# Patient Record
Sex: Female | Born: 1935 | Race: White | Hispanic: No | Smoking: Current every day smoker
Health system: Southern US, Community
[De-identification: ages and names within clinical notes are randomized; demographics above are authoritative.]

## PROBLEM LIST (undated history)

## (undated) DIAGNOSIS — I739 Peripheral vascular disease, unspecified: Secondary | ICD-10-CM

## (undated) DIAGNOSIS — E1140 Type 2 diabetes mellitus with diabetic neuropathy, unspecified: Secondary | ICD-10-CM

## (undated) DIAGNOSIS — F329 Major depressive disorder, single episode, unspecified: Secondary | ICD-10-CM

## (undated) DIAGNOSIS — N189 Chronic kidney disease, unspecified: Secondary | ICD-10-CM

## (undated) DIAGNOSIS — I519 Heart disease, unspecified: Secondary | ICD-10-CM

## (undated) DIAGNOSIS — E119 Type 2 diabetes mellitus without complications: Secondary | ICD-10-CM

## (undated) DIAGNOSIS — E039 Hypothyroidism, unspecified: Secondary | ICD-10-CM

## (undated) DIAGNOSIS — D631 Anemia in chronic kidney disease: Secondary | ICD-10-CM

## (undated) DIAGNOSIS — J449 Chronic obstructive pulmonary disease, unspecified: Secondary | ICD-10-CM

## (undated) DIAGNOSIS — J45909 Unspecified asthma, uncomplicated: Secondary | ICD-10-CM

## (undated) DIAGNOSIS — E785 Hyperlipidemia, unspecified: Secondary | ICD-10-CM

## (undated) DIAGNOSIS — I10 Essential (primary) hypertension: Secondary | ICD-10-CM

## (undated) DIAGNOSIS — F419 Anxiety disorder, unspecified: Secondary | ICD-10-CM

## (undated) DIAGNOSIS — J439 Emphysema, unspecified: Secondary | ICD-10-CM

## (undated) DIAGNOSIS — G459 Transient cerebral ischemic attack, unspecified: Secondary | ICD-10-CM

## (undated) DIAGNOSIS — I4891 Unspecified atrial fibrillation: Secondary | ICD-10-CM

## (undated) DIAGNOSIS — F32A Depression, unspecified: Secondary | ICD-10-CM

## (undated) DIAGNOSIS — I2510 Atherosclerotic heart disease of native coronary artery without angina pectoris: Secondary | ICD-10-CM

## (undated) DIAGNOSIS — M1990 Unspecified osteoarthritis, unspecified site: Secondary | ICD-10-CM

## (undated) DIAGNOSIS — Z89432 Acquired absence of left foot: Secondary | ICD-10-CM

## (undated) DIAGNOSIS — I509 Heart failure, unspecified: Secondary | ICD-10-CM

## (undated) DIAGNOSIS — G3184 Mild cognitive impairment, so stated: Secondary | ICD-10-CM

## (undated) HISTORY — PX: JOINT REPLACEMENT: SHX530

## (undated) HISTORY — PX: HIP FRACTURE SURGERY: SHX118

## (undated) HISTORY — DX: Depression, unspecified: F32.A

## (undated) HISTORY — PX: APPENDECTOMY: SHX54

## (undated) HISTORY — DX: Chronic obstructive pulmonary disease, unspecified: J44.9

## (undated) HISTORY — DX: Unspecified osteoarthritis, unspecified site: M19.90

## (undated) HISTORY — DX: Hyperlipidemia, unspecified: E78.5

## (undated) HISTORY — DX: Heart disease, unspecified: I51.9

## (undated) HISTORY — DX: Major depressive disorder, single episode, unspecified: F32.9

## (undated) HISTORY — DX: Hypothyroidism, unspecified: E03.9

## (undated) HISTORY — DX: Atherosclerotic heart disease of native coronary artery without angina pectoris: I25.10

## (undated) HISTORY — DX: Anxiety disorder, unspecified: F41.9

---

## 1998-03-24 ENCOUNTER — Emergency Department (HOSPITAL_COMMUNITY): Admission: EM | Admit: 1998-03-24 | Discharge: 1998-03-24 | Payer: Self-pay | Admitting: Emergency Medicine

## 1998-05-01 ENCOUNTER — Ambulatory Visit (HOSPITAL_COMMUNITY): Admission: RE | Admit: 1998-05-01 | Discharge: 1998-05-01 | Payer: Self-pay | Admitting: *Deleted

## 1998-05-13 ENCOUNTER — Emergency Department (HOSPITAL_COMMUNITY): Admission: EM | Admit: 1998-05-13 | Discharge: 1998-05-13 | Payer: Self-pay | Admitting: Emergency Medicine

## 2004-06-22 HISTORY — PX: EYE SURGERY: SHX253

## 2004-08-04 ENCOUNTER — Emergency Department (HOSPITAL_COMMUNITY): Admission: EM | Admit: 2004-08-04 | Discharge: 2004-08-04 | Payer: Self-pay | Admitting: Emergency Medicine

## 2004-08-07 ENCOUNTER — Ambulatory Visit (HOSPITAL_COMMUNITY): Admission: RE | Admit: 2004-08-07 | Discharge: 2004-08-08 | Payer: Self-pay | Admitting: Ophthalmology

## 2011-07-09 ENCOUNTER — Encounter (INDEPENDENT_AMBULATORY_CARE_PROVIDER_SITE_OTHER): Payer: Medicare PPO | Admitting: Ophthalmology

## 2011-07-09 DIAGNOSIS — E11359 Type 2 diabetes mellitus with proliferative diabetic retinopathy without macular edema: Secondary | ICD-10-CM

## 2011-07-09 DIAGNOSIS — H43819 Vitreous degeneration, unspecified eye: Secondary | ICD-10-CM

## 2011-07-09 DIAGNOSIS — E1165 Type 2 diabetes mellitus with hyperglycemia: Secondary | ICD-10-CM

## 2012-07-12 ENCOUNTER — Ambulatory Visit (INDEPENDENT_AMBULATORY_CARE_PROVIDER_SITE_OTHER): Payer: Medicare PPO | Admitting: Ophthalmology

## 2012-09-27 ENCOUNTER — Ambulatory Visit (INDEPENDENT_AMBULATORY_CARE_PROVIDER_SITE_OTHER): Payer: Self-pay | Admitting: Ophthalmology

## 2012-09-27 DIAGNOSIS — H43819 Vitreous degeneration, unspecified eye: Secondary | ICD-10-CM

## 2012-09-27 DIAGNOSIS — E1139 Type 2 diabetes mellitus with other diabetic ophthalmic complication: Secondary | ICD-10-CM

## 2012-09-27 DIAGNOSIS — E11359 Type 2 diabetes mellitus with proliferative diabetic retinopathy without macular edema: Secondary | ICD-10-CM

## 2012-09-27 DIAGNOSIS — H35039 Hypertensive retinopathy, unspecified eye: Secondary | ICD-10-CM

## 2012-09-27 DIAGNOSIS — I10 Essential (primary) hypertension: Secondary | ICD-10-CM

## 2013-10-02 ENCOUNTER — Ambulatory Visit (INDEPENDENT_AMBULATORY_CARE_PROVIDER_SITE_OTHER): Payer: Medicare Other | Admitting: Ophthalmology

## 2013-10-02 DIAGNOSIS — H43819 Vitreous degeneration, unspecified eye: Secondary | ICD-10-CM

## 2013-10-02 DIAGNOSIS — I10 Essential (primary) hypertension: Secondary | ICD-10-CM

## 2013-10-02 DIAGNOSIS — E11359 Type 2 diabetes mellitus with proliferative diabetic retinopathy without macular edema: Secondary | ICD-10-CM

## 2013-10-02 DIAGNOSIS — H35039 Hypertensive retinopathy, unspecified eye: Secondary | ICD-10-CM

## 2013-10-02 DIAGNOSIS — E1139 Type 2 diabetes mellitus with other diabetic ophthalmic complication: Secondary | ICD-10-CM

## 2013-10-02 DIAGNOSIS — E1165 Type 2 diabetes mellitus with hyperglycemia: Secondary | ICD-10-CM

## 2014-08-18 ENCOUNTER — Emergency Department (HOSPITAL_COMMUNITY)
Admission: EM | Admit: 2014-08-18 | Discharge: 2014-08-18 | Disposition: A | Discharge status: Home / Self Care | Payer: Medicare Other | Source: Home / Self Care | Attending: Emergency Medicine | Admitting: Emergency Medicine

## 2014-08-18 ENCOUNTER — Encounter (HOSPITAL_COMMUNITY): Payer: Self-pay | Admitting: Emergency Medicine

## 2014-08-18 DIAGNOSIS — I890 Lymphedema, not elsewhere classified: Secondary | ICD-10-CM

## 2014-08-18 DIAGNOSIS — Z23 Encounter for immunization: Secondary | ICD-10-CM

## 2014-08-18 HISTORY — DX: Unspecified asthma, uncomplicated: J45.909

## 2014-08-18 HISTORY — DX: Essential (primary) hypertension: I10

## 2014-08-18 HISTORY — DX: Type 2 diabetes mellitus without complications: E11.9

## 2014-08-18 MED ORDER — TETANUS-DIPHTH-ACELL PERTUSSIS 5-2.5-18.5 LF-MCG/0.5 IM SUSP
INTRAMUSCULAR | Status: AC
  Filled 2014-08-18: qty 0.5

## 2014-08-18 MED ORDER — TETANUS-DIPHTH-ACELL PERTUSSIS 5-2.5-18.5 LF-MCG/0.5 IM SUSP
0.5000 mL | Freq: Once | INTRAMUSCULAR | Status: AC
  Administered 2014-08-18: 0.5 mL via INTRAMUSCULAR

## 2014-08-18 MED ORDER — CEPHALEXIN 500 MG PO CAPS: 500.0000 mg | ORAL_CAPSULE | Freq: Three times a day (TID) | ORAL | Status: DC

## 2014-08-18 NOTE — ED Notes (Signed)
Bilateral legs red, scaly, scabbing, and drainage.  Family has noticed this for 2-3 weeks

## 2014-08-18 NOTE — ED Provider Notes (Signed)
CSN: [id number]     Arrival date & time 08/18/14  1116 History   First MD Initiated Contact with Patient 08/18/14 1230     Chief Complaint  Patient presents with  . Leg Swelling   (Consider location/radiation/quality/duration/timing/severity/associated sxs/prior Treatment) HPI Comments: Patient presents with chronic bilateral pedal edema/lymphedema with associated weeping of skin, skin breakdown/ulceration, venous stasis discoloration and thickened lichenified plaques. These skin issues have been present for many years. Patient is brought by her family as they are concerned that issue is not being addressed by her endocrinologist. States she sees her endocrinologist every 6 months, however, current reason for presentation has not been evaluated by endocrinologist. Patient denies having a primary care provider.  Last tetanus booster unknown.   The history is provided by the patient and a relative.    Past Medical History  Diagnosis Date  . Hypertension   . Diabetes mellitus without complication   . Asthma    History reviewed. No pertinent past surgical history. No family history on file. History  Substance Use Topics  . Smoking status: Current Every Day Smoker  . Smokeless tobacco: Not on file  . Alcohol Use: Yes   OB History    No data available     Review of Systems  Constitutional: Negative for fever and chills.  Respiratory: Negative for cough, chest tightness, shortness of breath and wheezing.   Cardiovascular: Negative.   Gastrointestinal: Negative.   Skin:       See HPI  All other systems reviewed and are negative.   Allergies  Review of patient's allergies indicates no known allergies.  Home Medications   Prior to Admission medications   Medication Sig Start Date End Date Taking? Authorizing Provider  ALBUTEROL IN Inhale into the lungs.   Yes Historical Provider, MD  Ascorbic Acid (VITAMIN C PO) Take by mouth.   Yes Historical Provider, MD  Cholecalciferol  (VITAMIN D PO) Take by mouth.   Yes Historical Provider, MD  CLONIDINE HCL PO Take by mouth.   Yes Historical Provider, MD  LEVOTHYROXINE SODIUM PO Take by mouth.   Yes Historical Provider, MD  LISINOPRIL PO Take by mouth.   Yes Historical Provider, MD  Multiple Vitamin (MULTIVITAMIN) capsule Take 1 capsule by mouth daily.   Yes Historical Provider, MD  cephALEXin (KEFLEX) 500 MG capsule Take 1 capsule (500 mg total) by mouth 3 (three) times daily. 08/18/14   Jess Barters H Melony Tenpas, PA   BP 158/71 mmHg  Pulse 76  Temp(Src) 97.5 F (36.4 C) (Oral)  Resp 16  SpO2 98% Physical Exam  Constitutional: She is oriented to person, place, and time. She appears well-developed and well-nourished. No distress.  HENT:  Head: Normocephalic and atraumatic.  Cardiovascular: Normal rate.   Pulmonary/Chest: Effort normal.  Neurological: She is alert and oriented to person, place, and time.  Skin:  +weeping 3+ pitting lymphedema of bilateral lower legs with small scattered areas of blistering, ulceration, venous stasis dermatitis and thick lichenified plaques. Scattered areas of erythema without induration and with mild tenderness  Psychiatric: She has a normal mood and affect. Her behavior is normal.  Nursing note and vitals reviewed.   ED Course  Procedures (including critical care time) Labs Review Labs Reviewed - No data to display  Imaging Review No results found.   MDM   1. Lymphedema of both lower extremities    Advised to  keep your skin as clean and as dry wearing long white cotton tube socks with replacement  with fresh socks several times a day. Cephalexin as directed. [facility] requesting that patient becontacted for an appointment at this facility. Advised of importance of locating a primary care doctor to assist in medical management. Given patien a printed sheet listing the primary care doctors in the same group as  endocrinologist. given a tetanus booster here in the clinic today. If she has not heard from the [facility] in the next several days, advised to call for an appointment.     Ria ClockJennifer Lee H Anelisse Jacobson, [place] 08/18/14 [phone]

## 2014-08-18 NOTE — Discharge Instructions (Signed)
Please keep your skin as clean and as dry as you can. Take medication as directed. I have sent an electronic referral to [facility] requesting that you be contacted for an appointment at this facility. In addition, it is very important that you locate a primary care doctor to assist in your medical management. I have given you a printed sheet listing the primary care doctors in the same group as your endocrinologist. You have been given a tetanus booster here in the clinic today. If you have not heard from the [facility] in the next several days, please call for an appointment.  Lymphedema Lymphedema is a swelling caused by the abnormal collection of lymph under the skin. The lymph is fluid from the tissues in your body that travels in the lymphatic system. This system is part of the immune system that includes lymph nodes and vessels. The lymph vessels collect and carry the excess fluid, fats, proteins, and wastes from the tissues of the body to the bloodstream. This system also works to clean and remove bacteria and waste products from the body.  Lymphedema occurs when the lymphatic system is blocked. When the lymph vessels or lymph nodes are blocked or damaged, lymph does not drain properly. This causes abnormal build up of lymph. This leads to swelling in the arms or legs. Lymphedema cannot be cured by medicines. But the swelling can be reduced by physical methods. CAUSES  There are two types of lymphedema. Primary lymphedema is caused by the absence or abnormality of the lymph vessel at birth. It is also known as inherited lymphedema, which occurs rarely. Secondary or acquired lymphedema occurs when the lymph vessel is damaged or blocked. The causes of lymph vessel blockage are:   Skin infection like cellulites.  Infection by parasites (filariasis).  Injury.  Cancer.  Radiation therapy.  Formation of scar tissue.  Surgery. SYMPTOMS  The symptoms of lymphedema  are:  Abnormal swelling of the arm or leg.  Heavy or tight feeling in your arm or leg.  Tight-fitting shoes or rings.  Redness of skin over the affected area.  Limited movement of the affected limb.  Some patients complain about sensitivity to touch and discomfort in the limb(s) affected. You may not have these symptoms immediately following injury. They usually appear within a few days or even years after injury. Inform your caregiver, if you have any of these symptoms. Early treatment can avoid further problems.  DIAGNOSIS  First, your caregiver will inquire about any surgery you have had or medicines you are taking. He will then examine you. Your caregiver may order special imaging tests, such as:  Lymphoscintigraphy (a test in which a low dose of radioactive substance is injected to trace the flow of lymph through the lymph vessels).  MRI (imaging tests using magnetic fields).  Computed tomography (test using special cross-sectional X-rays).  Duplex ultrasound (test using high-frequency sound waves to show the vessels and the blood flow on a screen).  Lymphangiography (special X-ray taken after injecting a contrast dye into the lymph vessel). It is now rarely done. TREATMENT  Lymphedema can be treated in different ways. Your caregiver will decide the type of treatment depending on the cause. Treatment may include:  Exercise: Special exercises will help fluid move out easily from the affected part. This should be done as per your caregiver's advice.  Manual lymph drainage: Gentle massage of the affected limb makes the fluid to move out more freely.  Compression:  Compression stockings or external pump apply pressure over the affected limb. This helps the fluid to move out from the arm or leg. Bandaging can also help to move the fluid out from the affected part. Your caregiver will decide the method that suits you the best.  Medicines: Your caregiver may prescribe antibiotics, if  you have infection.  Surgery: Your caregiver may advise surgery for severe lymphedema. It is reserved for special cases when the patient has difficulty moving. Your surgeon may remove excess tissue from the arm or leg. This will help to ease your movement. Physical therapy may have to be continued after surgery. HOME CARE INSTRUCTIONS  The area is very fragile and is predisposed to injury and infection.  Eat a healthy diet.  Exercise regularly as per advice.  Keep the affected area clean and dry.  Use gloves while cooking or gardening.  Protect your skin from cuts.  Use electric razor to shave the affected area.  Keep affected limb elevated.  Do not wear tight clothes, shoes, or jewelry as it may cause the tissue to be strangled.  Do not use heat pads over the affected area.  Do not sit with cross legs.  Do not walk barefoot.  Do not carry weight on the affected arm.  Avoid having blood pressure checked on the affected limb. SEEK MEDICAL CARE IF:  You continue to have swelling in your limb. SEEK IMMEDIATE MEDICAL CARE IF:   You have high fever.  You have skin rash.  You have chills or sweats.  You have pain or redness.  You have a cut that does not heal. MAKE SURE YOU:   Understand these instructions.  Will watch your condition.  Will get help right away if you are not doing well or get worse. Document Released: 04/05/2007 Document Revised: 05/25/2012 Document Reviewed: 03/11/2009 [facility] Patient Information 2015 [place], [place]. This information is not intended to replace advice given to you by your health care provider. Make sure you discuss any questions you have with your health care provider.

## 2014-09-18 ENCOUNTER — Encounter (HOSPITAL_BASED_OUTPATIENT_CLINIC_OR_DEPARTMENT_OTHER): Payer: Medicare Other | Attending: General Surgery

## 2014-09-18 DIAGNOSIS — E11319 Type 2 diabetes mellitus with unspecified diabetic retinopathy without macular edema: Secondary | ICD-10-CM | POA: Diagnosis not present

## 2014-09-18 DIAGNOSIS — Z794 Long term (current) use of insulin: Secondary | ICD-10-CM | POA: Diagnosis not present

## 2014-09-18 DIAGNOSIS — L97921 Non-pressure chronic ulcer of unspecified part of left lower leg limited to breakdown of skin: Secondary | ICD-10-CM | POA: Diagnosis not present

## 2014-09-18 DIAGNOSIS — L97911 Non-pressure chronic ulcer of unspecified part of right lower leg limited to breakdown of skin: Secondary | ICD-10-CM | POA: Insufficient documentation

## 2014-09-18 DIAGNOSIS — E1140 Type 2 diabetes mellitus with diabetic neuropathy, unspecified: Secondary | ICD-10-CM | POA: Insufficient documentation

## 2014-09-18 DIAGNOSIS — E11622 Type 2 diabetes mellitus with other skin ulcer: Secondary | ICD-10-CM | POA: Diagnosis not present

## 2014-09-25 ENCOUNTER — Encounter (HOSPITAL_BASED_OUTPATIENT_CLINIC_OR_DEPARTMENT_OTHER): Payer: Medicare Other | Attending: General Surgery

## 2014-09-25 DIAGNOSIS — L97921 Non-pressure chronic ulcer of unspecified part of left lower leg limited to breakdown of skin: Secondary | ICD-10-CM | POA: Insufficient documentation

## 2014-09-25 DIAGNOSIS — L97911 Non-pressure chronic ulcer of unspecified part of right lower leg limited to breakdown of skin: Secondary | ICD-10-CM | POA: Insufficient documentation

## 2014-09-25 DIAGNOSIS — J449 Chronic obstructive pulmonary disease, unspecified: Secondary | ICD-10-CM | POA: Diagnosis not present

## 2014-09-25 DIAGNOSIS — E1140 Type 2 diabetes mellitus with diabetic neuropathy, unspecified: Secondary | ICD-10-CM | POA: Diagnosis not present

## 2014-09-25 DIAGNOSIS — E11319 Type 2 diabetes mellitus with unspecified diabetic retinopathy without macular edema: Secondary | ICD-10-CM | POA: Insufficient documentation

## 2014-09-25 DIAGNOSIS — E11622 Type 2 diabetes mellitus with other skin ulcer: Secondary | ICD-10-CM | POA: Diagnosis not present

## 2014-09-25 DIAGNOSIS — J45909 Unspecified asthma, uncomplicated: Secondary | ICD-10-CM | POA: Diagnosis not present

## 2014-09-28 ENCOUNTER — Other Ambulatory Visit (HOSPITAL_BASED_OUTPATIENT_CLINIC_OR_DEPARTMENT_OTHER): Payer: Self-pay | Admitting: General Surgery

## 2014-09-28 ENCOUNTER — Ambulatory Visit (HOSPITAL_COMMUNITY)
Admission: RE | Admit: 2014-09-28 | Discharge: 2014-09-28 | Disposition: A | Discharge status: Home / Self Care | Payer: Medicare Other | Source: Ambulatory Visit | Attending: Vascular Surgery | Admitting: Vascular Surgery

## 2014-09-28 DIAGNOSIS — L97919 Non-pressure chronic ulcer of unspecified part of right lower leg with unspecified severity: Secondary | ICD-10-CM

## 2014-09-28 DIAGNOSIS — L97929 Non-pressure chronic ulcer of unspecified part of left lower leg with unspecified severity: Principal | ICD-10-CM

## 2014-09-28 DIAGNOSIS — I10 Essential (primary) hypertension: Secondary | ICD-10-CM | POA: Insufficient documentation

## 2014-09-28 DIAGNOSIS — F17200 Nicotine dependence, unspecified, uncomplicated: Secondary | ICD-10-CM | POA: Diagnosis not present

## 2014-09-28 DIAGNOSIS — E119 Type 2 diabetes mellitus without complications: Secondary | ICD-10-CM | POA: Diagnosis not present

## 2014-10-02 DIAGNOSIS — E11622 Type 2 diabetes mellitus with other skin ulcer: Secondary | ICD-10-CM | POA: Diagnosis not present

## 2014-10-02 DIAGNOSIS — L97921 Non-pressure chronic ulcer of unspecified part of left lower leg limited to breakdown of skin: Secondary | ICD-10-CM | POA: Diagnosis not present

## 2014-10-02 DIAGNOSIS — E1140 Type 2 diabetes mellitus with diabetic neuropathy, unspecified: Secondary | ICD-10-CM | POA: Diagnosis not present

## 2014-10-02 DIAGNOSIS — E11319 Type 2 diabetes mellitus with unspecified diabetic retinopathy without macular edema: Secondary | ICD-10-CM | POA: Diagnosis not present

## 2014-10-16 DIAGNOSIS — L97921 Non-pressure chronic ulcer of unspecified part of left lower leg limited to breakdown of skin: Secondary | ICD-10-CM | POA: Diagnosis not present

## 2014-10-16 DIAGNOSIS — E11319 Type 2 diabetes mellitus with unspecified diabetic retinopathy without macular edema: Secondary | ICD-10-CM | POA: Diagnosis not present

## 2014-10-16 DIAGNOSIS — E1140 Type 2 diabetes mellitus with diabetic neuropathy, unspecified: Secondary | ICD-10-CM | POA: Diagnosis not present

## 2014-10-16 DIAGNOSIS — E11622 Type 2 diabetes mellitus with other skin ulcer: Secondary | ICD-10-CM | POA: Diagnosis not present

## 2014-10-17 ENCOUNTER — Other Ambulatory Visit: Payer: Self-pay | Admitting: *Deleted

## 2014-10-17 DIAGNOSIS — I739 Peripheral vascular disease, unspecified: Secondary | ICD-10-CM

## 2014-10-30 ENCOUNTER — Encounter (HOSPITAL_BASED_OUTPATIENT_CLINIC_OR_DEPARTMENT_OTHER): Payer: Medicare Other | Attending: General Surgery

## 2014-10-30 DIAGNOSIS — E11622 Type 2 diabetes mellitus with other skin ulcer: Secondary | ICD-10-CM | POA: Insufficient documentation

## 2014-10-30 DIAGNOSIS — E1140 Type 2 diabetes mellitus with diabetic neuropathy, unspecified: Secondary | ICD-10-CM | POA: Diagnosis not present

## 2014-10-30 DIAGNOSIS — E11319 Type 2 diabetes mellitus with unspecified diabetic retinopathy without macular edema: Secondary | ICD-10-CM | POA: Diagnosis not present

## 2014-10-30 DIAGNOSIS — I878 Other specified disorders of veins: Secondary | ICD-10-CM | POA: Insufficient documentation

## 2014-11-02 ENCOUNTER — Encounter (HOSPITAL_COMMUNITY): Payer: Medicare Other

## 2014-11-02 ENCOUNTER — Encounter: Payer: Medicare Other | Admitting: Vascular Surgery

## 2015-01-16 ENCOUNTER — Ambulatory Visit (INDEPENDENT_AMBULATORY_CARE_PROVIDER_SITE_OTHER): Payer: Medicare Other | Admitting: Ophthalmology

## 2015-02-05 ENCOUNTER — Emergency Department (HOSPITAL_COMMUNITY): Payer: Medicare Other

## 2015-02-05 ENCOUNTER — Inpatient Hospital Stay (HOSPITAL_COMMUNITY): Admit: 2015-02-05 | Payer: Medicare Other

## 2015-02-05 ENCOUNTER — Inpatient Hospital Stay (HOSPITAL_COMMUNITY): Payer: Medicare Other

## 2015-02-05 ENCOUNTER — Encounter (HOSPITAL_COMMUNITY)
Admission: EM | Disposition: A | Discharge status: Skilled Nursing Facility | Payer: Self-pay | Source: Home / Self Care | Attending: Internal Medicine

## 2015-02-05 ENCOUNTER — Inpatient Hospital Stay (HOSPITAL_COMMUNITY)
Admission: EM | Admit: 2015-02-05 | Discharge: 2015-02-08 | DRG: 481 | Disposition: A | Discharge status: Skilled Nursing Facility | Payer: Medicare Other | Attending: Internal Medicine | Admitting: Internal Medicine

## 2015-02-05 ENCOUNTER — Encounter (HOSPITAL_COMMUNITY): Payer: Self-pay | Admitting: Emergency Medicine

## 2015-02-05 DIAGNOSIS — Z794 Long term (current) use of insulin: Secondary | ICD-10-CM | POA: Diagnosis not present

## 2015-02-05 DIAGNOSIS — F17210 Nicotine dependence, cigarettes, uncomplicated: Secondary | ICD-10-CM | POA: Diagnosis present

## 2015-02-05 DIAGNOSIS — R9431 Abnormal electrocardiogram [ECG] [EKG]: Secondary | ICD-10-CM | POA: Diagnosis not present

## 2015-02-05 DIAGNOSIS — E1165 Type 2 diabetes mellitus with hyperglycemia: Secondary | ICD-10-CM

## 2015-02-05 DIAGNOSIS — Z7982 Long term (current) use of aspirin: Secondary | ICD-10-CM

## 2015-02-05 DIAGNOSIS — S72141A Displaced intertrochanteric fracture of right femur, initial encounter for closed fracture: Principal | ICD-10-CM | POA: Diagnosis present

## 2015-02-05 DIAGNOSIS — Z9119 Patient's noncompliance with other medical treatment and regimen: Secondary | ICD-10-CM | POA: Diagnosis present

## 2015-02-05 DIAGNOSIS — R0989 Other specified symptoms and signs involving the circulatory and respiratory systems: Secondary | ICD-10-CM

## 2015-02-05 DIAGNOSIS — J45909 Unspecified asthma, uncomplicated: Secondary | ICD-10-CM | POA: Diagnosis present

## 2015-02-05 DIAGNOSIS — M25551 Pain in right hip: Secondary | ICD-10-CM | POA: Diagnosis present

## 2015-02-05 DIAGNOSIS — I2510 Atherosclerotic heart disease of native coronary artery without angina pectoris: Secondary | ICD-10-CM | POA: Diagnosis present

## 2015-02-05 DIAGNOSIS — Z720 Tobacco use: Secondary | ICD-10-CM | POA: Diagnosis present

## 2015-02-05 DIAGNOSIS — S0990XA Unspecified injury of head, initial encounter: Secondary | ICD-10-CM | POA: Diagnosis present

## 2015-02-05 DIAGNOSIS — S72001K Fracture of unspecified part of neck of right femur, subsequent encounter for closed fracture with nonunion: Secondary | ICD-10-CM | POA: Diagnosis not present

## 2015-02-05 DIAGNOSIS — S72001A Fracture of unspecified part of neck of right femur, initial encounter for closed fracture: Secondary | ICD-10-CM | POA: Diagnosis not present

## 2015-02-05 DIAGNOSIS — I10 Essential (primary) hypertension: Secondary | ICD-10-CM | POA: Diagnosis present

## 2015-02-05 DIAGNOSIS — D62 Acute posthemorrhagic anemia: Secondary | ICD-10-CM | POA: Diagnosis not present

## 2015-02-05 DIAGNOSIS — S72009A Fracture of unspecified part of neck of unspecified femur, initial encounter for closed fracture: Secondary | ICD-10-CM | POA: Diagnosis present

## 2015-02-05 DIAGNOSIS — F1020 Alcohol dependence, uncomplicated: Secondary | ICD-10-CM | POA: Diagnosis present

## 2015-02-05 DIAGNOSIS — E039 Hypothyroidism, unspecified: Secondary | ICD-10-CM | POA: Diagnosis present

## 2015-02-05 DIAGNOSIS — Z419 Encounter for procedure for purposes other than remedying health state, unspecified: Secondary | ICD-10-CM

## 2015-02-05 DIAGNOSIS — W1830XA Fall on same level, unspecified, initial encounter: Secondary | ICD-10-CM | POA: Diagnosis present

## 2015-02-05 DIAGNOSIS — E119 Type 2 diabetes mellitus without complications: Secondary | ICD-10-CM | POA: Diagnosis not present

## 2015-02-05 DIAGNOSIS — Z66 Do not resuscitate: Secondary | ICD-10-CM | POA: Diagnosis present

## 2015-02-05 DIAGNOSIS — Z79899 Other long term (current) drug therapy: Secondary | ICD-10-CM | POA: Diagnosis not present

## 2015-02-05 DIAGNOSIS — S72001G Fracture of unspecified part of neck of right femur, subsequent encounter for closed fracture with delayed healing: Secondary | ICD-10-CM | POA: Diagnosis not present

## 2015-02-05 DIAGNOSIS — Z01818 Encounter for other preprocedural examination: Secondary | ICD-10-CM

## 2015-02-05 DIAGNOSIS — S72001D Fracture of unspecified part of neck of right femur, subsequent encounter for closed fracture with routine healing: Secondary | ICD-10-CM | POA: Diagnosis not present

## 2015-02-05 HISTORY — PX: CARDIAC CATHETERIZATION: SHX172

## 2015-02-05 LAB — CBC WITH DIFFERENTIAL/PLATELET
BASOS ABS: 0 10*3/uL (ref 0.0–0.1)
BASOS PCT: 0 % (ref 0–1)
EOS ABS: 0 10*3/uL (ref 0.0–0.7)
EOS PCT: 0 % (ref 0–5)
HCT: 38.6 % (ref 36.0–46.0)
Hemoglobin: 13.1 g/dL (ref 12.0–15.0)
Lymphocytes Relative: 7 % — ABNORMAL LOW (ref 12–46)
Lymphs Abs: 0.9 10*3/uL (ref 0.7–4.0)
MCH: 33.2 pg (ref 26.0–34.0)
MCHC: 33.9 g/dL (ref 30.0–36.0)
MCV: 97.7 fL (ref 78.0–100.0)
Monocytes Absolute: 0.7 10*3/uL (ref 0.1–1.0)
Monocytes Relative: 5 % (ref 3–12)
Neutro Abs: 12.1 10*3/uL — ABNORMAL HIGH (ref 1.7–7.7)
Neutrophils Relative %: 88 % — ABNORMAL HIGH (ref 43–77)
PLATELETS: 202 10*3/uL (ref 150–400)
RBC: 3.95 MIL/uL (ref 3.87–5.11)
RDW: 12.5 % (ref 11.5–15.5)
WBC: 13.8 10*3/uL — AB (ref 4.0–10.5)

## 2015-02-05 LAB — BASIC METABOLIC PANEL
Anion gap: 11 (ref 5–15)
BUN: 11 mg/dL (ref 6–20)
CHLORIDE: 99 mmol/L — AB (ref 101–111)
CO2: 24 mmol/L (ref 22–32)
Calcium: 8.7 mg/dL — ABNORMAL LOW (ref 8.9–10.3)
Creatinine, Ser: 0.59 mg/dL (ref 0.44–1.00)
GFR calc Af Amer: >60 mL/min (ref >60)
GFR calc non Af Amer: >60 mL/min (ref >60)
GLUCOSE: 284 mg/dL — AB (ref 65–99)
POTASSIUM: 3.5 mmol/L (ref 3.5–5.1)
Sodium: 134 mmol/L — ABNORMAL LOW (ref 135–145)

## 2015-02-05 LAB — URINALYSIS, ROUTINE W REFLEX MICROSCOPIC
Bilirubin Urine: NEGATIVE
GLUCOSE, UA: 500 mg/dL — AB
Ketones, ur: 40 mg/dL — AB
LEUKOCYTES UA: NEGATIVE
Nitrite: NEGATIVE
PH: 7.5 (ref 5.0–8.0)
PROTEIN: NEGATIVE mg/dL
SPECIFIC GRAVITY, URINE: 1.014 (ref 1.005–1.030)
Urobilinogen, UA: 0.2 mg/dL (ref 0.0–1.0)

## 2015-02-05 LAB — PROTIME-INR
INR: 1.01 (ref 0.00–1.49)
PROTHROMBIN TIME: 13.5 s (ref 11.6–15.2)

## 2015-02-05 LAB — URINE MICROSCOPIC-ADD ON

## 2015-02-05 LAB — TYPE AND SCREEN
ABO/RH(D): B POS
ANTIBODY SCREEN: NEGATIVE

## 2015-02-05 LAB — GLUCOSE, CAPILLARY
GLUCOSE-CAPILLARY: 328 mg/dL — AB (ref 65–99)
Glucose-Capillary: 223 mg/dL — ABNORMAL HIGH (ref 65–99)
Glucose-Capillary: 165 mg/dL — ABNORMAL HIGH (ref 65–99)

## 2015-02-05 LAB — ABO/RH: ABO/RH(D): B POS

## 2015-02-05 LAB — CBG MONITORING, ED: Glucose-Capillary: 301 mg/dL — ABNORMAL HIGH (ref 65–99)

## 2015-02-05 LAB — POCT ACTIVATED CLOTTING TIME: ACTIVATED CLOTTING TIME: 319 s

## 2015-02-05 SURGERY — LEFT HEART CATH AND CORONARY ANGIOGRAPHY

## 2015-02-05 MED ORDER — PROMETHAZINE HCL 25 MG/ML IJ SOLN: 12.5000 mg | Freq: Four times a day (QID) | INTRAMUSCULAR | Status: DC | PRN

## 2015-02-05 MED ORDER — ONDANSETRON HCL 4 MG/2ML IJ SOLN
4.0000 mg | Freq: Four times a day (QID) | INTRAMUSCULAR | Status: DC | PRN
  Administered 2015-02-05: 4 mg via INTRAVENOUS
  Filled 2015-02-05: qty 2

## 2015-02-05 MED ORDER — HYDROMORPHONE HCL 1 MG/ML IJ SOLN
1.0000 mg | Freq: Once | INTRAMUSCULAR | Status: AC
  Administered 2015-02-05: 1 mg via INTRAVENOUS
  Filled 2015-02-05: qty 1

## 2015-02-05 MED ORDER — SODIUM CHLORIDE 0.9 % IV SOLN
INTRAVENOUS | Status: AC
  Administered 2015-02-05: 13:00:00 via INTRAVENOUS

## 2015-02-05 MED ORDER — LIDOCAINE HCL (PF) 1 % IJ SOLN
INTRAMUSCULAR | Status: DC | PRN
  Administered 2015-02-05: 2 mL via SUBCUTANEOUS

## 2015-02-05 MED ORDER — ALBUTEROL SULFATE (2.5 MG/3ML) 0.083% IN NEBU
2.5000 mg | INHALATION_SOLUTION | RESPIRATORY_TRACT | Status: DC | PRN
  Administered 2015-02-06: 2.5 mg via RESPIRATORY_TRACT
  Filled 2015-02-05: qty 3

## 2015-02-05 MED ORDER — HYDROMORPHONE HCL 1 MG/ML IJ SOLN
0.5000 mg | INTRAMUSCULAR | Status: DC | PRN
  Administered 2015-02-05: 0.5 mg via INTRAVENOUS
  Filled 2015-02-05: qty 1

## 2015-02-05 MED ORDER — SODIUM CHLORIDE 0.9 % IJ SOLN
3.0000 mL | INTRAMUSCULAR | Status: DC | PRN
Start: 2015-02-05 — End: 2015-02-05

## 2015-02-05 MED ORDER — VERAPAMIL HCL 2.5 MG/ML IV SOLN
INTRAVENOUS | Status: DC | PRN
  Administered 2015-02-05: 12:00:00 via INTRA_ARTERIAL

## 2015-02-05 MED ORDER — FENTANYL CITRATE (PF) 100 MCG/2ML IJ SOLN
INTRAMUSCULAR | Status: AC
  Filled 2015-02-05: qty 4

## 2015-02-05 MED ORDER — LIDOCAINE HCL (PF) 1 % IJ SOLN
INTRAMUSCULAR | Status: DC | PRN
  Administered 2015-02-05: 12:00:00

## 2015-02-05 MED ORDER — HYDROCODONE-ACETAMINOPHEN 5-325 MG PO TABS
1.0000 | ORAL_TABLET | Freq: Four times a day (QID) | ORAL | Status: DC | PRN
  Administered 2015-02-06: 1 via ORAL
  Filled 2015-02-05: qty 1

## 2015-02-05 MED ORDER — FENTANYL CITRATE (PF) 100 MCG/2ML IJ SOLN
INTRAMUSCULAR | Status: DC | PRN
  Administered 2015-02-05: 25 ug via INTRAVENOUS

## 2015-02-05 MED ORDER — LIDOCAINE HCL (PF) 1 % IJ SOLN
INTRAMUSCULAR | Status: AC
  Filled 2015-02-05: qty 30

## 2015-02-05 MED ORDER — SODIUM CHLORIDE 0.9 % IV SOLN: 250.0000 mL | INTRAVENOUS | Status: DC | PRN

## 2015-02-05 MED ORDER — VERAPAMIL HCL 2.5 MG/ML IV SOLN
INTRAVENOUS | Status: AC
  Filled 2015-02-05: qty 2

## 2015-02-05 MED ORDER — SODIUM CHLORIDE 0.9 % IV SOLN
INTRAVENOUS | Status: DC
  Administered 2015-02-05: 14:00:00 via INTRAVENOUS

## 2015-02-05 MED ORDER — HYDROMORPHONE HCL 1 MG/ML IJ SOLN
0.5000 mg | INTRAMUSCULAR | Status: DC | PRN
  Filled 2015-02-05: qty 1

## 2015-02-05 MED ORDER — LORAZEPAM 1 MG PO TABS
1.0000 mg | ORAL_TABLET | Freq: Four times a day (QID) | ORAL | Status: DC | PRN
  Administered 2015-02-05 – 2015-02-06 (×2): 1 mg via ORAL
  Filled 2015-02-05 (×4): qty 2

## 2015-02-05 MED ORDER — ADULT MULTIVITAMIN W/MINERALS CH
1.0000 | ORAL_TABLET | Freq: Every day | ORAL | Status: DC
  Administered 2015-02-06 – 2015-02-08 (×3): 1 via ORAL
  Filled 2015-02-05 (×4): qty 1

## 2015-02-05 MED ORDER — NICOTINE 14 MG/24HR TD PT24
14.0000 mg | MEDICATED_PATCH | Freq: Every day | TRANSDERMAL | Status: DC
  Administered 2015-02-05 – 2015-02-08 (×4): 14 mg via TRANSDERMAL
  Filled 2015-02-05 (×4): qty 1

## 2015-02-05 MED ORDER — MIDAZOLAM HCL 2 MG/2ML IJ SOLN
INTRAMUSCULAR | Status: DC | PRN
  Administered 2015-02-05: 1 mg via INTRAVENOUS

## 2015-02-05 MED ORDER — ONDANSETRON HCL 4 MG/2ML IJ SOLN
4.0000 mg | Freq: Once | INTRAMUSCULAR | Status: AC
  Administered 2015-02-05: 4 mg via INTRAVENOUS

## 2015-02-05 MED ORDER — VITAMIN B-1 100 MG PO TABS
100.0000 mg | ORAL_TABLET | Freq: Every day | ORAL | Status: DC
  Administered 2015-02-06 – 2015-02-08 (×3): 100 mg via ORAL
  Filled 2015-02-05 (×4): qty 1

## 2015-02-05 MED ORDER — INSULIN ASPART 100 UNIT/ML ~~LOC~~ SOLN
0.0000 [IU] | SUBCUTANEOUS | Status: DC
  Administered 2015-02-05: 11 [IU] via SUBCUTANEOUS
  Administered 2015-02-05: 5 [IU] via SUBCUTANEOUS
  Administered 2015-02-05 – 2015-02-06 (×2): 3 [IU] via SUBCUTANEOUS
  Administered 2015-02-06 (×3): 5 [IU] via SUBCUTANEOUS
  Administered 2015-02-07: 3 [IU] via SUBCUTANEOUS
  Administered 2015-02-07 (×2): 5 [IU] via SUBCUTANEOUS
  Administered 2015-02-07: 3 [IU] via SUBCUTANEOUS

## 2015-02-05 MED ORDER — SODIUM CHLORIDE 0.9 % IV SOLN
1000.0000 mL | INTRAVENOUS | Status: DC
  Administered 2015-02-05 (×2): 1000 mL via INTRAVENOUS

## 2015-02-05 MED ORDER — CLONIDINE HCL 0.1 MG PO TABS
0.1000 mg | ORAL_TABLET | Freq: Two times a day (BID) | ORAL | Status: DC
  Administered 2015-02-05: 0.1 mg via ORAL
  Filled 2015-02-05 (×3): qty 1

## 2015-02-05 MED ORDER — ADENOSINE (DIAGNOSTIC) 140MCG/KG/MIN
INTRAVENOUS | Status: DC | PRN
  Administered 2015-02-05: 141.337 ug/kg/min via INTRAVENOUS

## 2015-02-05 MED ORDER — INSULIN ASPART 100 UNIT/ML ~~LOC~~ SOLN
SUBCUTANEOUS | Status: AC
  Filled 2015-02-05: qty 1

## 2015-02-05 MED ORDER — ADENOSINE 12 MG/4ML IV SOLN
12.0000 mL | Freq: Once | INTRAVENOUS | Status: DC
  Filled 2015-02-05: qty 12

## 2015-02-05 MED ORDER — HEPARIN SODIUM (PORCINE) 1000 UNIT/ML IJ SOLN
INTRAMUSCULAR | Status: AC
  Filled 2015-02-05: qty 1

## 2015-02-05 MED ORDER — THIAMINE HCL 100 MG/ML IJ SOLN
100.0000 mg | Freq: Every day | INTRAMUSCULAR | Status: DC
  Filled 2015-02-05: qty 1
  Filled 2015-02-05: qty 2
  Filled 2015-02-05: qty 1

## 2015-02-05 MED ORDER — DOCUSATE SODIUM 100 MG PO CAPS
100.0000 mg | ORAL_CAPSULE | Freq: Two times a day (BID) | ORAL | Status: DC
  Administered 2015-02-07 – 2015-02-08 (×2): 100 mg via ORAL
  Filled 2015-02-05 (×5): qty 1

## 2015-02-05 MED ORDER — ACETAMINOPHEN 325 MG PO TABS: 650.0000 mg | ORAL_TABLET | Freq: Four times a day (QID) | ORAL | Status: DC | PRN

## 2015-02-05 MED ORDER — ONDANSETRON HCL 4 MG/2ML IJ SOLN
4.0000 mg | Freq: Once | INTRAMUSCULAR | Status: AC
  Administered 2015-02-05: 4 mg via INTRAVENOUS
  Filled 2015-02-05: qty 2

## 2015-02-05 MED ORDER — HYDROMORPHONE HCL 1 MG/ML IJ SOLN
0.5000 mg | Freq: Once | INTRAMUSCULAR | Status: AC
  Administered 2015-02-05: 0.5 mg via INTRAVENOUS

## 2015-02-05 MED ORDER — POLYETHYLENE GLYCOL 3350 17 G PO PACK: 17.0000 g | PACK | Freq: Every day | ORAL | Status: DC | PRN

## 2015-02-05 MED ORDER — CARVEDILOL 6.25 MG PO TABS
6.2500 mg | ORAL_TABLET | Freq: Two times a day (BID) | ORAL | Status: DC
  Administered 2015-02-05 – 2015-02-08 (×6): 6.25 mg via ORAL
  Filled 2015-02-05: qty 2
  Filled 2015-02-05 (×3): qty 1
  Filled 2015-02-05: qty 2
  Filled 2015-02-05: qty 1

## 2015-02-05 MED ORDER — HEPARIN SODIUM (PORCINE) 1000 UNIT/ML IJ SOLN
INTRAMUSCULAR | Status: DC | PRN
  Administered 2015-02-05: 5000 [IU] via INTRAVENOUS
  Administered 2015-02-05: 3500 [IU] via INTRAVENOUS

## 2015-02-05 MED ORDER — MIDAZOLAM HCL 2 MG/2ML IJ SOLN
INTRAMUSCULAR | Status: AC
  Filled 2015-02-05: qty 4

## 2015-02-05 MED ORDER — FOLIC ACID 1 MG PO TABS
1.0000 mg | ORAL_TABLET | Freq: Every day | ORAL | Status: DC
  Administered 2015-02-06 – 2015-02-08 (×3): 1 mg via ORAL
  Filled 2015-02-05 (×4): qty 1

## 2015-02-05 MED ORDER — LORAZEPAM 2 MG/ML IJ SOLN: 1.0000 mg | Freq: Four times a day (QID) | INTRAMUSCULAR | Status: DC | PRN

## 2015-02-05 MED ORDER — LEVOTHYROXINE SODIUM 25 MCG PO TABS
137.0000 ug | ORAL_TABLET | Freq: Every day | ORAL | Status: DC
  Administered 2015-02-06 – 2015-02-08 (×3): 137 ug via ORAL
  Filled 2015-02-05 (×6): qty 1

## 2015-02-05 MED ORDER — SODIUM CHLORIDE 0.9 % IV SOLN
1000.0000 mL | Freq: Once | INTRAVENOUS | Status: AC
  Administered 2015-02-05: 1000 mL via INTRAVENOUS

## 2015-02-05 MED ORDER — LISINOPRIL 20 MG PO TABS
20.0000 mg | ORAL_TABLET | Freq: Every day | ORAL | Status: DC
  Administered 2015-02-05 – 2015-02-08 (×3): 20 mg via ORAL
  Filled 2015-02-05: qty 1
  Filled 2015-02-05: qty 2
  Filled 2015-02-05: qty 1

## 2015-02-05 MED ORDER — HEPARIN (PORCINE) IN NACL 2-0.9 UNIT/ML-% IJ SOLN
INTRAMUSCULAR | Status: AC
  Filled 2015-02-05: qty 1500

## 2015-02-05 MED ORDER — SODIUM CHLORIDE 0.9 % IJ SOLN: 3.0000 mL | Freq: Two times a day (BID) | INTRAMUSCULAR | Status: DC

## 2015-02-05 SURGICAL SUPPLY — 15 items
CATH INFINITI 5 FR JL3.5 (CATHETERS) ×3 IMPLANT
CATH INFINITI 5FR ANG PIGTAIL (CATHETERS) ×3 IMPLANT
CATH INFINITI JR4 5F (CATHETERS) ×3 IMPLANT
CATH VISTA GUIDE 6FR XBLAD3.5 (CATHETERS) ×2 IMPLANT
DEVICE RAD COMP TR BAND LRG (VASCULAR PRODUCTS) ×3 IMPLANT
GLIDESHEATH SLEND SS 6F .021 (SHEATH) ×3 IMPLANT
GUIDEWIRE PRESSURE COMET II (WIRE) ×2 IMPLANT
KIT ESSENTIALS PG (KITS) ×2 IMPLANT
KIT HEART LEFT (KITS) ×3 IMPLANT
PACK CARDIAC CATHETERIZATION (CUSTOM PROCEDURE TRAY) ×3 IMPLANT
SYR MEDRAD MARK V 150ML (SYRINGE) ×3 IMPLANT
TRANSDUCER W/STOPCOCK (MISCELLANEOUS) ×3 IMPLANT
TUBING CIL FLEX 10 FLL-RA (TUBING) ×3 IMPLANT
WIRE HI TORQ VERSACORE-J 145CM (WIRE) ×2 IMPLANT
WIRE SAFE-T 1.5MM-J .035X260CM (WIRE) ×3 IMPLANT

## 2015-02-05 NOTE — ED Notes (Signed)
[facility] notified of patieant being ready for transportation to [facility] and the patient would probably be going to cath lab when she arrives there. Cardiologist notified Cath Lab of patient's room at [facility].

## 2015-02-05 NOTE — Progress Notes (Signed)
Patient and family called to clarify code status. She wants to remain DNR during this hospitalization. But ok with intubation for surgery. Will change the order.  Tara Flores 5:45 PM

## 2015-02-05 NOTE — ED Provider Notes (Addendum)
CSN: [id number]     Arrival date & time 02/05/15  4098 History   First MD Initiated Contact with Patient 02/05/15 [phone]     Chief Complaint  Patient presents with  . Fall    Patient came from home ([phone] patient was picking something and fell forward and hit head. She also hit knee and hip on her right side.     (Consider location/radiation/quality/duration/timing/severity/associated sxs/prior Treatment) HPI Comments: Pt with hx of DM comes in post mechanical fall at 1 am. Pt called her son who lives nearby. She is unable to ambulate and in severe pain. There is hx of HTN, DM. Not on blood thinners. Pt struck her face with the fall as well. Pt has no pain elsewhere.   Patient is a 79 y.o. female presenting with fall. The history is provided by the patient.  Fall    Past Medical History  Diagnosis Date  . Hypertension   . Diabetes mellitus without complication   . Asthma    History reviewed. No pertinent past surgical history. History reviewed. No pertinent family history. Social History  Substance Use Topics  . Smoking status: Current Every Day Smoker  . Smokeless tobacco: None  . Alcohol Use: Yes   OB History    No data available     Review of Systems  Musculoskeletal: Positive for arthralgias.  All other systems reviewed and are negative.     Allergies  Review of patient's allergies indicates no known allergies.  Home Medications   Prior to Admission medications   Medication Sig Start Date End Date Taking? Authorizing Provider  albuterol (PROVENTIL HFA;VENTOLIN HFA) 108 (90 BASE) MCG/ACT inhaler Inhale 1 puff into the lungs every 6 (six) hours as needed for wheezing or shortness of breath.   Yes Historical Provider, MD  aspirin 325 MG tablet Take 325 mg by mouth every 6 (six) hours as needed for mild pain.   Yes Historical Provider, MD  Cholecalciferol (VITAMIN D PO) Take 2,000 Units by mouth daily.    Yes Historical Provider, MD  cloNIDine  (CATAPRES) 0.2 MG tablet Take 0.2 mg by mouth 2 (two) times daily.   Yes Historical Provider, MD  insulin NPH Human (HUMULIN N,NOVOLIN N) 100 UNIT/ML injection Inject 5-20 Units into the skin 2 (two) times daily before a meal. Take 20 units in the morning 5-6 units taken in the evening   Yes Historical Provider, MD  levothyroxine (SYNTHROID, LEVOTHROID) 137 MCG tablet Take 137 mcg by mouth daily before breakfast.   Yes Historical Provider, MD  lisinopril (PRINIVIL,ZESTRIL) 20 MG tablet Take 20 mg by mouth daily.   Yes Historical Provider, MD  Multiple Vitamins-Minerals (MULTI-BETIC DIABETES) TABS Take 1 tablet by mouth daily.   Yes Historical Provider, MD  nicotine polacrilex (NICORETTE) 4 MG gum Take 4 mg by mouth as needed for smoking cessation.   Yes Historical Provider, MD  ALBUTEROL IN Inhale into the lungs.    Historical Provider, MD  Ascorbic Acid (VITAMIN C PO) Take by mouth.    Historical Provider, MD  cephALEXin (KEFLEX) 500 MG capsule Take 1 capsule (500 mg total) by mouth 3 (three) times daily. Patient not taking: Reported on 02/05/2015 08/18/14   Mathis Fare Presson, PA  CLONIDINE HCL PO Take by mouth.    Historical Provider, MD  LEVOTHYROXINE SODIUM PO Take by mouth.    Historical Provider, MD  LISINOPRIL PO Take by mouth.    Historical Provider, MD  Multiple Vitamin (  MULTIVITAMIN) capsule Take 1 capsule by mouth daily.    Historical Provider, MD   BP 164/67 mmHg  Pulse 86  Temp(Src) 98.4 F (36.9 C) (Oral)  Resp 20  SpO2 97% Physical Exam  Constitutional: She is oriented to person, place, and time. She appears well-developed and well-nourished.  HENT:  Head: Normocephalic and atraumatic.  Eyes: EOM are normal. Pupils are equal, round, and reactive to light.  Neck: Neck supple.  Cardiovascular: Normal rate, regular rhythm and normal heart sounds.   No murmur heard. Pulmonary/Chest: Effort normal. No respiratory distress.  Abdominal: Soft. She exhibits no distension.  There is no tenderness. There is no rebound and no guarding.  Musculoskeletal:  Pt has tenderness over the R hip. RLE is short and foot is facing outwards. OTHERWISE:  Head to toe evaluation shows no hematoma, bleeding of the scalp, no facial abrasions, step offs, crepitus, no tenderness to palpation of the bilateral upper and lower extremities, no gross deformities, no chest tenderness, no pelvic pain.   Neurological: She is alert and oriented to person, place, and time.  Skin: Skin is warm and dry.  Nursing note and vitals reviewed.   ED Course  Procedures (including critical care time) Labs Review Labs Reviewed  CBC WITH DIFFERENTIAL/PLATELET - Abnormal; Notable for the following:    WBC 13.8 (*)    Neutrophils Relative % 88 (*)    Neutro Abs 12.1 (*)    Lymphocytes Relative 7 (*)    All other components within normal limits  BASIC METABOLIC PANEL - Abnormal; Notable for the following:    Sodium 134 (*)    Chloride 99 (*)    Glucose, Bld 284 (*)    Calcium 8.7 (*)    All other components within normal limits  URINALYSIS, ROUTINE W REFLEX MICROSCOPIC (NOT AT [facility]) - Abnormal; Notable for the following:    APPearance CLOUDY (*)    Glucose, UA 500 (*)    Hgb urine dipstick SMALL (*)    Ketones, ur 40 (*)    All other components within normal limits  PROTIME-INR  URINE MICROSCOPIC-ADD ON  TYPE AND SCREEN  ABO/RH    Imaging Review Ct Head Wo Contrast  02/05/2015   CLINICAL DATA:  Fall on blood thinners. Head injury. Initial encounter.  EXAM: CT HEAD WITHOUT CONTRAST  TECHNIQUE: Contiguous axial images were obtained from the base of the skull through the vertex without intravenous contrast.  COMPARISON:  None.  FINDINGS: Skull and Sinuses:Lucency in the right low occipital bone is most consistent with a vascular channel. No acute fracture suspected.  Patchy mucosal thickening in the bilateral paranasal sinuses, greatest in the ethmoids. No sinus effusion.  Orbits: No traumatic  finding.  Bilateral cataract resection.  Brain: No evidence of intracranial injury. No evidence of acute infarction, hemorrhage, hydrocephalus, or mass lesion/mass effect. Chronic small-vessel disease with ischemic gliosis throughout the periventricular white matter. Remote lacunar infarct present in the left centrum semiovale. Cortical atrophy, mild for age.  IMPRESSION: 1. No intracranial injury or fracture. 2. Cortical atrophy and moderate chronic small vessel disease. 3. Chronic sinusitis.   Electronically Signed   By: Marnee Spring M.D.   On: 02/05/2015 06:47   Dg Knee Complete 4 Views Right  02/05/2015   CLINICAL DATA:  Larey Seat while picking up a pillow, landed on RIGHT side. RIGHT knee pain and swelling.  EXAM: RIGHT KNEE - COMPLETE 4+ VIEW  COMPARISON:  None.  FINDINGS: Limited examination due to patient positioning. No acute  fracture deformity. No dislocation. Mild suspected medial compartment osteoarthrosis. Moderate vascular calcifications. Soft tissue planes are nonsuspicious.  IMPRESSION: No acute fracture deformity or dislocation.  Osteopenia, decreasing sensitivity for acute nondisplaced fractures.   Electronically Signed   By: Awilda Metro M.D.   On: 02/05/2015 05:02   Dg Hip Unilat  With Pelvis 2-3 Views Right  02/05/2015   CLINICAL DATA:  Larey Seat while picking up a pillow, landed on RIGHT side. RIGHT knee pain and swelling.  EXAM: DG HIP (WITH OR WITHOUT PELVIS) 2-3V RIGHT  COMPARISON:  None.  FINDINGS: Oblique nondisplaced RIGHT femur intertrochanteric fracture extending through the greater and lesser trochanter. Femoral heads are well formed and located. No dislocation. No destructive bony lesions. Small RIGHT hip effusion. Moderate aortoiliac vascular calcifications.  IMPRESSION: Nondisplaced acute RIGHT femur intertrochanteric fracture. No dislocation.   Electronically Signed   By: Awilda Metro M.D.   On: 02/05/2015 05:05   I, Briyah Wheelwright, personally reviewed and evaluated  these images and lab results as part of my medical decision-making.   EKG Interpretation   Date/Time:  Tuesday February 05 2015 05:27:33 EDT Ventricular Rate:  97 PR Interval:  199 QRS Duration: 81 QT Interval:  374 QTC Calculation: 475 R Axis:   0 Text Interpretation:  Sinus rhythm Consider left atrial enlargement  Anteroseptal infarct, old Repol abnrm suggests ischemia, diffuse leads  inferior and lateral ST depression No old tracing to compare Confirmed by  Rhunette Croft, MD, Janey Genta [phone]) on 02/05/2015 5:37:31 AM      MDM   Final diagnoses:  Fracture neck of femur, right, closed, initial encounter    Pt with a mechanical fall. She has a hip fx. We will admit pt to hospitalist, ortho consulted.   Derwood Kaplan, MD  7:39 AM  Dr. Roda Shutters - pt to be transferred [facility]. Pt to OR tomorrow  02/05/15 6045  Derwood Kaplan, MD 02/05/15 [phone]

## 2015-02-05 NOTE — ED Notes (Signed)
Patient came from home ([phone] patient was picking something and fell forward and hit head. She also hit knee and hip on her right side.

## 2015-02-05 NOTE — ED Notes (Signed)
[facility] notified.   

## 2015-02-05 NOTE — H&P (Addendum)
[facility] History and Physical  Tara Flores QJJ:[id number] DOB: November 21, 1935 DOA: 02/05/2015   PCP: Does not have a PCP Specialists: Followed by Dr. Chalmers Cater with endocrinology who prescribes her other medications as well.  Chief Complaint: Pain in the right hip  HPI: Tara Flores is a 78 y.o. female with a past medical history of diabetes on insulin, hypertension, hypothyroidism, tobacco abuse, alcohol use on a daily basis, who was in her usual state of health until earlier this morning when she bent down to pick up a pillow from the floor, lost her balance and fell. She hit the right side of her face and the right hip on the floor. She couldn't stand up. She had excruciating pain in the right hip. She denies any loss of consciousness. No chest pain or shortness of breath, nausea, vomiting. No dizziness or lightheadedness. No new medications recently. She was brought into the hospital for further management. She was found to have a hip fracture. We were consulted for preoperative evaluation.  Home Medications: Prior to Admission medications   Medication Sig Start Date End Date Taking? Authorizing Provider  albuterol (PROVENTIL HFA;VENTOLIN HFA) 108 (90 BASE) MCG/ACT inhaler Inhale 1 puff into the lungs every 6 (six) hours as needed for wheezing or shortness of breath.   Yes Historical Provider, MD  aspirin 325 MG tablet Take 325 mg by mouth every 6 (six) hours as needed for mild pain.   Yes Historical Provider, MD  Cholecalciferol (VITAMIN D PO) Take 2,000 Units by mouth daily.    Yes Historical Provider, MD  cloNIDine (CATAPRES) 0.2 MG tablet Take 0.2 mg by mouth 2 (two) times daily.   Yes Historical Provider, MD  insulin NPH Human (HUMULIN N,NOVOLIN N) 100 UNIT/ML injection Inject 5-20 Units into the skin 2 (two) times daily before a meal. Take 20 units in the morning 5-6 units taken in the evening   Yes Historical Provider, MD  levothyroxine (SYNTHROID, LEVOTHROID) 137 MCG tablet  Take 137 mcg by mouth daily before breakfast.   Yes Historical Provider, MD  lisinopril (PRINIVIL,ZESTRIL) 20 MG tablet Take 20 mg by mouth daily.   Yes Historical Provider, MD  Multiple Vitamins-Minerals (MULTI-BETIC DIABETES) TABS Take 1 tablet by mouth daily.   Yes Historical Provider, MD  nicotine polacrilex (NICORETTE) 4 MG gum Take 4 mg by mouth as needed for smoking cessation.   Yes Historical Provider, MD  ALBUTEROL IN Inhale into the lungs.    Historical Provider, MD  Ascorbic Acid (VITAMIN C PO) Take by mouth.    Historical Provider, MD  cephALEXin (KEFLEX) 500 MG capsule Take 1 capsule (500 mg total) by mouth 3 (three) times daily. Patient not taking: Reported on 02/05/2015 08/18/14   Audelia Hives Presson, PA  CLONIDINE HCL PO Take by mouth.    Historical Provider, MD  LEVOTHYROXINE SODIUM PO Take by mouth.    Historical Provider, MD  LISINOPRIL PO Take by mouth.    Historical Provider, MD  Multiple Vitamin (MULTIVITAMIN) capsule Take 1 capsule by mouth daily.    Historical Provider, MD    Allergies: No Known Allergies  Past Medical History: Past Medical History  Diagnosis Date  . Hypertension   . Diabetes mellitus without complication   . Asthma     History reviewed. No pertinent past surgical history.  Social History: She lives by herself. Uses a cane to walk outside, but otherwise independent with daily activities. She smokes 1-1/2-2 packs of cigarettes on a daily basis. Drinks  2 glasses of wine on a nightly basis. Denies any beer intake or liquor intake. Denies any illicit drug use.  Family History:  Family History  Problem Relation Age of Onset  . Diabetes Brother      Review of Systems - History obtained from the patient General ROS: negative Psychological ROS: negative Ophthalmic ROS: negative ENT ROS: negative Allergy and Immunology ROS: negative Hematological and Lymphatic ROS: negative Endocrine ROS: negative Respiratory ROS: occ SOB with  ambulation Cardiovascular ROS: as above Gastrointestinal ROS: no abdominal pain, change in bowel habits, or black or bloody stools Genito-Urinary ROS: no dysuria, trouble voiding, or hematuria Musculoskeletal ROS: negative Neurological ROS: no TIA or stroke symptoms Dermatological ROS: negative  Physical Examination  Filed Vitals:   02/05/15 0900 02/05/15 0930 02/05/15 1000 02/05/15 1137  BP: 170/74 161/75 152/77   Pulse: 96 92 93   Temp:      TempSrc:      Resp: _0 SpO2: 97% 97% 100% 100%    BP 152/77 mmHg  Pulse 93  Temp(Src) 98.4 F (36.9 C) (Oral)  Resp 20  SpO2 100%  General appearance: alert, cooperative, appears stated age and no distress Head: Normocephalic, without obvious abnormality, atraumatic Eyes: conjunctivae/corneas clear. PERRL, EOM's intact.  Throat: lips, mucosa, and tongue normal; teeth and gums normal Neck: no adenopathy, no carotid bruit, no JVD, supple, symmetrical, trachea midline and thyroid not enlarged, symmetric, no tenderness/mass/nodules Resp: Coarse breath sounds bilaterally without any wheezing or crackles. Cardio: regular rate and rhythm, S1, S2 normal, no murmur, click, rub or gallop GI: soft, non-tender; bowel sounds normal; no masses,  no organomegaly Extremities: Right lower extremity is externally rotated Pulses: 2+ and symmetric Skin: Skin color, texture, turgor normal. No rashes or lesions Lymph nodes: Cervical, supraclavicular, and axillary nodes normal. Neurologic: No focal deficits  Laboratory Data: Results for orders placed or performed during the hospital encounter of 02/05/15 (from the past 48 hour(s))  Urinalysis, Routine w reflex microscopic (not at [facility])     Status: Abnormal   Collection Time: 02/05/15  5:28 AM  Result Value Ref Range   Color, Urine YELLOW YELLOW   APPearance CLOUDY (A) CLEAR   Specific Gravity, Urine 1.014 1.005 - 1.030   pH 7.5 5.0 - 8.0   Glucose, UA 500 (A) NEGATIVE mg/dL   Hgb urine  dipstick SMALL (A) NEGATIVE   Bilirubin Urine NEGATIVE NEGATIVE   Ketones, ur 40 (A) NEGATIVE mg/dL   Protein, ur NEGATIVE NEGATIVE mg/dL   Urobilinogen, UA 0.2 0.0 - 1.0 mg/dL   Nitrite NEGATIVE NEGATIVE   Leukocytes, UA NEGATIVE NEGATIVE  Urine microscopic-add on     Status: None   Collection Time: 02/05/15  5:28 AM  Result Value Ref Range   Squamous Epithelial / LPF RARE RARE   WBC, UA 0-2 <3 WBC/hpf   RBC / HPF 3-6 <3 RBC/hpf   Bacteria, UA RARE RARE  ABO/Rh     Status: None   Collection Time: 02/05/15  5:30 AM  Result Value Ref Range   ABO/RH(D) B POS   CBC WITH DIFFERENTIAL     Status: Abnormal   Collection Time: 02/05/15  5:38 AM  Result Value Ref Range   WBC 13.8 (H) 4.0 - 10.5 K/uL   RBC 3.95 3.87 - 5.11 MIL/uL   Hemoglobin 13.1 12.0 - 15.0 g/dL   HCT 38.6 36.0 - 46.0 %   MCV 97.7 78.0 - 100.0 fL   MCH 33.2 26.0 - 34.0  pg   MCHC 33.9 30.0 - 36.0 g/dL   RDW 12.5 11.5 - 15.5 %   Platelets 202 150 - 400 K/uL   Neutrophils Relative % 88 (H) 43 - 77 %   Neutro Abs 12.1 (H) 1.7 - 7.7 K/uL   Lymphocytes Relative 7 (L) 12 - 46 %   Lymphs Abs 0.9 0.7 - 4.0 K/uL   Monocytes Relative 5 3 - 12 %   Monocytes Absolute 0.7 0.1 - 1.0 K/uL   Eosinophils Relative 0 0 - 5 %   Eosinophils Absolute 0.0 0.0 - 0.7 K/uL   Basophils Relative 0 0 - 1 %   Basophils Absolute 0.0 0.0 - 0.1 K/uL  Protime-INR     Status: None   Collection Time: 02/05/15  5:38 AM  Result Value Ref Range   Prothrombin Time 13.5 11.6 - 15.2 seconds   INR 1.01 0.00 - 1.49  Type and screen     Status: None   Collection Time: 02/05/15  5:38 AM  Result Value Ref Range   ABO/RH(D) B POS    Antibody Screen NEG    Sample Expiration 63/89/3734   Basic metabolic panel     Status: Abnormal   Collection Time: 02/05/15  5:38 AM  Result Value Ref Range   Sodium 134 (L) 135 - 145 mmol/L   Potassium 3.5 3.5 - 5.1 mmol/L   Chloride 99 (L) 101 - 111 mmol/L   CO2 24 22 - 32 mmol/L   Glucose, Bld 284 (H) 65 - 99 mg/dL    BUN 11 6 - 20 mg/dL   Creatinine, Ser 0.59 0.44 - 1.00 mg/dL   Calcium 8.7 (L) 8.9 - 10.3 mg/dL   GFR calc non Af Amer >60 >60 mL/min   GFR calc Af Amer >60 >60 mL/min    Comment: (NOTE) The eGFR has been calculated using the CKD EPI equation. This calculation has not been validated in all clinical situations. eGFR's persistently <60 mL/min signify possible Chronic Kidney Disease.    Anion gap 11 5 - 15    Radiology Reports: Ct Head Wo Contrast  02/05/2015   CLINICAL DATA:  Fall on blood thinners. Head injury. Initial encounter.  EXAM: CT HEAD WITHOUT CONTRAST  TECHNIQUE: Contiguous axial images were obtained from the base of the skull through the vertex without intravenous contrast.  COMPARISON:  None.  FINDINGS: Skull and Sinuses:Lucency in the right low occipital bone is most consistent with a vascular channel. No acute fracture suspected.  Patchy mucosal thickening in the bilateral paranasal sinuses, greatest in the ethmoids. No sinus effusion.  Orbits: No traumatic finding.  Bilateral cataract resection.  Brain: No evidence of intracranial injury. No evidence of acute infarction, hemorrhage, hydrocephalus, or mass lesion/mass effect. Chronic small-vessel disease with ischemic gliosis throughout the periventricular white matter. Remote lacunar infarct present in the left centrum semiovale. Cortical atrophy, mild for age.  IMPRESSION: 1. No intracranial injury or fracture. 2. Cortical atrophy and moderate chronic small vessel disease. 3. Chronic sinusitis.   Electronically Signed   By: Monte Fantasia M.D.   On: 02/05/2015 06:47   Dg Chest Port 1 View  02/05/2015   CLINICAL DATA:  Pre-operative evaluation for hip fracture.  EXAM: PORTABLE CHEST - 1 VIEW  COMPARISON:  08/07/2004  FINDINGS: Prominent central vascular markings with some peribronchial thickening. No focal airspace disease. Heart size is within normal limits. Slightly low lung volumes compared to the previous examination. The  trachea is midline. Negative for a pneumothorax.  Bony thorax is intact. There appears to be a 5 mm nodule at the left lung base which is likely stable from 2006.  IMPRESSION: Prominent central vascular structures with mild peribronchial thickening. Findings are suggestive for vascular congestion or mild edema.   Electronically Signed   By: Markus Daft M.D.   On: 02/05/2015 09:02   Dg Knee Complete 4 Views Right  02/05/2015   CLINICAL DATA:  [place] while picking up a pillow, landed on RIGHT side. RIGHT knee pain and swelling.  EXAM: RIGHT KNEE - COMPLETE 4+ VIEW  COMPARISON:  None.  FINDINGS: Limited examination due to patient positioning. No acute fracture deformity. No dislocation. Mild suspected medial compartment osteoarthrosis. Moderate vascular calcifications. Soft tissue planes are nonsuspicious.  IMPRESSION: No acute fracture deformity or dislocation.  Osteopenia, decreasing sensitivity for acute nondisplaced fractures.   Electronically Signed   By: Elon Alas M.D.   On: 02/05/2015 05:02   Dg Hip Unilat  With Pelvis 2-3 Views Right  02/05/2015   CLINICAL DATA:  [place] while picking up a pillow, landed on RIGHT side. RIGHT knee pain and swelling.  EXAM: DG HIP (WITH OR WITHOUT PELVIS) 2-3V RIGHT  COMPARISON:  None.  FINDINGS: Oblique nondisplaced RIGHT femur intertrochanteric fracture extending through the greater and lesser trochanter. Femoral heads are well formed and located. No dislocation. No destructive bony lesions. Small RIGHT hip effusion. Moderate aortoiliac vascular calcifications.  IMPRESSION: Nondisplaced acute RIGHT femur intertrochanteric fracture. No dislocation.   Electronically Signed   By: Elon Alas M.D.   On: 02/05/2015 05:05    My interpretation of Electrocardiogram: Sinus rhythm at 97 bpm. normal axis. Normal intervals. No Q waves. ST depression seen in inferior leads. Also seen in V3 to V6. No older EKGs available for comparison.  Problem List  Principal Problem:    Hip fracture Active Problems:   Closed right hip fracture   Abnormal EKG   Tobacco abuse   DM2 (diabetes mellitus, type 2)   Essential hypertension   Hypothyroidism   Assessment: This is a 79 year old Caucasian female who presented after a mechanical fall and sustained a right hip fracture. She has diabetes, hypertension, hypothyroidism. She is a heavy smoker. Also drinks wine on a daily basis. EKG is remarkably abnormal. We don't have an older one for comparison. She denies any chest pain with ambulation but does admit to shortness of breath. According to the daughter, patient had an episode of left arm pain with diaphoresis a few weeks ago.  Plan: #1 Preoperative evaluation: EKG is remarkably abnormal. She has multiple risk factors for coronary artery disease in the form of diabetes, hypertension and tobacco abuse. There is no family history of same. Her functional capacity is low to moderate. She will be undergoing an intermediate risk procedure for hip fracture. At this time she needs further cardiac workup. Cardiology has been consulted. She also is a heavy smoker and likely has COPD. She is at risk for prolonged intubation. She will need pulmonary toilet postoperatively including incentive spirometry. Vascular congestion noted on chest x-ray. No clinical concern for pulmonary edema. Echocardiogram to be considered. Will defer to cardiology.  #2 Tobacco abuse: Nicotine patch will be prescribed. She will need counseling  #3 Alcohol use: She drinks 2 glasses of wine every night. There are times when she doesn't drink. History was somewhat vague. She absolutely denies any beer or liquor intake. We will monitor her closely for signs and symptoms of withdrawal.  #4 diabetes mellitus type 2 on  insulin: Sliding scale coverage will be initiated. Check HbA1c. Hold her insulin Nfor now.  #5 history of essential hypertension: She is noted to be on clonidine. We will continue it at a lower dose.  Holding parameters. Monitor blood pressures closely.  #6 Right hip fracture: Orthopedics has been consulted. Dr. Erlinda Hong has been made aware of her preoperative evaluation and plan for cardiac workup. He will continue to follow. Surgery tentatively scheduled for tomorrow.  ADDENDUM Cardiology has seen the patient. They are also concerned by EKG findings. Plan is for cardiac catheterization today. This has been communicated to Dr. Erlinda Hong with orthopedics.   DVT Prophylaxis: SCDs for now. Definitive prophylaxis postprocedure. Code Status: PATIENT HAD INITIALLY RESCINDED HER DNR FOR THIS HOSPITALIZATION. **PLEASE SEE MY OTHER NOTE AS WELL**. NOW SHE WANTS TO BE DNR BUT OK WITH INTUBATION FOR SURGERY. Family Communication: Discussed with the patient and her daughters  Disposition Plan: Transferred to [facility].   Further management decisions will depend on results of further testing and patient's response to treatment.   [facility]  [facility] Pager [phone]  If 7PM-7AM, please contact night-coverage [email or web address] Password [facility]  02/05/2015, 11:44 AM

## 2015-02-05 NOTE — Consult Note (Signed)
Patient ID: NASHAY BRICKLEY MRN: [id number], DOB/AGE: June 01, 1936   Admit date: 02/05/2015   Primary Physician: No primary care provider on file. Primary Cardiologist: New  Pt. Profile:  79 year old female with no known history of coronary disease but with multiple risk factors including hypertension, diabetes and tobacco abuse, admitted for hip fracture in the setting of mechanical fall, being evaluated for surgical clearance.   Problem List  Past Medical History  Diagnosis Date  . Hypertension   . Diabetes mellitus without complication   . Asthma     History reviewed. No pertinent past surgical history.   Allergies  No Known Allergies  HPI  This is a 78 year old female with no known history of coronary disease but with multiple risk factors including hypertension, insulin dependent diabetes and a 60 + yr h/o tobacco abuse, admitted for right hip fracture in the setting of mechanical fall, now being evaluated for surgical clearance.  Her EKG demonstrates diffuse ST depressions in both the inferior and lateral leads. There are no prior EKGs to compare.   She herself denies any history of chest pain, however her daughters report that she has complained of at least 2 episodes of severe left sided chest pain radiating to her back in the last year. Both episodes were also associated with diaphoresis. Her family urged her to seek medical attention both times, however she refused. She also has limited exercise capacity due to dyspnea on exertion. Her family notes that she is not followed regularly by a PCP. Dr. Romero Belling manages her insulin. She continues to smoke ~2ppd.      Home Medications  Prior to Admission medications   Medication Sig Start Date End Date Taking? Authorizing Provider  albuterol (PROVENTIL HFA;VENTOLIN HFA) 108 (90 BASE) MCG/ACT inhaler Inhale 1 puff into the lungs every 6 (six) hours as needed for wheezing or shortness of breath.   Yes Historical Provider, MD    aspirin 325 MG tablet Take 325 mg by mouth every 6 (six) hours as needed for mild pain.   Yes Historical Provider, MD  Cholecalciferol (VITAMIN D PO) Take 2,000 Units by mouth daily.    Yes Historical Provider, MD  cloNIDine (CATAPRES) 0.2 MG tablet Take 0.2 mg by mouth 2 (two) times daily.   Yes Historical Provider, MD  insulin NPH Human (HUMULIN N,NOVOLIN N) 100 UNIT/ML injection Inject 5-20 Units into the skin 2 (two) times daily before a meal. Take 20 units in the morning 5-6 units taken in the evening   Yes Historical Provider, MD  levothyroxine (SYNTHROID, LEVOTHROID) 137 MCG tablet Take 137 mcg by mouth daily before breakfast.   Yes Historical Provider, MD  lisinopril (PRINIVIL,ZESTRIL) 20 MG tablet Take 20 mg by mouth daily.   Yes Historical Provider, MD  Multiple Vitamins-Minerals (MULTI-BETIC DIABETES) TABS Take 1 tablet by mouth daily.   Yes Historical Provider, MD  nicotine polacrilex (NICORETTE) 4 MG gum Take 4 mg by mouth as needed for smoking cessation.   Yes Historical Provider, MD  ALBUTEROL IN Inhale into the lungs.    Historical Provider, MD  Ascorbic Acid (VITAMIN C PO) Take by mouth.    Historical Provider, MD  cephALEXin (KEFLEX) 500 MG capsule Take 1 capsule (500 mg total) by mouth 3 (three) times daily. Patient not taking: Reported on 02/05/2015 08/18/14   Mathis Fare Presson, PA  CLONIDINE HCL PO Take by mouth.    Historical Provider, MD  LEVOTHYROXINE SODIUM PO Take by mouth.    Historical  Provider, MD  LISINOPRIL PO Take by mouth.    Historical Provider, MD  Multiple Vitamin (MULTIVITAMIN) capsule Take 1 capsule by mouth daily.    Historical Provider, MD    Family History  Family History  Problem Relation Age of Onset  . Diabetes Brother     Social History  Social History   Social History  . Marital Status: Widowed    Spouse Name: N/A  . Number of Children: N/A  . Years of Education: N/A   Occupational History  . Not on file.   Social History Main  Topics  . Smoking status: Current Every Day Smoker -- 2.00 packs/day for 60 years  . Smokeless tobacco: Not on file  . Alcohol Use: Yes  . Drug Use: No  . Sexual Activity: Not on file   Other Topics Concern  . Not on file   Social History Narrative     Review of Systems General:  No chills, fever, night sweats or weight changes.  Cardiovascular:  No chest pain, dyspnea on exertion, edema, orthopnea, palpitations, paroxysmal nocturnal dyspnea. Dermatological: No rash, lesions/masses Respiratory: No cough, dyspnea Urologic: No hematuria, dysuria Abdominal:   No nausea, vomiting, diarrhea, bright red blood per rectum, melena, or hematemesis Neurologic:  No visual changes, wkns, changes in mental status. All other systems reviewed and are otherwise negative except as noted above.  Physical Exam  Blood pressure 177/84, pulse 92, temperature 98.4 F (36.9 C), temperature source Oral, resp. rate 20, SpO2 96 %.  General: Pleasant, NAD Psych: Normal affect. Neuro: Alert and oriented X 3. Limited mobility of right hip. HEENT: Normal  Neck: Supple without bruits or JVD. Lungs:  Resp regular and unlabored, CTA. Heart: RRR no s3, s4, or murmurs. Abdomen: Soft, non-tender, non-distended, BS + x 4.  Extremities: No clubbing, cyanosis or edema. DP/PT/Radials 2+ and equal bilaterally.  Labs  Troponin (Point of Care Test) No results for input(s): TROPIPOC in the last 72 hours. No results for input(s): CKTOTAL, CKMB, TROPONINI in the last 72 hours. Lab Results  Component Value Date   WBC 13.8* 02/05/2015   HGB 13.1 02/05/2015   HCT 38.6 02/05/2015   MCV 97.7 02/05/2015   PLT 202 02/05/2015     Recent Labs Lab 02/05/15 0538  NA 134*  K 3.5  CL 99*  CO2 24  BUN 11  CREATININE 0.59  CALCIUM 8.7*  GLUCOSE 284*   No results found for: CHOL, HDL, LDLCALC, TRIG No results found for: DDIMER   Radiology/Studies  Ct Head Wo Contrast  02/05/2015   CLINICAL DATA:  Fall on blood  thinners. Head injury. Initial encounter.  EXAM: CT HEAD WITHOUT CONTRAST  TECHNIQUE: Contiguous axial images were obtained from the base of the skull through the vertex without intravenous contrast.  COMPARISON:  None.  FINDINGS: Skull and Sinuses:Lucency in the right low occipital bone is most consistent with a vascular channel. No acute fracture suspected.  Patchy mucosal thickening in the bilateral paranasal sinuses, greatest in the ethmoids. No sinus effusion.  Orbits: No traumatic finding.  Bilateral cataract resection.  Brain: No evidence of intracranial injury. No evidence of acute infarction, hemorrhage, hydrocephalus, or mass lesion/mass effect. Chronic small-vessel disease with ischemic gliosis throughout the periventricular white matter. Remote lacunar infarct present in the left centrum semiovale. Cortical atrophy, mild for age.  IMPRESSION: 1. No intracranial injury or fracture. 2. Cortical atrophy and moderate chronic small vessel disease. 3. Chronic sinusitis.   Electronically Signed   By: Marja Kays  Watts M.D.   On: 02/05/2015 06:47   Dg Chest Port 1 View  02/05/2015   CLINICAL DATA:  Pre-operative evaluation for hip fracture.  EXAM: PORTABLE CHEST - 1 VIEW  COMPARISON:  08/07/2004  FINDINGS: Prominent central vascular markings with some peribronchial thickening. No focal airspace disease. Heart size is within normal limits. Slightly low lung volumes compared to the previous examination. The trachea is midline. Negative for a pneumothorax. Bony thorax is intact. There appears to be a 5 mm nodule at the left lung base which is likely stable from 2006.  IMPRESSION: Prominent central vascular structures with mild peribronchial thickening. Findings are suggestive for vascular congestion or mild edema.   Electronically Signed   By: Richarda Overlie M.D.   On: 02/05/2015 09:02   Dg Knee Complete 4 Views Right  02/05/2015   CLINICAL DATA:  Larey Seat while picking up a pillow, landed on RIGHT side. RIGHT knee  pain and swelling.  EXAM: RIGHT KNEE - COMPLETE 4+ VIEW  COMPARISON:  None.  FINDINGS: Limited examination due to patient positioning. No acute fracture deformity. No dislocation. Mild suspected medial compartment osteoarthrosis. Moderate vascular calcifications. Soft tissue planes are nonsuspicious.  IMPRESSION: No acute fracture deformity or dislocation.  Osteopenia, decreasing sensitivity for acute nondisplaced fractures.   Electronically Signed   By: Awilda Metro M.D.   On: 02/05/2015 05:02   Dg Hip Unilat  With Pelvis 2-3 Views Right  02/05/2015   CLINICAL DATA:  Larey Seat while picking up a pillow, landed on RIGHT side. RIGHT knee pain and swelling.  EXAM: DG HIP (WITH OR WITHOUT PELVIS) 2-3V RIGHT  COMPARISON:  None.  FINDINGS: Oblique nondisplaced RIGHT femur intertrochanteric fracture extending through the greater and lesser trochanter. Femoral heads are well formed and located. No dislocation. No destructive bony lesions. Small RIGHT hip effusion. Moderate aortoiliac vascular calcifications.  IMPRESSION: Nondisplaced acute RIGHT femur intertrochanteric fracture. No dislocation.   Electronically Signed   By: Awilda Metro M.D.   On: 02/05/2015 05:05    ECG  Diffuse ST depressions in the inferior and lateral leads    ASSESSMENT AND PLAN  Principal Problem:   Hip fracture Active Problems:   Closed right hip fracture   Abnormal EKG   Tobacco abuse   DM2 (diabetes mellitus, type 2)   Essential hypertension   Hypothyroidism   1. Abnormal EKG: Patient's EKG demonstrates inferior and lateral ST depressions. No prior EKGs to compare to. Given her multiple risk factors including IDDM, 60+ year h/o ongoing tobacco abuse, HTN, abnormal EKG and previous symptoms of chest/ back pain and DOE, she likely has underlying CAD. She denies any current CP. Agree with transfer to [facility]. Will continue w/u. Will order 2D echo to assess LV systolic function, wall motion and valve anatomy. Will determine  additional ischemic w/u based on echo findings. Stress test vs LHC. Given concerns for underlying CAD, would add a statin and BB to her medical regimen. Screen for HLD with FLP in the am.   2. Right Hip Fx: subsequent to mechanical fall. Unfortunately, we cannot clear for surgery at this time. Given her multiple risk factors including IDDM, 60+ year h/o ongoing tobacco abuse, HTN, abnormal EKG and previous symptoms of chest/ back pain and DOE, she likely has underlying CAD. Will need further cardiac w/u before surgical clearance is granted.   3. IDDM: management per Internal Medicine.  4. HTN: currently elevated in the 170s systolic. Continue home lisinopril. Add BB.    5. Tobacco Abuse:  smoking cessation strongly advised.     Signed, Robbie Lis, PA-C 02/05/2015, 9:07 AM  Patient examined chart reviewed.  Long discussion with patient and daughters.  Exam remarkable for chronically ill white female.  Right carotid bruit.  She is a long standing IDDM, and smoker.  ECG with relative tachycardia worrisome for ischemia.  Non displaced right femoral neck fracture Favor diagnostic cath to day to r/o CAD along with echo and carotid duplex.  Start beta blocker and nitrates. Risks of cath including stroke discussed with patient and family willing to proceed.  Charlton Haws

## 2015-02-05 NOTE — Progress Notes (Signed)
Orthopedic Tech Progress Note Patient Details:  Tara Flores December 20, 1935 [id number]  Patient ID: Tara Flores, female   DOB: 09/27/1935, 79 y.o.   MRN: [id number] Pt unable to use trapeze bar patient helper; RN notified  Tara Flores 02/05/2015, 3:08 PM

## 2015-02-05 NOTE — Interval H&P Note (Signed)
History and Physical Interval Note:  02/05/2015 11:28 AM  [name]  has presented today for cardiac cath with the diagnosis of unstable angina.  The various methods of treatment have been discussed with the patient and family. After consideration of risks, benefits and other options for treatment, the patient has consented to  Procedure(s): Left Heart Cath and Coronary Angiography (N/A) as a surgical intervention .  The patient's history has been reviewed, patient examined, no change in status, stable for surgery.  I have reviewed the patient's chart and labs.  Questions were answered to the patient's satisfaction.    Cath Lab Visit (complete for each Cath Lab visit)  Clinical Evaluation Leading to the Procedure:   ACS: No.  Non-ACS:    Anginal Classification: CCS III  Anti-ischemic medical therapy: No Therapy  Non-Invasive Test Results: No non-invasive testing performed  Prior CABG: No previous CABG        MCALHANY,CHRISTOPHER

## 2015-02-05 NOTE — Consult Note (Signed)
ORTHOPAEDIC CONSULTATION  REQUESTING PHYSICIAN: Osvaldo Shipper, MD  Chief Complaint: Right hip fx  HPI: Tara Flores is a 79 y.o. female who presents with Right hip fx s/p mechanical fall.  Walked with walker outdoors at baseline.  Lived independently.  Denies prior hip pain.  Denies syncope prior to fall.  Left hip pain is severe and does not radiate, worse with movement of left hip.  Reported CP, SOB and with EKG findings were taken to cath lab acutely.  Full cardiac workup pending.  Ortho consulted for left hip fx.  Past Medical History  Diagnosis Date  . Hypertension   . Diabetes mellitus without complication   . Asthma    Past Surgical History  Procedure Laterality Date  . Cardiac catheterization N/A 02/05/2015    Procedure: Left Heart Cath and Coronary Angiography;  Surgeon: Kathleene Hazel, MD;  Location: [facility] INVASIVE CV LAB;  Service: Cardiovascular;  Laterality: N/A;   Social History   Social History  . Marital Status: Widowed    Spouse Name: N/A  . Number of Children: N/A  . Years of Education: N/A   Social History Main Topics  . Smoking status: Current Every Day Smoker -- 2.00 packs/day for 60 years  . Smokeless tobacco: None  . Alcohol Use: Yes  . Drug Use: No  . Sexual Activity: Not Asked   Other Topics Concern  . None   Social History Narrative   Family History  Problem Relation Age of Onset  . Diabetes Brother    No Known Allergies Prior to Admission medications   Medication Sig Start Date End Date Taking? Authorizing Provider  albuterol (PROVENTIL HFA;VENTOLIN HFA) 108 (90 BASE) MCG/ACT inhaler Inhale 1 puff into the lungs every 6 (six) hours as needed for wheezing or shortness of breath.   Yes Historical Provider, MD  aspirin 325 MG tablet Take 325 mg by mouth every 6 (six) hours as needed for mild pain.   Yes Historical Provider, MD  Cholecalciferol (VITAMIN D PO) Take 2,000 Units by mouth daily.    Yes Historical Provider, MD  cloNIDine  (CATAPRES) 0.2 MG tablet Take 0.2 mg by mouth 2 (two) times daily.   Yes Historical Provider, MD  insulin NPH Human (HUMULIN N,NOVOLIN N) 100 UNIT/ML injection Inject 5-20 Units into the skin 2 (two) times daily before a meal. Take 20 units in the morning 5-6 units taken in the evening   Yes Historical Provider, MD  levothyroxine (SYNTHROID, LEVOTHROID) 137 MCG tablet Take 137 mcg by mouth daily before breakfast.   Yes Historical Provider, MD  lisinopril (PRINIVIL,ZESTRIL) 20 MG tablet Take 20 mg by mouth daily.   Yes Historical Provider, MD  Multiple Vitamins-Minerals (MULTI-BETIC DIABETES) TABS Take 1 tablet by mouth daily.   Yes Historical Provider, MD  nicotine polacrilex (NICORETTE) 4 MG gum Take 4 mg by mouth as needed for smoking cessation.   Yes Historical Provider, MD  ALBUTEROL IN Inhale into the lungs.    Historical Provider, MD  Ascorbic Acid (VITAMIN C PO) Take by mouth.    Historical Provider, MD  cephALEXin (KEFLEX) 500 MG capsule Take 1 capsule (500 mg total) by mouth 3 (three) times daily. Patient not taking: Reported on 02/05/2015 08/18/14   Mathis Fare Presson, PA  CLONIDINE HCL PO Take by mouth.    Historical Provider, MD  LEVOTHYROXINE SODIUM PO Take by mouth.    Historical Provider, MD  LISINOPRIL PO Take by mouth.    Historical Provider, MD  Multiple Vitamin (MULTIVITAMIN) capsule Take 1 capsule by mouth daily.    Historical Provider, MD   Ct Head Wo Contrast  02/05/2015   CLINICAL DATA:  Fall on blood thinners. Head injury. Initial encounter.  EXAM: CT HEAD WITHOUT CONTRAST  TECHNIQUE: Contiguous axial images were obtained from the base of the skull through the vertex without intravenous contrast.  COMPARISON:  None.  FINDINGS: Skull and Sinuses:Lucency in the right low occipital bone is most consistent with a vascular channel. No acute fracture suspected.  Patchy mucosal thickening in the bilateral paranasal sinuses, greatest in the ethmoids. No sinus effusion.  Orbits: No  traumatic finding.  Bilateral cataract resection.  Brain: No evidence of intracranial injury. No evidence of acute infarction, hemorrhage, hydrocephalus, or mass lesion/mass effect. Chronic small-vessel disease with ischemic gliosis throughout the periventricular white matter. Remote lacunar infarct present in the left centrum semiovale. Cortical atrophy, mild for age.  IMPRESSION: 1. No intracranial injury or fracture. 2. Cortical atrophy and moderate chronic small vessel disease. 3. Chronic sinusitis.   Electronically Signed   By: Marnee Spring M.D.   On: 02/05/2015 06:47   Dg Chest Port 1 View  02/05/2015   CLINICAL DATA:  Pre-operative evaluation for hip fracture.  EXAM: PORTABLE CHEST - 1 VIEW  COMPARISON:  08/07/2004  FINDINGS: Prominent central vascular markings with some peribronchial thickening. No focal airspace disease. Heart size is within normal limits. Slightly low lung volumes compared to the previous examination. The trachea is midline. Negative for a pneumothorax. Bony thorax is intact. There appears to be a 5 mm nodule at the left lung base which is likely stable from 2006.  IMPRESSION: Prominent central vascular structures with mild peribronchial thickening. Findings are suggestive for vascular congestion or mild edema.   Electronically Signed   By: Richarda Overlie M.D.   On: 02/05/2015 09:02   Dg Knee Complete 4 Views Right  02/05/2015   CLINICAL DATA:  Larey Seat while picking up a pillow, landed on RIGHT side. RIGHT knee pain and swelling.  EXAM: RIGHT KNEE - COMPLETE 4+ VIEW  COMPARISON:  None.  FINDINGS: Limited examination due to patient positioning. No acute fracture deformity. No dislocation. Mild suspected medial compartment osteoarthrosis. Moderate vascular calcifications. Soft tissue planes are nonsuspicious.  IMPRESSION: No acute fracture deformity or dislocation.  Osteopenia, decreasing sensitivity for acute nondisplaced fractures.   Electronically Signed   By: Awilda Metro M.D.    On: 02/05/2015 05:02   Dg Hip Unilat  With Pelvis 2-3 Views Right  02/05/2015   CLINICAL DATA:  Larey Seat while picking up a pillow, landed on RIGHT side. RIGHT knee pain and swelling.  EXAM: DG HIP (WITH OR WITHOUT PELVIS) 2-3V RIGHT  COMPARISON:  None.  FINDINGS: Oblique nondisplaced RIGHT femur intertrochanteric fracture extending through the greater and lesser trochanter. Femoral heads are well formed and located. No dislocation. No destructive bony lesions. Small RIGHT hip effusion. Moderate aortoiliac vascular calcifications.  IMPRESSION: Nondisplaced acute RIGHT femur intertrochanteric fracture. No dislocation.   Electronically Signed   By: Awilda Metro M.D.   On: 02/05/2015 05:05    Positive ROS: All other systems have been reviewed and were otherwise negative with the exception of those mentioned in the HPI and as above.  Physical Exam: General: Alert, no acute distress Cardiovascular: No pedal edema Respiratory: No cyanosis, no use of accessory musculature GI: No organomegaly, abdomen is soft and non-tender Skin: No lesions in the area of chief complaint Neurologic: Sensation intact distally Psychiatric: Patient is  competent for consent with normal mood and affect Lymphatic: No axillary or cervical lymphadenopathy  MUSCULOSKELETAL:  - severe pain with movement of right hip - RLE NVI - compartment soft - skin intact  Assessment: Right IT hip fx Cardiac disease DM  Plan: - cardiac workup and optimization pending - appreciate recs as to when surgery can be performed - surgery is tentatively scheduled for wed pm - NPO after midnight - consent obtained - patient and family understand r/b/a to surgery and wish to proceed  Thank you for the consult and the opportunity to see Ms. April Manson, MD [facility] [phone] 5:23 PM

## 2015-02-05 NOTE — Progress Notes (Signed)
TR BAND REMOVAL  LOCATION:  right radial  DEFLATED PER PROTOCOL:  Yes.    TIME BAND OFF / DRESSING APPLIED:   1745   SITE UPON ARRIVAL:   Level 0  SITE AFTER BAND REMOVAL:  Level 0  REVERSE ALLEN'S TEST:    positive  CIRCULATION SENSATION AND MOVEMENT:  Within Normal Limits  Yes.    COMMENTS:    

## 2015-02-05 NOTE — ED Notes (Signed)
Lab delay - Dr in with patient.  RN's doing multiple

## 2015-02-05 NOTE — ED Notes (Signed)
Patient transported to CT 

## 2015-02-05 NOTE — Progress Notes (Signed)
  Echocardiogram 2D Echocardiogram has been performed.  Tara Flores M 02/05/2015, 3:21 PM

## 2015-02-05 NOTE — Progress Notes (Signed)
*  PRELIMINARY RESULTS* Vascular Ultrasound Carotid Duplex (Doppler) has been completed.  Findings suggest 1-39% internal carotid artery stenosis bilaterally. Vertebral arteries are patent with antegrade flow.  02/05/2015 3:50 PM Gertie Fey, RVT, RDCS, RDMS

## 2015-02-05 NOTE — ED Notes (Signed)
Patient right foot marked at pulse.

## 2015-02-05 NOTE — H&P (View-Only) (Signed)
Patient ID: Tara Flores MRN: [id number], DOB/AGE: June 01, 1936   Admit date: 02/05/2015   Primary Physician: No primary care provider on file. Primary Cardiologist: New  Pt. Profile:  79 year old female with no known history of coronary disease but with multiple risk factors including hypertension, diabetes and tobacco abuse, admitted for hip fracture in the setting of mechanical fall, being evaluated for surgical clearance.   Problem List  Past Medical History  Diagnosis Date  . Hypertension   . Diabetes mellitus without complication   . Asthma     History reviewed. No pertinent past surgical history.   Allergies  No Known Allergies  HPI  This is a 78 year old female with no known history of coronary disease but with multiple risk factors including hypertension, insulin dependent diabetes and a 60 + yr h/o tobacco abuse, admitted for right hip fracture in the setting of mechanical fall, now being evaluated for surgical clearance.  Her EKG demonstrates diffuse ST depressions in both the inferior and lateral leads. There are no prior EKGs to compare.   She herself denies any history of chest pain, however her daughters report that she has complained of at least 2 episodes of severe left sided chest pain radiating to her back in the last year. Both episodes were also associated with diaphoresis. Her family urged her to seek medical attention both times, however she refused. She also has limited exercise capacity due to dyspnea on exertion. Her family notes that she is not followed regularly by a PCP. Dr. Romero Belling manages her insulin. She continues to smoke ~2ppd.      Home Medications  Prior to Admission medications   Medication Sig Start Date End Date Taking? Authorizing Provider  albuterol (PROVENTIL HFA;VENTOLIN HFA) 108 (90 BASE) MCG/ACT inhaler Inhale 1 puff into the lungs every 6 (six) hours as needed for wheezing or shortness of breath.   Yes Historical Provider, MD    aspirin 325 MG tablet Take 325 mg by mouth every 6 (six) hours as needed for mild pain.   Yes Historical Provider, MD  Cholecalciferol (VITAMIN D PO) Take 2,000 Units by mouth daily.    Yes Historical Provider, MD  cloNIDine (CATAPRES) 0.2 MG tablet Take 0.2 mg by mouth 2 (two) times daily.   Yes Historical Provider, MD  insulin NPH Human (HUMULIN N,NOVOLIN N) 100 UNIT/ML injection Inject 5-20 Units into the skin 2 (two) times daily before a meal. Take 20 units in the morning 5-6 units taken in the evening   Yes Historical Provider, MD  levothyroxine (SYNTHROID, LEVOTHROID) 137 MCG tablet Take 137 mcg by mouth daily before breakfast.   Yes Historical Provider, MD  lisinopril (PRINIVIL,ZESTRIL) 20 MG tablet Take 20 mg by mouth daily.   Yes Historical Provider, MD  Multiple Vitamins-Minerals (MULTI-BETIC DIABETES) TABS Take 1 tablet by mouth daily.   Yes Historical Provider, MD  nicotine polacrilex (NICORETTE) 4 MG gum Take 4 mg by mouth as needed for smoking cessation.   Yes Historical Provider, MD  ALBUTEROL IN Inhale into the lungs.    Historical Provider, MD  Ascorbic Acid (VITAMIN C PO) Take by mouth.    Historical Provider, MD  cephALEXin (KEFLEX) 500 MG capsule Take 1 capsule (500 mg total) by mouth 3 (three) times daily. Patient not taking: Reported on 02/05/2015 08/18/14   Mathis Fare Presson, PA  CLONIDINE HCL PO Take by mouth.    Historical Provider, MD  LEVOTHYROXINE SODIUM PO Take by mouth.    Historical  Provider, MD  LISINOPRIL PO Take by mouth.    Historical Provider, MD  Multiple Vitamin (MULTIVITAMIN) capsule Take 1 capsule by mouth daily.    Historical Provider, MD    Family History  Family History  Problem Relation Age of Onset  . Diabetes Brother     Social History  Social History   Social History  . Marital Status: Widowed    Spouse Name: N/A  . Number of Children: N/A  . Years of Education: N/A   Occupational History  . Not on file.   Social History Main  Topics  . Smoking status: Current Every Day Smoker -- 2.00 packs/day for 60 years  . Smokeless tobacco: Not on file  . Alcohol Use: Yes  . Drug Use: No  . Sexual Activity: Not on file   Other Topics Concern  . Not on file   Social History Narrative     Review of Systems General:  No chills, fever, night sweats or weight changes.  Cardiovascular:  No chest pain, dyspnea on exertion, edema, orthopnea, palpitations, paroxysmal nocturnal dyspnea. Dermatological: No rash, lesions/masses Respiratory: No cough, dyspnea Urologic: No hematuria, dysuria Abdominal:   No nausea, vomiting, diarrhea, bright red blood per rectum, melena, or hematemesis Neurologic:  No visual changes, wkns, changes in mental status. All other systems reviewed and are otherwise negative except as noted above.  Physical Exam  Blood pressure 177/84, pulse 92, temperature 98.4 F (36.9 C), temperature source Oral, resp. rate 20, SpO2 96 %.  General: Pleasant, NAD Psych: Normal affect. Neuro: Alert and oriented X 3. Limited mobility of right hip. HEENT: Normal  Neck: Supple without bruits or JVD. Lungs:  Resp regular and unlabored, CTA. Heart: RRR no s3, s4, or murmurs. Abdomen: Soft, non-tender, non-distended, BS + x 4.  Extremities: No clubbing, cyanosis or edema. DP/PT/Radials 2+ and equal bilaterally.  Labs  Troponin (Point of Care Test) No results for input(s): TROPIPOC in the last 72 hours. No results for input(s): CKTOTAL, CKMB, TROPONINI in the last 72 hours. Lab Results  Component Value Date   WBC 13.8* 02/05/2015   HGB 13.1 02/05/2015   HCT 38.6 02/05/2015   MCV 97.7 02/05/2015   PLT 202 02/05/2015     Recent Labs Lab 02/05/15 0538  NA 134*  K 3.5  CL 99*  CO2 24  BUN 11  CREATININE 0.59  CALCIUM 8.7*  GLUCOSE 284*   No results found for: CHOL, HDL, LDLCALC, TRIG No results found for: DDIMER   Radiology/Studies  Ct Head Wo Contrast  02/05/2015   CLINICAL DATA:  Fall on blood  thinners. Head injury. Initial encounter.  EXAM: CT HEAD WITHOUT CONTRAST  TECHNIQUE: Contiguous axial images were obtained from the base of the skull through the vertex without intravenous contrast.  COMPARISON:  None.  FINDINGS: Skull and Sinuses:Lucency in the right low occipital bone is most consistent with a vascular channel. No acute fracture suspected.  Patchy mucosal thickening in the bilateral paranasal sinuses, greatest in the ethmoids. No sinus effusion.  Orbits: No traumatic finding.  Bilateral cataract resection.  Brain: No evidence of intracranial injury. No evidence of acute infarction, hemorrhage, hydrocephalus, or mass lesion/mass effect. Chronic small-vessel disease with ischemic gliosis throughout the periventricular white matter. Remote lacunar infarct present in the left centrum semiovale. Cortical atrophy, mild for age.  IMPRESSION: 1. No intracranial injury or fracture. 2. Cortical atrophy and moderate chronic small vessel disease. 3. Chronic sinusitis.   Electronically Signed   By: Marja Kays  Watts M.D.   On: 02/05/2015 06:47   Dg Chest Port 1 View  02/05/2015   CLINICAL DATA:  Pre-operative evaluation for hip fracture.  EXAM: PORTABLE CHEST - 1 VIEW  COMPARISON:  08/07/2004  FINDINGS: Prominent central vascular markings with some peribronchial thickening. No focal airspace disease. Heart size is within normal limits. Slightly low lung volumes compared to the previous examination. The trachea is midline. Negative for a pneumothorax. Bony thorax is intact. There appears to be a 5 mm nodule at the left lung base which is likely stable from 2006.  IMPRESSION: Prominent central vascular structures with mild peribronchial thickening. Findings are suggestive for vascular congestion or mild edema.   Electronically Signed   By: Richarda Overlie M.D.   On: 02/05/2015 09:02   Dg Knee Complete 4 Views Right  02/05/2015   CLINICAL DATA:  Larey Seat while picking up a pillow, landed on RIGHT side. RIGHT knee  pain and swelling.  EXAM: RIGHT KNEE - COMPLETE 4+ VIEW  COMPARISON:  None.  FINDINGS: Limited examination due to patient positioning. No acute fracture deformity. No dislocation. Mild suspected medial compartment osteoarthrosis. Moderate vascular calcifications. Soft tissue planes are nonsuspicious.  IMPRESSION: No acute fracture deformity or dislocation.  Osteopenia, decreasing sensitivity for acute nondisplaced fractures.   Electronically Signed   By: Awilda Metro M.D.   On: 02/05/2015 05:02   Dg Hip Unilat  With Pelvis 2-3 Views Right  02/05/2015   CLINICAL DATA:  Larey Seat while picking up a pillow, landed on RIGHT side. RIGHT knee pain and swelling.  EXAM: DG HIP (WITH OR WITHOUT PELVIS) 2-3V RIGHT  COMPARISON:  None.  FINDINGS: Oblique nondisplaced RIGHT femur intertrochanteric fracture extending through the greater and lesser trochanter. Femoral heads are well formed and located. No dislocation. No destructive bony lesions. Small RIGHT hip effusion. Moderate aortoiliac vascular calcifications.  IMPRESSION: Nondisplaced acute RIGHT femur intertrochanteric fracture. No dislocation.   Electronically Signed   By: Awilda Metro M.D.   On: 02/05/2015 05:05    ECG  Diffuse ST depressions in the inferior and lateral leads    ASSESSMENT AND PLAN  Principal Problem:   Hip fracture Active Problems:   Closed right hip fracture   Abnormal EKG   Tobacco abuse   DM2 (diabetes mellitus, type 2)   Essential hypertension   Hypothyroidism   1. Abnormal EKG: Patient's EKG demonstrates inferior and lateral ST depressions. No prior EKGs to compare to. Given her multiple risk factors including IDDM, 60+ year h/o ongoing tobacco abuse, HTN, abnormal EKG and previous symptoms of chest/ back pain and DOE, she likely has underlying CAD. She denies any current CP. Agree with transfer to [facility]. Will continue w/u. Will order 2D echo to assess LV systolic function, wall motion and valve anatomy. Will determine  additional ischemic w/u based on echo findings. Stress test vs LHC. Given concerns for underlying CAD, would add a statin and BB to her medical regimen. Screen for HLD with FLP in the am.   2. Right Hip Fx: subsequent to mechanical fall. Unfortunately, we cannot clear for surgery at this time. Given her multiple risk factors including IDDM, 60+ year h/o ongoing tobacco abuse, HTN, abnormal EKG and previous symptoms of chest/ back pain and DOE, she likely has underlying CAD. Will need further cardiac w/u before surgical clearance is granted.   3. IDDM: management per Internal Medicine.  4. HTN: currently elevated in the 170s systolic. Continue home lisinopril. Add BB.    5. Tobacco Abuse:  smoking cessation strongly advised.     Signed, Robbie Lis, PA-C 02/05/2015, 9:07 AM  Patient examined chart reviewed.  Long discussion with patient and daughters.  Exam remarkable for chronically ill white female.  Right carotid bruit.  She is a long standing IDDM, and smoker.  ECG with relative tachycardia worrisome for ischemia.  Non displaced right femoral neck fracture Favor diagnostic cath to day to r/o CAD along with echo and carotid duplex.  Start beta blocker and nitrates. Risks of cath including stroke discussed with patient and family willing to proceed.  Charlton Haws

## 2015-02-06 ENCOUNTER — Inpatient Hospital Stay (HOSPITAL_COMMUNITY): Payer: Medicare Other | Admitting: Certified Registered Nurse Anesthetist

## 2015-02-06 ENCOUNTER — Inpatient Hospital Stay (HOSPITAL_COMMUNITY): Payer: Medicare Other

## 2015-02-06 ENCOUNTER — Encounter (HOSPITAL_COMMUNITY)
Admission: EM | Disposition: A | Discharge status: Skilled Nursing Facility | Payer: Medicare Other | Source: Home / Self Care | Attending: Internal Medicine

## 2015-02-06 DIAGNOSIS — E1165 Type 2 diabetes mellitus with hyperglycemia: Secondary | ICD-10-CM

## 2015-02-06 DIAGNOSIS — S72001K Fracture of unspecified part of neck of right femur, subsequent encounter for closed fracture with nonunion: Secondary | ICD-10-CM

## 2015-02-06 DIAGNOSIS — S72001D Fracture of unspecified part of neck of right femur, subsequent encounter for closed fracture with routine healing: Secondary | ICD-10-CM

## 2015-02-06 HISTORY — PX: INTRAMEDULLARY (IM) NAIL INTERTROCHANTERIC: SHX5875

## 2015-02-06 LAB — GLUCOSE, CAPILLARY
GLUCOSE-CAPILLARY: 107 mg/dL — AB (ref 65–99)
GLUCOSE-CAPILLARY: 141 mg/dL — AB (ref 65–99)
GLUCOSE-CAPILLARY: 174 mg/dL — AB (ref 65–99)
GLUCOSE-CAPILLARY: 177 mg/dL — AB (ref 65–99)
GLUCOSE-CAPILLARY: 205 mg/dL — AB (ref 65–99)
Glucose-Capillary: 225 mg/dL — ABNORMAL HIGH (ref 65–99)
Glucose-Capillary: 234 mg/dL — ABNORMAL HIGH (ref 65–99)

## 2015-02-06 LAB — BASIC METABOLIC PANEL
ANION GAP: 9 (ref 5–15)
BUN: 12 mg/dL (ref 6–20)
CALCIUM: 8.6 mg/dL — AB (ref 8.9–10.3)
CO2: 24 mmol/L (ref 22–32)
CREATININE: 0.64 mg/dL (ref 0.44–1.00)
Chloride: 100 mmol/L — ABNORMAL LOW (ref 101–111)
GLUCOSE: 225 mg/dL — AB (ref 65–99)
Potassium: 3.7 mmol/L (ref 3.5–5.1)
Sodium: 133 mmol/L — ABNORMAL LOW (ref 135–145)

## 2015-02-06 LAB — CBC
HCT: 36.4 % (ref 36.0–46.0)
HEMATOCRIT: 35.7 % — AB (ref 36.0–46.0)
HEMOGLOBIN: 12.2 g/dL (ref 12.0–15.0)
Hemoglobin: 12.5 g/dL (ref 12.0–15.0)
MCH: 33.3 pg (ref 26.0–34.0)
MCH: 34 pg (ref 26.0–34.0)
MCHC: 34.3 g/dL (ref 30.0–36.0)
MCHC: 34.2 g/dL (ref 30.0–36.0)
MCV: 97.1 fL (ref 78.0–100.0)
MCV: 99.4 fL (ref 78.0–100.0)
PLATELETS: 181 10*3/uL (ref 150–400)
Platelets: 175 10*3/uL (ref 150–400)
RBC: 3.75 MIL/uL — ABNORMAL LOW (ref 3.87–5.11)
RBC: 3.59 MIL/uL — ABNORMAL LOW (ref 3.87–5.11)
RDW: 12.5 % (ref 11.5–15.5)
RDW: 12.4 % (ref 11.5–15.5)
WBC: 8.8 10*3/uL (ref 4.0–10.5)
WBC: 15.7 10*3/uL — AB (ref 4.0–10.5)

## 2015-02-06 LAB — MRSA PCR SCREENING: MRSA BY PCR: NEGATIVE

## 2015-02-06 LAB — CREATININE, SERUM
Creatinine, Ser: 0.75 mg/dL (ref 0.44–1.00)
GFR calc non Af Amer: >60 mL/min (ref >60)

## 2015-02-06 SURGERY — FIXATION, FRACTURE, INTERTROCHANTERIC, WITH INTRAMEDULLARY ROD
Anesthesia: General | Site: Hip | Laterality: Right

## 2015-02-06 MED ORDER — ONDANSETRON HCL 4 MG/2ML IJ SOLN: 4.0000 mg | Freq: Four times a day (QID) | INTRAMUSCULAR | Status: DC | PRN

## 2015-02-06 MED ORDER — LIDOCAINE HCL (CARDIAC) 20 MG/ML IV SOLN
INTRAVENOUS | Status: DC | PRN
Start: 2015-02-06 — End: 2015-02-06
  Administered 2015-02-06: 70 mg via INTRAVENOUS

## 2015-02-06 MED ORDER — METOCLOPRAMIDE HCL 5 MG/ML IJ SOLN: 5.0000 mg | Freq: Three times a day (TID) | INTRAMUSCULAR | Status: DC | PRN

## 2015-02-06 MED ORDER — SODIUM CHLORIDE 0.9 % IV SOLN
INTRAVENOUS | Status: DC
  Administered 2015-02-06 – 2015-02-07 (×2): via INTRAVENOUS

## 2015-02-06 MED ORDER — LACTATED RINGERS IV SOLN
INTRAVENOUS | Status: DC
  Administered 2015-02-06 (×2): via INTRAVENOUS

## 2015-02-06 MED ORDER — FENTANYL CITRATE (PF) 100 MCG/2ML IJ SOLN: 25.0000 ug | INTRAMUSCULAR | Status: DC | PRN

## 2015-02-06 MED ORDER — ROCURONIUM BROMIDE 100 MG/10ML IV SOLN
INTRAVENOUS | Status: DC | PRN
  Administered 2015-02-06: 40 mg via INTRAVENOUS

## 2015-02-06 MED ORDER — ACETAMINOPHEN 325 MG PO TABS: 650.0000 mg | ORAL_TABLET | Freq: Four times a day (QID) | ORAL | Status: DC | PRN

## 2015-02-06 MED ORDER — EPHEDRINE SULFATE 50 MG/ML IJ SOLN
INTRAMUSCULAR | Status: AC
  Filled 2015-02-06: qty 2

## 2015-02-06 MED ORDER — SODIUM CHLORIDE 0.9 % WEIGHT BASED INFUSION
3.0000 mL/kg/h | INTRAVENOUS | Status: AC
Start: 2015-02-07 — End: 2015-02-07

## 2015-02-06 MED ORDER — ONDANSETRON HCL 4 MG PO TABS: 4.0000 mg | ORAL_TABLET | Freq: Four times a day (QID) | ORAL | Status: DC | PRN

## 2015-02-06 MED ORDER — DEXTROSE 5 % IV SOLN
500.0000 mg | Freq: Four times a day (QID) | INTRAVENOUS | Status: DC | PRN
  Filled 2015-02-06: qty 5

## 2015-02-06 MED ORDER — CEFAZOLIN SODIUM-DEXTROSE 2-3 GM-% IV SOLR
INTRAVENOUS | Status: AC
  Filled 2015-02-06: qty 50

## 2015-02-06 MED ORDER — ASPIRIN 81 MG PO CHEW: 81.0000 mg | CHEWABLE_TABLET | ORAL | Status: AC

## 2015-02-06 MED ORDER — FENTANYL CITRATE (PF) 100 MCG/2ML IJ SOLN
INTRAMUSCULAR | Status: DC | PRN
  Administered 2015-02-06 (×3): 50 ug via INTRAVENOUS

## 2015-02-06 MED ORDER — METHOCARBAMOL 500 MG PO TABS: 500.0000 mg | ORAL_TABLET | Freq: Four times a day (QID) | ORAL | Status: DC | PRN

## 2015-02-06 MED ORDER — MORPHINE SULFATE (PF) 2 MG/ML IV SOLN: 0.5000 mg | INTRAVENOUS | Status: DC | PRN

## 2015-02-06 MED ORDER — CEFAZOLIN SODIUM-DEXTROSE 2-3 GM-% IV SOLR
2.0000 g | Freq: Four times a day (QID) | INTRAVENOUS | Status: AC
  Administered 2015-02-07 (×3): 2 g via INTRAVENOUS
  Filled 2015-02-06 (×3): qty 50

## 2015-02-06 MED ORDER — HYDROCODONE-ACETAMINOPHEN 7.5-325 MG PO TABS: 1.0000 | ORAL_TABLET | Freq: Four times a day (QID) | ORAL | Status: DC | PRN

## 2015-02-06 MED ORDER — CEFAZOLIN SODIUM-DEXTROSE 2-3 GM-% IV SOLR
INTRAVENOUS | Status: DC | PRN
  Administered 2015-02-06: 2 g via INTRAVENOUS

## 2015-02-06 MED ORDER — SODIUM CHLORIDE 0.9 % IJ SOLN: 3.0000 mL | Freq: Two times a day (BID) | INTRAMUSCULAR | Status: DC

## 2015-02-06 MED ORDER — SODIUM CHLORIDE 0.9 % IJ SOLN: 3.0000 mL | INTRAMUSCULAR | Status: DC | PRN

## 2015-02-06 MED ORDER — HYDROCODONE-ACETAMINOPHEN 5-325 MG PO TABS
1.0000 | ORAL_TABLET | Freq: Four times a day (QID) | ORAL | Status: DC | PRN
  Administered 2015-02-06 – 2015-02-08 (×3): 1 via ORAL
  Filled 2015-02-06 (×3): qty 1

## 2015-02-06 MED ORDER — FENTANYL CITRATE (PF) 250 MCG/5ML IJ SOLN
INTRAMUSCULAR | Status: AC
Start: 2015-02-06 — End: 2015-02-06
  Filled 2015-02-06: qty 5

## 2015-02-06 MED ORDER — SODIUM CHLORIDE 0.9 % WEIGHT BASED INFUSION: 1.0000 mL/kg/h | INTRAVENOUS | Status: DC

## 2015-02-06 MED ORDER — PHENYLEPHRINE HCL 10 MG/ML IJ SOLN
INTRAMUSCULAR | Status: DC | PRN
  Administered 2015-02-06: 80 ug via INTRAVENOUS

## 2015-02-06 MED ORDER — ACETAMINOPHEN 650 MG RE SUPP
650.0000 mg | Freq: Four times a day (QID) | RECTAL | Status: DC | PRN
Start: 2015-02-06 — End: 2015-02-08

## 2015-02-06 MED ORDER — METOCLOPRAMIDE HCL 5 MG PO TABS: 5.0000 mg | ORAL_TABLET | Freq: Three times a day (TID) | ORAL | Status: DC | PRN

## 2015-02-06 MED ORDER — SUCCINYLCHOLINE CHLORIDE 20 MG/ML IJ SOLN
INTRAMUSCULAR | Status: AC
  Filled 2015-02-06: qty 1

## 2015-02-06 MED ORDER — SODIUM CHLORIDE 0.9 % IV SOLN: 250.0000 mL | INTRAVENOUS | Status: DC | PRN

## 2015-02-06 MED ORDER — ONDANSETRON HCL 4 MG/2ML IJ SOLN
INTRAMUSCULAR | Status: DC | PRN
  Administered 2015-02-06: 4 mg via INTRAVENOUS

## 2015-02-06 MED ORDER — PROPOFOL 10 MG/ML IV BOLUS
INTRAVENOUS | Status: DC | PRN
  Administered 2015-02-06: 170 mg via INTRAVENOUS

## 2015-02-06 MED ORDER — NEOSTIGMINE METHYLSULFATE 10 MG/10ML IV SOLN
INTRAVENOUS | Status: DC | PRN
  Administered 2015-02-06: 4 mg via INTRAVENOUS

## 2015-02-06 MED ORDER — OXYCODONE HCL 5 MG PO TABS: 5.0000 mg | ORAL_TABLET | ORAL | Status: DC | PRN

## 2015-02-06 MED ORDER — PHENOL 1.4 % MT LIQD: 1.0000 | OROMUCOSAL | Status: DC | PRN

## 2015-02-06 MED ORDER — ENOXAPARIN SODIUM 40 MG/0.4ML ~~LOC~~ SOLN: 40.0000 mg | Freq: Every day | SUBCUTANEOUS | Status: DC

## 2015-02-06 MED ORDER — GLYCOPYRROLATE 0.2 MG/ML IJ SOLN
INTRAMUSCULAR | Status: DC | PRN
  Administered 2015-02-06: .6 mg via INTRAVENOUS

## 2015-02-06 MED ORDER — ALUM & MAG HYDROXIDE-SIMETH 200-200-20 MG/5ML PO SUSP: 30.0000 mL | ORAL | Status: DC | PRN

## 2015-02-06 MED ORDER — ENOXAPARIN SODIUM 40 MG/0.4ML ~~LOC~~ SOLN
40.0000 mg | SUBCUTANEOUS | Status: DC
  Administered 2015-02-07 – 2015-02-08 (×2): 40 mg via SUBCUTANEOUS
  Filled 2015-02-06 (×2): qty 0.4

## 2015-02-06 MED ORDER — ONDANSETRON HCL 4 MG/2ML IJ SOLN
INTRAMUSCULAR | Status: AC
  Filled 2015-02-06: qty 4

## 2015-02-06 MED ORDER — MENTHOL 3 MG MT LOZG: 1.0000 | LOZENGE | OROMUCOSAL | Status: DC | PRN

## 2015-02-06 MED ORDER — 0.9 % SODIUM CHLORIDE (POUR BTL) OPTIME
TOPICAL | Status: DC | PRN
  Administered 2015-02-06: 1000 mL

## 2015-02-06 SURGICAL SUPPLY — 45 items
BLADE SURG 15 STRL LF DISP TIS (BLADE) ×1 IMPLANT
BLADE SURG 15 STRL SS (BLADE) ×3
BNDG COHESIVE 4X5 TAN NS LF (GAUZE/BANDAGES/DRESSINGS) ×3 IMPLANT
BNDG COHESIVE 6X5 TAN STRL LF (GAUZE/BANDAGES/DRESSINGS) IMPLANT
BNDG GAUZE ELAST 4 BULKY (GAUZE/BANDAGES/DRESSINGS) ×3 IMPLANT
COVER PERINEAL POST (MISCELLANEOUS) ×3 IMPLANT
COVER SURGICAL LIGHT HANDLE (MISCELLANEOUS) ×3 IMPLANT
DRAPE PROXIMA HALF (DRAPES) IMPLANT
DRAPE STERI IOBAN 125X83 (DRAPES) ×3 IMPLANT
DRILL STARTER 7.0MM (BIT) ×2 IMPLANT
DRSG MEPILEX BORDER 4X4 (GAUZE/BANDAGES/DRESSINGS) ×3 IMPLANT
DRSG MEPILEX BORDER 4X8 (GAUZE/BANDAGES/DRESSINGS) ×3 IMPLANT
DRSG PAD ABDOMINAL 8X10 ST (GAUZE/BANDAGES/DRESSINGS) ×6 IMPLANT
DURAPREP 26ML APPLICATOR (WOUND CARE) ×3 IMPLANT
ELECT CAUTERY BLADE 6.4 (BLADE) ×3 IMPLANT
ELECT REM PT RETURN 9FT ADLT (ELECTROSURGICAL) ×3
ELECTRODE REM PT RTRN 9FT ADLT (ELECTROSURGICAL) ×1 IMPLANT
FACESHIELD WRAPAROUND (MASK) ×3 IMPLANT
FACESHIELD WRAPAROUND OR TEAM (MASK) ×1 IMPLANT
GAUZE XEROFORM 5X9 LF (GAUZE/BANDAGES/DRESSINGS) ×3 IMPLANT
GLOVE NEODERM STRL 7.5 LF PF (GLOVE) ×2 IMPLANT
GLOVE SURG NEODERM 7.5  LF PF (GLOVE) ×4
GOWN STRL REIN XL XLG (GOWN DISPOSABLE) ×3 IMPLANT
GUIDE PIN 3.2X343 (PIN) ×1
GUIDE PIN 3.2X343MM (PIN) ×3
KIT BASIN OR (CUSTOM PROCEDURE TRAY) ×3 IMPLANT
KIT ROOM TURNOVER OR (KITS) ×3 IMPLANT
LINER BOOT UNIVERSAL DISP (MISCELLANEOUS) ×3 IMPLANT
MANIFOLD NEPTUNE II (INSTRUMENTS) ×3 IMPLANT
NAIL TRIGEN INTERT 11.5X40-125 (Nail) ×2 IMPLANT
NS IRRIG 1000ML POUR BTL (IV SOLUTION) ×3 IMPLANT
PACK GENERAL/GYN (CUSTOM PROCEDURE TRAY) ×3 IMPLANT
PAD ARMBOARD 7.5X6 YLW CONV (MISCELLANEOUS) ×6 IMPLANT
PAD CAST 4YDX4 CTTN HI CHSV (CAST SUPPLIES) ×2 IMPLANT
PADDING CAST COTTON 4X4 STRL (CAST SUPPLIES) ×6
PIN GUIDE 3.2X343MM (PIN) IMPLANT
SCREW LAG COMPR KIT 95/90 (Screw) ×2 IMPLANT
STAPLER VISISTAT 35W (STAPLE) ×3 IMPLANT
SUT VIC AB 0 CT1 27 (SUTURE) ×6
SUT VIC AB 0 CT1 27XBRD ANBCTR (SUTURE) ×2 IMPLANT
SUT VIC AB 2-0 CT1 27 (SUTURE) ×6
SUT VIC AB 2-0 CT1 TAPERPNT 27 (SUTURE) ×2 IMPLANT
TOWEL OR 17X24 6PK STRL BLUE (TOWEL DISPOSABLE) ×3 IMPLANT
TOWEL OR 17X26 10 PK STRL BLUE (TOWEL DISPOSABLE) ×3 IMPLANT
WATER STERILE IRR 1000ML POUR (IV SOLUTION) ×3 IMPLANT

## 2015-02-06 NOTE — Progress Notes (Signed)
Initial Nutrition Assessment   INTERVENTION:   Supplement diet as appropriate   NUTRITION DIAGNOSIS:   Increased nutrient needs related to  (surgery) as evidenced by estimated needs.   GOAL:   Patient will meet greater than or equal to 90% of their needs   MONITOR:   PO intake, Supplement acceptance, Diet advancement, I & O's  REASON FOR ASSESSMENT:   Consult Hip fracture protocol  ASSESSMENT:   Pt with hx of DM and ETOH abuse admitted after fall at home with hip fx. Surgical fixation 8/17.   Pt being cleaned up prior to surgery and unable to speak with her or complete the nutrition focused physical exam at this time.  Was able to speak with pt's daughter who reports that pt lives alone and does her own cooking. Pt is leagally blind but unsure at this time how it impacts her ability to cook for herself. Per daughter she is unable to help due to having 2 small kids, her brother lives 2 doors down but he does not provide meals/food.  She sleeps a lot during the day and is up at night. Daughter believes pt has 2 meals per day, a very late breakfast and then supper, she is unsure what she eats. She admits to drinking 2 cups of wine per day per RN, however daughter feels that it is more and knows that pt goes through a gallon of wine per week (avg 2 1/4 cups per day, 7 days a week).  Pt does not drink oral nutrition supplements at home.   Medications reviewed and include: thiamine, folic acid, MVI Labs reviewed: cbg's: 177-225   Diet Order:  Diet NPO time specified  Skin:  Reviewed, no issues  Last BM:  8/16  Height:   Ht Readings from Last 1 Encounters:  02/06/15  (1.549 m)    Weight:   Wt Readings from Last 1 Encounters:  02/06/15 148 lb 1.6 oz (67.178 kg)    Ideal Body Weight:  47.7 kg  BMI:  Body mass index is 28 kg/(m^2).  Estimated Nutritional Needs:   Kcal:  1600-1800  Protein:  75-85 grams  Fluid:  >/= 1.6 L/day  EDUCATION NEEDS:   No  education needs identified at this time  Kendell Bane RD, LDN, CNSC [phone] Pager [phone] After Hours Pager

## 2015-02-06 NOTE — Anesthesia Procedure Notes (Signed)
Procedure Name: Intubation Date/Time: 02/06/2015 4:21 PM Performed by: Dairl Ponder Pre-anesthesia Checklist: Patient identified, Emergency Drugs available, Suction available, Patient being monitored and Timeout performed Patient Re-evaluated:Patient Re-evaluated prior to inductionOxygen Delivery Method: Circle system utilized Preoxygenation: Pre-oxygenation with 100% oxygen Intubation Type: IV induction Ventilation: Mask ventilation without difficulty and Oral airway inserted - appropriate to patient size Laryngoscope Size: Mac and 3 Grade View: Grade I Tube type: Oral Tube size: 7.5 mm Number of attempts: 1 Airway Equipment and Method: Stylet Placement Confirmation: ETT inserted through vocal cords under direct vision,  positive ETCO2 and breath sounds checked- equal and bilateral Secured at: 22 cm Tube secured with: Tape Dental Injury: Teeth and Oropharynx as per pre-operative assessment

## 2015-02-06 NOTE — Progress Notes (Signed)
Received call from OR to get pt ready for surgery. MRSA neg, CHG bath done. Family aware and has all of pt's belongings/dentures.

## 2015-02-06 NOTE — H&P (Signed)
H&P update  The surgical history has been reviewed and remains accurate without interval change.  The patient was re-examined and patient's physiologic condition has not changed significantly in the last 30 days. The condition still exists that makes this procedure necessary. The treatment plan remains the same, without new options for care.  No new pharmacological allergies or types of therapy has been initiated that would change the plan or the appropriateness of the plan.  The patient and/or family understand the potential benefits and risks.  Mayra Reel, MD 02/06/2015 10:21 AM

## 2015-02-06 NOTE — Transfer of Care (Signed)
Immediate Anesthesia Transfer of Care Note  Patient: Tara Flores  Procedure(s) Performed: Procedure(s): INTRAMEDULLARY (IM) NAIL RIGHT HIP (Right)  Patient Location: PACU  Anesthesia Type:General  Level of Consciousness: awake, alert  and oriented  Airway & Oxygen Therapy: Patient Spontanous Breathing and Patient connected to face mask oxygen  Post-op Assessment: Report given to RN and Post -op Vital signs reviewed and stable  Post vital signs: Reviewed and stable  Last Vitals:  Filed Vitals:   02/06/15 1227  BP: 119/53  Pulse: 70  Temp: 36.8 C  Resp: 19    Complications: No apparent anesthesia complications

## 2015-02-06 NOTE — Progress Notes (Addendum)
PROGRESS NOTE  Tara Flores ZOX:[id number] DOB: March 18, 1936 DOA: 02/05/2015 PCP: No primary care provider on file.  Brief history 79 year old female with a history of diabetes mellitus, hypertension, hypothyroidism, continued tobacco abuse, alcohol dependence resented with mechanical fall. Workup revealed right femoral neck fracture. Orthopedics was consulted. The patient had an abnormal EKG with inferior and lateral lead ST depression. Cardiology was consulted. The patient underwent cardiac catheterization 02/05/2015 which revealed triple-vessel coronary artery disease with mild to moderate diffuse disease(30%) in the heavily calcified RCA and Circumflex system. Moderate heavily calcified stenosis mid LAD(60%).  Assessment/Plan: Right femoral neck fracture -Cardiology has cleared the patient for surgical intervention -appreciate Dr. Roda Shutters -plans noted for surgery on 02/06/15 -PT/OT after surgery Coronary artery disease involving native coronary artery of native heart without angina pectoris - cardiac catheterization 02/05/2015 which revealed triple-vessel coronary artery disease with mild to moderate diffuse disease(30%) in the heavily calcified RCA and Circumflex system. Moderate heavily calcified stenosis mid LAD(60%) -Medical management recommended -Continue aspirin and statin -02/05/2015 echocardiogram EF 60-65%, grade 1 diastolic dysfunction, n WMA -02/05/2015 carotid duplex--no hemodynamically significant stenosis Diabetes mellitus type 2 -pt takes Novolin N--20 units am, 5-6 units pm -novolog sliding scale for now while npo -Check A1C Hypertension  -Continue lisinopril  -Wean off clonidine  -Continue carvedilol per cardiology  Tobacco abuse  -Patient has >60 pack year hx -stable on 2L -will need ambulatory pulseox prior to d/c Hypothyroidism  -Continue Synthroid  Alcohol dependence  -Watch for signs of withdraw    Family Communication:   Daughters updated at  beside Disposition Plan:   Home when medically stable       Procedures/Studies: Ct Head Wo Contrast  02/05/2015   CLINICAL DATA:  Fall on blood thinners. Head injury. Initial encounter.  EXAM: CT HEAD WITHOUT CONTRAST  TECHNIQUE: Contiguous axial images were obtained from the base of the skull through the vertex without intravenous contrast.  COMPARISON:  None.  FINDINGS: Skull and Sinuses:Lucency in the right low occipital bone is most consistent with a vascular channel. No acute fracture suspected.  Patchy mucosal thickening in the bilateral paranasal sinuses, greatest in the ethmoids. No sinus effusion.  Orbits: No traumatic finding.  Bilateral cataract resection.  Brain: No evidence of intracranial injury. No evidence of acute infarction, hemorrhage, hydrocephalus, or mass lesion/mass effect. Chronic small-vessel disease with ischemic gliosis throughout the periventricular white matter. Remote lacunar infarct present in the left centrum semiovale. Cortical atrophy, mild for age.  IMPRESSION: 1. No intracranial injury or fracture. 2. Cortical atrophy and moderate chronic small vessel disease. 3. Chronic sinusitis.   Electronically Signed   By: Marnee Spring M.D.   On: 02/05/2015 06:47   Dg Chest Port 1 View  02/05/2015   CLINICAL DATA:  Pre-operative evaluation for hip fracture.  EXAM: PORTABLE CHEST - 1 VIEW  COMPARISON:  08/07/2004  FINDINGS: Prominent central vascular markings with some peribronchial thickening. No focal airspace disease. Heart size is within normal limits. Slightly low lung volumes compared to the previous examination. The trachea is midline. Negative for a pneumothorax. Bony thorax is intact. There appears to be a 5 mm nodule at the left lung base which is likely stable from 2006.  IMPRESSION: Prominent central vascular structures with mild peribronchial thickening. Findings are suggestive for vascular congestion or mild edema.   Electronically Signed   By: Richarda Overlie M.D.    On: 02/05/2015 09:02   Dg Knee Complete 4 Views  Right  02/05/2015   CLINICAL DATA:  Larey Seat while picking up a pillow, landed on RIGHT side. RIGHT knee pain and swelling.  EXAM: RIGHT KNEE - COMPLETE 4+ VIEW  COMPARISON:  None.  FINDINGS: Limited examination due to patient positioning. No acute fracture deformity. No dislocation. Mild suspected medial compartment osteoarthrosis. Moderate vascular calcifications. Soft tissue planes are nonsuspicious.  IMPRESSION: No acute fracture deformity or dislocation.  Osteopenia, decreasing sensitivity for acute nondisplaced fractures.   Electronically Signed   By: Awilda Metro M.D.   On: 02/05/2015 05:02   Dg Hip Unilat  With Pelvis 2-3 Views Right  02/05/2015   CLINICAL DATA:  Larey Seat while picking up a pillow, landed on RIGHT side. RIGHT knee pain and swelling.  EXAM: DG HIP (WITH OR WITHOUT PELVIS) 2-3V RIGHT  COMPARISON:  None.  FINDINGS: Oblique nondisplaced RIGHT femur intertrochanteric fracture extending through the greater and lesser trochanter. Femoral heads are well formed and located. No dislocation. No destructive bony lesions. Small RIGHT hip effusion. Moderate aortoiliac vascular calcifications.  IMPRESSION: Nondisplaced acute RIGHT femur intertrochanteric fracture. No dislocation.   Electronically Signed   By: Awilda Metro M.D.   On: 02/05/2015 05:05         Subjective: Patient denies fevers, chills, headache, chest pain, dyspnea, nausea, vomiting, diarrhea, abdominal pain, dysuria, hematuria   Objective: Filed Vitals:   02/06/15 0010 02/06/15 0410 02/06/15 0600 02/06/15 0814  BP: 131/49 109/43  125/49  Pulse: 80 69 80 86  Temp:  98.3 F (36.8 C)  98.3 F (36.8 C)  TempSrc:  Oral  Oral  Resp:  19  19  Weight:  66.2 kg (145 lb 15.1 oz)    SpO2:  98%  93%    Intake/Output Summary (Last 24 hours) at 02/06/15 0945 Last data filed at 02/06/15 1610  Gross per 24 hour  Intake    990 ml  Output   4050 ml  Net  -3060 ml   Weight  change:  Exam:   General:  Pt is alert, follows commands appropriately, not in acute distress  HEENT: No icterus, No thrush, No neck mass, [place]/AT  Cardiovascular: RRR, S1/S2, no rubs, no gallops  Respiratory: CTA bilaterally, no wheezing, no crackles, no rhonchi  Abdomen: Soft/+BS, non tender, non distended, no guarding  Extremities: No edema, No lymphangitis, No petechiae, No rashes, no synovitis  Data Reviewed: Basic Metabolic Panel:  Recent Labs Lab 02/05/15 0538 02/06/15 0346  NA 134* 133*  K 3.5 3.7  CL 99* 100*  CO2 24 24  GLUCOSE 284* 225*  BUN 11 12  CREATININE 0.59 0.64  CALCIUM 8.7* 8.6*   Liver Function Tests: No results for input(s): AST, ALT, ALKPHOS, BILITOT, PROT, ALBUMIN in the last 168 hours. No results for input(s): LIPASE, AMYLASE in the last 168 hours. No results for input(s): AMMONIA in the last 168 hours. CBC:  Recent Labs Lab 02/05/15 0538 02/06/15 0346  WBC 13.8* 8.8  NEUTROABS 12.1*  --   HGB 13.1 12.5  HCT 38.6 36.4  MCV 97.7 97.1  PLT 202 181   Cardiac Enzymes: No results for input(s): CKTOTAL, CKMB, CKMBINDEX, TROPONINI in the last 168 hours. BNP: Invalid input(s): POCBNP CBG:  Recent Labs Lab 02/05/15 1721 02/05/15 2040 02/05/15 2355 02/06/15 0347 02/06/15 0836  GLUCAP 223* 165* 107* 205* 225*    No results found for this or any previous visit (from the past 240 hour(s)).   Scheduled Meds: . carvedilol  6.25 mg Oral BID WC  .  cloNIDine  0.1 mg Oral BID  . docusate sodium  100 mg Oral BID  . folic acid  1 mg Oral Daily  . insulin aspart  0-15 Units Subcutaneous 6 times per day  . levothyroxine  137 mcg Oral QAC breakfast  . lisinopril  20 mg Oral Daily  . multivitamin with minerals  1 tablet Oral Daily  . nicotine  14 mg Transdermal Daily  . thiamine  100 mg Oral Daily   Or  . thiamine  100 mg Intravenous Daily   Continuous Infusions: . sodium chloride 50 mL/hr at 02/06/15 0500     Delpha Perko, DO  [facility] Pager [phone]  If 7PM-7AM, please contact night-coverage [email or web address] Password [email or web address] 02/06/2015, 9:45 AM   LOS: 1 day

## 2015-02-06 NOTE — Progress Notes (Signed)
Subjective: Hip pain Lethargic   Objective: Vital signs in last 24 hours: Temp:  [97.9 F (36.6 C)-98.3 F (36.8 C)] 98.3 F (36.8 C) (08/17 0410) Pulse Rate:  [0-98] 80 (08/17 0600) Resp:  [0-86] 19 (08/17 0410) BP: (109-199)/(43-101) 109/43 mmHg (08/17 0410) SpO2:  [0 %-100 %] 98 % (08/17 0410) Weight:  [145 lb (65.772 kg)-145 lb 15.1 oz (66.2 kg)] 145 lb 15.1 oz (66.2 kg) (08/17 0410) Last BM Date: 02/05/15  Intake/Output from previous day: 08/16 0701 - 08/17 0700 In: 2990 [P.O.:240; I.V.:2750] Out: 5150 [Urine:5150] Intake/Output this shift:    Medications Scheduled Meds: . carvedilol  6.25 mg Oral BID WC  . cloNIDine  0.1 mg Oral BID  . docusate sodium  100 mg Oral BID  . folic acid  1 mg Oral Daily  . insulin aspart  0-15 Units Subcutaneous 6 times per day  . levothyroxine  137 mcg Oral QAC breakfast  . lisinopril  20 mg Oral Daily  . multivitamin with minerals  1 tablet Oral Daily  . nicotine  14 mg Transdermal Daily  . thiamine  100 mg Oral Daily   Or  . thiamine  100 mg Intravenous Daily   Continuous Infusions: . sodium chloride 50 mL/hr at 02/06/15 0500   PRN Meds:.acetaminophen, albuterol, HYDROcodone-acetaminophen, HYDROmorphone (DILAUDID) injection, LORazepam **OR** LORazepam, ondansetron (ZOFRAN) IV, polyethylene glycol, promethazine  PE: Affect appropriate Chronically ill white female  HEENT: normal Neck supple with no adenopathy JVP normal right  bruits no thyromegaly Lungs clear with no wheezing and good diaphragmatic motion Heart:  S1/S2 SEM  murmur, no rub, gallop or click PMI normal Abdomen: benighn, BS positve, no tenderness, no AAA no bruit.  No HSM or HJR Distal pulses intact with no bruits No edema Neuro non-focal Skin warm and dry Right femur fracture    Lab Results:   Recent Labs  02/05/15 0538 02/06/15 0346  WBC 13.8* 8.8  HGB 13.1 12.5  HCT 38.6 36.4  PLT 202 181   BMET  Recent Labs  02/05/15 0538  02/06/15 0346  NA 134* 133*  K 3.5 3.7  CL 99* 100*  CO2 24 24  GLUCOSE 284* 225*  BUN 11 12  CREATININE 0.59 0.64  CALCIUM 8.7* 8.6*   PT/INR  Recent Labs  02/05/15 0538  LABPROT 13.5  INR 1.01   Lipid Panel  No results found for: CHOL, TRIG, HDL, CHOLHDL, VLDL, LDLCALC, LDLDIRECT  Echo Study Conclusions  - Left ventricle: The cavity size was normal. Wall thickness was increased in a pattern of mild LVH. Systolic function was normal. The estimated ejection fraction was in the range of 60% to 65%. Wall motion was normal; there were no regional wall motion abnormalities. Doppler parameters are consistent with abnormal left ventricular relaxation (grade 1 diastolic dysfunction). - Aortic valve: Poorly visualized. Probably trileaflet; mildly calcified leaflets. There was no stenosis. - Mitral valve: Mildly calcified annulus. There was no significant regurgitation. - Left atrium: The atrium was moderately dilated. - Right ventricle: The cavity size was normal. Systolic function was normal. - Right atrium: Prominent Eustachian valve. - Pulmonary arteries: No complete TR doppler jet so unable to estimate PA systolic pressure. - Inferior vena cava: The vessel was normal in size. The respirophasic diameter changes were in the normal range (= 50%), consistent with normal central venous pressure.  Impressions:  - Normal LV size with mild LV hypertrophy. EF 60-65%. Normal RV size and systolic function. Moderate LAE. No significant valvular abnormalities.  Assessment/Plan 79 year old female with no known history of coronary disease but with multiple risk factors including hypertension, diabetes and tobacco abuse, admitted for hip fracture in the setting of mechanical fall, being evaluated for surgical clearance  Principal Problem:   Hip fracture Active Problems:   Closed right hip fracture   Abnormal EKG   Tobacco abuse   DM2 (diabetes  mellitus, type 2)   Essential hypertension   Hypothyroidism   Coronary artery disease involving native coronary artery of native heart without angina pectoris  She underwent a left heart cath revealing three vessel CAD with mild to moderate diffuse disease(30%) in the heavily calcified RCA and Circumflex system. Moderate heavily calcified stenosis mid LAD(60%). Fractional flow reserve 0.81 suggesting the stenosis is moderate and not flow limiting.  Normal LV systolic function. Medical management recommended.  She is on beta blocker, ACE-I.   EF 60-65%, G1DD.  Will add ASA and statin.  Check lipids.    Clear to have surgery. Moderate CAD started on beta blocker.  No active angina and normal flow wire down LAD Carotid study reviewed and no high grade stenosis .  EF normal by Echo with no significant valve disease  Plan for OR this afternoon.   LOS: 1 day    HAGER, BRYAN PA-C 02/06/2015 8:07 AM

## 2015-02-06 NOTE — Op Note (Addendum)
   Date of Surgery: 02/06/2015  INDICATIONS: [name] is a 79 y.o.-year-old female who was involved in a ground level fall and sustained a right hip fracture. The risks and benefits of the procedure discussed with the patient prior to the procedure and all questions were answered; consent was obtained.  PREOPERATIVE DIAGNOSIS: right hip fracture (intertrochanteric-type)   POSTOPERATIVE DIAGNOSIS: Same   PROCEDURE: Treatment of intertrochanteric fracture with intramedullary implant. CPT [phone]   SURGEON: N. Glee Arvin, M.D.   ANESTHESIA: general   IV FLUIDS AND URINE: See anesthesia record   ESTIMATED BLOOD LOSS: 200 cc  IMPLANTS: [employer] InterTAN 11.5 x 40, 95/90  DRAINS: None.   COMPLICATIONS: None.   DESCRIPTION OF PROCEDURE: The patient was brought to the operating room and placed supine on the operating table. The patient's leg had been signed prior to the procedure. The patient had the anesthesia placed by the anesthesiologist. The prep verification and incision time-outs were performed to confirm that this was the correct patient, site, side and location. The patient had an SCD on the opposite lower extremity. The patient did receive antibiotics prior to the incision and was re-dosed during the procedure as needed at indicated intervals. The patient was positioned on the fracture table with the table in traction and internal rotation to reduce the hip. The well leg was placed in a scissor position and all bony prominences were well-padded. The patient had the lower extremity prepped and draped in the standard surgical fashion. The incision was made 4 finger breadths superior to the greater trochanter. A guide pin was inserted into the tip of the greater trochanter under fluoroscopic guidance. An opening reamer was used to gain access to the femoral canal. The nail length was measured and inserted down the femoral canal to its proper depth. The appropriate version of insertion  for the lag screw was found under fluoroscopy. A pin was inserted up the femoral neck through the jig. Then, a second antirotation pin was inserted inferior to the first pin. The length of the lag screw was then measured. The lag screw was inserted as near to center-center in the head as possible. The antirotation pin was then taken out and an interdigitating compression screw was placed in its place. The leg was taken out of traction, then the interdigitating compression screw was used to compress across the fracture. Compression was visualized on serial xrays. The wound was copiously irrigated with saline and the subcutaneous layer closed with 2.0 vicryl and the skin was reapproximated with staples. The wounds were cleaned and dried a final time and a sterile dressing was placed. The hip was taken through a range of motion at the end of the case under fluoroscopic imaging to visualize the approach-withdraw phenomenon and confirm implant length in the head. The patient was then awakened from anesthesia and taken to the recovery room in stable condition. All counts were correct at the end of the case.   POSTOPERATIVE PLAN: The patient will be weight bearing as tolerated and will return in 2 weeks for staple removal and the patient will receive DVT prophylaxis based on other medications, activity level, and risk ratio of bleeding to thrombosis.   Tara Reel, MD [facility] [phone] 5:03 PM

## 2015-02-06 NOTE — Anesthesia Preprocedure Evaluation (Addendum)
Anesthesia Evaluation  Patient identified by MRN, date of birth, ID band Patient awake    Reviewed: Allergy & Precautions, NPO status , Patient's Chart, lab work & pertinent test results  Airway Mallampati: II  TM Distance: >3 FB Neck ROM: Full    Dental  (+) Edentulous Lower, Edentulous Upper   Pulmonary asthma , Current Smoker,  breath sounds clear to auscultation        Cardiovascular hypertension, + CAD Rhythm:Regular Rate:Normal     Neuro/Psych    GI/Hepatic negative GI ROS, Neg liver ROS,   Endo/Other  diabetes, Type 2, Insulin Dependent  Renal/GU negative Renal ROS     Musculoskeletal   Abdominal   Peds  Hematology   Anesthesia Other Findings   Reproductive/Obstetrics                            Anesthesia Physical Anesthesia Plan  ASA: III  Anesthesia Plan: General   Post-op Pain Management:    Induction: Intravenous  Airway Management Planned: Oral ETT  Additional Equipment:   Intra-op Plan:   Post-operative Plan: Extubation in OR  Informed Consent: I have reviewed the patients History and Physical, chart, labs and discussed the procedure including the risks, benefits and alternatives for the proposed anesthesia with the patient or authorized representative who has indicated his/her understanding and acceptance.   Dental advisory given  Plan Discussed with: Anesthesiologist, Surgeon and CRNA  Anesthesia Plan Comments:        Anesthesia Quick Evaluation

## 2015-02-06 NOTE — Progress Notes (Signed)
Orthopedic Tech Progress Note Patient Details:  Tara Flores 03-29-1936 [id number] Pt. is unable to use OHF with trapeze.     [name] 02/06/2015, 7:06 PM

## 2015-02-06 NOTE — Discharge Instructions (Signed)
° ° °  1. Change dressings as needed °2. May shower but keep incisions covered and dry °3. Take lovenox to prevent blood clots °4. Take stool softeners as needed °5. Take pain meds as needed ° °

## 2015-02-07 ENCOUNTER — Encounter (HOSPITAL_COMMUNITY): Payer: Self-pay | Admitting: Orthopaedic Surgery

## 2015-02-07 DIAGNOSIS — I10 Essential (primary) hypertension: Secondary | ICD-10-CM

## 2015-02-07 DIAGNOSIS — Z720 Tobacco use: Secondary | ICD-10-CM

## 2015-02-07 DIAGNOSIS — S72001G Fracture of unspecified part of neck of right femur, subsequent encounter for closed fracture with delayed healing: Secondary | ICD-10-CM

## 2015-02-07 LAB — GLUCOSE, CAPILLARY
GLUCOSE-CAPILLARY: 215 mg/dL — AB (ref 65–99)
Glucose-Capillary: 169 mg/dL — ABNORMAL HIGH (ref 65–99)
Glucose-Capillary: 237 mg/dL — ABNORMAL HIGH (ref 65–99)
Glucose-Capillary: 190 mg/dL — ABNORMAL HIGH (ref 65–99)
Glucose-Capillary: 296 mg/dL — ABNORMAL HIGH (ref 65–99)
Glucose-Capillary: 330 mg/dL — ABNORMAL HIGH (ref 65–99)

## 2015-02-07 LAB — BASIC METABOLIC PANEL
Anion gap: 7 (ref 5–15)
BUN: 11 mg/dL (ref 6–20)
CALCIUM: 8.6 mg/dL — AB (ref 8.9–10.3)
CHLORIDE: 101 mmol/L (ref 101–111)
CO2: 26 mmol/L (ref 22–32)
CREATININE: 0.62 mg/dL (ref 0.44–1.00)
Glucose, Bld: 153 mg/dL — ABNORMAL HIGH (ref 65–99)
Potassium: 3.7 mmol/L (ref 3.5–5.1)
SODIUM: 134 mmol/L — AB (ref 135–145)

## 2015-02-07 LAB — HEMOGLOBIN A1C
Hgb A1c MFr Bld: 8 % — ABNORMAL HIGH (ref 4.8–5.6)
MEAN PLASMA GLUCOSE: 183 mg/dL

## 2015-02-07 LAB — CBC
HCT: 32.7 % — ABNORMAL LOW (ref 36.0–46.0)
Hemoglobin: 11.3 g/dL — ABNORMAL LOW (ref 12.0–15.0)
MCH: 33.9 pg (ref 26.0–34.0)
MCHC: 34.6 g/dL (ref 30.0–36.0)
MCV: 98.2 fL (ref 78.0–100.0)
PLATELETS: 165 10*3/uL (ref 150–400)
RBC: 3.33 MIL/uL — AB (ref 3.87–5.11)
RDW: 12.4 % (ref 11.5–15.5)
WBC: 8.8 10*3/uL (ref 4.0–10.5)

## 2015-02-07 MED ORDER — INSULIN GLARGINE 100 UNIT/ML ~~LOC~~ SOLN
10.0000 [IU] | Freq: Every day | SUBCUTANEOUS | Status: DC
  Filled 2015-02-07: qty 0.1

## 2015-02-07 MED ORDER — ATORVASTATIN CALCIUM 10 MG PO TABS
20.0000 mg | ORAL_TABLET | Freq: Every day | ORAL | Status: DC
  Administered 2015-02-07: 20 mg via ORAL
  Filled 2015-02-07: qty 2

## 2015-02-07 MED ORDER — INSULIN ASPART 100 UNIT/ML ~~LOC~~ SOLN
0.0000 [IU] | Freq: Three times a day (TID) | SUBCUTANEOUS | Status: DC
  Administered 2015-02-07: 8 [IU] via SUBCUTANEOUS
  Administered 2015-02-08: 15 [IU] via SUBCUTANEOUS
  Administered 2015-02-08: 5 [IU] via SUBCUTANEOUS

## 2015-02-07 MED ORDER — INSULIN ASPART 100 UNIT/ML ~~LOC~~ SOLN
0.0000 [IU] | Freq: Every day | SUBCUTANEOUS | Status: DC
  Administered 2015-02-07: 4 [IU] via SUBCUTANEOUS

## 2015-02-07 NOTE — Progress Notes (Signed)
   Subjective:  Patient reports pain as mild.    Objective:   VITALS:   Filed Vitals:   02/06/15 1755 02/06/15 1859 02/06/15 1920 02/07/15 0600  BP: 153/69 133/53 133/53 145/68  Pulse: 82 72 72 74  Temp: 98.1 F (36.7 C) 98.1 F (36.7 C)  98.4 F (36.9 C)  TempSrc:  Oral  Oral  Resp: Height:      Weight:      SpO2: 81% 98%  99%    Neurologically intact Neurovascular intact Sensation intact distally Intact pulses distally Dorsiflexion/Plantar flexion intact Incision: dressing C/D/I and no drainage No cellulitis present Compartment soft   Lab Results  Component Value Date   WBC 15.7* 02/06/2015   HGB 12.2 02/06/2015   HCT 35.7* 02/06/2015   MCV 99.4 02/06/2015   PLT 175 02/06/2015     Assessment/Plan:  1 Day Post-Op   - Expected postop acute blood loss anemia - will monitor for symptoms - Up with PT/OT - DVT ppx - SCDs, ambulation, lovenox - WBAT operative extremity - Pain control - Discharge planning - likely needs SNF  Cheral Almas 02/07/2015, 7:36 AM [phone]

## 2015-02-07 NOTE — Progress Notes (Signed)
Utilization review completed. Mendy Chou, RN, BSN. 

## 2015-02-07 NOTE — Clinical Social Work Note (Signed)
Disposition: family requesting [facility] or [facility] SN[facility] (bed offer not yet in) Projected discharge: Friday 02/08/2015 Barriers to discharge: none identified  CSW spoke with daughter and daugher-in-law at bedside.  Family is requesting SNF.  Patient meets criteria.  PT evaluation pending.  Vickii Penna, LCSW [phone]  Psychiatric & Orthopedics (5N 1-8) Clinical Social Worker

## 2015-02-07 NOTE — Clinical Social Work Placement (Signed)
   CLINICAL SOCIAL WORK PLACEMENT  NOTE  Date:  02/07/2015  Patient Details  Name: NOHA MILBERGER MRN: [id number] Date of Birth: 06/08/1936  Clinical Social Work is seeking post-discharge placement for this patient at the Skilled  Nursing Facility level of care (*CSW will initial, date and re-position this form in  chart as items are completed):  Yes   Patient/family provided with [facility] Clinical Social Work Department's list of facilities offering this level of care within the geographic area requested by the patient (or if unable, by the patient's family).  Yes   Patient/family informed of their freedom to choose among providers that offer the needed level of care, that participate in Medicare, Medicaid or managed care program needed by the patient, have an available bed and are willing to accept the patient.  Yes   Patient/family informed of [facility]'s ownership interest in [facility] and [facility], as well as of the fact that they are under no obligation to receive care at these facilities.  PASRR submitted to EDS on 02/07/15     PASRR number received on 02/07/15     Existing PASRR number confirmed on       FL2 transmitted to all facilities in geographic area requested by pt/family on 02/07/15     FL2 transmitted to all facilities within larger geographic area on       Patient informed that his/her managed care company has contracts with or will negotiate with certain facilities, including the following:            Patient/family informed of bed offers received.  Patient chooses bed at       Physician recommends and patient chooses bed at      Patient to be transferred to   on  .  Patient to be transferred to facility by       Patient family notified on   of transfer.  Name of family member notified:        PHYSICIAN       Additional Comment:    _______________________________________________ Rondel Baton, LCSW 02/07/2015, 10:49 AM

## 2015-02-07 NOTE — Clinical Social Work Note (Signed)
Clinical Social Work Assessment  Patient Details  Name: Tara Flores MRN: [id number] Date of Birth: 31-Jul-1935  Date of referral:  02/07/15               Reason for consult:  Facility Placement                Permission sought to share information with:  Facility Medical sales representative, Family Supports Permission granted to share information::  Yes, Verbal Permission Granted  Name::      (family- sons, dughters and daughter-in-law)  Scientist, forensic::   (facilities in [place])  Relationship::     Contact Information:     Housing/Transportation Living arrangements for the past 2 months:  Single Family Home Source of Information:  Patient, Adult Children (daughter-in-law at bedside) Patient Interpreter Needed:  None Criminal Activity/Legal Involvement Pertinent to Current Situation/Hospitalization:  No - Comment as needed Significant Relationships:  Adult Children Lives with:  Self Do you feel safe going back to the place where you live?  No (high fall risk with no supervision available) Need for family participation in patient care:  Yes (Comment) (patient requests)  Care giving concerns:  Patient has no supervision at home- lives alone with family support- family works   Office manager / plan:  CSW completed assessment with patient and daughter-in-law at bedside.  Patient would like family to assist in decision making for placement.  Family appears very supportive.  Patient is from home alone and does not drive.  Patient is independent with ADLs at baseline.  CSW will contact patient's daughter to review bed offers once received, per patient's request.  Employment status:  Retired Database administrator PT Recommendations:  24 Hour Supervision, Skilled Nursing Facility Information / Referral to community resources:  Skilled Nursing Facility  Patient/Family's Response to care:  Patient and family are all agreeable to SNF  Patient/Family's Understanding  of and Emotional Response to Diagnosis, Current Treatment, and Prognosis:  Patient and family are all realistic regarding level of care needed at time of discharge and prognosis.  All are optimistic in regards to patient returning to independent level of living after completion of STR.  Emotional Assessment Appearance:  Appears older than stated age Attitude/Demeanor/Rapport:   (appropriate) Affect (typically observed):  Accepting, Adaptable, Appropriate Orientation:  Oriented to Self, Oriented to Place, Oriented to  Time, Oriented to Situation Alcohol / Substance use:  Not Applicable Psych involvement (Current and /or in the community):  No (Comment)  Discharge Needs  Concerns to be addressed:  No discharge needs identified Readmission within the last 30 days:    Current discharge risk:  None Barriers to Discharge:  No Barriers Identified   Rondel Baton, LCSW 02/07/2015, 10:47 AM

## 2015-02-07 NOTE — Anesthesia Postprocedure Evaluation (Signed)
  Anesthesia Post-op Note  Patient: Tara Flores  Procedure(s) Performed: Procedure(s): INTRAMEDULLARY (IM) NAIL RIGHT HIP (Right)  Patient Location: PACU  Anesthesia Type:General  Level of Consciousness: awake  Airway and Oxygen Therapy: Patient Spontanous Breathing  Post-op Pain: mild  Post-op Assessment: Post-op Vital signs reviewed LLE Motor Response: Purposeful movement, Responds to commands LLE Sensation: Full sensation RLE Motor Response: Purposeful movement, Responds to commands RLE Sensation: Decreased (r/t neuropathy)      Post-op Vital Signs: Reviewed  Last Vitals:  Filed Vitals:   02/07/15 0600  BP: 145/68  Pulse: 74  Temp: 36.9 C  Resp: 19    Complications: No apparent anesthesia complications

## 2015-02-07 NOTE — Progress Notes (Signed)
Subjective: No chest pain, no SOB waiting for PT to get her up, sleeps in a recliner at home   Objective: Vital signs in last 24 hours: Temp:  [98 F (36.7 C)-98.4 F (36.9 C)] 98.4 F (36.9 C) (08/18 0600) Pulse Rate:  [70-83] 74 (08/18 0600) Resp:  [19-25] 19 (08/18 0600) BP: (119-173)/(53-80) 145/68 mmHg (08/18 0600) SpO2:  [81 %-100 %] 99 % (08/18 0600) Weight:  [148 lb 1.6 oz (67.178 kg)] 148 lb 1.6 oz (67.178 kg) (08/17 1342) Weight change: 3 lb 1.6 oz (1.406 kg) Last BM Date: 02/06/15 Intake/Output from previous day: -1295 08/17 0701 - 08/18 0700 In: 1937.1 [P.O.:60; I.V.:1777.1; IV Piggyback:100] Out: 1955 [Urine:1855; Blood:100] Intake/Output this shift:    PE: General:Pleasant affect, NAD Skin:Warm and dry, brisk capillary refill HEENT:normocephalic, sclera clear, mucus membranes moist Neck:supple, no JVD Heart:S1S2 RRR without murmur, gallup, rub or click Lungs:clear, ant without rales, rhonchi, or wheezes ZOX:WRUE, non tender, + BS, do not palpate liver spleen or masses Ext:no lower ext edema, 2+ pedal pulses, 2+ radial pulses Neuro:alert and oriented X 3, MAE, follows commands, + facial symmetry Tele:  NONE   Lab Results:  Recent Labs  02/06/15 1948 02/07/15 0650  WBC 15.7* 8.8  HGB 12.2 11.3*  HCT 35.7* 32.7*  PLT 175 165   BMET  Recent Labs  02/06/15 0346 02/06/15 1948 02/07/15 0650  NA 133*  --  134*  K 3.7  --  3.7  CL 100*  --  101  CO2 24  --  26  GLUCOSE 225*  --  153*  BUN 12  --  11  CREATININE 0.64 0.75 0.62  CALCIUM 8.6*  --  8.6*   No results for input(s): TROPONINI in the last 72 hours.  Invalid input(s): CK, MB  No results found for: CHOL, HDL, LDLCALC, LDLDIRECT, TRIG, CHOLHDL Lab Results  Component Value Date   HGBA1C 8.0* 02/06/2015         Studies/Results: Dg Chest Port 1 View  02/05/2015   CLINICAL DATA:  Pre-operative evaluation for hip fracture.  EXAM: PORTABLE CHEST - 1 VIEW  COMPARISON:   08/07/2004  FINDINGS: Prominent central vascular markings with some peribronchial thickening. No focal airspace disease. Heart size is within normal limits. Slightly low lung volumes compared to the previous examination. The trachea is midline. Negative for a pneumothorax. Bony thorax is intact. There appears to be a 5 mm nodule at the left lung base which is likely stable from 2006.  IMPRESSION: Prominent central vascular structures with mild peribronchial thickening. Findings are suggestive for vascular congestion or mild edema.   Electronically Signed   By: Richarda Overlie M.D.   On: 02/05/2015 09:02   Dg C-arm 1-60 Min  02/06/2015   CLINICAL DATA:  Right hip fracture.  EXAM: DG HIP (WITH OR WITHOUT PELVIS) 2-3V RIGHT;  DG C-ARM 61-120 MIN  COMPARISON:  Radiographs dated 02/05/2015  FINDINGS: Images demonstrate the patient has undergone open reduction and internal fixation of the proximal femur fracture with an intramedullary nail and 2 screws across the femoral neck. The hardware appears in good position. Alignment of the fracture fragments appears essentially anatomic.  IMPRESSION: Open reduction and internal fixation of proximal right femur fracture.   Electronically Signed   By: Francene Boyers M.D.   On: 02/06/2015 17:28   Dg Hip Unilat With Pelvis 2-3 Views Right  02/06/2015   CLINICAL DATA:  Right hip fracture.  EXAM: DG  HIP (WITH OR WITHOUT PELVIS) 2-3V RIGHT;  DG C-ARM 61-120 MIN  COMPARISON:  Radiographs dated 02/05/2015  FINDINGS: Images demonstrate the patient has undergone open reduction and internal fixation of the proximal femur fracture with an intramedullary nail and 2 screws across the femoral neck. The hardware appears in good position. Alignment of the fracture fragments appears essentially anatomic.  IMPRESSION: Open reduction and internal fixation of proximal right femur fracture.   Electronically Signed   By: Francene Boyers M.D.   On: 02/06/2015 17:28    Medications: I have reviewed the  patient's current medications. Scheduled Meds: . aspirin  81 mg Oral Pre-Cath  . carvedilol  6.25 mg Oral BID WC  .  ceFAZolin (ANCEF) IV  2 g Intravenous Q6H  . docusate sodium  100 mg Oral BID  . enoxaparin (LOVENOX) injection  40 mg Subcutaneous Q24H  . folic acid  1 mg Oral Daily  . insulin aspart  0-15 Units Subcutaneous 6 times per day  . levothyroxine  137 mcg Oral QAC breakfast  . lisinopril  20 mg Oral Daily  . multivitamin with minerals  1 tablet Oral Daily  . nicotine  14 mg Transdermal Daily  . sodium chloride  3 mL Intravenous Q12H  . thiamine  100 mg Oral Daily   Or  . thiamine  100 mg Intravenous Daily   Continuous Infusions: . sodium chloride 50 mL/hr at 02/06/15 0500  . sodium chloride 125 mL/hr at 02/07/15 0505  . sodium chloride    . lactated ringers 10 mL/hr at 02/06/15 1440   PRN Meds:.sodium chloride, acetaminophen **OR** acetaminophen, albuterol, alum & mag hydroxide-simeth, HYDROcodone-acetaminophen, HYDROmorphone (DILAUDID) injection, LORazepam **OR** LORazepam, menthol-cetylpyridinium **OR** phenol, methocarbamol **OR** methocarbamol (ROBAXIN)  IV, metoCLOPramide **OR** metoCLOPramide (REGLAN) injection, morphine injection, ondansetron **OR** ondansetron (ZOFRAN) IV, oxyCODONE, polyethylene glycol, promethazine, sodium chloride  Assessment/Plan: 79 year old female with no known history of coronary disease but with multiple risk factors including hypertension, diabetes and tobacco abuse, admitted for hip fracture in the setting of mechanical fall, was cleared for surgery with Moderate CAD started on beta blocker. No active angina and normal flow wire down LAD Carotid study reviewed and no high grade stenosis . EF normal by Echo with no significant valve disease Plan for   Principal Problem:  Hip fracture- post op day 1 Treatment of intertrochanteric fracture with intramedullary implant Active Problems:  Closed right hip fracture  Abnormal EKG- see  below  Tobacco abuse  DM2 (diabetes mellitus, type 2)-followed by IM  Essential hypertension--stable up at times with pain  Hypothyroidism  Coronary artery disease involving native coronary artery of native heart without angina pectoris-- She underwent a left heart cath 02/05/15 revealing three vessel CAD with mild to moderate diffuse disease(30%) in the heavily calcified RCA and Circumflex system. Moderate heavily calcified stenosis mid LAD(60%). Fractional flow reserve 0.81 suggesting the stenosis is moderate and not flow limiting.  Normal LV systolic function. Medical management recommended. She is on beta blocker, ACE-I. EF 60-65%, G1DD.    Check lipids in AM add statin   LOS: 2 days   Time spent with pt. :15 minutes. [facility] R  Nurse Practitioner Certified Pager [phone] or after 5pm and on weekends call [phone] 02/07/2015, 8:45 AM   Doing well post op Less right hip pain.  No chest pain or arrhythmia.  Cath with moderate LAD disease Right carotid bruit with 1-39% bilateral disease ICA on duplex  Discussed smoking cessation again And she has patch on continue beta  blocker ASA.  BP good on ACE  Will arrange outpatient f/u with me In about 3 months to follow her vascular disease  Charlton Haws

## 2015-02-07 NOTE — Evaluation (Signed)
Physical Therapy Evaluation Patient Details Name: Tara Flores MRN: [id number] DOB: 29-Dec-1935 Today's Date: 02/07/2015   History of Present Illness  Patient is a 79 y/o female s/p IM nail right femur after mechanical fall at home. EKG with abnormal findings so pt s/p cardiac cath in order for clearance for surgery. PMH includes diabetes, HTN, hypothyroidism, tobacco abuse and alcohol use on a daily basis.  Clinical Impression  Patient presents with pain and post surgical deficits RLE s/p surgery. Highly motivated to return to PLOF and return home to her dog. Lives alone and independent PTA. Tolerated transfers/ambulation with Min-Mod A. Not safe to return home alone. Would benefit from ST SNF to maximize independence and mobility prior to return home.     Follow Up Recommendations SNF    Equipment Recommendations  Rolling walker with 5" wheels    Recommendations for Other Services       Precautions / Restrictions Precautions Precautions: Fall Restrictions Weight Bearing Restrictions: Yes RLE Weight Bearing: Weight bearing as tolerated      Mobility  Bed Mobility Overal bed mobility: Needs Assistance Bed Mobility: Sit to Supine       Sit to supine: Mod assist   General bed mobility comments: Mod A to bring BLEs into bed. Cues for sequencing. HOB flat, no use of rails.   Transfers Overall transfer level: Needs assistance Equipment used: Rolling walker (2 wheeled) Transfers: Sit to/from Stand Sit to Stand: Mod assist         General transfer comment: Mod A to boost from chair with cues for hand placement/technique. Stood from [employer], from toilet x1.   Ambulation/Gait Ambulation/Gait assistance: Min assist Ambulation Distance (Feet): 18 Feet (x2 bouts) Assistive device: Rolling walker (2 wheeled) Gait Pattern/deviations: Step-to pattern;Decreased stride length;Decreased stance time - right;Decreased step length - left;Trunk flexed   Gait velocity  interpretation: <1.8 ft/sec, indicative of risk for recurrent falls General Gait Details: Pt with slow, unsteady gait. 1 LOB posteriorly requiring min A for stability/safety. 2 short standing rest breaks. VSS.  Stairs            Wheelchair Mobility    Modified Rankin (Stroke Patients Only)       Balance Overall balance assessment: Needs assistance Sitting-balance support: Feet supported;Single extremity supported Sitting balance-Leahy Scale: Fair Sitting balance - Comments: ABle to perform pericare without difficulty in seated position. Some assist with finding toilet paper due to visual deficits.    Standing balance support: During functional activity Standing balance-Leahy Scale: Poor Standing balance comment: Relient on RW for support.                              Pertinent Vitals/Pain Pain Assessment: 0-10 Pain Score: 5  Pain Location: right hip with mobility. Pain Descriptors / Indicators: Sore Pain Intervention(s): Monitored during session;Repositioned    Home Living Family/patient expects to be discharged to:: Skilled nursing facility Living Arrangements: Alone Available Help at Discharge: Family;Available PRN/intermittently Type of Home: Mobile home Home Access: Stairs to enter Entrance Stairs-Rails:  (Rails present but grand daughter states they are not steady or functional.) Entrance Stairs-Number of Steps: 6   Home Equipment: Cane - single point      Prior Function Level of Independence: Independent         Comments: Pt independent with bathing, dressing, cooking, cleaning. Pt uses cane for community mobility. Pt legally blind and didn't drive PTA.      Hand  Dominance        Extremity/Trunk Assessment   Upper Extremity Assessment: Defer to OT evaluation           Lower Extremity Assessment: RLE deficits/detail RLE Deficits / Details: Limited AROM/strength secondary to pain and recent surgery. Able to perform LAQ with  difficulty.        Communication   Communication: No difficulties  Cognition Arousal/Alertness: Awake/alert Behavior During Therapy: WFL for tasks assessed/performed Overall Cognitive Status: Within Functional Limits for tasks assessed                      General Comments General comments (skin integrity, edema, etc.): Grand daughter present during session.    Exercises        Assessment/Plan    PT Assessment Patient needs continued PT services  PT Diagnosis Difficulty walking;Acute pain   PT Problem List Decreased strength;Pain;Decreased range of motion;Decreased activity tolerance;Decreased balance;Decreased mobility  PT Treatment Interventions Balance training;Gait training;Functional mobility training;Therapeutic activities;Therapeutic exercise;Patient/family education;DME instruction   PT Goals (Current goals can be found in the Care Plan section) Acute Rehab PT Goals Patient Stated Goal: to get home to my dog  PT Goal Formulation: With patient Time For Goal Achievement: 02/21/15 Potential to Achieve Goals: Good    Frequency Min 3X/week   Barriers to discharge Decreased caregiver support;Inaccessible home environment Pt lives alone and has 6 steps to get into mobile home.    Co-evaluation PT/OT/SLP Co-Evaluation/Treatment: Yes Reason for Co-Treatment: For patient/therapist safety PT goals addressed during session: Strengthening/ROM;Mobility/safety with mobility;Proper use of DME         End of Session Equipment Utilized During Treatment: Gait belt Activity Tolerance: Patient tolerated treatment well Patient left: in bed;with call bell/phone within reach;with bed alarm set;with family/visitor present Nurse Communication: Mobility status         Time: 1352-1415 PT Time Calculation (min) (ACUTE ONLY): 23 min   Charges:   PT Evaluation $Initial PT Evaluation Tier I: 1 Procedure     PT G Codes:        Deane Wattenbarger A Chella Chapdelaine 02/07/2015, 2:35  PM Mylo Red, PT, DPT [phone]

## 2015-02-07 NOTE — Progress Notes (Addendum)
PROGRESS NOTE  Tara Flores ZOX:[id number] DOB: 09-28-1935 DOA: 02/05/2015 PCP: No primary care provider on file.  Brief history 79 year old female with a history of diabetes mellitus, hypertension, hypothyroidism, continued tobacco abuse, alcohol dependence resented with mechanical fall. Workup revealed right femoral neck fracture. Orthopedics was consulted. The patient had an abnormal EKG with inferior and lateral lead ST depression. Cardiology was consulted. The patient underwent cardiac catheterization 02/05/2015 which revealed triple-vessel coronary artery disease with mild to moderate diffuse disease(30%) in the heavily calcified RCA and Circumflex system. Moderate heavily calcified stenosis mid LAD(60%).  Assessment/Plan: Right femoral neck fracture -s/p intramedullary implant -appreciate Dr. Roda Shutters -PT/OT-->SNF -pain control Coronary artery disease involving native coronary artery of native heart without angina pectoris - cardiac catheterization 02/05/2015 which revealed triple-vessel coronary artery disease with mild to moderate diffuse disease(30%) in the heavily calcified RCA and Circumflex system. Moderate heavily calcified stenosis mid LAD(60%) -Medical management recommended -Continue aspirin, BB, and statin -02/05/2015 echocardiogram EF 60-65%, grade 1 diastolic dysfunction, n WMA -02/05/2015 carotid duplex--no hemodynamically significant stenosis Diabetes mellitus type 2 -pt takes Novolin N--20 units am, 5-6 units pm -novolog sliding scale for now while npo -add lantus 10 units q hs -Check A1C--8.0 Hypertension  -Continue lisinopril  -Wean off clonidine  -Continue carvedilol per cardiology  Tobacco abuse  -Patient has >60 pack year hx -stable on 2L -will need ambulatory pulseox prior to d/c Hypothyroidism  -Continue Synthroid  Alcohol dependence  -Watch for signs of withdraw  Family communication--son updated at bedside Disposition--SNF on  8/19      Procedures/Studies: Ct Head Wo Contrast  02/05/2015   CLINICAL DATA:  Fall on blood thinners. Head injury. Initial encounter.  EXAM: CT HEAD WITHOUT CONTRAST  TECHNIQUE: Contiguous axial images were obtained from the base of the skull through the vertex without intravenous contrast.  COMPARISON:  None.  FINDINGS: Skull and Sinuses:Lucency in the right low occipital bone is most consistent with a vascular channel. No acute fracture suspected.  Patchy mucosal thickening in the bilateral paranasal sinuses, greatest in the ethmoids. No sinus effusion.  Orbits: No traumatic finding.  Bilateral cataract resection.  Brain: No evidence of intracranial injury. No evidence of acute infarction, hemorrhage, hydrocephalus, or mass lesion/mass effect. Chronic small-vessel disease with ischemic gliosis throughout the periventricular white matter. Remote lacunar infarct present in the left centrum semiovale. Cortical atrophy, mild for age.  IMPRESSION: 1. No intracranial injury or fracture. 2. Cortical atrophy and moderate chronic small vessel disease. 3. Chronic sinusitis.   Electronically Signed   By: Marnee Spring M.D.   On: 02/05/2015 06:47   Dg Chest Port 1 View  02/05/2015   CLINICAL DATA:  Pre-operative evaluation for hip fracture.  EXAM: PORTABLE CHEST - 1 VIEW  COMPARISON:  08/07/2004  FINDINGS: Prominent central vascular markings with some peribronchial thickening. No focal airspace disease. Heart size is within normal limits. Slightly low lung volumes compared to the previous examination. The trachea is midline. Negative for a pneumothorax. Bony thorax is intact. There appears to be a 5 mm nodule at the left lung base which is likely stable from 2006.  IMPRESSION: Prominent central vascular structures with mild peribronchial thickening. Findings are suggestive for vascular congestion or mild edema.   Electronically Signed   By: Richarda Overlie M.D.   On: 02/05/2015 09:02   Dg Knee Complete 4 Views  Right  02/05/2015   CLINICAL DATA:  Larey Seat while picking up a pillow, landed  on RIGHT side. RIGHT knee pain and swelling.  EXAM: RIGHT KNEE - COMPLETE 4+ VIEW  COMPARISON:  None.  FINDINGS: Limited examination due to patient positioning. No acute fracture deformity. No dislocation. Mild suspected medial compartment osteoarthrosis. Moderate vascular calcifications. Soft tissue planes are nonsuspicious.  IMPRESSION: No acute fracture deformity or dislocation.  Osteopenia, decreasing sensitivity for acute nondisplaced fractures.   Electronically Signed   By: Awilda Metro M.D.   On: 02/05/2015 05:02   Dg C-arm 1-60 Min  02/06/2015   CLINICAL DATA:  Right hip fracture.  EXAM: DG HIP (WITH OR WITHOUT PELVIS) 2-3V RIGHT;  DG C-ARM 61-120 MIN  COMPARISON:  Radiographs dated 02/05/2015  FINDINGS: Images demonstrate the patient has undergone open reduction and internal fixation of the proximal femur fracture with an intramedullary nail and 2 screws across the femoral neck. The hardware appears in good position. Alignment of the fracture fragments appears essentially anatomic.  IMPRESSION: Open reduction and internal fixation of proximal right femur fracture.   Electronically Signed   By: Francene Boyers M.D.   On: 02/06/2015 17:28   Dg Hip Unilat With Pelvis 2-3 Views Right  02/06/2015   CLINICAL DATA:  Right hip fracture.  EXAM: DG HIP (WITH OR WITHOUT PELVIS) 2-3V RIGHT;  DG C-ARM 61-120 MIN  COMPARISON:  Radiographs dated 02/05/2015  FINDINGS: Images demonstrate the patient has undergone open reduction and internal fixation of the proximal femur fracture with an intramedullary nail and 2 screws across the femoral neck. The hardware appears in good position. Alignment of the fracture fragments appears essentially anatomic.  IMPRESSION: Open reduction and internal fixation of proximal right femur fracture.   Electronically Signed   By: Francene Boyers M.D.   On: 02/06/2015 17:28   Dg Hip Unilat  With Pelvis 2-3  Views Right  02/05/2015   CLINICAL DATA:  Larey Seat while picking up a pillow, landed on RIGHT side. RIGHT knee pain and swelling.  EXAM: DG HIP (WITH OR WITHOUT PELVIS) 2-3V RIGHT  COMPARISON:  None.  FINDINGS: Oblique nondisplaced RIGHT femur intertrochanteric fracture extending through the greater and lesser trochanter. Femoral heads are well formed and located. No dislocation. No destructive bony lesions. Small RIGHT hip effusion. Moderate aortoiliac vascular calcifications.  IMPRESSION: Nondisplaced acute RIGHT femur intertrochanteric fracture. No dislocation.   Electronically Signed   By: Awilda Metro M.D.   On: 02/05/2015 05:05         Subjective: Patient denies fevers, chills, headache, chest pain, dyspnea, nausea, vomiting, diarrhea, abdominal pain, dysuria, hematuria. Denies any dizziness, headache, hematochezia, melena.   Objective: Filed Vitals:   02/06/15 1755 02/06/15 1859 02/06/15 1920 02/07/15 0600  BP: 153/69 133/53 133/53 145/68  Pulse: 82 72 72 74  Temp: 98.1 F (36.7 C) 98.1 F (36.7 C)  98.4 F (36.9 C)  TempSrc:  Oral  Oral  Resp: 25 20  19   Height:      Weight:      SpO2: 81% 98%  99%    Intake/Output Summary (Last 24 hours) at 02/07/15 1526 Last data filed at 02/07/15 0800  Gross per 24 hour  Intake 2147.08 ml  Output   1555 ml  Net 592.08 ml   Weight change: 1.406 kg (3 lb 1.6 oz) Exam:   General:  Pt is alert, follows commands appropriately, not in acute distress  HEENT: No icterus, No thrush, No neck mass, [place]/AT  Cardiovascular: RRR, S1/S2, no rubs, no gallops  Respiratory: Bibasilar rales. No wheezing. Good air movement.  Abdomen: Soft/+BS, non tender, non distended, no guarding; no hepatosplenomegaly  Extremities: No edema, No lymphangitis, No petechiae, No rashes, no synovitis; no cyanosis or clubbing  Data Reviewed: Basic Metabolic Panel:  Recent Labs Lab 02/05/15 0538 02/06/15 0346 02/06/15 1948 02/07/15 0650  NA 134* 133*   --  134*  K 3.5 3.7  --  3.7  CL 99* 100*  --  101  CO2 24 24  --  26  GLUCOSE 284* 225*  --  153*  BUN 11 12  --  11  CREATININE 0.59 0.64 0.75 0.62  CALCIUM 8.7* 8.6*  --  8.6*   Liver Function Tests: No results for input(s): AST, ALT, ALKPHOS, BILITOT, PROT, ALBUMIN in the last 168 hours. No results for input(s): LIPASE, AMYLASE in the last 168 hours. No results for input(s): AMMONIA in the last 168 hours. CBC:  Recent Labs Lab 02/05/15 0538 02/06/15 0346 02/06/15 1948 02/07/15 0650  WBC 13.8* 8.8 15.7* 8.8  NEUTROABS 12.1*  --   --   --   HGB 13.1 12.5 12.2 11.3*  HCT 38.6 36.4 35.7* 32.7*  MCV 97.7 97.1 99.4 98.2  PLT 202 181 175 165   Cardiac Enzymes: No results for input(s): CKTOTAL, CKMB, CKMBINDEX, TROPONINI in the last 168 hours. BNP: Invalid input(s): POCBNP CBG:  Recent Labs Lab 02/06/15 2020 02/07/15 0004 02/07/15 0421 02/07/15 0905 02/07/15 1202  GLUCAP 234* 215* 169* 237* 190*    Recent Results (from the past 240 hour(s))  MRSA PCR Screening     Status: None   Collection Time: 02/06/15 12:40 PM  Result Value Ref Range Status   MRSA by PCR NEGATIVE NEGATIVE Final    Comment:        The GeneXpert MRSA Assay (FDA approved for NASAL specimens only), is one component of a comprehensive MRSA colonization surveillance program. It is not intended to diagnose MRSA infection nor to guide or monitor treatment for MRSA infections.      Scheduled Meds: . aspirin  81 mg Oral Pre-Cath  . atorvastatin  20 mg Oral q1800  . carvedilol  6.25 mg Oral BID WC  . docusate sodium  100 mg Oral BID  . enoxaparin (LOVENOX) injection  40 mg Subcutaneous Q24H  . folic acid  1 mg Oral Daily  . insulin aspart  0-15 Units Subcutaneous 6 times per day  . levothyroxine  137 mcg Oral QAC breakfast  . lisinopril  20 mg Oral Daily  . multivitamin with minerals  1 tablet Oral Daily  . nicotine  14 mg Transdermal Daily  . sodium chloride  3 mL Intravenous Q12H  .  thiamine  100 mg Oral Daily   Continuous Infusions: . sodium chloride 50 mL/hr at 02/06/15 0500  . sodium chloride 125 mL/hr at 02/07/15 0505  . sodium chloride    . lactated ringers 10 mL/hr at 02/06/15 1440     Maitland Lesiak, DO  [facility] Pager [phone]  If 7PM-7AM, please contact night-coverage [email or web address] Password [email or web address] 02/07/2015, 3:26 PM   LOS: 2 days

## 2015-02-07 NOTE — Evaluation (Signed)
Occupational Therapy Evaluation Patient Details Name: Tara Flores MRN: [id number] DOB: [birth year] Today's Date: 02/07/2015    History of Present Illness Patient is a 79 y/o female s/p IM nail right femur after mechanical fall at home. EKG with abnormal findings so pt s/p cardiac cath in order for clearance for surgery. PMH includes diabetes, HTN, hypothyroidism, tobacco abuse and alcohol use on a daily basis.   Clinical Impression   Plan is for patient to discharge > SNF. No acute OT needs identified, all needs can be met in SNF. Please send text page to OT services if any questions, concerns, or with new orders: [phone] OR call office at [phone]. Thank you for the order.      Follow Up Recommendations  SNF;Supervision/Assistance - 24 hour    Equipment Recommendations  Other (comment) (TBD next venut of care)    Recommendations for Other Services  None at this time    Precautions / Restrictions Precautions Precautions: Fall Restrictions Weight Bearing Restrictions: Yes RLE Weight Bearing: Weight bearing as tolerated    Mobility Bed Mobility Overal bed mobility: Needs Assistance Bed Mobility: Sit to Supine       Sit to supine: Mod assist   General bed mobility comments: Mod A to bring BLEs into bed. Cues for sequencing. HOB flat, no use of rails.   Transfers Overall transfer level: Needs assistance Equipment used: Rolling walker (2 wheeled) Transfers: Sit to/from Stand Sit to Stand: Mod assist         General transfer comment: Mod A to boost from chair with cues for hand placement/technique. Stood from [employer], from toilet x1.     Balance Overall balance assessment: Needs assistance Sitting-balance support: Feet supported;Single extremity supported Sitting balance-Leahy Scale: Fair Sitting balance - Comments: ABle to perform pericare without difficulty in seated position. Some assist with finding toilet paper due to visual deficits.    Standing  balance support: Bilateral upper extremity supported;During functional activity Standing balance-Leahy Scale: Poor Standing balance comment: Relient on RW for support.     ADL Overall ADL's : Needs assistance/impaired General ADL Comments: Patient overall mod assist with ADLs and functional mobility/transfers from various surfaces. Pt will benefit from ST SNF prior to d/c>home.     Pertinent Vitals/Pain Pain Assessment: 0-10 Pain Score: 5  Pain Location: right hip with mobility Pain Descriptors / Indicators: Sore Pain Intervention(s): Monitored during session;Repositioned   Extremity/Trunk Assessment Upper Extremity Assessment Upper Extremity Assessment: Generalized weakness   Lower Extremity Assessment Lower Extremity Assessment: Defer to PT evaluation RLE Deficits / Details: Limited AROM/strength secondary to pain and recent surgery. Able to perform LAQ with difficulty.  RLE Sensation:  [facility].)       Communication Communication Communication: No difficulties   Cognition Arousal/Alertness: Awake/alert Behavior During Therapy: WFL for tasks assessed/performed Overall Cognitive Status: Within Functional Limits for tasks assessed             Home Living Family/patient expects to be discharged to:: Skilled nursing facility Living Arrangements: Alone Available Help at Discharge: Family;Available PRN/intermittently Type of Home: Mobile home Home Access: Stairs to enter Entrance Stairs-Number of Steps: 6 Entrance Stairs-Rails:  (Rails present but grand daughter states they are not steady or functional.)       Bathroom Shower/Tub: Teacher, early years/pre: Standard     Home Equipment: Cane - single point   Prior Functioning/Environment Level of Independence: Independent        Comments: Pt independent with bathing, dressing,  cooking, cleaning. Pt uses cane for community mobility. Pt legally blind and didn't drive PTA.     OT Diagnosis: Generalized  weakness;Acute pain   OT Problem List:  n/a, no acute OT needs    OT Treatment/Interventions:   n/a, no acute OT needs   OT Goals(Current goals can be found in the care plan section) Acute Rehab OT Goals Patient Stated Goal: to get home to my dog  OT Goal Formulation: With patient  OT Frequency:   n/a, no acute OT needs    Barriers to D/C: Decreased caregiver support       Co-evaluation PT/OT/SLP Co-Evaluation/Treatment: Yes Reason for Co-Treatment: For patient/therapist safety PT goals addressed during session: Strengthening/ROM;Mobility/safety with mobility;Proper use of DME OT goals addressed during session: ADL's and self-care;Strengthening/ROM      End of Session Equipment Utilized During Treatment: Gait belt;Rolling walker  Activity Tolerance: Patient tolerated treatment well Patient left: in bed;with call bell/phone within reach;with bed alarm set;with family/visitor present   Time: 1353-1416 OT Time Calculation (min): 23 min Charges:  OT General Charges $OT Visit: 1 Procedure OT Evaluation $Initial OT Evaluation Tier I: 1 Procedure  Marcellina Jonsson , MS, OTR/L, CLT Pager: [phone]  02/07/2015, 3:18 PM

## 2015-02-08 LAB — BASIC METABOLIC PANEL
ANION GAP: 12 (ref 5–15)
BUN: 10 mg/dL (ref 6–20)
CALCIUM: 8.3 mg/dL — AB (ref 8.9–10.3)
CHLORIDE: 95 mmol/L — AB (ref 101–111)
CO2: 22 mmol/L (ref 22–32)
CREATININE: 0.77 mg/dL (ref 0.44–1.00)
GFR calc non Af Amer: >60 mL/min (ref >60)
Glucose, Bld: 343 mg/dL — ABNORMAL HIGH (ref 65–99)
Potassium: 3.8 mmol/L (ref 3.5–5.1)
SODIUM: 129 mmol/L — AB (ref 135–145)

## 2015-02-08 LAB — CBC
HCT: 28.4 % — ABNORMAL LOW (ref 36.0–46.0)
HEMOGLOBIN: 9.9 g/dL — AB (ref 12.0–15.0)
MCH: 33.4 pg (ref 26.0–34.0)
MCHC: 34.9 g/dL (ref 30.0–36.0)
MCV: 95.9 fL (ref 78.0–100.0)
Platelets: 168 10*3/uL (ref 150–400)
RBC: 2.96 MIL/uL — ABNORMAL LOW (ref 3.87–5.11)
RDW: 12.1 % (ref 11.5–15.5)
WBC: 8.6 10*3/uL (ref 4.0–10.5)

## 2015-02-08 LAB — GLUCOSE, CAPILLARY
GLUCOSE-CAPILLARY: 371 mg/dL — AB (ref 65–99)
GLUCOSE-CAPILLARY: 217 mg/dL — AB (ref 65–99)

## 2015-02-08 LAB — LIPID PANEL
CHOLESTEROL: 136 mg/dL (ref 0–200)
HDL: 52 mg/dL (ref >40)
LDL Cholesterol: 70 mg/dL (ref 0–99)
TRIGLYCERIDES: 72 mg/dL (ref <150)
Total CHOL/HDL Ratio: 2.6 RATIO
VLDL: 14 mg/dL (ref 0–40)

## 2015-02-08 MED ORDER — ATORVASTATIN CALCIUM 20 MG PO TABS: 20.0000 mg | ORAL_TABLET | Freq: Every day | ORAL | Status: DC

## 2015-02-08 MED ORDER — CARVEDILOL 6.25 MG PO TABS: 6.2500 mg | ORAL_TABLET | Freq: Two times a day (BID) | ORAL | Status: DC

## 2015-02-08 MED ORDER — ASPIRIN 81 MG PO CHEW: 81.0000 mg | CHEWABLE_TABLET | ORAL | Status: DC

## 2015-02-08 MED ORDER — INSULIN NPH (HUMAN) (ISOPHANE) 100 UNIT/ML ~~LOC~~ SUSP
20.0000 [IU] | Freq: Every day | SUBCUTANEOUS | Status: DC
Start: 2015-02-08 — End: 2015-02-08
  Filled 2015-02-08: qty 10

## 2015-02-08 NOTE — Discharge Planning (Signed)
Patient will discharge today per MD order. Patient will discharge to: [facility] RN to call report prior to transportation to: [phone] Transportation: [facility] to be scheduled for 12:30pm  CSW sent discharge summary to SNF for review.  Packet is complete.  RN, patient and family aware of discharge plans.  Vickii Penna, LCSWA [phone]  Psychiatric & Orthopedics (5N 1-16) Clinical Social Worker

## 2015-02-08 NOTE — Progress Notes (Signed)
Physical Therapy Treatment Patient Details Name: Tara Flores [id number] DOB: July 29, 1935 Today's Date: 02/08/2015    History of Present Illness Patient is a 79 y/o female s/p IM nail right femur after mechanical fall at home. EKG with abnormal findings so pt s/p cardiac cath in order for clearance for surgery. PMH includes diabetes, HTN, hypothyroidism, tobacco abuse and alcohol use on a daily basis.    PT Comments    Patient making some progress today with ambulation. Complains of increased pain in UEs due to putting weight through RW. Continue to recommend SNF for ongoing Physical Therapy.     Follow Up Recommendations  SNF     Equipment Recommendations  Rolling walker with 5" wheels    Recommendations for Other Services       Precautions / Restrictions Restrictions RLE Weight Bearing: Weight bearing as tolerated    Mobility  Bed Mobility               General bed mobility comments: Patient up in recliner before and after session  Transfers Overall transfer level: Needs assistance Equipment used: Rolling walker (2 wheeled) Transfers: Sit to/from Stand Sit to Stand: Min assist         General transfer comment: Min A to power up into standing. Cues for safe hand placement   Ambulation/Gait Ambulation/Gait assistance: Min assist Ambulation Distance (Feet): 60 Feet Assistive device: Rolling walker (2 wheeled) Gait Pattern/deviations: Step-to pattern;Decreased step length - left;Decreased stance time - right;Trunk flexed   Gait velocity interpretation: <1.8 ft/sec, indicative of risk for recurrent falls General Gait Details: Patient required several standing breaks with gait. Cues for upright posture and safe positioning of RW> Patient tends to increased weight in UEs on RW.    Stairs            Wheelchair Mobility    Modified Rankin (Stroke Patients Only)       Balance                                    Cognition  Arousal/Alertness: Awake/alert Behavior During Therapy: WFL for tasks assessed/performed Overall Cognitive Status: Within Functional Limits for tasks assessed                      Exercises General Exercises - Lower Extremity Long Arc Quad: AROM;Right;10 reps Heel Slides: AAROM;Right;10 reps Hip ABduction/ADduction: AAROM;Right;10 reps    General Comments        Pertinent Vitals/Pain Pain Score: 5  Pain Location: R hip Pain Descriptors / Indicators: Sore Pain Intervention(s): Monitored during session    Home Living                      Prior Function            PT Goals (current goals can now be found in the care plan section) Progress towards PT goals: Progressing toward goals    Frequency  Min 3X/week    PT Plan Current plan remains appropriate    Co-evaluation             End of Session   Activity Tolerance: Patient tolerated treatment well Patient left: in chair;with call bell/phone within reach;with family/visitor present     Time: 0911-0932 PT Time Calculation (min) (ACUTE ONLY): 21 min  Charges:  $Gait Training: 8-22 mins  G Codes:      Fredrich Birks 02/08/2015, 12:17 PM 02/08/2015 Fredrich Birks PTA [phone] pager [phone] office

## 2015-02-08 NOTE — Progress Notes (Signed)
   Subjective:  Patient ambulating in hall  Objective:   VITALS:   Filed Vitals:   02/07/15 1450 02/07/15 2008 02/08/15 0451 02/08/15 0807  BP: 138/72 142/64 143/72 143/72  Pulse: 82 72 76   Temp: 98.1 F (36.7 C) 98.4 F (36.9 C) 98.3 F (36.8 C)   TempSrc: Oral     Resp: Height:      Weight:      SpO2: 98% 100% 98%     Neurologically intact Neurovascular intact Sensation intact distally Intact pulses distally Dorsiflexion/Plantar flexion intact Incision: dressing C/D/I and no drainage No cellulitis present Compartment soft   Lab Results  Component Value Date   WBC 8.6 02/08/2015   HGB 9.9* 02/08/2015   HCT 28.4* 02/08/2015   MCV 95.9 02/08/2015   PLT 168 02/08/2015     Assessment/Plan:  2 Days Post-Op   - Expected postop acute blood loss anemia - will monitor for symptoms - Up with PT/OT - DVT ppx - SCDs, ambulation, lovenox - WBAT operative extremity - SNF pending  Cheral Almas 02/08/2015, 11:08 AM [phone]

## 2015-02-08 NOTE — Clinical Social Work Placement (Signed)
   CLINICAL SOCIAL WORK PLACEMENT  NOTE  Date:  02/08/2015  Patient Details  Name: Tara Flores MRN: [id number] Date of Birth: [birth year]  Clinical Social Work is seeking post-discharge placement for this patient at the Skilled  Nursing Facility level of care (*CSW will initial, date and re-position this form in  chart as items are completed):  Yes   Patient/family provided with [facility] Clinical Social Work Department's list of facilities offering this level of care within the geographic area requested by the patient (or if unable, by the patient's family).  Yes   Patient/family informed of their freedom to choose among providers that offer the needed level of care, that participate in Medicare, Medicaid or managed care program needed by the patient, have an available bed and are willing to accept the patient.  Yes   Patient/family informed of [facility]'s ownership interest in [facility] and [facility], as well as of the fact that they are under no obligation to receive care at these facilities.  PASRR submitted to EDS on 02/07/15     PASRR number received on 02/07/15     Existing PASRR number confirmed on       FL2 transmitted to all facilities in geographic area requested by pt/family on 02/07/15     FL2 transmitted to all facilities within larger geographic area on       Patient informed that his/her managed care company has contracts with or will negotiate with certain facilities, including the following:        Yes   Patient/family informed of bed offers received.  Patient chooses bed at [facility]     Physician recommends and patient chooses bed at      Patient to be transferred to [facility] on 02/08/15.  Patient to be transferred to facility by [facility]     Patient family notified on 02/08/15 of transfer.  Name of family member notified:  daughter Corrie Dandy     PHYSICIAN Please prepare priority discharge summary, including medications     Additional  Comment:    _______________________________________________ Rondel Baton, LCSW 02/08/2015, 10:56 AM

## 2015-02-08 NOTE — Discharge Summary (Signed)
Physician Discharge Summary  Tara Flores WJX:[id number] DOB: 10-15-1935 DOA: 02/05/2015  PCP: No primary care provider on file.  Admit date: 02/05/2015 Discharge date: 02/08/2015  Recommendations for Outpatient Follow-up:  1. Pt will need to follow up with PCP in 2 weeks post discharge 2. Please obtain BMP in one week  Discharge Diagnoses:  Right femoral neck fracture -s/p intramedullary implant 02/06/2015 -appreciate Dr. Cheral Almas -PT/OT-->SNF -pain control -Dr. Roda Shutters prescribed enoxaparin 40mg  [place] daily x 14 days after dc for DVT prophylaxis Coronary artery disease involving native coronary artery of native heart without angina pectoris - cardiac catheterization 02/05/2015 which revealed triple-vessel coronary artery disease with mild to moderate diffuse disease(30%) in the heavily calcified RCA and Circumflex system. Moderate heavily calcified stenosis mid LAD(60%) -Medical management recommended -Continue aspirin, carvedilol, and statin -02/05/2015 echocardiogram EF 60-65%, grade 1 diastolic dysfunction, n WMA -02/05/2015 carotid duplex--no hemodynamically significant stenosis -Follow-up with cardiology-Dr. Charlton Haws on 05/10/15 @ 11AM Diabetes mellitus type 2 -pt takes Novolin N--20 units am, 5-6 units pm at home -However, pt endorsed that she has only been taking it intermittenly rather than daily -she intermittenly refused insulin during her hospitalization -Check A1C--8.0-->poor compliance as outpt Hypertension  -Continue lisinopril  -Wean off clonidine--will not restart  -Continue carvedilol per cardiology  Tobacco abuse  -Patient has >60 pack year hx -stable on RA Hypothyroidism  -Continue Synthroid  Alcohol dependence  -Watch for signs of withdraw CODE STATUS--DO NOT RESUSCITATE  Family communication--son updated at bedside Disposition--SNF on 8/19  Discharge Condition: stable  Disposition: SNF Follow-up Information    Follow up with Cheral Almas, MD In 2 weeks.   Specialty:  Orthopedic Surgery   Why:  For suture removal, For wound re-check   Contact information:   [address] [place] [address] [phone]       Follow up with Charlton Haws, MD On 05/10/2015.   Specialty:  Cardiology   Why:  at 11:00 AM    Contact information:   [address] [place] [address] [phone]       Diet:carb modified Wt Readings from Last 3 Encounters:  02/06/15 67.178 kg (148 lb 1.6 oz)    History of present illness:  79 year old female with a history of diabetes mellitus, hypertension, hypothyroidism, continued tobacco abuse, alcohol dependence resented with mechanical fall. Workup revealed right femoral neck fracture. Orthopedics was consulted. The patient had an abnormal EKG with inferior and lateral lead ST depression. Cardiology was consulted. The patient underwent cardiac catheterization 02/05/2015 which revealed triple-vessel coronary artery disease with mild to moderate diffuse disease(30%) in the heavily calcified RCA and Circumflex system. Moderate heavily calcified stenosis mid LAD(60%). The patient was started on carvedilol and lisinopril. She was cleared for surgery.  Dr. Roda Shutters repaired her right hip fracture on 02/06/2015. Physical therapy recommended skilled nursing facility and the patient and family agreed. During the hospitalization, the patient would intermittently refused her insulin. She stated that at home she would only intermittently take her insulin confirming a degree of noncompliance. On the day of discharge, the patient was afebrile and hemodynamically stable. She was medically stable for discharge to skilled nursing facility.  Consultants: Cardiology Ortho--Dr. Donnelly Stager  Discharge Exam: Filed Vitals:   02/08/15 0807  BP: 143/72  Pulse:   Temp:   Resp:    Filed Vitals:   02/07/15 1450 02/07/15 2008 02/08/15 0451 02/08/15 0807  BP: 138/72 142/64 143/72 143/72    Pulse: 82 72 76  Temp: 98.1 F (36.7 C) 98.4 F (36.9 C) 98.3 F (36.8 C)   TempSrc: Oral     Resp: 18 18 18    Height:      Weight:      SpO2: 98% 100% 98%    General: A&O x 3, NAD, pleasant, cooperative Cardiovascular: RRR, no rub, no gallop, no S3 Respiratory: Clear to auscultation but diminished breath sounds. No wheezing.  Abdomen:soft, nontender, nondistended, positive bowel sounds Extremities: No edema, No lymphangitis, no petechiae  Discharge Instructions      Discharge Instructions    Diet - low sodium heart healthy    Complete by:  As directed      Increase activity slowly    Complete by:  As directed      Weight bearing as tolerated    Complete by:  As directed             Medication List    STOP taking these medications        aspirin 325 MG tablet  Replaced by:  aspirin 81 MG chewable tablet     cephALEXin 500 MG capsule  Commonly known as:  KEFLEX     cloNIDine 0.2 MG tablet  Commonly known as:  CATAPRES     CLONIDINE HCL PO      TAKE these medications        albuterol 108 (90 BASE) MCG/ACT inhaler  Commonly known as:  PROVENTIL HFA;VENTOLIN HFA  Inhale 1 puff into the lungs every 6 (six) hours as needed for wheezing or shortness of breath.     ALBUTEROL IN  Inhale into the lungs.     aspirin 81 MG chewable tablet  Chew 1 tablet (81 mg total) by mouth before cath procedure.     atorvastatin 20 MG tablet  Commonly known as:  LIPITOR  Take 1 tablet (20 mg total) by mouth daily at 6 PM.     carvedilol 6.25 MG tablet  Commonly known as:  COREG  Take 1 tablet (6.25 mg total) by mouth 2 (two) times daily with a meal.     enoxaparin 40 MG/0.4ML injection  Commonly known as:  LOVENOX  Inject 0.4 mLs (40 mg total) into the skin daily.     HYDROcodone-acetaminophen 7.5-325 MG per tablet  Commonly known as:  NORCO  Take 1-2 tablets by mouth every 6 (six) hours as needed for moderate pain.     insulin NPH Human 100 UNIT/ML injection   Commonly known as:  HUMULIN N,NOVOLIN N  Inject 5-20 Units into the skin 2 (two) times daily before a meal. Take 20 units in the morning 5-6 units taken in the evening     levothyroxine 137 MCG tablet  Commonly known as:  SYNTHROID, LEVOTHROID  Take 137 mcg by mouth daily before breakfast.     lisinopril 20 MG tablet  Commonly known as:  PRINIVIL,ZESTRIL  Take 20 mg by mouth daily.     MULTI-BETIC DIABETES Tabs  Take 1 tablet by mouth daily.     multivitamin capsule  Take 1 capsule by mouth daily.     nicotine polacrilex 4 MG gum  Commonly known as:  NICORETTE  Take 4 mg by mouth as needed for smoking cessation.     VITAMIN C PO  Take by mouth.     VITAMIN D PO  Take 2,000 Units by mouth daily.         The results of significant diagnostics from this hospitalization (including imaging, microbiology,  ancillary and laboratory) are listed below for reference.    Significant Diagnostic Studies: Ct Head Wo Contrast  02/05/2015   CLINICAL DATA:  Fall on blood thinners. Head injury. Initial encounter.  EXAM: CT HEAD WITHOUT CONTRAST  TECHNIQUE: Contiguous axial images were obtained from the base of the skull through the vertex without intravenous contrast.  COMPARISON:  None.  FINDINGS: Skull and Sinuses:Lucency in the right low occipital bone is most consistent with a vascular channel. No acute fracture suspected.  Patchy mucosal thickening in the bilateral paranasal sinuses, greatest in the ethmoids. No sinus effusion.  Orbits: No traumatic finding.  Bilateral cataract resection.  Brain: No evidence of intracranial injury. No evidence of acute infarction, hemorrhage, hydrocephalus, or mass lesion/mass effect. Chronic small-vessel disease with ischemic gliosis throughout the periventricular white matter. Remote lacunar infarct present in the left centrum semiovale. Cortical atrophy, mild for age.  IMPRESSION: 1. No intracranial injury or fracture. 2. Cortical atrophy and moderate  chronic small vessel disease. 3. Chronic sinusitis.   Electronically Signed   By: Marnee Spring M.D.   On: 02/05/2015 06:47   Dg Chest Port 1 View  02/05/2015   CLINICAL DATA:  Pre-operative evaluation for hip fracture.  EXAM: PORTABLE CHEST - 1 VIEW  COMPARISON:  08/07/2004  FINDINGS: Prominent central vascular markings with some peribronchial thickening. No focal airspace disease. Heart size is within normal limits. Slightly low lung volumes compared to the previous examination. The trachea is midline. Negative for a pneumothorax. Bony thorax is intact. There appears to be a 5 mm nodule at the left lung base which is likely stable from 2006.  IMPRESSION: Prominent central vascular structures with mild peribronchial thickening. Findings are suggestive for vascular congestion or mild edema.   Electronically Signed   By: Richarda Overlie M.D.   On: 02/05/2015 09:02   Dg Knee Complete 4 Views Right  02/05/2015   CLINICAL DATA:  Larey Seat while picking up a pillow, landed on RIGHT side. RIGHT knee pain and swelling.  EXAM: RIGHT KNEE - COMPLETE 4+ VIEW  COMPARISON:  None.  FINDINGS: Limited examination due to patient positioning. No acute fracture deformity. No dislocation. Mild suspected medial compartment osteoarthrosis. Moderate vascular calcifications. Soft tissue planes are nonsuspicious.  IMPRESSION: No acute fracture deformity or dislocation.  Osteopenia, decreasing sensitivity for acute nondisplaced fractures.   Electronically Signed   By: Awilda Metro M.D.   On: 02/05/2015 05:02   Dg C-arm 1-60 Min  02/06/2015   CLINICAL DATA:  Right hip fracture.  EXAM: DG HIP (WITH OR WITHOUT PELVIS) 2-3V RIGHT;  DG C-ARM 61-120 MIN  COMPARISON:  Radiographs dated 02/05/2015  FINDINGS: Images demonstrate the patient has undergone open reduction and internal fixation of the proximal femur fracture with an intramedullary nail and 2 screws across the femoral neck. The hardware appears in good position. Alignment of the  fracture fragments appears essentially anatomic.  IMPRESSION: Open reduction and internal fixation of proximal right femur fracture.   Electronically Signed   By: Francene Boyers M.D.   On: 02/06/2015 17:28   Dg Hip Unilat With Pelvis 2-3 Views Right  02/06/2015   CLINICAL DATA:  Right hip fracture.  EXAM: DG HIP (WITH OR WITHOUT PELVIS) 2-3V RIGHT;  DG C-ARM 61-120 MIN  COMPARISON:  Radiographs dated 02/05/2015  FINDINGS: Images demonstrate the patient has undergone open reduction and internal fixation of the proximal femur fracture with an intramedullary nail and 2 screws across the femoral neck. The hardware appears in good position.  Alignment of the fracture fragments appears essentially anatomic.  IMPRESSION: Open reduction and internal fixation of proximal right femur fracture.   Electronically Signed   By: Francene Boyers M.D.   On: 02/06/2015 17:28   Dg Hip Unilat  With Pelvis 2-3 Views Right  02/05/2015   CLINICAL DATA:  Larey Seat while picking up a pillow, landed on RIGHT side. RIGHT knee pain and swelling.  EXAM: DG HIP (WITH OR WITHOUT PELVIS) 2-3V RIGHT  COMPARISON:  None.  FINDINGS: Oblique nondisplaced RIGHT femur intertrochanteric fracture extending through the greater and lesser trochanter. Femoral heads are well formed and located. No dislocation. No destructive bony lesions. Small RIGHT hip effusion. Moderate aortoiliac vascular calcifications.  IMPRESSION: Nondisplaced acute RIGHT femur intertrochanteric fracture. No dislocation.   Electronically Signed   By: Awilda Metro M.D.   On: 02/05/2015 05:05     Microbiology: Recent Results (from the past 240 hour(s))  MRSA PCR Screening     Status: None   Collection Time: 02/06/15 12:40 PM  Result Value Ref Range Status   MRSA by PCR NEGATIVE NEGATIVE Final    Comment:        The GeneXpert MRSA Assay (FDA approved for NASAL specimens only), is one component of a comprehensive MRSA colonization surveillance program. It is not intended  to diagnose MRSA infection nor to guide or monitor treatment for MRSA infections.      Labs: Basic Metabolic Panel:  Recent Labs Lab 02/05/15 0538 02/06/15 0346 02/06/15 1948 02/07/15 0650 02/08/15 0418  NA 134* 133*  --  134* 129*  K 3.5 3.7  --  3.7 3.8  CL 99* 100*  --  101 95*  CO2 24 24  --  26 22  GLUCOSE 284* 225*  --  153* 343*  BUN 11 12  --  11 10  CREATININE 0.59 0.64 0.75 0.62 0.77  CALCIUM 8.7* 8.6*  --  8.6* 8.3*   Liver Function Tests: No results for input(s): AST, ALT, ALKPHOS, BILITOT, PROT, ALBUMIN in the last 168 hours. No results for input(s): LIPASE, AMYLASE in the last 168 hours. No results for input(s): AMMONIA in the last 168 hours. CBC:  Recent Labs Lab 02/05/15 0538 02/06/15 0346 02/06/15 1948 02/07/15 0650 02/08/15 0418  WBC 13.8* 8.8 15.7* 8.8 8.6  NEUTROABS 12.1*  --   --   --   --   HGB 13.1 12.5 12.2 11.3* 9.9*  HCT 38.6 36.4 35.7* 32.7* 28.4*  MCV 97.7 97.1 99.4 98.2 95.9  PLT 202 181 175 165 168   Cardiac Enzymes: No results for input(s): CKTOTAL, CKMB, CKMBINDEX, TROPONINI in the last 168 hours. BNP: Invalid input(s): POCBNP CBG:  Recent Labs Lab 02/07/15 0905 02/07/15 1202 02/07/15 1625 02/07/15 2006 02/08/15 0610  GLUCAP 237* 190* 296* 330* 371*    Time coordinating discharge:  Greater than 30 minutes  Signed:  Lezli Danek, DO [facility] Pager: [phone] 02/08/2015, 10:33 AM

## 2015-02-11 ENCOUNTER — Non-Acute Institutional Stay (SKILLED_NURSING_FACILITY): Payer: Medicare Other | Admitting: Adult Health

## 2015-02-11 ENCOUNTER — Encounter: Payer: Self-pay | Admitting: Adult Health

## 2015-02-11 DIAGNOSIS — E871 Hypo-osmolality and hyponatremia: Secondary | ICD-10-CM | POA: Diagnosis not present

## 2015-02-11 DIAGNOSIS — S72001K Fracture of unspecified part of neck of right femur, subsequent encounter for closed fracture with nonunion: Secondary | ICD-10-CM | POA: Diagnosis not present

## 2015-02-11 DIAGNOSIS — I2510 Atherosclerotic heart disease of native coronary artery without angina pectoris: Secondary | ICD-10-CM | POA: Diagnosis not present

## 2015-02-11 DIAGNOSIS — E119 Type 2 diabetes mellitus without complications: Secondary | ICD-10-CM

## 2015-02-11 DIAGNOSIS — Z720 Tobacco use: Secondary | ICD-10-CM | POA: Diagnosis not present

## 2015-02-11 DIAGNOSIS — I10 Essential (primary) hypertension: Secondary | ICD-10-CM | POA: Diagnosis not present

## 2015-02-11 DIAGNOSIS — E039 Hypothyroidism, unspecified: Secondary | ICD-10-CM

## 2015-02-11 DIAGNOSIS — F1020 Alcohol dependence, uncomplicated: Secondary | ICD-10-CM | POA: Diagnosis not present

## 2015-02-11 DIAGNOSIS — D62 Acute posthemorrhagic anemia: Secondary | ICD-10-CM

## 2015-02-11 NOTE — Progress Notes (Signed)
Patient ID: Tara Flores, female   DOB: 21-May-1936, 79 y.o.   MRN: [id number]    DATE:  02/11/2015 MRN:  [id number]  BIRTHDAY: [birth year]  Facility:  Nursing Home Location:  [facility]  Nursing Home Room Number: 601-P  LEVEL OF CARE:  SNF (31)  Contact Information    Name Relation Home Work Mobile   Senor,Jamaiya Daughter [phone]         Chief Complaint  Patient presents with  . Hospitalization Follow-up    Right hip fracture S/P intramedullary implant, CAD S/P cardiac catheterization, diabetes mellitus, hypertension, tobacco abuse, hypothyroidism, alcohol dependence and anemia    HISTORY OF PRESENT ILLNESS:  This is a 79 year old female who has been admitted to [facility] on 02/08/15 from [facility]. She has PMH of diabetes mellitus, hypertension, hypothyroidism, tobacco abuse and alcohol dependence. She fell at home and sustained a right femoral neck fracture. Orthopedic was consulted. She had an abnormal EKG with inferior and lead ST depression. Cardiology was consulted. She had cardiac catheterization on 8/16 which revealed triple-vessel coronary artery disease with mild to moderate diffuse disease (30%) in the heavily calcified RCA and circumflex system. Moderate heavily calcified stenosis mid LAD (60%). She was started on carvedilol and lisinopril. She had repair of right hip fracture on 8/17. She had been intermittently refusing insulin/noncompliant while in the hospital.  She has been admitted for a short-term rehabilitation.  PAST MEDICAL HISTORY:  Past Medical History  Diagnosis Date  . Hypertension   . Diabetes mellitus without complication   . Asthma     CURRENT MEDICATIONS: Reviewed  Patient's Medications  New Prescriptions   No medications on file  Previous Medications   ALBUTEROL (PROVENTIL HFA;VENTOLIN HFA) 108 (90 BASE) MCG/ACT INHALER    Inhale 1 puff into the lungs every 6 (six) hours as needed for wheezing or shortness of  breath.   ALBUTEROL IN    Inhale into the lungs.   ASCORBIC ACID (VITAMIN C PO)    Take by mouth.   ASPIRIN 81 MG CHEWABLE TABLET    Chew 1 tablet (81 mg total) by mouth before cath procedure.   ATORVASTATIN (LIPITOR) 20 MG TABLET    Take 1 tablet (20 mg total) by mouth daily at 6 PM.   CARVEDILOL (COREG) 6.25 MG TABLET    Take 1 tablet (6.25 mg total) by mouth 2 (two) times daily with a meal.   CHOLECALCIFEROL (VITAMIN D PO)    Take 2,000 Units by mouth daily.    ENOXAPARIN (LOVENOX) 40 MG/0.4ML INJECTION    Inject 0.4 mLs (40 mg total) into the skin daily.   HYDROCODONE-ACETAMINOPHEN (NORCO) 7.5-325 MG PER TABLET    Take 1-2 tablets by mouth every 6 (six) hours as needed for moderate pain.   INSULIN NPH HUMAN (HUMULIN N,NOVOLIN N) 100 UNIT/ML INJECTION    Inject 3-29 Units into the skin 2 (two) times daily before a meal. Novolin N 29 units SQ Q AM;   Novolin N 6 units SQ Q evening for CBG > 300; 5 units SQ Q evening if CBG 150-300; Novolin 3 units SQ Q evening if CBG <150   LEVOTHYROXINE (SYNTHROID, LEVOTHROID) 137 MCG TABLET    Take 137 mcg by mouth daily before breakfast.   LISINOPRIL (PRINIVIL,ZESTRIL) 20 MG TABLET    Take 20 mg by mouth daily.   MULTIPLE VITAMIN (MULTIVITAMIN) CAPSULE    Take 1 capsule by mouth daily.   MULTIPLE VITAMINS-MINERALS (MULTI-BETIC DIABETES) TABS  Take 1 tablet by mouth daily.   NICOTINE (NICODERM CQ - DOSED IN MG/24 HOURS) 14 MG/24HR PATCH    Place 14 mg onto the skin daily.  Modified Medications   No medications on file  Discontinued Medications   INSULIN NPH HUMAN (HUMULIN N,NOVOLIN N) 100 UNIT/ML INJECTION    Inject 5-20 Units into the skin 2 (two) times daily before a meal. Take 20 units in the morning 5-6 units taken in the evening   NICOTINE POLACRILEX (NICORETTE) 4 MG GUM    Take 4 mg by mouth as needed for smoking cessation.     No Known Allergies   REVIEW OF SYSTEMS:  GENERAL: no change in appetite, no fatigue, no weight changes, no fever,  chills or weakness EYES: Denies change in vision, dry eyes, eye pain, itching or discharge EARS: Denies change in hearing, ringing in ears, or earache NOSE: Denies nasal congestion or epistaxis MOUTH and THROAT: Denies oral discomfort, gingival pain or bleeding, pain from teeth or hoarseness   RESPIRATORY: no cough, SOB, DOE, wheezing, hemoptysis CARDIAC: no chest pain, edema or palpitations GI: no abdominal pain, diarrhea, constipation, heart burn, nausea or vomiting GU: Denies dysuria, frequency, hematuria, incontinence, or discharge PSYCHIATRIC: Denies feeling of depression or anxiety. No report of hallucinations, insomnia, paranoia, or agitation  PHYSICAL EXAMINATION  GENERAL APPEARANCE: Well nourished. In no acute distress. Normal body habitus SKIN:  Right hip surgical incision with staples and dry dressing, no erythema HEAD: Normal in size and contour. No evidence of trauma EYES: Lids open and close normally. No blepharitis, entropion or ectropion. PERRL. Conjunctivae are clear and sclerae are white. Lenses are without opacity EARS: Pinnae are normal. Patient hears normal voice tunes of the examiner MOUTH and THROAT: Lips are without lesions. Oral mucosa is moist and without lesions. Tongue is normal in shape, size, and color and without lesions NECK: supple, trachea midline, no neck masses, no thyroid tenderness, no thyromegaly LYMPHATICS: no LAN in the neck, no supraclavicular LAN RESPIRATORY: breathing is even & unlabored, BS CTAB CARDIAC: RRR, no murmur,no extra heart sounds, no edema GI: abdomen soft, normal BS, no masses, no tenderness, no hepatomegaly, no splenomegaly EXTREMITIES:  Able to move X 4 extremities PSYCHIATRIC: Alert and oriented X 3. Affect and behavior are appropriate  LABS/RADIOLOGY: Labs reviewed: Basic Metabolic Panel:  Recent Labs  29/56/21 0346 02/06/15 1948 02/07/15 0650 02/08/15 0418  NA 133*  --  134* 129*  K 3.7  --  3.7 3.8  CL 100*  --  101  95*  CO2 24  --  26 22  GLUCOSE 225*  --  153* 343*  BUN 12  --  11 10  CREATININE 0.64 0.75 0.62 0.77  CALCIUM 8.6*  --  8.6* 8.3*   CBC:  Recent Labs  02/05/15 0538  02/06/15 1948 02/07/15 0650 02/08/15 0418  WBC 13.8*  < > 15.7* 8.8 8.6  NEUTROABS 12.1*  --   --   --   --   HGB 13.1  < > 12.2 11.3* 9.9*  HCT 38.6  < > 35.7* 32.7* 28.4*  MCV 97.7  < > 99.4 98.2 95.9  PLT 202  < > 175 165 168  < > = values in this interval not displayed.  Lipid Panel:  Recent Labs  02/08/15 0418  HDL 52  CBG:  Recent Labs  02/07/15 2006 02/08/15 0610 02/08/15 1229  GLUCAP 330* 371* 217*     Ct Head Wo Contrast  02/05/2015  CLINICAL DATA:  Fall on blood thinners. Head injury. Initial encounter.  EXAM: CT HEAD WITHOUT CONTRAST  TECHNIQUE: Contiguous axial images were obtained from the base of the skull through the vertex without intravenous contrast.  COMPARISON:  None.  FINDINGS: Skull and Sinuses:Lucency in the right low occipital bone is most consistent with a vascular channel. No acute fracture suspected.  Patchy mucosal thickening in the bilateral paranasal sinuses, greatest in the ethmoids. No sinus effusion.  Orbits: No traumatic finding.  Bilateral cataract resection.  Brain: No evidence of intracranial injury. No evidence of acute infarction, hemorrhage, hydrocephalus, or mass lesion/mass effect. Chronic small-vessel disease with ischemic gliosis throughout the periventricular white matter. Remote lacunar infarct present in the left centrum semiovale. Cortical atrophy, mild for age.  IMPRESSION: 1. No intracranial injury or fracture. 2. Cortical atrophy and moderate chronic small vessel disease. 3. Chronic sinusitis.   Electronically Signed   By: Marnee Spring M.D.   On: 02/05/2015 06:47   Dg Chest Port 1 View  02/05/2015   CLINICAL DATA:  Pre-operative evaluation for hip fracture.  EXAM: PORTABLE CHEST - 1 VIEW  COMPARISON:  08/07/2004  FINDINGS: Prominent central vascular  markings with some peribronchial thickening. No focal airspace disease. Heart size is within normal limits. Slightly low lung volumes compared to the previous examination. The trachea is midline. Negative for a pneumothorax. Bony thorax is intact. There appears to be a 5 mm nodule at the left lung base which is likely stable from 2006.  IMPRESSION: Prominent central vascular structures with mild peribronchial thickening. Findings are suggestive for vascular congestion or mild edema.   Electronically Signed   By: Richarda Overlie M.D.   On: 02/05/2015 09:02   Dg Knee Complete 4 Views Right  02/05/2015   CLINICAL DATA:  Larey Seat while picking up a pillow, landed on RIGHT side. RIGHT knee pain and swelling.  EXAM: RIGHT KNEE - COMPLETE 4+ VIEW  COMPARISON:  None.  FINDINGS: Limited examination due to patient positioning. No acute fracture deformity. No dislocation. Mild suspected medial compartment osteoarthrosis. Moderate vascular calcifications. Soft tissue planes are nonsuspicious.  IMPRESSION: No acute fracture deformity or dislocation.  Osteopenia, decreasing sensitivity for acute nondisplaced fractures.   Electronically Signed   By: Awilda Metro M.D.   On: 02/05/2015 05:02   Dg C-arm 1-60 Min  02/06/2015   CLINICAL DATA:  Right hip fracture.  EXAM: DG HIP (WITH OR WITHOUT PELVIS) 2-3V RIGHT;  DG C-ARM 61-120 MIN  COMPARISON:  Radiographs dated 02/05/2015  FINDINGS: Images demonstrate the patient has undergone open reduction and internal fixation of the proximal femur fracture with an intramedullary nail and 2 screws across the femoral neck. The hardware appears in good position. Alignment of the fracture fragments appears essentially anatomic.  IMPRESSION: Open reduction and internal fixation of proximal right femur fracture.   Electronically Signed   By: Francene Boyers M.D.   On: 02/06/2015 17:28   Dg Hip Unilat With Pelvis 2-3 Views Right  02/06/2015   CLINICAL DATA:  Right hip fracture.  EXAM: DG HIP (WITH  OR WITHOUT PELVIS) 2-3V RIGHT;  DG C-ARM 61-120 MIN  COMPARISON:  Radiographs dated 02/05/2015  FINDINGS: Images demonstrate the patient has undergone open reduction and internal fixation of the proximal femur fracture with an intramedullary nail and 2 screws across the femoral neck. The hardware appears in good position. Alignment of the fracture fragments appears essentially anatomic.  IMPRESSION: Open reduction and internal fixation of proximal right femur fracture.  Electronically Signed   By: Francene Boyers M.D.   On: 02/06/2015 17:28   Dg Hip Unilat  With Pelvis 2-3 Views Right  02/05/2015   CLINICAL DATA:  Larey Seat while picking up a pillow, landed on RIGHT side. RIGHT knee pain and swelling.  EXAM: DG HIP (WITH OR WITHOUT PELVIS) 2-3V RIGHT  COMPARISON:  None.  FINDINGS: Oblique nondisplaced RIGHT femur intertrochanteric fracture extending through the greater and lesser trochanter. Femoral heads are well formed and located. No dislocation. No destructive bony lesions. Small RIGHT hip effusion. Moderate aortoiliac vascular calcifications.  IMPRESSION: Nondisplaced acute RIGHT femur intertrochanteric fracture. No dislocation.   Electronically Signed   By: Awilda Metro M.D.   On: 02/05/2015 05:05    ASSESSMENT/PLAN:  Right femoral neck fracture S/P intramedullary implant - for rehabilitation; continue Lovenox 40 mg subcutaneous daily 14 days for DVT prophylaxis; follow-up with Dr. , orthopedic surgeon; in 2 weeks; continue Norco 7.5/325 mg 1-2 tabs by mouth every 6 hours when necessary for pain  CAD S/P cardiac catheterization - continue aspirin 81 mg by mouth daily, carvedilol 6.25 mg 1 tab by mouth twice a day and atorvastatin 20 mg 1 tab by mouth every 6 p.m.; follow-up with Dr. Charlton Haws, cardiology, on 05/10/15  Diabetes mellitus, type II - hemoglobin A1c 8.0; start with home regimen of Novolin and 29 units subcutaneous every morning; Novolin and 3-5 units subcutaneous every  PM  Hypertension - continue lisinopril 20 mg 1 tab by mouth daily  Tobacco abuse - discontinue Nicorette, and start nicotine patch 14 mg 1 patch transdermal daily  Hypothyroidism - continue Synthroid 137 g by mouth daily; check TSH  Alcohol dependence - monitor off for agitation/anxiety  Anemia, acute blood loss - hemoglobin 9.9; check CBC  Hyponatremia - sodium 129; check BMP    Goals of care:  Short-term rehabilitation    [facility], NP [facility] [phone]

## 2015-02-15 ENCOUNTER — Non-Acute Institutional Stay (SKILLED_NURSING_FACILITY): Payer: Medicare Other | Admitting: Internal Medicine

## 2015-02-15 DIAGNOSIS — F4323 Adjustment disorder with mixed anxiety and depressed mood: Secondary | ICD-10-CM | POA: Diagnosis not present

## 2015-02-15 DIAGNOSIS — I10 Essential (primary) hypertension: Secondary | ICD-10-CM | POA: Diagnosis not present

## 2015-02-15 DIAGNOSIS — S72141S Displaced intertrochanteric fracture of right femur, sequela: Secondary | ICD-10-CM | POA: Diagnosis not present

## 2015-02-15 DIAGNOSIS — I2510 Atherosclerotic heart disease of native coronary artery without angina pectoris: Secondary | ICD-10-CM | POA: Diagnosis not present

## 2015-02-15 DIAGNOSIS — E119 Type 2 diabetes mellitus without complications: Secondary | ICD-10-CM | POA: Diagnosis not present

## 2015-02-15 DIAGNOSIS — E871 Hypo-osmolality and hyponatremia: Secondary | ICD-10-CM | POA: Diagnosis not present

## 2015-02-15 DIAGNOSIS — R2681 Unsteadiness on feet: Secondary | ICD-10-CM

## 2015-02-15 DIAGNOSIS — E039 Hypothyroidism, unspecified: Secondary | ICD-10-CM

## 2015-02-15 DIAGNOSIS — D62 Acute posthemorrhagic anemia: Secondary | ICD-10-CM

## 2015-02-18 NOTE — Progress Notes (Signed)
Patient ID: Tara Flores, female   DOB: December 12, 1935, 79 y.o.   MRN: [id number]      [facility]   PCP: No primary care provider on file.  Code Status: full code  No Known Allergies  Chief Complaint  Patient presents with  . New Admit To SNF     HPI:  79 y.o. patient is here for short term rehabilitation post hospital admission from 02/05/15-02/08/15 post fall with right femoral neck fracture. Orthopedic was consulted and preop EKG showed inferior lead ST depression. She underwent cardiac catheterization on 02/05/15 showing triple vessel CAD with mild to moderate diffuse disease (30%) in the heavily calcified RCA and circumflex system and moderate heavily calcified stenosis mid LAD (60%). She was started on carvedilol and lisinopril. She underwent intramedullary implant on 02/06/15. She has PMH of diabetes mellitus, hypertension, hypothyroidism, tobacco abuse and alcohol dependence. She is seen in her room today. She feels weak and mentions that she has concern of being hypoglycemic. cbg is 60. Given orange juice and crackers after which patient feels better. As per staff she had panic attack yesterday and responded well to ativan. Her pain is under control with current regimen.    Review of Systems:  Constitutional: Negative for fever, chills, diaphoresis.  HENT: Negative for headache, congestion, nasal discharge Eyes: Negative for eye pain, blurred vision, double vision and discharge.  Respiratory: Negative for cough, shortness of breath and wheezing.   Cardiovascular: Negative for chest pain, palpitations, leg swelling.  Gastrointestinal: Negative for heartburn, nausea, vomiting, abdominal pain Genitourinary: Negative for dysuria, flank pain.  Musculoskeletal: Negative for back pain, falls Skin: Negative for itching, rash.  Neurological: Negative for dizziness, tingling, focal weakness Psychiatric/Behavioral: Negative for depression.    Past Medical  History  Diagnosis Date  . Hypertension   . Diabetes mellitus without complication   . Asthma    Past Surgical History  Procedure Laterality Date  . Cardiac catheterization N/A 02/05/2015    Procedure: Left Heart Cath and Coronary Angiography;  Surgeon: Kathleene Hazel, MD;  Location: [facility];  Service: Cardiovascular;  Laterality: N/A;  . Intramedullary (im) nail intertrochanteric Right 02/06/2015    Procedure: INTRAMEDULLARY (IM) NAIL RIGHT HIP;  Surgeon: Tarry Kos, MD;  Location: [facility] OR;  Service: Orthopedics;  Laterality: Right;   Social History:   reports that she has been smoking.  She does not have any smokeless tobacco history on file. She reports that she drinks alcohol. She reports that she does not use illicit drugs.  Family History  Problem Relation Age of Onset  . Diabetes Brother     Medications:   Medication List       This list is accurate as of: 02/15/15 11:59 PM.  Always use your most recent med list.               albuterol 108 (90 BASE) MCG/ACT inhaler  Commonly known as:  PROVENTIL HFA;VENTOLIN HFA  Inhale 1 puff into the lungs every 6 (six) hours as needed for wheezing or shortness of breath.     ALBUTEROL IN  Inhale into the lungs.     aspirin 81 MG chewable tablet  Chew 1 tablet (81 mg total) by mouth before cath procedure.     atorvastatin 20 MG tablet  Commonly known as:  LIPITOR  Take 1 tablet (20 mg total) by mouth daily at 6 PM.     carvedilol 6.25 MG tablet  Commonly known  as:  COREG  Take 1 tablet (6.25 mg total) by mouth 2 (two) times daily with a meal.     enoxaparin 40 MG/0.4ML injection  Commonly known as:  LOVENOX  Inject 0.4 mLs (40 mg total) into the skin daily.     HYDROcodone-acetaminophen 7.5-325 MG per tablet  Commonly known as:  NORCO  Take 1-2 tablets by mouth every 6 (six) hours as needed for moderate pain.     insulin NPH Human 100 UNIT/ML injection  Commonly known as:  HUMULIN N,NOVOLIN N    Inject 3-29 Units into the skin 2 (two) times daily before a meal. Novolin N 29 units SQ Q AM;   Novolin N 6 units SQ Q evening for CBG > 300; 5 units SQ Q evening if CBG 150-300; Novolin 3 units SQ Q evening if CBG <150     levothyroxine 137 MCG tablet  Commonly known as:  SYNTHROID, LEVOTHROID  Take 137 mcg by mouth daily before breakfast.     lisinopril 20 MG tablet  Commonly known as:  PRINIVIL,ZESTRIL  Take 20 mg by mouth daily.     MULTI-BETIC DIABETES Tabs  Take 1 tablet by mouth daily.     multivitamin capsule  Take 1 capsule by mouth daily.     nicotine 14 mg/24hr patch  Commonly known as:  NICODERM CQ - dosed in mg/24 hours  Place 14 mg onto the skin daily.     VITAMIN C PO  Take by mouth.     VITAMIN D PO  Take 2,000 Units by mouth daily.         Physical Exam: Filed Vitals:   02/15/15 2103  BP: 121/63  Pulse: 76  Temp: 97.5 F (36.4 C)  Resp: 18  Weight: 157 lb (71.215 kg)  SpO2: 95%    General- elderly female, in no acute distress Head- normocephalic, atraumatic Nose- normal nasal mucosa, no maxillary or frontal sinus tenderness, no nasal discharge Throat- moist mucus membrane Eyes- PERRLA, EOMI, no pallor, no icterus, no discharge, normal conjunctiva, normal sclera Neck- no cervical lymphadenopathy Cardiovascular- normal s1,s2, no murmurs, palpable dorsalis pedis and radial pulses, 1+leg edema Respiratory- bilateral clear to auscultation, no wheeze, no rhonchi, no crackles, no use of accessory muscles Abdomen- bowel sounds present, soft, non tender Musculoskeletal- able to move all 4 extremities, right leg limited range of motion  Neurological- no focal deficit, alert and oriented to person, place and time Skin- warm and dry, right hip dry dressing, staples in place, bruise noted on the thigh Psychiatry- normal mood and affect    Labs reviewed: Basic Metabolic Panel:  Recent Labs  16/10/96 0346 02/06/15 1948 02/07/15 0650 02/08/15 0418   NA 133*  --  134* 129*  K 3.7  --  3.7 3.8  CL 100*  --  101 95*  CO2 24  --  26 22  GLUCOSE 225*  --  153* 343*  BUN 12  --  11 10  CREATININE 0.64 0.75 0.62 0.77  CALCIUM 8.6*  --  8.6* 8.3*   Liver Function Tests: No results for input(s): AST, ALT, ALKPHOS, BILITOT, PROT, ALBUMIN in the last 8760 hours. No results for input(s): LIPASE, AMYLASE in the last 8760 hours. No results for input(s): AMMONIA in the last 8760 hours. CBC:  Recent Labs  02/05/15 0538  02/06/15 1948 02/07/15 0650 02/08/15 0418  WBC 13.8*  < > 15.7* 8.8 8.6  NEUTROABS 12.1*  --   --   --   --  HGB 13.1  < > 12.2 11.3* 9.9*  HCT 38.6  < > 35.7* 32.7* 28.4*  MCV 97.7  < > 99.4 98.2 95.9  PLT 202  < > 175 165 168  < > = values in this interval not displayed. CBG:  Recent Labs  02/07/15 2006 02/08/15 0610 02/08/15 1229  GLUCAP 330* 371* 217*   02/12/15 wbc 6.4, hb 9.8, hct 28, plt 289, na 132, k 4.3, bun 12, cr 0.53, glu 286   Radiological Exams: Ct Head Wo Contrast  02/05/2015   CLINICAL DATA:  Fall on blood thinners. Head injury. Initial encounter.  EXAM: CT HEAD WITHOUT CONTRAST  TECHNIQUE: Contiguous axial images were obtained from the base of the skull through the vertex without intravenous contrast.  COMPARISON:  None.  FINDINGS: Skull and Sinuses:Lucency in the right low occipital bone is most consistent with a vascular channel. No acute fracture suspected.  Patchy mucosal thickening in the bilateral paranasal sinuses, greatest in the ethmoids. No sinus effusion.  Orbits: No traumatic finding.  Bilateral cataract resection.  Brain: No evidence of intracranial injury. No evidence of acute infarction, hemorrhage, hydrocephalus, or mass lesion/mass effect. Chronic small-vessel disease with ischemic gliosis throughout the periventricular white matter. Remote lacunar infarct present in the left centrum semiovale. Cortical atrophy, mild for age.  IMPRESSION: 1. No intracranial injury or fracture. 2.  Cortical atrophy and moderate chronic small vessel disease. 3. Chronic sinusitis.   Electronically Signed   By: Marnee Spring M.D.   On: 02/05/2015 06:47   Dg Chest Port 1 View  02/05/2015   CLINICAL DATA:  Pre-operative evaluation for hip fracture.  EXAM: PORTABLE CHEST - 1 VIEW  COMPARISON:  08/07/2004  FINDINGS: Prominent central vascular markings with some peribronchial thickening. No focal airspace disease. Heart size is within normal limits. Slightly low lung volumes compared to the previous examination. The trachea is midline. Negative for a pneumothorax. Bony thorax is intact. There appears to be a 5 mm nodule at the left lung base which is likely stable from 2006.  IMPRESSION: Prominent central vascular structures with mild peribronchial thickening. Findings are suggestive for vascular congestion or mild edema.   Electronically Signed   By: Richarda Overlie M.D.   On: 02/05/2015 09:02   Dg Knee Complete 4 Views Right  02/05/2015   CLINICAL DATA:  Larey Seat while picking up a pillow, landed on RIGHT side. RIGHT knee pain and swelling.  EXAM: RIGHT KNEE - COMPLETE 4+ VIEW  COMPARISON:  None.  FINDINGS: Limited examination due to patient positioning. No acute fracture deformity. No dislocation. Mild suspected medial compartment osteoarthrosis. Moderate vascular calcifications. Soft tissue planes are nonsuspicious.  IMPRESSION: No acute fracture deformity or dislocation.  Osteopenia, decreasing sensitivity for acute nondisplaced fractures.   Electronically Signed   By: Awilda Metro M.D.   On: 02/05/2015 05:02   Dg C-arm 1-60 Min  02/06/2015   CLINICAL DATA:  Right hip fracture.  EXAM: DG HIP (WITH OR WITHOUT PELVIS) 2-3V RIGHT;  DG C-ARM 61-120 MIN  COMPARISON:  Radiographs dated 02/05/2015  FINDINGS: Images demonstrate the patient has undergone open reduction and internal fixation of the proximal femur fracture with an intramedullary nail and 2 screws across the femoral neck. The hardware appears in good  position. Alignment of the fracture fragments appears essentially anatomic.  IMPRESSION: Open reduction and internal fixation of proximal right femur fracture.   Electronically Signed   By: Francene Boyers M.D.   On: 02/06/2015 17:28   Dg Hip  Unilat With Pelvis 2-3 Views Right  02/06/2015   CLINICAL DATA:  Right hip fracture.  EXAM: DG HIP (WITH OR WITHOUT PELVIS) 2-3V RIGHT;  DG C-ARM 61-120 MIN  COMPARISON:  Radiographs dated 02/05/2015  FINDINGS: Images demonstrate the patient has undergone open reduction and internal fixation of the proximal femur fracture with an intramedullary nail and 2 screws across the femoral neck. The hardware appears in good position. Alignment of the fracture fragments appears essentially anatomic.  IMPRESSION: Open reduction and internal fixation of proximal right femur fracture.   Electronically Signed   By: Francene Boyers M.D.   On: 02/06/2015 17:28   Dg Hip Unilat  With Pelvis 2-3 Views Right  02/05/2015   CLINICAL DATA:  Larey Seat while picking up a pillow, landed on RIGHT side. RIGHT knee pain and swelling.  EXAM: DG HIP (WITH OR WITHOUT PELVIS) 2-3V RIGHT  COMPARISON:  None.  FINDINGS: Oblique nondisplaced RIGHT femur intertrochanteric fracture extending through the greater and lesser trochanter. Femoral heads are well formed and located. No dislocation. No destructive bony lesions. Small RIGHT hip effusion. Moderate aortoiliac vascular calcifications.  IMPRESSION: Nondisplaced acute RIGHT femur intertrochanteric fracture. No dislocation.   Electronically Signed   By: Awilda Metro M.D.   On: 02/05/2015 05:05     Assessment/Plan  Unsteady gait S/p fall and right femoral fracture. S/p surgical repair. Will have patient work with PT/OT as tolerated to regain strength and restore function.  Fall precautions are in place.  Right femoral neck fracture  S/P intramedullary implant. Has f/u with orthopedics. Continue norco 7.5-325 mg 1-2 tab q6h prn pain and lovenox for dvt  prophylaxis. To work with therapy team.   CAD  S/P cardiac catheterization. continue aspirin 81 mg daily, carvedilol 6.25 mg bid, lisinopril 20 mg daily and atorvastatin 20 mg daily, has f/u with cardiology  Blood loss anemia Post op, stable Hb on recheck, monitor h&h  Hyponatremia Monitor bmp, improved  Diabetes mellitus type II hemoglobin A1c 8.0. Hypoglycemic episode today. Pt refuses any adjustment to her insulin for now. Monitor cbg. Continue Novolin current regimen  Hypertension  Stable bp, continue lisinopril 20 mg daily, carvedilol 6.25 mg bid, monitor bp  Hypothyroidism  continue Synthroid 137 mcg daily  Anxiety disorder Continue ativan 0.5 mg q6h prn anxiety   Goals of care: short term rehabilitation   Labs/tests ordered: cbc,cmp  Family/ staff Communication: reviewed care plan with patient and nursing supervisor    Oneal Grout, MD  [facility] [phone] (Monday-Friday 8 am - 5 pm) [phone] (afterhours)

## 2015-02-19 LAB — CBC AND DIFFERENTIAL
HCT: 32 % — AB (ref 36–46)
Hemoglobin: 10.5 g/dL — AB (ref 12.0–16.0)
PLATELETS: 457 10*3/uL — AB (ref 150–399)
WBC: 9.6 10*3/mL

## 2015-02-19 LAB — BASIC METABOLIC PANEL
BUN: 17 mg/dL (ref 4–21)
CREATININE: 0.8 mg/dL (ref 0.5–1.1)
GLUCOSE: 123 mg/dL
POTASSIUM: 3.9 mmol/L (ref 3.4–5.3)
Sodium: 133 mmol/L — AB (ref 137–147)

## 2015-02-19 LAB — HEPATIC FUNCTION PANEL
ALT: 28 U/L (ref 7–35)
AST: 34 U/L (ref 13–35)
Alkaline Phosphatase: 122 U/L (ref 25–125)
Bilirubin, Total: 0.6 mg/dL

## 2015-02-27 ENCOUNTER — Encounter: Payer: Self-pay | Admitting: Adult Health

## 2015-02-27 ENCOUNTER — Non-Acute Institutional Stay (SKILLED_NURSING_FACILITY): Payer: Medicare Other | Admitting: Adult Health

## 2015-02-27 DIAGNOSIS — S72141S Displaced intertrochanteric fracture of right femur, sequela: Secondary | ICD-10-CM

## 2015-02-27 DIAGNOSIS — I2510 Atherosclerotic heart disease of native coronary artery without angina pectoris: Secondary | ICD-10-CM | POA: Diagnosis not present

## 2015-02-27 DIAGNOSIS — F1020 Alcohol dependence, uncomplicated: Secondary | ICD-10-CM | POA: Diagnosis not present

## 2015-02-27 DIAGNOSIS — D62 Acute posthemorrhagic anemia: Secondary | ICD-10-CM | POA: Diagnosis not present

## 2015-02-27 DIAGNOSIS — I10 Essential (primary) hypertension: Secondary | ICD-10-CM

## 2015-02-27 DIAGNOSIS — E119 Type 2 diabetes mellitus without complications: Secondary | ICD-10-CM | POA: Diagnosis not present

## 2015-02-27 DIAGNOSIS — E871 Hypo-osmolality and hyponatremia: Secondary | ICD-10-CM | POA: Diagnosis not present

## 2015-02-27 DIAGNOSIS — E039 Hypothyroidism, unspecified: Secondary | ICD-10-CM

## 2015-02-27 DIAGNOSIS — Z720 Tobacco use: Secondary | ICD-10-CM

## 2015-02-27 NOTE — Progress Notes (Addendum)
Patient ID: Tara Flores, female   DOB: May 01, 1936, 79 y.o.   MRN: [id number]    DATE:  02/27/2015 MRN:  [id number]  BIRTHDAY: 04/28/1936  Facility:  Nursing Home Location:  [facility]  Nursing Home Room Number: 601-P  LEVEL OF CARE:  SNF [phone])  Contact Information    Name Relation Home Work [place] Daughter [phone]     [facility] & Marianne Sofia [phone]  [phone]   Alberson,Cathy Daughter   [phone]       Chief Complaint  Patient presents with  . Discharge Note    Right hip fracture S/P intramedullary implant, CAD S/P cardiac catheterization, diabetes mellitus, hypertension, tobacco abuse, hypothyroidism, alcohol dependence, hyponatremia and anemia    HISTORY OF PRESENT ILLNESS:  This is a 79 year old female who is for discharge home with home health PT for endurance, OT for ADLs and CNA for showers. DME:  Rolling walker, 3 in 1 bedside commode and extended tub bench. She has been admitted to [facility] on 02/08/15 from [facility]. She has PMH of diabetes mellitus, hypertension, hypothyroidism, tobacco abuse and alcohol dependence. She fell at home and sustained a right femoral neck fracture. Orthopedic was consulted. She had an abnormal EKG with inferior and lead ST depression. Cardiology was consulted. She had cardiac catheterization on 8/16 which revealed triple-vessel coronary artery disease with mild to moderate diffuse disease (30%) in the heavily calcified RCA and circumflex system. Moderate heavily calcified stenosis mid LAD (60%). She was started on carvedilol and lisinopril. She had repair of right hip fracture on 8/17. She had been intermittently refusing insulin/noncompliant while in the hospital.  Her Humulin N has been adjusted to home regimen.  Patient was admitted to this facility for short-term rehabilitation after the patient's recent hospitalization.  Patient has completed SNF rehabilitation and therapy has  cleared the patient for discharge.  PAST MEDICAL HISTORY:  Past Medical History  Diagnosis Date  . Hypertension   . Diabetes mellitus without complication   . Asthma     CURRENT MEDICATIONS: Reviewed  Patient's Medications  New Prescriptions   No medications on file  Previous Medications   ALBUTEROL (PROVENTIL HFA;VENTOLIN HFA) 108 (90 BASE) MCG/ACT INHALER    Inhale 1 puff into the lungs every 6 (six) hours as needed for wheezing or shortness of breath.   ASCORBIC ACID (VITAMIN C PO)    Take by mouth.   ATORVASTATIN (LIPITOR) 20 MG TABLET    Take 1 tablet (20 mg total) by mouth daily at 6 PM.   CARVEDILOL (COREG) 6.25 MG TABLET    Take 1 tablet (6.25 mg total) by mouth 2 (two) times daily with a meal.   CHOLECALCIFEROL (VITAMIN D PO)    Take 2,000 Units by mouth daily.    ENOXAPARIN (LOVENOX) 40 MG/0.4ML INJECTION    Inject 0.4 mLs (40 mg total) into the skin daily.   HYDROCODONE-ACETAMINOPHEN (NORCO) 7.5-325 MG PER TABLET    Take 1-2 tablets by mouth every 6 (six) hours as needed for moderate pain.   INSULIN NPH HUMAN (HUMULIN N,NOVOLIN N) 100 UNIT/ML INJECTION    Novolin N 29 units SQ Q AM; if FBS < 150 = 3 units, 150-300 = 5 units, > 300 = 6 units at bedtime   LEVOTHYROXINE (SYNTHROID, LEVOTHROID) 137 MCG TABLET    Take 137 mcg by mouth daily before breakfast.   LISINOPRIL (PRINIVIL,ZESTRIL) 20 MG TABLET    Take 20 mg by mouth daily.  LOPERAMIDE (IMODIUM A-D) 2 MG TABLET    2 mg. 2 by mouth initially followed by 1 after. DO NOT EXCEED 16 mg in 24 hours. If diarrhea persist more than 24 hours notify MD   LORAZEPAM (ATIVAN) 0.5 MG TABLET    Take 0.5 mg by mouth every 6 (six) hours as needed for anxiety.   MULTIPLE VITAMIN (MULTIVITAMIN) CAPSULE    Take 1 capsule by mouth daily.   MULTIPLE VITAMINS-MINERALS (MULTI-BETIC DIABETES) TABS    Take 1 tablet by mouth daily.  Modified Medications   No medications on file  Discontinued Medications   NICOTINE (NICODERM CQ - DOSED IN MG/24  HOURS) 14 MG/24HR PATCH    Place 14 mg onto the skin daily.     No Known Allergies   REVIEW OF SYSTEMS:  GENERAL: no change in appetite, no fatigue, no weight changes, no fever, chills or weakness EYES: Denies change in vision, dry eyes, eye pain, itching or discharge EARS: Denies change in hearing, ringing in ears, or earache NOSE: Denies nasal congestion or epistaxis MOUTH and THROAT: Denies oral discomfort, gingival pain or bleeding, pain from teeth or hoarseness   RESPIRATORY: no cough, SOB, DOE, wheezing, hemoptysis CARDIAC: no chest pain, edema or palpitations GI: no abdominal pain, diarrhea, constipation, heart burn, nausea or vomiting GU: Denies dysuria, frequency, hematuria, incontinence, or discharge PSYCHIATRIC: Denies feeling of depression or anxiety. No report of hallucinations, insomnia, paranoia, or agitation  PHYSICAL EXAMINATION  GENERAL APPEARANCE: Well nourished. In no acute distress. Normal body habitus SKIN:  Right hip surgical incision is healed HEAD: Normal in size and contour. No evidence of trauma EYES: Lids open and close normally. No blepharitis, entropion or ectropion. PERRL. Conjunctivae are clear and sclerae are white. Lenses are without opacity EARS: Pinnae are normal. Patient hears normal voice tunes of the examiner MOUTH and THROAT: Lips are without lesions. Oral mucosa is moist and without lesions. Tongue is normal in shape, size, and color and without lesions NECK: supple, trachea midline, no neck masses, no thyroid tenderness, no thyromegaly LYMPHATICS: no LAN in the neck, no supraclavicular LAN RESPIRATORY: breathing is even & unlabored, BS CTAB CARDIAC: RRR, no murmur,no extra heart sounds, no edema GI: abdomen soft, normal BS, no masses, no tenderness, no hepatomegaly, no splenomegaly EXTREMITIES:  Able to move X 4 extremities PSYCHIATRIC: Alert and oriented X 3. Affect and behavior are appropriate  LABS/RADIOLOGY: Labs reviewed:  Basic  Metabolic Panel:  Recent Labs  16/10/96 0346  02/07/15 0650 02/08/15 0418 02/19/15  NA 133*  --  134* 129* 133*  K 3.7  --  3.7 3.8 3.9  CL 100*  --  101 95*  --   CO2 24  --  26 22  --   GLUCOSE 225*  --  153* 343*  --   BUN 12  --  11 10 17   CREATININE 0.64  < > 0.62 0.77 0.8  CALCIUM 8.6*  --  8.6* 8.3*  --   < > = values in this interval not displayed. CBC:  Recent Labs  02/05/15 0538  02/06/15 1948 02/07/15 0650 02/08/15 0418 02/19/15  WBC 13.8*  < > 15.7* 8.8 8.6 9.6  NEUTROABS 12.1*  --   --   --   --   --   HGB 13.1  < > 12.2 11.3* 9.9* 10.5*  HCT 38.6  < > 35.7* 32.7* 28.4* 32*  MCV 97.7  < > 99.4 98.2 95.9  --   PLT 202  < >  175 165 168 457*  < > = values in this interval not displayed.  Lipid Panel:  Recent Labs  02/08/15 0418  HDL 52  CBG:  Recent Labs  02/07/15 2006 02/08/15 0610 02/08/15 1229  GLUCAP 330* 371* 217*     Ct Head Wo Contrast  02/05/2015   CLINICAL DATA:  Fall on blood thinners. Head injury. Initial encounter.  EXAM: CT HEAD WITHOUT CONTRAST  TECHNIQUE: Contiguous axial images were obtained from the base of the skull through the vertex without intravenous contrast.  COMPARISON:  None.  FINDINGS: Skull and Sinuses:Lucency in the right low occipital bone is most consistent with a vascular channel. No acute fracture suspected.  Patchy mucosal thickening in the bilateral paranasal sinuses, greatest in the ethmoids. No sinus effusion.  Orbits: No traumatic finding.  Bilateral cataract resection.  Brain: No evidence of intracranial injury. No evidence of acute infarction, hemorrhage, hydrocephalus, or mass lesion/mass effect. Chronic small-vessel disease with ischemic gliosis throughout the periventricular white matter. Remote lacunar infarct present in the left centrum semiovale. Cortical atrophy, mild for age.  IMPRESSION: 1. No intracranial injury or fracture. 2. Cortical atrophy and moderate chronic small vessel disease. 3. Chronic sinusitis.    Electronically Signed   By: Marnee Spring M.D.   On: 02/05/2015 06:47   Dg Chest Port 1 View  02/05/2015   CLINICAL DATA:  Pre-operative evaluation for hip fracture.  EXAM: PORTABLE CHEST - 1 VIEW  COMPARISON:  08/07/2004  FINDINGS: Prominent central vascular markings with some peribronchial thickening. No focal airspace disease. Heart size is within normal limits. Slightly low lung volumes compared to the previous examination. The trachea is midline. Negative for a pneumothorax. Bony thorax is intact. There appears to be a 5 mm nodule at the left lung base which is likely stable from 2006.  IMPRESSION: Prominent central vascular structures with mild peribronchial thickening. Findings are suggestive for vascular congestion or mild edema.   Electronically Signed   By: Richarda Overlie M.D.   On: 02/05/2015 09:02   Dg Knee Complete 4 Views Right  02/05/2015   CLINICAL DATA:  Larey Seat while picking up a pillow, landed on RIGHT side. RIGHT knee pain and swelling.  EXAM: RIGHT KNEE - COMPLETE 4+ VIEW  COMPARISON:  None.  FINDINGS: Limited examination due to patient positioning. No acute fracture deformity. No dislocation. Mild suspected medial compartment osteoarthrosis. Moderate vascular calcifications. Soft tissue planes are nonsuspicious.  IMPRESSION: No acute fracture deformity or dislocation.  Osteopenia, decreasing sensitivity for acute nondisplaced fractures.   Electronically Signed   By: Awilda Metro M.D.   On: 02/05/2015 05:02   Dg C-arm 1-60 Min  02/06/2015   CLINICAL DATA:  Right hip fracture.  EXAM: DG HIP (WITH OR WITHOUT PELVIS) 2-3V RIGHT;  DG C-ARM 61-120 MIN  COMPARISON:  Radiographs dated 02/05/2015  FINDINGS: Images demonstrate the patient has undergone open reduction and internal fixation of the proximal femur fracture with an intramedullary nail and 2 screws across the femoral neck. The hardware appears in good position. Alignment of the fracture fragments appears essentially anatomic.   IMPRESSION: Open reduction and internal fixation of proximal right femur fracture.   Electronically Signed   By: Francene Boyers M.D.   On: 02/06/2015 17:28   Dg Hip Unilat With Pelvis 2-3 Views Right  02/06/2015   CLINICAL DATA:  Right hip fracture.  EXAM: DG HIP (WITH OR WITHOUT PELVIS) 2-3V RIGHT;  DG C-ARM 61-120 MIN  COMPARISON:  Radiographs dated 02/05/2015  FINDINGS:  Images demonstrate the patient has undergone open reduction and internal fixation of the proximal femur fracture with an intramedullary nail and 2 screws across the femoral neck. The hardware appears in good position. Alignment of the fracture fragments appears essentially anatomic.  IMPRESSION: Open reduction and internal fixation of proximal right femur fracture.   Electronically Signed   By: Francene Boyers M.D.   On: 02/06/2015 17:28   Dg Hip Unilat  With Pelvis 2-3 Views Right  02/05/2015   CLINICAL DATA:  Larey Seat while picking up a pillow, landed on RIGHT side. RIGHT knee pain and swelling.  EXAM: DG HIP (WITH OR WITHOUT PELVIS) 2-3V RIGHT  COMPARISON:  None.  FINDINGS: Oblique nondisplaced RIGHT femur intertrochanteric fracture extending through the greater and lesser trochanter. Femoral heads are well formed and located. No dislocation. No destructive bony lesions. Small RIGHT hip effusion. Moderate aortoiliac vascular calcifications.  IMPRESSION: Nondisplaced acute RIGHT femur intertrochanteric fracture. No dislocation.   Electronically Signed   By: Awilda Metro M.D.   On: 02/05/2015 05:05    ASSESSMENT/PLAN:  Right femoral neck fracture S/P intramedullary implant - for home health PT for endurance, OT for ADLs and CNA for showers; continue ; follow-up with Dr. , orthopedic surgeon; in 2 weeks; continue Norco 7.5/325 mg 1-2 tabs by mouth every 6 hours when necessary for pain  CAD S/P cardiac catheterization - continue aspirin 81 mg by mouth daily, carvedilol 6.25 mg 1 tab by mouth twice a day and atorvastatin 20 mg 1 tab by  mouth every 6 p.m.; follow-up with Dr. Charlton Haws, cardiology, on 05/10/15  Diabetes mellitus, type II - hemoglobin A1c 8.0; continue Novolin and 29 units subcutaneous every morning; Novolin and 3-5 units subcutaneous every HS  Hypertension - continue lisinopril 20 mg 1 tab by mouth daily  Tobacco abuse - discontinue nicotine patch ;Hypothyroidism - continue Synthroid 137 g by mouth daily; check TSH  Alcohol dependence - monitor off for agitation/anxiety; continue Ativan 0.5 mg 1 tab by mouth every 6 hours when necessary  Anemia, acute blood loss - hemoglobin 10.5;  Stable  Hyponatremia - sodium 133; improved    I have filled out patient's discharge paperwork and written prescriptions.  Patient will receive home health PT, OT and CNA.  DME provided:  Rolling walker, 3 in 1 bedside commode and extended tub bench   Total discharge time: Greater than 30 minutes  Discharge time involved coordination of the discharge process with Child psychotherapist, nursing staff and therapy department. Medical justification for home health services/DME verified.     [facility], NP [employer] [phone]

## 2015-03-07 ENCOUNTER — Encounter: Payer: Self-pay | Admitting: Nurse Practitioner

## 2015-03-07 ENCOUNTER — Ambulatory Visit: Payer: Medicare Other | Admitting: Nurse Practitioner

## 2015-03-07 ENCOUNTER — Ambulatory Visit (INDEPENDENT_AMBULATORY_CARE_PROVIDER_SITE_OTHER): Payer: Medicare Other | Admitting: Nurse Practitioner

## 2015-03-07 VITALS — BP 136/80 | HR 84 | Temp 97.6°F | Resp 20 | Ht 63.0 in | Wt 146.6 lb

## 2015-03-07 DIAGNOSIS — Z720 Tobacco use: Secondary | ICD-10-CM

## 2015-03-07 DIAGNOSIS — D649 Anemia, unspecified: Secondary | ICD-10-CM | POA: Diagnosis not present

## 2015-03-07 DIAGNOSIS — E039 Hypothyroidism, unspecified: Secondary | ICD-10-CM

## 2015-03-07 DIAGNOSIS — E119 Type 2 diabetes mellitus without complications: Secondary | ICD-10-CM | POA: Diagnosis not present

## 2015-03-07 DIAGNOSIS — E871 Hypo-osmolality and hyponatremia: Secondary | ICD-10-CM | POA: Diagnosis not present

## 2015-03-07 DIAGNOSIS — F411 Generalized anxiety disorder: Secondary | ICD-10-CM

## 2015-03-07 DIAGNOSIS — I2510 Atherosclerotic heart disease of native coronary artery without angina pectoris: Secondary | ICD-10-CM | POA: Diagnosis not present

## 2015-03-07 DIAGNOSIS — I10 Essential (primary) hypertension: Secondary | ICD-10-CM | POA: Diagnosis not present

## 2015-03-07 DIAGNOSIS — S72001D Fracture of unspecified part of neck of right femur, subsequent encounter for closed fracture with routine healing: Secondary | ICD-10-CM

## 2015-03-07 DIAGNOSIS — T148 Other injury of unspecified body region: Secondary | ICD-10-CM

## 2015-03-07 DIAGNOSIS — T148XXA Other injury of unspecified body region, initial encounter: Secondary | ICD-10-CM

## 2015-03-07 MED ORDER — HYDROCODONE-ACETAMINOPHEN 7.5-325 MG PO TABS: 1.0000 | ORAL_TABLET | Freq: Four times a day (QID) | ORAL | Status: DC | PRN

## 2015-03-07 MED ORDER — MUPIROCIN 2 % EX OINT: 1.0000 "application " | TOPICAL_OINTMENT | Freq: Two times a day (BID) | CUTANEOUS | Status: DC

## 2015-03-07 MED ORDER — SERTRALINE HCL 25 MG PO TABS: 25.0000 mg | ORAL_TABLET | Freq: Every day | ORAL | Status: DC

## 2015-03-07 NOTE — Patient Instructions (Addendum)
Decrease insulin NPH 25 units in the morning Only take 1 pain pill at a time  Cont to cut back on cigarettes.   Use antibiotic ointment to abrasion on leg, keep legs elevated when sitting and above the level of heart when sleeping-- notify if this worsens  Will get labs today Once labs back to start zoloft 25 mg by mouth daily    Follow up in 4 weeks for a physical with lab work prior to visit.

## 2015-03-07 NOTE — Progress Notes (Signed)
Patient ID: Tara Flores, female   DOB: [birth year], 79 y.o.   MRN: [id number]    PCP: Sharon Seller, NP  Advanced Directive information Does patient have an advance directive?: No, Would patient like information on creating an advanced directive?: Yes - Educational materials given  No Known Allergies  Chief Complaint  Patient presents with  . Establish Care    New patient Establish care  . Medical Management of Chronic Issues    Hypertensiion,DM  . OTHER     Discuss Advanced Directive      HPI: Patient is a 79 y.o. female seen in the office today to establish care. Pt was recently discharged from [facility] after fall at home and sustained a right femoral neck fracture s/p intramedullary implant on 02/06/15. Pt also underwent cardiac catheterization on 02/05/15 showing triple vessel CAD with mild to moderate diffuse disease (30%) in the heavily calcified RCA and circumflex system and moderate heavily calcified stenosis mid LAD (60%) and was started on carvedilol and lisinopril.  Does have prior PCP, was seeing Dr Lurene Shadow endocrine. -- last visit with Dr Lurene Shadow in August.  Has an episode the other night. Blood sugar was 78, ate a good dinner, and then took 2 pain pills. Went to get up and her legs gave out, like jello and had a fall. A lot of anxiety.  Blood sugars were in the 300s yesterday.  This morning blood sugars was 85.  Decreased appetite.  No longer drinking alcohol   Review of Systems:  Review of Systems  Constitutional: Positive for appetite change (slgihtly decreased appetite). Negative for fatigue and unexpected weight change.  HENT: Negative for congestion and hearing loss.   Eyes: Negative.   Respiratory: Negative for cough and shortness of breath.   Cardiovascular: Negative for chest pain, palpitations and leg swelling.  Gastrointestinal: Negative for abdominal pain, diarrhea and constipation.  Genitourinary: Negative for difficulty urinating.    Musculoskeletal: Positive for arthralgias (to right hip, improving). Negative for myalgias.  Skin: Negative for color change.       Small abrasion to left lower leg  Neurological: Positive for weakness (one episode). Negative for dizziness.  Psychiatric/Behavioral: Negative for behavioral problems, confusion and agitation. The patient is nervous/anxious.     Past Medical History  Diagnosis Date  . Hypertension   . Diabetes mellitus without complication   . Asthma   . Hypothyroidism   . Cardiac disease    Past Surgical History  Procedure Laterality Date  . Cardiac catheterization N/A 02/05/2015    Procedure: Left Heart Cath and Coronary Angiography;  Surgeon: Kathleene Hazel, MD;  Location: [facility] INVASIVE CV LAB;  Service: Cardiovascular;  Laterality: N/A;  . Intramedullary (im) nail intertrochanteric Right 02/06/2015    Procedure: INTRAMEDULLARY (IM) NAIL RIGHT HIP;  Surgeon: Tarry Kos, MD;  Location: [facility] OR;  Service: Orthopedics;  Laterality: Right;  . Hip fracture surgery     Social History:   reports that she has been smoking.  She has never used smokeless tobacco. She reports that she does not drink alcohol or use illicit drugs.  Family History  Problem Relation Age of Onset  . Diabetes Brother     Medications: Patient's Medications  New Prescriptions   No medications on file  Previous Medications   ALBUTEROL (PROVENTIL HFA;VENTOLIN HFA) 108 (90 BASE) MCG/ACT INHALER    Inhale 1 puff into the lungs every 6 (six) hours as needed for wheezing or shortness of breath.   ATORVASTATIN (  LIPITOR) 20 MG TABLET    Take 1 tablet (20 mg total) by mouth daily at 6 PM.   CARVEDILOL (COREG) 6.25 MG TABLET    Take 1 tablet (6.25 mg total) by mouth 2 (two) times daily with a meal.   HYDROCODONE-ACETAMINOPHEN (NORCO) 7.5-325 MG PER TABLET    Take 1-2 tablets by mouth every 6 (six) hours as needed for moderate pain.   INSULIN NPH HUMAN (HUMULIN N,NOVOLIN N) 100 UNIT/ML INJECTION     Novolin N 29 units SQ Q AM; if FBS < 150 = 3 units, 150-300 = 5 units, > 300 = 6 units at bedtime   LEVOTHYROXINE (SYNTHROID, LEVOTHROID) 137 MCG TABLET    Take 137 mcg by mouth daily before breakfast.   LISINOPRIL (PRINIVIL,ZESTRIL) 20 MG TABLET    Take 20 mg by mouth daily.   LORAZEPAM (ATIVAN) 0.5 MG TABLET    Take 0.5 mg by mouth every 6 (six) hours as needed for anxiety.  Modified Medications   No medications on file  Discontinued Medications   CHOLECALCIFEROL (VITAMIN D PO)    Take 2,000 Units by mouth daily.      Physical Exam:  Filed Vitals:   03/07/15 0906  Pulse: 84  Temp: 97.6 F (36.4 C)  TempSrc: Oral  Resp: 20  Height: 5\' 3"  (1.6 m)  Weight: 146 lb 9.6 oz (66.497 kg)  SpO2: 96%   Body mass index is 25.98 kg/(m^2).  Physical Exam  Constitutional: She is oriented to person, place, and time. She appears well-developed and well-nourished. No distress.  HENT:  Head: Normocephalic and atraumatic.  Mouth/Throat: Oropharynx is clear and moist. No oropharyngeal exudate.  Eyes: Conjunctivae are normal. Pupils are equal, round, and reactive to light.  Neck: Normal range of motion. Neck supple.  Cardiovascular: Normal rate, regular rhythm and normal heart sounds.   Pulmonary/Chest: Effort normal and breath sounds normal.  Abdominal: Soft. Bowel sounds are normal.  Musculoskeletal: She exhibits edema (+1 bilaterally). She exhibits no tenderness.  Neurological: She is alert and oriented to person, place, and time.  Skin: Skin is warm and dry. She is not diaphoretic. There is erythema (slightl redness around nickle sized abrasion to left lower extermity, no drainage or tenderness).  Psychiatric: Her mood appears anxious.   Labs reviewed: Basic Metabolic Panel:  Recent Labs  16/10/96 0346  02/07/15 0650 02/08/15 0418 02/19/15  NA 133*  --  134* 129* 133*  K 3.7  --  3.7 3.8 3.9  CL 100*  --  101 95*  --   CO2 24  --  26 22  --   GLUCOSE 225*  --  153* 343*  --     BUN 12  --  11 10 17   CREATININE 0.64  < > 0.62 0.77 0.8  CALCIUM 8.6*  --  8.6* 8.3*  --   < > = values in this interval not displayed. Liver Function Tests:  Recent Labs  02/19/15  AST 34  ALT 28  ALKPHOS 122   No results for input(s): LIPASE, AMYLASE in the last 8760 hours. No results for input(s): AMMONIA in the last 8760 hours. CBC:  Recent Labs  02/05/15 0538  02/06/15 1948 02/07/15 0650 02/08/15 0418 02/19/15  WBC 13.8*  < > 15.7* 8.8 8.6 9.6  NEUTROABS 12.1*  --   --   --   --   --   HGB 13.1  < > 12.2 11.3* 9.9* 10.5*  HCT 38.6  < > 35.7*  32.7* 28.4* 32*  MCV 97.7  < > 99.4 98.2 95.9  --   PLT 202  < > 175 165 168 457*  < > = values in this interval not displayed. Lipid Panel:  Recent Labs  02/08/15 0418  CHOL 136  HDL 52  LDLCALC 70  TRIG 72  CHOLHDL 2.6   TSH: No results for input(s): TSH in the last 8760 hours. A1C: Lab Results  Component Value Date   HGBA1C 8.0* 02/06/2015     Assessment/Plan 1. Essential hypertension - controlled on coreg and lisinopril  - Basic metabolic panel  2. Coronary artery disease involving native coronary artery of native heart without angina pectoris No chest pains. S/p cardiac cath. Pt not taking ASA at this time -to start ASA 81 mg daily, has follow up scheduled with cardiologist.  -to cont coreg and statin  3. Type 2 diabetes mellitus without complication - due to low blood sugars to decrease novolin to 25 units in the morning. Cont PM dose. To increase intake, good nutritional choices to help maintain caloric intake for better glycemic control.   4. Hypothyroidism, unspecified hypothyroidism type -currently taking synthroid 137 mcg ,will follow up  TSH  5. Hip fracture, right, closed, with routine healing, subsequent encounter S/P intramedullary implant. Walking well with walker, pain is well controlled with norco, to decrease norco to 1 tablet every 6 hours as needed for pain, may use tylenol to help  decrease norco use.   6. Tobacco abuse Already has cut back on cigarettes and plans to cont to do so.   7. Anemia, unspecified anemia type -blood loss after post op, will follow up CBC - CBC with Differential  8. Anxiety state -increased anxiety since she has been home.  - sertraline (ZOLOFT) 25 MG tablet; Take 1 tablet (25 mg total) by mouth daily.  Dispense: 30 tablet; Refill: 0  9. Abrasion -to keep area clean and dry, elevated Legs when sitting - mupirocin ointment (BACTROBAN) 2 %; Apply 1 application topically 2 (two) times daily.  Dispense: 22 g; Refill: 0 -to notify if area fails to improve or worsens with treatment  10. Hyponatremia Noted sodium in SNF of 133, will follow up today.   Follow up in 4 weeks for EV, MMSE and lab work before visit  45 minsTime TOTAL:  time greater than 50% of total time spent doing pt counseled and coordination of care regarding plan of care. Also in reviewing chart and epic notes.   Janene Harvey. Biagio Borg  [facility] [phone] 8 am - 5 pm) [phone] (after hours)

## 2015-03-08 ENCOUNTER — Other Ambulatory Visit: Payer: Self-pay | Admitting: *Deleted

## 2015-03-08 DIAGNOSIS — Z862 Personal history of diseases of the blood and blood-forming organs and certain disorders involving the immune mechanism: Secondary | ICD-10-CM

## 2015-03-08 LAB — CBC WITH DIFFERENTIAL/PLATELET
BASOS ABS: 0.1 10*3/uL (ref 0.0–0.2)
BASOS: 2 %
EOS (ABSOLUTE): 0.4 10*3/uL (ref 0.0–0.4)
Eos: 8 %
Hematocrit: 33.5 % — ABNORMAL LOW (ref 34.0–46.6)
Hemoglobin: 11.1 g/dL (ref 11.1–15.9)
IMMATURE GRANS (ABS): 0 10*3/uL (ref 0.0–0.1)
IMMATURE GRANULOCYTES: 0 %
LYMPHS: 25 %
Lymphocytes Absolute: 1.4 10*3/uL (ref 0.7–3.1)
MCH: 33 pg (ref 26.6–33.0)
MCHC: 33.1 g/dL (ref 31.5–35.7)
MCV: 100 fL — ABNORMAL HIGH (ref 79–97)
Monocytes Absolute: 0.6 10*3/uL (ref 0.1–0.9)
Monocytes: 11 %
NEUTROS PCT: 54 %
Neutrophils Absolute: 3.1 10*3/uL (ref 1.4–7.0)
PLATELETS: 332 10*3/uL (ref 150–379)
RBC: 3.36 x10E6/uL — AB (ref 3.77–5.28)
RDW: 13.8 % (ref 12.3–15.4)
WBC: 5.6 10*3/uL (ref 3.4–10.8)

## 2015-03-08 LAB — BASIC METABOLIC PANEL
BUN/Creatinine Ratio: 15 (ref 11–26)
BUN: 9 mg/dL (ref 8–27)
CALCIUM: 9.5 mg/dL (ref 8.7–10.3)
CHLORIDE: 100 mmol/L (ref 97–108)
CO2: 24 mmol/L (ref 18–29)
Creatinine, Ser: 0.61 mg/dL (ref 0.57–1.00)
GFR calc Af Amer: 101 mL/min/{1.73_m2} (ref >59)
GFR calc non Af Amer: 88 mL/min/{1.73_m2} (ref >59)
GLUCOSE: 46 mg/dL — AB (ref 65–99)
Potassium: 3.9 mmol/L (ref 3.5–5.2)
Sodium: 142 mmol/L (ref 134–144)

## 2015-03-08 LAB — TSH: TSH: 2.23 u[IU]/mL (ref 0.450–4.500)

## 2015-03-11 ENCOUNTER — Ambulatory Visit (INDEPENDENT_AMBULATORY_CARE_PROVIDER_SITE_OTHER): Payer: Self-pay | Admitting: Ophthalmology

## 2015-03-11 LAB — SPECIMEN STATUS REPORT

## 2015-03-11 LAB — FOLATE: Folate: >20 ng/mL (ref >3.0)

## 2015-03-11 LAB — VITAMIN B12: VITAMIN B 12: 511 pg/mL (ref 211–946)

## 2015-03-19 ENCOUNTER — Telehealth: Payer: Self-pay | Admitting: *Deleted

## 2015-03-19 NOTE — Telephone Encounter (Signed)
Patient called and stated that since the medication was increased she feels "crappy everyday and just can't get with it" She stated she cannot pin point which medication it is, she is wondering if it is the Zoloft or the Heart Medication that was increased to 2. States she has no appitite at all.  Please Advise.

## 2015-03-19 NOTE — Telephone Encounter (Signed)
Stop zoloft and see if symptoms improve off medication. Keep follow up, may schedule sooner appt if needed

## 2015-03-19 NOTE — Telephone Encounter (Signed)
Patient notified and agreed to stop

## 2015-03-20 ENCOUNTER — Telehealth: Payer: Self-pay | Admitting: Cardiovascular Disease

## 2015-03-20 NOTE — Telephone Encounter (Signed)
New Message  Pt dtr calling to speak w/ RN concerning pt's low BP and her "not feeling well" Pt dtr did not report any BP readings. Please call back and discuss.

## 2015-03-20 NOTE — Telephone Encounter (Signed)
Can decrease lisinopril to 10 mg

## 2015-03-20 NOTE — Telephone Encounter (Signed)
Patient's daughter is calling for her mother. Patient is complaining of not feeling well and low BP in the mornings. BP 100/60 HR 78 this am. Now patient's BP 130/60 HR 80's. Patient is taking all her medications except for Zoloft. Patient has been taking Norco and Ativan at nights. Informed patient to check BP in the am before taking her Lisinopril and if the SBP is lower than 114 wait a couple of hours and take BP again. She might could try taking her lisinopril around noon time. Informed patient to hydrate if her BP is low. Patient is feeling fine right now, but she just feels this way in the mornings. Patient wanted to know if Dr. Eden Emms wanted to change her BP medication. Will forward to Dr. Eden Emms for further instruction.

## 2015-03-21 ENCOUNTER — Encounter: Payer: Self-pay | Admitting: Nurse Practitioner

## 2015-03-21 ENCOUNTER — Ambulatory Visit (INDEPENDENT_AMBULATORY_CARE_PROVIDER_SITE_OTHER): Payer: Medicare Other | Admitting: Nurse Practitioner

## 2015-03-21 VITALS — BP 140/68 | HR 86 | Temp 98.1°F | Resp 20 | Ht 63.0 in | Wt 141.4 lb

## 2015-03-21 DIAGNOSIS — F411 Generalized anxiety disorder: Secondary | ICD-10-CM | POA: Diagnosis not present

## 2015-03-21 DIAGNOSIS — E119 Type 2 diabetes mellitus without complications: Secondary | ICD-10-CM

## 2015-03-21 DIAGNOSIS — I2510 Atherosclerotic heart disease of native coronary artery without angina pectoris: Secondary | ICD-10-CM

## 2015-03-21 DIAGNOSIS — I10 Essential (primary) hypertension: Secondary | ICD-10-CM | POA: Diagnosis not present

## 2015-03-21 MED ORDER — LISINOPRIL 10 MG PO TABS: 10.0000 mg | ORAL_TABLET | Freq: Every day | ORAL | Status: DC

## 2015-03-21 NOTE — Telephone Encounter (Signed)
Called and let patient's daughter know about Dr. Fabio Bering recommendation. Will send prescription to patient's pharmacy of choice.

## 2015-03-21 NOTE — Patient Instructions (Signed)
Take insulin NPH 27 units daily, and cont to take 5 units in the pm  Cont to take coreg twice daily and lisinopril to 10 mg daily   Wait to resume zoloft until you hear from [place] regarding lab work

## 2015-03-21 NOTE — Progress Notes (Signed)
Patient ID: Tara Flores, female   DOB: [birth year], 79 y.o.   MRN: [id number]    PCP: Sharon Seller, NP  Advanced Directive information Does patient have an advance directive?: Yes  No Known Allergies  Chief Complaint  Patient presents with  . Acute Visit    Wants to discuss medications, not feeling well     HPI: Patient is a 79 y.o. female seen in the office today reports she does not feel well. Blood pressure was 100/60 and just felt drained. Did not take any medication this morning. When she went to orthopedic doctor the blood pressure was in the 170s. Therefore she took her coreg. Has not taken her lisinopril. Placed a call to cardiology who reduced lisinopril 10 mg.  Not eating well, Eating TVs dinners.   Blood sugar 400 today at 3 pm. In the morning running around 200-300. Has adjusted her insulin herself and only taking 25 units of NPH or less. Following with endocrinologist who recommended Novolog but it was too expensive.   Does not know if she should take cholesterol medication.   Stopped zoloft due to decrease in blood pressure.   Anxious over falling and being alone. Going to [place] next week which pt reports will help her fear of being alone.   Daughter-in-law reports she is not eating well and not sleeping well at night  Review of Systems:  Review of Systems  Constitutional: Positive for fatigue. Negative for activity change, appetite change and unexpected weight change.  HENT: Negative for congestion and hearing loss.   Eyes: Negative.   Respiratory: Negative for cough and shortness of breath.   Cardiovascular: Negative for chest pain, palpitations and leg swelling.  Gastrointestinal: Negative for abdominal pain, diarrhea and constipation.  Genitourinary: Negative for difficulty urinating.  Musculoskeletal: Positive for arthralgias (to right hip, improving). Negative for myalgias.  Skin: Negative for color change.       Small abrasion to left lower leg  has healed  Neurological: Positive for weakness (ongoing). Negative for dizziness.  Psychiatric/Behavioral: Negative for behavioral problems, confusion and agitation. The patient is nervous/anxious.     Past Medical History  Diagnosis Date  . Hypertension   . Diabetes mellitus without complication   . Asthma   . Hypothyroidism   . Cardiac disease   . Hyperlipidemia   . CAD (coronary artery disease)   . Anxiety    Past Surgical History  Procedure Laterality Date  . Cardiac catheterization N/A 02/05/2015    Procedure: Left Heart Cath and Coronary Angiography;  Surgeon: Kathleene Hazel, MD;  Location: [facility] INVASIVE CV LAB;  Service: Cardiovascular;  Laterality: N/A;  . Intramedullary (im) nail intertrochanteric Right 02/06/2015    Procedure: INTRAMEDULLARY (IM) NAIL RIGHT HIP;  Surgeon: Tarry Kos, MD;  Location: [facility] OR;  Service: Orthopedics;  Laterality: Right;  . Hip fracture surgery    . Eye surgery  2006    unsure if exact procedure   Social History:   reports that she has been smoking.  She has never used smokeless tobacco. She reports that she does not drink alcohol or use illicit drugs.  Family History  Problem Relation Age of Onset  . Alcohol abuse Father   . Cancer Mother     lung  . Heart disease Mother   . Diabetes Sister   . Heart disease Sister     Medications: Patient's Medications  New Prescriptions   No medications on file  Previous Medications  ALBUTEROL (PROVENTIL HFA;VENTOLIN HFA) 108 (90 BASE) MCG/ACT INHALER    Inhale 1 puff into the lungs every 6 (six) hours as needed for wheezing or shortness of breath.   ATORVASTATIN (LIPITOR) 20 MG TABLET    Take 1 tablet (20 mg total) by mouth daily at 6 PM.   CARVEDILOL (COREG) 6.25 MG TABLET    Take 1 tablet (6.25 mg total) by mouth 2 (two) times daily with a meal.   HYDROCODONE-ACETAMINOPHEN (NORCO) 7.5-325 MG PER TABLET    Take 1 tablet by mouth every 6 (six) hours as needed for moderate pain.   INSULIN  NPH HUMAN (HUMULIN N,NOVOLIN N) 100 UNIT/ML INJECTION    Novolin N 25 units SQ Q AM; 5-6 units SQ q pm   LEVOTHYROXINE (SYNTHROID, LEVOTHROID) 137 MCG TABLET    Take 137 mcg by mouth daily before breakfast.   LISINOPRIL (PRINIVIL,ZESTRIL) 10 MG TABLET    Take 1 tablet (10 mg total) by mouth daily.   LORAZEPAM (ATIVAN) 0.5 MG TABLET    Take 0.5 mg by mouth every 6 (six) hours as needed for anxiety.   MUPIROCIN OINTMENT (BACTROBAN) 2 %    Apply 1 application topically 2 (two) times daily.   SERTRALINE (ZOLOFT) 25 MG TABLET    Take 1 tablet (25 mg total) by mouth daily.  Modified Medications   No medications on file  Discontinued Medications   No medications on file     Physical Exam:  Filed Vitals:   03/21/15 1611  BP: 140/68  Pulse: 86  Temp: 98.1 F (36.7 C)  TempSrc: Oral  Resp: 20  Height:  (1.6 m)  Weight: 141 lb 6.4 oz (64.139 kg)  SpO2: 97%   Body mass index is 25.05 kg/(m^2).  Physical Exam  Constitutional: She is oriented to person, place, and time. No distress.  HENT:  Head: Normocephalic and atraumatic.  Mouth/Throat: Oropharynx is clear and moist. No oropharyngeal exudate.  Eyes: Conjunctivae are normal. Pupils are equal, round, and reactive to light.  Neck: Normal range of motion. Neck supple.  Cardiovascular: Normal rate, regular rhythm and normal heart sounds.   Pulmonary/Chest: Effort normal and breath sounds normal.  Abdominal: Soft. Bowel sounds are normal.  Musculoskeletal: She exhibits no edema or tenderness.  Neurological: She is alert and oriented to person, place, and time.  Skin: Skin is warm and dry. She is not diaphoretic. No erythema.  Psychiatric: Her mood appears anxious.    Labs reviewed: Basic Metabolic Panel:  Recent Labs  25/36/64 0650 02/08/15 0418 02/19/15 03/07/15 1026  NA 134* 129* 133* 142  K 3.7 3.8 3.9 3.9  CL 101 95*  --  100  CO2 26 22  --  24  GLUCOSE 153* 343*  --  46*  BUN CREATININE 0.62 0.77 0.8  0.61  CALCIUM 8.6* 8.3*  --  9.5  TSH  --   --   --  2.230   Liver Function Tests:  Recent Labs  02/19/15  AST 34  ALT 28  ALKPHOS 122   No results for input(s): LIPASE, AMYLASE in the last 8760 hours. No results for input(s): AMMONIA in the last 8760 hours. CBC:  Recent Labs  02/05/15 0538  02/06/15 1948 02/07/15 0650 02/08/15 0418 02/19/15 03/07/15 1026  WBC 13.8*  < > 15.7* 8.8 8.6 9.6 5.6  NEUTROABS 12.1*  --   --   --   --   --  3.1  HGB 13.1  < >  12.2 11.3* 9.9* 10.5*  --   HCT 38.6  < > 35.7* 32.7* 28.4* 32* 33.5*  MCV 97.7  < > 99.4 98.2 95.9  --   --   PLT 202  < > 175 165 168 457*  --   < > = values in this interval not displayed. Lipid Panel:  Recent Labs  02/08/15 0418  CHOL 136  HDL 52  LDLCALC 70  TRIG 72  CHOLHDL 2.6   TSH:  Recent Labs  03/07/15 1026  TSH 2.230   A1C: Lab Results  Component Value Date   HGBA1C 8.0* 02/06/2015     Assessment/Plan 1. Essential hypertension -elevated without blood pressure, cont on coreg twice daily, to cont lisinopril at reduced dose of 10 mg daily - pt eating foods high in sodium (freezer meals) discussed proper nutrition and water intake -daughter in law to help with meals   2. Type 2 diabetes mellitus without complication -elevated blood sugars due to reduced insulin.  -Take insulin NPH 27 units daily, and cont to take 5 units in the pm -cont to take blood sugars and record. Pt following  With endocrinologist who manages diabetes, to make follow up with endocrinologist prior to next visit  -encouraged proper diet, it seems like she is not eating correctly which could also be contributing to not feeling well.   3. Anxiety state -worse, fears of being alone, not taking her medication in fear of side effects -unclear if she has actually been taking zoloft or not, does reports she has been told in the past she has hyponatremia (prior to hospitalization) -will stop zoloft and follow up  Basic metabolic  panel -will consider start of remeron once labs return as she is not sleeping well either  4. Coronary artery disease involving native coronary artery of native heart without angina pectoris -no ongoing chest pains, question need for crestor but discussed due to extensive vessel disease the need to take medication. Pt agrees and will take for now.   To keep follow up for 1 month.    Janene Harvey. Biagio Borg  [facility] [phone] 8 am - 5 pm) [phone] (after hours)

## 2015-03-22 ENCOUNTER — Telehealth: Payer: Self-pay

## 2015-03-22 LAB — BASIC METABOLIC PANEL
BUN / CREAT RATIO: 17 (ref 11–26)
BUN: 12 mg/dL (ref 8–27)
CHLORIDE: 90 mmol/L — AB (ref 97–108)
CO2: 23 mmol/L (ref 18–29)
Calcium: 9.1 mg/dL (ref 8.7–10.3)
Creatinine, Ser: 0.69 mg/dL (ref 0.57–1.00)
GFR calc Af Amer: 97 mL/min/{1.73_m2} (ref >59)
GFR calc non Af Amer: 84 mL/min/{1.73_m2} (ref >59)
GLUCOSE: 391 mg/dL — AB (ref 65–99)
Potassium: 4.3 mmol/L (ref 3.5–5.2)
SODIUM: 130 mmol/L — AB (ref 134–144)

## 2015-03-22 NOTE — Telephone Encounter (Signed)
Called patient about lab results and Shanda Bumps suggested calling in Remeron  # 30 take one at bedtime for rest and appetite. She doesn't want [place] to call it in yet, she can't afford it and leaving for [place]. She will call back when she is ready for it. (some time about 2nd Wed of the month)

## 2015-04-15 ENCOUNTER — Other Ambulatory Visit: Payer: Medicare Other

## 2015-04-15 DIAGNOSIS — E871 Hypo-osmolality and hyponatremia: Secondary | ICD-10-CM

## 2015-04-15 DIAGNOSIS — Z862 Personal history of diseases of the blood and blood-forming organs and certain disorders involving the immune mechanism: Secondary | ICD-10-CM

## 2015-04-16 ENCOUNTER — Ambulatory Visit: Payer: Medicare Other | Admitting: Nurse Practitioner

## 2015-04-16 LAB — FOLATE: Folate: 14.9 ng/mL (ref >3.0)

## 2015-04-16 LAB — BASIC METABOLIC PANEL
BUN / CREAT RATIO: 21 (ref 11–26)
BUN: 12 mg/dL (ref 8–27)
CHLORIDE: 97 mmol/L (ref 97–106)
CO2: 23 mmol/L (ref 18–29)
Calcium: 9.5 mg/dL (ref 8.7–10.3)
Creatinine, Ser: 0.56 mg/dL — ABNORMAL LOW (ref 0.57–1.00)
GFR calc Af Amer: 104 mL/min/{1.73_m2} (ref >59)
GFR calc non Af Amer: 90 mL/min/{1.73_m2} (ref >59)
GLUCOSE: 213 mg/dL — AB (ref 65–99)
Potassium: 4 mmol/L (ref 3.5–5.2)
SODIUM: 141 mmol/L (ref 136–144)

## 2015-04-16 LAB — VITAMIN B12: VITAMIN B 12: 387 pg/mL (ref 211–946)

## 2015-04-18 ENCOUNTER — Encounter: Payer: Self-pay | Admitting: Nurse Practitioner

## 2015-04-18 ENCOUNTER — Ambulatory Visit (INDEPENDENT_AMBULATORY_CARE_PROVIDER_SITE_OTHER): Payer: Medicare Other | Admitting: Nurse Practitioner

## 2015-04-18 VITALS — BP 138/70 | HR 86 | Temp 97.8°F | Resp 20 | Ht 63.0 in | Wt 135.2 lb

## 2015-04-18 DIAGNOSIS — I2510 Atherosclerotic heart disease of native coronary artery without angina pectoris: Secondary | ICD-10-CM | POA: Diagnosis not present

## 2015-04-18 DIAGNOSIS — E119 Type 2 diabetes mellitus without complications: Secondary | ICD-10-CM

## 2015-04-18 DIAGNOSIS — I10 Essential (primary) hypertension: Secondary | ICD-10-CM | POA: Diagnosis not present

## 2015-04-18 DIAGNOSIS — S72001D Fracture of unspecified part of neck of right femur, subsequent encounter for closed fracture with routine healing: Secondary | ICD-10-CM

## 2015-04-18 DIAGNOSIS — Z794 Long term (current) use of insulin: Secondary | ICD-10-CM

## 2015-04-18 DIAGNOSIS — E871 Hypo-osmolality and hyponatremia: Secondary | ICD-10-CM | POA: Diagnosis not present

## 2015-04-18 DIAGNOSIS — F411 Generalized anxiety disorder: Secondary | ICD-10-CM | POA: Diagnosis not present

## 2015-04-18 DIAGNOSIS — D649 Anemia, unspecified: Secondary | ICD-10-CM

## 2015-04-18 NOTE — Patient Instructions (Signed)
Increase Novolin N to 32 units at breakfast  If blood sugar is over 200 at lunch and dinner take 4 units of Novolin R  Cont atorvastatin 20 mg daily  Keep follow up with cardiology   Follow up in 6 weeks for physical with lab work before visit

## 2015-04-18 NOTE — Progress Notes (Signed)
Patient ID: Tara Flores, female   DOB: 03/24/1936, 79 y.o.   MRN: [id number]    PCP: Sharon Seller, NP  Advanced Directive information    No Known Allergies  Chief Complaint  Patient presents with  . Medical Management of Chronic Issues    medication questions,     HPI: Patient is a 79 y.o. female seen in the office today for routine follow up. Just got back from [place] and had a great time. Reports she is not needing any pain medication. Anxiety has greatly improved after she saw something on TV and has prayed over it and it has been 100% better.  Still does not want to take cholesterol medication. Reports it is making her blood sugar worse. When she did not take her medication her blood sugars were much better control  Blood sugars staying in the 300s at this time. No hypoglycemia noted. Taking Humulin NPH 29 units during the day and 5 units at bedtime.   Review of Systems:  Review of Systems  Constitutional: Negative for activity change, appetite change, fatigue and unexpected weight change.  HENT: Negative for congestion and hearing loss.   Eyes: Negative.   Respiratory: Negative for cough and shortness of breath.   Cardiovascular: Negative for chest pain, palpitations and leg swelling.  Gastrointestinal: Negative for abdominal pain, diarrhea and constipation.  Genitourinary: Negative for difficulty urinating.  Musculoskeletal: Negative for myalgias and arthralgias.  Skin: Negative for color change.  Neurological: Negative for dizziness and weakness.  Psychiatric/Behavioral: Negative for behavioral problems, confusion and agitation. The patient is not nervous/anxious.     Past Medical History  Diagnosis Date  . Hypertension   . Diabetes mellitus without complication (HCC)   . Asthma   . Hypothyroidism   . Cardiac disease   . Hyperlipidemia   . CAD (coronary artery disease)   . Anxiety    Past Surgical History  Procedure Laterality Date  . Cardiac  catheterization N/A 02/05/2015    Procedure: Left Heart Cath and Coronary Angiography;  Surgeon: Kathleene Hazel, MD;  Location: [facility] INVASIVE CV LAB;  Service: Cardiovascular;  Laterality: N/A;  . Intramedullary (im) nail intertrochanteric Right 02/06/2015    Procedure: INTRAMEDULLARY (IM) NAIL RIGHT HIP;  Surgeon: Tarry Kos, MD;  Location: [facility] OR;  Service: Orthopedics;  Laterality: Right;  . Hip fracture surgery    . Eye surgery  2006    unsure if exact procedure   Social History:   reports that she has been smoking.  She has never used smokeless tobacco. She reports that she does not drink alcohol or use illicit drugs.  Family History  Problem Relation Age of Onset  . Alcohol abuse Father   . Cancer Mother     lung  . Heart disease Mother   . Diabetes Sister   . Heart disease Sister     Medications: Patient's Medications  New Prescriptions   No medications on file  Previous Medications   ACCU-CHEK SMARTVIEW TEST STRIP       ALBUTEROL (PROVENTIL HFA;VENTOLIN HFA) 108 (90 BASE) MCG/ACT INHALER    Inhale 1 puff into the lungs every 6 (six) hours as needed for wheezing or shortness of breath.   ATORVASTATIN (LIPITOR) 20 MG TABLET    Take 1 tablet (20 mg total) by mouth daily at 6 PM.   CARVEDILOL (COREG) 6.25 MG TABLET    Take 1 tablet (6.25 mg total) by mouth 2 (two) times daily with a meal.  INSULIN NPH HUMAN (HUMULIN N,NOVOLIN N) 100 UNIT/ML INJECTION    Novolin N 27 units SQ Q AM; 5-6 units SQ q pm   LEVOTHYROXINE (SYNTHROID, LEVOTHROID) 137 MCG TABLET    Take 137 mcg by mouth daily before breakfast.   LISINOPRIL (PRINIVIL,ZESTRIL) 10 MG TABLET    Take 1 tablet (10 mg total) by mouth daily.  Modified Medications   No medications on file  Discontinued Medications   HYDROCODONE-ACETAMINOPHEN (NORCO) 7.5-325 MG PER TABLET    Take 1 tablet by mouth every 6 (six) hours as needed for moderate pain.   LORAZEPAM (ATIVAN) 0.5 MG TABLET    Take 0.5 mg by mouth every 6 (six) hours  as needed for anxiety.   MUPIROCIN OINTMENT (BACTROBAN) 2 %    Apply 1 application topically 2 (two) times daily.   SERTRALINE (ZOLOFT) 25 MG TABLET    Take 1 tablet (25 mg total) by mouth daily.     Physical Exam:  Filed Vitals:   04/18/15 1532  BP: 138/70  Pulse: 86  Temp: 97.8 F (36.6 C)  TempSrc: Oral  Resp: 20  Height: 5\' 3"  (1.6 m)  Weight: 135 lb 3.2 oz (61.326 kg)  SpO2: 98%   Body mass index is 23.96 kg/(m^2).  Physical Exam  Constitutional: She is oriented to person, place, and time. No distress.  HENT:  Head: Normocephalic and atraumatic.  Mouth/Throat: Oropharynx is clear and moist. No oropharyngeal exudate.  Eyes: Conjunctivae are normal. Pupils are equal, round, and reactive to light.  Neck: Normal range of motion. Neck supple.  Cardiovascular: Normal rate, regular rhythm and normal heart sounds.   Pulmonary/Chest: Effort normal and breath sounds normal.  Abdominal: Soft. Bowel sounds are normal.  Musculoskeletal: She exhibits no edema or tenderness.  Neurological: She is alert and oriented to person, place, and time.  Skin: Skin is warm and dry. She is not diaphoretic. No erythema.  Psychiatric: She has a normal mood and affect.    Labs reviewed: Basic Metabolic Panel:  Recent Labs  16/03/9608/15/16 1026 03/21/15 1648 04/15/15 0924  NA 142 130* 141  K 3.9 4.3 4.0  CL 100 90* 97  CO2 24 23 23   GLUCOSE 46* 391* 213*  BUN 9 12 12   CREATININE 0.61 0.69 0.56*  CALCIUM 9.5 9.1 9.5  TSH 2.230  --   --    Liver Function Tests:  Recent Labs  02/19/15  AST 34  ALT 28  ALKPHOS 122   No results for input(s): LIPASE, AMYLASE in the last 8760 hours. No results for input(s): AMMONIA in the last 8760 hours. CBC:  Recent Labs  02/05/15 0538  02/06/15 1948 02/07/15 0650 02/08/15 0418 02/19/15 03/07/15 1026  WBC 13.8*  < > 15.7* 8.8 8.6 9.6 5.6  NEUTROABS 12.1*  --   --   --   --   --  3.1  HGB 13.1  < > 12.2 11.3* 9.9* 10.5*  --   HCT 38.6  < >  35.7* 32.7* 28.4* 32* 33.5*  MCV 97.7  < > 99.4 98.2 95.9  --   --   PLT 202  < > 175 165 168 457*  --   < > = values in this interval not displayed. Lipid Panel:  Recent Labs  02/08/15 0418  CHOL 136  HDL 52  LDLCALC 70  TRIG 72  CHOLHDL 2.6   TSH:  Recent Labs  03/07/15 1026  TSH 2.230   A1C: Lab Results  Component Value Date  HGBA1C 8.0* 02/06/2015     Assessment/Plan 1. Type 2 diabetes mellitus without complication, with long-term current use of insulin (HCC) -elevated blood sugars, no hypoglycemia. -Increase Novolin N to 32 units at breakfast  -If blood sugar is over 200 at lunch and dinner take 4 units of Novolin R  2. Anxiety state -improved, has not taken any medication. Will monitor  3. Coronary artery disease involving native coronary artery of native heart without angina pectoris Stable, discussed need of cardiac medication as well as statin.   4. Hyponatremia -improved on recent labs  5. Hip fracture, right, closed, with routine healing, subsequent encounter -without pain, improved gait and functional status.  6. Anemia, unspecified anemia type Stable, b12 and folate reviewed with pt  7. Essential hypertension -blood pressure stable, cont on coreg and lisinopril  Follow up in 1 month for EV with MMSE   Doaa Kendzierski K. Biagio Borg  [facility] [phone] 8 am - 5 pm) [phone] (after hours)

## 2015-05-03 ENCOUNTER — Encounter: Payer: Self-pay | Admitting: *Deleted

## 2015-05-07 ENCOUNTER — Telehealth: Payer: Self-pay | Admitting: Cardiovascular Disease

## 2015-05-07 NOTE — Telephone Encounter (Signed)
Pt c/o medication issue: 1. Name of Medication: Atorvastatin 20mg   2. How are you currently taking this medication (dosage and times per day)? Pt stop taking this medication 3wks ago  3. Are you having a reaction (difficulty breathing--STAT)?  No  4. What is your medication issue? Pt stop taking this mediation because she stated it was making her blood sugar high. Pt want to know if she need to take this medication even if her blood sugar is high. Please advise

## 2015-05-07 NOTE — Telephone Encounter (Signed)
Pt calling to inform Dr Eden EmmsNishan that she stopped taking her atorvastatin because she states its causing her blood sugar to be elevated.  Pt states that since she stopped taking this medication 3 weeks ago, she is feeling much better, and her BS is now staying at around 150, which she states "is great for me." Informed the pt that there is no atorvastatin listed on her med list, and I'm unsure of who actually discontinued this medication.  Pt is unsure as well.  Pt just wanted to make Dr Eden EmmsNishan aware of this, and asked if there is anything else she should take in place of the atorvastatin, or can she just remain off of statins all together.  Pt states "I will be compliant with whatever he suggest." Informed the pt that Dr Eden EmmsNishan and his nurse are both out of the office today, but I will route this message to them for further review and recommendation, and follow-up with the pt thereafter.  Pt verbalized understanding and agrees with this plan.

## 2015-05-07 NOTE — Telephone Encounter (Signed)
Cant see that I've seen this patient at least not recently  LDL was 70 presumed on statin Ok to stay off for 3 months and f/u with primary for lipid panel

## 2015-05-08 NOTE — Telephone Encounter (Signed)
PT  AWARE./CY 

## 2015-05-10 ENCOUNTER — Encounter: Payer: Medicare Other | Admitting: Cardiovascular Disease

## 2015-05-20 ENCOUNTER — Other Ambulatory Visit: Payer: Self-pay | Admitting: *Deleted

## 2015-05-20 MED ORDER — CARVEDILOL 6.25 MG PO TABS: 6.2500 mg | ORAL_TABLET | Freq: Two times a day (BID) | ORAL | Status: DC

## 2015-05-20 NOTE — Telephone Encounter (Signed)
[facility] [place] 

## 2015-05-30 ENCOUNTER — Encounter: Payer: Medicare Other | Admitting: Nurse Practitioner

## 2015-07-03 ENCOUNTER — Encounter: Payer: Medicare Other | Admitting: Cardiovascular Disease

## 2015-07-04 ENCOUNTER — Other Ambulatory Visit: Payer: Self-pay | Admitting: Adult Health

## 2015-07-15 NOTE — Progress Notes (Signed)
Patient ID: Tara Flores, female   DOB: 1935/09/16, 80 y.o.   MRN: [id number]     Cardiology Office Note   Date:  07/15/2015   ID:  Tara Flores, DOB 10/31/1935, MRN [id number]  PCP:  Sharon Seller, NP  Cardiologist:   Charlton Haws, MD   No chief complaint on file.     History of Present Illness: Tara Flores is a 80 y.o. female who presents for post hospital f/u .  Had mechanical fall in August and we were asked to clear for surgery   No known history of coronary disease but with multiple risk factors including hypertension, insulin dependent diabetes and a 60 + yr h/o tobacco abuse,  Her EKG demonstrates diffuse ST depressions in both the inferior and lateral leads. There are no prior EKGs to compare. She had a heart cath to clear her for surgery Moderate LAD disease with normal flow wire.  No post op complications  Cath 02/05/15 films reviewed   Prox Cx to Mid Cx lesion, 30% stenosed.  Prox RCA lesion, 30% stenosed.  Mid RCA lesion, 30% stenosed.  Dist RCA lesion, 30% stenosed.  Prox LAD to Dist LAD lesion, 60% stenosed.  The left ventricular systolic function is normal.  1. Triple vessel CAD with mild to moderate diffuse disease in the heavily calcified RCA and Circumflex system.  2. Moderate heavily calcified stenosis mid LAD. Fractional flow reserve 0.81 suggesting the stenosis is moderate and not flow limiting.  3. Normal LV systolic function  Echo with normal EF Carotid with plaque no stenosis   She has very poor vision and sees [employer] with cane no chest pain  She is independent but would appear to need some help Has 3 children in town that rallyed when she broke her hip but tend not to see her a lot outside of emergencies   Past Medical History  Diagnosis Date  . Hypertension   . Diabetes mellitus without complication (HCC)   . Asthma   . Hypothyroidism   . Cardiac disease   . Hyperlipidemia   . CAD (coronary artery disease)   . Anxiety      Past Surgical History  Procedure Laterality Date  . Cardiac catheterization N/A 02/05/2015    Procedure: Left Heart Cath and Coronary Angiography;  Surgeon: Kathleene Hazel, MD;  Location: [facility] INVASIVE CV LAB;  Service: Cardiovascular;  Laterality: N/A;  . Intramedullary (im) nail intertrochanteric Right 02/06/2015    Procedure: INTRAMEDULLARY (IM) NAIL RIGHT HIP;  Surgeon: Tarry Kos, MD;  Location: [facility] OR;  Service: Orthopedics;  Laterality: Right;  . Hip fracture surgery    . Eye surgery  2006    unsure if exact procedure     Current Outpatient Prescriptions  Medication Sig Dispense Refill  . ACCU-CHEK SMARTVIEW test strip     . albuterol (PROVENTIL HFA;VENTOLIN HFA) 108 (90 BASE) MCG/ACT inhaler Inhale 1 puff into the lungs every 6 (six) hours as needed for wheezing or shortness of breath.    . carvedilol (COREG) 6.25 MG tablet Take 1 tablet (6.25 mg total) by mouth 2 (two) times daily with a meal. 60 tablet 3  . insulin NPH Human (HUMULIN N,NOVOLIN N) 100 UNIT/ML injection Novolin N 27 units SQ Q AM; 5-6 units SQ q pm    . levothyroxine (SYNTHROID, LEVOTHROID) 137 MCG tablet TAKE ONE TABLET BY MOUTH ONCE DAILY 30 tablet 3  . lisinopril (PRINIVIL,ZESTRIL) 10 MG tablet Take 1 tablet (10  mg total) by mouth daily. 90 tablet 3   No current facility-administered medications for this visit.    Allergies:   Review of patient's allergies indicates no known allergies.    Social History:  The patient  reports that she has been smoking.  She has never used smokeless tobacco. She reports that she does not drink alcohol or use illicit drugs.   Family History:  The patient's family history includes Alcohol abuse in her father; Cancer in her mother; Diabetes in her sister; Heart disease in her mother and sister.    ROS:  Please see the history of present illness.   Otherwise, review of systems are positive for none.   All other systems are reviewed and negative.    PHYSICAL  EXAM: VS:  There were no vitals taken for this visit. , BMI There is no weight on file to calculate BMI. Affect appropriate Chronically ill white female  HEENT: normal Neck supple with no adenopathy JVP normal no bruits no thyromegaly Lungs clear COPD expitory wheezing  Heart:  S1/S2 no murmur, no rub, gallop or click PMI normal Abdomen: benighn, BS positve, no tenderness, no AAA no bruit.  No HSM or HJR Distal pulses intact with no bruits No edema Neuro non-focal Skin warm and dry No muscular weakness    EKG:    02/06/15  SR rate 76 normal    Recent Labs: 02/19/2015: ALT 28; Hemoglobin 10.5* 03/07/2015: Platelets 332; TSH 2.230 04/15/2015: BUN 12; Creatinine, Ser 0.56*; Potassium 4.0; Sodium 141    Lipid Panel    Component Value Date/Time   CHOL 136 02/08/2015 0418   TRIG 72 02/08/2015 0418   HDL 52 02/08/2015 0418   CHOLHDL 2.6 02/08/2015 0418   VLDL 14 02/08/2015 0418   LDLCALC 70 02/08/2015 0418      Wt Readings from Last 3 Encounters:  04/18/15 61.326 kg (135 lb 3.2 oz)  03/21/15 64.139 kg (141 lb 6.4 oz)  03/07/15 66.497 kg (146 lb 9.6 oz)      Other studies Reviewed: Additional studies/ records that were reviewed today include: Epic notes labs, Xrays and hospital consult notes with ECG see HPI.    ASSESSMENT AND PLAN:  1.  CAD Stable with no angina and good activity level.  Continue medical Rx nitro called in  2. Hip fracture improved walking with cane PT/OT  3. HTN: Well controlled.  Continue current medications and low sodium Dash type diet.   4. Chol:  Will have labs checked with primary Intolerant to lipitor Consider trying zocor  Cholesterol is at goal.  Continue current dose of statin and diet Rx.  No myalgias or side effects.  F/U  LFT's in 6 months. Lab Results  Component Value Date   LDLCALC 70 02/08/2015            5. DM Discussed low carb diet.  Target hemoglobin A1c is 6.5 or less.  Continue current medications. 6. COPD:  Discussed  smoking cessation This clinically will be her biggest issue going forward consider Referral to pulmonary    Current medicines are reviewed at length with the patient today.  The patient does not have concerns regarding medicines.  The following changes have been made:  no change  Labs/ tests ordered today include:    No orders of the defined types were placed in this encounter.     Disposition:   FU with me in 6 months      Signed, Charlton Haws, MD  07/15/2015  3:15 PM    [facility] [address] [place], [place], [place]  [address] Phone: [phone]; Fax: [phone]

## 2015-07-16 ENCOUNTER — Encounter: Payer: Self-pay | Admitting: Cardiovascular Disease

## 2015-07-18 ENCOUNTER — Encounter: Payer: Self-pay | Admitting: Cardiovascular Disease

## 2015-07-18 ENCOUNTER — Ambulatory Visit (INDEPENDENT_AMBULATORY_CARE_PROVIDER_SITE_OTHER): Payer: Medicare Other | Admitting: Cardiovascular Disease

## 2015-07-18 VITALS — BP 142/70 | HR 63 | Ht 62.0 in | Wt 139.8 lb

## 2015-07-18 DIAGNOSIS — I2510 Atherosclerotic heart disease of native coronary artery without angina pectoris: Secondary | ICD-10-CM | POA: Diagnosis not present

## 2015-07-18 DIAGNOSIS — E119 Type 2 diabetes mellitus without complications: Secondary | ICD-10-CM | POA: Diagnosis not present

## 2015-07-18 DIAGNOSIS — I10 Essential (primary) hypertension: Secondary | ICD-10-CM | POA: Diagnosis not present

## 2015-07-18 DIAGNOSIS — Z7689 Persons encountering health services in other specified circumstances: Secondary | ICD-10-CM

## 2015-07-18 MED ORDER — NITROGLYCERIN 0.4 MG SL SUBL: 0.4000 mg | SUBLINGUAL_TABLET | SUBLINGUAL | Status: AC | PRN

## 2015-07-18 NOTE — Patient Instructions (Addendum)
Medication Instructions:  Your physician has recommended you make the following change in your medication:  1-START Nitroglycerin 0.4 mg under the tongue as needed for chest pain. Take 1 NTG, under your tongue, while sitting. If no relief of pain may repeat NTG, one tab every 5 minutes up to 3 tablets total over 15 minutes. If no relief CALL 911. If you have dizziness/lightheadness while taking NTG, stop taking and call 911.  Labwork: NONE  Testing/Procedures: NONE  Follow-Up: Your physician wants you to follow-up in: 6 months with Dr. Eden Emms. You will receive a reminder letter in the mail two months in advance. If you don't receive a letter, please call our office to schedule the follow-up appointment.  If you need a refill on your cardiac medications before your next appointment, please call your pharmacy.

## 2015-08-20 ENCOUNTER — Ambulatory Visit (INDEPENDENT_AMBULATORY_CARE_PROVIDER_SITE_OTHER): Payer: Medicare Other | Admitting: Nurse Practitioner

## 2015-08-20 VITALS — BP 146/70 | HR 65 | Temp 97.5°F | Resp 20 | Ht 61.0 in | Wt 142.2 lb

## 2015-08-20 DIAGNOSIS — E871 Hypo-osmolality and hyponatremia: Secondary | ICD-10-CM

## 2015-08-20 DIAGNOSIS — E039 Hypothyroidism, unspecified: Secondary | ICD-10-CM

## 2015-08-20 DIAGNOSIS — E119 Type 2 diabetes mellitus without complications: Secondary | ICD-10-CM

## 2015-08-20 DIAGNOSIS — Z0000 Encounter for general adult medical examination without abnormal findings: Secondary | ICD-10-CM

## 2015-08-20 DIAGNOSIS — D649 Anemia, unspecified: Secondary | ICD-10-CM

## 2015-08-20 DIAGNOSIS — H6123 Impacted cerumen, bilateral: Secondary | ICD-10-CM

## 2015-08-20 DIAGNOSIS — E785 Hyperlipidemia, unspecified: Secondary | ICD-10-CM | POA: Diagnosis not present

## 2015-08-20 DIAGNOSIS — S72001D Fracture of unspecified part of neck of right femur, subsequent encounter for closed fracture with routine healing: Secondary | ICD-10-CM

## 2015-08-20 DIAGNOSIS — Z794 Long term (current) use of insulin: Secondary | ICD-10-CM

## 2015-08-20 NOTE — Progress Notes (Signed)
Patient ID: Tara Flores, female   DOB: 1935-07-06, 80 y.o.   MRN: [id number]    PCP: Sharon Seller, NP  Advanced Directive information Does patient have an advance directive?: No, Would patient like information on creating an advanced directive?: No - patient declined information  No Known Allergies  Chief Complaint  Patient presents with  . Annual Exam     HPI: Patient is a 80 y.o. female seen in the office today for annual exam Screenings: Colon Cancer- declining colonoscopy- will get cologuard Breast Cancer- does not get routine mammograms anymore Cervical Cancer- aged out.  Osteoporosis- Dexa Scan- needs, would like this done  Does not want excessive testing/screening done.  Depression screening Depression screen [facility] 2/9 08/20/2015  Decreased Interest 0  Down, Depressed, Hopeless 0  PHQ - 2 Score 0   Falls Fall Risk  08/20/2015 08/20/2015 04/18/2015 03/21/2015 03/07/2015  Falls in the past year? Yes No No No Yes  Number falls in past yr: 1 - - - 2 or more  Injury with Fall? Yes - - - Yes  Risk for fall due to : History of fall(s) - - - -   MMSE MMSE - Mini Mental State Exam 08/20/2015  Orientation to time 5  Orientation to Place 5  Registration 3  Attention/ Calculation 5  Recall 3  Language- name 2 objects 2  Language- repeat 1  Language- follow 3 step command 3  Language- read & follow direction 1  Write a sentence 1  Copy design 0  Total score 29     Vaccines Immunization History  Administered Date(s) Administered  . Tdap 08/18/2014    Smoking status: current smoker- 1 ppd Alcohol use: -previously drank wine, none since august.   Dentist: dentures upper and lower, does not go to the dentist routinely  Ophthalmologist: yearly- did not go last year, needs visit   Exercise regimen: none currently Diet: none  Functional Status of ADLs: independent of all ADLs  Incontinence-none  Pt following with Dr Horald Pollen- endocrinologist for hypothyroid and  diabetes. Dr Lurene Shadow following A1c and she reports was over 9 this month- insulin has been adjusted and she reports she is eating better.    Review of Systems:  Review of Systems  Constitutional: Negative for activity change, appetite change, fatigue and unexpected weight change.  HENT: Positive for hearing loss. Negative for congestion.   Eyes: Positive for visual disturbance (worsening vision- overdue to eye MD appt).  Respiratory: Negative for cough and shortness of breath.   Cardiovascular: Negative for chest pain, palpitations and leg swelling.  Gastrointestinal: Negative for abdominal pain, diarrhea and constipation.  Genitourinary: Negative for difficulty urinating.  Musculoskeletal: Negative for myalgias and arthralgias.  Skin: Negative for color change.  Neurological: Negative for dizziness and weakness.  Psychiatric/Behavioral: Negative for behavioral problems, confusion and agitation. The patient is not nervous/anxious.     Past Medical History  Diagnosis Date  . Hypertension   . Diabetes mellitus without complication (HCC)   . Asthma   . Hypothyroidism   . Cardiac disease   . Hyperlipidemia   . CAD (coronary artery disease)   . Anxiety    Past Surgical History  Procedure Laterality Date  . Cardiac catheterization N/A 02/05/2015    Procedure: Left Heart Cath and Coronary Angiography;  Surgeon: Kathleene Hazel, MD;  Location: [facility] INVASIVE CV LAB;  Service: Cardiovascular;  Laterality: N/A;  . Intramedullary (im) nail intertrochanteric Right 02/06/2015    Procedure: INTRAMEDULLARY (IM) NAIL  RIGHT HIP;  Surgeon: Tarry Kos, MD;  Location: [facility] OR;  Service: Orthopedics;  Laterality: Right;  . Hip fracture surgery    . Eye surgery  2006    unsure if exact procedure   Social History:   reports that she has been smoking.  She has never used smokeless tobacco. She reports that she does not drink alcohol or use illicit drugs.  Family History  Problem Relation Age of  Onset  . Alcohol abuse Father   . Cancer Mother     lung  . Heart disease Mother   . Diabetes Sister   . Heart disease Sister     Medications: Patient's Medications  New Prescriptions   No medications on file  Previous Medications   ALBUTEROL (PROVENTIL HFA;VENTOLIN HFA) 108 (90 BASE) MCG/ACT INHALER    Inhale 1 puff into the lungs every 6 (six) hours as needed for wheezing or shortness of breath.   ASPIRIN 81 MG TABLET    Take 81 mg by mouth daily.   CARVEDILOL (COREG) 6.25 MG TABLET    Take 1 tablet (6.25 mg total) by mouth 2 (two) times daily with a meal.   CHOLECALCIFEROL (VITAMIN D) 2000 UNITS TABLET    Take 2,000 Units by mouth daily.   INSULIN NPH HUMAN (HUMULIN N,NOVOLIN N) 100 UNIT/ML INJECTION    Inject 27 units SQ in the am & inject 5-6 units SQ in the pm   LEVOTHYROXINE (SYNTHROID, LEVOTHROID) 137 MCG TABLET    TAKE ONE TABLET BY MOUTH ONCE DAILY   LISINOPRIL (PRINIVIL,ZESTRIL) 10 MG TABLET    Take 1 tablet (10 mg total) by mouth daily.   MULTIPLE VITAMIN (MULTI VITAMIN PO)    Take 1 tablet by mouth daily.   NITROGLYCERIN (NITROSTAT) 0.4 MG SL TABLET    Place 1 tablet (0.4 mg total) under the tongue every 5 (five) minutes as needed for chest pain.   OMEGA-3 FATTY ACIDS (OMEGA 3 PO)    Take 1 capsule by mouth daily.  Modified Medications   No medications on file  Discontinued Medications   No medications on file     Physical Exam:  Filed Vitals:   08/20/15 1043  BP: 146/70  Pulse: 65  Temp: 97.5 F (36.4 C)  TempSrc: Oral  Resp: 20  Height:  (1.549 m)  Weight: 142 lb 3.2 oz (64.501 kg)  SpO2: 96%   Body mass index is 26.88 kg/(m^2).  Physical Exam  Constitutional: She is oriented to person, place, and time. She appears well-developed and well-nourished. No distress.  HENT:  Head: Normocephalic and atraumatic.  Mouth/Throat: Oropharynx is clear and moist. No oropharyngeal exudate.  Eyes: Conjunctivae and EOM are normal. Pupils are equal, round, and  reactive to light.  Neck: Normal range of motion. Neck supple. No thyromegaly present.  Cardiovascular: Normal rate, regular rhythm and normal heart sounds.   Pulmonary/Chest: Effort normal and breath sounds normal.  Declines breast exam  Abdominal: Soft. Bowel sounds are normal. She exhibits no distension. There is no tenderness.  Musculoskeletal: Normal range of motion. She exhibits edema (1+ bilaterally). She exhibits no tenderness.  Lymphadenopathy:    She has no cervical adenopathy.  Neurological: She is alert and oriented to person, place, and time. She has normal reflexes.  Decreased sensation to bilateral feet with monofilament   Skin: Skin is warm and dry. She is not diaphoretic. No erythema.  Psychiatric: She has a normal mood and affect.    Labs reviewed: Basic  Metabolic Panel:  Recent Labs  16/10/96 1026 03/21/15 1648 04/15/15 0924  NA 142 130* 141  K 3.9 4.3 4.0  CL 100 90* 97  CO2 24 23 23   GLUCOSE 46* 391* 213*  BUN 9 12 12   CREATININE 0.61 0.69 0.56*  CALCIUM 9.5 9.1 9.5  TSH 2.230  --   --    Liver Function Tests:  Recent Labs  02/19/15  AST 34  ALT 28  ALKPHOS 122   No results for input(s): LIPASE, AMYLASE in the last 8760 hours. No results for input(s): AMMONIA in the last 8760 hours. CBC:  Recent Labs  02/05/15 0538  02/07/15 0650 02/08/15 0418 02/19/15 03/07/15 1026  WBC 13.8*  < > 8.8 8.6 9.6 5.6  NEUTROABS 12.1*  --   --   --   --  3.1  HGB 13.1  < > 11.3* 9.9* 10.5*  --   HCT 38.6  < > 32.7* 28.4* 32* 33.5*  MCV 97.7  < > 98.2 95.9  --  100*  PLT 202  < > 165 168 457* 332  < > = values in this interval not displayed. Lipid Panel:  Recent Labs  02/08/15 0418  CHOL 136  HDL 52  LDLCALC 70  TRIG 72  CHOLHDL 2.6   TSH:  Recent Labs  03/07/15 1026  TSH 2.230   A1C: Lab Results  Component Value Date   HGBA1C 8.0* 02/06/2015     Assessment/Plan 1. Encounter for Medicare annual wellness exam -The patient is doing  well and no new problems were identified on exam. Pt needs to follow up with ophthalmologist. Diet discussed -to increase activity during the day -will get dexa scan due to hx of fracture -orders for cologuard sent -will get fasting blood work scheduled since pt has eaten today.   2. Type 2 diabetes mellitus without complication, with long-term current use of insulin (HCC) -per pt with elevated A1c, working with endocrinology on insulin management with diet control -pt with decrease sensation bilaterally to feet, educated on checking feet daily, always wearing shoes -podiatry for nail/foot care -ophthalmology for eye exam -to send records of A1c from Dr Janus Molder office   3. Hyponatremia Will follow up lab - Comprehensive metabolic panel; Future  4. Anemia, unspecified anemia type -will follow up CBC with Differential; Future  5. Hypothyroidism, unspecified hypothyroidism type -following with endocrinology, request pt to have records sent from endocorinology  6. Hip fracture, right, closed, with routine healing, subsequent encounter -doing well post-op, no further pain noted - DG Bone Density; Future  7. Hyperlipidemia -off Statin, needing follow up on lipids, has not fasted today will make appt for blood work - Lipid panel; Future  8. Cerumen impaction, bilateral -ear lavage bilaterally, pt tolerated well  Follow up in 3 months with Dr Montez Morita for routine follow up.  Janene Harvey. Biagio Borg  [facility] [phone] 8 am - 5 pm) [phone] (after hours)

## 2015-08-20 NOTE — Patient Instructions (Addendum)
Make appt for fasting blood work  Need to make appt for podiatrist and ophthalmology for eye exam Make sure you are checking your feet daily  Have endocrinologist send [place] your records so we do not do duplicate blood work.   Follow up in 3 months with Dr Montez Morita for routine follow up

## 2015-08-21 ENCOUNTER — Telehealth: Payer: Self-pay | Admitting: *Deleted

## 2015-08-21 NOTE — Telephone Encounter (Signed)
Spoke with patient regarding cologuard packet, I informed her that I am sending her a welcome book in the mail.

## 2015-09-05 ENCOUNTER — Telehealth: Payer: Self-pay | Admitting: Nurse Practitioner

## 2015-09-05 NOTE — Telephone Encounter (Signed)
Patient called into the office requesting an appointment for possible shingles. Patient insisted on seeing Shanda BumpsJessica, explained to patient that Shanda BumpsJessica was out on maternity leave and that I had an opening with Dr. Chilton SiGreen 09/11/15 @ 12:15 she stated she could only come in the afternoons after 3:30. Dr. Renato Gailseed & Dr. Celene Skeenarter's schedules where full. Explained to the patient that I did not have any other openings at this time and offered to let her speak to triage. She said "No I'll just let it go for now. I can't come in for another appointment."

## 2015-09-06 ENCOUNTER — Ambulatory Visit (INDEPENDENT_AMBULATORY_CARE_PROVIDER_SITE_OTHER): Payer: Medicare Other | Admitting: Family Medicine

## 2015-09-06 ENCOUNTER — Ambulatory Visit (INDEPENDENT_AMBULATORY_CARE_PROVIDER_SITE_OTHER): Payer: Medicare Other

## 2015-09-06 VITALS — BP 118/72 | HR 69 | Temp 97.4°F | Resp 16 | Ht 61.0 in | Wt 144.0 lb

## 2015-09-06 DIAGNOSIS — F17200 Nicotine dependence, unspecified, uncomplicated: Secondary | ICD-10-CM

## 2015-09-06 DIAGNOSIS — B029 Zoster without complications: Secondary | ICD-10-CM | POA: Diagnosis not present

## 2015-09-06 DIAGNOSIS — S20411A Abrasion of right back wall of thorax, initial encounter: Secondary | ICD-10-CM | POA: Diagnosis not present

## 2015-09-06 LAB — POCT CBC
GRANULOCYTE PERCENT: 67.3 % (ref 37–80)
HEMATOCRIT: 35.7 % — AB (ref 37.7–47.9)
HEMOGLOBIN: 12.8 g/dL (ref 12.2–16.2)
LYMPH, POC: 1.4 (ref 0.6–3.4)
MCH, POC: 33 pg — AB (ref 27–31.2)
MCHC: 35.8 g/dL — AB (ref 31.8–35.4)
MCV: 92 fL (ref 80–97)
MID (CBC): 0.7 (ref 0–0.9)
MPV: 8.3 fL (ref 0–99.8)
POC GRANULOCYTE: 4.4 (ref 2–6.9)
POC LYMPH %: 21.7 % (ref 10–50)
POC MID %: 11 %M (ref 0–12)
Platelet Count, POC: 192 10*3/uL (ref 142–424)
RBC: 3.87 M/uL — AB (ref 4.04–5.48)
RDW, POC: 13.4 %
WBC: 6.5 10*3/uL (ref 4.6–10.2)

## 2015-09-06 MED ORDER — CEPHALEXIN 500 MG PO CAPS: 500.0000 mg | ORAL_CAPSULE | Freq: Three times a day (TID) | ORAL | Status: DC

## 2015-09-06 MED ORDER — VALACYCLOVIR HCL 1 G PO TABS: 1000.0000 mg | ORAL_TABLET | Freq: Three times a day (TID) | ORAL | Status: DC

## 2015-09-06 NOTE — Progress Notes (Signed)
09/06/2015 1:16 PM   DOB: [birth year] / MRN: [id number]  SUBJECTIVE:  Tara Flores is a 80 y.o. female diabetic with a 60 pack year history presenting for painful rash. Reports this started 4 days ago and is worsening.  Associates pain about the rash.  Has an extensive history of smoking.  She has not had the shingles shot.   She has No Known Allergies.   She  has a past medical history of Hypertension; Diabetes mellitus without complication (HCC); Asthma; Hypothyroidism; Cardiac disease; Hyperlipidemia; CAD (coronary artery disease); Anxiety; Arthritis; and COPD (chronic obstructive pulmonary disease) (HCC).    She  reports that she has been smoking.  She has never used smokeless tobacco. She reports that she does not drink alcohol or use illicit drugs. She  has no sexual activity history on file. The patient  has past surgical history that includes Cardiac catheterization (N/A, 02/05/2015); Intramedullary (im) nail intertrochanteric (Right, 02/06/2015); Hip fracture surgery; Eye surgery (2006); and Joint replacement.  Her family history includes Alcohol abuse in her father; Cancer in her mother; Diabetes in her sister; Heart disease in her mother and sister.  Review of Systems  Constitutional: Negative for fever and chills.  Eyes: Negative for blurred vision and photophobia.  Respiratory: Negative for cough.   Cardiovascular: Negative for chest pain.  Genitourinary: Negative for dysuria.  Skin: Positive for itching and rash.  Neurological: Negative for dizziness and headaches.    Problem list and medications reviewed and updated by myself where necessary, and exist elsewhere in the encounter.   OBJECTIVE:  BP 118/72 mmHg  Pulse 69  Temp(Src) 97.4 F (36.3 C) (Oral)  Resp 16  Ht  (1.549 m)  Wt 144 lb (65.318 kg)  BMI 27.22 kg/m2  SpO2 96%  Physical Exam  Constitutional: She is oriented to person, place, and time.  Cardiovascular: Normal rate and regular rhythm.     Pulmonary/Chest: Effort normal and breath sounds normal.  Abdominal: Soft. Bowel sounds are normal.  Musculoskeletal: Normal range of motion.  Neurological: She is alert and oriented to person, place, and time. No cranial nerve deficit.  Vitals reviewed.         Lab Results  Component Value Date   HGBA1C 8.0* 02/06/2015   Results for orders placed or performed in visit on 09/06/15 (from the past 72 hour(s))  POCT CBC     Status: Abnormal   Collection Time: 09/06/15  1:07 PM  Result Value Ref Range   WBC 6.5 4.6 - 10.2 K/uL   Lymph, poc 1.4 0.6 - 3.4   POC LYMPH PERCENT 21.7 10 - 50 %L   MID (cbc) 0.7 0 - 0.9   POC MID % 11.0 0 - 12 %M   POC Granulocyte 4.4 2 - 6.9   Granulocyte percent 67.3 37 - 80 %G   RBC 3.87 (A) 4.04 - 5.48 M/uL   Hemoglobin 12.8 12.2 - 16.2 g/dL   HCT, POC 40.9 (A) 81.1 - 47.9 %   MCV 92.0 80 - 97 fL   MCH, POC 33.0 (A) 27 - 31.2 pg   MCHC 35.8 (A) 31.8 - 35.4 g/dL   RDW, POC 91.4 %   Platelet Count, POC 192 142 - 424 K/uL   MPV 8.3 0 - 99.8 fL   Lab Results  Component Value Date   CREATININE 0.56* 04/15/2015     Dg Chest 2 View  09/06/2015  CLINICAL DATA:  Extensive shingles rash.  Smoker. EXAM: CHEST  2 VIEW COMPARISON:  02/05/2015 FINDINGS: The cardiac silhouette remains mildly enlarged. There is prominent hyperinflation of the lungs. Curvilinear opacity in the left lung base is suggestive of scarring or subsegmental atelectasis. Pulmonary vascular congestion on the prior study has resolved. There is no current evidence of pulmonary edema, pleural effusion, or pneumothorax. A 5 mm nodule is again noted in the left lung base, likely calcified granuloma. No acute osseous abnormality is identified. IMPRESSION: COPD with left basilar scarring or atelectasis. Electronically Signed   By: Sebastian AcheAllen  Grady M.D.   On: 09/06/2015 13:04    ASSESSMENT AND PLAN  [name] was seen today for rash, sinus problem and other.  Diagnoses and all orders for this  visit:  Shingles: Consulted with Dr. Neva SeatGreene.  This is likely shingles with a superimposed bacterial infection given excoriations as well as some pustular lesions.  Cultures obtained.  Will cover for both shingles and cellulitis.  Will see her back on Sunday.   -     DG Chest 2 View; Future -     valACYclovir (VALTREX) 1000 MG tablet; Take 1 tablet (1,000 mg total) by mouth 3 (three) times daily.  Heavy smoker -     POCT CBC  Excoriation of back, right, initial encounter -     cephALEXin (KEFLEX) 500 MG capsule; Take 1 capsule (500 mg total) by mouth 3 (three) times daily. -     Herpes simplex virus culture -     Wound culture    The patient was advised to call or return to clinic if she does not see an improvement in symptoms or to seek the care of the closest emergency department if she worsens with the above plan.   Deliah BostonMichael Clark, MHS, PA-C [facility] 09/06/2015 1:16 PM

## 2015-09-08 ENCOUNTER — Ambulatory Visit (INDEPENDENT_AMBULATORY_CARE_PROVIDER_SITE_OTHER): Payer: Medicare Other | Admitting: Physician Assistant

## 2015-09-08 VITALS — BP 167/72 | HR 62 | Temp 97.9°F | Resp 24 | Ht 62.5 in | Wt 143.0 lb

## 2015-09-08 DIAGNOSIS — B029 Zoster without complications: Secondary | ICD-10-CM | POA: Diagnosis not present

## 2015-09-08 NOTE — Progress Notes (Signed)
09/08/2015 5:57 PM   DOB: 02/09/1936 / MRN: [id number]  SUBJECTIVE:  Tara Flores is a 80 y.o. female presenting for recheck of shingles diagnosed two days ago. She reports feeling better.  She continues to have some pain about the rash but states "it has dried up."   She is taking Valacyclovir and Keflex and is not missing doses.  She feels over all better, does not complain of fever, chills, and is eating a drinking.    She has No Known Allergies.   She  has a past medical history of Hypertension; Diabetes mellitus without complication (HCC); Asthma; Hypothyroidism; Cardiac disease; Hyperlipidemia; CAD (coronary artery disease); Anxiety; Arthritis; and COPD (chronic obstructive pulmonary disease) (HCC).    She  reports that she has been smoking.  She has never used smokeless tobacco. She reports that she does not drink alcohol or use illicit drugs. She  has no sexual activity history on file. The patient  has past surgical history that includes Cardiac catheterization (N/A, 02/05/2015); Intramedullary (im) nail intertrochanteric (Right, 02/06/2015); Hip fracture surgery; Eye surgery (2006); and Joint replacement.  Her family history includes Alcohol abuse in her father; Cancer in her mother; Diabetes in her sister; Heart disease in her mother and sister.  Review of Systems  Constitutional: Negative for fever and chills.  Eyes: Negative for blurred vision.  Respiratory: Negative for cough and shortness of breath.   Cardiovascular: Negative for chest pain.  Gastrointestinal: Negative for nausea and abdominal pain.  Genitourinary: Negative for dysuria, urgency and frequency.  Musculoskeletal: Negative for myalgias.  Skin: Positive for rash. Negative for itching.  Neurological: Negative for dizziness, tingling and headaches.  Psychiatric/Behavioral: Negative for depression. The patient is not nervous/anxious.     Problem list and medications reviewed and updated by myself where necessary,  and exist elsewhere in the encounter.   OBJECTIVE:  BP 167/72 mmHg  Pulse 62  Temp(Src) 97.9 F (36.6 C) (Oral)  Resp 24  Ht 5' 2.5" (1.588 m)  Wt 143 lb (64.864 kg)  BMI 25.72 kg/m2  SpO2 96% BP rechecked by me at 142/68.   BP Readings from Last 3 Encounters:  09/08/15 167/72  09/06/15 118/72  08/20/15 146/70    Physical Exam  Constitutional: She is oriented to person, place, and time. She appears well-nourished. No distress.  Eyes: EOM are normal. Pupils are equal, round, and reactive to light.  Cardiovascular: Normal rate and regular rhythm.   Pulmonary/Chest: Effort normal.  Abdominal: She exhibits no distension.  Neurological: She is alert and oriented to person, place, and time. No cranial nerve deficit. Gait normal.  Skin: Skin is dry. She is not diaphoretic.  Psychiatric: She has a normal mood and affect.  Vitals reviewed.   Results for orders placed or performed in visit on 09/06/15 (from the past 72 hour(s))  POCT CBC     Status: Abnormal   Collection Time: 09/06/15  1:07 PM  Result Value Ref Range   WBC 6.5 4.6 - 10.2 K/uL   Lymph, poc 1.4 0.6 - 3.4   POC LYMPH PERCENT 21.7 10 - 50 %L   MID (cbc) 0.7 0 - 0.9   POC MID % 11.0 0 - 12 %M   POC Granulocyte 4.4 2 - 6.9   Granulocyte percent 67.3 37 - 80 %G   RBC 3.87 (A) 4.04 - 5.48 M/uL   Hemoglobin 12.8 12.2 - 16.2 g/dL   HCT, POC 25.335.7 (A) 66.437.7 - 47.9 %   MCV 92.0  80 - 97 fL   MCH, POC 33.0 (A) 27 - 31.2 pg   MCHC 35.8 (A) 31.8 - 35.4 g/dL   RDW, POC 16.1 %   Platelet Count, POC 192 142 - 424 K/uL   MPV 8.3 0 - 99.8 fL  Herpes simplex virus culture     Status: None (Preliminary result)   Collection Time: 09/06/15  1:18 PM  Result Value Ref Range   Preliminary Report Culture has been initiated.   Wound culture     Status: None (Preliminary result)   Collection Time: 09/06/15  1:18 PM  Result Value Ref Range   Gram Stain No WBC Seen    Gram Stain No Squamous Epithelial Cells Seen    Gram Stain No  Organisms Seen    Preliminary Report Culture reincubated for better growth       No results found.  ASSESSMENT AND PLAN  Tara Flores was seen today for follow-up.  Diagnoses and all orders for this visit:  Shingles rash: The rash appears to be entering a healing phase.  Will continue her current medication plan.     The patient was advised to call or return to clinic if she does not see an improvement in symptoms or to seek the care of the closest emergency department if she worsens with the above plan.   Deliah Boston, MHS, PA-C [facility] 09/08/2015 5:57 PM

## 2015-09-08 NOTE — Progress Notes (Signed)
Patient discussed and examined with Mr. Chestine SporeClark. Agree with assessment and plan of care per his note. May be zoster with secondary infection versus other localized rash. Agree with both wound culture and viral/HSV culture, starting on Valtrex now, and cover for secondary cellulitis as below. RTC precautions and close follow up.

## 2015-09-09 LAB — HERPES SIMPLEX VIRUS CULTURE: ORGANISM ID, BACTERIA: NOT DETECTED

## 2015-09-09 LAB — WOUND CULTURE
GRAM STAIN: NONE SEEN
GRAM STAIN: NONE SEEN
Gram Stain: NONE SEEN

## 2015-09-11 ENCOUNTER — Other Ambulatory Visit: Payer: Medicare Other

## 2015-09-17 ENCOUNTER — Ambulatory Visit (INDEPENDENT_AMBULATORY_CARE_PROVIDER_SITE_OTHER): Payer: Medicare Other | Admitting: Family Medicine

## 2015-09-17 VITALS — BP 171/82 | HR 62 | Temp 98.2°F | Resp 24 | Wt 140.0 lb

## 2015-09-17 DIAGNOSIS — J441 Chronic obstructive pulmonary disease with (acute) exacerbation: Secondary | ICD-10-CM | POA: Diagnosis not present

## 2015-09-17 DIAGNOSIS — R062 Wheezing: Secondary | ICD-10-CM

## 2015-09-17 MED ORDER — ALBUTEROL SULFATE (2.5 MG/3ML) 0.083% IN NEBU
2.5000 mg | INHALATION_SOLUTION | Freq: Once | RESPIRATORY_TRACT | Status: AC
  Administered 2015-09-17: 2.5 mg via RESPIRATORY_TRACT

## 2015-09-17 MED ORDER — IPRATROPIUM BROMIDE 0.02 % IN SOLN
0.5000 mg | Freq: Once | RESPIRATORY_TRACT | Status: AC
  Administered 2015-09-17: 0.5 mg via RESPIRATORY_TRACT

## 2015-09-17 MED ORDER — METHYLPREDNISOLONE ACETATE 80 MG/ML IJ SUSP
40.0000 mg | Freq: Once | INTRAMUSCULAR | Status: AC
  Administered 2015-09-17: 40 mg via INTRAMUSCULAR

## 2015-09-17 NOTE — Patient Instructions (Signed)
Do your best to reduce your cigarette use. No more Keflex.  Please return if your breathing is not improving over the next 24-48 hours.  Continue to monitor your sugar to make sure it doesn't get too high. If it does come on in and will help regulate that.

## 2015-09-17 NOTE — Progress Notes (Signed)
This 80 year old smoker who has shortness of breath and wheezing. She recently was treated for shingles on her right shoulder and this is largely dried up.  She explained to me that she is been has problems with Keflex and that it makes her nauseated and she has no appetite. Without recent she's reduced her insulin and is taking only intermittently.  She's been having shortness of breath lately. She's wheezing and has dyspnea on exertion.  From prior visit: She  reports that she has been smoking. She has never used smokeless tobacco. She reports that she does not drink alcohol or use illicit drugs. She  has no sexual activity history on file. The patient  has past surgical history that includes Cardiac catheterization (N/A, 02/05/2015); Intramedullary (im) nail intertrochanteric (Right, 02/06/2015); Hip fracture surgery; Eye surgery (2006); and Joint replacement. Her family history includes Alcohol abuse in her father; Cancer in her mother; Diabetes in her sister; Heart disease in her mother and sister.  Objective:  Dyspneic at rest.  Given inhaler BP 171/82 mmHg  Pulse 62  Temp(Src) 98.2 F (36.8 C)  Resp 24  Wt 140 lb (63.504 kg)  SpO2 91% HEENT:  No acute abnormality Chest:  Bilateral inspiratory and expiratory wheezes Heart:  Regular without murmur Skin: dried shingles rash C8 distribution.  Patient has some relief of her wheezing after breathing treatment.  Assessment: COPD with acute exacerbation and adverse reaction to Keflex.  Plan:      ICD-9-CM ICD-10-CM   1. Wheezing 786.07 R06.2 albuterol (PROVENTIL) (2.5 MG/3ML) 0.083% nebulizer solution 2.5 mg     ipratropium (ATROVENT) nebulizer solution 0.5 mg  2. COPD exacerbation [facility](HCC) 491.21 J44.1      Signed, Elvina SidleKurt Abdurahman Rugg, MD

## 2015-09-19 ENCOUNTER — Other Ambulatory Visit: Payer: Self-pay | Admitting: Nurse Practitioner

## 2015-09-23 ENCOUNTER — Telehealth: Payer: Self-pay

## 2015-09-23 NOTE — Telephone Encounter (Signed)
PATIENT'S SON STATES HIS MOTHER HAS BEEN SEEN TWICE FOR SHINGLES. THE LAST TIME SHE WAS SEEN BY MICHAEL CLARK AND HE SAID HE WOULD CALL HER SOMETHING IN FOR PAIN. HE HAS NOT DONE THAT YET. HE WOULD LIKE TO GET HER SOMETHING CALLED INTO HER PHARMACY AS SOON AS POSSIBLE PLEASE. BEST PHONE [phone] (SON'S NAME IS BILL HAWKINS)  PHARMACY CHOICE IS [facility] ON [address].  MBC

## 2015-09-24 ENCOUNTER — Telehealth: Payer: Self-pay

## 2015-09-24 ENCOUNTER — Telehealth: Payer: Self-pay | Admitting: Nurse Practitioner

## 2015-09-24 ENCOUNTER — Inpatient Hospital Stay: Admission: RE | Admit: 2015-09-24 | Payer: Medicare Other | Source: Ambulatory Visit

## 2015-09-24 MED ORDER — TRAMADOL HCL 50 MG PO TABS: 50.0000 mg | ORAL_TABLET | Freq: Three times a day (TID) | ORAL | Status: DC | PRN

## 2015-09-24 NOTE — Telephone Encounter (Signed)
Spoke with pt's son. Advised Rx sent in. Can she have a breathing machine sent to her house with the medication?

## 2015-09-24 NOTE — Telephone Encounter (Signed)
Rx called in 

## 2015-09-24 NOTE — Telephone Encounter (Signed)
Patients son is calling because the patient states that she's having trouble breathing and would like a breathing machine. Patient stated that the in hailer isn't working.  [phone]

## 2015-09-24 NOTE — Telephone Encounter (Signed)
Called patient to follow up Bone Density appointment that she did not keep today, patient states she does plan on going but her son was supposed to take her today and couldn't. She is going to have her son call and reschedule appointment. I also asked the patient if she was going to continue to come to our practice because I saw in the system [facility] has [facility] - Patient stated she went to them because Shanda BumpsJessica is out on maternity leave and she has the shingles. Stated she does plan to continue to come here but is [facility] her [facility]les due to Shanda BumpsJessica being out.

## 2015-09-24 NOTE — Telephone Encounter (Signed)
I have given her a few days worth but if she needs more she will need to be seen again

## 2015-09-25 ENCOUNTER — Ambulatory Visit (INDEPENDENT_AMBULATORY_CARE_PROVIDER_SITE_OTHER): Payer: Medicare Other | Admitting: Family Medicine

## 2015-09-25 VITALS — BP 170/94 | HR 70 | Temp 97.7°F | Resp 18 | Ht 62.5 in | Wt 135.0 lb

## 2015-09-25 DIAGNOSIS — M25551 Pain in right hip: Secondary | ICD-10-CM

## 2015-09-25 DIAGNOSIS — J441 Chronic obstructive pulmonary disease with (acute) exacerbation: Secondary | ICD-10-CM | POA: Diagnosis not present

## 2015-09-25 DIAGNOSIS — B029 Zoster without complications: Secondary | ICD-10-CM

## 2015-09-25 MED ORDER — ALBUTEROL SULFATE (2.5 MG/3ML) 0.083% IN NEBU: 2.5000 mg | INHALATION_SOLUTION | Freq: Once | RESPIRATORY_TRACT | Status: DC

## 2015-09-25 MED ORDER — IPRATROPIUM BROMIDE 0.02 % IN SOLN: 0.5000 mg | Freq: Once | RESPIRATORY_TRACT | Status: DC

## 2015-09-25 MED ORDER — ALBUTEROL SULFATE HFA 108 (90 BASE) MCG/ACT IN AERS: 1.0000 | INHALATION_SPRAY | Freq: Four times a day (QID) | RESPIRATORY_TRACT | Status: DC | PRN

## 2015-09-25 MED ORDER — TRAMADOL HCL 50 MG PO TABS: 50.0000 mg | ORAL_TABLET | Freq: Three times a day (TID) | ORAL | Status: DC | PRN

## 2015-09-25 MED ORDER — ALBUTEROL SULFATE (2.5 MG/3ML) 0.083% IN NEBU: 2.5000 mg | INHALATION_SOLUTION | Freq: Four times a day (QID) | RESPIRATORY_TRACT | Status: DC | PRN

## 2015-09-25 NOTE — Telephone Encounter (Signed)
I approve a nebulizaer machine with albuterol nebules, 2.5 mg in 3 ml, taken every 6 hours.  Recheck tomorrow if not improving

## 2015-09-25 NOTE — Patient Instructions (Addendum)
Let us know if you're not feeling better in the next 24 hours.

## 2015-09-25 NOTE — Progress Notes (Signed)
Patient ID: Tara Flores MRN: [id number], DOB: 07/12/1935, 80 y.o. Date of Encounter: 09/25/2015, 1:31 PM  Primary Physician: Sharon SellerEUBANKS, JESSICA K, NP  Chief Complaint:  Chief Complaint  Patient presents with  . Shortness of Breath    on going, wants to know if she can have a machine ordered for her home    HPI: 80 y.o. year old female presents with a 30 day history of nasal congestion, post nasal drip, sore throat, and cough. Mild sinus pressure. Afebrile. No chills. Nasal congestion thick and green/yellow. Minimal cough. Ears feel full, leading to sensation of muffled hearing. Has tried OTC cold preps without success. No GI complaints. Overusing the inhaler as it is not working  The shingles rash on her shoulders is almost gone.  No sick contacts, recent antibiotics, or recent travels.   No leg trauma, sedentary periods, h/o cancer, or tobacco use.  Past Medical History  Diagnosis Date  . Hypertension   . Diabetes mellitus without complication (HCC)   . Asthma   . Hypothyroidism   . Cardiac disease   . Hyperlipidemia   . CAD (coronary artery disease)   . Anxiety   . Arthritis   . COPD (chronic obstructive pulmonary disease) (HCC)      Home Meds: Prior to Admission medications   Medication Sig Start Date End Date Taking? Authorizing Provider  albuterol (PROVENTIL HFA;VENTOLIN HFA) 108 (90 BASE) MCG/ACT inhaler Inhale 1 puff into the lungs every 6 (six) hours as needed for wheezing or shortness of breath. Reported on 09/06/2015   Yes Historical Provider, MD  aspirin 81 MG tablet Take 81 mg by mouth daily.   Yes Historical Provider, MD  carvedilol (COREG) 6.25 MG tablet TAKE ONE TABLET BY MOUTH TWICE DAILY WITH MEALS 09/19/15  Yes Sharon SellerJessica K Eubanks, NP  Cholecalciferol (VITAMIN D) 2000 units tablet Take 2,000 Units by mouth daily.   Yes Historical Provider, MD  insulin NPH Human (HUMULIN N,NOVOLIN N) 100 UNIT/ML injection Inject 27 units SQ in the am & inject 5-6 units SQ in  the pm   Yes Historical Provider, MD  levothyroxine (SYNTHROID, LEVOTHROID) 137 MCG tablet TAKE ONE TABLET BY MOUTH ONCE DAILY 07/05/15  Yes Sharon SellerJessica K Eubanks, NP  lisinopril (PRINIVIL,ZESTRIL) 10 MG tablet Take 1 tablet (10 mg total) by mouth daily. 03/21/15  Yes Wendall StadePeter C Nishan, MD  Multiple Vitamin (MULTI VITAMIN PO) Take 1 tablet by mouth daily. Reported on 09/06/2015   Yes Historical Provider, MD  nitroGLYCERIN (NITROSTAT) 0.4 MG SL tablet Place 1 tablet (0.4 mg total) under the tongue every 5 (five) minutes as needed for chest pain. 07/18/15  Yes Wendall StadePeter C Nishan, MD  Omega-3 Fatty Acids (OMEGA 3 PO) Take 1 capsule by mouth daily. Reported on 09/06/2015   Yes Historical Provider, MD  traMADol (ULTRAM) 50 MG tablet Take 1 tablet (50 mg total) by mouth every 8 (eight) hours as needed. 09/24/15  Yes Morrell RiddleSarah L Weber, PA-C  valACYclovir (VALTREX) 1000 MG tablet Take 1 tablet (1,000 mg total) by mouth 3 (three) times daily. 09/06/15  Yes Ofilia NeasMichael L Clark, PA-C    Allergies:  Allergies  Allergen Reactions  . Keflex [Cephalexin] Nausea Only    Loss of appetite    Social History   Social History  . Marital Status: Widowed    Spouse Name: N/A  . Number of Children: N/A  . Years of Education: N/A   Occupational History  . Not on file.   Social History Main  Topics  . Smoking status: Current Every Day Smoker -- 1.00 packs/day for 60 years  . Smokeless tobacco: Never Used     Comment: has recently cut back, conts to cut back but repors she has a lot of excuses   . Alcohol Use: No  . Drug Use: No  . Sexual Activity: Not on file   Other Topics Concern  . Not on file   Social History Narrative   Diet:      Do you drink/ eat things with caffeine? yes      Marital status:  widowed                             What year were you married ? 1969      Do you live in a house, apartment,assistred living, condo, trailer, etc.)?trailer      Is it one or more stories? no      How many persons live in  your home ? none      Do you have any pets in your home ?(please list) 1 dog      Current or past profession: LPN      Do you exercise?  yes                            Type & how often: with PT      Do you have a living will? no      Do you have a DNR form? no                      If not, do you want to discuss one? yes      Do you have signed POA?HPOA forms?   no              If so, please bring to your        appointment           Review of Systems: Constitutional: negative for chills, fever, night sweats or weight changes Cardiovascular: negative for chest pain or palpitations Respiratory: negative for hemoptysis, wheezing, or shortness of breath Abdominal: negative for abdominal pain, nausea, vomiting or diarrhea Dermatological: negative for rash Neurologic: negative for headache   Physical Exam: Blood pressure 170/94, pulse 70, temperature 97.7 F (36.5 C), temperature source Oral, resp. rate 18, height 5' 2.5" (1.588 m), weight 135 lb (61.236 kg), SpO2 96 %., Body mass index is 24.28 kg/(m^2). General: Well developed, well nourished, in no acute distress. Head: Normocephalic, atraumatic, eyes without discharge, sclera non-icteric, nares are congested. Bilateral auditory canals clear, TM's are without perforation, pearly grey with reflective cone of light bilaterally. No sinus TTP. Oral cavity moist, dentition normal. Posterior pharynx with post nasal drip and mild erythema. No peritonsillar abscess or tonsillar exudate. Neck: Supple. No thyromegaly. Full ROM. No lymphadenopathy. Lungs: Dyspneic at rest.  Inspiratory wheezes and expiratory wheezes. Heart: RRR with S1 S2. No murmurs, rubs, or gallops appreciated. Msk:  Strength and tone normal for age. Extremities: No clubbing or cyanosis. No edema. Neuro: Alert and oriented X 3. Moves all extremities spontaneously. CNII-XII grossly in tact. Psych:  Responds to questions appropriately with a normal affect.    ASSESSMENT  AND PLAN:  80 y.o. year old female with bronchitis. -   ICD-9-CM ICD-10-CM   1. COPD exacerbation (HCC) 491.21 J44.1 albuterol (PROVENTIL) (2.5 MG/3ML) 0.083% nebulizer solution  2.5 mg     ipratropium (ATROVENT) nebulizer solution 0.5 mg     albuterol (PROVENTIL) (2.5 MG/3ML) 0.083% nebulizer solution     albuterol (PROVENTIL HFA;VENTOLIN HFA) 108 (90 Base) MCG/ACT inhaler  2. Hip pain, right 719.45 M25.551 traMADol (ULTRAM) 50 MG tablet  3. Shingles 053.9 B02.9 traMADol (ULTRAM) 50 MG tablet   -Tylenol/Motrin prn -Rest/fluids -RTC precautions -RTC 3-5 days if no improvement  Signed, Elvina Sidle, MD 09/25/2015 1:31 PM

## 2015-09-25 NOTE — Telephone Encounter (Signed)
Pt is here

## 2015-09-25 NOTE — Telephone Encounter (Signed)
Pt came into [address] and saw Dr L on 4/5. He ordered a nebulizer during OV.

## 2015-10-17 ENCOUNTER — Encounter: Payer: Self-pay | Admitting: Internal Medicine

## 2015-10-17 ENCOUNTER — Ambulatory Visit (INDEPENDENT_AMBULATORY_CARE_PROVIDER_SITE_OTHER): Payer: Medicare Other | Admitting: Internal Medicine

## 2015-10-17 VITALS — BP 140/70 | HR 65 | Temp 98.0°F | Wt 134.0 lb

## 2015-10-17 DIAGNOSIS — H3530 Unspecified macular degeneration: Secondary | ICD-10-CM | POA: Diagnosis not present

## 2015-10-17 DIAGNOSIS — E11319 Type 2 diabetes mellitus with unspecified diabetic retinopathy without macular edema: Secondary | ICD-10-CM

## 2015-10-17 DIAGNOSIS — H548 Legal blindness, as defined in USA: Secondary | ICD-10-CM

## 2015-10-17 NOTE — Progress Notes (Signed)
Patient ID: Tara Flores, female   DOB: [birth year], 80 y.o.   MRN: [id number]   Location:  [facility] clinic Provider: Leviticus Harton L. Renato Gails, D.O., C.M.D.  Goals of Care:  Advanced Directives 10/17/2015  Does patient have an advance directive? No  Copy of advanced directive(s) in chart? -  Would patient like information on creating an advanced directive? -   Chief Complaint  Patient presents with  . Acute Visit    vision changes, asking for home health referral    HPI: Patient is a 80 y.o. female seen today for an acute visit for vision changes.  Needs help with drawing up insulin.  Has not been to ophtho in over a year.  Had seen Dr. Ashley Royalty last year.  Had several other medical problems.  Also needs some help with other things.  Uses magnifying glass and it doesn't help.  Her grandson drove her here today.  Feels like there's a heavy fog in the room and cannot clearly see my face just 3-4 feet away.    Had ulcers on legs, hip fracture, shingles on her right neck with outrageous pain.  All of these led to delays in her attempts to get back into ophtho.  She then got cellulitis on top of her shingles.  She got sick from keflex.  Then back to ED.  Was put on prednisone and a "nonhabitforming" pain med.  Was put on neb treatments for her lungs.  Is back to using her walker more.    Past Medical History  Diagnosis Date  . Hypertension   . Diabetes mellitus without complication (HCC)   . Asthma   . Hypothyroidism   . Cardiac disease   . Hyperlipidemia   . CAD (coronary artery disease)   . Anxiety   . Arthritis   . COPD (chronic obstructive pulmonary disease) [facility])     Past Surgical History  Procedure Laterality Date  . Cardiac catheterization N/A 02/05/2015    Procedure: Left Heart Cath and Coronary Angiography;  Surgeon: Kathleene Hazel, MD;  Location: [facility] INVASIVE CV LAB;  Service: Cardiovascular;  Laterality: N/A;  . Intramedullary (im) nail intertrochanteric Right 02/06/2015   Procedure: INTRAMEDULLARY (IM) NAIL RIGHT HIP;  Surgeon: Tarry Kos, MD;  Location: [facility] OR;  Service: Orthopedics;  Laterality: Right;  . Hip fracture surgery    . Eye surgery  2006    unsure if exact procedure  . Joint replacement      Allergies  Allergen Reactions  . Keflex [Cephalexin] Nausea Only    Loss of appetite      Medication List       This list is accurate as of: 10/17/15  3:29 PM.  Always use your most recent med list.               albuterol 108 (90 Base) MCG/ACT inhaler  Commonly known as:  PROVENTIL HFA;VENTOLIN HFA  Inhale 1 puff into the lungs every 6 (six) hours as needed for wheezing or shortness of breath. Reported on 09/06/2015     aspirin 81 MG tablet  Take 81 mg by mouth daily.     carvedilol 6.25 MG tablet  Commonly known as:  COREG  TAKE ONE TABLET BY MOUTH TWICE DAILY WITH MEALS     insulin NPH Human 100 UNIT/ML injection  Commonly known as:  HUMULIN N,NOVOLIN N  Inject 27 units SQ in the am & inject 5-6 units SQ in the pm     levothyroxine 137  MCG tablet  Commonly known as:  SYNTHROID, LEVOTHROID  TAKE ONE TABLET BY MOUTH ONCE DAILY     lisinopril 10 MG tablet  Commonly known as:  PRINIVIL,ZESTRIL  Take 1 tablet (10 mg total) by mouth daily.     MULTI VITAMIN PO  Take 1 tablet by mouth daily. Reported on 09/06/2015     nitroGLYCERIN 0.4 MG SL tablet  Commonly known as:  NITROSTAT  Place 1 tablet (0.4 mg total) under the tongue every 5 (five) minutes as needed for chest pain.     OMEGA 3 PO  Take 1 capsule by mouth daily. Reported on 09/06/2015     traMADol 50 MG tablet  Commonly known as:  ULTRAM  Take 1 tablet (50 mg total) by mouth every 8 (eight) hours as needed.     Vitamin D 2000 units tablet  Take 2,000 Units by mouth daily.        Review of Systems:  Review of Systems  Constitutional: Negative for fever and chills.  HENT: Negative for congestion.   Eyes: Positive for blurred vision. Negative for pain, discharge and  redness.       Blind, uses walking stick  Respiratory: Positive for cough, sputum production and wheezing. Negative for shortness of breath.        Smokes  Cardiovascular: Negative for chest pain, palpitations and leg swelling.  Musculoskeletal: Positive for joint pain.  Skin:       Recent shingles right shoulder  Neurological: Positive for tingling and sensory change.       Right shoulder  Psychiatric/Behavioral: Negative for memory loss.    Health Maintenance  Topic Date Due  . OPHTHALMOLOGY EXAM  08/26/1947  . ZOSTAVAX  08/25/1997  . DEXA SCAN  08/26/2002  . HEMOGLOBIN A1C  08/09/2015  . INFLUENZA VACCINE  03/06/2016 (Originally 01/21/2016)  . PNA vac Low Risk Adult (1 of 2 - PCV13) 03/06/2016 (Originally 08/26/2002)  . FOOT EXAM  08/19/2016  . TETANUS/TDAP  08/18/2024    Physical Exam: Filed Vitals:   10/17/15 1506  BP: 140/70  Pulse: 65  Temp: 98 F (36.7 C)  TempSrc: Oral  Weight: 134 lb (60.782 kg)  SpO2: 97%   Body mass index is 24.1 kg/(m^2). Physical Exam  Constitutional: No distress.  Eyes:  Unable to see my face to recognize it three feet away  Cardiovascular: Normal rate, regular rhythm, normal heart sounds and intact distal pulses.   Pulmonary/Chest: She has wheezes.  Abdominal: Soft. Bowel sounds are normal. She exhibits no distension. There is no tenderness.  Musculoskeletal: Normal range of motion.  Uses walking stick for support/visual aide  Skin: Skin is warm and dry.  Ashy skin tone  Psychiatric:  anxious    Labs reviewed: Basic Metabolic Panel:  Recent Labs  40/98/1109/15/16 1026 03/21/15 1648 04/15/15 0924  NA 142 130* 141  K 3.9 4.3 4.0  CL 100 90* 97  CO2 24 23 23   GLUCOSE 46* 391* 213*  BUN 9 12 12   CREATININE 0.61 0.69 0.56*  CALCIUM 9.5 9.1 9.5  TSH 2.230  --   --    Liver Function Tests:  Recent Labs  02/19/15  AST 34  ALT 28  ALKPHOS 122   No results for input(s): LIPASE, AMYLASE in the last 8760 hours. No results for  input(s): AMMONIA in the last 8760 hours. CBC:  Recent Labs  02/05/15 0538  02/08/15 0418 02/19/15 03/07/15 1026 09/06/15 1307  WBC 13.8*  < > 8.6 9.6  5.6 6.5  NEUTROABS 12.1*  --   --   --  3.1  --   HGB 13.1  < > 9.9* 10.5*  --  12.8  HCT 38.6  < > 28.4* 32* 33.5* 35.7*  MCV 97.7  < > 95.9  --  100* 92.0  PLT 202  < > 168 457* 332  --   < > = values in this interval not displayed. Lipid Panel:  Recent Labs  02/08/15 0418  CHOL 136  HDL 52  LDLCALC 70  TRIG 72  CHOLHDL 2.6   Lab Results  Component Value Date   HGBA1C 8.0* 02/06/2015    Assessment/Plan 1. Legal blindness - pt with worsening vision over time -having difficulty administering her insulin especially at this point, but also needs help with her home chores (aide services) - would also benefit from home safety eval and some PT - Ambulatory referral to Home Health - Hemoglobin A1c - Basic metabolic panel  2. Diabetic retinopathy associated with type 2 diabetes mellitus, macular edema presence unspecified, unspecified retinopathy severity (HCC) - see above, hba1c has not been adequately controlled and suspect it's worse now when she's having difficulty giving herself insulin - Ambulatory referral to Home Health - Hemoglobin A1c - Basic metabolic panel  3. Macular degeneration - seems this is the portion that has worsened(see hpi), reports her retinopathy is stable - Ambulatory referral to Home Health -also advised to follow up as directed with Dr. Ashley Royalty (appt got rescheduled far away after she could not go due to her shingles)  Labs/tests ordered:   Orders Placed This Encounter  Procedures  . Hemoglobin A1c  . Basic metabolic panel  . Ambulatory referral to Home Health    Referral Priority:  Routine    Referral Type:  Home Health Care    Referral Reason:  Specialty Services Required    Requested Specialty:  Home Health Services    Number of Visits Requested:  1    Next appt:  01/31/2016 keep  next regular visit with Shanda Bumps  Virgilia Quigg L. Jeselle Hiser, D.O. Geriatrics [facility] [address], [place] [address] Cell Phone (Mon-Fri 8am-5pm):  [phone] On Call:  [phone] & follow prompts after 5pm & weekends Office Phone:  [phone] Office Fax:  [phone]

## 2015-10-18 ENCOUNTER — Encounter: Payer: Self-pay | Admitting: *Deleted

## 2015-10-18 LAB — BASIC METABOLIC PANEL
BUN/Creatinine Ratio: 29 — ABNORMAL HIGH (ref 12–28)
BUN: 16 mg/dL (ref 8–27)
CO2: 25 mmol/L (ref 18–29)
Calcium: 9.3 mg/dL (ref 8.7–10.3)
Chloride: 97 mmol/L (ref 96–106)
Creatinine, Ser: 0.56 mg/dL — ABNORMAL LOW (ref 0.57–1.00)
GFR calc Af Amer: 103 mL/min/{1.73_m2} (ref >59)
GFR calc non Af Amer: 90 mL/min/{1.73_m2} (ref >59)
Glucose: 103 mg/dL — ABNORMAL HIGH (ref 65–99)
Potassium: 4.6 mmol/L (ref 3.5–5.2)
Sodium: 139 mmol/L (ref 134–144)

## 2015-10-18 LAB — HEMOGLOBIN A1C
Est. average glucose Bld gHb Est-mCnc: 209 mg/dL
Hgb A1c MFr Bld: 8.9 % — ABNORMAL HIGH (ref 4.8–5.6)

## 2015-10-21 ENCOUNTER — Telehealth: Payer: Self-pay

## 2015-10-21 ENCOUNTER — Telehealth: Payer: Self-pay | Admitting: Internal Medicine

## 2015-10-21 NOTE — Telephone Encounter (Signed)
Spoke with patient and advised results, also advised letter mailed to home address.

## 2015-10-21 NOTE — Telephone Encounter (Signed)
Ok thanks.  She had requested someone to help her with insulin administration (that would be a nurse) b/c she's blind which is what I put in the referral.  Someone had written down [facility] on a piece of paper for her so that's the one I ordered.  An aide to do laundry and things like that probably will not be covered under any agency--this is usually out of pocket.  [facility] help her with this b/c there might be special exceptions due to her being blind.

## 2015-10-21 NOTE — Telephone Encounter (Signed)
Lorain ChildesFYI, Per Karna [facility]rianna at [facility] refused services. She stated patient wants an aid to be with her for 3 hours a day.

## 2015-10-21 NOTE — Telephone Encounter (Signed)
Patient called wanted to know what her A 1c was. Routing to [place] as per [place] she said she would take care of this.

## 2015-10-21 NOTE — Telephone Encounter (Signed)
Do they not have aide services?  If not, can they make a referral to aide services?

## 2015-10-21 NOTE — Telephone Encounter (Signed)
I called patient to follow up on this referral to find out why she refused services. Patient stated she refused services because it would not be covered by her insurance. She said she was going to call her insurance company and find out who would be covered and she will call us back. According to the patient she wants someone permanently who can help her for about 3 hours everyday with everything and this is not something that Home Health provides. She said she will call us back.

## 2015-10-22 NOTE — Telephone Encounter (Signed)
I am not sure, but we could try it. A referral would need to be entered in the system

## 2015-11-25 ENCOUNTER — Telehealth: Payer: Self-pay

## 2015-11-25 NOTE — Telephone Encounter (Signed)
Patient had lab work (CMP, CBC with DIFF, FLP) ordered on 08/20/15 that has yet to be collected. I called patient to see if she could schedule a lab appointment to get these labs done. I left a message on voicemail for patient to call the office.

## 2015-11-26 NOTE — Telephone Encounter (Signed)
Called patient again this morning but still was unable to reach her. Will try again later today.

## 2015-11-28 NOTE — Telephone Encounter (Signed)
Left message for patient to call the office about scheduling an appointment to have these labs collected.

## 2015-12-02 ENCOUNTER — Other Ambulatory Visit: Payer: Self-pay | Admitting: Nurse Practitioner

## 2015-12-02 NOTE — Telephone Encounter (Signed)
Spoke with patient to explain that a new cologuard test kit was going to be sent. She stated that she understood but that she wanted the number to the company that sends the test to find out if they were going to charge her for the retest.   She also asked if Janett Billow knew of any home health agency that could come in the home with her for about 10 hours a day. She has been unable to find one on her own but she is afraid of being by herself due to blindness.

## 2015-12-02 NOTE — Telephone Encounter (Signed)
Shanda BumpsJessica gave two business cards for home health care and personal care that the patient could contact to find out about getting help.   [facility] [phone]  [facility] [phone]  Patient stated that she would call both of these agencies to see if they could help.

## 2015-12-02 NOTE — Telephone Encounter (Signed)
I contacted [employer] at [phone] to find out why patient's results were not sent to the office. The representative stated that the package was left on patient's porch on August 23, 2015. After telling representative that patient was certain that the package was sent back, representative tracked the package to find out that it had been damaged on the return trip to the company. Representative stated that another cologuard package would be sent to patient and that patient would not be charged for either test kit until the company was actually able to obtain a result.   Attempted to call patient, but was unable to reach her or leave a message. Will try again later.

## 2015-12-02 NOTE — Telephone Encounter (Signed)
Please note pt needs labs with next appt, and contact cologuard company for results

## 2015-12-02 NOTE — Telephone Encounter (Signed)
Home health will not stay for 10 hours but a personal care service would.

## 2015-12-02 NOTE — Telephone Encounter (Signed)
I spoke with patient and she stated that she could not afford to get any labs at this time. Patient also states that a Cologuard test was order by Shanda BumpsJessica and she wants the results. I could not find where the test was ordered or where results were received.   Please advise.

## 2015-12-06 ENCOUNTER — Encounter: Payer: Self-pay | Admitting: *Deleted

## 2015-12-06 ENCOUNTER — Ambulatory Visit (INDEPENDENT_AMBULATORY_CARE_PROVIDER_SITE_OTHER): Payer: Medicare Other | Admitting: Ophthalmology

## 2015-12-06 DIAGNOSIS — E113513 Type 2 diabetes mellitus with proliferative diabetic retinopathy with macular edema, bilateral: Secondary | ICD-10-CM | POA: Diagnosis not present

## 2015-12-06 DIAGNOSIS — H43812 Vitreous degeneration, left eye: Secondary | ICD-10-CM | POA: Diagnosis not present

## 2015-12-06 DIAGNOSIS — H35033 Hypertensive retinopathy, bilateral: Secondary | ICD-10-CM | POA: Diagnosis not present

## 2015-12-06 DIAGNOSIS — E11311 Type 2 diabetes mellitus with unspecified diabetic retinopathy with macular edema: Secondary | ICD-10-CM | POA: Diagnosis not present

## 2015-12-06 DIAGNOSIS — I10 Essential (primary) hypertension: Secondary | ICD-10-CM

## 2015-12-06 LAB — HM DIABETES EYE EXAM

## 2015-12-24 ENCOUNTER — Emergency Department (HOSPITAL_COMMUNITY): Payer: Medicare Other

## 2015-12-24 ENCOUNTER — Encounter (HOSPITAL_COMMUNITY): Payer: Self-pay | Admitting: Emergency Medicine

## 2015-12-24 ENCOUNTER — Emergency Department (HOSPITAL_COMMUNITY)
Admission: EM | Admit: 2015-12-24 | Discharge: 2015-12-24 | Disposition: A | Discharge status: Home / Self Care | Payer: Medicare Other | Attending: Emergency Medicine | Admitting: Emergency Medicine

## 2015-12-24 DIAGNOSIS — I10 Essential (primary) hypertension: Secondary | ICD-10-CM | POA: Insufficient documentation

## 2015-12-24 DIAGNOSIS — N390 Urinary tract infection, site not specified: Secondary | ICD-10-CM | POA: Diagnosis not present

## 2015-12-24 DIAGNOSIS — Z79899 Other long term (current) drug therapy: Secondary | ICD-10-CM | POA: Insufficient documentation

## 2015-12-24 DIAGNOSIS — Z794 Long term (current) use of insulin: Secondary | ICD-10-CM | POA: Diagnosis not present

## 2015-12-24 DIAGNOSIS — S32000A Wedge compression fracture of unspecified lumbar vertebra, initial encounter for closed fracture: Secondary | ICD-10-CM

## 2015-12-24 DIAGNOSIS — R112 Nausea with vomiting, unspecified: Secondary | ICD-10-CM

## 2015-12-24 DIAGNOSIS — Z7982 Long term (current) use of aspirin: Secondary | ICD-10-CM | POA: Insufficient documentation

## 2015-12-24 DIAGNOSIS — E119 Type 2 diabetes mellitus without complications: Secondary | ICD-10-CM | POA: Insufficient documentation

## 2015-12-24 DIAGNOSIS — J449 Chronic obstructive pulmonary disease, unspecified: Secondary | ICD-10-CM | POA: Insufficient documentation

## 2015-12-24 DIAGNOSIS — I2510 Atherosclerotic heart disease of native coronary artery without angina pectoris: Secondary | ICD-10-CM | POA: Insufficient documentation

## 2015-12-24 DIAGNOSIS — M4856XA Collapsed vertebra, not elsewhere classified, lumbar region, initial encounter for fracture: Secondary | ICD-10-CM | POA: Insufficient documentation

## 2015-12-24 DIAGNOSIS — R9431 Abnormal electrocardiogram [ECG] [EKG]: Secondary | ICD-10-CM | POA: Diagnosis not present

## 2015-12-24 DIAGNOSIS — F17210 Nicotine dependence, cigarettes, uncomplicated: Secondary | ICD-10-CM | POA: Diagnosis not present

## 2015-12-24 DIAGNOSIS — Z9660 Presence of unspecified orthopedic joint implant: Secondary | ICD-10-CM | POA: Insufficient documentation

## 2015-12-24 DIAGNOSIS — M549 Dorsalgia, unspecified: Secondary | ICD-10-CM

## 2015-12-24 LAB — URINALYSIS, ROUTINE W REFLEX MICROSCOPIC
Bilirubin Urine: NEGATIVE
Glucose, UA: 100 mg/dL — AB
HGB URINE DIPSTICK: NEGATIVE
Ketones, ur: NEGATIVE mg/dL
NITRITE: NEGATIVE
Protein, ur: 30 mg/dL — AB
SPECIFIC GRAVITY, URINE: 1.013 (ref 1.005–1.030)
pH: 7.5 (ref 5.0–8.0)

## 2015-12-24 LAB — COMPREHENSIVE METABOLIC PANEL
ALBUMIN: 3.6 g/dL (ref 3.5–5.0)
ALT: 12 U/L — ABNORMAL LOW (ref 14–54)
AST: 16 U/L (ref 15–41)
Alkaline Phosphatase: 81 U/L (ref 38–126)
Anion gap: 8 (ref 5–15)
BUN: 10 mg/dL (ref 6–20)
CHLORIDE: 100 mmol/L — AB (ref 101–111)
CO2: 26 mmol/L (ref 22–32)
Calcium: 9.2 mg/dL (ref 8.9–10.3)
Creatinine, Ser: 0.54 mg/dL (ref 0.44–1.00)
GFR calc Af Amer: >60 mL/min (ref >60)
GLUCOSE: 89 mg/dL (ref 65–99)
POTASSIUM: 3.5 mmol/L (ref 3.5–5.1)
SODIUM: 134 mmol/L — AB (ref 135–145)
Total Bilirubin: 0.8 mg/dL (ref 0.3–1.2)
Total Protein: 7.1 g/dL (ref 6.5–8.1)

## 2015-12-24 LAB — URINE MICROSCOPIC-ADD ON

## 2015-12-24 LAB — CBC
HCT: 40 % (ref 36.0–46.0)
HEMOGLOBIN: 13.7 g/dL (ref 12.0–15.0)
MCH: 32.8 pg (ref 26.0–34.0)
MCHC: 34.3 g/dL (ref 30.0–36.0)
MCV: 95.7 fL (ref 78.0–100.0)
Platelets: 251 10*3/uL (ref 150–400)
RBC: 4.18 MIL/uL (ref 3.87–5.11)
RDW: 12.6 % (ref 11.5–15.5)
WBC: 7.1 10*3/uL (ref 4.0–10.5)

## 2015-12-24 LAB — I-STAT TROPONIN, ED: TROPONIN I, POC: 0 ng/mL (ref 0.00–0.08)

## 2015-12-24 LAB — LIPASE, BLOOD: LIPASE: 20 U/L (ref 11–51)

## 2015-12-24 MED ORDER — ONDANSETRON HCL 4 MG/2ML IJ SOLN
4.0000 mg | Freq: Once | INTRAMUSCULAR | Status: AC
  Administered 2015-12-24: 4 mg via INTRAVENOUS
  Filled 2015-12-24: qty 2

## 2015-12-24 MED ORDER — FOSFOMYCIN TROMETHAMINE 3 G PO PACK
3.0000 g | PACK | Freq: Once | ORAL | Status: AC
  Administered 2015-12-24: 3 g via ORAL
  Filled 2015-12-24: qty 3

## 2015-12-24 MED ORDER — SODIUM CHLORIDE 0.9 % IV BOLUS (SEPSIS)
500.0000 mL | Freq: Once | INTRAVENOUS | Status: AC
  Administered 2015-12-24: 500 mL via INTRAVENOUS

## 2015-12-24 MED ORDER — ONDANSETRON 4 MG PO TBDP: 4.0000 mg | ORAL_TABLET | Freq: Three times a day (TID) | ORAL | Status: DC | PRN

## 2015-12-24 MED ORDER — HYDROCODONE-ACETAMINOPHEN 5-325 MG PO TABS: 1.0000 | ORAL_TABLET | Freq: Four times a day (QID) | ORAL | Status: DC | PRN

## 2015-12-24 NOTE — ED Notes (Signed)
PA at bedside.

## 2015-12-24 NOTE — ED Notes (Signed)
Pt. reports low back pain onset Sunday after moving a heavy chair , pt. added emesis today and hypertensive at triage , pt. did not take her antihypertensive medication today .

## 2015-12-24 NOTE — ED Provider Notes (Signed)
CSN: [id number]     Arrival date & time 12/24/15  2019 History   First MD Initiated Contact with Patient 12/24/15 2057     Chief Complaint  Patient presents with  . Back Pain  . Emesis  . Hypertension     (Consider location/radiation/quality/duration/timing/severity/associated sxs/prior Treatment) HPI Comments: Tara Flores is a 80 y.o. female with a PMHx of HTN, DM2, asthma, hypothyroidism, HLD, CAD, anxiety, COPD, macular degeneration with legal blindness, and arthritis, with a PSHx of appendectomy "in 4th grade", who presents to the ED with complaints of 2 days of lower back pain after she twisted it while trying to move heavy chair. She describes the pain as 8/10 intermittent nonradiating pain that she is unable to describe, located in her lower back, worse with movement, and relieved with Vicodin. She states that she didn't eat all day and took Vicodin on an empty stomach which has caused her to have some nausea and one episode of nonbloody nonbilious emesis. She states that this happens when she takes Vicodin on an empty stomach. She has been unable to take any of her home medications by mouth, although she has taken her insulin and ate an ice pop earlier. This includes not being able to take her hypertension medications.  She denies any fevers, chills, chest pain, shortness breath, abdominal pain, diarrhea, constipation, obstipation, melena, hematochezia, hematemesis, dysuria, hematuria, flank pain, incontinence of urine or stool, saddle anesthesia or cauda equina symptoms, numbness, tingling, weakness, lightheadedness, headache, recent travel, suspicious food intake, alcohol use, sick contacts, or NSAID use.  Patient is a 80 y.o. female presenting with back pain, vomiting, and hypertension. The history is provided by the patient and medical records. No language interpreter was used.  Back Pain Location:  Lumbar spine Quality:  Unable to specify Radiates to:  Does not radiate Pain  severity:  Moderate Pain is:  Same all the time Onset quality:  Gradual Duration:  2 days Timing:  Intermittent Progression:  Unchanged Chronicity:  New Context: lifting heavy objects and twisting   Relieved by:  Narcotics Worsened by:  Movement Ineffective treatments:  None tried Associated symptoms: no abdominal pain, no bladder incontinence, no bowel incontinence, no chest pain, no dysuria, no fever, no headaches, no numbness, no paresthesias, no perianal numbness, no tingling and no weakness   Emesis Associated symptoms: no abdominal pain, no arthralgias, no chills, no diarrhea, no headaches and no myalgias   Hypertension Associated symptoms include nausea and vomiting. Pertinent negatives include no abdominal pain, arthralgias, chest pain, chills, fever, headaches, myalgias, numbness or weakness.    Past Medical History  Diagnosis Date  . Hypertension   . Diabetes mellitus without complication (HCC)   . Asthma   . Hypothyroidism   . Cardiac disease   . Hyperlipidemia   . CAD (coronary artery disease)   . Anxiety   . Arthritis   . COPD (chronic obstructive pulmonary disease) [facility])    Past Surgical History  Procedure Laterality Date  . Cardiac catheterization N/A 02/05/2015    Procedure: Left Heart Cath and Coronary Angiography;  Surgeon: Kathleene Hazel, MD;  Location: [facility] INVASIVE CV LAB;  Service: Cardiovascular;  Laterality: N/A;  . Intramedullary (im) nail intertrochanteric Right 02/06/2015    Procedure: INTRAMEDULLARY (IM) NAIL RIGHT HIP;  Surgeon: Tarry Kos, MD;  Location: [facility] OR;  Service: Orthopedics;  Laterality: Right;  . Hip fracture surgery    . Eye surgery  2006    unsure if exact procedure  .  Joint replacement     Family History  Problem Relation Age of Onset  . Alcohol abuse Father   . Cancer Mother     lung  . Heart disease Mother   . Diabetes Sister   . Heart disease Sister    Social History  Substance Use Topics  . Smoking status:  Current Every Day Smoker -- 1.00 packs/day for 60 years  . Smokeless tobacco: Never Used     Comment: has recently cut back, conts to cut back but repors she has a lot of excuses   . Alcohol Use: No   OB History    No data available     Review of Systems  Constitutional: Negative for fever and chills.  Respiratory: Negative for shortness of breath.   Cardiovascular: Negative for chest pain.  Gastrointestinal: Positive for nausea and vomiting. Negative for abdominal pain, diarrhea, constipation and bowel incontinence.  Genitourinary: Negative for bladder incontinence, dysuria, hematuria, flank pain and difficulty urinating (no incontinence).  Musculoskeletal: Positive for back pain. Negative for myalgias and arthralgias.  Skin: Negative for color change.  Allergic/Immunologic: Positive for immunocompromised state (DM2).  Neurological: Negative for tingling, weakness, light-headedness, numbness, headaches and paresthesias.  Psychiatric/Behavioral: Negative for confusion.   10 Systems reviewed and are negative for acute change except as noted in the HPI.    Allergies  Keflex  Home Medications   Prior to Admission medications   Medication Sig Start Date End Date Taking? Authorizing Provider  albuterol (PROVENTIL HFA;VENTOLIN HFA) 108 (90 Base) MCG/ACT inhaler Inhale 1 puff into the lungs every 6 (six) hours as needed for wheezing or shortness of breath. Reported on 09/06/2015 09/25/15   Elvina Sidle, MD  aspirin 81 MG tablet Take 81 mg by mouth daily.    Historical Provider, MD  carvedilol (COREG) 6.25 MG tablet TAKE ONE TABLET BY MOUTH TWICE DAILY WITH MEALS 09/19/15   Sharon Seller, NP  Cholecalciferol (VITAMIN D) 2000 units tablet Take 2,000 Units by mouth daily.    Historical Provider, MD  insulin NPH Human (HUMULIN N,NOVOLIN N) 100 UNIT/ML injection Inject 27 units SQ in the am & inject 5-6 units SQ in the pm    Historical Provider, MD  levothyroxine (SYNTHROID, LEVOTHROID)  137 MCG tablet TAKE ONE TABLET BY MOUTH ONCE DAILY 12/02/15   Sharon Seller, NP  lisinopril (PRINIVIL,ZESTRIL) 10 MG tablet Take 1 tablet (10 mg total) by mouth daily. 03/21/15   Wendall Stade, MD  Multiple Vitamin (MULTI VITAMIN PO) Take 1 tablet by mouth daily. Reported on 09/06/2015    Historical Provider, MD  nitroGLYCERIN (NITROSTAT) 0.4 MG SL tablet Place 1 tablet (0.4 mg total) under the tongue every 5 (five) minutes as needed for chest pain. 07/18/15   Wendall Stade, MD  Omega-3 Fatty Acids (OMEGA 3 PO) Take 1 capsule by mouth daily. Reported on 09/06/2015    Historical Provider, MD  traMADol (ULTRAM) 50 MG tablet Take 1 tablet (50 mg total) by mouth every 8 (eight) hours as needed. 09/25/15   Elvina Sidle, MD   BP 198/81 mmHg  Pulse 67  Temp(Src) 98.6 F (37 C) (Oral)  Resp 16  SpO2 96% Physical Exam  Constitutional: She is oriented to person, place, and time. Vital signs are normal. She appears well-developed and well-nourished.  Non-toxic appearance. No distress.  Afebrile, nontoxic, NAD, HTN 190/80s similar to prior outpatient visits (baseline ~170/80s)  HENT:  Head: Normocephalic and atraumatic.  Mouth/Throat: Oropharynx is clear and moist and  mucous membranes are normal.  Eyes: Conjunctivae and EOM are normal. Right eye exhibits no discharge. Left eye exhibits no discharge.  Neck: Normal range of motion. Neck supple. No spinous process tenderness and no muscular tenderness present. No rigidity. Normal range of motion present.  Cardiovascular: Normal rate, regular rhythm, normal heart sounds and intact distal pulses.  Exam reveals no gallop and no friction rub.   No murmur heard. RRR, nl s1/s2, no m/r/g, distal pulses intact, no pedal edema   Pulmonary/Chest: Effort normal and breath sounds normal. No respiratory distress. She has no decreased breath sounds. She has no wheezes. She has no rhonchi. She has no rales.  Abdominal: Soft. Normal appearance and bowel sounds are  normal. She exhibits no distension. There is no tenderness. There is no rigidity, no rebound, no guarding, no CVA tenderness, no tenderness at McBurney's point and negative Murphy's sign.  Soft, NTND, +BS throughout, no r/g/r, neg murphy's, neg mcburney's, no CVA TTP   Musculoskeletal: Normal range of motion.       Lumbar back: She exhibits tenderness and spasm. She exhibits normal range of motion, no bony tenderness and no deformity.       Back:  Lumbar spine with FROM intact although somewhat painful to make position changes, without spinous process TTP, no bony stepoffs or deformities, with mild b/l paraspinous muscle TTP and muscle spasms. Strength and sensation grossly intact in all extremities, negative SLR bilaterally, gait steady. No overlying skin changes. Distal pulses intact.   Neurological: She is alert and oriented to person, place, and time. She has normal strength. No sensory deficit.  Skin: Skin is warm, dry and intact. No rash noted.  Psychiatric: She has a normal mood and affect.  Nursing note and vitals reviewed.   ED Course  Procedures (including critical care time) Labs Review Labs Reviewed  COMPREHENSIVE METABOLIC PANEL - Abnormal; Notable for the following:    Sodium 134 (*)    Chloride 100 (*)    ALT 12 (*)    All other components within normal limits  URINALYSIS, ROUTINE W REFLEX MICROSCOPIC (NOT AT [facility]) - Abnormal; Notable for the following:    APPearance CLOUDY (*)    Glucose, UA 100 (*)    Protein, ur 30 (*)    Leukocytes, UA SMALL (*)    All other components within normal limits  URINE MICROSCOPIC-ADD ON - Abnormal; Notable for the following:    Squamous Epithelial / LPF 6-30 (*)    Bacteria, UA MANY (*)    All other components within normal limits  URINE CULTURE  CBC  LIPASE, BLOOD  I-STAT TROPOININ, ED    Imaging Review Dg Thoracic Spine W/swimmers  12/24/2015  CLINICAL DATA:  Twisted back 3 days ago while getting out of chair at home. Assess  back pain. History of hypertension and diabetes. EXAM: THORACIC SPINE - 3 VIEWS COMPARISON:  Chest radiograph September 06, 2015 FINDINGS: There is no evidence of thoracic spine fracture. Patient is very osteopenic. Alignment is normal. No other significant bone abnormalities are identified. Aortic calcific atherosclerosis. IMPRESSION: No definite fracture deformity or malalignment though limited by osteopenia. Electronically Signed   By: Awilda Metro M.D.   On: 12/24/2015 22:39   Dg Lumbar Spine Complete  12/24/2015  CLINICAL DATA:  Twisted back 3 days ago while getting out of chair at home. Assess back pain. History of hypertension and diabetes. EXAM: LUMBAR SPINE - COMPLETE 4+ VIEW COMPARISON:  None. FINDINGS: Mild L1 compression fracture suspected though  limited by severe osteopenia. No malalignment. Intervertebral disc heights generally preserved. Mild lower lumbar facet arthropathy. No definite pars interarticularis defects. Severe aortoiliac calcific atherosclerosis. Status post RIGHT femur ORIF, incompletely characterized. IMPRESSION: Mild suspected acute L1 compression fracture, though limited by osteopenia. No malalignment. Electronically Signed   By: Awilda Metroourtnay  Bloomer M.D.   On: 12/24/2015 22:42   Dg Abd Acute W/chest  12/24/2015  CLINICAL DATA:  Low back pain. Nausea and vomiting. Evaluate for obstruction versus perforation versus low back etiology versus kidney stone versus other. EXAM: DG ABDOMEN ACUTE W/ 1V CHEST COMPARISON:  Chest x-ray dated 09/06/2015. FINDINGS: Single view of the chest: Mild cardiomegaly is stable. Overall cardiomediastinal silhouette is stable in size and configuration. Lungs are hyperexpanded. No evidence of pneumonia. No pleural effusion or pneumothorax seen. Supine and upright views of the abdomen: Overall bowel gas pattern is nonobstructive. No evidence of free intraperitoneal air seen. No soft tissue mass or abnormal fluid collection seen. Atherosclerotic calcifications  noted within the abdominal aorta and pelvic vasculature. Degenerative changes noted within the slightly scoliotic thoracolumbar spine. Fixation hardware within the proximal right femur. No acute-appearing osseous abnormality appreciated. IMPRESSION: 1. Hyperexpanded lungs suggesting COPD. Suspect some degree of associated chronic interstitial lung disease. No evidence of pneumonia. 2. Mild cardiomegaly, stable. 3. Nonobstructive bowel gas pattern and no evidence of acute intra-abdominal abnormality. 4. Aortic atherosclerosis. 5. Multiple compression deformities within the thoracic and lumbar spine, of uncertain age, difficult to definitively characterize without a lateral projection. Consider dedicated plain film examination of the thoracic and lumbar spine if symptoms could be related to an acute compression fracture. Electronically Signed   By: Bary RichardStan  Maynard M.D.   On: 12/24/2015 21:46   I have personally reviewed and evaluated these images and lab results as part of my medical decision-making.   EKG Interpretation   Date/Time:  Tuesday December 24 2015 22:01:43 EDT Ventricular Rate:  77 PR Interval:    QRS Duration: 98 QT Interval:  414 QTC Calculation: 469 R Axis:   8 Text Interpretation:  Sinus rhythm Probable left ventricular hypertrophy  Anterior Q waves, possibly due to LVH ST depression in inferior and  lateral leads Confirmed by NGUYEN, EMILY ([id number]) on 12/24/2015 10:17:13 PM      MDM   Final diagnoses:  Back pain  Non-intractable vomiting with nausea, vomiting of unspecified type  UTI (lower urinary tract infection)  HTN (hypertension), benign  Abnormal EKG  Lumbar compression fracture, closed, initial encounter [facility](HCC)    80 y.o. female here with low back pain after twisting trying to move a heavy chair on Sunday. No cauda equina symptoms or saddle anesthesia, b/l extremities NVI with soft compartments, ambulatory without difficulty. No midline spinal TTP, mild b/l paraspinous  muscle tenderness and spasm. No flank tenderness. States she took vicodin on an empty stomach and feels that it made her ill, having N/V now, but denies abd pain, no abd tenderness or distension, adequate BS throughout. Hasn't eaten all day, and didn't take BP meds, so pt noted to have BP 190s/80s but her outpatient visits show her baseline is 170s/80s so this isn't far off from what she is normally. No s/sx of HTN. Will give fluids and zofran for n/v, pt declines wanting pain meds; will get labs including trop/lipase, CMP pending, CBC WNL. U/A pending. Will also obtain EKG and Acute abd series to see if we can see any intraabdominal pathology/obstruction or perf/etc, kidney stones, or spinal pathology. Doubt need for dedicated lumbar imaging since  she has no midline tenderness. Will reassess after labs/imaging results.   10:12 PM Trop neg, EKG without acute ischemic findings (some inferior and lateral ST depression but this was seen on EKG from 01/2015), lipase WNL, U/A with small leuks 6-30 squamous 0-5 WBC and many bacteria which could reflect contaminated specimen but given age/RFs/back pain symptom will tx and send for UCx. CMP with Na 134 which is baseline, no other acute findings. Acute abd series reveals chronic COPD changes, chronic cardiomegaly, no abdominal pathologies, and multiple compression deformities of T/L spine which is difficult to characterize and recommends dedicated plain films. Will proceed with this to eval for possible compression fx. Discussed case with my attending Dr. Cyndie ChimeNguyen who agrees with plan. Pt feeling better with nausea, BP improving to her baseline level 178/76, denies current concerns, will reassess after xrays return.   10:57 PM Tspine xray neg. Lspine xray showing acute L1 compression fx, likely the cause of her symptoms. Pt feeling better, eating crackers now and will get fosfomycin once here to tx the questionable UTI. Will refill pain meds, and give rx for zofran, and  have her f/up with PCP in 1wk for recheck and ongoing management of her compression fx. Ice/heat use discussed. Pt tolerating PO well now, pain controlled, stable for d/c. I explained the diagnosis and have given explicit precautions to return to the ER including for any other new or worsening symptoms. The patient understands and accepts the medical plan as it's been dictated and I have answered their questions. Discharge instructions concerning home care and prescriptions have been given. The patient is STABLE and is discharged to home in good condition.  BP 178/76 mmHg  Pulse 76  Temp(Src) 98.6 F (37 C) (Oral)  Resp 21  SpO2 97%  Meds ordered this encounter  Medications  . ondansetron (ZOFRAN) injection 4 mg    Sig:   . sodium chloride 0.9 % bolus 500 mL    Sig:   . fosfomycin (MONUROL) packet 3 g    Sig:   . HYDROcodone-acetaminophen (NORCO) 5-325 MG tablet    Sig: Take 1 tablet by mouth every 6 (six) hours as needed for severe pain.    Dispense:  20 tablet    Refill:  0    Order Specific Question:  Supervising Provider    Answer:  MILLER, BRIAN [[id number]]  . ondansetron (ZOFRAN ODT) 4 MG disintegrating tablet    Sig: Take 1 tablet (4 mg total) by mouth every 8 (eight) hours as needed for nausea or vomiting.    Dispense:  15 tablet    Refill:  0    Order Specific Question:  Supervising Provider    Answer:  Eber HongMILLER, BRIAN [[id number]]     Deklen Popelka Camprubi-Soms, PA-C 12/24/15 2259  Leta BaptistEmily Roe Nguyen, MD 12/26/15 [phone]

## 2015-12-24 NOTE — Discharge Instructions (Signed)
Your back pain is from a compression fracture in your lower back vertebra. Take ibuprofen or norco as directed as needed for pain. Do not drive or operate machinery with pain medication use. Always take these medications on a full stomach. Use zofran as directed for nausea. You had some signs of a urinary tract infection but you were treated today with antibiotics so you do not need any further treatment. Use ice and heat to the areas of pain to help with pain relief. Follow up with your primary care physician for recheck of symptoms and ongoing management of your back pain in the next 1 week. Return to ER for emergent changing or worsening of symptoms.     Back Pain, Adult Back pain is very common. The pain often gets better over time. The cause of back pain is usually not dangerous. Most people can learn to manage their back pain on their own.  HOME CARE  Watch your back pain for any changes. The following actions may help to lessen any pain you are feeling:  Stay active. Start with short walks on flat ground if you can. Try to walk farther each day.  Exercise regularly as told by your doctor. Exercise helps your back heal faster. It also helps avoid future injury by keeping your muscles strong and flexible.  Do not sit, drive, or stand in one place for more than 30 minutes.  Do not stay in bed. Resting more than 1-2 days can slow down your recovery.  Be careful when you bend or lift an object. Use good form when lifting:  Bend at your knees.  Keep the object close to your body.  Do not twist.  Sleep on a firm mattress. Lie on your side, and bend your knees. If you lie on your back, put a pillow under your knees.  Take medicines only as told by your doctor.  Put ice on the injured area.  Put ice in a plastic bag.  Place a towel between your skin and the bag.  Leave the ice on for 20 minutes, 2-3 times a day for the first 2-3 days. After that, you can switch between ice and heat  packs.  Avoid feeling anxious or stressed. Find good ways to deal with stress, such as exercise.  Maintain a healthy weight. Extra weight puts stress on your back. GET HELP IF:   You have pain that does not go away with rest or medicine.  You have worsening pain that goes down into your legs or buttocks.  You have pain that does not get better in one week.  You have pain at night.  You lose weight.  You have a fever or chills. GET HELP RIGHT AWAY IF:   You cannot control when you poop (bowel movement) or pee (urinate).  Your arms or legs feel weak.  Your arms or legs lose feeling (numbness).  You feel sick to your stomach (nauseous) or throw up (vomit).  You have belly (abdominal) pain.  You feel like you may pass out (faint).   This information is not intended to replace advice given to you by your health care provider. Make sure you discuss any questions you have with your health care provider.   Document Released: 11/25/2007 Document Revised: 06/29/2014 Document Reviewed: 10/10/2013 Elsevier Interactive Patient Education 2016 [employer]  Lumbar Fracture A lumbar fracture is a break in one of the bones of the lower back. Lumbar fractures range in severity. Severe fractures can damage  the spinal cord. CAUSES This condition may be caused by:  A fall (common).  A car accident (common).  A gunshot wound.  A hard, direct hit to the back.  Osteoporosis. SYMPTOMS The main symptom of this condition is severe pain in the lower back. If a fracture is complex or severe, there may also be:  A misshapen or swollen area on the lower back.  A limited ability to move an area of the lower back.  An inability to empty the bladder or bowel.  A loss of strength or sensation in the legs, feet, and toes.  Paralysis. DIAGNOSIS This condition is diagnosed based on:  A physical exam.  Symptoms and what happened just before they developed.  The results of imaging  tests, such as an X-ray, CT scan, or MRI. If your nerves have been damaged, you may also have other tests to find out how much damage there is. TREATMENT Treatment for this condition depends on the specifics of the injury. Most fractures can be treated with:  A back brace.  Bed rest and activity restrictions.  Pain medicine.  Physical therapy. Fractures that are complex, involve multiple bones, or make the spine unstable may require surgery to remove pressure from the nerves or spinal cord and to stabilize the broken pieces of bone. During recovery, it is normal to have pain and stiffness in the back for weeks. HOME CARE INSTRUCTIONS Medicines  Take medicines only as directed by your health care provider.  Do not drive or operate heavy machinery while taking pain medicine. Activity  Stay in bed for as long as directed by your health care provider.  If you were shown how to do any exercises to improve motion and strength in your back, do them as directed by your health care provider.  Return to your normal activities as directed by your health care provider. Ask your health care provider what activities are safe for you. General Instructions  If you were given a neck brace or back brace, wear it as directed by your health care provider.  Keep all follow-up visits as directed by your health care provider. This is important. Failure to follow-up as recommended could result in permanent injury, disability, and long-lasting (chronic) pain. SEEK MEDICAL CARE IF:  Your pain does not improve over time.  You have a persistent cough.  You cannot return to your normal activities as planned or expected. SEEK IMMEDIATE MEDICAL CARE IF:  You have severe pain or your pain suddenly gets worse.  You are unable to move.  You have numbness, tingling, weakness, or paralysis in any part of your body.  You cannot control your bladder or bowel.  You have difficulty breathing.  You have a  fever.  You have pain in your chest or abdomen.  You vomit.   This information is not intended to replace advice given to you by your health care provider. Make sure you discuss any questions you have with your health care provider.   Document Released: 09/23/2006 Document Revised: 10/23/2014 Document Reviewed: 06/04/2014 Elsevier Interactive Patient Education 2016 [place].   Spinal Compression Fracture A spinal compression fracture is a collapse of the bones that form the spine (vertebrae). With this type of fracture, the vertebrae become squashed (compressed) into a wedge shape. Most compression fractures happen in the middle or lower part of the spine. CAUSES This condition may be caused by:  Thinning and loss of density in the bones (osteoporosis). This is the most common  cause.  A fall.  A car or motorcycle accident.  Cancer.  Trauma, such as a heavy, direct hit to the head. RISK FACTORS You may be at greater risk for a spinal compression fracture if you:  Are 79 years old or older.  Have osteoporosis.  Have certain types of cancer, including:  Multiple myeloma.  Lymphoma.  Prostate cancer.  Lung cancer.  Breast cancer. SYMPTOMS Symptoms of this condition include:  Severe pain.  Pain that gets worse over time.  Pain that is worse when you stand, walk, sit, or bend.  Sudden pain that is so bad that it is hard for you to move.  Bending or humping of the spine.  Gradual loss of height.  Numbness, tingling, or weakness in the back and legs.  Trouble walking. Your symptoms will depend on the cause of the fracture and how quickly it develops. For example, fractures that are caused by osteoporosis can cause few symptoms, no symptoms, or symptoms that develop slowly over time. DIAGNOSIS This condition may be diagnosed based on symptoms, medical history, and a physical exam. During the physical exam, your health care provider may tap along the length  of your spine to check for tenderness. Tests may be done to confirm the diagnosis. They may include:  A bone density test to check for osteoporosis.  Imaging tests, such as a spine X-ray, a CT scan, or MRI. TREATMENT Treatment for this condition depends on the cause and severity of the condition.Some fractures, such as those that are caused by osteoporosis, may heal on their own with supportive care. This may include:  Pain medicine.  Rest.  A back brace.  Physical therapy exercises.  Medicine that reduces bone pain.  Calcium and vitamin D supplements. Fractures that cause the back to become misshapen, cause nerve pain or weakness, or do not respond to other treatment may be treated with a surgical procedure, such as:  Vertebroplasty. In this procedure, bone cement is injected into the collapsed vertebrae to stabilize them.  Balloon kyphoplasty. In this procedure, the collapsed vertebrae are expanded with a balloon and then bone cement is injected into them.  Spinal fusion. In this procedure, the collapsed vertebrae are connected (fused) to normal vertebrae. HOME CARE INSTRUCTIONS General Instructions  Take medicines only as directed by your health care provider.  Do not drive or operate heavy machinery while taking pain medicine.  If directed, apply ice to the injured area:  Put ice in a plastic bag.  Place a towel between your skin and the bag.  Leave the ice on for 30 minutes every two hours at first. Then apply the ice as needed.  Wear your neck brace or back brace as directed by your health care provider.  Do not drink alcohol. Alcohol can interfere with your treatment.  Keep all follow-up visits as directed by your health care provider. This is important. It can help to prevent permanent injury, disability, and long-lasting (chronic) pain. Activity  Stay in bed (on bed rest) only as directed by your health care provider. Being on bed rest for too long can make  your condition worse.  Return to your normal activities as directed by your health care provider. Ask what activities are safe for you.  Do exercises to improve motion and strength in your back (physical therapy), as recommended by your health care provider.  Exercise regularly as directed by your health care provider. SEEK MEDICAL CARE IF:  You have a fever.  You develop  a cough that makes your pain worse.  Your pain medicine is not helping.  Your pain does not get better over time.  You cannot return to your normal activities as planned or expected. SEEK IMMEDIATE MEDICAL CARE IF:  Your pain is very bad and it suddenly gets worse.  You are unable to move any body part (paralysis) that is below the level of your injury.  You have numbness, tingling, or weakness in any body part that is below the level of your injury.  You cannot control your bladder or bowels.   This information is not intended to replace advice given to you by your health care provider. Make sure you discuss any questions you have with your health care provider.   Document Released: 06/08/2005 Document Revised: 10/23/2014 Document Reviewed: 06/12/2014 Elsevier Interactive Patient Education 2016 [employer]  Urinary Tract Infection Urinary tract infections (UTIs) can develop anywhere along your urinary tract. Your urinary tract is your body's drainage system for removing wastes and extra water. Your urinary tract includes two kidneys, two ureters, a bladder, and a urethra. Your kidneys are a pair of bean-shaped organs. Each kidney is about the size of your fist. They are located below your ribs, one on each side of your spine. CAUSES Infections are caused by microbes, which are microscopic organisms, including fungi, viruses, and bacteria. These organisms are so small that they can only be seen through a microscope. Bacteria are the microbes that most commonly cause UTIs. SYMPTOMS  Symptoms of UTIs may vary  by age and gender of the patient and by the location of the infection. Symptoms in young women typically include a frequent and intense urge to urinate and a painful, burning feeling in the bladder or urethra during urination. Older women and men are more likely to be tired, shaky, and weak and have muscle aches and abdominal pain. A fever may mean the infection is in your kidneys. Other symptoms of a kidney infection include pain in your back or sides below the ribs, nausea, and vomiting. DIAGNOSIS To diagnose a UTI, your caregiver will ask you about your symptoms. Your caregiver will also ask you to provide a urine sample. The urine sample will be tested for bacteria and white blood cells. White blood cells are made by your body to help fight infection. TREATMENT  Typically, UTIs can be treated with medication. Because most UTIs are caused by a bacterial infection, they usually can be treated with the use of antibiotics. The choice of antibiotic and length of treatment depend on your symptoms and the type of bacteria causing your infection. HOME CARE INSTRUCTIONS  If you were prescribed antibiotics, take them exactly as your caregiver instructs you. Finish the medication even if you feel better after you have only taken some of the medication.  Drink enough water and fluids to keep your urine clear or pale yellow.  Avoid caffeine, tea, and carbonated beverages. They tend to irritate your bladder.  Empty your bladder often. Avoid holding urine for long periods of time.  Empty your bladder before and after sexual intercourse.  After a bowel movement, women should cleanse from front to back. Use each tissue only once. SEEK MEDICAL CARE IF:   You have back pain.  You develop a fever.  Your symptoms do not begin to resolve within 3 days. SEEK IMMEDIATE MEDICAL CARE IF:   You have severe back pain or lower abdominal pain.  You develop chills.  You have nausea or vomiting.  You have  continued burning or discomfort with urination. MAKE SURE YOU:   Understand these instructions.  Will watch your condition.  Will get help right away if you are not doing well or get worse.   This information is not intended to replace advice given to you by your health care provider. Make sure you discuss any questions you have with your health care provider.   Document Released: 03/18/2005 Document Revised: 02/27/2015 Document Reviewed: 07/17/2011 Elsevier Interactive Patient Education 2016 [employer] Nausea and Vomiting Nausea means you feel sick to your stomach. Throwing up (vomiting) is a reflex where stomach contents come out of your mouth. HOME CARE   Take medicine as told by your doctor.  Do not force yourself to eat. However, you do need to drink fluids.  If you feel like eating, eat a normal diet as told by your doctor.  Eat rice, wheat, potatoes, bread, lean meats, yogurt, fruits, and vegetables.  Avoid high-fat foods.  Drink enough fluids to keep your pee (urine) clear or pale yellow.  Ask your doctor how to replace body fluid losses (rehydrate). Signs of body fluid loss (dehydration) include:  Feeling very thirsty.  Dry lips and mouth.  Feeling dizzy.  Dark pee.  Peeing less than normal.  Feeling confused.  Fast breathing or heart rate. GET HELP RIGHT AWAY IF:   You have blood in your throw up.  You have black or bloody poop (stool).  You have a bad headache or stiff neck.  You feel confused.  You have bad belly (abdominal) pain.  You have chest pain or trouble breathing.  You do not pee at least once every 8 hours.  You have cold, clammy skin.  You keep throwing up after 24 to 48 hours.  You have a fever. MAKE SURE YOU:   Understand these instructions.  Will watch your condition.  Will get help right away if you are not doing well or get worse.   This information is not intended to replace advice given to you by your health  care provider. Make sure you discuss any questions you have with your health care provider.   Document Released: 11/25/2007 Document Revised: 08/31/2011 Document Reviewed: 11/07/2010 Elsevier Interactive Patient Education [employer].

## 2015-12-24 NOTE — ED Notes (Signed)
Patient transported to X-ray 

## 2015-12-26 ENCOUNTER — Encounter: Payer: Self-pay | Admitting: Internal Medicine

## 2015-12-26 LAB — URINE CULTURE

## 2015-12-30 ENCOUNTER — Encounter: Payer: Self-pay | Admitting: Nurse Practitioner

## 2015-12-30 ENCOUNTER — Ambulatory Visit (INDEPENDENT_AMBULATORY_CARE_PROVIDER_SITE_OTHER): Payer: Medicare Other | Admitting: Nurse Practitioner

## 2015-12-30 VITALS — BP 132/76 | HR 67 | Temp 98.0°F | Resp 17 | Ht 63.0 in | Wt 134.6 lb

## 2015-12-30 DIAGNOSIS — H3530 Unspecified macular degeneration: Secondary | ICD-10-CM

## 2015-12-30 DIAGNOSIS — R600 Localized edema: Secondary | ICD-10-CM

## 2015-12-30 DIAGNOSIS — Z720 Tobacco use: Secondary | ICD-10-CM

## 2015-12-30 DIAGNOSIS — S32010D Wedge compression fracture of first lumbar vertebra, subsequent encounter for fracture with routine healing: Secondary | ICD-10-CM

## 2015-12-30 DIAGNOSIS — E11319 Type 2 diabetes mellitus with unspecified diabetic retinopathy without macular edema: Secondary | ICD-10-CM

## 2015-12-30 NOTE — Progress Notes (Signed)
Patient ID: Tara Flores, female   DOB: August 09, 1935, 80 y.o.   MRN: [id number]    PCP: Sharon Seller, NP  Advanced Directive information Does patient have an advance directive?: No, Would patient like information on creating an advanced directive?: No - patient declined information  Allergies  Allergen Reactions  . Keflex [Cephalexin] Nausea Only    Loss of appetite    Chief Complaint  Patient presents with  . Medical Management of Chronic Issues    Ed follow up lower/mid back pain, stomach upset due to taking medication on empty stomach. Unable to eat much the last few days due to nausea.     . OTHER    Daughter and Daughter in law in room with patient.   . OTHER    Family states that patient is taking more pain med than ordered. Takes 1-2 every 4 hours instead of every 6 hours.   . OTHER    lower legs are red and swelling, draining      HPI: Patient is a 80 y.o. female seen in the office today to follow up ED visit.  Pt with a PMHx of HTN, DM2, asthma, hypothyroidism, HLD, CAD, anxiety, COPD, macular degeneration with legal blindness, and arthritis. Pt was seen in ED on July 4th due to severe back pain with abdominal pain/nausea/vomiting after taking pain medication. Pt was found to have L1 compression fx and to use ice/heat.   Daughters here with her today and report things are not going well at home. She is not able to take care of herself at home. Reports her house is a mess, things everywhere.   Pain conts to be bad, taking too many of her pain pills (which causes nausea)  Nausea has improved overall. Trying to eat better, family brings her food.  When she was taking more medication pain was better.  Pain is better when she is resting, was able to from 11-7 without taking medication. Not putting ice or heat on back Last had home health therapy back in Aug 2016.   Legs are red and weeping, swollen and has been going on for 1 month. Redness actually improving overall.      Not eating correctly. Vision is getting worse, following with Dr Ashley Royalty, ophthalmology.  Reports she is giving herself insulin and checking her blood sugars with a magnifying glass however daughter questions this due to her vision Does not have blood sugar readings.    Review of Systems:  Review of Systems  Constitutional: Negative for fever and chills.  HENT: Negative for congestion.   Eyes: Negative for pain, discharge and redness.       Blind, uses walking stick  Respiratory: Positive for cough and wheezing. Negative for shortness of breath.        Smokes  Cardiovascular: Positive for leg swelling. Negative for chest pain and palpitations.  Gastrointestinal: Positive for nausea. Negative for vomiting, abdominal pain, diarrhea, constipation and abdominal distention.  Genitourinary: Negative for dysuria.  Skin: Positive for color change (chronic redness to LE).       Recent shingles right shoulder  Neurological: Negative for dizziness and headaches.    Past Medical History  Diagnosis Date  . Hypertension   . Diabetes mellitus without complication (HCC)   . Asthma   . Hypothyroidism   . Cardiac disease   . Hyperlipidemia   . CAD (coronary artery disease)   . Anxiety   . Arthritis   . COPD (chronic obstructive pulmonary disease) (  [facility]    Past Surgical History  Procedure Laterality Date  . Cardiac catheterization N/A 02/05/2015    Procedure: Left Heart Cath and Coronary Angiography;  Surgeon: Kathleene Hazelhristopher D McAlhany, MD;  [facility];  Service: Cardiovascular;  Laterality: N/A;  . Intramedullary (im) nail intertrochanteric Right 02/06/2015    Procedure: INTRAMEDULLARY (IM) NAIL RIGHT HIP;  Surgeon: Tarry KosNaiping M Xu, MD;  Location: [facility] OR;  Service: Orthopedics;  Laterality: Right;  . Hip fracture surgery    . Eye surgery  2006    unsure if exact procedure  . Joint replacement     Social History:   reports that she has been smoking.  She has never used  smokeless tobacco. She reports that she does not drink alcohol or use illicit drugs.  Family History  Problem Relation Age of Onset  . Alcohol abuse Father   . Cancer Mother     lung  . Heart disease Mother   . Diabetes Sister   . Heart disease Sister     Medications: Patient's Medications  New Prescriptions   No medications on file  Previous Medications   ALBUTEROL (PROVENTIL HFA;VENTOLIN HFA) 108 (90 BASE) MCG/ACT INHALER    Inhale 1 puff into the lungs every 6 (six) hours as needed for wheezing or shortness of breath. Reported on 09/06/2015   ASPIRIN 81 MG TABLET    Take 81 mg by mouth daily.   CARVEDILOL (COREG) 6.25 MG TABLET    TAKE ONE TABLET BY MOUTH TWICE DAILY WITH MEALS   CHOLECALCIFEROL (VITAMIN D) 2000 UNITS TABLET    Take 2,000 Units by mouth daily.   HYDROCODONE-ACETAMINOPHEN (NORCO) 5-325 MG TABLET    Take 1 tablet by mouth every 6 (six) hours as needed for severe pain.   INSULIN NPH HUMAN (HUMULIN N,NOVOLIN N) 100 UNIT/ML INJECTION    Inject 9-29 Units into the skin 2 (two) times daily. INJECT 29 UNITS INTO THE SKIN EACH MORNING AND 9 UNITS INTO THE SKIN EACH NIGHT   LEVOTHYROXINE (SYNTHROID, LEVOTHROID) 137 MCG TABLET    TAKE ONE TABLET BY MOUTH ONCE DAILY   LISINOPRIL (PRINIVIL,ZESTRIL) 10 MG TABLET    Take 1 tablet (10 mg total) by mouth daily.   MULTIPLE VITAMIN (MULTI VITAMIN PO)    Take 1 tablet by mouth daily. Reported on 09/06/2015   NITROGLYCERIN (NITROSTAT) 0.4 MG SL TABLET    Place 1 tablet (0.4 mg total) under the tongue every 5 (five) minutes as needed for chest pain.   OMEGA-3 FATTY ACIDS (OMEGA 3 PO)    Take 1 capsule by mouth daily. Reported on 09/06/2015  Modified Medications   No medications on file  Discontinued Medications   HYDROCODONE-ACETAMINOPHEN (NORCO) 7.5-325 MG TABLET    Take 1-2 tablets by mouth every 6 (six) hours as needed for severe pain.   ONDANSETRON (ZOFRAN ODT) 4 MG DISINTEGRATING TABLET    Take 1 tablet (4 mg total) by mouth every 8  (eight) hours as needed for nausea or vomiting.   TRAMADOL (ULTRAM) 50 MG TABLET    Take 1 tablet (50 mg total) by mouth every 8 (eight) hours as needed.     Physical Exam:  Filed Vitals:   12/30/15 1125  BP: 132/76  Pulse: 67  Temp: 98 F (36.7 C)  TempSrc: Oral  Resp: 17  Height: 5\' 3"  (1.6 m)  Weight: 134 lb 9.6 oz (61.054 kg)  SpO2: 98%   Body mass index is 23.85 kg/(m^2).  Physical Exam  Constitutional: She is oriented to person, place, and time. No distress.  Cardiovascular: Normal rate, regular rhythm, normal heart sounds and intact distal pulses.   Pulmonary/Chest: Effort normal. She has wheezes.  Abdominal: Soft. Bowel sounds are normal. She exhibits no distension. There is no tenderness.  Musculoskeletal: Normal range of motion. She exhibits edema (2+ bilaterally). She exhibits no tenderness.  Uses walking stick for support/visual aide  Neurological: She is alert and oriented to person, place, and time.  Skin: Skin is warm and dry.  Ashy skin tone, bilateral LE with light red color changes noted   1 cm x 0.5 open area with serous drainage. No warmth noted   Psychiatric:  anxious    Labs reviewed: Basic Metabolic Panel:  Recent Labs  16/10/96 1026  04/15/15 0924 10/17/15 1550 12/24/15 2041  NA 142  < > 141 139 134*  K 3.9  < > 4.0 4.6 3.5  CL 100  < > 97 97 100*  CO2 24  < > 23 25 26   GLUCOSE 46*  < > 213* 103* 89  BUN 9  < > 12 16 10   CREATININE 0.61  < > 0.56* 0.56* 0.54  CALCIUM 9.5  < > 9.5 9.3 9.2  TSH 2.230  --   --   --   --   < > = values in this interval not displayed. Liver Function Tests:  Recent Labs  02/19/15 12/24/15 2041  AST 34 16  ALT 28 12*  ALKPHOS 122 81  BILITOT  --  0.8  PROT  --  7.1  ALBUMIN  --  3.6    Recent Labs  12/24/15 2117  LIPASE 20   No results for input(s): AMMONIA in the last 8760 hours. CBC:  Recent Labs  02/05/15 0538  02/19/15 03/07/15 1026 09/06/15 1307 12/24/15 2041  WBC 13.8*  < > 9.6  5.6 6.5 7.1  NEUTROABS 12.1*  --   --  3.1  --   --   HGB 13.1  < > 10.5*  --  12.8 13.7  HCT 38.6  < > 32* 33.5* 35.7* 40.0  MCV 97.7  < >  --  100* 92.0 95.7  PLT 202  < > 457* 332  --  251  < > = values in this interval not displayed. Lipid Panel:  Recent Labs  02/08/15 0418  CHOL 136  HDL 52  LDLCALC 70  TRIG 72  CHOLHDL 2.6   TSH:  Recent Labs  03/07/15 1026  TSH 2.230   A1C: Lab Results  Component Value Date   HGBA1C 8.9* 10/17/2015     Assessment/Plan 1. Tobacco abuse -encouraged to stop smoking but pt does not feel motivated to do so.   2. Compression fracture of L1 lumbar vertebra, with routine healing, subsequent encounter -ongoing pain but has improved, Not using ice/heat -using Norco more than prescribed. Discussed medication management, may use tylenol 325 mg q 6 hours as needed with Norco but not to exceed 3000 mg of tylenol in 24 hours. Family and pt understands  - Ambulatory referral to Home Health pt/ot to help with pain   3. Diabetic retinopathy associated with type 2 diabetes mellitus, macular edema presence unspecified, unspecified retinopathy severity (HCC) a1C elevated in April. Pt frail diabetic, has lows and highs -feels like she is giving herself the proper amount of insulin however daughters question this due to poor vision -will get home health nursing to come out to help with medication management  4. Macular degeneration -poor vision, risk for falls in home, pt needs more assistance.  Family looking into resources to help her at home.   5. Localized edema -sleeps in recliner. Discussed need to elevate legs above the level of the heart. To use compression hose daily -1 small open area to right skin, serous drainage due to edema, to use Bactroban twice daily and keep covered, to have nursing monitor this as well. To notify if increase of drainage, area gets larger, redness to leg worsens or becomes painful   Lane Kjos K. Biagio Borg  [facility] [phone] 8 am - 5 pm) [phone] (after hours)

## 2015-12-30 NOTE — Patient Instructions (Signed)
Will order home health PT/OT/nursing/SW Nursing for diabetes and monitoring legs PT/OT for back pain  May take additional tylenol 325 mg with Vicodin   Follow up in 1 month

## 2015-12-31 ENCOUNTER — Other Ambulatory Visit: Payer: Self-pay | Admitting: Nurse Practitioner

## 2016-01-03 DIAGNOSIS — S81802A Unspecified open wound, left lower leg, initial encounter: Secondary | ICD-10-CM | POA: Diagnosis not present

## 2016-01-03 DIAGNOSIS — M25551 Pain in right hip: Secondary | ICD-10-CM

## 2016-01-03 DIAGNOSIS — E11311 Type 2 diabetes mellitus with unspecified diabetic retinopathy with macular edema: Secondary | ICD-10-CM | POA: Diagnosis not present

## 2016-01-03 DIAGNOSIS — I10 Essential (primary) hypertension: Secondary | ICD-10-CM | POA: Diagnosis not present

## 2016-01-03 DIAGNOSIS — J449 Chronic obstructive pulmonary disease, unspecified: Secondary | ICD-10-CM | POA: Diagnosis not present

## 2016-01-03 DIAGNOSIS — I2510 Atherosclerotic heart disease of native coronary artery without angina pectoris: Secondary | ICD-10-CM | POA: Diagnosis not present

## 2016-01-06 ENCOUNTER — Ambulatory Visit (INDEPENDENT_AMBULATORY_CARE_PROVIDER_SITE_OTHER): Payer: Self-pay | Admitting: Ophthalmology

## 2016-01-07 ENCOUNTER — Telehealth: Payer: Self-pay | Admitting: *Deleted

## 2016-01-07 NOTE — Telephone Encounter (Signed)
[facility] and stated that patient is taking her Tylenol wrong and would like it switched back to the Norco. Was taking Norco for her back pain and ran out and didn't get it refilled. Patient is taking the Tylenol 2 tablets up to four times daily. Can patient get the Norco Rx instead because it works well. Please Advise.

## 2016-01-09 MED ORDER — HYDROCODONE-ACETAMINOPHEN 5-325 MG PO TABS: 1.0000 | ORAL_TABLET | Freq: Four times a day (QID) | ORAL | Status: DC | PRN

## 2016-01-09 NOTE — Telephone Encounter (Signed)
Patient aware of [name],NP response. RX placed on ledge for signature, patient aware someone will call once rx signed

## 2016-01-09 NOTE — Telephone Encounter (Signed)
Spoke with patient and advised rx ready for pick-up and it will be at the front desk.  

## 2016-01-09 NOTE — Telephone Encounter (Signed)
Yes okay to refill Norco, I believe she only got a small amount when it was refilled. Please give her a 1 month supply and make sure the daughters are helping her take it appropriately

## 2016-01-10 ENCOUNTER — Telehealth: Payer: Self-pay | Admitting: *Deleted

## 2016-01-10 NOTE — Telephone Encounter (Signed)
LMOM to return call.

## 2016-01-10 NOTE — Telephone Encounter (Signed)
Patient called and stated that the Hydrocodone works well and hardly has any pain with it but she feels like it is lowering her blood sugar and she doesn't want it to drop too low. This morning it was 57. Last night before bed it was 200, so she took 6 units of insulin. Patient is alittle worried that it will drop too low. Please Advise.

## 2016-01-10 NOTE — Telephone Encounter (Signed)
Patient notified and agreed.  

## 2016-01-10 NOTE — Telephone Encounter (Signed)
Tell her to take 1/2 tablet of the hydrocodone and see if that helps, make sure she is eating bedtime snack as well

## 2016-01-23 ENCOUNTER — Telehealth: Payer: Self-pay

## 2016-01-23 NOTE — Telephone Encounter (Signed)
Dorian with [facility] called to request verbal orders to continue PT 2 x weekly for 4 weeks.  Per [employer] standing order, verbal order given. Message will be sent to patient's provider as a FYI.

## 2016-01-29 ENCOUNTER — Ambulatory Visit: Payer: Medicare Other | Admitting: Internal Medicine

## 2016-01-31 ENCOUNTER — Ambulatory Visit: Payer: Medicare Other | Admitting: Internal Medicine

## 2016-02-04 ENCOUNTER — Other Ambulatory Visit: Payer: Self-pay | Admitting: *Deleted

## 2016-02-04 MED ORDER — LEVOTHYROXINE SODIUM 137 MCG PO TABS: 137.0000 ug | ORAL_TABLET | Freq: Every day | ORAL | 3 refills | Status: DC

## 2016-02-04 MED ORDER — CARVEDILOL 6.25 MG PO TABS: 6.2500 mg | ORAL_TABLET | Freq: Two times a day (BID) | ORAL | 3 refills | Status: DC

## 2016-02-04 MED ORDER — LISINOPRIL 10 MG PO TABS: 10.0000 mg | ORAL_TABLET | Freq: Every day | ORAL | 3 refills | Status: DC

## 2016-02-04 NOTE — Telephone Encounter (Signed)
[facility] 

## 2016-02-10 ENCOUNTER — Ambulatory Visit: Payer: Medicare Other | Admitting: Nurse Practitioner

## 2016-02-27 ENCOUNTER — Telehealth: Payer: Self-pay

## 2016-02-27 NOTE — Telephone Encounter (Signed)
Patient called back and states Albuterol was originally prescribed by Urgent Care provider in March 2017. Patient states instructions state 1 vial via nebulizer every 6 hours as needed. Patient uses albuterol for allergies   Please advise on request for medication not currently on med list

## 2016-02-27 NOTE — Telephone Encounter (Signed)
Need follow up visit, overdue  May send Rx with NO refills and needs to make follow up visit

## 2016-02-27 NOTE — Telephone Encounter (Signed)
Left message on voicemail for patient to return call when available , reason for call: We received a request for nebulizer solution: Albuterol from [facility]

## 2016-02-28 MED ORDER — ALBUTEROL SULFATE (2.5 MG/3ML) 0.083% IN NEBU: 2.5000 mg | INHALATION_SOLUTION | Freq: Four times a day (QID) | RESPIRATORY_TRACT | 0 refills | Status: DC | PRN

## 2016-02-28 NOTE — Telephone Encounter (Signed)
RX sent. Staff message sent to administrative staff to call and schedule patient for a follow-up

## 2016-03-02 ENCOUNTER — Telehealth: Payer: Self-pay | Admitting: *Deleted

## 2016-03-02 NOTE — Telephone Encounter (Signed)
Misty StanleyLisa with [facility] Called and stated that they will continue seeing once a week to continue plan of care.

## 2016-03-03 DIAGNOSIS — I10 Essential (primary) hypertension: Secondary | ICD-10-CM | POA: Diagnosis not present

## 2016-03-03 DIAGNOSIS — M1991 Primary osteoarthritis, unspecified site: Secondary | ICD-10-CM

## 2016-03-03 DIAGNOSIS — F419 Anxiety disorder, unspecified: Secondary | ICD-10-CM | POA: Diagnosis not present

## 2016-03-03 DIAGNOSIS — I2510 Atherosclerotic heart disease of native coronary artery without angina pectoris: Secondary | ICD-10-CM | POA: Diagnosis not present

## 2016-03-03 DIAGNOSIS — J449 Chronic obstructive pulmonary disease, unspecified: Secondary | ICD-10-CM | POA: Diagnosis not present

## 2016-03-03 DIAGNOSIS — E11311 Type 2 diabetes mellitus with unspecified diabetic retinopathy with macular edema: Secondary | ICD-10-CM | POA: Diagnosis not present

## 2016-03-24 ENCOUNTER — Telehealth: Payer: Self-pay

## 2016-03-24 DIAGNOSIS — E118 Type 2 diabetes mellitus with unspecified complications: Secondary | ICD-10-CM

## 2016-03-24 DIAGNOSIS — Z794 Long term (current) use of insulin: Principal | ICD-10-CM

## 2016-03-24 NOTE — Telephone Encounter (Signed)
Yes please place referral for this. Thank you

## 2016-03-24 NOTE — Telephone Encounter (Signed)
Tara Flores would you like a C3 care referral on this patient for transportation outreach to assist patient with scheduling an appointment.  [name] reached out the the patient about a month ago and patient has yet to call back and schedule a follow-up  Please advise

## 2016-03-24 NOTE — Telephone Encounter (Signed)
-----   Message from [name] sent at 02/28/2016  4:42 PM EDT ----- I called Tara Flores on 02/28/2016 at 4:43 to set up her follow up. She stated that I would have to call her son to see when he could bring her. Called the son on 02/28/2016 and he stated he would talk to his sister and see when she could bring her. They will call us back. [name] stated that she wants to come in but has no transportation to get here. Her son and his wife only have one car to share. ----- Message ----- From: [name], CMA Sent: 02/28/2016   2:35 PM To: [name]  Per Shanda BumpsJessica- Please call patient to reschedule 3 month follow-up that was cancelled on 02-10-16. Thanks

## 2016-03-25 NOTE — Telephone Encounter (Signed)
Referral is pending please associate with the appropriate diagnosis and sign. Thanks

## 2016-03-26 ENCOUNTER — Telehealth: Payer: Self-pay | Admitting: Nurse Practitioner

## 2016-03-26 NOTE — Telephone Encounter (Signed)
left msg for pt to call about community resources. VDM (dee-dee)

## 2016-04-10 ENCOUNTER — Telehealth: Payer: Self-pay

## 2016-04-10 NOTE — Telephone Encounter (Signed)
[facility]. Patient's legs look bad, there is redness, blisters, drainage. Called her back [phone], patient needs appt. She will call the family to let them know. We don't know how to treat it with seeing it.

## 2016-04-14 ENCOUNTER — Telehealth: Payer: Self-pay | Admitting: *Deleted

## 2016-04-14 NOTE — Telephone Encounter (Signed)
Can someone not bring her in today? Being seen today would probably be best.  Has a C3 referral been placed for this pt to help with transportation needs?

## 2016-04-14 NOTE — Telephone Encounter (Signed)
[name] with [facility] called and stated that patient's legs are reg with weeping. She thinks she has cellulitis. She placed Unna boots on both legs today and made an appointment for patient to come in tomorrow to see Dr. Council MechanicGreed. Patient is having a hard time finding a ride and hopes she will be able to make it. If someone can't bring her, she stated that she is going to try to. She really thinks she needs to be on Antibiotics.

## 2016-04-14 NOTE — Telephone Encounter (Signed)
She could not come in today that is why Home Health placed the [employer]. Home Health is looking into helping get her transportation [place] stated.

## 2016-04-14 NOTE — Telephone Encounter (Signed)
Noted  

## 2016-04-15 ENCOUNTER — Ambulatory Visit: Payer: Self-pay | Admitting: Internal Medicine

## 2016-04-16 ENCOUNTER — Telehealth: Payer: Self-pay

## 2016-04-16 ENCOUNTER — Encounter: Payer: Self-pay | Admitting: Nurse Practitioner

## 2016-04-16 ENCOUNTER — Ambulatory Visit (INDEPENDENT_AMBULATORY_CARE_PROVIDER_SITE_OTHER): Payer: Medicare Other | Admitting: Nurse Practitioner

## 2016-04-16 VITALS — BP 140/72 | HR 62 | Temp 97.8°F | Ht 63.0 in | Wt 141.8 lb

## 2016-04-16 DIAGNOSIS — I872 Venous insufficiency (chronic) (peripheral): Secondary | ICD-10-CM

## 2016-04-16 DIAGNOSIS — I83023 Varicose veins of left lower extremity with ulcer of ankle: Secondary | ICD-10-CM | POA: Diagnosis not present

## 2016-04-16 DIAGNOSIS — L989 Disorder of the skin and subcutaneous tissue, unspecified: Secondary | ICD-10-CM

## 2016-04-16 DIAGNOSIS — L97329 Non-pressure chronic ulcer of left ankle with unspecified severity: Secondary | ICD-10-CM

## 2016-04-16 MED ORDER — TRIAMCINOLONE ACETONIDE 0.025 % EX CREA: 1.0000 "application " | TOPICAL_CREAM | Freq: Two times a day (BID) | CUTANEOUS | 0 refills | Status: DC

## 2016-04-16 NOTE — Patient Instructions (Addendum)
Bring copy of Advance Directives- Health Care Power of Attorney and/or Living Will to next appointment.   To use triamcinolone cream with Eucerin lotion twice daily to dry flaky skin that is intact

## 2016-04-16 NOTE — Telephone Encounter (Signed)
I received a call from [place], home healthcare nurse, concerning patient's legs. She is wanting to know if patient is to continue using unaboot and can she get an order for the unaboots that she applies.   Please advise.

## 2016-04-16 NOTE — Progress Notes (Signed)
Careteam: Patient Care Team: Lauree Chandler, NP as PCP - General (Nurse Practitioner) Hayden Pedro, MD as Consulting Physician (Ophthalmology)  Advanced Directive information Does patient have an advance directive?: Yes, Type of Advance Directive: Living will, Does patient want to make changes to advanced directive?: No - Patient declined  Allergies  Allergen Reactions  . Keflex [Cephalexin] Nausea Only    Loss of appetite    Chief Complaint  Patient presents with  . Acute Visit    Bilateral leg concerns, Blister's on lower legs, no pain. Legs were evaluated by home health nurse and patient was advised to see PCP.     HPI: Patient is a 80 y.o. female seen in the office today due to blisters and weeping on legs. Currently legs are dry without any drainage. Pt feels like they are doing better however she can not see her legs due to visual deficits.  Pt reports Legs with redness and itching. She is most concerned over the itching. Has areas that have scabbed over.  No pain.   Previously at wound care center last year due to nonhealing sores. Unsure what type of sores she had but states they did not take long to heal once she started going.  ABIs done in April of last year which showed moderate occlusive arterial disease  Following with Dr Chalmers Cater for diabetes, adjusting medication Can not afford novolog but blood sugars have improved.  Pt also reports itching and drainage from nose.   Review of Systems:  Review of Systems  Constitutional: Negative for chills and fever.  HENT: Negative for congestion.   Eyes: Positive for visual disturbance. Negative for pain, discharge and redness.       Blind, uses walking stick  Respiratory: Negative for cough, shortness of breath and wheezing.        Smokes  Cardiovascular: Positive for leg swelling. Negative for chest pain and palpitations.  Gastrointestinal: Negative for abdominal distention, abdominal pain, constipation,  diarrhea and vomiting.  Genitourinary: Negative for dysuria.  Skin: Positive for color change (chronic redness to LE) and wound (to lower leg).  Neurological: Negative for dizziness and headaches.  Psychiatric/Behavioral: Dysphoric mood:     Past Medical History:  Diagnosis Date  . Anxiety   . Arthritis   . Asthma   . CAD (coronary artery disease)   . Cardiac disease   . COPD (chronic obstructive pulmonary disease) ([place])   . Diabetes mellitus without complication ([place])   . Hyperlipidemia   . Hypertension   . Hypothyroidism    Past Surgical History:  Procedure Laterality Date  . CARDIAC CATHETERIZATION N/A 02/05/2015   Procedure: Left Heart Cath and Coronary Angiography;  Surgeon: Burnell Blanks, MD;  Location: [facility];  Service: Cardiovascular;  Laterality: N/A;  . EYE SURGERY  2006   unsure if exact procedure  . HIP FRACTURE SURGERY    . INTRAMEDULLARY (IM) NAIL INTERTROCHANTERIC Right 02/06/2015   Procedure: INTRAMEDULLARY (IM) NAIL RIGHT HIP;  Surgeon: Leandrew Koyanagi, MD;  Location: [facility];  Service: Orthopedics;  Laterality: Right;  . JOINT REPLACEMENT     Social History:   reports that she has been smoking.  She has a 60.00 pack-year smoking history. She has never used smokeless tobacco. She reports that she drinks alcohol. She reports that she does not use drugs.  Family History  Problem Relation Age of Onset  . Alcohol abuse Father   . Cancer Mother     lung  .  Heart disease Mother   . Diabetes Sister   . Heart disease Sister     Medications: Patient's Medications  New Prescriptions   No medications on file  Previous Medications   ACCU-CHEK FASTCLIX LANCETS MISC    Check blood sugar 2-3 times daily DX E11.9   ACCU-CHEK SMARTVIEW TEST STRIP    Check blood sugar 2-3 times daily DX E11.9   ALBUTEROL (PROVENTIL HFA;VENTOLIN HFA) 108 (90 BASE) MCG/ACT INHALER    Inhale 1 puff into the lungs every 6 (six) hours as needed for wheezing or shortness of  breath. Reported on 09/06/2015   ALBUTEROL (PROVENTIL) (2.5 MG/3ML) 0.083% NEBULIZER SOLUTION    Take 3 mLs (2.5 mg total) by nebulization every 6 (six) hours as needed for wheezing or shortness of breath.   ASPIRIN 81 MG TABLET    Take 81 mg by mouth daily.   BLOOD GLUCOSE MONITORING SUPPL (ACCU-CHEK NANO SMARTVIEW) W/DEVICE KIT    Check blood sugar 2-3 times daily DX E11.9   CARVEDILOL (COREG) 6.25 MG TABLET    Take 1 tablet (6.25 mg total) by mouth 2 (two) times daily with a meal.   INSULIN NPH HUMAN (HUMULIN N,NOVOLIN N) 100 UNIT/ML INJECTION    INJECT 25 UNITS INTO THE SKIN EACH MORNING AND 12 UNITS INTO THE SKIN EACH NIGHT   LEVOTHYROXINE (SYNTHROID, LEVOTHROID) 137 MCG TABLET    Take 1 tablet (137 mcg total) by mouth daily.   LISINOPRIL (PRINIVIL,ZESTRIL) 10 MG TABLET    Take 1 tablet (10 mg total) by mouth daily.   MULTIPLE VITAMIN (MULTI VITAMIN PO)    Take 1 tablet by mouth daily. Reported on 09/06/2015   NITROGLYCERIN (NITROSTAT) 0.4 MG SL TABLET    Place 1 tablet (0.4 mg total) under the tongue every 5 (five) minutes as needed for chest pain.   OMEGA-3 FATTY ACIDS (OMEGA 3 PO)    Take 1 capsule by mouth daily. Reported on 09/06/2015  Modified Medications   No medications on file  Discontinued Medications   CHOLECALCIFEROL (VITAMIN D) 2000 UNITS TABLET    Take 2,000 Units by mouth daily.   HYDROCODONE-ACETAMINOPHEN (NORCO) 5-325 MG TABLET    Take 1 tablet by mouth every 6 (six) hours as needed for severe pain.     Physical Exam:  Vitals:   04/16/16 1302  BP: 140/72  Pulse: 62  Temp: 97.8 F (36.6 C)  TempSrc: Oral  SpO2: 97%  Weight: 141 lb 12.8 oz (64.3 kg)  Height: _0  (1.6 m)   Body mass index is 25.12 kg/m.  Physical Exam  Constitutional: She is oriented to person, place, and time. No distress.  Cardiovascular: Normal rate, regular rhythm, normal heart sounds and intact distal pulses.   Pulmonary/Chest: Effort normal. She has wheezes.  Abdominal: Soft. Bowel  sounds are normal. She exhibits no distension. There is no tenderness.  Musculoskeletal: Normal range of motion. She exhibits edema (2+ bilaterally). She exhibits no tenderness.  Uses walking stick for support/visual aide  Neurological: She is alert and oriented to person, place, and time.  Skin: Skin is warm and dry.   bilateral LE with light red color changes noted   No warmth noted Multiple open areas varying in size to medial and lateral aspect of left leg. Red and yellow center. No drainage at this time.   Psychiatric:  anxious    Labs reviewed: Basic Metabolic Panel:  Recent Labs  10/17/15 1550 12/24/15 2041  NA 139 134*  K 4.6 3.5  CL 97 100*  CO2 25 26  GLUCOSE 103* 89  BUN 16 10  CREATININE 0.56* 0.54  CALCIUM 9.3 9.2   Liver Function Tests:  Recent Labs  12/24/15 2041  AST 16  ALT 12*  ALKPHOS 81  BILITOT 0.8  PROT 7.1  ALBUMIN 3.6    Recent Labs  12/24/15 2117  LIPASE 20   No results for input(s): AMMONIA in the last 8760 hours. CBC:  Recent Labs  09/06/15 1307 12/24/15 2041  WBC 6.5 7.1  HGB 12.8 13.7  HCT 35.7* 40.0  MCV 92.0 95.7  PLT  --  251   Lipid Panel: No results for input(s): CHOL, HDL, LDLCALC, TRIG, CHOLHDL, LDLDIRECT in the last 8760 hours. TSH: No results for input(s): TSH in the last 8760 hours. A1C: Lab Results  Component Value Date   HGBA1C 8.9 (H) 10/17/2015     Assessment/Plan 1. Venous insufficiency of both lower extremities Scaling and dry itchy skin to both legs, to use  - triamcinolone (KENALOG) 0.025 % cream; Apply 1 application topically 2 (two) times daily MIXED with Eucerin lotion to INTACT skin.  To elevate LE to help with swelling   2. Venous ulcer of ankle, left ([place]) - Ambulatory referral to [place] called Lattie Haw with home health. -to culture open areas - Plan to use silvadene to open areas  And wrap with curlex twice weekly   3. Lesion of skin of face -discussed need to have lesion on face  evaluated by dermatology due to potentially being cancer. Pt reports she is unable to get transportation to appt however social worker involved and helping her to get transportation to appts.  - Ambulatory referral to Dermatology   After discussion with home health nurse, will have pt follow up in office in 1 week Total time 45 mins:  time greater than 50% of total time spent doing pt counseling and coordination of care   Eddye Broxterman K. Harle Battiest  [facility] [phone] 8 am - 5 pm) [phone] (after hours)

## 2016-04-16 NOTE — Telephone Encounter (Signed)
Spoke with Dr Chilton SiGreen, due to the fact pt will not be able to do dressing changes when home health nurse will not be there will change course.  Spoke with lisa and gave her orders to culture the wound To use silvadene to wounds and apply curlex dressing and change every 3 days.  [facility] to [facility]- please call pt and have her make appt with myself or Dr Chilton SiGreen for next week

## 2016-04-16 NOTE — Telephone Encounter (Signed)
Per Shanda BumpsJessica and inform her that patient needs santyl applied to legs.  I called Misty StanleyLisa with [facility] and they will not supply Santyl, patient needs to get rx at the pharmacy. Santyl is applied everyday and an oder will need to be given. Otherwise patient can have calcium aldernate, silver aldernate, collage, or aquacell applied to legs. These treatments are applied every 2-3 days (will need to write order).  Misty StanleyLisa also requested a verbal order for Child psychotherapistocial Worker, order given [facility] order  Please fax order to [phone]

## 2016-04-17 NOTE — Telephone Encounter (Signed)
I spoke with [name] and she will check with transportation to see if she can come in next week. Patient will call back to schedule if possible.

## 2016-04-17 NOTE — Telephone Encounter (Signed)
Spoke with Tara Flores and gave orders for kurlex, no unna boot

## 2016-04-23 ENCOUNTER — Telehealth: Payer: Self-pay | Admitting: Nurse Practitioner

## 2016-04-23 NOTE — Telephone Encounter (Signed)
FYI,   Called patient to give her the appointment information for the dermatology referral you ordered and she stated she already told us at her appointment that she was not going to see a dermatologist right now. She said she has too many other problems to worry about and she will take care of them 1 at a time. She is not going to go.

## 2016-04-29 ENCOUNTER — Telehealth: Payer: Self-pay

## 2016-04-29 NOTE — Telephone Encounter (Signed)
Misty StanleyLisa with [facility] called to get verbal orders for Skilled nursing 2 x weekly for 8 weeks then 1 x weekly for 1 week, with 3 visit if needed. Continuing wound care with Eucerin and triamcinolone.  Per [facility] standing order, verbal order given. Message will be sent to patient's provider as a FYI.

## 2016-04-30 ENCOUNTER — Ambulatory Visit: Payer: Medicare Other | Admitting: Nurse Practitioner

## 2016-04-30 ENCOUNTER — Ambulatory Visit (INDEPENDENT_AMBULATORY_CARE_PROVIDER_SITE_OTHER): Payer: Medicare Other | Admitting: Nurse Practitioner

## 2016-04-30 ENCOUNTER — Encounter: Payer: Self-pay | Admitting: Nurse Practitioner

## 2016-04-30 VITALS — BP 122/68 | HR 67 | Temp 98.1°F | Resp 17 | Ht 63.0 in | Wt 141.6 lb

## 2016-04-30 DIAGNOSIS — I83023 Varicose veins of left lower extremity with ulcer of ankle: Secondary | ICD-10-CM | POA: Diagnosis not present

## 2016-04-30 DIAGNOSIS — L97329 Non-pressure chronic ulcer of left ankle with unspecified severity: Principal | ICD-10-CM

## 2016-04-30 NOTE — Progress Notes (Signed)
Careteam: Patient Care Team: Lauree Chandler, NP as PCP - General (Nurse Practitioner) Hayden Pedro, MD as Consulting Physician (Ophthalmology)  Advanced Directive information Type of Advance Directive: Living will;Out of facility DNR (pink MOST or yellow form)  Allergies  Allergen Reactions  . Keflex [Cephalexin] Nausea Only    Loss of appetite    Chief Complaint  Patient presents with  . Medical Management of Chronic Issues    2 week follow up. Needs a new DNR.  Marland Kitchen Other    Does not want flu or pneumonia vaccines     HPI: Patient is a 80 y.o. female seen in the office today to follow up wound on left leg. Pt has been followed by home health nursing. Pt was seen 2 weeks ago due to venous wound on left leg. Home health ordered for wound culture and for silvadene to dress wound every 3 days.  Wound culture was not preformed but wounds have been healing. Now scabbed over. Using triamcinolone with Eucerin to intact tissue.  Itching has improved.    Review of Systems:  Review of Systems  Constitutional: Negative for chills and fever.  HENT: Negative for congestion.   Eyes: Positive for visual disturbance. Negative for pain, discharge and redness.       Blind, uses walking stick  Respiratory: Negative for cough, shortness of breath and wheezing.        Smokes  Cardiovascular: Positive for leg swelling. Negative for chest pain and palpitations.  Gastrointestinal: Negative for abdominal distention, abdominal pain, constipation, diarrhea and vomiting.  Genitourinary: Negative for dysuria.  Skin: Positive for color change (chronic redness to LE) and wound (to lower leg).  Neurological: Negative for dizziness and headaches.  Psychiatric/Behavioral: Dysphoric mood:     Past Medical History:  Diagnosis Date  . Anxiety   . Arthritis   . Asthma   . CAD (coronary artery disease)   . Cardiac disease   . COPD (chronic obstructive pulmonary disease) ([place])   . Diabetes  mellitus without complication ([place])   . Hyperlipidemia   . Hypertension   . Hypothyroidism    Past Surgical History:  Procedure Laterality Date  . CARDIAC CATHETERIZATION N/A 02/05/2015   Procedure: Left Heart Cath and Coronary Angiography;  Surgeon: Burnell Blanks, MD;  Location: [facility];  Service: Cardiovascular;  Laterality: N/A;  . EYE SURGERY  2006   unsure if exact procedure  . HIP FRACTURE SURGERY    . INTRAMEDULLARY (IM) NAIL INTERTROCHANTERIC Right 02/06/2015   Procedure: INTRAMEDULLARY (IM) NAIL RIGHT HIP;  Surgeon: Leandrew Koyanagi, MD;  Location: [facility];  Service: Orthopedics;  Laterality: Right;  . JOINT REPLACEMENT     Social History:   reports that she has been smoking.  She has a 60.00 pack-year smoking history. She has never used smokeless tobacco. She reports that she drinks alcohol. She reports that she does not use drugs.  Family History  Problem Relation Age of Onset  . Alcohol abuse Father   . Cancer Mother     lung  . Heart disease Mother   . Diabetes Sister   . Heart disease Sister     Medications: Patient's Medications  New Prescriptions   No medications on file  Previous Medications   ACCU-CHEK FASTCLIX LANCETS MISC    Check blood sugar 2-3 times daily DX E11.9   ACCU-CHEK SMARTVIEW TEST STRIP    Check blood sugar 2-3 times daily DX E11.9   ALBUTEROL (  PROVENTIL HFA;VENTOLIN HFA) 108 (90 BASE) MCG/ACT INHALER    Inhale 1 puff into the lungs every 6 (six) hours as needed for wheezing or shortness of breath. Reported on 09/06/2015   ALBUTEROL (PROVENTIL) (2.5 MG/3ML) 0.083% NEBULIZER SOLUTION    Take 3 mLs (2.5 mg total) by nebulization every 6 (six) hours as needed for wheezing or shortness of breath.   ASPIRIN 81 MG TABLET    Take 81 mg by mouth daily.   BLOOD GLUCOSE MONITORING SUPPL (ACCU-CHEK NANO SMARTVIEW) W/DEVICE KIT    Check blood sugar 2-3 times daily DX E11.9   CARVEDILOL (COREG) 6.25 MG TABLET    Take 1 tablet (6.25 mg total) by  mouth 2 (two) times daily with a meal.   INSULIN NPH HUMAN (HUMULIN N,NOVOLIN N) 100 UNIT/ML INJECTION    INJECT 25 UNITS INTO THE SKIN EACH MORNING AND 12 UNITS INTO THE SKIN EACH NIGHT   LEVOTHYROXINE (SYNTHROID, LEVOTHROID) 137 MCG TABLET    Take 1 tablet (137 mcg total) by mouth daily.   LISINOPRIL (PRINIVIL,ZESTRIL) 10 MG TABLET    Take 1 tablet (10 mg total) by mouth daily.   MULTIPLE VITAMIN (MULTI VITAMIN PO)    Take 1 tablet by mouth daily. Reported on 09/06/2015   NITROGLYCERIN (NITROSTAT) 0.4 MG SL TABLET    Place 1 tablet (0.4 mg total) under the tongue every 5 (five) minutes as needed for chest pain.   OMEGA-3 FATTY ACIDS (OMEGA 3 PO)    Take 1 capsule by mouth daily. Reported on 09/06/2015   TRIAMCINOLONE (KENALOG) 0.025 % CREAM    Apply 1 application topically 2 (two) times daily.  Modified Medications   No medications on file  Discontinued Medications   No medications on file     Physical Exam:  Vitals:   04/30/16 1320  BP: 122/68  Pulse: 67  Resp: 17  Temp: 98.1 F (36.7 C)  TempSrc: Oral  SpO2: 95%  Weight: 141 lb 9.6 oz (64.2 kg)  Height: 5' 3"  (1.6 m)   Body mass index is 25.08 kg/m.  Physical Exam  Constitutional: She is oriented to person, place, and time. No distress.  Cardiovascular: Normal rate, regular rhythm, normal heart sounds and intact distal pulses.   Pulmonary/Chest: Effort normal. She has wheezes.  Abdominal: Soft. Bowel sounds are normal. She exhibits no distension. There is no tenderness.  Musculoskeletal: Normal range of motion. She exhibits edema (2+ bilaterally). She exhibits no tenderness.  Uses walking stick for support/visual aide  Neurological: She is alert and oriented to person, place, and time.  Skin: Skin is warm and dry.   bilateral LE with light red color changes noted   No warmth noted Small open areas varying in size to medial aspect of left leg. Less in number, overall improving. No drainage at this time.   Psychiatric:    anxious    Labs reviewed: Basic Metabolic Panel:  Recent Labs  10/17/15 1550 12/24/15 2041  NA 139 134*  K 4.6 3.5  CL 97 100*  CO2 25 26  GLUCOSE 103* 89  BUN 16 10  CREATININE 0.56* 0.54  CALCIUM 9.3 9.2   Liver Function Tests:  Recent Labs  12/24/15 2041  AST 16  ALT 12*  ALKPHOS 81  BILITOT 0.8  PROT 7.1  ALBUMIN 3.6    Recent Labs  12/24/15 2117  LIPASE 20   No results for input(s): AMMONIA in the last 8760 hours. CBC:  Recent Labs  09/06/15 1307 12/24/15 2041  WBC 6.5 7.1  HGB 12.8 13.7  HCT 35.7* 40.0  MCV 92.0 95.7  PLT  --  251   Lipid Panel: No results for input(s): CHOL, HDL, LDLCALC, TRIG, CHOLHDL, LDLDIRECT in the last 8760 hours. TSH: No results for input(s): TSH in the last 8760 hours. A1C: Lab Results  Component Value Date   HGBA1C 8.9 (H) 10/17/2015     Assessment/Plan 1. Venous ulcer of ankle, left ([facility]) Overall improving. Will cont current treatment through home health.  -to stop triamcinolone and use Eucerin cream only to legs   -to notify office if worsens, drainage or warmth occurs.    Follow up in 2 weeks.   Carlos American. Harle Battiest  [facility] [phone] 8 am - 5 pm) [phone] (after hours)

## 2016-04-30 NOTE — Patient Instructions (Signed)
STOP triamcinolone and use EUCERIN cream only Cont to use silvadene twice weekly with curlex  Follow up in 2 weeks

## 2016-05-02 DIAGNOSIS — J449 Chronic obstructive pulmonary disease, unspecified: Secondary | ICD-10-CM | POA: Diagnosis not present

## 2016-05-02 DIAGNOSIS — L97811 Non-pressure chronic ulcer of other part of right lower leg limited to breakdown of skin: Secondary | ICD-10-CM

## 2016-05-02 DIAGNOSIS — I872 Venous insufficiency (chronic) (peripheral): Secondary | ICD-10-CM | POA: Diagnosis not present

## 2016-05-02 DIAGNOSIS — L97821 Non-pressure chronic ulcer of other part of left lower leg limited to breakdown of skin: Secondary | ICD-10-CM | POA: Diagnosis not present

## 2016-05-02 DIAGNOSIS — I10 Essential (primary) hypertension: Secondary | ICD-10-CM | POA: Diagnosis not present

## 2016-05-02 DIAGNOSIS — E11311 Type 2 diabetes mellitus with unspecified diabetic retinopathy with macular edema: Secondary | ICD-10-CM

## 2016-05-11 ENCOUNTER — Other Ambulatory Visit: Payer: Self-pay | Admitting: Cardiovascular Disease

## 2016-05-11 MED ORDER — LISINOPRIL 10 MG PO TABS: 10.0000 mg | ORAL_TABLET | Freq: Every day | ORAL | 0 refills | Status: DC

## 2016-05-26 ENCOUNTER — Other Ambulatory Visit: Payer: Self-pay | Admitting: Nurse Practitioner

## 2016-06-01 ENCOUNTER — Ambulatory Visit: Payer: Medicare Other | Admitting: Nurse Practitioner

## 2016-06-04 ENCOUNTER — Ambulatory Visit: Payer: Medicare Other | Admitting: Nurse Practitioner

## 2016-06-08 ENCOUNTER — Encounter: Payer: Self-pay | Admitting: Nurse Practitioner

## 2016-06-08 ENCOUNTER — Ambulatory Visit (INDEPENDENT_AMBULATORY_CARE_PROVIDER_SITE_OTHER): Payer: Medicare Other | Admitting: Nurse Practitioner

## 2016-06-08 VITALS — BP 126/60 | HR 64 | Temp 97.9°F | Resp 12 | Ht 63.0 in | Wt 142.0 lb

## 2016-06-08 DIAGNOSIS — E039 Hypothyroidism, unspecified: Secondary | ICD-10-CM

## 2016-06-08 DIAGNOSIS — I83028 Varicose veins of left lower extremity with ulcer other part of lower leg: Secondary | ICD-10-CM | POA: Diagnosis not present

## 2016-06-08 DIAGNOSIS — I10 Essential (primary) hypertension: Secondary | ICD-10-CM | POA: Diagnosis not present

## 2016-06-08 DIAGNOSIS — L97821 Non-pressure chronic ulcer of other part of left lower leg limited to breakdown of skin: Secondary | ICD-10-CM

## 2016-06-08 DIAGNOSIS — Z720 Tobacco use: Secondary | ICD-10-CM | POA: Diagnosis not present

## 2016-06-08 DIAGNOSIS — E118 Type 2 diabetes mellitus with unspecified complications: Secondary | ICD-10-CM

## 2016-06-08 DIAGNOSIS — Z794 Long term (current) use of insulin: Secondary | ICD-10-CM | POA: Diagnosis not present

## 2016-06-08 DIAGNOSIS — I872 Venous insufficiency (chronic) (peripheral): Secondary | ICD-10-CM

## 2016-06-08 LAB — CBC WITH DIFFERENTIAL/PLATELET
BASOS PCT: 1 %
Basophils Absolute: 71 cells/uL (ref 0–200)
EOS PCT: 8 %
Eosinophils Absolute: 568 cells/uL — ABNORMAL HIGH (ref 15–500)
HCT: 35 % (ref 35.0–45.0)
Hemoglobin: 11.7 g/dL (ref 11.7–15.5)
LYMPHS ABS: 1704 {cells}/uL (ref 850–3900)
LYMPHS PCT: 24 %
MCH: 31.9 pg (ref 27.0–33.0)
MCHC: 33.4 g/dL (ref 32.0–36.0)
MCV: 95.4 fL (ref 80.0–100.0)
MONO ABS: 710 {cells}/uL (ref 200–950)
MONOS PCT: 10 %
MPV: 10.9 fL (ref 7.5–12.5)
Neutro Abs: 4047 cells/uL (ref 1500–7800)
Neutrophils Relative %: 57 %
PLATELETS: 219 10*3/uL (ref 140–400)
RBC: 3.67 MIL/uL — ABNORMAL LOW (ref 3.80–5.10)
RDW: 13.5 % (ref 11.0–15.0)
WBC: 7.1 10*3/uL (ref 3.8–10.8)

## 2016-06-08 MED ORDER — TRIAMCINOLONE ACETONIDE 0.025 % EX CREA: 1.0000 "application " | TOPICAL_CREAM | Freq: Two times a day (BID) | CUTANEOUS | 0 refills | Status: DC

## 2016-06-08 NOTE — Patient Instructions (Addendum)
To cont silvadene to OPEN areas twice weekly  To use triamcinolone with eucerin cream to leg daily and wrap (loosely) Change dressing if wet  FOLLOW UP in 1 week! (tuesday afternoon ok-- or Thursday)   Stop Smoking

## 2016-06-08 NOTE — Progress Notes (Signed)
Careteam: Patient Care Team: Lauree Chandler, NP as PCP - General (Nurse Practitioner) Hayden Pedro, MD as Consulting Physician (Ophthalmology)  Advanced Directive information    Allergies  Allergen Reactions  . Keflex [Cephalexin] Nausea Only    Loss of appetite    Chief Complaint  Patient presents with  . Follow-up    2 week follow-up on leg swelling (no pain) , here with granddaughter Kayla   . Immunizations    Patient declined all recommended immunizations   . Health Maintenance    Patient declined all recommended screenings     HPI: Patient is a 80 y.o. female seen in the office today for 2 week follow up. (it has been over a month since seen last- missed 2 appts) Pt with venous ulcers on left leg. At last visit had improved but not resolved. Today they are worse. Currently using silvadene on whole leg. Legs are draining now which is new. Using hard loofah to scrub the leg every time she would shower and in between.  not elevating her leg.  No fevers or chills.  Will not go to wound care center, owes them money.  Right leg healing. No open area. Using eucerin.  conts to go to endocrinologist for blood sugars- had to cancel last appt because her ride cancelled on her.    Had GI bug last week. Doing better today.    Review of Systems:  Review of Systems  Constitutional: Negative for chills and fever.  HENT: Negative for congestion.   Eyes: Positive for visual disturbance (legally blind). Negative for pain, discharge and redness.       Blind, uses walking stick  Respiratory: Negative for cough, shortness of breath and wheezing.        Smokes  Cardiovascular: Positive for leg swelling. Negative for chest pain and palpitations.  Gastrointestinal: Negative for abdominal distention, abdominal pain, constipation, diarrhea and vomiting.  Genitourinary: Negative for dysuria.  Skin: Positive for color change (chronic redness to LE) and wound (to lower leg).    Neurological: Negative for dizziness and headaches.  Psychiatric/Behavioral: Dysphoric mood:     Past Medical History:  Diagnosis Date  . Anxiety   . Arthritis   . Asthma   . CAD (coronary artery disease)   . Cardiac disease   . COPD (chronic obstructive pulmonary disease) ([place])   . Diabetes mellitus without complication ([place])   . Hyperlipidemia   . Hypertension   . Hypothyroidism    Past Surgical History:  Procedure Laterality Date  . CARDIAC CATHETERIZATION N/A 02/05/2015   Procedure: Left Heart Cath and Coronary Angiography;  Surgeon: Burnell Blanks, MD;  Location: [place] [facility];  Service: Cardiovascular;  Laterality: N/A;  . EYE SURGERY  2006   unsure if exact procedure  . HIP FRACTURE SURGERY    . INTRAMEDULLARY (IM) NAIL INTERTROCHANTERIC Right 02/06/2015   Procedure: INTRAMEDULLARY (IM) NAIL RIGHT HIP;  Surgeon: Leandrew Koyanagi, MD;  Location: [place];  Service: Orthopedics;  Laterality: Right;  . JOINT REPLACEMENT     Social History:   reports that she has been smoking.  She has a 60.00 pack-year smoking history. She has never used smokeless tobacco. She reports that she drinks alcohol. She reports that she does not use drugs.  Family History  Problem Relation Age of Onset  . Alcohol abuse Father   . Cancer Mother     lung  . Heart disease Mother   . Diabetes Sister   .  Heart disease Sister     Medications: Patient's Medications  New Prescriptions   No medications on file  Previous Medications   ACCU-CHEK FASTCLIX LANCETS MISC    Check blood sugar 2-3 times daily DX E11.9   ACCU-CHEK SMARTVIEW TEST STRIP    Check blood sugar 2-3 times daily DX E11.9   ALBUTEROL (PROVENTIL HFA;VENTOLIN HFA) 108 (90 BASE) MCG/ACT INHALER    Inhale 1 puff into the lungs every 6 (six) hours as needed for wheezing or shortness of breath. Reported on 09/06/2015   ALBUTEROL (PROVENTIL) (2.5 MG/3ML) 0.083% NEBULIZER SOLUTION    USE 1 VIAL VIA NEBULIZER  EVERY 6 HOURS AS NEEDED  FOR WHEEZING OR SHORTNESS OF  BREATH.   ASPIRIN 81 MG TABLET    Take 81 mg by mouth daily.   BLOOD GLUCOSE MONITORING SUPPL (ACCU-CHEK NANO SMARTVIEW) W/DEVICE KIT    Check blood sugar 2-3 times daily DX E11.9   CARVEDILOL (COREG) 6.25 MG TABLET    Take 1 tablet (6.25 mg total) by mouth 2 (two) times daily with a meal.   INSULIN NPH HUMAN (HUMULIN N,NOVOLIN N) 100 UNIT/ML INJECTION    INJECT 25 UNITS INTO THE SKIN EACH MORNING AND 12 UNITS INTO THE SKIN EACH NIGHT   LEVOTHYROXINE (SYNTHROID, LEVOTHROID) 137 MCG TABLET    Take 1 tablet (137 mcg total) by mouth daily.   LISINOPRIL (PRINIVIL,ZESTRIL) 10 MG TABLET    Take 1 tablet (10 mg total) by mouth daily.   MULTIPLE VITAMIN (MULTI VITAMIN PO)    Take 1 tablet by mouth daily. Reported on 09/06/2015   NITROGLYCERIN (NITROSTAT) 0.4 MG SL TABLET    Place 1 tablet (0.4 mg total) under the tongue every 5 (five) minutes as needed for chest pain.   OMEGA-3 FATTY ACIDS (OMEGA 3 PO)    Take 1 capsule by mouth daily. Reported on 09/06/2015   TRIAMCINOLONE (KENALOG) 0.025 % CREAM    Apply 1 application topically 2 (two) times daily.  Modified Medications   No medications on file  Discontinued Medications   No medications on file     Physical Exam:  Vitals:   06/08/16 1443  BP: 126/60  Pulse: 64  Resp: 12  Temp: 97.9 F (36.6 C)  TempSrc: Oral  SpO2: 98%  Weight: 142 lb (64.4 kg)  Height: 5' 3"  (1.6 m)   Body mass index is 25.15 kg/m.  Physical Exam  Constitutional: She is oriented to person, place, and time. No distress.  Cardiovascular: Normal rate, regular rhythm and normal heart sounds.  Exam reveals decreased pulses.   Pulmonary/Chest: Effort normal. She has no wheezes.  Abdominal: Soft. Bowel sounds are normal. She exhibits no distension. There is no tenderness.  Musculoskeletal: Normal range of motion. She exhibits edema (2+ bilaterally). She exhibits no tenderness.  Uses walking stick for support/visual aide  Neurological: She is  alert and oriented to person, place, and time.  Skin: Skin is warm and dry.  bilateral LE with light red color changes noted   No warmth noted Small open areas varying in size in a linear pattern with irregular borders limited to breakdown of epidermis to lateral aspect of left leg with clear drainage.   Psychiatric:  anxious    Labs reviewed: Basic Metabolic Panel:  Recent Labs  10/17/15 1550 12/24/15 2041  NA 139 134*  K 4.6 3.5  CL 97 100*  CO2 25 26  GLUCOSE 103* 89  BUN 16 10  CREATININE 0.56* 0.54  CALCIUM 9.3 9.2  Liver Function Tests:  Recent Labs  12/24/15 2041  AST 16  ALT 12*  ALKPHOS 81  BILITOT 0.8  PROT 7.1  ALBUMIN 3.6    Recent Labs  12/24/15 2117  LIPASE 20   No results for input(s): AMMONIA in the last 8760 hours. CBC:  Recent Labs  09/06/15 1307 12/24/15 2041  WBC 6.5 7.1  HGB 12.8 13.7  HCT 35.7* 40.0  MCV 92.0 95.7  PLT  --  251   Lipid Panel: No results for input(s): CHOL, HDL, LDLCALC, TRIG, CHOLHDL, LDLDIRECT in the last 8760 hours. TSH: No results for input(s): TSH in the last 8760 hours. A1C: Lab Results  Component Value Date   HGBA1C 8.9 (H) 10/17/2015     Assessment/Plan 1. Venous stasis ulcer of other part of left lower leg limited to breakdown of skin with varicose veins (HCC) Has worsened today with increase drainage, suspect this is due to use of loofah that has opened skin. Pt is blind she was unaware of damage being done. Dr Mariea Clonts viewed ulcers.  Again offered wound care referral however she is adamant about not going. understands the risk of not following proper care instructions and keeping follow up appts.   Denies pain or tenderness. Educated not to use loofah, to clean with water and pat dry and then apply silvadene twice weekly to open areas To apply triamcinolone daily to intact skin with Eucerin.  Home health nursing to cont to monitor - triamcinolone (KENALOG) 0.025 % cream; Apply 1 application  topically 2 (two) times daily.  Dispense: 30 g; Refill: 0  2. Essential hypertension Blood pressure stable. Will cont current regimen.  - COMPLETE METABOLIC PANEL WITH GFR - CBC with Differential/Platelets  3. Type 2 diabetes mellitus with complication, with long-term current use of insulin (HCC) Diabetes followed by Dr Suzette Battiest, no hypoglycemic episodes   4. Hypothyroidism, unspecified type -conts on synthroid 137 mcg, will follow up - TSH  5. Tobacco abuse Smoking cessation.      Carlos American. Harle Battiest  [facility] [phone] 8 am - 5 pm) [phone] (after hours)

## 2016-06-09 LAB — COMPLETE METABOLIC PANEL WITH GFR
ALT: 16 U/L (ref 6–29)
AST: 24 U/L (ref 10–35)
Albumin: 3.1 g/dL — ABNORMAL LOW (ref 3.6–5.1)
Alkaline Phosphatase: 100 U/L (ref 33–130)
BUN: 20 mg/dL (ref 7–25)
CHLORIDE: 101 mmol/L (ref 98–110)
CO2: 28 mmol/L (ref 20–31)
Calcium: 8.1 mg/dL — ABNORMAL LOW (ref 8.6–10.4)
Creat: 0.86 mg/dL (ref 0.60–0.93)
GFR, Est African American: 75 mL/min (ref >=60)
GFR, Est Non African American: 65 mL/min (ref >=60)
GLUCOSE: 116 mg/dL — AB (ref 65–99)
POTASSIUM: 3.7 mmol/L (ref 3.5–5.3)
SODIUM: 135 mmol/L (ref 135–146)
Total Bilirubin: 0.3 mg/dL (ref 0.2–1.2)
Total Protein: 6.1 g/dL (ref 6.1–8.1)

## 2016-06-09 LAB — TSH: TSH: 18.04 m[IU]/L — AB

## 2016-06-16 ENCOUNTER — Ambulatory Visit (INDEPENDENT_AMBULATORY_CARE_PROVIDER_SITE_OTHER): Payer: Medicare Other | Admitting: Nurse Practitioner

## 2016-06-16 ENCOUNTER — Encounter: Payer: Self-pay | Admitting: Nurse Practitioner

## 2016-06-16 ENCOUNTER — Telehealth: Payer: Self-pay

## 2016-06-16 ENCOUNTER — Other Ambulatory Visit: Payer: Self-pay | Admitting: *Deleted

## 2016-06-16 VITALS — BP 132/64 | HR 76 | Temp 97.9°F | Resp 17 | Ht 63.0 in | Wt 146.0 lb

## 2016-06-16 DIAGNOSIS — I83028 Varicose veins of left lower extremity with ulcer other part of lower leg: Secondary | ICD-10-CM

## 2016-06-16 DIAGNOSIS — L97821 Non-pressure chronic ulcer of other part of left lower leg limited to breakdown of skin: Secondary | ICD-10-CM

## 2016-06-16 DIAGNOSIS — Z720 Tobacco use: Secondary | ICD-10-CM | POA: Diagnosis not present

## 2016-06-16 DIAGNOSIS — E039 Hypothyroidism, unspecified: Secondary | ICD-10-CM | POA: Diagnosis not present

## 2016-06-16 MED ORDER — ALBUTEROL SULFATE (2.5 MG/3ML) 0.083% IN NEBU: INHALATION_SOLUTION | RESPIRATORY_TRACT | 1 refills | Status: DC

## 2016-06-16 NOTE — Progress Notes (Signed)
Careteam: Patient Care Team: Lauree Chandler, NP as PCP - General (Nurse Practitioner) Hayden Pedro, MD as Consulting Physician (Ophthalmology) Jacelyn Pi, MD as Consulting Physician (Endocrinology)  Advanced Directive information Does Patient Have a Medical Advance Directive?: No  Allergies  Allergen Reactions  . Keflex [Cephalexin] Nausea Only    Loss of appetite    Chief Complaint  Patient presents with  . Medical Management of Chronic Issues    8 day follow up on leg swelling     HPI: Patient is a 81 y.o. female seen in the office today to follow up venous ulcers to left leg. Home health has been following and increase dressing changes to 3 times a week. Using silvadene three times weekly. Pt changing gauge if they become wet. Sliding down a lot of the time.  Eating well. Trying to eat foods higher in nutritional value vs "junk" TSH elevated with recent labs but she had not been taking her synthroid. Has an appt with Dr Debbora Presto Feb 1st and she will recheck lab work.  Review of Systems:  Review of Systems  Constitutional: Negative for chills and fever.  HENT: Negative for congestion.   Eyes: Positive for visual disturbance (legally blind). Negative for pain, discharge and redness.       Blind, uses walking stick  Respiratory: Negative for cough, shortness of breath and wheezing.        Smokes  Cardiovascular: Positive for leg swelling. Negative for chest pain and palpitations.  Skin: Positive for color change (chronic redness to LE) and wound (to lower leg).  Psychiatric/Behavioral: Dysphoric mood:     Past Medical History:  Diagnosis Date  . Anxiety   . Arthritis   . Asthma   . CAD (coronary artery disease)   . Cardiac disease   . COPD (chronic obstructive pulmonary disease) ([place])   . Diabetes mellitus without complication ([place])   . Hyperlipidemia   . Hypertension   . Hypothyroidism    Past Surgical History:  Procedure Laterality Date  . CARDIAC  CATHETERIZATION N/A 02/05/2015   Procedure: Left Heart Cath and Coronary Angiography;  Surgeon: Burnell Blanks, MD;  Location: [place] [facility];  Service: Cardiovascular;  Laterality: N/A;  . EYE SURGERY  2006   unsure if exact procedure  . HIP FRACTURE SURGERY    . INTRAMEDULLARY (IM) NAIL INTERTROCHANTERIC Right 02/06/2015   Procedure: INTRAMEDULLARY (IM) NAIL RIGHT HIP;  Surgeon: Leandrew Koyanagi, MD;  Location: [place];  Service: Orthopedics;  Laterality: Right;  . JOINT REPLACEMENT     Social History:   reports that she has been smoking.  She has a 60.00 pack-year smoking history. She has never used smokeless tobacco. She reports that she drinks alcohol. She reports that she does not use drugs.  Family History  Problem Relation Age of Onset  . Alcohol abuse Father   . Cancer Mother     lung  . Heart disease Mother   . Diabetes Sister   . Heart disease Sister     Medications: Patient's Medications  New Prescriptions   No medications on file  Previous Medications   ACCU-CHEK FASTCLIX LANCETS MISC    Check blood sugar 2-3 times daily DX E11.9   ACCU-CHEK SMARTVIEW TEST STRIP    Check blood sugar 2-3 times daily DX E11.9   ALBUTEROL (PROVENTIL HFA;VENTOLIN HFA) 108 (90 BASE) MCG/ACT INHALER    Inhale 1 puff into the lungs every 6 (six) hours as needed for  wheezing or shortness of breath. Reported on 09/06/2015   ALBUTEROL (PROVENTIL) (2.5 MG/3ML) 0.083% NEBULIZER SOLUTION    2.55m per 356mevery 6 hours as needed for shortness of breath/wheezing   ASPIRIN 81 MG TABLET    Take 81 mg by mouth daily.   BLOOD GLUCOSE MONITORING SUPPL (ACCU-CHEK NANO SMARTVIEW) W/DEVICE KIT    Check blood sugar 2-3 times daily DX E11.9   CARVEDILOL (COREG) 6.25 MG TABLET    Take 1 tablet (6.25 mg total) by mouth 2 (two) times daily with a meal.   INSULIN NPH HUMAN (HUMULIN N,NOVOLIN N) 100 UNIT/ML INJECTION    INJECT 25 UNITS INTO THE SKIN EACH MORNING AND 12 UNITS INTO THE SKIN EACH NIGHT    LEVOTHYROXINE (SYNTHROID, LEVOTHROID) 137 MCG TABLET    Take 1 tablet (137 mcg total) by mouth daily.   LISINOPRIL (PRINIVIL,ZESTRIL) 10 MG TABLET    Take 1 tablet (10 mg total) by mouth daily.   MULTIPLE VITAMIN (MULTI VITAMIN PO)    Take 1 tablet by mouth daily. Reported on 09/06/2015   NITROGLYCERIN (NITROSTAT) 0.4 MG SL TABLET    Place 1 tablet (0.4 mg total) under the tongue every 5 (five) minutes as needed for chest pain.   OMEGA-3 FATTY ACIDS (OMEGA 3 PO)    Take 1 capsule by mouth daily. Reported on 09/06/2015   TRIAMCINOLONE (KENALOG) 0.025 % CREAM    Apply 1 application topically 2 (two) times daily.  Modified Medications   No medications on file  Discontinued Medications   No medications on file     Physical Exam:  Vitals:   06/16/16 1303  BP: 132/64  Pulse: 76  Resp: 17  Temp: 97.9 F (36.6 C)  TempSrc: Oral  SpO2: 97%  Weight: 146 lb (66.2 kg)  Height: _0  (1.6 m)   Body mass index is 25.86 kg/m.  Physical Exam  Constitutional: She is oriented to person, place, and time. No distress.  Cardiovascular: Normal rate, regular rhythm and normal heart sounds.  Exam reveals decreased pulses.   Pulmonary/Chest: Effort normal.  Abdominal: Soft. Bowel sounds are normal. She exhibits no distension. There is no tenderness.  Musculoskeletal: Normal range of motion. She exhibits edema (2+ bilaterally). She exhibits no tenderness.  Uses walking stick for support/visual aide  Neurological: She is alert and oriented to person, place, and time.  Skin: Skin is warm and dry.  bilateral LE with light red color changes noted   No warmth noted Small open areas varying in size with irregular borders limited to breakdown of epidermis to lateral aspect of left leg with clear drainage, unchanged from last visit, now with areas to medial aspect as well.     Labs reviewed: Basic Metabolic Panel:  Recent Labs  10/17/15 1550 12/24/15 2041 06/08/16 1533  NA 139 134* 135  K 4.6 3.5  3.7  CL 97 100* 101  CO2 _1 GLUCOSE 103* 89 116*  BUN _2 CREATININE 0.56* 0.54 0.86  CALCIUM 9.3 9.2 8.1*  TSH  --   --  18.04*   Liver Function Tests:  Recent Labs  12/24/15 2041 06/08/16 1533  AST 16 24  ALT 12* 16  ALKPHOS 81 100  BILITOT 0.8 0.3  PROT 7.1 6.1  ALBUMIN 3.6 3.1*    Recent Labs  12/24/15 2117  LIPASE 20   No results for input(s): AMMONIA in the last 8760 hours. CBC:  Recent Labs  09/06/15 1307 12/24/15 2041 06/08/16  1533  WBC 6.5 7.1 7.1  NEUTROABS  --   --  4,047  HGB 12.8 13.7 11.7  HCT 35.7* 40.0 35.0  MCV 92.0 95.7 95.4  PLT  --  251 219   Lipid Panel: No results for input(s): CHOL, HDL, LDLCALC, TRIG, CHOLHDL, LDLDIRECT in the last 8760 hours. TSH:  Recent Labs  06/08/16 1533  TSH 18.04*   A1C: Lab Results  Component Value Date   HGBA1C 8.9 (H) 10/17/2015     Assessment/Plan  1. Venous stasis ulcer of other part of left lower leg limited to breakdown of skin with varicose veins (HCC) -Dr Nyoka Cowden in on visit today -recommended to change treatment to Xeroform then to wrap with gauge three times weekly with nurse visits -educated pt again on risk of venous statis ulcers and further breakdown without proper follow up and treatment (further deterioration of wounds/skin breakdown with risk of amputation) pt aware -follow up in 1 week or sooner if needed, nursing to monitor as well.   2. Tobacco abuse -strongly encouraged cessation due to venous statis ulcers but pt states she will not stop smoking states "I will only live so long, so I will smoke as long as I can"  3. Hypothyroid -currently taking medication as prescribed, had not been taking medication when TSH was drawn and therefore TSH elevated. Following with Endocrinologist in Feb and will have her recheck.  Carlos American. Harle Battiest  [facility] [phone] 8 am - 5 pm) [phone] (after hours)

## 2016-06-16 NOTE — Patient Instructions (Addendum)
STOP silvadene  Start using xeroform then wrap with gauze   Follow up in 1 week for wound check

## 2016-06-16 NOTE — Telephone Encounter (Signed)
Received fax from [facility] to clarify Albuterol inhaled neb vials. Per [name]--Albuterol inhaled 0.083% 2.5mg  per 3ml every 6 hours as needed for shortness of breath/wheezing #90 day supply/1RF. [facility]

## 2016-06-16 NOTE — Telephone Encounter (Signed)
I called Misty StanleyLisa to inform her of Jessica's request that Xeroform and rolled gauze be used on patient's open wounds 3 times a week.   Misty StanleyLisa verbalized understanding and stated that she would get the order placed with [facility].

## 2016-06-25 ENCOUNTER — Encounter: Payer: Self-pay | Admitting: Nurse Practitioner

## 2016-06-25 ENCOUNTER — Telehealth: Payer: Self-pay

## 2016-06-25 ENCOUNTER — Ambulatory Visit (INDEPENDENT_AMBULATORY_CARE_PROVIDER_SITE_OTHER): Payer: Medicare Other | Admitting: Nurse Practitioner

## 2016-06-25 ENCOUNTER — Ambulatory Visit: Payer: Medicare Other

## 2016-06-25 VITALS — BP 134/72 | HR 67 | Temp 98.2°F | Resp 17 | Ht 63.0 in | Wt 144.0 lb

## 2016-06-25 DIAGNOSIS — I83028 Varicose veins of left lower extremity with ulcer other part of lower leg: Secondary | ICD-10-CM

## 2016-06-25 DIAGNOSIS — L97821 Non-pressure chronic ulcer of other part of left lower leg limited to breakdown of skin: Secondary | ICD-10-CM

## 2016-06-25 NOTE — Patient Instructions (Addendum)
Xeroform - ABD Pads - Wrap with Kerlix   Do not leave your leg uncovered. If your dressing becomes saturated change the ABD pads and re-wrap the leg with the Kerlix.   Follow up in a week an a half with Shanda BumpsJessica or Dr Chilton SiGreen Tuesday the 16th in the afternoon   Venous Ulcer Introduction A venous ulcer is a shallow sore on your lower leg that is caused by poor circulation in your veins. This condition used to be called stasis ulcer. Veins have valves that help return blood to the heart. If these valves do not work properly, it can cause blood to flow backward and to back up into the veins near the skin. When that happens, blood can pool in your lower legs. The blood can then leak out of your veins, which can irritate your skin. This may cause a break in your skin that becomes a venous ulcer. Venous ulcer is the most common type of lower leg ulcer. You may have venous ulcers on one leg or on both legs. The area where this condition most commonly develops is around the ankles. A venous ulcer may last for a long time (chronic ulcer) or it may return repeatedly (recurrent ulcer). What are the causes? Any condition that causes poor circulation to your legs can lead to a venous ulcer. What increases the risk? This condition is more likely to develop in:  People who are 79 years of age or older.  People who are overweight.  People who are not active.  People who have had a leg ulcer in the past.  People who have clots in their lower leg veins (deep vein thrombosis).  People who have inflammation of their leg veins (phlebitis).  Women who have given birth.  People who smoke. What are the signs or symptoms? The most common symptom of this condition is an open sore near your ankle. Other symptoms may include:  Swelling.  Thickening of the skin.  Fluid leaking from the ulcer.  Bleeding.  Itching.  Pain and swelling that gets worse when you stand up and feels better when you raise your  leg.  Blotchy skin.  Darkening of the skin. How is this diagnosed? Your health care provider may suspect a venous ulcer based on your medical history and your risk factors. Your health care provider will check the skin on your legs. Other tests may be done to learn more about the ulcer and to determine the best way to treat it. Tests that may be done include:  Measuring the blood pressure in your arms and legs.  Using sound waves (ultrasound) to measure the blood flow in your leg veins. How is this treated? You may need to try several different types of treatment to get your venous ulcer to heal. Healing may take a long time. Treatment may include:  Keeping your leg raised (elevated).  Wearing a type of bandage or stocking to compress the veins of your leg (compression therapy). Venous wounds are not likely to heal or to stay healed without compression.  Taking medicines to improve blood flow.  Taking antibiotic medicines to treat infection.  Cleaning your ulcer and removing any dead tissue from the wound (debridement).  Placing various types of medicated bandage (dressings) or medicated wraps on your ulcer. This helps the ulcer to heal and helps to prevent infection. Surgery is sometimes needed to close the wound using a piece of skin taken from another area of your body (graft). You may need  surgery if other treatments are not working or if your ulcer is very deep. Follow these instructions at home: Wound care  Follow instructions from your health care provider about:  How to take care of your wound.  When and how you should change your bandage (dressing).  When you should remove your dressing. If your dressing is dry and sticks to your leg when you try to remove it, moisten or wet the dressing with saline solution or water so that the dressing can be removed without harming your skin or wound tissue.  Check your wound every day for signs of infection. Have a caregiver do this  for you if you are not able to do it yourself. Check for:  More redness, swelling, or pain.  More fluid or blood.  Pus, warmth, or a bad smell. Medicines  Take over-the-counter and prescription medicines only as told by your health care provider.  If you were prescribed an antibiotic medicine, take it or apply it as told by your health care provider. Do not stop taking or using the antibiotic even if your condition improves. Activity  Do not stand or sit in one position for a long period of time. Rest with your legs raised during the day. If possible, keep your legs above your heart for 30 minutes, 3-4 times a day, or as told by your health care provider.  Do not sit with your legs crossed.  Walk often to increase the blood flow in your legs.Ask your health care provider what level of activity is safe for you.  If you are taking a long ride in a car or plane, take a break to walk around at least once every two hours, or as often as your health care provider recommends. Ask your health care provider if you should take aspirin before long trips. General instructions  Wear elastic stockings, compression stockings, or support hose as told by your health care provider. This is very important.  Raise the foot of your bed as told by your health care provider.  Do not smoke.  Keep all follow-up visits as told by your health care provider. This is important. Contact a health care provider if:  You have a fever.  Your ulcer is getting larger or is not healing.  Your pain gets worse.  You have more redness or swelling around your ulcer.  You have more fluid, blood, or pus coming from your ulcer after it has been cleaned by you or your health care provider.  You have warmth or a bad smell coming from your ulcer. This information is not intended to replace advice given to you by your health care provider. Make sure you discuss any questions you have with your health care  provider. Document Released: 03/03/2001 Document Revised: 11/14/2015 Document Reviewed: 10/17/2014  2017 [employer]vier

## 2016-06-25 NOTE — Telephone Encounter (Signed)
I called Misty StanleyLisa with [facility] to verify that xeroform is being used on patient's leg wound and to see if home health could order and use ABD pads on patient's leg.   Shanda BumpsJessica would like to have the xeroform used first and then have ABD pads wrapped around leg. The leg should then be wrapped in Kerlix. The goal is to keep the wound dry and help prevent slippage of dressing so that patient keeps dressings on.   Left message for Misty StanleyLisa to call the office.

## 2016-06-25 NOTE — Progress Notes (Signed)
Careteam: Patient Care Team: Lauree Chandler, NP as PCP - General (Nurse Practitioner) Hayden Pedro, MD as Consulting Physician (Ophthalmology) Jacelyn Pi, MD as Consulting Physician (Endocrinology)  Advanced Directive information Does Patient Have a Medical Advance Directive?: No  Allergies  Allergen Reactions  . Keflex [Cephalexin] Nausea Only    Loss of appetite    Chief Complaint  Patient presents with  . Medical Management of Chronic Issues    1 week wound check     HPI: Patient is a 81 y.o. female seen in the office today for a wound check of venous stasis ulcers. At last visit on 12/26 the treatment was changed to Xeroform to be wrapped with gauze three times weekly by the home health nurse. The patient reports the Xeroform just came yesterday and that prior to that the home health nurse was using what she had. She is unsure if she was using her own Xeroform or a cream, she thinks it was a cream. The left leg weeps so much that the patient has to remove the dressing either the same day or the next morning after being applied as it is hanging down and so wet you could wring it out. The patient feels like she could wrap the leg herself on the days the home health nurse does not come, she just wouldn't be able to see where the wounds are located and what they were looking like. The dressing was last changed 2 days ago, the patient removed the dressing that evening as it was soaked. She has not had a dressing on since that time.   Review of Systems:  Review of Systems  Constitutional: Negative for chills and fever.  HENT: Negative for congestion.   Eyes: Positive for visual disturbance (legally blind). Negative for pain, discharge and redness.       Blind, uses walking stick  Respiratory: Negative for cough, shortness of breath and wheezing.        Smokes  Cardiovascular: Positive for leg swelling. Negative for chest pain and palpitations.  Skin: Positive for color  change (chronic redness to LE) and wound (to left lower leg).  Psychiatric/Behavioral: Dysphoric mood:    Past Medical History:  Diagnosis Date  . Anxiety   . Arthritis   . Asthma   . CAD (coronary artery disease)   . Cardiac disease   . COPD (chronic obstructive pulmonary disease) ([place])   . Diabetes mellitus without complication ([place])   . Hyperlipidemia   . Hypertension   . Hypothyroidism    Past Surgical History:  Procedure Laterality Date  . CARDIAC CATHETERIZATION N/A 02/05/2015   Procedure: Left Heart Cath and Coronary Angiography;  Surgeon: Burnell Blanks, MD;  Location: [facility];  Service: Cardiovascular;  Laterality: N/A;  . EYE SURGERY  2006   unsure if exact procedure  . HIP FRACTURE SURGERY    . INTRAMEDULLARY (IM) NAIL INTERTROCHANTERIC Right 02/06/2015   Procedure: INTRAMEDULLARY (IM) NAIL RIGHT HIP;  Surgeon: Leandrew Koyanagi, MD;  Location: [facility];  Service: Orthopedics;  Laterality: Right;  . JOINT REPLACEMENT     Social History:   reports that she has been smoking.  She has a 60.00 pack-year smoking history. She has never used smokeless tobacco. She reports that she drinks alcohol. She reports that she does not use drugs.  Family History  Problem Relation Age of Onset  . Alcohol abuse Father   . Cancer Mother     lung  .  Heart disease Mother   . Diabetes Sister   . Heart disease Sister     Medications: Patient's Medications  New Prescriptions   No medications on file  Previous Medications   ACCU-CHEK FASTCLIX LANCETS MISC    Check blood sugar 2-3 times daily DX E11.9   ACCU-CHEK SMARTVIEW TEST STRIP    Check blood sugar 2-3 times daily DX E11.9   ALBUTEROL (PROVENTIL HFA;VENTOLIN HFA) 108 (90 BASE) MCG/ACT INHALER    Inhale 1 puff into the lungs every 6 (six) hours as needed for wheezing or shortness of breath. Reported on 09/06/2015   ALBUTEROL (PROVENTIL) (2.5 MG/3ML) 0.083% NEBULIZER SOLUTION    2.91m per 328mevery 6 hours as needed for  shortness of breath/wheezing   ASPIRIN 81 MG TABLET    Take 81 mg by mouth daily.   BLOOD GLUCOSE MONITORING SUPPL (ACCU-CHEK NANO SMARTVIEW) W/DEVICE KIT    Check blood sugar 2-3 times daily DX E11.9   CARVEDILOL (COREG) 6.25 MG TABLET    Take 1 tablet (6.25 mg total) by mouth 2 (two) times daily with a meal.   INSULIN NPH HUMAN (HUMULIN N,NOVOLIN N) 100 UNIT/ML INJECTION    INJECT 25 UNITS INTO THE SKIN EACH MORNING AND 12 UNITS INTO THE SKIN EACH NIGHT   LEVOTHYROXINE (SYNTHROID, LEVOTHROID) 137 MCG TABLET    Take 1 tablet (137 mcg total) by mouth daily.   LISINOPRIL (PRINIVIL,ZESTRIL) 10 MG TABLET    Take 1 tablet (10 mg total) by mouth daily.   MULTIPLE VITAMIN (MULTI VITAMIN PO)    Take 1 tablet by mouth daily. Reported on 09/06/2015   NITROGLYCERIN (NITROSTAT) 0.4 MG SL TABLET    Place 1 tablet (0.4 mg total) under the tongue every 5 (five) minutes as needed for chest pain.   OMEGA-3 FATTY ACIDS (OMEGA 3 PO)    Take 1 capsule by mouth daily. Reported on 09/06/2015  Modified Medications   No medications on file  Discontinued Medications   No medications on file     Physical Exam:  Vitals:   06/25/16 1405  BP: 134/72  Pulse: 67  Resp: 17  Temp: 98.2 F (36.8 C)  TempSrc: Oral  SpO2: 98%  Weight: 144 lb (65.3 kg)  Height: 5' 3"  (1.6 m)   Body mass index is 25.51 kg/m.  Physical Exam  Constitutional: She is oriented to person, place, and time. She appears well-developed. No distress.  Cardiovascular: Normal rate, regular rhythm and normal heart sounds.  Exam reveals decreased pulses.   Pulmonary/Chest: Effort normal.  Abdominal: Soft. Bowel sounds are normal. She exhibits no distension. There is no tenderness.  Musculoskeletal: Normal range of motion. She exhibits edema (2+ bilaterally). She exhibits no tenderness.  Uses walking stick for support/visual aide  Neurological: She is alert and oriented to person, place, and time.  Skin: Skin is warm and dry.  BLE with light  red color changes noted. No warmth noted. Small open areas varying in size with irregular borders limited to breakdown of epidermis to lateral and medial aspects of left leg. Increased weeping, sock saturated.  Maceration present.    Labs reviewed: Basic Metabolic Panel:  Recent Labs  10/17/15 1550 12/24/15 2041 06/08/16 1533  NA 139 134* 135  K 4.6 3.5 3.7  CL 97 100* 101  CO2 25 26 28   GLUCOSE 103* 89 116*  BUN 16 10 20   CREATININE 0.56* 0.54 0.86  CALCIUM 9.3 9.2 8.1*  TSH  --   --  18.04*  Liver Function Tests:  Recent Labs  12/24/15 2041 06/08/16 1533  AST 16 24  ALT 12* 16  ALKPHOS 81 100  BILITOT 0.8 0.3  PROT 7.1 6.1  ALBUMIN 3.6 3.1*    Recent Labs  12/24/15 2117  LIPASE 20   No results for input(s): AMMONIA in the last 8760 hours. CBC:  Recent Labs  09/06/15 1307 12/24/15 2041 06/08/16 1533  WBC 6.5 7.1 7.1  NEUTROABS  --   --  4,047  HGB 12.8 13.7 11.7  HCT 35.7* 40.0 35.0  MCV 92.0 95.7 95.4  PLT  --  251 219   Lipid Panel: No results for input(s): CHOL, HDL, LDLCALC, TRIG, CHOLHDL, LDLDIRECT in the last 8760 hours. TSH:  Recent Labs  06/08/16 1533  TSH 18.04*   A1C: Lab Results  Component Value Date   HGBA1C 8.9 (H) 10/17/2015     Assessment/Plan 1. Venous stasis ulcer of other part of left lower leg limited to breakdown of skin with varicose veins (HCC) - No improvement from one week ago. weeping and maceration present.  - Treatment = xeroform - ABD Pads - Kerlix; called to home health company by [place]. - Patient will attempt to change the ABD pads on days a home health nurse is not available or when it becomes saturated.  - Advised to only let water run over the leg, not to clean with soap or scrub the wounds.  - Educated about the risks of leaving a saturated dressing on the skin and about leaving the wounds open with no dressing. Patient verbalized understanding that if she does not take proper care of this wound it  could lead to amputation. Again encouraged her to go to the wound care center. She is not willing to go to the wound center.  - New dressing (gauze & Kerlix) applied prior to leaving office today, pt to apply xeroform and gauge once she gets home.    Follow-up on 11/16   Greysin Medlen K. Harle Battiest  [facility] [phone] 8 am - 5 pm) [phone] (after hours)

## 2016-06-30 ENCOUNTER — Telehealth: Payer: Self-pay | Admitting: *Deleted

## 2016-06-30 NOTE — Telephone Encounter (Signed)
Notified Tara Flores with [facility] and she agreed. Patient would like to wait until an appointment with Shanda BumpsJessica before referral because she has such a hard time finding a ride. Tara Flores asked for us to call patient towards the end of the day to schedule an appointment because patient was going to try and line up a ride during the day today.

## 2016-06-30 NOTE — Telephone Encounter (Signed)
Are they using abd pads to help absorb moisture? Then use curlex to help secure  We should also send to vein and vascular to see if they can help us out with the management of these wounds.. She if she would be willing to go there. Thanks.

## 2016-06-30 NOTE — Telephone Encounter (Signed)
Tara StanleyLisa with [facility] at [facility] called and stated that patient's legs (bilateral) are weeping, redness up to the knee and covered in fluid. Not warm to touch. No fever. The pads and Xerofoam is getting saturated and they have to take them off. I offered patient an appointment for today/ this week and she stated that she has no ride to come in to be evaluated. Nurse will be at patient's home for the next hour. Please Advise.

## 2016-07-01 DIAGNOSIS — I10 Essential (primary) hypertension: Secondary | ICD-10-CM | POA: Diagnosis not present

## 2016-07-01 DIAGNOSIS — J449 Chronic obstructive pulmonary disease, unspecified: Secondary | ICD-10-CM | POA: Diagnosis not present

## 2016-07-01 DIAGNOSIS — I2510 Atherosclerotic heart disease of native coronary artery without angina pectoris: Secondary | ICD-10-CM | POA: Diagnosis not present

## 2016-07-01 DIAGNOSIS — I872 Venous insufficiency (chronic) (peripheral): Secondary | ICD-10-CM | POA: Diagnosis not present

## 2016-07-01 DIAGNOSIS — E11311 Type 2 diabetes mellitus with unspecified diabetic retinopathy with macular edema: Secondary | ICD-10-CM | POA: Diagnosis not present

## 2016-07-01 DIAGNOSIS — L97821 Non-pressure chronic ulcer of other part of left lower leg limited to breakdown of skin: Secondary | ICD-10-CM | POA: Diagnosis not present

## 2016-07-02 ENCOUNTER — Encounter: Payer: Self-pay | Admitting: Nurse Practitioner

## 2016-07-02 ENCOUNTER — Ambulatory Visit (INDEPENDENT_AMBULATORY_CARE_PROVIDER_SITE_OTHER): Payer: Medicare Other | Admitting: Nurse Practitioner

## 2016-07-02 VITALS — BP 132/80 | HR 63 | Temp 97.5°F | Resp 18 | Ht 63.0 in | Wt 142.2 lb

## 2016-07-02 DIAGNOSIS — L03116 Cellulitis of left lower limb: Secondary | ICD-10-CM

## 2016-07-02 DIAGNOSIS — I83028 Varicose veins of left lower extremity with ulcer other part of lower leg: Secondary | ICD-10-CM

## 2016-07-02 DIAGNOSIS — L97821 Non-pressure chronic ulcer of other part of left lower leg limited to breakdown of skin: Secondary | ICD-10-CM

## 2016-07-02 MED ORDER — DOXYCYCLINE HYCLATE 100 MG PO TABS: 100.0000 mg | ORAL_TABLET | Freq: Two times a day (BID) | ORAL | 0 refills | Status: DC

## 2016-07-02 NOTE — Patient Instructions (Addendum)
Continue dressing changes with home health and as needed in-between to prevent keeping a wet dressing on your leg.  Elevate your legs as much as possible.  Take you antibiotics with food to prevent stomach upset.   Take ALL antibiotics until completed.

## 2016-07-02 NOTE — Progress Notes (Signed)
Careteam: Patient Care Team: Lauree Chandler, NP as PCP - General (Nurse Practitioner) Hayden Pedro, MD as Consulting Physician (Ophthalmology) Jacelyn Pi, MD as Consulting Physician (Endocrinology)  Advanced Directive information Does Patient Have a Medical Advance Directive?: No  Allergies  Allergen Reactions  . Keflex [Cephalexin] Nausea Only    Loss of appetite    Chief Complaint  Patient presents with  . Acute Visit    Wound on left leg has worsened.      HPI: Patient is a 81 y.o. female seen in the office today due to worsening left leg wound per her home health nurse. The patient reports when the nurse came 2 days ago she was very upset by the amount of drainage from the legs as the pads were saturated. The patient reports she has started elevating her legs the past two days and has seen a significant decrease in the amount of drainage from her left leg. She also reports her left leg is typically very tight and hard and since she has been elevating them that they are softer and not firm.  Pt is blind and can not see legs.  Review of Systems:  Review of Systems  Constitutional: Negative for chills and fever.  HENT: Negative for congestion.   Eyes: Positive for visual disturbance (legally blind).  Respiratory: Negative for cough, shortness of breath and wheezing.        Smokes  Cardiovascular: Positive for leg swelling. Negative for chest pain and palpitations.  Skin: Positive for color change (chronic redness to BLE; LLE has increased erythema) and wound (to left lower leg).  Psychiatric/Behavioral: Dysphoric mood:     Past Medical History:  Diagnosis Date  . Anxiety   . Arthritis   . Asthma   . CAD (coronary artery disease)   . Cardiac disease   . COPD (chronic obstructive pulmonary disease) ([place])   . Diabetes mellitus without complication ([place])   . Hyperlipidemia   . Hypertension   . Hypothyroidism    Past Surgical History:  Procedure  Laterality Date  . CARDIAC CATHETERIZATION N/A 02/05/2015   Procedure: Left Heart Cath and Coronary Angiography;  Surgeon: Burnell Blanks, MD;  Location: [facility];  Service: Cardiovascular;  Laterality: N/A;  . EYE SURGERY  2006   unsure if exact procedure  . HIP FRACTURE SURGERY    . INTRAMEDULLARY (IM) NAIL INTERTROCHANTERIC Right 02/06/2015   Procedure: INTRAMEDULLARY (IM) NAIL RIGHT HIP;  Surgeon: Leandrew Koyanagi, MD;  Location: [facility];  Service: Orthopedics;  Laterality: Right;  . JOINT REPLACEMENT     Social History:   reports that she has been smoking.  She has a 60.00 pack-year smoking history. She has never used smokeless tobacco. She reports that she drinks alcohol. She reports that she does not use drugs.  Family History  Problem Relation Age of Onset  . Alcohol abuse Father   . Cancer Mother     lung  . Heart disease Mother   . Diabetes Sister   . Heart disease Sister     Medications: Patient's Medications  New Prescriptions   No medications on file  Previous Medications   ACCU-CHEK FASTCLIX LANCETS MISC    Check blood sugar 2-3 times daily DX E11.9   ACCU-CHEK SMARTVIEW TEST STRIP    Check blood sugar 2-3 times daily DX E11.9   ALBUTEROL (PROVENTIL HFA;VENTOLIN HFA) 108 (90 BASE) MCG/ACT INHALER    Inhale 1 puff into the lungs every  6 (six) hours as needed for wheezing or shortness of breath. Reported on 09/06/2015   ALBUTEROL (PROVENTIL) (2.5 MG/3ML) 0.083% NEBULIZER SOLUTION    2.60m per 371mevery 6 hours as needed for shortness of breath/wheezing   ASPIRIN 81 MG TABLET    Take 81 mg by mouth daily.   BLOOD GLUCOSE MONITORING SUPPL (ACCU-CHEK NANO SMARTVIEW) W/DEVICE KIT    Check blood sugar 2-3 times daily DX E11.9   CARVEDILOL (COREG) 6.25 MG TABLET    Take 1 tablet (6.25 mg total) by mouth 2 (two) times daily with a meal.   INSULIN NPH HUMAN (HUMULIN N,NOVOLIN N) 100 UNIT/ML INJECTION    INJECT 25 UNITS INTO THE SKIN EACH MORNING AND 12 UNITS INTO THE  SKIN EACH NIGHT   LEVOTHYROXINE (SYNTHROID, LEVOTHROID) 137 MCG TABLET    Take 1 tablet (137 mcg total) by mouth daily.   LISINOPRIL (PRINIVIL,ZESTRIL) 10 MG TABLET    Take 1 tablet (10 mg total) by mouth daily.   MULTIPLE VITAMIN (MULTI VITAMIN PO)    Take 1 tablet by mouth daily. Reported on 09/06/2015   NITROGLYCERIN (NITROSTAT) 0.4 MG SL TABLET    Place 1 tablet (0.4 mg total) under the tongue every 5 (five) minutes as needed for chest pain.   OMEGA-3 FATTY ACIDS (OMEGA 3 PO)    Take 1 capsule by mouth daily. Reported on 09/06/2015  Modified Medications   No medications on file  Discontinued Medications   No medications on file     Physical Exam:  Vitals:   07/02/16 1448  BP: 132/80  Pulse: 63  Resp: 18  Temp: 97.5 F (36.4 C)  TempSrc: Oral  SpO2: 96%  Weight: 142 lb 3.2 oz (64.5 kg)  Height: _0  (1.6 m)   Body mass index is 25.19 kg/m.  Physical Exam  Constitutional: She is oriented to person, place, and time. She appears well-developed. No distress.  HENT:  Head: Normocephalic and atraumatic.  Cardiovascular: Normal rate, regular rhythm and normal heart sounds.  Exam reveals decreased pulses.   Pulmonary/Chest: Effort normal. She has wheezes (lower lobes).  Abdominal: Soft. Bowel sounds are normal. She exhibits no distension. There is no tenderness.  Musculoskeletal: Normal range of motion. She exhibits edema (2+ LLE edema) and tenderness (LLE).  Uses walker for support/visual aide  Neurological: She is alert and oriented to person, place, and time.  Skin: Skin is warm and dry. There is erythema.  Left leg erythema up to the knee with warmth and tenderness.  Small open areas varying in size with irregular borders limited to breakdown of epidermis to lateral and medial aspects of left leg. Small amount of weeping to left leg.   Slight erythema halfway up the right leg.  Psychiatric: She has a normal mood and affect. Her behavior is normal. Judgment and thought  content normal.    Labs reviewed: Basic Metabolic Panel:  Recent Labs  10/17/15 1550 12/24/15 2041 06/08/16 1533  NA 139 134* 135  K 4.6 3.5 3.7  CL 97 100* 101  CO2 _1 GLUCOSE 103* 89 116*  BUN _2 CREATININE 0.56* 0.54 0.86  CALCIUM 9.3 9.2 8.1*  TSH  --   --  18.04*   Liver Function Tests:  Recent Labs  12/24/15 2041 06/08/16 1533  AST 16 24  ALT 12* 16  ALKPHOS 81 100  BILITOT 0.8 0.3  PROT 7.1 6.1  ALBUMIN 3.6 3.1*    Recent Labs  12/24/15  2117  LIPASE 20   No results for input(s): AMMONIA in the last 8760 hours. CBC:  Recent Labs  09/06/15 1307 12/24/15 2041 06/08/16 1533  WBC 6.5 7.1 7.1  NEUTROABS  --   --  4,047  HGB 12.8 13.7 11.7  HCT 35.7* 40.0 35.0  MCV 92.0 95.7 95.4  PLT  --  251 219   Lipid Panel: No results for input(s): CHOL, HDL, LDLCALC, TRIG, CHOLHDL, LDLDIRECT in the last 8760 hours. TSH:  Recent Labs  06/08/16 1533  TSH 18.04*   A1C: Lab Results  Component Value Date   HGBA1C 8.9 (H) 10/17/2015     Assessment/Plan 1. Venous stasis ulcer of other part of left lower leg limited to breakdown of skin with varicose veins (HCC) - Wounds are improving, drainage is decreased. Continue dressing changes as ordered with home health and as needed in-between to prevent keeping a wet dressing on the leg - Elevate legs as much as possible - VAS [place] LOWER EXTREMITY VENOUS (DVT); Future due to increase tenderness to calf on left lower leg with swelling and redness. - Xeroform, gauze, kerlix applied in office.  2. Cellulitis of left lower extremity - doxycycline (VIBRA-TABS) 100 MG tablet; Take 1 tablet (100 mg total) by mouth 2 (two) times daily.  Dispense: 14 tablet; Refill: 0 - Elevate legs as much as possible -home health to monitor.   - Return next week for a follow-up of LLE wounds and cellulitis. If erythema or swelling worsens or if pain develops seek medical attention.   Carlos American. Harle Battiest  [facility] [phone] 8 am - 5 pm) [phone] (after hours)

## 2016-07-03 ENCOUNTER — Telehealth: Payer: Self-pay | Admitting: Nurse Practitioner

## 2016-07-03 NOTE — Telephone Encounter (Signed)
Noted, thank you

## 2016-07-03 NOTE — Telephone Encounter (Signed)
Called [facility] and spoke with Lupita LeashDonna Patient is scheduled for Monday 07/06/16 @ 3:00 (dopplers are not done on the weekends) gave Lupita LeashDonna patients daughters contact information just in case. [phone] Called and spoke with daughter and gave her the appointment information.

## 2016-07-03 NOTE — Telephone Encounter (Signed)
Called patient this am to discuss the order for the Vascular US, patient stated " I am NOT going to for that, you cancel it or don't schedule it, I'm not going!" I informed her it was important and she stated I'm not going". I informed her that I would notify the provider and she said "you do that" and ended the call.

## 2016-07-03 NOTE — Telephone Encounter (Signed)
Message left on clinical intake voicemail:    Patient's daughter called and stated that she spoke with her mother and would like to reconsider appointment for doppler.   Dorothy Nurse, adult(referral coordinator) informed. The earliest patient can go is tomorrow or Monday, patient's daughter did not have transportation for today.

## 2016-07-06 ENCOUNTER — Telehealth: Payer: Self-pay | Admitting: Nurse Practitioner

## 2016-07-06 ENCOUNTER — Ambulatory Visit (HOSPITAL_COMMUNITY)
Admission: RE | Admit: 2016-07-06 | Discharge: 2016-07-06 | Disposition: A | Discharge status: Home / Self Care | Payer: Medicare Other | Source: Ambulatory Visit | Attending: Nurse Practitioner | Admitting: Nurse Practitioner

## 2016-07-06 DIAGNOSIS — L97821 Non-pressure chronic ulcer of other part of left lower leg limited to breakdown of skin: Secondary | ICD-10-CM | POA: Diagnosis not present

## 2016-07-06 DIAGNOSIS — I83028 Varicose veins of left lower extremity with ulcer other part of lower leg: Secondary | ICD-10-CM

## 2016-07-06 NOTE — Telephone Encounter (Signed)
I spoke with the patient to follow up on SCAT application and schedule AWV. She is going to ask her granddaughter if she sent the SCAT application. Also she wants to schedule AWV at 07/09/16 appt since her granddaughter brings her for now and will be with her at that appt.

## 2016-07-06 NOTE — Progress Notes (Signed)
Preliminary results by tech - Venous Duplex Lower Ext. Left Completed. Negative for acute deep vein or superficial vein thrombosis. Incidental findings - enlarged lymph nodes noted in the left groin and proximal thigh area. [facility]. Marilynne Halstedita Jessalynn Mccowan, BS, RDMS, RVT

## 2016-07-07 ENCOUNTER — Ambulatory Visit: Payer: Medicare Other | Admitting: Nurse Practitioner

## 2016-07-09 ENCOUNTER — Ambulatory Visit: Payer: Medicare Other | Admitting: Nurse Practitioner

## 2016-07-13 ENCOUNTER — Ambulatory Visit: Payer: Medicare Other | Admitting: Nurse Practitioner

## 2016-07-13 ENCOUNTER — Telehealth: Payer: Self-pay | Admitting: *Deleted

## 2016-07-13 DIAGNOSIS — L03116 Cellulitis of left lower limb: Secondary | ICD-10-CM

## 2016-07-13 MED ORDER — DOXYCYCLINE HYCLATE 100 MG PO TABS: 100.0000 mg | ORAL_TABLET | Freq: Two times a day (BID) | ORAL | 0 refills | Status: DC

## 2016-07-13 NOTE — Telephone Encounter (Signed)
Let's send in another course of the doxycycline 100mg  po bid for 10 days.

## 2016-07-13 NOTE — Telephone Encounter (Signed)
Patient notified and agreed. Faxed Rx to pharmacy.  

## 2016-07-13 NOTE — Telephone Encounter (Signed)
Patient had an appointment today with Shanda BumpsJessica that had to be canceled due to [place]. She also had an appointment last Thursday that had to be canceled due to snow.  Patient stated that she completed her antibiotics (Doxycycline) for her leg. Stated that her leg is still warm to touch. Had a doppler done that showed Fluid but NO DVT.  Has a Home Health nurse coming out tomorrow to change the dressing. Patient think she still needs antibiotic. Would like to continue the Doxycycline because it was easy on her stomach due to the warm to touch. Patient is legally blind.  Please Advise.

## 2016-07-24 LAB — TSH: TSH: 8.57 u[IU]/mL — AB (ref 0.41–5.90)

## 2016-07-28 ENCOUNTER — Encounter: Payer: Self-pay | Admitting: Nurse Practitioner

## 2016-07-28 ENCOUNTER — Ambulatory Visit (INDEPENDENT_AMBULATORY_CARE_PROVIDER_SITE_OTHER): Payer: Medicare Other | Admitting: Nurse Practitioner

## 2016-07-28 DIAGNOSIS — L853 Xerosis cutis: Secondary | ICD-10-CM | POA: Diagnosis not present

## 2016-07-28 DIAGNOSIS — I83029 Varicose veins of left lower extremity with ulcer of unspecified site: Secondary | ICD-10-CM | POA: Diagnosis not present

## 2016-07-28 DIAGNOSIS — L97929 Non-pressure chronic ulcer of unspecified part of left lower leg with unspecified severity: Principal | ICD-10-CM

## 2016-07-28 MED ORDER — TRIAMCINOLONE ACETONIDE 0.1 % EX CREA: 1.0000 "application " | TOPICAL_CREAM | Freq: Two times a day (BID) | CUTANEOUS | 1 refills | Status: DC

## 2016-07-28 NOTE — Progress Notes (Signed)
Careteam: Patient Care Team: Lauree Chandler, NP as PCP - General (Nurse Practitioner) Hayden Pedro, MD as Consulting Physician (Ophthalmology) Jacelyn Pi, MD as Consulting Physician (Endocrinology)  Advanced Directive information    Allergies  Allergen Reactions  . Keflex [Cephalexin] Nausea Only    Loss of appetite    Chief Complaint  Patient presents with  . Medical Management of Chronic Issues    two week follow-up on leg      HPI: Patient is a 81 y.o. female seen in the office today for 2 week follow up for LE venous stasis wound check, left lower extremity greater than right. Last seen 07/02/2016. Pt has no new complaints today. Venous doppler neg for DVT. Feels as though the swelling and drainage has lessened. Has completed her doxycycline regimen as of Thursday of last week. Denies pain, increases in neuralgias, temperatures, or decreases in sensation. Home health has been completing dressing changes three times per week. Since last visit, pt has not had to complete her own intermittent dressing changes due to increased  fluid drainage by herself.   Upon interviewing, pt has c/o of back itching and upper leg itching related to dry skin.    Review of Systems:  Review of Systems  Constitutional: Negative for chills, fatigue, fever and unexpected weight change.  Respiratory: Negative for cough, chest tightness, shortness of breath and wheezing.   Cardiovascular: Negative for chest pain, palpitations and leg swelling.  Skin: Positive for color change and wound.       Left lower leg venous ulcer with irregular borders, mild breakdown. Redness and open epidermis, anterior and posterior aspect of lower leg. Bandage changes include xeroform and kerlex wraps to left leg.   Psychiatric/Behavioral: Negative for confusion.    Past Medical History:  Diagnosis Date  . Anxiety   . Arthritis   . Asthma   . CAD (coronary artery disease)   . Cardiac disease   . COPD  (chronic obstructive pulmonary disease) ([place])   . Diabetes mellitus without complication ([place])   . Hyperlipidemia   . Hypertension   . Hypothyroidism    Past Surgical History:  Procedure Laterality Date  . CARDIAC CATHETERIZATION N/A 02/05/2015   Procedure: Left Heart Cath and Coronary Angiography;  Surgeon: Burnell Blanks, MD;  Location: [place] [facility];  Service: Cardiovascular;  Laterality: N/A;  . EYE SURGERY  2006   unsure if exact procedure  . HIP FRACTURE SURGERY    . INTRAMEDULLARY (IM) NAIL INTERTROCHANTERIC Right 02/06/2015   Procedure: INTRAMEDULLARY (IM) NAIL RIGHT HIP;  Surgeon: Leandrew Koyanagi, MD;  Location: [place];  Service: Orthopedics;  Laterality: Right;  . JOINT REPLACEMENT     Social History:   reports that she has been smoking.  She has a 60.00 pack-year smoking history. She has never used smokeless tobacco. She reports that she drinks alcohol. She reports that she does not use drugs.  Family History  Problem Relation Age of Onset  . Alcohol abuse Father   . Cancer Mother     lung  . Heart disease Mother   . Diabetes Sister   . Heart disease Sister     Medications: Patient's Medications  New Prescriptions   No medications on file  Previous Medications   ACCU-CHEK FASTCLIX LANCETS MISC    Check blood sugar 2-3 times daily DX E11.9   ACCU-CHEK SMARTVIEW TEST STRIP    Check blood sugar 2-3 times daily DX E11.9   ALBUTEROL (  PROVENTIL HFA;VENTOLIN HFA) 108 (90 BASE) MCG/ACT INHALER    Inhale 1 puff into the lungs every 6 (six) hours as needed for wheezing or shortness of breath. Reported on 09/06/2015   ALBUTEROL (PROVENTIL) (2.5 MG/3ML) 0.083% NEBULIZER SOLUTION    2.59m per 359mevery 6 hours as needed for shortness of breath/wheezing   ASPIRIN 81 MG TABLET    Take 81 mg by mouth daily.   BLOOD GLUCOSE MONITORING SUPPL (ACCU-CHEK NANO SMARTVIEW) W/DEVICE KIT    Check blood sugar 2-3 times daily DX E11.9   CARVEDILOL (COREG) 6.25 MG TABLET    Take 1  tablet (6.25 mg total) by mouth 2 (two) times daily with a meal.   INSULIN NPH HUMAN (HUMULIN N,NOVOLIN N) 100 UNIT/ML INJECTION    INJECT 25 UNITS INTO THE SKIN EACH MORNING AND 12 UNITS INTO THE SKIN EACH NIGHT   LEVOTHYROXINE (SYNTHROID, LEVOTHROID) 137 MCG TABLET    Take 1 tablet (137 mcg total) by mouth daily.   LISINOPRIL (PRINIVIL,ZESTRIL) 10 MG TABLET    Take 1 tablet (10 mg total) by mouth daily.   MULTIPLE VITAMIN (MULTI VITAMIN PO)    Take 1 tablet by mouth daily. Reported on 09/06/2015   NITROGLYCERIN (NITROSTAT) 0.4 MG SL TABLET    Place 1 tablet (0.4 mg total) under the tongue every 5 (five) minutes as needed for chest pain.   OMEGA-3 FATTY ACIDS (OMEGA 3 PO)    Take 1 capsule by mouth daily. Reported on 09/06/2015  Modified Medications   No medications on file  Discontinued Medications   DOXYCYCLINE (VIBRA-TABS) 100 MG TABLET    Take 1 tablet (100 mg total) by mouth 2 (two) times daily.     Physical Exam:  Vitals:   07/28/16 1311  BP: 132/68  Pulse: 65  Resp: 18  Temp: 97.8 F (36.6 C)  TempSrc: Oral  SpO2: 98%  Weight: 145 lb (65.8 kg)   Body mass index is 25.69 kg/m.  Physical Exam  Constitutional: She appears well-developed and well-nourished. No distress.  HENT:  Head: Normocephalic and atraumatic.  Right Ear: External ear normal.  Left Ear: External ear normal.  Eyes: Conjunctivae and EOM are normal. Pupils are equal, round, and reactive to light.  Cardiovascular: Normal rate, regular rhythm and normal heart sounds.   No murmur heard. Pulmonary/Chest: Effort normal and breath sounds normal. No respiratory distress. She has no wheezes. She exhibits no tenderness.  Skin: There is erythema.  Left lower extremity venous stasis ulcerations. Present anterior and posterior lower leg. Mild epidermis breakdown with minimal serous drainage at this time. HH to redress wound three time per week.   Psychiatric: She has a normal mood and affect.    Labs  reviewed: Basic Metabolic Panel:  Recent Labs  10/17/15 1550 12/24/15 2041 06/08/16 1533  NA 139 134* 135  K 4.6 3.5 3.7  CL 97 100* 101  CO2 25 26 28   GLUCOSE 103* 89 116*  BUN 16 10 20   CREATININE 0.56* 0.54 0.86  CALCIUM 9.3 9.2 8.1*  TSH  --   --  18.04*   Liver Function Tests:  Recent Labs  12/24/15 2041 06/08/16 1533  AST 16 24  ALT 12* 16  ALKPHOS 81 100  BILITOT 0.8 0.3  PROT 7.1 6.1  ALBUMIN 3.6 3.1*    Recent Labs  12/24/15 2117  LIPASE 20   No results for input(s): AMMONIA in the last 8760 hours. CBC:  Recent Labs  09/06/15 1307 12/24/15 2041 06/08/16  1533  WBC 6.5 7.1 7.1  NEUTROABS  --   --  4,047  HGB 12.8 13.7 11.7  HCT 35.7* 40.0 35.0  MCV 92.0 95.7 95.4  PLT  --  251 219   Lipid Panel: No results for input(s): CHOL, HDL, LDLCALC, TRIG, CHOLHDL, LDLDIRECT in the last 8760 hours. TSH:  Recent Labs  06/08/16 1533  TSH 18.04*   A1C: Lab Results  Component Value Date   HGBA1C 8.9 (H) 10/17/2015     Assessment/Plan 1. Venous ulcer of left leg (HCC) -stable- cellulitis resolved, Dr Nyoka Cowden evaluated wound as well at this time will cont current regimen  -Continue home health dressing changes three times per week.  -Pt to change PRN if needed if increased drainage. -Will culture wound today due to stagnant progress despite dressing changes, xeroform, and antibiotic therapy. -Continue to elevate legs will sitting to decrease drainage and swelling.  -Maintain adequate nutritional intake, especially proteins. - WOUND CULTURE  2. Dry skin dermatitis -to use gold bond anti itch lotion twice daily and as needed also can use triamcinolone as needed  - triamcinolone cream (KENALOG) 0.1 %; Apply 1 application topically 2 (two) times daily.  Dispense: 30 g; Refill: 1  Form completed for SCAT for transportation  Will see pt back in two weeks to follow progress, sooner if needed  Sherrie Mustache, NP [place] [phone] 8 am - 5 pm) [phone] (after hours)

## 2016-07-28 NOTE — Patient Instructions (Signed)
To use triamcinolone twice daily as needed for itching Also use GOLD BOND anti-itch lotion   Cont current wound care per home health nursing

## 2016-07-29 ENCOUNTER — Telehealth: Payer: Self-pay

## 2016-07-29 NOTE — Telephone Encounter (Signed)
During OV on 07/28/16, patient brought in paperwork for SCAT eligibility to be completed by Shanda BumpsJessica. The forms were completed and faxed to SCAT eligibility staff at [phone]. Patient's granddaughter was given information sheets that explained that patient must call [phone] to set up an in-person interview with the SCAT eligibility team. Both patient and granddaughter expressed understanding that they are responsible for setting up in-person interview.    Patient's granddaughter was given the original eligibility application after a copy was made for scanning. The office copy is located in my office mailbox. It will be sent for scanning after verification that fax was received by SCAT eligibility staff.

## 2016-08-01 LAB — WOUND CULTURE
GRAM STAIN: NONE SEEN
Gram Stain: NONE SEEN

## 2016-08-03 ENCOUNTER — Other Ambulatory Visit: Payer: Self-pay | Admitting: Nurse Practitioner

## 2016-08-03 MED ORDER — SULFAMETHOXAZOLE-TRIMETHOPRIM 800-160 MG PO TABS: 1.0000 | ORAL_TABLET | Freq: Two times a day (BID) | ORAL | 0 refills | Status: DC

## 2016-08-07 ENCOUNTER — Telehealth: Payer: Self-pay

## 2016-08-07 NOTE — Telephone Encounter (Signed)
Dixon BoosKelly Obrien with [facility] called to ask for an order for foam padded bandage. Patient has a new wound on the ball of left foot. Tresa EndoKelly states that it looks as fit patient's slipper has rubbed a raw spot. Tresa EndoKelly stated she would recheck foot wound on Monday 08/10/16 and she will call the office with an update on wound condition.   Verbal order was given.

## 2016-08-07 NOTE — Telephone Encounter (Signed)
noted 

## 2016-08-20 ENCOUNTER — Telehealth: Payer: Self-pay

## 2016-08-20 ENCOUNTER — Encounter: Payer: Self-pay | Admitting: Nurse Practitioner

## 2016-08-20 LAB — COLOGUARD

## 2016-08-20 NOTE — Telephone Encounter (Signed)
The order has been cancelled because it has exceeded 365 days from the initial order and has expired.  Does the cologuard need to be reordered? Please advise.

## 2016-08-20 NOTE — Telephone Encounter (Signed)
I dont know will she do it?

## 2016-08-20 NOTE — Telephone Encounter (Signed)
I called patient and she stated that she does not want to do any more tests right now. She stated that she does not remember completing forms to have it ordered.

## 2016-08-27 ENCOUNTER — Encounter: Payer: Self-pay | Admitting: Nurse Practitioner

## 2016-08-27 ENCOUNTER — Ambulatory Visit (INDEPENDENT_AMBULATORY_CARE_PROVIDER_SITE_OTHER): Payer: Medicare Other | Admitting: Nurse Practitioner

## 2016-08-27 VITALS — BP 122/72 | HR 81 | Temp 97.6°F | Resp 18 | Ht 61.0 in | Wt 148.6 lb

## 2016-08-27 DIAGNOSIS — Z794 Long term (current) use of insulin: Secondary | ICD-10-CM | POA: Diagnosis not present

## 2016-08-27 DIAGNOSIS — L97929 Non-pressure chronic ulcer of unspecified part of left lower leg with unspecified severity: Principal | ICD-10-CM

## 2016-08-27 DIAGNOSIS — E118 Type 2 diabetes mellitus with unspecified complications: Secondary | ICD-10-CM | POA: Diagnosis not present

## 2016-08-27 DIAGNOSIS — I83029 Varicose veins of left lower extremity with ulcer of unspecified site: Secondary | ICD-10-CM | POA: Diagnosis not present

## 2016-08-27 MED ORDER — LEVOFLOXACIN 750 MG PO TABS: 750.0000 mg | ORAL_TABLET | Freq: Every day | ORAL | 0 refills | Status: AC

## 2016-08-27 NOTE — Progress Notes (Signed)
Careteam: Patient Care Team: Lauree Chandler, NP as PCP - General (Nurse Practitioner) Hayden Pedro, MD as Consulting Physician (Ophthalmology) Jacelyn Pi, MD as Consulting Physician (Endocrinology)  Advanced Directive information Does Patient Have a Medical Advance Directive?: No  Allergies  Allergen Reactions  . Keflex [Cephalexin] Nausea Only    Loss of appetite    Chief Complaint  Patient presents with  . Follow-up    Pt is being seen for follow up on leg wound.      HPI: Patient is a 81 y.o. female seen in the office today for follow up for LLE wound infection/venous stasis wound check. One course of Bactrim given after wound culture completed. Last dose last Thursday. Pt reports that the wound had been improving while on the Bactrim, however has noticed that the wound has been weeping and swelling more over the last few days. ABD pads soaked during assessment. Home health RN continues to come for dressing changes M,W,F every week. Pt denies pain, increases, neuralgias, temperature changes, or decreases in sensation. Pt does reports a low grade fever eallier this week with SOB, however this has resolved with ASA and her home albuterol neb.    She also reports that generalized itching was getting better while on the antibiotic, but is increasing again. She was prescribed triamcinolone at last visit, but denies that this is working for her.   Daughter is with the pt today and voices concerns for when home health is no longer available to help the patient draw up her insulin at home. She is inquiring about the use of [facility] for assistance with medication adminstration and home health services.     Review of Systems:  Review of Systems  Constitutional: Negative for activity change, appetite change, chills, diaphoresis, fatigue and fever.  HENT: Negative for congestion, postnasal drip, sinus pressure, sneezing and sore throat.   Eyes: Negative for discharge, redness and  itching.  Respiratory: Negative for cough, chest tightness, shortness of breath and wheezing.   Cardiovascular: Positive for leg swelling. Negative for chest pain and palpitations.       Left lower extremity swelling due to venous stasis ulcer and infection  Gastrointestinal: Negative for abdominal distention, abdominal pain, constipation, diarrhea and nausea.  Skin: Positive for color change and wound.  Psychiatric/Behavioral: Negative for behavioral problems and sleep disturbance. The patient is not nervous/anxious.     Past Medical History:  Diagnosis Date  . Anxiety   . Arthritis   . Asthma   . CAD (coronary artery disease)   . Cardiac disease   . COPD (chronic obstructive pulmonary disease) ([place])   . Diabetes mellitus without complication ([place])   . Hyperlipidemia   . Hypertension   . Hypothyroidism    Past Surgical History:  Procedure Laterality Date  . CARDIAC CATHETERIZATION N/A 02/05/2015   Procedure: Left Heart Cath and Coronary Angiography;  Surgeon: Burnell Blanks, MD;  Location: [place] [facility];  Service: Cardiovascular;  Laterality: N/A;  . EYE SURGERY  2006   unsure if exact procedure  . HIP FRACTURE SURGERY    . INTRAMEDULLARY (IM) NAIL INTERTROCHANTERIC Right 02/06/2015   Procedure: INTRAMEDULLARY (IM) NAIL RIGHT HIP;  Surgeon: Leandrew Koyanagi, MD;  Location: [place];  Service: Orthopedics;  Laterality: Right;  . JOINT REPLACEMENT     Social History:   reports that she has been smoking.  She has a 60.00 pack-year smoking history. She has never used smokeless tobacco. She reports that  she drinks alcohol. She reports that she does not use drugs.  Family History  Problem Relation Age of Onset  . Alcohol abuse Father   . Cancer Mother     lung  . Heart disease Mother   . Diabetes Sister   . Heart disease Sister     Medications: Patient's Medications  New Prescriptions   LEVOFLOXACIN (LEVAQUIN) 750 MG TABLET    Take 1 tablet (750 mg total) by mouth  daily.  Previous Medications   ACCU-CHEK FASTCLIX LANCETS MISC    Check blood sugar 2-3 times daily DX E11.9   ACCU-CHEK SMARTVIEW TEST STRIP    Check blood sugar 2-3 times daily DX E11.9   ALBUTEROL (PROVENTIL HFA;VENTOLIN HFA) 108 (90 BASE) MCG/ACT INHALER    Inhale 1 puff into the lungs every 6 (six) hours as needed for wheezing or shortness of breath. Reported on 09/06/2015   ALBUTEROL (PROVENTIL) (2.5 MG/3ML) 0.083% NEBULIZER SOLUTION    2.40m per 379mevery 6 hours as needed for shortness of breath/wheezing   ASPIRIN 81 MG TABLET    Take 81 mg by mouth daily.   BLOOD GLUCOSE MONITORING SUPPL (ACCU-CHEK NANO SMARTVIEW) W/DEVICE KIT    Check blood sugar 2-3 times daily DX E11.9   CARVEDILOL (COREG) 6.25 MG TABLET    Take 1 tablet (6.25 mg total) by mouth 2 (two) times daily with a meal.   DOXEPIN HCL (ZONALON) 5 % CREA    Apply topically. Apply to skin every 6 hours as needed for itch, max of 8 days   INSULIN NPH HUMAN (HUMULIN N,NOVOLIN N) 100 UNIT/ML INJECTION    INJECT 25 UNITS INTO THE SKIN EACH MORNING AND 12 UNITS INTO THE SKIN EACH NIGHT   LEVOTHYROXINE (SYNTHROID, LEVOTHROID) 137 MCG TABLET    Take 1 tablet (137 mcg total) by mouth daily.   LISINOPRIL (PRINIVIL,ZESTRIL) 10 MG TABLET    Take 1 tablet (10 mg total) by mouth daily.   MULTIPLE VITAMIN (MULTI VITAMIN PO)    Take 1 tablet by mouth daily. Reported on 09/06/2015   NITROGLYCERIN (NITROSTAT) 0.4 MG SL TABLET    Place 1 tablet (0.4 mg total) under the tongue every 5 (five) minutes as needed for chest pain.   OMEGA-3 FATTY ACIDS (OMEGA 3 PO)    Take 1 capsule by mouth daily. Reported on 09/06/2015   SULFAMETHOXAZOLE-TRIMETHOPRIM (BACTRIM DS,SEPTRA DS) 800-160 MG TABLET    Take 1 tablet by mouth 2 (two) times daily.   TRIAMCINOLONE CREAM (KENALOG) 0.1 %    Apply 1 application topically 2 (two) times daily.  Modified Medications   No medications on file  Discontinued Medications   No medications on file     Physical  Exam:  Vitals:   08/27/16 1038  BP: 122/72  Pulse: 81  Resp: 18  Temp: 97.6 F (36.4 C)  TempSrc: Oral  SpO2: 95%  Weight: 148 lb 9.6 oz (67.4 kg)  Height: 5' 1"  (1.549 m)   Body mass index is 28.08 kg/m.  Physical Exam  Constitutional: She is oriented to person, place, and time. She appears well-developed and well-nourished. No distress.  HENT:  Head: Normocephalic and atraumatic.  Mouth/Throat: Oropharynx is clear and moist.  Eyes: Pupils are equal, round, and reactive to light. Right eye exhibits no discharge. Left eye exhibits no discharge.  Neck: Normal range of motion.  Cardiovascular: Normal rate and normal heart sounds.   No murmur heard. Pulses:      Radial pulses are 2+ on  the right side, and 2+ on the left side.       Dorsalis pedis pulses are 1+ on the right side, and 1+ on the left side.  Pulmonary/Chest: Effort normal. No tachypnea. No respiratory distress. She has no wheezes. She has no rales. She exhibits no tenderness.  Abdominal: Soft. Bowel sounds are normal. She exhibits no distension. There is no tenderness. There is no guarding.  Musculoskeletal: She exhibits edema.  Lymphadenopathy:    She has no cervical adenopathy.  Neurological: She is alert and oriented to person, place, and time.  Skin: Skin is warm. She is not diaphoretic.  Pt has left greater than right reddened venous skin ulcerations, mild skin sloughing, with serousanguinous fluid drainage. There are areas of healing and scabbing bilaterally.        Labs reviewed: Basic Metabolic Panel:  Recent Labs  10/17/15 1550 12/24/15 2041 06/08/16 1533 07/24/16  NA 139 134* 135  --   K 4.6 3.5 3.7  --   CL 97 100* 101  --   CO2 25 26 28   --   GLUCOSE 103* 89 116*  --   BUN 16 10 20   --   CREATININE 0.56* 0.54 0.86  --   CALCIUM 9.3 9.2 8.1*  --   TSH  --   --  18.04* 8.57*   Liver Function Tests:  Recent Labs  12/24/15 2041 06/08/16 1533  AST 16 24  ALT 12* 16  ALKPHOS 81 100   BILITOT 0.8 0.3  PROT 7.1 6.1  ALBUMIN 3.6 3.1*    Recent Labs  12/24/15 2117  LIPASE 20   No results for input(s): AMMONIA in the last 8760 hours. CBC:  Recent Labs  09/06/15 1307 12/24/15 2041 06/08/16 1533  WBC 6.5 7.1 7.1  NEUTROABS  --   --  4,047  HGB 12.8 13.7 11.7  HCT 35.7* 40.0 35.0  MCV 92.0 95.7 95.4  PLT  --  251 219   Lipid Panel: No results for input(s): CHOL, HDL, LDLCALC, TRIG, CHOLHDL, LDLDIRECT in the last 8760 hours. TSH:  Recent Labs  06/08/16 1533 07/24/16  TSH 18.04* 8.57*   A1C: Lab Results  Component Value Date   HGBA1C 8.9 (H) 10/17/2015     Assessment/Plan 1. Venous ulcer of left leg (HCC) -Previous wound culture revealed staph aureus infection to the left lower extremity. -Pt hac completed Bactrim course without resolution -Will start Levofloxacin 738m PO QD x 10 days -Continue home health dressing changes M, W, F and as needed per drainage. -Educated pt to avoid picking and itching lower legs -Encouraged to start Florastor twice daily due to ongoing antibiotic therapy -Pt will need to purchase more ABD pads and wound care dressing supplies  -Will follow up with pt in 2 weeks, sooner if needed  2. DM -Pt daughter has safety concerns about pt giving herself insulin -Pt is legally blind in both eyes but states she has been fine doing this.  -Currently, she is having home health nurses draw up her insulin when they are there and she does It on the other days -[facility].    -Will follow up at next encounter for wound care to see progress  Kazimierz Springborn K. EHarle Battiest [facility] [phone] am - 5 pm) [phone](after hours)

## 2016-08-27 NOTE — Patient Instructions (Signed)
To start Levaquin 750 mg daily for 10 days To take probiotic twice daily for 1 month  Cont dressing to legs

## 2016-08-28 ENCOUNTER — Telehealth: Payer: Self-pay

## 2016-08-28 NOTE — Telephone Encounter (Signed)
[name] with [facility] called to request verbal orders for 2 x weekly and 2 as needed visits, orders will be faxed as well.  [facility], verbal order given. Message will be sent to patient's provider as a FYI.

## 2016-08-30 DIAGNOSIS — I2510 Atherosclerotic heart disease of native coronary artery without angina pectoris: Secondary | ICD-10-CM | POA: Diagnosis not present

## 2016-08-30 DIAGNOSIS — J449 Chronic obstructive pulmonary disease, unspecified: Secondary | ICD-10-CM | POA: Diagnosis not present

## 2016-08-30 DIAGNOSIS — I10 Essential (primary) hypertension: Secondary | ICD-10-CM | POA: Diagnosis not present

## 2016-08-30 DIAGNOSIS — H3530 Unspecified macular degeneration: Secondary | ICD-10-CM | POA: Diagnosis not present

## 2016-08-30 DIAGNOSIS — Z4800 Encounter for change or removal of nonsurgical wound dressing: Secondary | ICD-10-CM | POA: Diagnosis not present

## 2016-08-30 DIAGNOSIS — I872 Venous insufficiency (chronic) (peripheral): Secondary | ICD-10-CM | POA: Diagnosis not present

## 2016-08-30 DIAGNOSIS — H548 Legal blindness, as defined in USA: Secondary | ICD-10-CM | POA: Diagnosis not present

## 2016-08-30 DIAGNOSIS — E11311 Type 2 diabetes mellitus with unspecified diabetic retinopathy with macular edema: Secondary | ICD-10-CM | POA: Diagnosis not present

## 2016-09-08 ENCOUNTER — Other Ambulatory Visit: Payer: Self-pay

## 2016-09-08 NOTE — Patient Outreach (Signed)
[facility]) Care Management  09/08/2016  Tara PoeBetty L Flores 08/19/1935 [id number]   REFERRAL RECEIVED 08/27/16  REFERRAL SOURCE: [facility] REFERRAL REASON: Chronic health conditions, limited support system, difficulties with transportation  Telephone call to patient regarding home health referral. Unable to reach patient. HIPAA compliant voice message left with call back phone number.   PLAN; RNCM will attempt 2nd telephone within 1 week.   George [name] RN,BSN,CCM [facility] Telephonic  [phone]

## 2016-09-09 ENCOUNTER — Other Ambulatory Visit: Payer: Self-pay

## 2016-09-09 NOTE — Patient Outreach (Signed)
[facility]) Care Management  09/09/2016  Tara PoeBetty L Flores 03/18/1936 [id number]  Second telephone outreach to patient. Unable to reach. HIPAA compliant voice message left with call back phone number.   PLAN; RNCM will attempt 3rd telephone outreach with in 2 weeks.   George InaDavina Loryn Haacke RN,BSN,CCM [facility] Telephonic  [phone]

## 2016-09-10 ENCOUNTER — Ambulatory Visit: Payer: Medicare Other | Admitting: Nurse Practitioner

## 2016-09-17 ENCOUNTER — Other Ambulatory Visit: Payer: Self-pay

## 2016-09-17 NOTE — Patient Outreach (Signed)
[facility]) Care Management  09/17/2016  Tara Flores 09/13/1935 [id number]  REFERRAL DATE: 08/27/16 REFERRAL SOURCE: [facility]  REFERRAL REASON: Chronic health conditions, limited support system, difficulties with transportation. ( home health services since July 2017, does not qualify for assisted financially)  PROVIDERS:  Abbey ChattersJessica Eubanks, NP - primary care provider Dr. Talmage NapBalan endocrinologist.  Dr. Ashley RoyaltyMatthews- opthalmologist  SOCIAL: Patient lives alone.  Has a daughter that is her support system  ADVANCE DIRECTIVE:  Patient states she has a DNR order.   Telephone call to patient regarding home health referral. HIPAA verified with patient. Discussed and offered [facility] care management services to patient. Patient verbally agreed to services.   VISION: Patient states her vision is getting worse. Patient states she saw her eye doctor approximately 1 year ago. Patient states she has retinopathy and macular degeneration. Patient reports she does not wear glasses because it does not help. Patient states her eyes are becoming more cloudy.  DIABETES: Patient states she has been diabetic since 1984. Patient reports her most recent A1c is 8.2. Patient states the nurses that provide dressing changes to her legs from [facility] have been helping her to draw up her insulin.  Patient states she needs additional help with his because the nurses are not seeing her for this issue.  FALLS: Patient states she has fallen 1 time within the past 3 months. Patient states her balance is off. Patient states she had some pain from the fall in her rib area but refused to go to the hospital. Patient reports the pain has resolved. . Patient states she had to call EMS to help her up.  Patient states she uses a walker for ambulation. SOCIAL NEEDS:  Patient reports she needs assistance with light house keeping at home. Patient states she is not able to cook as before. Patient reports she  receives meals on wheels.  Patient states she needs assistance with transportation. States she was unable to do SCAT services.  MEDICAL: Patient being seen by [facility] for venous ulcer to both legs.  Patient receiving dressing changes 2 times per week. Patient reports skin area is healing well. Patient states she was on antibiotics for staph infection to her legs. Reports she has completed antibiotics.   ASSESSMENT: Patient will benefit from community case manager referral for home safety assessment and medication management. Patient will benefit from social work referral for community resources and transportation assistance.   PLAN: RNCM will refer patient to community case Production designer, theatre/television/filmmanager and Child psychotherapistsocial worker.  Tara InaDavina Markita Stcharles RN,BSN,CCM [facility] Telephonic  [phone]

## 2016-09-21 ENCOUNTER — Other Ambulatory Visit: Payer: Self-pay

## 2016-09-21 NOTE — Patient Outreach (Signed)
Telephone assessment: New referral for DM management.  Placed call to patient. No answer. Left a HIPPA complaint message requesting a call back.  PLAN: will await a call back. If no response will attempt telephone outreach again.  [name], RN, BSN, CEN [facility] [employer] [phone]

## 2016-09-22 ENCOUNTER — Other Ambulatory Visit: Payer: Self-pay

## 2016-09-22 NOTE — Patient Outreach (Signed)
Telephone assessment: New referral.  Placed call to patient. Reviewed reason for call.  Discussed [facility] case management program.  Patient reports that she is doing well with the home health nurses who are helping her. Reviewed [facility] program is different than home health.  Patient is very hesitant about needs. Reports that she uses a number 7 magnifying glass to draw up her insulin.  Reviewed recent fall and patient states that she has had a recent fall.    Offered home visit and patient accepted for April 12th. Confirmed address.  PLAN: Home visit on 10/01/2016 at 1030 am  [name], Charity fundraiser, Scientist, research (physical sciences), [facility] [employer] [phone]

## 2016-09-23 ENCOUNTER — Other Ambulatory Visit: Payer: Self-pay | Admitting: *Deleted

## 2016-09-23 ENCOUNTER — Telehealth: Payer: Self-pay

## 2016-09-23 ENCOUNTER — Other Ambulatory Visit: Payer: Self-pay

## 2016-09-23 NOTE — Progress Notes (Signed)
This encounter was created in error - please disregard.  This encounter was created in error - please disregard.

## 2016-09-23 NOTE — Telephone Encounter (Signed)
-----   Message from Sharon Seller, NP sent at 09/23/2016 11:16 AM EDT ----- Regarding: FW: Other Please notify daughter who requested this service.  ----- Message ----- From: Rockne Menghini, RN Sent: 09/23/2016  11:03 AM To: Sharon Seller, NP Subject: Other

## 2016-09-23 NOTE — Patient Outreach (Signed)
Case closure: Incoming call from patient who states that she has changed her mind and is not interested in [facility] services.    States that she is managing well without needing any more assistance.  PLAN: Will close case per patients request. Will cancel planned home visit for 4/12.  Will notify [facility] social worker to also close case.  Will send MD letter and patient case closure letter. Will notify [facility] care management assistants of case closure.  [name], RN, BSN, CEN [facility] [employer] [phone]

## 2016-09-23 NOTE — Patient Outreach (Signed)
[facility]) Care Management  09/23/2016  JIMI GIZA 05/28/1936 [id number]   CSW contacted patient who reports she "made a mistake'. She does want to plan for [facility] visit from [facility] and CSW. "I got confused with so many people calling and I am tired of getting denied". CSW discussed the [facility] program and services with her again and she would like to continue with visit as previously planned for [name], RN, [facility] RNCM, to visit along with this CSW.  CSW will re-open case, advise [facility] RNCM and PCP and plan visit on 10/01/16.        Reece Levy, MSW, LCSW Clinical Social Worker  [facility] [employer] [phone]

## 2016-09-23 NOTE — Telephone Encounter (Signed)
I called patient's daughter, Jendaya Gossett, to let her know that Shanda Bumps received a message stating that patient has refused to continue services with [facility].   Tharon Kitch would like for the services to be restarted and she will speak with patient. Azka Steger was instructed to call [employer] or [name], RN to see about getting services restarted. If a new referral is needed then one will be provided.   Shon Mansouri verbalized understanding and stated that she would keep the office updated.

## 2016-09-28 ENCOUNTER — Other Ambulatory Visit: Payer: Self-pay | Admitting: *Deleted

## 2016-09-28 MED ORDER — ACCU-CHEK SMARTVIEW VI STRP: ORAL_STRIP | 3 refills | Status: DC

## 2016-09-28 MED ORDER — ACCU-CHEK NANO SMARTVIEW W/DEVICE KIT: PACK | 0 refills | Status: DC

## 2016-09-28 MED ORDER — ACCU-CHEK FASTCLIX LANCETS MISC: 3 refills | Status: DC

## 2016-09-28 NOTE — Telephone Encounter (Signed)
[facility] 

## 2016-10-01 ENCOUNTER — Encounter: Payer: Self-pay | Admitting: *Deleted

## 2016-10-01 ENCOUNTER — Other Ambulatory Visit: Payer: Self-pay | Admitting: *Deleted

## 2016-10-01 ENCOUNTER — Ambulatory Visit: Payer: Medicare Other

## 2016-10-01 ENCOUNTER — Other Ambulatory Visit: Payer: Self-pay

## 2016-10-01 ENCOUNTER — Telehealth: Payer: Self-pay

## 2016-10-01 NOTE — Telephone Encounter (Signed)
[name] with [facility] called to inform Tara Flores patient with a new finding on the bottom of her left foot. Area onset over the weekend, a home health nurse noticed Monday.  Area of concern is dark in the middle and white around the edges. Area is about a quarter size. Patient denies pain. Patient's legs continue to weep which makes this new area moist. Patient is diabetic and Marchelle Folks is very concerned about this new finding.   Patient is currently receiving Home Health 2 x weekly and would like this increased.  Please advise if patient needs to be seen to evaluate new finding prior to giving additional orders for Home Health.   I reviewed the schedules and no openings today or tomorrow. Message will be forwarded to both office providers to see if patient can be worked in tomorrow (if appointment required)  Side Note: Patient with transportation issues and we need to follow-up with patient and [facility] Nurse Marchelle Folks @ [phone] to arrange transportation.

## 2016-10-01 NOTE — Patient Outreach (Signed)
[facility]) Care Management  [facility] Social Work  10/01/2016  Tara Flores [birth year] [id number]  Subjective:   "My daughter is trying to find me somewhere to live closer to her".  Objective:  CSW to assist patient with commuity based resources to aide in her well-being, quality of life and overall safety/needs.    Encounter Medications:  Outpatient Encounter Prescriptions as of 10/01/2016  Medication Sig  . ACCU-CHEK FASTCLIX LANCETS MISC Check blood sugar 2-3 times daily DX E11.9 (Patient not taking: Reported on 10/01/2016)  . ACCU-CHEK SMARTVIEW test strip Check blood sugar 2-3 times daily DX E11.9 (Patient not taking: Reported on 10/01/2016)  . albuterol (PROVENTIL HFA;VENTOLIN HFA) 108 (90 Base) MCG/ACT inhaler Inhale 1 puff into the lungs every 6 (six) hours as needed for wheezing or shortness of breath. Reported on 09/06/2015  . albuterol (PROVENTIL) (2.5 MG/3ML) 0.083% nebulizer solution 2.38m per 329mevery 6 hours as needed for shortness of breath/wheezing  . aspirin 81 MG tablet Take 81 mg by mouth daily.  . Blood Glucose Monitoring Suppl (ACCU-CHEK NANO SMARTVIEW) w/Device KIT Check blood sugar 2-3 times daily DX E11.9  . carvedilol (COREG) 6.25 MG tablet Take 1 tablet (6.25 mg total) by mouth 2 (two) times daily with a meal.  . Doxepin HCl (ZONALON) 5 % CREA Apply topically. Apply to skin every 6 hours as needed for itch, max of 8 days  . insulin NPH Human (HUMULIN N,NOVOLIN N) 100 UNIT/ML injection INJECT 25 UNITS INTO THE SKIN EACH MORNING AND 12 UNITS INTO THE SKIN EACH NIGHT  . levothyroxine (SYNTHROID, LEVOTHROID) 137 MCG tablet Take 1 tablet (137 mcg total) by mouth daily. (Patient not taking: Reported on 10/01/2016)  . lisinopril (PRINIVIL,ZESTRIL) 10 MG tablet Take 1 tablet (10 mg total) by mouth daily.  . Multiple Vitamin (MULTI VITAMIN PO) Take 1 tablet by mouth daily. Reported on 09/06/2015  . nitroGLYCERIN (NITROSTAT) 0.4 MG SL tablet Place 1 tablet (0.4  mg total) under the tongue every 5 (five) minutes as needed for chest pain. (Patient not taking: Reported on 10/01/2016)  . Omega-3 Fatty Acids (OMEGA 3 PO) Take 1 capsule by mouth daily. Reported on 09/06/2015  . sulfamethoxazole-trimethoprim (BACTRIM DS,SEPTRA DS) 800-160 MG tablet Take 1 tablet by mouth 2 (two) times daily. (Patient not taking: Reported on 08/27/2016)  . triamcinolone cream (KENALOG) 0.1 % Apply 1 application topically 2 (two) times daily. (Patient not taking: Reported on 10/01/2016)   No facility-administered encounter medications on file as of 10/01/2016.     Functional Status:  No flowsheet data found.  Fall/Depression Screening:  PHQ 2/9 Scores 10/01/2016 09/17/2016 09/17/2015 09/08/2015 09/06/2015 08/20/2015  PHQ - 2 Score 0 0 0 0 0 0    Assessment:  CSW [facility] patient [facility] Patient is visually impaired but is fairly independent and able to get around her mobile home with the use of a walker and some "furniture walking".  Her home is in poor conditions; entry steps are old wooden and wobbly. Her home has lots of need for repairs ceiling with some holes, water stains,etc and an overall concerning environment for her health and well-beinnog.  CSW spoke to her daugher, Tara Flores, by phone today after the home visit and discussed our role, services, support and concerns.     Plan:  CSW will work with patient and her daughter to seek housing and other resources to aide in her needs/quality of life.  Eduard Clos, MSW, Forest Worker  [place] [phone]

## 2016-10-01 NOTE — Patient Outreach (Addendum)
[facility]) Care Management   10/01/2016  Tara Flores September 23, 1935 [id number]  Tara Flores is an 81 y.o. female 10:30 am  Joint Visit with [facility] social worker Eduard Clos.  Subjective:  Patient reports that she thinks she is doing pretty well. States that she can manage her medications.  Patient states [facility] is doing dressing changes to her lower legs twice a week. Patient reports legs are healing.  Patient reports that the home health nurses draw up her insulin right now but she feels like she can do this herself.  Patient lives alone with her dog Kugo.  Patient reports that she sleeps in her chair.  Reports that she manages pretty well.  Patient states that her skin itches a lot. States that she struggles with cooking, cleaning and transportation.  Patient reports that her daughter is trying to get her to move closer to her to assist. Patient reports that home health nurse comes twice a week for her weeping legs. Reports dressing was wet after being applied 3 days ago so she removed dressing and took a shower. Reports home health nurse is coming today at 3pm. Objective:  Awake and alert. Ambulates slowly without using walker.  Home steps are dangerous with loose boards.  Home unkept.  Home needs repairs. Legs swollen with drainage running into shoes.  Vitals:   10/01/16 1103  BP: (!) 118/52  Pulse: 63  Resp: 18  SpO2: 99%  Weight: 142 lb (64.4 kg)  Height: 1.549 m (5' 1" )   Review of Systems  Constitutional: Negative.   HENT: Negative.   Eyes:       Reports legally blind in both eyes.  Uses x7 magnifying glass to read.  Reports that she does not wear her glasses because it does not help with her vision  Respiratory: Positive for cough and shortness of breath.        Reports cough and shortness of breath due to pollen. Reports that her nebulizer helps with this  Cardiovascular: Positive for leg swelling.  Gastrointestinal: Negative.   Genitourinary:  Negative.   Musculoskeletal: Positive for falls.  Skin:       Reports weeping legs and itchy skin for " a long time"  Reports she gets something new every 3 months.   Neurological: Positive for seizures.  Endo/Heme/Allergies: Negative.   Psychiatric/Behavioral: Negative.     Physical Exam  Constitutional: She is oriented to person, place, and time. She appears well-developed and well-nourished.  Cardiovascular: Normal rate, regular rhythm, normal heart sounds and intact distal pulses.   Respiratory: Effort normal and breath sounds normal.  GI: Soft. Bowel sounds are normal.  Musculoskeletal: She exhibits edema.  Left leg larger than right.  Weeping noted to both legs. No dressing at this time.  Patient removed because dressing were "wet"   Neurological: She is alert and oriented to person, place, and time.  Skin: Skin is warm. There is erythema.  Weeping legs bilateral.  Right lower leg with dry, cracked skin.  Skin intact to foot.   Left leg double the size of the right.  On the posterior left foot pad behind great toe with a quarter size open would with white moist edges.  Center is dark brown.  Left leg with with open areas oozing  With cracked dry skin on shin.   Psychiatric: She has a normal mood and affect. Her behavior is normal. Judgment and thought content normal.    Encounter Medications:  Outpatient Encounter Prescriptions as of 10/01/2016  Medication Sig  . albuterol (PROVENTIL HFA;VENTOLIN HFA) 108 (90 Base) MCG/ACT inhaler Inhale 1 puff into the lungs every 6 (six) hours as needed for wheezing or shortness of breath. Reported on 09/06/2015  . albuterol (PROVENTIL) (2.5 MG/3ML) 0.083% nebulizer solution 2.46m per 387mevery 6 hours as needed for shortness of breath/wheezing  . aspirin 81 MG tablet Take 81 mg by mouth daily.  . Blood Glucose Monitoring Suppl (ACCU-CHEK NANO SMARTVIEW) w/Device KIT Check blood sugar 2-3 times daily DX E11.9  . carvedilol (COREG) 6.25 MG tablet  Take 1 tablet (6.25 mg total) by mouth 2 (two) times daily with a meal.  . insulin NPH Human (HUMULIN N,NOVOLIN N) 100 UNIT/ML injection INJECT 25 UNITS INTO THE SKIN EACH MORNING AND 12 UNITS INTO THE SKIN EACH NIGHT  . levothyroxine (SYNTHROID, LEVOTHROID) 150 MCG tablet Take 150 mcg by mouth daily before breakfast.  . lisinopril (PRINIVIL,ZESTRIL) 10 MG tablet Take 1 tablet (10 mg total) by mouth daily.  . Marland KitchenCCU-CHEK FASTCLIX LANCETS MISC Check blood sugar 2-3 times daily DX E11.9 (Patient not taking: Reported on 10/01/2016)  . ACCU-CHEK SMARTVIEW test strip Check blood sugar 2-3 times daily DX E11.9 (Patient not taking: Reported on 10/01/2016)  . Doxepin HCl (ZONALON) 5 % CREA Apply topically. Apply to skin every 6 hours as needed for itch, max of 8 days  . levothyroxine (SYNTHROID, LEVOTHROID) 137 MCG tablet Take 1 tablet (137 mcg total) by mouth daily. (Patient not taking: Reported on 10/01/2016)  . Multiple Vitamin (MULTI VITAMIN PO) Take 1 tablet by mouth daily. Reported on 09/06/2015  . nitroGLYCERIN (NITROSTAT) 0.4 MG SL tablet Place 1 tablet (0.4 mg total) under the tongue every 5 (five) minutes as needed for chest pain. (Patient not taking: Reported on 10/01/2016)  . Omega-3 Fatty Acids (OMEGA 3 PO) Take 1 capsule by mouth daily. Reported on 09/06/2015  . sulfamethoxazole-trimethoprim (BACTRIM DS,SEPTRA DS) 800-160 MG tablet Take 1 tablet by mouth 2 (two) times daily. (Patient not taking: Reported on 08/27/2016)  . triamcinolone cream (KENALOG) 0.1 % Apply 1 application topically 2 (two) times daily. (Patient not taking: Reported on 10/01/2016)   No facility-administered encounter medications on file as of 10/01/2016.     Functional Status:   In your present state of health, do you have any difficulty performing the following activities: 10/01/2016  Hearing? Y  Vision? Y  Difficulty concentrating or making decisions? N  Walking or climbing stairs? Y  Dressing or bathing? N  Doing errands,  shopping? Y  Preparing Food and eating ? Y  Using the Toilet? N  In the past six months, have you accidently leaked urine? N  Do you have problems with loss of bowel control? N  Managing your Medications? Y  Managing your Finances? Y  Housekeeping or managing your Housekeeping? Y  Some recent data might be hidden    Fall/Depression Screening:    PHQ 2/9 Scores 10/01/2016 09/17/2016 09/17/2015 09/08/2015 09/06/2015 08/20/2015  PHQ - 2 Score 0 0 0 0 0 0   Fall Risk  10/01/2016 10/01/2016 08/27/2016 07/28/2016 07/02/2016  Falls in the past year? Yes Yes Yes No No  Number falls in past yr: 2 or more - 1 - -  Injury with Fall? No No No - -  Risk Factor Category  High Fall Risk - - - -  Risk for fall due to : History of fall(s) Impaired vision;History of fall(s);Impaired balance/gait;Impaired mobility - - -  Follow  up Falls prevention discussed - - - -   Assessment:   (1) Reviewed [facility] program with patient and patient has consented. Provided new patient packet, magnet, contact card and [facility] calendar. (2) new wound to the bottom of the left foot. ( see above notes)  Legs weeping and swollen.  Left leg twice the size of the right. Intact distal pulses.  Left leg red up to knee. Patient states that she can not see the wound.  Offered to take a picture and patient consented. Picture consent obtained.  Showed patient picture on smartphone and she asked me to call her daughter and send picture to daughter.    (3) poor living conditions. Patient wants to move closer to her daughter who lives in [place].  (4) Home health nurse doing dressing changes twice week and drawing up insulin.   (5) CBG 87 today.   Felt like it was dropping during home visit and patient rechecked CBG was 69. Patient treated with food and felt better. Patient feels like she is self managing her DM well. Reports a1c of 8.1.  (6) patient states that she is blind and she has difficulty with cooking and daily management due to poor vision.    Plan:  (1) [facility] consent taken to the office by [facility] social worker to have scanned in to chart. Picture consent to be scanned into chart as well. (2) reported new wound to home health nurse Claiborne Billings at [phone]. Claiborne Billings reports that she will see patient later today.  Placed call to MD office and reported findings to [facility] and Coralyn Mark at [phone] MD office with no available appointments until Monday.  Also wanted to increase dressing changes to 3 times per week.  Encouraged MD office to send order to home health company.  Coralyn Mark from MD office to call daughter as well.  MD office requested home health nurse to send patient to urgent care if nurse thought necessary after home visit at 3pm today.  I attempted to reach nurse who's voice mail was full . Please call to office and left a message with Langley Gauss who will send nurse an email. Sent picture to daughter who confirmed her receipt. No patient identifiers.  (3) [place] social worker to review resources. Daughter would like patient to be closer so she could assist more with care. (4) Spoke with home health nurse about condition and concerns. Will send a referral to [place] to review any assistance with helping patient draw up insulin once home health is completed.  Daughter also requested pharmacy assistance with drawing up insulin.  (5)Encouraged patient to eat regular meals. Provided DM packet to patient however patient states she knows what to do to manage her DM, (6) [place] social worker to assist with resources to improve home life.    Telephone follow up planned for 2 weeks.   Care planning and goals setting during home visit and patients primary goal is for wound healing.  Telephone update provided to daughter and [facility] social worker.  Daughter states patient to see primary MD on 10/05/2016 at 1:30.  Encouraged daughter to take patient to the ED if worsening condition of the weekend.  Daughter agreed.  Will send this note to MD and barrier letter to  MD.  [facility] CM Care Plan Problem One     Most Recent Value  Care Plan Problem One  Alteration in skin intergrity due to DM as evidence by open wounds to legs.   Role Documenting the Problem One  Care Management Coordinator  Care Plan for Problem One  Active  [facility] Long Term Goal (31-90 days)  Patient will report healing of wounds in the next 31 days.  [facility] Long Term Goal Start Date  10/01/16  Interventions for Problem One Long Term Goal  MD notified of wounds. Collabative discussion with [facility] nurse. daughter made aware of wounds.   [facility] CM Short Term Goal #1 (0-30 days)  Patient will report leaving wounds wrapped in between home health nurse dressing changes for the next 2 weeks.   [facility] CM Short Term Goal #1 Start Date  10/01/16  Interventions for Short Term Goal #1  Reviewed importance of keeping dressing on legs to avoid infection and promote healing. Encouraged daughter to support patient and encouraged patient not to remove dressings.   [facility] CM Short Term Goal #2 (0-30 days)  Patient will report no falls in the next 30 days.   [facility] CM Short Term Goal #2 Start Date  10/01/16  Interventions for Short Term Goal #2  Reviewed fall precautions with patient and the importance of using walker and wearing shoes.      Tomasa Rand, RN, BSN, CEN [facility] [employer] [phone]

## 2016-10-01 NOTE — Telephone Encounter (Signed)
I spoke with Tara Flores to let her know that there are no available appointments tomorrow but patient could be scheduled for Monday. Tara Flores instructed me to tell Tara Flores that if the wound needed immediate attention, then patient should go to urgent care and then follow up in office Monday.   I then spoke with patient's daughter and scheduled appointment for Monday afternoon. She was also instructed to take patient to urgent care if wound needed immediate attention. She stated that she would most likely take patient to urgent care just to have wound checked.   I spoke with Tara Asp at [facility] to give a verbal order to increase home health visits to 3 times weekly.

## 2016-10-01 NOTE — Telephone Encounter (Signed)
I thought she was getting home health 3x weekly. We can increase this but would also like follow up appt in office asap

## 2016-10-05 ENCOUNTER — Encounter: Payer: Self-pay | Admitting: Nurse Practitioner

## 2016-10-05 ENCOUNTER — Ambulatory Visit
Admission: RE | Admit: 2016-10-05 | Discharge: 2016-10-05 | Disposition: A | Discharge status: Home / Self Care | Payer: Medicare Other | Source: Ambulatory Visit | Attending: Nurse Practitioner | Admitting: Nurse Practitioner

## 2016-10-05 ENCOUNTER — Ambulatory Visit (INDEPENDENT_AMBULATORY_CARE_PROVIDER_SITE_OTHER): Payer: Medicare Other | Admitting: Nurse Practitioner

## 2016-10-05 VITALS — BP 128/82 | HR 75 | Temp 97.9°F | Resp 18 | Ht 61.0 in | Wt 142.8 lb

## 2016-10-05 DIAGNOSIS — Z794 Long term (current) use of insulin: Secondary | ICD-10-CM | POA: Diagnosis not present

## 2016-10-05 DIAGNOSIS — I83029 Varicose veins of left lower extremity with ulcer of unspecified site: Secondary | ICD-10-CM | POA: Diagnosis not present

## 2016-10-05 DIAGNOSIS — L97929 Non-pressure chronic ulcer of unspecified part of left lower leg with unspecified severity: Secondary | ICD-10-CM | POA: Diagnosis not present

## 2016-10-05 DIAGNOSIS — E118 Type 2 diabetes mellitus with unspecified complications: Secondary | ICD-10-CM

## 2016-10-05 DIAGNOSIS — E11621 Type 2 diabetes mellitus with foot ulcer: Secondary | ICD-10-CM | POA: Diagnosis not present

## 2016-10-05 DIAGNOSIS — L97528 Non-pressure chronic ulcer of other part of left foot with other specified severity: Secondary | ICD-10-CM | POA: Diagnosis not present

## 2016-10-05 NOTE — Progress Notes (Signed)
Careteam: Patient Care Team: Lauree Chandler, NP as PCP - General (Nurse Practitioner) Hayden Pedro, MD as Consulting Physician (Ophthalmology) Jacelyn Pi, MD as Consulting Physician (Endocrinology) Deirdre Peer, LCSW as [place], RN as [facility]  Advanced Directive information Does Patient Have a Medical Advance Directive?: No  Allergies  Allergen Reactions  . Brethine [Terbutaline]     Made patient confused  . Keflex [Cephalexin] Nausea Only    Loss of appetite    Chief Complaint  Patient presents with  . Acute Visit    Pt is being seen due to a wound on bottom of left foot that was first noticed 7 days ago.      HPI: Patient is a 81 y.o. female seen in the office today due to new wound on the bottom of her left foot. Reports increase in pain for about 10 days. Home health looked at foot and noted black area. Today foot is painful and warm. Pt is blind so unable to assess her own legs/foot. Reports it has been painful when she walks on it but fine if she is sitting down so she has just been elevating leg and this helps to relieve the pain.  Home health has been coming out twice weekly for venous ulcers of bilateral legs which have been improving. Pt has been prompted to go to wound care center since the formation of venous ulcers but has refused to go in the past and has had home health nursing to help with venous ulcers.  Pt with known venous insufficiency and arterial disease, also diabetic followed by endocrinology.  Pt reports she is going to be moving closer to her daughter who is at visit today due to her home being condemned.  Pt was also referred to [facility] due to multiple health problems. She originally declined request but daughter has now gotten involved and they are using these resources.    Review of Systems:  Review of Systems  Constitutional: Negative for activity change,  appetite change, chills, diaphoresis, fatigue and fever.  HENT: Negative for congestion, postnasal drip, sinus pressure, sneezing and sore throat.   Eyes: Negative for discharge, redness and itching.  Respiratory: Negative for cough, chest tightness, shortness of breath and wheezing.   Cardiovascular: Positive for leg swelling. Negative for chest pain and palpitations.       Left lower extremity swelling  Gastrointestinal: Negative for abdominal distention, abdominal pain, constipation, diarrhea and nausea.  Skin: Positive for color change and wound.  Psychiatric/Behavioral: Negative for behavioral problems and sleep disturbance. The patient is not nervous/anxious.     Past Medical History:  Diagnosis Date  . Anxiety   . Arthritis   . Asthma   . CAD (coronary artery disease)   . Cardiac disease   . COPD (chronic obstructive pulmonary disease) ([place])   . Depression   . Diabetes mellitus without complication ([place])   . Hyperlipidemia   . Hypertension   . Hypothyroidism    Past Surgical History:  Procedure Laterality Date  . APPENDECTOMY    . CARDIAC CATHETERIZATION N/A 02/05/2015   Procedure: Left Heart Cath and Coronary Angiography;  Surgeon: Burnell Blanks, MD;  Location: [facility];  Service: Cardiovascular;  Laterality: N/A;  . EYE SURGERY  2006   unsure if exact procedure  . HIP FRACTURE SURGERY    . INTRAMEDULLARY (IM) NAIL INTERTROCHANTERIC Right 02/06/2015   Procedure: INTRAMEDULLARY (IM)  NAIL RIGHT HIP;  Surgeon: Leandrew Koyanagi, MD;  Location: [facility];  Service: Orthopedics;  Laterality: Right;  . JOINT REPLACEMENT     Social History:   reports that she has been smoking.  She has a 63.00 pack-year smoking history. She has never used smokeless tobacco. She reports that she drinks alcohol. She reports that she does not use drugs.  Family History  Problem Relation Age of Onset  . Alcohol abuse Father   . Cancer Mother     lung  . Heart disease Mother   .  Diabetes Sister   . Heart disease Sister     Medications: Patient's Medications  New Prescriptions   No medications on file  Previous Medications   ACCU-CHEK FASTCLIX LANCETS MISC    Check blood sugar 2-3 times daily DX E11.9   ACCU-CHEK SMARTVIEW TEST STRIP    Check blood sugar 2-3 times daily DX E11.9   ALBUTEROL (PROVENTIL HFA;VENTOLIN HFA) 108 (90 BASE) MCG/ACT INHALER    Inhale 1 puff into the lungs every 6 (six) hours as needed for wheezing or shortness of breath. Reported on 09/06/2015   ALBUTEROL (PROVENTIL) (2.5 MG/3ML) 0.083% NEBULIZER SOLUTION    2.45m per 34mevery 6 hours as needed for shortness of breath/wheezing   ASPIRIN 81 MG TABLET    Take 81 mg by mouth daily.   BLOOD GLUCOSE MONITORING SUPPL (ACCU-CHEK NANO SMARTVIEW) W/DEVICE KIT    Check blood sugar 2-3 times daily DX E11.9   CARVEDILOL (COREG) 6.25 MG TABLET    Take 1 tablet (6.25 mg total) by mouth 2 (two) times daily with a meal.   INSULIN NPH HUMAN (HUMULIN N,NOVOLIN N) 100 UNIT/ML INJECTION    INJECT 25 UNITS INTO THE SKIN EACH MORNING AND 12 UNITS INTO THE SKIN EACH NIGHT   LEVOTHYROXINE (SYNTHROID, LEVOTHROID) 150 MCG TABLET    Take 150 mcg by mouth daily before breakfast.   LISINOPRIL (PRINIVIL,ZESTRIL) 10 MG TABLET    Take 1 tablet (10 mg total) by mouth daily.   MULTIPLE VITAMIN (MULTI VITAMIN PO)    Take 1 tablet by mouth daily. Reported on 09/06/2015   NITROGLYCERIN (NITROSTAT) 0.4 MG SL TABLET    Place 1 tablet (0.4 mg total) under the tongue every 5 (five) minutes as needed for chest pain.   OMEGA-3 FATTY ACIDS (OMEGA 3 PO)    Take 1 capsule by mouth daily. Reported on 09/06/2015   TRIAMCINOLONE CREAM (KENALOG) 0.1 %    Apply 1 application topically 2 (two) times daily.  Modified Medications   No medications on file  Discontinued Medications   DOXEPIN HCL (ZONALON) 5 % CREA    Apply topically. Apply to skin every 6 hours as needed for itch, max of 8 days   LEVOTHYROXINE (SYNTHROID, LEVOTHROID) 137 MCG  TABLET    Take 1 tablet (137 mcg total) by mouth daily.   SULFAMETHOXAZOLE-TRIMETHOPRIM (BACTRIM DS,SEPTRA DS) 800-160 MG TABLET    Take 1 tablet by mouth 2 (two) times daily.     Physical Exam:  Vitals:   10/05/16 1340  BP: 128/82  Pulse: 75  Resp: 18  Temp: 97.9 F (36.6 C)  TempSrc: Oral  SpO2: 96%  Weight: 142 lb 12.8 oz (64.8 kg)  Height: 5' 1"  (1.549 m)   Body mass index is 26.98 kg/m.  Physical Exam  Constitutional: She is oriented to person, place, and time. She appears well-developed and well-nourished. No distress.  HENT:  Head: Normocephalic and atraumatic.  Mouth/Throat: Oropharynx  is clear and moist.  Eyes: Pupils are equal, round, and reactive to light. Right eye exhibits no discharge. Left eye exhibits no discharge.  Neck: Normal range of motion.  Cardiovascular: Normal rate and normal heart sounds.   No murmur heard. Pulses:      Radial pulses are 2+ on the right side, and 2+ on the left side.       Dorsalis pedis pulses are 1+ on the right side, and 1+ on the left side.  Pulmonary/Chest: Effort normal. No tachypnea. No respiratory distress. She has no wheezes. She has no rales. She exhibits no tenderness.  Abdominal: Soft. Bowel sounds are normal. She exhibits no distension. There is no tenderness. There is no guarding.  Musculoskeletal: She exhibits edema.  Lymphadenopathy:    She has no cervical adenopathy.  Neurological: She is alert and oriented to person, place, and time.  Skin: Skin is warm. She is not diaphoretic.  Pt has left greater than right reddened venous skin. Few  Ulcerations noted with mild skin sloughing which overall has improved bilateral.  There are areas of healing and scabbing bilaterally. Edema greater to left than right. Black ulceration 1.5 cm X 2 cm surrounded by white tissue noted to left foot plantar surface of left 1st MTP joint. Smaller linear 0.5 cm ulcer noted to 5th MTP area    Labs reviewed: Basic Metabolic  Panel:  Recent Labs  10/17/15 1550 12/24/15 2041 06/08/16 1533 07/24/16  NA 139 134* 135  --   K 4.6 3.5 3.7  --   CL 97 100* 101  --   CO2 25 26 28   --   GLUCOSE 103* 89 116*  --   BUN 16 10 20   --   CREATININE 0.56* 0.54 0.86  --   CALCIUM 9.3 9.2 8.1*  --   TSH  --   --  18.04* 8.57*   Liver Function Tests:  Recent Labs  12/24/15 2041 06/08/16 1533  AST 16 24  ALT 12* 16  ALKPHOS 81 100  BILITOT 0.8 0.3  PROT 7.1 6.1  ALBUMIN 3.6 3.1*    Recent Labs  12/24/15 2117  LIPASE 20   No results for input(s): AMMONIA in the last 8760 hours. CBC:  Recent Labs  12/24/15 2041 06/08/16 1533  WBC 7.1 7.1  NEUTROABS  --  4,047  HGB 13.7 11.7  HCT 40.0 35.0  MCV 95.7 95.4  PLT 251 219   Lipid Panel: No results for input(s): CHOL, HDL, LDLCALC, TRIG, CHOLHDL, LDLDIRECT in the last 8760 hours. TSH:  Recent Labs  06/08/16 1533 07/24/16  TSH 18.04* 8.57*   A1C: Lab Results  Component Value Date   HGBA1C 8.9 (H) 10/17/2015     Assessment/Plan 1. Venous ulcer of left leg (HCC) overal leg ulcers are looking better after competition of Levaquin and with home health xeroform dressing with abd - Ambulatory referral to Vascular Surgery - AMB referral to wound care center  2. Type 2 diabetes mellitus with complication, with long-term current use of insulin (HCC) Has been uncontrolled and she is very hard to control due to hypoglycemic episodes with hyperglycemia, suspect she is not eating correcting and possible compliance issue with medication. Plans to move closer to daughter. conts to follow with Dr Chalmers Cater endocrine for management.   3. Diabetic ulcer of toe of left foot associated with type 2 diabetes mellitus, with other ulcer severity ([place]) New foot ulcers of plantar surface of the foot. Diabetic vs arterial ulcer  due to pain. Pt did not previous want to go to wound care center for management of vascular ulcers despite being advised to go. Daughter here today  and states that she will make sure she goes at this time.  - DG Foot Complete Left; Future rule out osteomyelitis.  - Ambulatory referral to Vascular Surgery- pt with known vascular disease. Will consult to evaluate blood flow due to new ulcer  - AMB referral to wound care center  Dr Mariea Clonts was available to look at ulcers and agreeable to plan Janett Billow K. Harle Battiest  [facility] [phone] 8 am - 5 pm) [phone] (after hours)

## 2016-10-06 ENCOUNTER — Other Ambulatory Visit: Payer: Self-pay | Admitting: Nurse Practitioner

## 2016-10-06 ENCOUNTER — Other Ambulatory Visit: Payer: Self-pay | Admitting: Pharmacist

## 2016-10-06 DIAGNOSIS — E11621 Type 2 diabetes mellitus with foot ulcer: Secondary | ICD-10-CM

## 2016-10-06 DIAGNOSIS — L97528 Non-pressure chronic ulcer of other part of left foot with other specified severity: Principal | ICD-10-CM

## 2016-10-06 NOTE — Patient Outreach (Signed)
[facility]) Care Management  [place]   10/06/2016  CLAIR BARDWELL 1935-10-19 [id number]  Late entry for 10/06/16.    Subjective:  Patient was referred to [facility] by [place] for concerns with drawing up insulin.  Phone call with patient on 10/06/16, HIPAA details verified.     Patient reports home health is currently drawing up her insulin syringes.   Patient reports her daughter is a [employer] scientist and she wonders if her daughter will help her draw insulin syringes.  She reports she is using the vials and purchasing from [employer] due to affordability.    Objective:   Current Medications: Current Outpatient Prescriptions  Medication Sig Dispense Refill  . ACCU-CHEK FASTCLIX LANCETS MISC Check blood sugar 2-3 times daily DX E11.9 300 each 3  . ACCU-CHEK SMARTVIEW test strip Check blood sugar 2-3 times daily DX E11.9 300 each 3  . albuterol (PROVENTIL HFA;VENTOLIN HFA) 108 (90 Base) MCG/ACT inhaler Inhale 1 puff into the lungs every 6 (six) hours as needed for wheezing or shortness of breath. Reported on 09/06/2015 100 each 3  . albuterol (PROVENTIL) (2.5 MG/3ML) 0.083% nebulizer solution 2.6m per 347mevery 6 hours as needed for shortness of breath/wheezing 90 vial 1  . aspirin 81 MG tablet Take 81 mg by mouth daily.    . Blood Glucose Monitoring Suppl (ACCU-CHEK NANO SMARTVIEW) w/Device KIT Check blood sugar 2-3 times daily DX E11.9 1 kit 0  . carvedilol (COREG) 6.25 MG tablet Take 1 tablet (6.25 mg total) by mouth 2 (two) times daily with a meal. 180 tablet 3  . insulin NPH Human (HUMULIN N,NOVOLIN N) 100 UNIT/ML injection INJECT 25 UNITS INTO THE SKIN EACH MORNING AND 12 UNITS INTO THE SKIN EACH NIGHT    . levothyroxine (SYNTHROID, LEVOTHROID) 150 MCG tablet Take 150 mcg by mouth daily before breakfast.    . lisinopril (PRINIVIL,ZESTRIL) 10 MG tablet Take 1 tablet (10 mg total) by mouth daily. 90 tablet 0  . Multiple Vitamin (MULTI  VITAMIN PO) Take 1 tablet by mouth daily. Reported on 09/06/2015    . nitroGLYCERIN (NITROSTAT) 0.4 MG SL tablet Place 1 tablet (0.4 mg total) under the tongue every 5 (five) minutes as needed for chest pain. 25 tablet 3  . Omega-3 Fatty Acids (OMEGA 3 PO) Take 1 capsule by mouth daily. Reported on 09/06/2015    . triamcinolone cream (KENALOG) 0.1 % Apply 1 application topically 2 (two) times daily. 30 g 1   No current facility-administered medications for this visit.     Functional Status: In your present state of health, do you have any difficulty performing the following activities: 10/01/2016  Hearing? Y  Vision? Y  Difficulty concentrating or making decisions? N  Walking or climbing stairs? Y  Dressing or bathing? N  Doing errands, shopping? Y  Preparing Food and eating ? Y  Using the Toilet? N  In the past six months, have you accidently leaked urine? N  Do you have problems with loss of bowel control? N  Managing your Medications? Y  Managing your Finances? Y  Housekeeping or managing your Housekeeping? Y  Some recent data might be hidden    Fall/Depression Screening: PHQ 2/9 Scores 10/01/2016 09/17/2016 09/17/2015 09/08/2015 09/06/2015 08/20/2015  PHQ - 2 Score 0 0 0 0 0 0    Assessment:  Medication review per patient report and reviewing medication list in this chart:   Drugs sorted by system:  Cardiovascular: -aspirin  81 mg -carvedilol -lisinopril -sublingual nitroglycerin   Pulmonary/Allergy: -albuterol nebs -albuterol HFA---patient reports she uses when not at home/away from nebulizer machine  Endocrine: -insulin NPH (Novolin N)  -levothyroxine  Topical: -triamcinolone---patient reports not using at this time   Vitamins/Minerals: -multivitamin  -omega 3 fatty acid (fish oil)   Gaps in therapy:  -consider if a statin is appropriate in patient with diabetes  Medication administration: -Discussed SSA Extra Help requirements---patient reports income exceeds  requirements -Discussed manufacturer patient assistance program requirements for [employer] and [employer] to try to get patient insulin pens----she reports she has not met out-of-pocket spend requirement -Discussed [employer] [facility]) ---for a sliding scale fee based on income, they may provide insulin syringe filling services----patient reports she doesn't wish to pay for this  Counseled patient concern is what happens if she has difficulty drawing insulin dose up and home health is not available or she has continued difficulty seeing---patient again reports she will not be willing to pay for [facility] and reports insulin pens are cost prohibitive.   Patient provided verbal consent over phone to [facility] Pharmacist to contact her daughter, Elen, whom is also listed on [facility] Consent form.    Left a message for [place] on 10/06/16 requesting a return call to discuss options for insulin management with patient's daughter.   Plan:  Will route note to patient's PCP.   Will make another attempt to outreach patient's daughter within the next week if no return call from her.   Karrie Meres, PharmD, [place] [phone]

## 2016-10-09 ENCOUNTER — Inpatient Hospital Stay (HOSPITAL_COMMUNITY): Payer: Medicare Other

## 2016-10-09 ENCOUNTER — Inpatient Hospital Stay (HOSPITAL_COMMUNITY)
Admission: EM | Admit: 2016-10-09 | Discharge: 2016-10-14 | DRG: 616 | Disposition: A | Discharge status: Skilled Nursing Facility | Payer: Medicare Other | Attending: Internal Medicine | Admitting: Internal Medicine

## 2016-10-09 ENCOUNTER — Telehealth: Payer: Self-pay

## 2016-10-09 ENCOUNTER — Ambulatory Visit: Payer: Self-pay | Admitting: Pharmacist

## 2016-10-09 DIAGNOSIS — M86672 Other chronic osteomyelitis, left ankle and foot: Secondary | ICD-10-CM | POA: Diagnosis not present

## 2016-10-09 DIAGNOSIS — Z794 Long term (current) use of insulin: Secondary | ICD-10-CM

## 2016-10-09 DIAGNOSIS — E1151 Type 2 diabetes mellitus with diabetic peripheral angiopathy without gangrene: Secondary | ICD-10-CM | POA: Diagnosis present

## 2016-10-09 DIAGNOSIS — Z8249 Family history of ischemic heart disease and other diseases of the circulatory system: Secondary | ICD-10-CM

## 2016-10-09 DIAGNOSIS — E119 Type 2 diabetes mellitus without complications: Secondary | ICD-10-CM

## 2016-10-09 DIAGNOSIS — Z7982 Long term (current) use of aspirin: Secondary | ICD-10-CM

## 2016-10-09 DIAGNOSIS — D631 Anemia in chronic kidney disease: Secondary | ICD-10-CM | POA: Diagnosis present

## 2016-10-09 DIAGNOSIS — L03116 Cellulitis of left lower limb: Secondary | ICD-10-CM | POA: Diagnosis present

## 2016-10-09 DIAGNOSIS — N189 Chronic kidney disease, unspecified: Secondary | ICD-10-CM | POA: Diagnosis present

## 2016-10-09 DIAGNOSIS — L97401 Non-pressure chronic ulcer of unspecified heel and midfoot limited to breakdown of skin: Secondary | ICD-10-CM | POA: Diagnosis not present

## 2016-10-09 DIAGNOSIS — Z720 Tobacco use: Secondary | ICD-10-CM | POA: Diagnosis present

## 2016-10-09 DIAGNOSIS — E11621 Type 2 diabetes mellitus with foot ulcer: Secondary | ICD-10-CM | POA: Diagnosis present

## 2016-10-09 DIAGNOSIS — S91332A Puncture wound without foreign body, left foot, initial encounter: Secondary | ICD-10-CM

## 2016-10-09 DIAGNOSIS — I2510 Atherosclerotic heart disease of native coronary artery without angina pectoris: Secondary | ICD-10-CM | POA: Diagnosis present

## 2016-10-09 DIAGNOSIS — E1169 Type 2 diabetes mellitus with other specified complication: Secondary | ICD-10-CM | POA: Diagnosis present

## 2016-10-09 DIAGNOSIS — M009 Pyogenic arthritis, unspecified: Secondary | ICD-10-CM | POA: Diagnosis present

## 2016-10-09 DIAGNOSIS — E222 Syndrome of inappropriate secretion of antidiuretic hormone: Secondary | ICD-10-CM | POA: Diagnosis present

## 2016-10-09 DIAGNOSIS — L97529 Non-pressure chronic ulcer of other part of left foot with unspecified severity: Secondary | ICD-10-CM | POA: Diagnosis present

## 2016-10-09 DIAGNOSIS — M79671 Pain in right foot: Secondary | ICD-10-CM | POA: Diagnosis present

## 2016-10-09 DIAGNOSIS — E1165 Type 2 diabetes mellitus with hyperglycemia: Secondary | ICD-10-CM | POA: Diagnosis present

## 2016-10-09 DIAGNOSIS — M25475 Effusion, left foot: Secondary | ICD-10-CM | POA: Diagnosis present

## 2016-10-09 DIAGNOSIS — I10 Essential (primary) hypertension: Secondary | ICD-10-CM | POA: Diagnosis present

## 2016-10-09 DIAGNOSIS — E1122 Type 2 diabetes mellitus with diabetic chronic kidney disease: Secondary | ICD-10-CM | POA: Diagnosis present

## 2016-10-09 DIAGNOSIS — J449 Chronic obstructive pulmonary disease, unspecified: Secondary | ICD-10-CM | POA: Diagnosis present

## 2016-10-09 DIAGNOSIS — E039 Hypothyroidism, unspecified: Secondary | ICD-10-CM | POA: Diagnosis present

## 2016-10-09 DIAGNOSIS — L97519 Non-pressure chronic ulcer of other part of right foot with unspecified severity: Secondary | ICD-10-CM | POA: Diagnosis not present

## 2016-10-09 DIAGNOSIS — E876 Hypokalemia: Secondary | ICD-10-CM | POA: Diagnosis present

## 2016-10-09 DIAGNOSIS — Z9889 Other specified postprocedural states: Secondary | ICD-10-CM

## 2016-10-09 DIAGNOSIS — Z01810 Encounter for preprocedural cardiovascular examination: Secondary | ICD-10-CM | POA: Diagnosis not present

## 2016-10-09 DIAGNOSIS — E785 Hyperlipidemia, unspecified: Secondary | ICD-10-CM | POA: Diagnosis present

## 2016-10-09 DIAGNOSIS — Z881 Allergy status to other antibiotic agents status: Secondary | ICD-10-CM

## 2016-10-09 DIAGNOSIS — M869 Osteomyelitis, unspecified: Secondary | ICD-10-CM | POA: Diagnosis present

## 2016-10-09 DIAGNOSIS — A480 Gas gangrene: Secondary | ICD-10-CM | POA: Diagnosis present

## 2016-10-09 DIAGNOSIS — E118 Type 2 diabetes mellitus with unspecified complications: Secondary | ICD-10-CM

## 2016-10-09 DIAGNOSIS — F329 Major depressive disorder, single episode, unspecified: Secondary | ICD-10-CM | POA: Diagnosis present

## 2016-10-09 DIAGNOSIS — L97509 Non-pressure chronic ulcer of other part of unspecified foot with unspecified severity: Secondary | ICD-10-CM

## 2016-10-09 DIAGNOSIS — E1142 Type 2 diabetes mellitus with diabetic polyneuropathy: Secondary | ICD-10-CM | POA: Diagnosis present

## 2016-10-09 DIAGNOSIS — L03119 Cellulitis of unspecified part of limb: Secondary | ICD-10-CM | POA: Diagnosis not present

## 2016-10-09 DIAGNOSIS — Z79899 Other long term (current) drug therapy: Secondary | ICD-10-CM

## 2016-10-09 DIAGNOSIS — Z66 Do not resuscitate: Secondary | ICD-10-CM | POA: Diagnosis present

## 2016-10-09 DIAGNOSIS — I70209 Unspecified atherosclerosis of native arteries of extremities, unspecified extremity: Secondary | ICD-10-CM | POA: Diagnosis not present

## 2016-10-09 DIAGNOSIS — F17210 Nicotine dependence, cigarettes, uncomplicated: Secondary | ICD-10-CM | POA: Diagnosis present

## 2016-10-09 DIAGNOSIS — Z888 Allergy status to other drugs, medicaments and biological substances status: Secondary | ICD-10-CM

## 2016-10-09 DIAGNOSIS — F419 Anxiety disorder, unspecified: Secondary | ICD-10-CM | POA: Diagnosis present

## 2016-10-09 DIAGNOSIS — L539 Erythematous condition, unspecified: Secondary | ICD-10-CM

## 2016-10-09 DIAGNOSIS — M86679 Other chronic osteomyelitis, unspecified ankle and foot: Secondary | ICD-10-CM | POA: Diagnosis not present

## 2016-10-09 DIAGNOSIS — M79673 Pain in unspecified foot: Secondary | ICD-10-CM

## 2016-10-09 DIAGNOSIS — E11628 Type 2 diabetes mellitus with other skin complications: Secondary | ICD-10-CM | POA: Diagnosis present

## 2016-10-09 DIAGNOSIS — Z833 Family history of diabetes mellitus: Secondary | ICD-10-CM

## 2016-10-09 DIAGNOSIS — R0602 Shortness of breath: Secondary | ICD-10-CM

## 2016-10-09 DIAGNOSIS — E08621 Diabetes mellitus due to underlying condition with foot ulcer: Secondary | ICD-10-CM | POA: Diagnosis not present

## 2016-10-09 LAB — COMPREHENSIVE METABOLIC PANEL
ALBUMIN: 2.6 g/dL — AB (ref 3.5–5.0)
ALT: 16 U/L (ref 14–54)
AST: 28 U/L (ref 15–41)
Alkaline Phosphatase: 138 U/L — ABNORMAL HIGH (ref 38–126)
Anion gap: 10 (ref 5–15)
BUN: 17 mg/dL (ref 6–20)
CHLORIDE: 93 mmol/L — AB (ref 101–111)
CO2: 25 mmol/L (ref 22–32)
Calcium: 8.3 mg/dL — ABNORMAL LOW (ref 8.9–10.3)
Creatinine, Ser: 0.67 mg/dL (ref 0.44–1.00)
GFR calc Af Amer: >60 mL/min (ref >60)
GFR calc non Af Amer: >60 mL/min (ref >60)
Glucose, Bld: 193 mg/dL — ABNORMAL HIGH (ref 65–99)
POTASSIUM: 3.1 mmol/L — AB (ref 3.5–5.1)
Sodium: 128 mmol/L — ABNORMAL LOW (ref 135–145)
Total Bilirubin: 0.5 mg/dL (ref 0.3–1.2)
Total Protein: 7 g/dL (ref 6.5–8.1)

## 2016-10-09 LAB — CBC WITH DIFFERENTIAL/PLATELET
Basophils Absolute: 0 10*3/uL (ref 0.0–0.1)
Basophils Relative: 0 %
EOS PCT: 1 %
Eosinophils Absolute: 0.1 10*3/uL (ref 0.0–0.7)
HCT: 34.2 % — ABNORMAL LOW (ref 36.0–46.0)
Hemoglobin: 11.8 g/dL — ABNORMAL LOW (ref 12.0–15.0)
LYMPHS ABS: 0.7 10*3/uL (ref 0.7–4.0)
LYMPHS PCT: 4 %
MCH: 31.6 pg (ref 26.0–34.0)
MCHC: 34.5 g/dL (ref 30.0–36.0)
MCV: 91.7 fL (ref 78.0–100.0)
MONO ABS: 1.5 10*3/uL — AB (ref 0.1–1.0)
Monocytes Relative: 10 %
Neutro Abs: 12.9 10*3/uL — ABNORMAL HIGH (ref 1.7–7.7)
Neutrophils Relative %: 85 %
PLATELETS: 295 10*3/uL (ref 150–400)
RBC: 3.73 MIL/uL — AB (ref 3.87–5.11)
RDW: 12.4 % (ref 11.5–15.5)
WBC: 15.1 10*3/uL — ABNORMAL HIGH (ref 4.0–10.5)

## 2016-10-09 LAB — I-STAT CG4 LACTIC ACID, ED
Lactic Acid, Venous: 1.19 mmol/L (ref 0.5–1.9)
Lactic Acid, Venous: 0.83 mmol/L (ref 0.5–1.9)

## 2016-10-09 MED ORDER — VANCOMYCIN HCL 10 G IV SOLR
1250.0000 mg | Freq: Once | INTRAVENOUS | Status: AC
  Administered 2016-10-10: 1250 mg via INTRAVENOUS
  Filled 2016-10-09: qty 1250

## 2016-10-09 MED ORDER — SODIUM CHLORIDE 0.9 % IV BOLUS (SEPSIS)
1000.0000 mL | Freq: Once | INTRAVENOUS | Status: AC
  Administered 2016-10-09: 1000 mL via INTRAVENOUS

## 2016-10-09 MED ORDER — METRONIDAZOLE IN NACL 5-0.79 MG/ML-% IV SOLN
500.0000 mg | Freq: Three times a day (TID) | INTRAVENOUS | Status: DC
  Administered 2016-10-10: 500 mg via INTRAVENOUS
  Filled 2016-10-09 (×4): qty 100

## 2016-10-09 MED ORDER — ACETAMINOPHEN 325 MG PO TABS
650.0000 mg | ORAL_TABLET | Freq: Four times a day (QID) | ORAL | Status: DC | PRN
  Administered 2016-10-10 – 2016-10-13 (×4): 650 mg via ORAL
  Filled 2016-10-09 (×5): qty 2

## 2016-10-09 MED ORDER — LISINOPRIL 10 MG PO TABS
10.0000 mg | ORAL_TABLET | Freq: Every day | ORAL | Status: DC
  Administered 2016-10-10 – 2016-10-14 (×5): 10 mg via ORAL
  Filled 2016-10-09 (×5): qty 1

## 2016-10-09 MED ORDER — ONDANSETRON HCL 4 MG/2ML IJ SOLN: 4.0000 mg | Freq: Four times a day (QID) | INTRAMUSCULAR | Status: DC | PRN

## 2016-10-09 MED ORDER — ENOXAPARIN SODIUM 40 MG/0.4ML ~~LOC~~ SOLN
40.0000 mg | Freq: Every day | SUBCUTANEOUS | Status: DC
  Filled 2016-10-09 (×3): qty 0.4

## 2016-10-09 MED ORDER — DIAZEPAM 2 MG PO TABS
2.0000 mg | ORAL_TABLET | Freq: Once | ORAL | Status: AC
Start: 2016-10-09 — End: 2016-10-09
  Administered 2016-10-09: 2 mg via ORAL
  Filled 2016-10-09: qty 1

## 2016-10-09 MED ORDER — ALBUTEROL SULFATE (2.5 MG/3ML) 0.083% IN NEBU
2.5000 mg | INHALATION_SOLUTION | RESPIRATORY_TRACT | Status: AC
  Administered 2016-10-09: 2.5 mg via RESPIRATORY_TRACT
  Filled 2016-10-09: qty 3

## 2016-10-09 MED ORDER — LEVOTHYROXINE SODIUM 75 MCG PO TABS
150.0000 ug | ORAL_TABLET | Freq: Every day | ORAL | Status: DC
Start: 2016-10-10 — End: 2016-10-14
  Administered 2016-10-10 – 2016-10-14 (×5): 150 ug via ORAL
  Filled 2016-10-09: qty 2
  Filled 2016-10-09: qty 6
  Filled 2016-10-09 (×4): qty 2

## 2016-10-09 MED ORDER — LEVOTHYROXINE SODIUM 150 MCG PO TABS
150.0000 ug | ORAL_TABLET | Freq: Every day | ORAL | Status: DC
  Filled 2016-10-09: qty 1

## 2016-10-09 MED ORDER — NICOTINE 21 MG/24HR TD PT24
21.0000 mg | MEDICATED_PATCH | Freq: Once | TRANSDERMAL | Status: AC
  Administered 2016-10-09: 21 mg via TRANSDERMAL
  Filled 2016-10-09: qty 1

## 2016-10-09 MED ORDER — ASPIRIN 81 MG PO CHEW
81.0000 mg | CHEWABLE_TABLET | Freq: Every day | ORAL | Status: DC
  Administered 2016-10-10 – 2016-10-14 (×5): 81 mg via ORAL
  Filled 2016-10-09 (×5): qty 1

## 2016-10-09 MED ORDER — POTASSIUM CHLORIDE CRYS ER 20 MEQ PO TBCR
40.0000 meq | EXTENDED_RELEASE_TABLET | Freq: Once | ORAL | Status: AC
  Administered 2016-10-09: 40 meq via ORAL
  Filled 2016-10-09: qty 2

## 2016-10-09 MED ORDER — ALBUTEROL SULFATE (2.5 MG/3ML) 0.083% IN NEBU
2.5000 mg | INHALATION_SOLUTION | Freq: Four times a day (QID) | RESPIRATORY_TRACT | Status: DC | PRN
  Administered 2016-10-10 – 2016-10-13 (×7): 2.5 mg via RESPIRATORY_TRACT
  Filled 2016-10-09 (×6): qty 3

## 2016-10-09 MED ORDER — ONDANSETRON HCL 4 MG PO TABS: 4.0000 mg | ORAL_TABLET | Freq: Four times a day (QID) | ORAL | Status: DC | PRN

## 2016-10-09 MED ORDER — CARVEDILOL 6.25 MG PO TABS
6.2500 mg | ORAL_TABLET | Freq: Two times a day (BID) | ORAL | Status: DC
  Administered 2016-10-10 – 2016-10-14 (×8): 6.25 mg via ORAL
  Filled 2016-10-09 (×8): qty 1

## 2016-10-09 MED ORDER — CEFEPIME HCL 2 G IJ SOLR
2.0000 g | INTRAMUSCULAR | Status: DC
  Administered 2016-10-10: 2 g via INTRAVENOUS
  Filled 2016-10-09: qty 2

## 2016-10-09 MED ORDER — INSULIN ASPART 100 UNIT/ML ~~LOC~~ SOLN
0.0000 [IU] | Freq: Three times a day (TID) | SUBCUTANEOUS | Status: DC
  Administered 2016-10-10: 11 [IU] via SUBCUTANEOUS
  Administered 2016-10-10: 8 [IU] via SUBCUTANEOUS
  Administered 2016-10-11: 5 [IU] via SUBCUTANEOUS
  Administered 2016-10-11: 8 [IU] via SUBCUTANEOUS
  Administered 2016-10-11: 11 [IU] via SUBCUTANEOUS
  Administered 2016-10-12: 5 [IU] via SUBCUTANEOUS
  Administered 2016-10-13: 8 [IU] via SUBCUTANEOUS
  Administered 2016-10-13 (×2): 5 [IU] via SUBCUTANEOUS
  Administered 2016-10-14: 8 [IU] via SUBCUTANEOUS
  Administered 2016-10-14: 5 [IU] via SUBCUTANEOUS

## 2016-10-09 MED ORDER — DIAZEPAM 2 MG PO TABS
2.0000 mg | ORAL_TABLET | Freq: Three times a day (TID) | ORAL | Status: AC | PRN
  Administered 2016-10-10 (×2): 2 mg via ORAL
  Filled 2016-10-09 (×2): qty 1

## 2016-10-09 MED ORDER — DEXTROSE 5 % IV SOLN: 2.0000 g | Freq: Two times a day (BID) | INTRAVENOUS | Status: DC

## 2016-10-09 MED ORDER — VANCOMYCIN HCL 500 MG IV SOLR
500.0000 mg | Freq: Two times a day (BID) | INTRAVENOUS | Status: DC
  Administered 2016-10-10 – 2016-10-11 (×4): 500 mg via INTRAVENOUS
  Filled 2016-10-09 (×6): qty 500

## 2016-10-09 MED ORDER — INSULIN ASPART 100 UNIT/ML ~~LOC~~ SOLN
0.0000 [IU] | Freq: Every day | SUBCUTANEOUS | Status: DC
  Administered 2016-10-09 – 2016-10-10 (×2): 3 [IU] via SUBCUTANEOUS
  Administered 2016-10-12: 5 [IU] via SUBCUTANEOUS
  Administered 2016-10-13: 2 [IU] via SUBCUTANEOUS

## 2016-10-09 MED ORDER — ACETAMINOPHEN 650 MG RE SUPP: 650.0000 mg | Freq: Four times a day (QID) | RECTAL | Status: DC | PRN

## 2016-10-09 NOTE — Telephone Encounter (Signed)
Spoke with daughter and they are on the way to the emergency room and will call to let us know what's going on.

## 2016-10-09 NOTE — Telephone Encounter (Signed)
I called patient again to confirm that she received message and was in route to the emergency room, no answer. I left message on voicemail with instructions as previously listed.

## 2016-10-09 NOTE — H&P (Signed)
History and Physical    Tara Flores YIR:[id number] DOB: [birth year] DOA: 10/09/2016  Referring MD/NP/PA: Dr. Leonette Monarch PCP: Lauree Chandler, NP  Patient coming from: Home  Chief Complaint: Foot wound  HPI: Tara Flores is a 81 y.o. female with medical history significant of HTN, DM type II, hypothyroidism, CAD, and tobacco abuse; who presents with complaints of worsening right foot wound. History is mostly obtained from the patient's daughter. Approximately 2 week ago she was noted to have a right foot when home health care came by to visit. Patient does not remember stepping on anything or any specific injury to the foot. [facility] came to the house 1 week ago and they were trying get the patient set up with wound care, but the patient has not had that appointment yet. At baseline she is noted to live alone and family is concerned that she does not to live alone at this time. Was taken to PCP on 4 days ago, and x-rays of the right foot could not rule out osteomyelitis. Within the last 2 days she has complained of intermittent fevers noted up to 100.85F, chills, erythema up to the knee, swelling, and pain. Pain symptoms worsened with putting any weight on the affected extremity. Within the last month patient was treated with septra  for cellulitis. Family reports that the patient smokes anywhere from 1-2 packs of cigarettes per day on average.  ED Course: Upon admission to the emergency department patient was seen to be afebrile with blood pressure elevated up to 178/79, and all other vitals relatively within normal limits. Labs revealed WBC 15.1, hemoglobin 11.8, sodium 128, potassium 3.1, Chloride 93, glucose 193, and lactic acid 1.19, and all other labs relatively within normal limits. No imaging studies were obtained. [facility] called to admit.   Review of Systems: As per HPI otherwise 10 point review of systems negative.   Past Medical History:  Diagnosis Date  . Anxiety   . Arthritis   . Asthma     . CAD (coronary artery disease)   . Cardiac disease   . COPD (chronic obstructive pulmonary disease) ([place])   . Depression   . Diabetes mellitus without complication ([place])   . Hyperlipidemia   . Hypertension   . Hypothyroidism     Past Surgical History:  Procedure Laterality Date  . APPENDECTOMY    . CARDIAC CATHETERIZATION N/A 02/05/2015   Procedure: Left Heart Cath and Coronary Angiography;  Surgeon: Burnell Blanks, MD;  Location: [facility];  Service: Cardiovascular;  Laterality: N/A;  . EYE SURGERY  2006   unsure if exact procedure  . HIP FRACTURE SURGERY    . INTRAMEDULLARY (IM) NAIL INTERTROCHANTERIC Right 02/06/2015   Procedure: INTRAMEDULLARY (IM) NAIL RIGHT HIP;  Surgeon: Leandrew Koyanagi, MD;  Location: [facility];  Service: Orthopedics;  Laterality: Right;  . JOINT REPLACEMENT     due to hip fracture     reports that she has been smoking.  She has a 63.00 pack-year smoking history. She has never used smokeless tobacco. She reports that she drinks alcohol. She reports that she does not use drugs.  Allergies  Allergen Reactions  . Brethine [Terbutaline]     Made patient confused  . Keflex [Cephalexin] Nausea Only    Loss of appetite    Family History  Problem Relation Age of Onset  . Alcohol abuse Father   . Cancer Mother     lung  . Heart disease Mother   .  Diabetes Sister   . Heart disease Sister     Prior to Admission medications   Medication Sig Start Date End Date Taking? Authorizing Provider  ACCU-CHEK FASTCLIX LANCETS MISC Check blood sugar 2-3 times daily DX E11.9 09/28/16   Estill Dooms, MD  ACCU-CHEK SMARTVIEW test strip Check blood sugar 2-3 times daily DX E11.9 09/28/16   Estill Dooms, MD  albuterol (PROVENTIL HFA;VENTOLIN HFA) 108 (90 Base) MCG/ACT inhaler Inhale 1 puff into the lungs every 6 (six) hours as needed for wheezing or shortness of breath. Reported on 09/06/2015 09/25/15   Robyn Haber, MD  albuterol (PROVENTIL) (2.5 MG/3ML)  0.083% nebulizer solution 2.85m per 340mevery 6 hours as needed for shortness of breath/wheezing 06/16/16   JeLauree ChandlerNP  aspirin 81 MG tablet Take 81 mg by mouth daily.    Historical Provider, MD  Blood Glucose Monitoring Suppl (ACCU-CHEK NANO SMARTVIEW) w/Device KIT Check blood sugar 2-3 times daily DX E11.9 09/28/16   ArEstill DoomsMD  carvedilol (COREG) 6.25 MG tablet Take 1 tablet (6.25 mg total) by mouth 2 (two) times daily with a meal. 02/04/16   ArEstill DoomsMD  insulin NPH Human (HUMULIN N,NOVOLIN N) 100 UNIT/ML injection INJECT 25 UNITS INTO THE SKIN EACH MORNING AND 12 UNITS INTO THE SKIN EACH NIGHT    Historical Provider, MD  levothyroxine (SYNTHROID, LEVOTHROID) 150 MCG tablet Take 150 mcg by mouth daily before breakfast.    Historical Provider, MD  lisinopril (PRINIVIL,ZESTRIL) 10 MG tablet Take 1 tablet (10 mg total) by mouth daily. 05/11/16   PeJosue HectorMD  Multiple Vitamin (MULTI VITAMIN PO) Take 1 tablet by mouth daily. Reported on 09/06/2015    Historical Provider, MD  nitroGLYCERIN (NITROSTAT) 0.4 MG SL tablet Place 1 tablet (0.4 mg total) under the tongue every 5 (five) minutes as needed for chest pain. Patient not taking: Reported on 10/08/2016 07/18/15   PeJosue HectorMD  Omega-3 Fatty Acids (OMEGA 3 PO) Take 1 capsule by mouth daily. Reported on 09/06/2015    Historical Provider, MD  triamcinolone cream (KENALOG) 0.1 % Apply 1 application topically 2 (two) times daily. Patient not taking: Reported on 10/08/2016 07/28/16   JeLauree ChandlerNP    Physical Exam:  Constitutional: Elderly female who appears to be in moderate discomfort. Vitals:   10/09/16 2030 10/09/16 2045 10/09/16 2100 10/09/16 2115  BP: (!) 178/79 (!) 167/83 (!) 165/78 (!) 177/83  Pulse: 90 90 88 92  Resp:      Temp:      TempSrc:      SpO2: 96% 97% 96% 95%   Eyes: PERRL, lids and conjunctivae normal ENMT: Mucous membranes are moist. Posterior pharynx clear of any exudate or lesions.    Neck: normal, supple, no masses, no thyromegaly Respiratory: clear to auscultation bilaterally, no wheezing, no crackles. Normal respiratory effort. No accessory muscle use.  Cardiovascular: Regular rate and rhythm, no murmurs / rubs / gallops. No extremity edema. 2+ pedal pulses. No carotid bruits.  Abdomen: no tenderness, no masses palpated. No hepatosplenomegaly. Bowel sounds positive.  Musculoskeletal: no clubbing / cyanosis. No joint deformity upper and lower extremities. Good ROM, no contractures. Normal muscle tone.  Skin:Erythema noted of the right foot proximal to right knee. Ulceration noted at the right first toe as seen below.   Neurologic: CN 2-12 grossly intact. Sensation intact, DTR normal. Strength 5/5 in all 4.  Psychiatric: Normal judgment and insight. Alert and oriented x 3.  Anxious mood.     Labs on Admission: I have personally reviewed following labs and imaging studies  CBC:  Recent Labs Lab 10/09/16 1500  WBC 15.1*  NEUTROABS 12.9*  HGB 11.8*  HCT 34.2*  MCV 91.7  PLT 604   Basic Metabolic Panel:  Recent Labs Lab 10/09/16 1500  NA 128*  K 3.1*  CL 93*  CO2 25  GLUCOSE 193*  BUN 17  CREATININE 0.67  CALCIUM 8.3*   GFR: Estimated Creatinine Clearance: 49.1 mL/min (by C-G formula based on SCr of 0.67 mg/dL). Liver Function Tests:  Recent Labs Lab 10/09/16 1500  AST 28  ALT 16  ALKPHOS 138*  BILITOT 0.5  PROT 7.0  ALBUMIN 2.6*   No results for input(s): LIPASE, AMYLASE in the last 168 hours. No results for input(s): AMMONIA in the last 168 hours. Coagulation Profile: No results for input(s): INR, PROTIME in the last 168 hours. Cardiac Enzymes: No results for input(s): CKTOTAL, CKMB, CKMBINDEX, TROPONINI in the last 168 hours. BNP (last 3 results) No results for input(s): PROBNP in the last 8760 hours. HbA1C: No results for input(s): HGBA1C in the last 72 hours. CBG: No results for input(s): GLUCAP in the last 168 hours. Lipid  Profile: No results for input(s): CHOL, HDL, LDLCALC, TRIG, CHOLHDL, LDLDIRECT in the last 72 hours. Thyroid Function Tests: No results for input(s): TSH, T4TOTAL, FREET4, T3FREE, THYROIDAB in the last 72 hours. Anemia Panel: No results for input(s): VITAMINB12, FOLATE, FERRITIN, TIBC, IRON, RETICCTPCT in the last 72 hours. Urine analysis:    Component Value Date/Time   COLORURINE YELLOW 12/24/2015 2055   APPEARANCEUR CLOUDY (A) 12/24/2015 2055   LABSPEC 1.013 12/24/2015 2055   PHURINE 7.5 12/24/2015 2055   GLUCOSEU 100 (A) 12/24/2015 2055   HGBUR NEGATIVE 12/24/2015 2055   BILIRUBINUR NEGATIVE 12/24/2015 2055   [place] NEGATIVE 12/24/2015 2055   PROTEINUR 30 (A) 12/24/2015 2055   UROBILINOGEN 0.2 02/05/2015 0528   NITRITE NEGATIVE 12/24/2015 2055   LEUKOCYTESUR SMALL (A) 12/24/2015 2055   Sepsis Labs: No results found for this or any previous visit (from the past 240 hour(s)).   Radiological Exams on Admission: No results found.   Assessment/Plan Diabetic foot ulcer with right leg cellulitis: Acute. Patient presents with progressively worsening right foot ulcer on the first metatarsal that has worsened. Question possibility of osteomyelitis. - Admit to [facility] - Diabetic foot wound orders initiated - Follow-up blood and wound cultures  - check esr and crp - F/u MRI of foot - Empiric antibiotics of cefepime,, and vancomycin  metronidazole - Consult orthopedics in a.m. if warranted  Leukocytosis: Acute. WBC elevated at 15.1. Suspect secondary to acute infection seen above - Recheckrepeat CBC in a.m.  Hypokalemia: Acute. Initial potassium 3.1 on admission. given 40 mEq of potassium chloride while in the ED. - Continue to monitor and replace as needed  History of CAD - Continue aspirin   Essential hypertension  - continue Coreg  Diabetes mellitus type 2 - Hypoglycemic protocol - Check hemoglobin A1c in a.m. - CBGs with Moderate sliding scale insulin     Hypothyroidism - Check TSH - continue levothyroxine  Anemia: Stable.  Hemoglobin 11.8 on admission which appears to be near patient's baseline. - Continue to monitor   Hyponatremia: Acute on chronic. Sodium 128 - Continue to monitor  Tobacco abuse - Counseled on the need for cessation of tobacco   DVT prophylaxis: lovenox Code Status: DNR  Family Communication: Discussed plan of care with patient and daughter.  Disposition Plan: TBD Consults called:  None  Admission status: Inpatient   Norval Morton MD [facility] Pager [phone]  If 7PM-7AM, please contact night-coverage [email or web address] Password [facility]  10/09/2016, 9:42 PM

## 2016-10-09 NOTE — ED Provider Notes (Addendum)
[facility] DEPT Provider Note   CSN: [id number] Arrival date & time: 10/09/16  1414     History   Chief Complaint Chief Complaint  Patient presents with  . Foot Pain    HPI Tara Flores is a 81 y.o. female.  The history is provided by the patient.  Foot Pain  This is a recurrent problem. Episode onset: 2 weeks. The problem has been rapidly worsening. Pertinent negatives include no chest pain, no abdominal pain, no headaches and no shortness of breath. The symptoms are aggravated by walking. Nothing relieves the symptoms. The treatment provided no relief.   Was treated for cellulitis with septra last month. Developed wound on the bottom of the foot 2 weeks ago. Saw her PCP who got plain film that was concerning for Osteomyelitis. Scheduled for MRI and referred to wound care, but has not had it yet.   Past Medical History:  Diagnosis Date  . Anxiety   . Arthritis   . Asthma   . CAD (coronary artery disease)   . Cardiac disease   . COPD (chronic obstructive pulmonary disease) ([place])   . Depression   . Diabetes mellitus without complication ([place])   . Hyperlipidemia   . Hypertension   . Hypothyroidism     Patient Active Problem List   Diagnosis Date Noted  . Diabetic foot infection ([place]) 10/09/2016  . Venous ulcer of left leg ([place]) 07/28/2016  . Dry skin dermatitis 07/28/2016  . Type 2 diabetes mellitus with hyperglycemia ([place]) 02/06/2015  . Hip fracture ([place]) 02/05/2015  . Closed right hip fracture ([place]) 02/05/2015  . Abnormal EKG 02/05/2015  . Tobacco abuse 02/05/2015  . DM2 (diabetes mellitus, type 2) ([place]) 02/05/2015  . Essential hypertension 02/05/2015  . Hypothyroidism 02/05/2015  . Coronary artery disease involving native coronary artery of native heart without angina pectoris     Past Surgical History:  Procedure Laterality Date  . APPENDECTOMY    . CARDIAC CATHETERIZATION N/A 02/05/2015   Procedure: Left Heart Cath and Coronary Angiography;   Surgeon: Burnell Blanks, MD;  Location: [facility];  Service: Cardiovascular;  Laterality: N/A;  . EYE SURGERY  2006   unsure if exact procedure  . HIP FRACTURE SURGERY    . INTRAMEDULLARY (IM) NAIL INTERTROCHANTERIC Right 02/06/2015   Procedure: INTRAMEDULLARY (IM) NAIL RIGHT HIP;  Surgeon: Leandrew Koyanagi, MD;  Location: [facility];  Service: Orthopedics;  Laterality: Right;  . JOINT REPLACEMENT     due to hip fracture    OB History    No data available       Home Medications    Prior to Admission medications   Medication Sig Start Date End Date Taking? Authorizing Provider  ACCU-CHEK FASTCLIX LANCETS MISC Check blood sugar 2-3 times daily DX E11.9 09/28/16   Estill Dooms, MD  ACCU-CHEK SMARTVIEW test strip Check blood sugar 2-3 times daily DX E11.9 09/28/16   Estill Dooms, MD  albuterol (PROVENTIL HFA;VENTOLIN HFA) 108 (90 Base) MCG/ACT inhaler Inhale 1 puff into the lungs every 6 (six) hours as needed for wheezing or shortness of breath. Reported on 09/06/2015 09/25/15   Robyn Haber, MD  albuterol (PROVENTIL) (2.5 MG/3ML) 0.083% nebulizer solution 2.61m per 362mevery 6 hours as needed for shortness of breath/wheezing 06/16/16   JeLauree ChandlerNP  aspirin 81 MG tablet Take 81 mg by mouth daily.    Historical Provider, MD  Blood Glucose Monitoring Suppl (ACCU-CHEK NANO SMARTVIEW) w/Device KIT Check blood  sugar 2-3 times daily DX E11.9 09/28/16   Estill Dooms, MD  carvedilol (COREG) 6.25 MG tablet Take 1 tablet (6.25 mg total) by mouth 2 (two) times daily with a meal. 02/04/16   Estill Dooms, MD  insulin NPH Human (HUMULIN N,NOVOLIN N) 100 UNIT/ML injection INJECT 25 UNITS INTO THE SKIN EACH MORNING AND 12 UNITS INTO THE SKIN EACH NIGHT    Historical Provider, MD  levothyroxine (SYNTHROID, LEVOTHROID) 150 MCG tablet Take 150 mcg by mouth daily before breakfast.    Historical Provider, MD  lisinopril (PRINIVIL,ZESTRIL) 10 MG tablet Take 1 tablet (10 mg total) by mouth daily.  05/11/16   Josue Hector, MD  Multiple Vitamin (MULTI VITAMIN PO) Take 1 tablet by mouth daily. Reported on 09/06/2015    Historical Provider, MD  nitroGLYCERIN (NITROSTAT) 0.4 MG SL tablet Place 1 tablet (0.4 mg total) under the tongue every 5 (five) minutes as needed for chest pain. Patient not taking: Reported on 10/08/2016 07/18/15   Josue Hector, MD  Omega-3 Fatty Acids (OMEGA 3 PO) Take 1 capsule by mouth daily. Reported on 09/06/2015    Historical Provider, MD  triamcinolone cream (KENALOG) 0.1 % Apply 1 application topically 2 (two) times daily. Patient not taking: Reported on 10/08/2016 07/28/16   Lauree Chandler, NP    Family History Family History  Problem Relation Age of Onset  . Alcohol abuse Father   . Cancer Mother     lung  . Heart disease Mother   . Diabetes Sister   . Heart disease Sister     Social History Social History  Substance Use Topics  . Smoking status: Current Every Day Smoker    Packs/day: 1.00    Years: 63.00  . Smokeless tobacco: Never Used     Comment: has recently cut back, conts to cut back but repors she has a lot of excuses   . Alcohol use 0.0 oz/week     Comment: 3 times per month     Allergies   Brethine [terbutaline] and Keflex [cephalexin]   Review of Systems Review of Systems  Respiratory: Negative for shortness of breath.   Cardiovascular: Negative for chest pain.  Gastrointestinal: Negative for abdominal pain.  Neurological: Negative for headaches.     Physical Exam Updated Vital Signs BP (!) 173/64 (BP Location: Left Arm)   Pulse 88   Temp 99.8 F (37.7 C) (Oral)   Resp 18   SpO2 97%   Physical Exam  Constitutional: She is oriented to person, place, and time. She appears well-developed and well-nourished. No distress.  HENT:  Head: Normocephalic and atraumatic.  Nose: Nose normal.  Eyes: Conjunctivae and EOM are normal. Pupils are equal, round, and reactive to light. Right eye exhibits no discharge. Left eye  exhibits no discharge. No scleral icterus.  Neck: Normal range of motion. Neck supple.  Cardiovascular: Normal rate and regular rhythm.  Exam reveals no gallop and no friction rub.   No murmur heard. Pulmonary/Chest: Effort normal and breath sounds normal. No stridor. No respiratory distress. She has no rales.  Abdominal: Soft. She exhibits no distension. There is no tenderness.  Musculoskeletal: She exhibits no edema or tenderness.  Neurological: She is alert and oriented to person, place, and time.  Skin: Skin is warm and dry. No rash noted. She is not diaphoretic.  Erythema of left lower extremity from the foot to just below the knee. Deep wound on the plantar aspect of the first MTP with purulent  drainage. see image  Psychiatric: She has a normal mood and affect.  Vitals reviewed.        ED Treatments / Results  Labs (all labs ordered are listed, but only abnormal results are displayed) Labs Reviewed  COMPREHENSIVE METABOLIC PANEL - Abnormal; Notable for the following:       Result Value   Sodium 128 (*)    Potassium 3.1 (*)    Chloride 93 (*)    Glucose, Bld 193 (*)    Calcium 8.3 (*)    Albumin 2.6 (*)    Alkaline Phosphatase 138 (*)    All other components within normal limits  CBC WITH DIFFERENTIAL/PLATELET - Abnormal; Notable for the following:    WBC 15.1 (*)    RBC 3.73 (*)    Hemoglobin 11.8 (*)    HCT 34.2 (*)    Neutro Abs 12.9 (*)    Monocytes Absolute 1.5 (*)    All other components within normal limits  CULTURE, BLOOD (ROUTINE X 2)  CULTURE, BLOOD (ROUTINE X 2)  AEROBIC CULTURE (SUPERFICIAL SPECIMEN)  HEMOGLOBIN A1C  SEDIMENTATION RATE  C-REACTIVE PROTEIN  PREALBUMIN  CBC  BASIC METABOLIC PANEL  TSH  I-STAT CG4 LACTIC ACID, ED  I-STAT CG4 LACTIC ACID, ED    EKG  EKG Interpretation None       Radiology No results found.  Procedures Procedures (including critical care time)  Medications Ordered in ED Medications  nicotine (NICODERM  CQ - dosed in mg/24 hours) patch 21 mg (21 mg Transdermal Patch Applied 10/09/16 2113)  albuterol (PROVENTIL) (2.5 MG/3ML) 0.083% nebulizer solution 2.5 mg (not administered)  aspirin chewable tablet 81 mg (not administered)  carvedilol (COREG) tablet 6.25 mg (not administered)  enoxaparin (LOVENOX) injection 40 mg (not administered)  acetaminophen (TYLENOL) tablet 650 mg (not administered)    Or  acetaminophen (TYLENOL) suppository 650 mg (not administered)  ondansetron (ZOFRAN) tablet 4 mg (not administered)    Or  ondansetron (ZOFRAN) injection 4 mg (not administered)  metroNIDAZOLE (FLAGYL) IVPB 500 mg (not administered)  vancomycin (VANCOCIN) 1,250 mg in sodium chloride 0.9 % 250 mL IVPB (not administered)  vancomycin (VANCOCIN) 500 mg in sodium chloride 0.9 % 100 mL IVPB (not administered)  ceFEPIme (MAXIPIME) 2 g in dextrose 5 % 50 mL IVPB (not administered)  levothyroxine (SYNTHROID, LEVOTHROID) tablet 150 mcg (not administered)  albuterol (PROVENTIL) (2.5 MG/3ML) 0.083% nebulizer solution 2.5 mg (not administered)  diazepam (VALIUM) tablet 2 mg (not administered)  sodium chloride 0.9 % bolus 1,000 mL (1,000 mLs Intravenous New Bag/Given 10/09/16 2114)  diazepam (VALIUM) tablet 2 mg (2 mg Oral Given 10/09/16 2111)  potassium chloride SA (K-DUR,KLOR-CON) CR tablet 40 mEq (40 mEq Oral Given 10/09/16 2116)     Initial Impression / Assessment and Plan / ED Course  I have reviewed the triage vital signs and the nursing notes.  Pertinent labs & imaging results that were available during my care of the patient were reviewed by me and considered in my medical decision making (see chart for details).     Concern for persistent cellulitis of the left lower extremity with osteomyelitis of the left foot. Case discussed with hospitalist who will admit the patient for further workup and management.  Final Clinical Impressions(s) / ED Diagnoses   Final diagnoses:  Foot pain  Penetrating  wound of left foot, initial encounter  Leg erythema        Fatima Blank, MD 10/09/16 2243

## 2016-10-09 NOTE — ED Triage Notes (Signed)
Per family- pt had a wound that started 1 week ago. Pt states that she was seen by her MD and ordered MRI but that has not been done yet. Pt states that her home health RN states that the wound has progressed and that pt had fevers this morning.

## 2016-10-09 NOTE — Progress Notes (Signed)
Pharmacy Antibiotic Note  AZA DANTES is a 81 y.o. female admitted on 10/09/2016 with worsening leg wound. Pt has a history of L leg venous ulcer. Daughter noted that patient's wound had worsening erythema, drainage, foul odor and was moving up the extremity. WBC 15.1, LA wnl, SCr 0.67, afebrile, slightly hypertensive.   Plan: -Vancomycin 1250 mg IV x1 then 500 mg IV q12h -Cefepime 2 g IV q24h -Monitor renal fx, cultures, VT as needed   Temp (24hrs), Avg:99.5 F (37.5 C), Min:98.6 F (37 C), Max:99.8 F (37.7 C)   Recent Labs Lab 10/09/16 1500 10/09/16 1529 10/09/16 2010  WBC 15.1*  --   --   CREATININE 0.67  --   --   LATICACIDVEN  --  1.19 0.83    Estimated Creatinine Clearance: 49.1 mL/min (by C-G formula based on SCr of 0.67 mg/dL).    Allergies  Allergen Reactions  . Brethine [Terbutaline]     Made patient confused  . Keflex [Cephalexin] Nausea Only    Loss of appetite    Antimicrobials this admission: 4/20 vancomycin > 4/20 cefepime >  Dose adjustments this admission: N/A  Microbiology results: 4/20 blood cx:   [place] Friday 10/09/2016 10:20 PM

## 2016-10-09 NOTE — ED Notes (Signed)
X-Ray at bedside.

## 2016-10-09 NOTE — Telephone Encounter (Signed)
Message left on clinical intake voicemail:    Tresa Endo with [facility] called to inform Shanda Bumps that patient with drastic wound changes since Wednesday.  Patient's wound is foul smelling, moving up leg, red, swelling and temporal fever of 100.4 oral 99.6.  I called Tresa Endo and left message informing her patient needs to be seen at Urgent Care or the emergency room.  I called patient and informed her to seek medical attention at Urgent Care or the Emergency Room as well  Message routed to PCP as a FYI and for additional recommendations if any

## 2016-10-09 NOTE — ED Triage Notes (Signed)
Pt noted to have heat, reddness, drainage, odor to left foot. reddness and swelling extends to knee.

## 2016-10-10 ENCOUNTER — Inpatient Hospital Stay (HOSPITAL_COMMUNITY): Payer: Medicare Other

## 2016-10-10 ENCOUNTER — Encounter (HOSPITAL_COMMUNITY): Payer: Self-pay

## 2016-10-10 DIAGNOSIS — D631 Anemia in chronic kidney disease: Secondary | ICD-10-CM | POA: Diagnosis present

## 2016-10-10 DIAGNOSIS — E11628 Type 2 diabetes mellitus with other skin complications: Secondary | ICD-10-CM

## 2016-10-10 DIAGNOSIS — N189 Chronic kidney disease, unspecified: Secondary | ICD-10-CM

## 2016-10-10 DIAGNOSIS — E11621 Type 2 diabetes mellitus with foot ulcer: Secondary | ICD-10-CM | POA: Diagnosis present

## 2016-10-10 DIAGNOSIS — E876 Hypokalemia: Secondary | ICD-10-CM | POA: Diagnosis present

## 2016-10-10 DIAGNOSIS — L97509 Non-pressure chronic ulcer of other part of unspecified foot with unspecified severity: Secondary | ICD-10-CM

## 2016-10-10 DIAGNOSIS — L03119 Cellulitis of unspecified part of limb: Secondary | ICD-10-CM

## 2016-10-10 LAB — SEDIMENTATION RATE: SED RATE: 87 mm/h — AB (ref 0–22)

## 2016-10-10 LAB — BASIC METABOLIC PANEL
ANION GAP: 8 (ref 5–15)
BUN: 13 mg/dL (ref 6–20)
CO2: 26 mmol/L (ref 22–32)
CREATININE: 0.7 mg/dL (ref 0.44–1.00)
Calcium: 8.2 mg/dL — ABNORMAL LOW (ref 8.9–10.3)
Chloride: 95 mmol/L — ABNORMAL LOW (ref 101–111)
GFR calc Af Amer: >60 mL/min (ref >60)
GFR calc non Af Amer: >60 mL/min (ref >60)
Glucose, Bld: 244 mg/dL — ABNORMAL HIGH (ref 65–99)
POTASSIUM: 3.3 mmol/L — AB (ref 3.5–5.1)
SODIUM: 129 mmol/L — AB (ref 135–145)

## 2016-10-10 LAB — GLUCOSE, CAPILLARY
GLUCOSE-CAPILLARY: 298 mg/dL — AB (ref 65–99)
GLUCOSE-CAPILLARY: 329 mg/dL — AB (ref 65–99)
GLUCOSE-CAPILLARY: 283 mg/dL — AB (ref 65–99)
Glucose-Capillary: 256 mg/dL — ABNORMAL HIGH (ref 65–99)
Glucose-Capillary: 198 mg/dL — ABNORMAL HIGH (ref 65–99)

## 2016-10-10 LAB — TSH: TSH: 2.33 u[IU]/mL (ref 0.350–4.500)

## 2016-10-10 LAB — CBC
HCT: 34.2 % — ABNORMAL LOW (ref 36.0–46.0)
Hemoglobin: 11.7 g/dL — ABNORMAL LOW (ref 12.0–15.0)
MCH: 31.5 pg (ref 26.0–34.0)
MCHC: 34.2 g/dL (ref 30.0–36.0)
MCV: 91.9 fL (ref 78.0–100.0)
PLATELETS: 305 10*3/uL (ref 150–400)
RBC: 3.72 MIL/uL — AB (ref 3.87–5.11)
RDW: 12.5 % (ref 11.5–15.5)
WBC: 14.2 10*3/uL — AB (ref 4.0–10.5)

## 2016-10-10 LAB — PREALBUMIN: Prealbumin: <5 mg/dL — ABNORMAL LOW (ref 18–38)

## 2016-10-10 LAB — PROTIME-INR
INR: 1.02
PROTHROMBIN TIME: 13.4 s (ref 11.4–15.2)

## 2016-10-10 LAB — URIC ACID: URIC ACID, SERUM: 3.3 mg/dL (ref 2.3–6.6)

## 2016-10-10 LAB — OSMOLALITY: OSMOLALITY: 287 mosm/kg (ref 275–295)

## 2016-10-10 LAB — C-REACTIVE PROTEIN: CRP: 11.9 mg/dL — ABNORMAL HIGH (ref <1.0)

## 2016-10-10 MED ORDER — POTASSIUM CHLORIDE CRYS ER 20 MEQ PO TBCR
40.0000 meq | EXTENDED_RELEASE_TABLET | Freq: Once | ORAL | Status: AC
  Administered 2016-10-10: 40 meq via ORAL
  Filled 2016-10-10: qty 2

## 2016-10-10 MED ORDER — INSULIN GLARGINE 100 UNIT/ML ~~LOC~~ SOLN
6.0000 [IU] | Freq: Two times a day (BID) | SUBCUTANEOUS | Status: DC
  Administered 2016-10-10 – 2016-10-11 (×3): 6 [IU] via SUBCUTANEOUS
  Filled 2016-10-10 (×3): qty 0.06

## 2016-10-10 MED ORDER — PIPERACILLIN-TAZOBACTAM 3.375 G IVPB
3.3750 g | Freq: Three times a day (TID) | INTRAVENOUS | Status: DC
  Administered 2016-10-10 – 2016-10-12 (×6): 3.375 g via INTRAVENOUS
  Filled 2016-10-10 (×7): qty 50

## 2016-10-10 MED ORDER — GADOBENATE DIMEGLUMINE 529 MG/ML IV SOLN
12.0000 mL | Freq: Once | INTRAVENOUS | Status: AC | PRN
  Administered 2016-10-10: 12 mL via INTRAVENOUS

## 2016-10-10 MED ORDER — SODIUM CHLORIDE 0.9 % IV SOLN
INTRAVENOUS | Status: DC
  Administered 2016-10-10: 13:00:00 via INTRAVENOUS

## 2016-10-10 NOTE — Progress Notes (Addendum)
Pharmacy Antibiotic Note  Tara Flores is a 81 y.o. female admitted on 10/09/2016 with worsening leg wound. Patient has a history of lef leg venous ulcer. Daughter noted that patient's wound had worsening erythema, drainage, foul odor and was moving up the extremity.  MRI showing possible septic arthritis per documentation.  Pharmacy consulted to change cefepime and Flagyl to Zosyn while continuing vancomycin.  She is afebrile and her WBC is elevated at 14.2.   Plan: - Continue vanc  IV Q12H - Zosyn 3.375gm IV Q8H, 4 hr infusion - Monitor renal fxn, micro data, vanc trough as indicated    Temp (24hrs), Avg:99.4 F (37.4 C), Min:98.6 F (37 C), Max:99.8 F (37.7 C)   Recent Labs Lab 10/09/16 1500 10/09/16 1529 10/09/16 2010 10/10/16 0031  WBC 15.1*  --   --  14.2*  CREATININE 0.67  --   --  0.70  LATICACIDVEN  --  1.19 0.83  --     Estimated Creatinine Clearance: 49.1 mL/min (by C-G formula based on SCr of 0.7 mg/dL).    Allergies  Allergen Reactions  . Brethine [Terbutaline]     Made patient confused  . Keflex [Cephalexin] Nausea Only    Loss of appetite   Vanc 4/20 >> Cefepime 4/20 >> 4/21 Flagyl 4/20 >> 4/21 Zosyn 4/21 >>  4/20 BCx x2 - 4/21 foot wound fx - GVR, GPC on Gram stain   Tara Flores D. Laney Potash, PharmD, BCPS Pager:  [phone] 10/10/2016, 12:29 PM

## 2016-10-10 NOTE — Progress Notes (Signed)
Inpatient Diabetes Program Recommendations  AACE/ADA: New Consensus Statement on Inpatient Glycemic Control (2015)  Target Ranges:  Prepandial:   less than 140 mg/dL      Peak postprandial:   less than 180 mg/dL (1-2 hours)      Critically ill patients:  140 - 180 mg/dL  Results for KIAHNA, BANGHART (MRN [id number]) as of 10/10/2016 09:07  Ref. Range 10/09/2016 23:54 10/10/2016 08:01  Glucose-Capillary Latest Ref Range: 65 - 99 mg/dL 409 (H) 811 (H)  Results for RACHE, KLIMASZEWSKI (MRN [id number]) as of 10/10/2016 09:07  Ref. Range 10/09/2016 15:00 10/10/2016 00:31  Glucose Latest Ref Range: 65 - 99 mg/dL 213 (H) 086 (H)    Review of Glycemic Control  Diabetes history: DM2 Outpatient Diabetes medications: NPH 25 units in the morning and NPH 12 units at night Current orders for Inpatient glycemic control: Novolog 0-15 units TID with meals, Novolog 0-5 units QHS  Inpatient Diabetes Program Recommendations: Insulin - Basal: If glucose continues to be consistently greater than 180 mg/dl, MD may want to consider ordering low dose basal insulin. HgbA1C: A1C in process.   NOTE: Noted consult for Diabetes Coordinator per Foot Ulcer Protocol. Chart reviewed. Will continue to follow along while inpatient.  Thanks, Orlando Penner, RN, MSN, CDE Diabetes Coordinator Inpatient Diabetes Program [phone] (Team Pager from 8am to 5pm)

## 2016-10-10 NOTE — Progress Notes (Signed)
Family at bedside and asking for update from MD.  MD notified. A.Martise Waddell, RN

## 2016-10-10 NOTE — Progress Notes (Addendum)
PROGRESS NOTE                                                                                                                                                                                                             Patient Demographics:    Tara Flores, is a 81 y.o. female, DOB - 09-18-1935, ZOX:[id number]  Admit date - 10/09/2016   Admitting Physician Clydie Braun, MD  Outpatient Primary MD for the patient is Sharon Seller, NP  LOS - 1  Chief Complaint  Patient presents with  . Foot Pain       Brief Narrative    Tara Flores is a 81 y.o. female with medical history significant of HTN, DM type II, hypothyroidism, CAD, and tobacco abuse; who presents with complaints of worsening right foot wound, Looks like the wound has been present for at least 2 weeks, she also has severe peripheral neuropathy frequently steps on things without realizing. For the last few days her leg has been getting hot and warm and there has been some foul-smelling discharge from the wound. Came to the ER workup showed left foot cellulitis with possible underlying osteomyelitis..    Subjective:    Tara Flores today has, No headache, No chest pain, No abdominal pain - No Nausea, No new weakness tingling or numbness, No Cough - SOB.    Assessment  & Plan :     1.Left foot cellulitis with possible underlying cellulitis and septic arthritis on MRI. Discussed the MRI report with radiologist. Have requested orthopedic to evaluate the patient. For now continue empiric IV vancomycin and Zosyn. Follow cultures. Does not appear to have systemic sepsis. Hemodynamically stable. Discussed with daughter. Patient will be a high-risk candidate for any adverse cardiopulmonary outcome of surgery. Patient and daughter accept the risk and would like to proceed for surgical procedure if needed.  2. CAD. Under medical treatment. Ongoing smoking one to 2  packs a day. Poorly controlled diabetes mellitus. She is chest pain now symptom-free, on aspirin, Coreg along with ACE inhibitor for secondary prevention. Continue. We will get baseline EKG.  3. HTN on Coreg & ACE. As needed IV Lopressor ordered.  4. Hypothyroidism. Synthroid.  5. DM Type II. Add Lantus, continue sliding scale and monitor CBGs.  Lab Results  Component Value Date   HGBA1C 8.9 (H)  10/17/2015   CBG (last 3)   Recent Labs  10/09/16 2354 10/10/16 0801  GLUCAP 256* 198*     Diet : Diet Carb Modified Fluid consistency: Thin; Room service appropriate? Yes    Family Communication  :     Code Status :  DNR  Disposition Plan  :  Likely SNF  Consults  :  Ortho, Podiatry  Procedures  :    MRI L foot - possible osteo and septic arthritis  DVT Prophylaxis  :  Lovenox   Lab Results  Component Value Date   PLT 305 10/10/2016    Inpatient Medications  Scheduled Meds: . aspirin  81 mg Oral Daily  . carvedilol  6.25 mg Oral BID WC  . enoxaparin (LOVENOX) injection  40 mg Subcutaneous Daily  . insulin aspart  0-15 Units Subcutaneous TID WC  . insulin aspart  0-5 Units Subcutaneous QHS  . levothyroxine  150 mcg Oral QAC breakfast  . lisinopril  10 mg Oral Daily  . nicotine  21 mg Transdermal Once  . potassium chloride  40 mEq Oral Once   Continuous Infusions: . ceFEPime (MAXIPIME) IV Stopped (10/10/16 0236)  . vancomycin     PRN Meds:.acetaminophen **OR** acetaminophen, albuterol, diazepam, ondansetron **OR** ondansetron (ZOFRAN) IV  Antibiotics  :    Anti-infectives    Start     Dose/Rate Route Frequency Ordered Stop   10/10/16 1100  vancomycin (VANCOCIN) 500 mg in sodium chloride 0.9 % 100 mL IVPB     500 mg 100 mL/hr over 60 Minutes Intravenous Every 12 hours 10/09/16 2224     10/09/16 2300  metroNIDAZOLE (FLAGYL) IVPB 500 mg  Status:  Discontinued     500 mg 100 mL/hr over 60 Minutes Intravenous Every 8 hours 10/09/16 2218 10/10/16 1205    10/09/16 2300  vancomycin (VANCOCIN) 1,250 mg in sodium chloride 0.9 % 250 mL IVPB     1,250 mg 166.7 mL/hr over 90 Minutes Intravenous  Once 10/09/16 2224 10/10/16 0159   10/09/16 2300  ceFEPIme (MAXIPIME) 2 g in dextrose 5 % 50 mL IVPB  Status:  Discontinued     2 g 100 mL/hr over 30 Minutes Intravenous Every 12 hours 10/09/16 2224 10/09/16 2225   10/09/16 2300  ceFEPIme (MAXIPIME) 2 g in dextrose 5 % 50 mL IVPB     2 g 100 mL/hr over 30 Minutes Intravenous Every 24 hours 10/09/16 2225           Objective:   Vitals:   10/09/16 2253 10/09/16 2315 10/10/16 0005 10/10/16 0500  BP: (!) 157/95 (!) 160/76 (!) 157/66 (!) 128/99  Pulse: 92 84 83 82  Resp:   18 18  Temp:   99.1 F (37.3 C) 99.2 F (37.3 C)  TempSrc:   Oral Oral  SpO2: 100% 95% 97% 97%  Weight:   64.7 kg (142 lb 10.2 oz)   Height:    (1.549 m)     Wt Readings from Last 3 Encounters:  10/10/16 64.7 kg (142 lb 10.2 oz)  10/05/16 64.8 kg (142 lb 12.8 oz)  10/01/16 64.4 kg (142 lb)     Intake/Output Summary (Last 24 hours) at 10/10/16 1212 Last data filed at 10/10/16 0752  Gross per 24 hour  Intake             1640 ml  Output                0 ml  Net  1640 ml     Physical Exam  Awake Alert, No new F.N deficits, Normal affect Orin.AT,PERRAL Supple Neck,No JVD, No cervical lymphadenopathy appriciated.  Symmetrical Chest wall movement, Good air movement bilaterally, CTAB RRR,No Gallops,Rubs or new Murmurs, No Parasternal Heave +ve B.Sounds, Abd Soft, No tenderness, No organomegaly appriciated, No rebound - guarding or rigidity. No Cyanosis, Clubbing or edema, No new Rash or bruise L foot under bandage      Data Review:    CBC  Recent Labs Lab 10/09/16 1500 10/10/16 0031  WBC 15.1* 14.2*  HGB 11.8* 11.7*  HCT 34.2* 34.2*  PLT 295 305  MCV 91.7 91.9  MCH 31.6 31.5  MCHC 34.5 34.2  RDW 12.4 12.5  LYMPHSABS 0.7  --   MONOABS 1.5*  --   EOSABS 0.1  --   BASOSABS 0.0  --      Chemistries   Recent Labs Lab 10/09/16 1500 10/10/16 0031  NA 128* 129*  K 3.1* 3.3*  CL 93* 95*  CO2 25 26  GLUCOSE 193* 244*  BUN 17 13  CREATININE 0.67 0.70  CALCIUM 8.3* 8.2*  AST 28  --   ALT 16  --   ALKPHOS 138*  --   BILITOT 0.5  --    ------------------------------------------------------------------------------------------------------------------ No results for input(s): CHOL, HDL, LDLCALC, TRIG, CHOLHDL, LDLDIRECT in the last 72 hours.  Lab Results  Component Value Date   HGBA1C 8.9 (H) 10/17/2015   ------------------------------------------------------------------------------------------------------------------  Recent Labs  10/10/16 0031  TSH 2.330   ------------------------------------------------------------------------------------------------------------------ No results for input(s): VITAMINB12, FOLATE, FERRITIN, TIBC, IRON, RETICCTPCT in the last 72 hours.  Coagulation profile  Recent Labs Lab 10/10/16 0811  INR 1.02    No results for input(s): DDIMER in the last 72 hours.  Cardiac Enzymes No results for input(s): CKMB, TROPONINI, MYOGLOBIN in the last 168 hours.  Invalid input(s): CK ------------------------------------------------------------------------------------------------------------------ No results found for: BNP  Micro Results Recent Results (from the past 240 hour(s))  Aerobic Culture (superficial specimen)     Status: None (Preliminary result)   Collection Time: 10/10/16  3:28 AM  Result Value Ref Range Status   Specimen Description WOUND  Final   Special Requests FOOT  Final   Gram Stain   Final    NO WBC SEEN MODERATE GRAM POSITIVE COCCI IN PAIRS FEW GRAM VARIABLE ROD    Culture PENDING  Incomplete   Report Status PENDING  Incomplete    Radiology Reports Dg Chest Port 1 View  Result Date: 10/09/2016 CLINICAL DATA:  Initial evaluation for acute shortness of breath. History of asthma. EXAM: PORTABLE CHEST 1  VIEW COMPARISON:  Prior radiograph from 09/06/2015. FINDINGS: Mild cardiomegaly, stable. Mediastinal silhouette within normal limits. Lungs mildly hyperexpanded with diffuse bronchitic changes, suggesting possible underlying COPD. Chronic scarring at the left lung base is similar to previous. No consolidative opacity to suggest pneumonia. No pulmonary edema or pleural effusion. No pneumothorax. No acute osseus abnormality. IMPRESSION: 1. COPD with left basilar scarring, similar to previous. 2. No other active cardiopulmonary disease. 3. Stable cardiomegaly without pulmonary edema. Electronically Signed   By: Rise Mu M.D.   On: 10/09/2016 22:56   Dg Foot Complete Left  Result Date: 10/05/2016 CLINICAL DATA:  Plantar nonhealing ulcer. EXAM: LEFT FOOT - COMPLETE 3+ VIEW COMPARISON:  No recent prior. FINDINGS: Soft tissue ulceration noted along the distal plantar aspect of the left foot. No radiopaque foreign bodies. Peripheral vascular calcification. Diffuse osteopenia. Diffuse severe degenerative changes. Old fractures noted of the  distal aspect of the metatarsals small focal lucency noted of the distal aspect of the left second metatarsal. A small focus of osteomyelitis cannot be completely excluded. IMPRESSION: 1. Soft tissue ulceration noted on the distal plantar aspect of the left foot. No radiopaque foreign bodies. Peripheral vascular calcification consistent peripheral vascular disease. 2. Small focal lucency along the distal aspect of the left second metatarsal noted. A small focus of osteomyelitis cannot be excluded. 3. Diffuse osteopenia, degenerative change. Old fractures of the distal metatarsals. Electronically Signed   By: Maisie Fus  Register   On: 10/05/2016 16:42    Time Spent in minutes  30   Susa Raring M.D on 10/10/2016 at 12:12 PM  Between 7am to 7pm - Pager - [phone] ( page via [facility], text pages only, please mention full 10 digit call back number). After 7pm go to  [email or web address] - password [facility]

## 2016-10-11 ENCOUNTER — Inpatient Hospital Stay (HOSPITAL_COMMUNITY): Payer: Medicare Other

## 2016-10-11 ENCOUNTER — Encounter (HOSPITAL_COMMUNITY): Payer: Self-pay | Admitting: *Deleted

## 2016-10-11 DIAGNOSIS — M86679 Other chronic osteomyelitis, unspecified ankle and foot: Secondary | ICD-10-CM

## 2016-10-11 DIAGNOSIS — D631 Anemia in chronic kidney disease: Secondary | ICD-10-CM

## 2016-10-11 DIAGNOSIS — N189 Chronic kidney disease, unspecified: Secondary | ICD-10-CM

## 2016-10-11 LAB — BASIC METABOLIC PANEL
Anion gap: 9 (ref 5–15)
BUN: 13 mg/dL (ref 6–20)
CHLORIDE: 96 mmol/L — AB (ref 101–111)
CO2: 22 mmol/L (ref 22–32)
Calcium: 8.2 mg/dL — ABNORMAL LOW (ref 8.9–10.3)
Creatinine, Ser: 0.59 mg/dL (ref 0.44–1.00)
GFR calc Af Amer: >60 mL/min (ref >60)
GFR calc non Af Amer: >60 mL/min (ref >60)
Glucose, Bld: 311 mg/dL — ABNORMAL HIGH (ref 65–99)
POTASSIUM: 3.7 mmol/L (ref 3.5–5.1)
SODIUM: 127 mmol/L — AB (ref 135–145)

## 2016-10-11 LAB — GLUCOSE, CAPILLARY
GLUCOSE-CAPILLARY: 246 mg/dL — AB (ref 65–99)
GLUCOSE-CAPILLARY: 320 mg/dL — AB (ref 65–99)
GLUCOSE-CAPILLARY: 70 mg/dL (ref 65–99)
Glucose-Capillary: 273 mg/dL — ABNORMAL HIGH (ref 65–99)
Glucose-Capillary: 81 mg/dL (ref 65–99)

## 2016-10-11 LAB — CBC
HCT: 32.4 % — ABNORMAL LOW (ref 36.0–46.0)
Hemoglobin: 10.8 g/dL — ABNORMAL LOW (ref 12.0–15.0)
MCH: 30.7 pg (ref 26.0–34.0)
MCHC: 33.3 g/dL (ref 30.0–36.0)
MCV: 92 fL (ref 78.0–100.0)
Platelets: 306 10*3/uL (ref 150–400)
RBC: 3.52 MIL/uL — AB (ref 3.87–5.11)
RDW: 12.7 % (ref 11.5–15.5)
WBC: 12.8 10*3/uL — AB (ref 4.0–10.5)

## 2016-10-11 LAB — CREATININE, URINE, RANDOM: Creatinine, Urine: 29.04 mg/dL

## 2016-10-11 LAB — OSMOLALITY, URINE: OSMOLALITY UR: 331 mosm/kg (ref 300–900)

## 2016-10-11 LAB — SODIUM, URINE, RANDOM: Sodium, Ur: 52 mmol/L

## 2016-10-11 LAB — HEMOGLOBIN A1C
Hgb A1c MFr Bld: 8.4 % — ABNORMAL HIGH (ref 4.8–5.6)
Mean Plasma Glucose: 194 mg/dL

## 2016-10-11 MED ORDER — LORAZEPAM 2 MG/ML IJ SOLN
0.5000 mg | INTRAMUSCULAR | Status: AC | PRN
  Administered 2016-10-11 – 2016-10-13 (×3): 0.5 mg via INTRAVENOUS
  Filled 2016-10-11 (×4): qty 1

## 2016-10-11 MED ORDER — FUROSEMIDE 10 MG/ML IJ SOLN
60.0000 mg | Freq: Once | INTRAMUSCULAR | Status: AC
  Administered 2016-10-11: 60 mg via INTRAVENOUS
  Filled 2016-10-11: qty 6

## 2016-10-11 MED ORDER — POTASSIUM CHLORIDE CRYS ER 20 MEQ PO TBCR
40.0000 meq | EXTENDED_RELEASE_TABLET | Freq: Once | ORAL | Status: AC
  Administered 2016-10-11: 40 meq via ORAL
  Filled 2016-10-11: qty 2

## 2016-10-11 MED ORDER — INSULIN GLARGINE 100 UNIT/ML ~~LOC~~ SOLN
10.0000 [IU] | Freq: Two times a day (BID) | SUBCUTANEOUS | Status: DC
  Filled 2016-10-11 (×2): qty 0.1

## 2016-10-11 MED ORDER — NICOTINE 21 MG/24HR TD PT24
21.0000 mg | MEDICATED_PATCH | Freq: Every day | TRANSDERMAL | Status: DC
  Administered 2016-10-11 – 2016-10-14 (×4): 21 mg via TRANSDERMAL
  Filled 2016-10-11 (×4): qty 1

## 2016-10-11 MED ORDER — INSULIN GLARGINE 100 UNIT/ML ~~LOC~~ SOLN
4.0000 [IU] | Freq: Once | SUBCUTANEOUS | Status: AC
  Administered 2016-10-11: 4 [IU] via SUBCUTANEOUS
  Filled 2016-10-11: qty 0.04

## 2016-10-11 MED ORDER — LORAZEPAM 0.5 MG PO TABS
0.5000 mg | ORAL_TABLET | Freq: Once | ORAL | Status: AC
  Administered 2016-10-11: 0.5 mg via ORAL
  Filled 2016-10-11: qty 1

## 2016-10-11 NOTE — Consult Note (Signed)
PODIATRY CONSULTATION  REASON FOR CONSULT: Ulcer/osteomyelitis left foot secondary to DM type II.   Tara Flores GNF:[id number] DOB: [birth year] DOA: 10/09/2016  Referring MD/NP/PA: Dr. Eudelia Bunch PCP: Sharon Seller, NP   Chief Complaint: Foot wound Chief Complaint  Patient presents with  . Foot Pain   BRIEF HISTORY: 81 year old female with a medical history of hypertension, type 2 diabetes, hypothyroidism, CAD, and tobacco use presents regarding a left foot ulceration. Patient denies any significant trauma or pain. She believes that the ulcer has been present for approximately 2 weeks now. Upon admission to the hospital the patient has been placed on IV antibiotics including Zosyn and vancomycin and podiatry was consulted for evaluation of the ulceration and possible surgical intervention.  Patient presents this morning with her daughter  General: The patient is alert and oriented x3 in no acute distress.  Dermatology: Large ulceration noted to the sub-first MPJ of the left foot approximately 3 cm in diameter. Wound base is 100% fibrotic. There is a strong malodor noted. Periwound is significantly macerated. There is cellulitic and edematous changes diffusely throughout the left foot more focused in the forefoot.  Vascular: Erythema noted to the left lower extremity more specific to the left forefoot and digits. Pedal pulses do not appear to be palpable either due to peripheral vascular disease or edematous changes.  Neurological: Epicritic and protective threshold absent bilaterally.   Muskuloskeletal: Mild equinus deformity noted to the left lower extremity contributory to the plantar forefoot ulceration.  MRI IMPRESSION: Findings consistent with osteomyelitis throughout the proximal phalanx of the great toe, medial 1 cm of the head and neck of the first metatarsal and in the medial and lateral sesamoids of the first MTP joint.  Edema and enhancement in the distal 3 cm of the  second, third and fourth metatarsals with fractures of the metatarsal necks identified. Signal change could be due to the fractures and altered mechanics but given the extent of cellulitis about the foot, signal change is worrisome for osteomyelitis in the distal second, third and fourth metatarsals.  No soft tissue abscess is identified. Gas in the soft tissues subjacent to the proximal first, second and third toes worrisome for tissue necrosis.  Small first MTP joint effusion could be septic or aseptic.  Assessment: #1 ulcer sub-first MPJ left foot secondary to diabetes mellitus 2. Gas gangrene with tissue necrosis first intermetatarsal space left foot 3. Osteomyelitis left foot. Please see MRI impression.  4. PVD left lower extremity  Plan of Care:  1. The patient was evaluated this a.m. with the daughter present 2. Based on clinical exam and MRI findings the patient will require surgical intervention. Recommend transmetatarsal amputation with incision and drainage and tendo Achilles lengthening left lower extremity. Surgery will be tentatively scheduled for Monday, 10/11/2016, at 5:30 PM 3. Continue IV antibiotics. Patient will likely require 6 weeks IV antibiotics postsurgically.  4. Recommend vascular consult. ABI and arterial Doppler ordered today. 5. The patient will follow up in the office post discharge.  Thank you for the consult. I'll continue to follow while inpatient. Any questions please contact me directly [phone].    Felecia Shelling, DPM [facility]  Dr. Felecia Shelling, DPM    [address]  [place], [place] [address]                Office [phone]  Fax [phone]

## 2016-10-11 NOTE — Progress Notes (Signed)
Inpatient Diabetes Program Recommendations  AACE/ADA: New Consensus Statement on Inpatient Glycemic Control (2015)  Target Ranges:  Prepandial:   less than 140 mg/dL      Peak postprandial:   less than 180 mg/dL (1-2 hours)      Critically ill patients:  140 - 180 mg/dL   Results for Tara Flores, Tara Flores (MRN [id number]) as of 10/11/2016 09:50  Ref. Range 10/10/2016 08:01 10/10/2016 12:09 10/10/2016 16:54 10/10/2016 21:33 10/11/2016 06:13  Glucose-Capillary Latest Ref Range: 65 - 99 mg/dL 782 (H) 956 (H) 213 (H) 283 (H) 273 (H)   Review of Glycemic Control  Diabetes history: DM2 Outpatient Diabetes medications: NPH 25 units in the morning and NPH 12 units at night Current orders for Inpatient glycemic control: Lantus 6 units BID, Novolog 0-15 units TID with meals, Novolog 0-5 units QHS   Inpatient Diabetes Program Recommendations: Insulin - Basal: Please consider increasing Lantus to 10 units BID. Insulin - Meal Coverage: Please consider ordering Novolog 3 units TID with meals for meal coveage if patient eats at least 50% of meal. HgbA1C: A1C in process.  Thanks, Orlando Penner, RN, MSN, CDE Diabetes Coordinator Inpatient Diabetes Program [phone] (Team Pager from 8am to 5pm)

## 2016-10-11 NOTE — Progress Notes (Signed)
Around 2225 last night 10/10/16, patient was found sitting on the floor in her room by staff.  Patient claimed she was reaching for her bag and decided to sit on the floor.  This was an un-witnessed fall.  Patient sustained no visible injuries.  She denied any pain.  She was anxious but this is a part of her history.  On-call was contacted, orders for Ativan and Tele safety monitoring in place.  Patient has been trying to move around with out calling for assistance.  Patient's daughter Tara Flores was contacted at [phone].  Daughter was very appreciative of being informed and to know her mother is doing fine.

## 2016-10-11 NOTE — Progress Notes (Signed)
PROGRESS NOTE                                                                                                                                                                                                             Patient Demographics:    Tara Flores, is a 81 y.o. female, DOB - 03/14/1936, ZOX:[id number]  Admit date - 10/09/2016   Admitting Physician Clydie Braun, MD  Outpatient Primary MD for the patient is Sharon Seller, NP  LOS - 2  Chief Complaint  Patient presents with  . Foot Pain       Brief Narrative    Tara Flores is a 81 y.o. female with medical history significant of HTN, DM type II, hypothyroidism, CAD, and tobacco abuse; who presents with complaints of worsening right foot wound, Looks like the wound has been present for at least 2 weeks, she also has severe peripheral neuropathy frequently steps on things without realizing. For the last few days her leg has been getting hot and warm and there has been some foul-smelling discharge from the wound. Came to the ER workup showed left foot cellulitis with possible underlying osteomyelitis..    Subjective:    Tara Flores today has, No headache, No chest pain, No abdominal pain - No Nausea, No new weakness tingling or numbness, No Cough - SOB. Unreliable historian.   Assessment  & Plan :     1. Left foot cellulitis with possible underlying cellulitis and septic arthritis on MRI. Discussed the MRI report with radiologist. For now continue empiric IV vancomycin and Zosyn. Follow cultures. Does not appear to have systemic sepsis. Hemodynamically stable. Discussed with daughter. Patient will be a high-risk candidate for any adverse cardiopulmonary outcome of surgery. Patient and daughter accept the risk and would like to proceed for surgical procedure if needed.Note had called Dr. Aundria Rud orthopedic surgeon on call on 10/10/2016 he requested me to consult  her right wrist Dr. Logan Bores who has been called as well, likely to be seen today for wound care thereafter will defer to podiatry and orthopedics if patient requires any surgery in the future.  2. CAD. Under medical treatment. Ongoing smoking one to 2 packs a day. Poorly controlled diabetes mellitus. She is chest pain now symptom-free, on aspirin, Coreg along with ACE inhibitor for secondary prevention. Continue. We will  get baseline EKG.  3. HTN on Coreg & ACE. As needed IV Lopressor ordered.  4. Hypothyroidism. Synthroid.  5. Hyponatremia. Suspicious for SIADH. Stop fluids, Lasix 1, check urine osmolality sodium and serum osmolality. Monitor BMP.  6.  DM Type II. Added Lantus and will increase the dose for better control, continue sliding scale and monitor CBGs.  Lab Results  Component Value Date   HGBA1C 8.9 (H) 10/17/2015   CBG (last 3)   Recent Labs  10/10/16 1654 10/10/16 2133 10/11/16 0613  GLUCAP 329* 283* 273*     Diet : Diet Carb Modified Fluid consistency: Thin; Room service appropriate? Yes    Family Communication  :     Code Status :  DNR  Disposition Plan  :  Likely SNF  Consults  :  Ortho, Podiatry  Procedures  :    MRI L foot - possible osteo and septic arthritis  DVT Prophylaxis  :  Lovenox   Lab Results  Component Value Date   PLT 306 10/11/2016    Inpatient Medications  Scheduled Meds: . aspirin  81 mg Oral Daily  . carvedilol  6.25 mg Oral BID WC  . enoxaparin (LOVENOX) injection  40 mg Subcutaneous Daily  . furosemide  60 mg Intravenous Once  . insulin aspart  0-15 Units Subcutaneous TID WC  . insulin aspart  0-5 Units Subcutaneous QHS  . insulin glargine  6 Units Subcutaneous BID  . levothyroxine  150 mcg Oral QAC breakfast  . lisinopril  10 mg Oral Daily  . potassium chloride  40 mEq Oral Once   Continuous Infusions: . piperacillin-tazobactam (ZOSYN)  IV 3.375 g (10/11/16 0521)  . vancomycin Stopped (10/11/16 0031)   PRN  Meds:.acetaminophen **OR** acetaminophen, albuterol, LORazepam, ondansetron **OR** ondansetron (ZOFRAN) IV  Antibiotics  :    Anti-infectives    Start     Dose/Rate Route Frequency Ordered Stop   10/10/16 1300  piperacillin-tazobactam (ZOSYN) IVPB 3.375 g     3.375 g 12.5 mL/hr over 240 Minutes Intravenous Every 8 hours 10/10/16 1230     10/10/16 1100  vancomycin (VANCOCIN) 500 mg in sodium chloride 0.9 % 100 mL IVPB     500 mg 100 mL/hr over 60 Minutes Intravenous Every 12 hours 10/09/16 2224     10/09/16 2300  metroNIDAZOLE (FLAGYL) IVPB 500 mg  Status:  Discontinued     500 mg 100 mL/hr over 60 Minutes Intravenous Every 8 hours 10/09/16 2218 10/10/16 1205   10/09/16 2300  vancomycin (VANCOCIN) 1,250 mg in sodium chloride 0.9 % 250 mL IVPB     1,250 mg 166.7 mL/hr over 90 Minutes Intravenous  Once 10/09/16 2224 10/10/16 0159   10/09/16 2300  ceFEPIme (MAXIPIME) 2 g in dextrose 5 % 50 mL IVPB  Status:  Discontinued     2 g 100 mL/hr over 30 Minutes Intravenous Every 12 hours 10/09/16 2224 10/09/16 2225   10/09/16 2300  ceFEPIme (MAXIPIME) 2 g in dextrose 5 % 50 mL IVPB  Status:  Discontinued     2 g 100 mL/hr over 30 Minutes Intravenous Every 24 hours 10/09/16 2225 10/10/16 1230         Objective:   Vitals:   10/10/16 2021 10/10/16 2227 10/11/16 0434 10/11/16 0500  BP:  (!) 182/96 (!) 143/81   Pulse:  95 73   Resp:   18   Temp:  (!) 100.9 F (38.3 C)  98.4 F (36.9 C)  TempSrc:  Oral  Oral  SpO2: 99% 95% 97%   Weight:      Height:        Wt Readings from Last 3 Encounters:  10/10/16 64.7 kg (142 lb 10.2 oz)  10/05/16 64.8 kg (142 lb 12.8 oz)  10/01/16 64.4 kg (142 lb)     Intake/Output Summary (Last 24 hours) at 10/11/16 1035 Last data filed at 10/10/16 1800  Gross per 24 hour  Intake              956 ml  Output                0 ml  Net              956 ml     Physical Exam  Pleasantly confused, No new F.N deficits, Normal affect [place].AT,PERRAL Supple  Neck,No JVD, No cervical lymphadenopathy appriciated.  Symmetrical Chest wall movement, Good air movement bilaterally, CTAB RRR,No Gallops,Rubs or new Murmurs, No Parasternal Heave +ve B.Sounds, Abd Soft, No tenderness, No organomegaly appriciated, No rebound - guarding or rigidity. No Cyanosis, Clubbing or edema, No new Rash or bruise L foot under bandage      Data Review:    CBC  Recent Labs Lab 10/09/16 1500 10/10/16 0031 10/11/16 0812  WBC 15.1* 14.2* 12.8*  HGB 11.8* 11.7* 10.8*  HCT 34.2* 34.2* 32.4*  PLT 295 305 306  MCV 91.7 91.9 92.0  MCH 31.6 31.5 30.7  MCHC 34.5 34.2 33.3  RDW 12.4 12.5 12.7  LYMPHSABS 0.7  --   --   MONOABS 1.5*  --   --   EOSABS 0.1  --   --   BASOSABS 0.0  --   --     Chemistries   Recent Labs Lab 10/09/16 1500 10/10/16 0031 10/11/16 0812  NA 128* 129* 127*  K 3.1* 3.3* 3.7  CL 93* 95* 96*  CO2 GLUCOSE 193* 244* 311*  BUN CREATININE 0.67 0.70 0.59  CALCIUM 8.3* 8.2* 8.2*  AST 28  --   --   ALT 16  --   --   ALKPHOS 138*  --   --   BILITOT 0.5  --   --    ------------------------------------------------------------------------------------------------------------------ No results for input(s): CHOL, HDL, LDLCALC, TRIG, CHOLHDL, LDLDIRECT in the last 72 hours.  Lab Results  Component Value Date   HGBA1C 8.9 (H) 10/17/2015   ------------------------------------------------------------------------------------------------------------------  Recent Labs  10/10/16 0031  TSH 2.330   ------------------------------------------------------------------------------------------------------------------ No results for input(s): VITAMINB12, FOLATE, FERRITIN, TIBC, IRON, RETICCTPCT in the last 72 hours.  Coagulation profile  Recent Labs Lab 10/10/16 0811  INR 1.02    No results for input(s): DDIMER in the last 72 hours.  Cardiac Enzymes No results for input(s): CKMB, TROPONINI, MYOGLOBIN in the last 168  hours.  Invalid input(s): CK ------------------------------------------------------------------------------------------------------------------ No results found for: BNP  Micro Results Recent Results (from the past 240 hour(s))  Aerobic Culture (superficial specimen)     Status: None (Preliminary result)   Collection Time: 10/10/16  3:28 AM  Result Value Ref Range Status   Specimen Description WOUND  Final   Special Requests FOOT  Final   Gram Stain   Final    NO WBC SEEN MODERATE GRAM POSITIVE COCCI IN PAIRS FEW GRAM VARIABLE ROD    Culture CULTURE REINCUBATED FOR BETTER GROWTH  Final   Report Status PENDING  Incomplete    Radiology Reports Mr Foot Left W Wo Contrast  Result  Date: 10/10/2016 CLINICAL DATA:  Large nonhealing ulcer on the plantar surface of the foot at the level of the first MTP joint. Diabetic patient. The wound was first noticed 2 weeks ago. Question osteomyelitis. EXAM: MRI OF THE LEFT FOREFOOT WITHOUT AND WITH CONTRAST TECHNIQUE: Multiplanar, multisequence MR imaging of the left foot was performed both before and after administration of intravenous contrast. CONTRAST:  12 ml MULTIHANCE GADOBENATE DIMEGLUMINE 529 MG/ML IV SOLN COMPARISON:  Plain films left foot 10/05/2016. FINDINGS: Bones/Joint/Cartilage There is marrow edema and enhancement throughout the proximal phalanx of the great toe and in the medial 1 cm of the head and neck of the first metatarsal consistent with osteomyelitis. Marrow signal in the distal phalanx of the great toe is normal. Intense edema and enhancement are also seen in both the medial and lateral sesamoids of the first MTP joint consistent osteomyelitis. Edema and enhancement are seen in the heads of the second, third and fourth metatarsals extending 3 cm proximally into the diaphysis. The patient has fractures of the necks of the second, third and fourth metatarsals which are age indeterminate. Ligaments Intact. Muscles and Tendons There is  some fatty atrophy of the intrinsic musculature of the foot. There is edema and enhancement within intrinsic musculature the foot but no intramuscular fluid collection is identified. Soft tissues A large ulceration is seen on the plantar surface of the foot at the level the first MTP joint. No soft tissue abscess is identified. There is gas in the soft tissues in the first intermetatarsal space and tracking subjacent to the its proximal first and second toes. Small first MTP joint effusion is noted. IMPRESSION: Findings consistent with osteomyelitis throughout the proximal phalanx of the great toe, medial 1 cm of the head and neck of the first metatarsal and in the medial and lateral sesamoids of the first MTP joint. Edema and enhancement in the distal 3 cm of the second, third and fourth metatarsals with fractures of the metatarsal necks identified. Signal change could be due to the fractures and altered mechanics but given the extent of cellulitis about the foot, signal change is worrisome for osteomyelitis in the distal second, third and fourth metatarsals. No soft tissue abscess is identified. Gas in the soft tissues subjacent to the proximal first, second and third toes worrisome for tissue necrosis. Small first MTP joint effusion could be septic or aseptic. Electronically Signed   By: Drusilla Kanner M.D.   On: 10/10/2016 08:18   Dg Chest Port 1 View  Result Date: 10/09/2016 CLINICAL DATA:  Initial evaluation for acute shortness of breath. History of asthma. EXAM: PORTABLE CHEST 1 VIEW COMPARISON:  Prior radiograph from 09/06/2015. FINDINGS: Mild cardiomegaly, stable. Mediastinal silhouette within normal limits. Lungs mildly hyperexpanded with diffuse bronchitic changes, suggesting possible underlying COPD. Chronic scarring at the left lung base is similar to previous. No consolidative opacity to suggest pneumonia. No pulmonary edema or pleural effusion. No pneumothorax. No acute osseus abnormality.  IMPRESSION: 1. COPD with left basilar scarring, similar to previous. 2. No other active cardiopulmonary disease. 3. Stable cardiomegaly without pulmonary edema. Electronically Signed   By: Rise Mu M.D.   On: 10/09/2016 22:56   Dg Foot Complete Left  Result Date: 10/05/2016 CLINICAL DATA:  Plantar nonhealing ulcer. EXAM: LEFT FOOT - COMPLETE 3+ VIEW COMPARISON:  No recent prior. FINDINGS: Soft tissue ulceration noted along the distal plantar aspect of the left foot. No radiopaque foreign bodies. Peripheral vascular calcification. Diffuse osteopenia. Diffuse severe degenerative changes. Old fractures  noted of the distal aspect of the metatarsals small focal lucency noted of the distal aspect of the left second metatarsal. A small focus of osteomyelitis cannot be completely excluded. IMPRESSION: 1. Soft tissue ulceration noted on the distal plantar aspect of the left foot. No radiopaque foreign bodies. Peripheral vascular calcification consistent peripheral vascular disease. 2. Small focal lucency along the distal aspect of the left second metatarsal noted. A small focus of osteomyelitis cannot be excluded. 3. Diffuse osteopenia, degenerative change. Old fractures of the distal metatarsals. Electronically Signed   By: Maisie Fus  Register   On: 10/05/2016 16:42    Time Spent in minutes  30   Susa Raring M.D on 10/11/2016 at 10:35 AM  Between 7am to 7pm - Pager - [phone] ( page via [facility], text pages only, please mention full 10 digit call back number). After 7pm go to [email or web address] - password [facility]

## 2016-10-11 NOTE — Progress Notes (Signed)
Paged Trey Paula, Respiratory Therapist for prn albuterol treatment per patient request.

## 2016-10-12 ENCOUNTER — Inpatient Hospital Stay (HOSPITAL_COMMUNITY): Payer: Medicare Other | Admitting: Certified Registered Nurse Anesthetist

## 2016-10-12 ENCOUNTER — Encounter: Payer: Self-pay | Admitting: Pharmacist

## 2016-10-12 ENCOUNTER — Inpatient Hospital Stay (HOSPITAL_COMMUNITY): Payer: Medicare Other

## 2016-10-12 ENCOUNTER — Encounter (HOSPITAL_COMMUNITY)
Admission: EM | Disposition: A | Discharge status: Skilled Nursing Facility | Payer: Self-pay | Source: Home / Self Care | Attending: Internal Medicine

## 2016-10-12 ENCOUNTER — Other Ambulatory Visit: Payer: Self-pay

## 2016-10-12 ENCOUNTER — Encounter (HOSPITAL_COMMUNITY): Payer: Medicare Other

## 2016-10-12 ENCOUNTER — Encounter (HOSPITAL_COMMUNITY): Payer: Self-pay | Admitting: Certified Registered Nurse Anesthetist

## 2016-10-12 DIAGNOSIS — M86672 Other chronic osteomyelitis, left ankle and foot: Secondary | ICD-10-CM

## 2016-10-12 DIAGNOSIS — E08621 Diabetes mellitus due to underlying condition with foot ulcer: Secondary | ICD-10-CM

## 2016-10-12 DIAGNOSIS — L97401 Non-pressure chronic ulcer of unspecified heel and midfoot limited to breakdown of skin: Secondary | ICD-10-CM

## 2016-10-12 DIAGNOSIS — Z01810 Encounter for preprocedural cardiovascular examination: Secondary | ICD-10-CM

## 2016-10-12 DIAGNOSIS — I70209 Unspecified atherosclerosis of native arteries of extremities, unspecified extremity: Secondary | ICD-10-CM

## 2016-10-12 HISTORY — PX: AMPUTATION: SHX166

## 2016-10-12 LAB — HEMOGLOBIN A1C
Hgb A1c MFr Bld: 8.5 % — ABNORMAL HIGH (ref 4.8–5.6)
MEAN PLASMA GLUCOSE: 197 mg/dL

## 2016-10-12 LAB — GLUCOSE, CAPILLARY
GLUCOSE-CAPILLARY: 217 mg/dL — AB (ref 65–99)
GLUCOSE-CAPILLARY: 216 mg/dL — AB (ref 65–99)
GLUCOSE-CAPILLARY: 240 mg/dL — AB (ref 65–99)
Glucose-Capillary: 199 mg/dL — ABNORMAL HIGH (ref 65–99)
Glucose-Capillary: 200 mg/dL — ABNORMAL HIGH (ref 65–99)
Glucose-Capillary: 183 mg/dL — ABNORMAL HIGH (ref 65–99)

## 2016-10-12 LAB — AEROBIC CULTURE W GRAM STAIN (SUPERFICIAL SPECIMEN): Gram Stain: NONE SEEN

## 2016-10-12 LAB — BASIC METABOLIC PANEL
Anion gap: 8 (ref 5–15)
BUN: 14 mg/dL (ref 6–20)
CALCIUM: 8.5 mg/dL — AB (ref 8.9–10.3)
CO2: 29 mmol/L (ref 22–32)
CREATININE: 0.62 mg/dL (ref 0.44–1.00)
Chloride: 97 mmol/L — ABNORMAL LOW (ref 101–111)
GFR calc non Af Amer: >60 mL/min (ref >60)
GLUCOSE: 198 mg/dL — AB (ref 65–99)
Potassium: 3.9 mmol/L (ref 3.5–5.1)
Sodium: 134 mmol/L — ABNORMAL LOW (ref 135–145)

## 2016-10-12 LAB — CBC
HCT: 32.9 % — ABNORMAL LOW (ref 36.0–46.0)
Hemoglobin: 10.8 g/dL — ABNORMAL LOW (ref 12.0–15.0)
MCH: 30.4 pg (ref 26.0–34.0)
MCHC: 32.8 g/dL (ref 30.0–36.0)
MCV: 92.7 fL (ref 78.0–100.0)
PLATELETS: 334 10*3/uL (ref 150–400)
RBC: 3.55 MIL/uL — ABNORMAL LOW (ref 3.87–5.11)
RDW: 12.6 % (ref 11.5–15.5)
WBC: 12.3 10*3/uL — ABNORMAL HIGH (ref 4.0–10.5)

## 2016-10-12 SURGERY — AMPUTATION, FOOT, RAY
Anesthesia: General | Site: Foot | Laterality: Left

## 2016-10-12 MED ORDER — ONDANSETRON HCL 4 MG/2ML IJ SOLN: 4.0000 mg | Freq: Once | INTRAMUSCULAR | Status: DC | PRN

## 2016-10-12 MED ORDER — LIDOCAINE HCL 1 % IJ SOLN
INTRAMUSCULAR | Status: AC
  Filled 2016-10-12: qty 20

## 2016-10-12 MED ORDER — ONDANSETRON HCL 4 MG/2ML IJ SOLN
INTRAMUSCULAR | Status: AC
  Filled 2016-10-12: qty 2

## 2016-10-12 MED ORDER — INSULIN GLARGINE 100 UNIT/ML ~~LOC~~ SOLN
12.0000 [IU] | Freq: Two times a day (BID) | SUBCUTANEOUS | Status: DC
  Filled 2016-10-12: qty 0.12

## 2016-10-12 MED ORDER — SODIUM CHLORIDE 0.9 % IV SOLN
INTRAVENOUS | Status: DC | PRN
  Administered 2016-10-12: 18:00:00 via INTRAVENOUS

## 2016-10-12 MED ORDER — ALBUTEROL SULFATE (2.5 MG/3ML) 0.083% IN NEBU
INHALATION_SOLUTION | RESPIRATORY_TRACT | Status: AC
Start: 2016-10-12 — End: 2016-10-12
  Filled 2016-10-12: qty 3

## 2016-10-12 MED ORDER — PHENYLEPHRINE 40 MCG/ML (10ML) SYRINGE FOR IV PUSH (FOR BLOOD PRESSURE SUPPORT)
PREFILLED_SYRINGE | INTRAVENOUS | Status: AC
  Filled 2016-10-12: qty 10

## 2016-10-12 MED ORDER — FUROSEMIDE 10 MG/ML IJ SOLN
20.0000 mg | Freq: Once | INTRAMUSCULAR | Status: AC
  Administered 2016-10-12: 20 mg via INTRAVENOUS
  Filled 2016-10-12: qty 2

## 2016-10-12 MED ORDER — 0.9 % SODIUM CHLORIDE (POUR BTL) OPTIME
TOPICAL | Status: DC | PRN
  Administered 2016-10-12: 1000 mL

## 2016-10-12 MED ORDER — INSULIN ASPART 100 UNIT/ML ~~LOC~~ SOLN
SUBCUTANEOUS | Status: AC
  Filled 2016-10-12: qty 1

## 2016-10-12 MED ORDER — BUPIVACAINE HCL (PF) 0.5 % IJ SOLN
INTRAMUSCULAR | Status: DC | PRN
  Administered 2016-10-12: 5 mL

## 2016-10-12 MED ORDER — FENTANYL CITRATE (PF) 100 MCG/2ML IJ SOLN
INTRAMUSCULAR | Status: DC | PRN
  Administered 2016-10-12: 50 ug via INTRAVENOUS
  Administered 2016-10-12: 25 ug via INTRAVENOUS

## 2016-10-12 MED ORDER — SODIUM CHLORIDE 0.9% FLUSH: 3.0000 mL | INTRAVENOUS | Status: DC | PRN

## 2016-10-12 MED ORDER — CEFAZOLIN SODIUM-DEXTROSE 2-4 GM/100ML-% IV SOLN
2.0000 g | Freq: Three times a day (TID) | INTRAVENOUS | Status: DC
  Administered 2016-10-12 – 2016-10-14 (×6): 2 g via INTRAVENOUS
  Filled 2016-10-12 (×8): qty 100

## 2016-10-12 MED ORDER — SODIUM CHLORIDE 0.9% FLUSH
3.0000 mL | Freq: Two times a day (BID) | INTRAVENOUS | Status: DC
  Administered 2016-10-12: 3 mL via INTRAVENOUS

## 2016-10-12 MED ORDER — LACTATED RINGERS IV SOLN: INTRAVENOUS | Status: DC

## 2016-10-12 MED ORDER — FENTANYL CITRATE (PF) 250 MCG/5ML IJ SOLN
INTRAMUSCULAR | Status: AC
  Filled 2016-10-12: qty 5

## 2016-10-12 MED ORDER — INSULIN GLARGINE 100 UNIT/ML ~~LOC~~ SOLN
12.0000 [IU] | Freq: Two times a day (BID) | SUBCUTANEOUS | Status: DC
Start: 2016-10-12 — End: 2016-10-14
  Administered 2016-10-13 – 2016-10-14 (×3): 12 [IU] via SUBCUTANEOUS
  Filled 2016-10-12 (×4): qty 0.12

## 2016-10-12 MED ORDER — BUPIVACAINE HCL (PF) 0.5 % IJ SOLN
INTRAMUSCULAR | Status: AC
  Filled 2016-10-12: qty 30

## 2016-10-12 MED ORDER — FENTANYL CITRATE (PF) 100 MCG/2ML IJ SOLN: 25.0000 ug | INTRAMUSCULAR | Status: DC | PRN

## 2016-10-12 MED ORDER — LIDOCAINE HCL (PF) 1 % IJ SOLN
INTRAMUSCULAR | Status: DC | PRN
  Administered 2016-10-12: 5 mL

## 2016-10-12 MED ORDER — PROPOFOL 10 MG/ML IV BOLUS
INTRAVENOUS | Status: AC
  Filled 2016-10-12: qty 20

## 2016-10-12 MED ORDER — PROPOFOL 500 MG/50ML IV EMUL
INTRAVENOUS | Status: DC | PRN
  Administered 2016-10-12: 50 ug/kg/min via INTRAVENOUS

## 2016-10-12 MED ORDER — MIDAZOLAM HCL 2 MG/2ML IJ SOLN
INTRAMUSCULAR | Status: AC
  Filled 2016-10-12: qty 2

## 2016-10-12 MED ORDER — SODIUM CHLORIDE 0.9% FLUSH
10.0000 mL | INTRAVENOUS | Status: DC | PRN
Start: 2016-10-12 — End: 2016-10-13

## 2016-10-12 MED ORDER — PROMETHAZINE HCL 12.5 MG PO TABS
12.5000 mg | ORAL_TABLET | ORAL | Status: DC | PRN
  Filled 2016-10-12: qty 1

## 2016-10-12 MED ORDER — SODIUM CHLORIDE 0.9 % IV SOLN: 250.0000 mL | INTRAVENOUS | Status: DC | PRN

## 2016-10-12 MED ORDER — OXYCODONE-ACETAMINOPHEN 5-325 MG PO TABS
1.0000 | ORAL_TABLET | ORAL | Status: DC | PRN
  Administered 2016-10-13 (×2): 2 via ORAL
  Filled 2016-10-12 (×2): qty 2

## 2016-10-12 SURGICAL SUPPLY — 50 items
BANDAGE ACE 4X5 VEL STRL LF (GAUZE/BANDAGES/DRESSINGS) ×2 IMPLANT
BLADE AVERAGE 25MMX9MM (BLADE) ×1
BLADE AVERAGE 25X9 (BLADE) ×2 IMPLANT
BLADE OSCILLATING /SAGITTAL (BLADE) ×2 IMPLANT
BLADE SURG 15 STRL LF DISP TIS (BLADE) IMPLANT
BLADE SURG 15 STRL SS (BLADE)
BNDG GAUZE ELAST 4 BULKY (GAUZE/BANDAGES/DRESSINGS) ×3 IMPLANT
CHLORAPREP W/TINT 26ML (MISCELLANEOUS) IMPLANT
CLOSURE STERI-STRIP 1/2X4 (GAUZE/BANDAGES/DRESSINGS) ×1
CLSR STERI-STRIP ANTIMIC 1/2X4 (GAUZE/BANDAGES/DRESSINGS) ×1 IMPLANT
COVER SURGICAL LIGHT HANDLE (MISCELLANEOUS) ×3 IMPLANT
CUFF TOURNIQUET SINGLE 18IN (TOURNIQUET CUFF) ×5 IMPLANT
CUFF TOURNIQUET SINGLE 24IN (TOURNIQUET CUFF) IMPLANT
DRSG PAD ABDOMINAL 8X10 ST (GAUZE/BANDAGES/DRESSINGS) ×3 IMPLANT
ELECT REM PT RETURN 9FT ADLT (ELECTROSURGICAL)
ELECTRODE REM PT RTRN 9FT ADLT (ELECTROSURGICAL) IMPLANT
GAUZE IODOFORM PACK 1/2 7832 (GAUZE/BANDAGES/DRESSINGS) ×2 IMPLANT
GAUZE SPONGE 4X4 12PLY STRL (GAUZE/BANDAGES/DRESSINGS) ×3 IMPLANT
GAUZE SPONGE 4X4 12PLY STRL LF (GAUZE/BANDAGES/DRESSINGS) ×2 IMPLANT
GAUZE XEROFORM 1X8 LF (GAUZE/BANDAGES/DRESSINGS) ×3 IMPLANT
GAUZE XEROFORM 5X9 LF (GAUZE/BANDAGES/DRESSINGS) ×2 IMPLANT
GLOVE BIO SURGEON STRL SZ8 (GLOVE) ×3 IMPLANT
GLOVE BIOGEL PI IND STRL 8 (GLOVE) ×1 IMPLANT
GLOVE BIOGEL PI INDICATOR 8 (GLOVE) ×6
GLOVE ECLIPSE 7.5 STRL STRAW (GLOVE) ×2 IMPLANT
GOWN STRL REUS W/ TWL LRG LVL3 (GOWN DISPOSABLE) ×2 IMPLANT
GOWN STRL REUS W/TWL LRG LVL3 (GOWN DISPOSABLE) ×6
IRRIG SUCT STRYKERFLOW 2 WTIP (MISCELLANEOUS) ×3
IRRIGATION SUCT STRKRFLW 2 WTP (MISCELLANEOUS) IMPLANT
KIT BASIN OR (CUSTOM PROCEDURE TRAY) ×3 IMPLANT
KIT ROOM TURNOVER OR (KITS) ×3 IMPLANT
NEEDLE 27GAX1X1/2 (NEEDLE) ×3 IMPLANT
NS IRRIG 1000ML POUR BTL (IV SOLUTION) ×3 IMPLANT
PACK ORTHO EXTREMITY (CUSTOM PROCEDURE TRAY) ×3 IMPLANT
PAD ABD 8X10 STRL (GAUZE/BANDAGES/DRESSINGS) ×2 IMPLANT
PAD ARMBOARD 7.5X6 YLW CONV (MISCELLANEOUS) ×6 IMPLANT
PAD CAST 4YDX4 CTTN HI CHSV (CAST SUPPLIES) ×1 IMPLANT
PADDING CAST COTTON 4X4 STRL (CAST SUPPLIES) ×3
SCRUB BETADINE 4OZ XXX (MISCELLANEOUS) ×3 IMPLANT
SOL PREP POV-IOD 4OZ 10% (MISCELLANEOUS) IMPLANT
STAPLER SKIN 35 WIDE (STAPLE) ×2 IMPLANT
SUT PROLENE 4 0 PS 2 18 (SUTURE) ×3 IMPLANT
SUT VIC AB 3-0 PS2 18 (SUTURE) ×3 IMPLANT
SUT VICRYL 4-0 PS2 18IN ABS (SUTURE) ×3 IMPLANT
SYR CONTROL 10ML LL (SYRINGE) ×5 IMPLANT
TOWEL OR 17X24 6PK STRL BLUE (TOWEL DISPOSABLE) ×3 IMPLANT
TOWEL OR 17X26 10 PK STRL BLUE (TOWEL DISPOSABLE) ×3 IMPLANT
TUBE CONNECTING 12'X1/4 (SUCTIONS) ×1
TUBE CONNECTING 12X1/4 (SUCTIONS) ×2 IMPLANT
YANKAUER SUCT BULB TIP NO VENT (SUCTIONS) IMPLANT

## 2016-10-12 NOTE — Consult Note (Signed)
   [facility] CM Inpatient Consult   10/12/2016  Tara Flores 21-Mar-1936 [id number]    Made aware of hospitalization by [facility] RNCM. Tara Flores is active with [facility] Care Management program. She has been followed by [facility] RNCM and [facility] LCSW. Please see chart review then notes for further patient outreach details.   Spoke with Tara Flores and daughters at bedside. Primary contact is her daughter Tara Flores [phone]. They confirm that the discharge plan will be rehab upon hospital discharge. Tara Flores is also visually impaired. Patient and daughter endorse that Tara Flores has poor living conditions and that she can not return there. States they have been working on getting her place condemned. Tara Flores states " I hope you guys are still going to be able to help me find somewhere else to live". Tara Flores states they have been looking into other housing options that are closer to her. Tara Flores (daughter) lives in [place].   Tara Flores states she is having surgery today. Made her aware that writer will continue to follow and will relay this information to [facility] team. Support and encouragement given. Provided contact information. Made inpatient RNCM aware of above.    [name], MSN-Ed, RN,BSN [facility] Liaison [phone]

## 2016-10-12 NOTE — Anesthesia Procedure Notes (Signed)
Anesthesia Regional Block: Popliteal block   Pre-Anesthetic Checklist: ,, timeout performed, Correct Patient, Correct Site, Correct Laterality, Correct Procedure, Correct Position, site marked, Risks and benefits discussed,  Surgical consent,  Pre-op evaluation,  At surgeon's request and post-op pain management  Laterality: Left  Prep: chloraprep       Needles:   Needle Type: Echogenic Stimulator Needle          Additional Needles:   Procedures: ultrasound guided,,,,,,,,  Narrative:  Start time: 10/12/2016 6:35 PM End time: 10/12/2016 6:40 PM Injection made incrementally with aspirations every 5 mL.  Performed by: Personally   Additional Notes: 25 cc 0.5% Naropin injected easily

## 2016-10-12 NOTE — Op Note (Signed)
10/09/2016 - 10/12/2016  8:56 PM  PATIENT:  Tara Flores  81 y.o. female  PRE-OPERATIVE DIAGNOSIS:  OSTEOMYELITIS  POST-OPERATIVE DIAGNOSIS:  OSTEOMYELITIS  PROCEDURE:  Procedure(s): TRANSMETATARSAL AMPUTATION LEFT FOOT WITH ACHILLES TENDON LENGTHENING (Left)  INCISION AND DRAINAGE LEFT FOOT  SURGEON:  Surgeon(s) and Role:    * Felecia Shelling, DPM - Primary  PHYSICIAN ASSISTANT: None  ASSISTANTS: none   ANESTHESIA:   regional  EBL:  Total I/O In: 100 [I.V.:100] Out: 100 [Blood:100]  BLOOD ADMINISTERED:none  DRAINS: none   LOCAL MEDICATIONS USED:  MARCAINE    and LIDOCAINE   SPECIMEN:  Source of Specimen:  Deep wound cultures  DISPOSITION OF SPECIMEN:  PATHOLOGY  COUNTS:  YES  TOURNIQUET:   Total Tourniquet Time Documented: Calf (Left) - 37 minutes Total: Calf (Left) - 37 minutes  JUSTIFICATION FOR PROCEDURE: The patient is a 81 year old female with a history of uncontrolled diabetes mellitus who presents to the [facility] OR for surgical correction of an acute gangrene to the left foot. Patient was admitted to [facility] on 10/09/2016 and placed on IV antibiotics at which time podiatry was eventually consulted. Patient was evaluated and the decision was made for a midfoot amputation of the left foot as well as Achilles tendon lengthening and incision and drainage left.  All possible complications and details of procedure were explained. All patient questions were answered. No guarantees were expressed or implied. Both the patient and the surgeon boson the patient consent form with the witness present and placed in the patient's chart.  PROCEDURE IN DETAIL: The patient was brought to the operating room placed the operating table in a supine position at which time an aseptic scrub and drape were performed about the patient's left lower extremity after anesthesia was induced as described above. The left calf tourniquet which was used for hemostasis was  inflated. At this time attention was directed to the posterior aspect of the patient's left ankle where procedure #1 commenced.  Procedure #1: Tendo Achilles lengthening left 3 small stab incisions were planned and made about the posterior aspect of the patient's Achilles tendon with 2 medial incisions 1 lateral incision. The incision began at the bisection of the Achilles tendon and extended medial and lateral. The incisions were placed approximately 1 cm apart. Increased dorsiflexion of the ankle joint was also noted. At this time the incision sites were primarily closed using 4-0 Prolene suture.  Procedure #2: Tarsal-metatarsal Lisfranc amputation left A fishmouth type of incision was planned and made about the metatarsals left foot. The incisions were carried down to the level of bone with care taken to cut clamped ligated or retracted way all small neurovascular structures traversing the incision site. Sharp dissection was utilized to expose the underlying metatarsals. The intraoperative decision was made to perform a Lisfranc type amputation instead of a transmetatarsal amputation due to the extensive osteomyelitis. The forefoot was disarticulated at the tarsal metatarsal joint and the entire forefoot was removed and placed in a sterile specimen container. Sharp tissue dissection was achieved using a #10 surgical blade.  Procedure #3: Incision and drainage left foot After surgical amputation at the level of the tarsal metatarsal joint left foot soft tissue dissection was utilized and blunt dissection was performed to ensure that there were no underlying deep tissue abscesses. All deep tissue abscesses and infection was copiously irrigated using 3 L of normal saline with pulse lavage.  Primary closure was obtained using wine stain still skin staples to reapproximate  superficial skin edges. 1/2 inch iodoform packing was utilized as well as half-inch Steri-Strips to reinforce the amputation  site.  Dry sterile compressive dressings were then applied to all previously mentioned incision sites about the patient's left lower extremity. The left calf tourniquet which was used for hemostasis was deflated. All normal neurovascular responses including pink color and warmth returned to the patient's amputation stump the left lower extremity.  The patient was transferred from the operating room to the recovery room having tolerated the procedure and anesthesia well. All vital signs are stable. After preceding in the recovery room the patient was readmitted to her room with prescriptions for adequate analgesic care for in hospital use. Orders for a cam boot were placed and physical therapy for evaluation and gait training. The patient is also to keep the dressings clean dry and intact until she is to follow-up with surgeon Dr. Gala Lewandowsky in the office upon discharge.  IMPRESSION: From a surgical standpoint the patient will likely require 6 weeks IV antibiotic therapy.   PLAN OF CARE: Patient has current admission orders  PATIENT DISPOSITION:  PACU - hemodynamically stable.   Delay start of Pharmacological VTE agent (>24hrs) due to surgical blood loss or risk of bleeding: not applicable  Felecia Shelling, DPM [facility]  Dr. Felecia Shelling, DPM    [address]                                             [place], [place] [address]                Office [phone]  Fax [phone]

## 2016-10-12 NOTE — Anesthesia Postprocedure Evaluation (Addendum)
Anesthesia Post Note  Patient: Tara Flores  Procedure(s) Performed: Procedure(s) (LRB): TRANSMETATARSAL AMPUTATION LEFT FOOT WITH ACHILLES TENDON LENGTHENING (Left)  Patient location during evaluation: PACU Anesthesia Type: MAC Level of consciousness: awake, awake and alert and oriented Pain management: pain level controlled Vital Signs Assessment: post-procedure vital signs reviewed and stable Respiratory status: spontaneous breathing, nonlabored ventilation and respiratory function stable Cardiovascular status: blood pressure returned to baseline Anesthetic complications: no       Last Vitals:  Vitals:   10/12/16 2005 10/12/16 2020  BP: (!) 152/71 (!) 150/98  Pulse: 66 70  Resp: 17 (!) 22  Temp:  36.4 C    Last Pain:  Vitals:   10/12/16 2055  TempSrc:   PainSc: 3                  Temperence Zenor COKER

## 2016-10-12 NOTE — Progress Notes (Signed)
Peripherally Inserted Central Catheter/Midline Placement  The IV Nurse has discussed with the patient and/or persons authorized to consent for the patient, the purpose of this procedure and the potential benefits and risks involved with this procedure.  The benefits include less needle sticks, lab draws from the catheter, and the patient may be discharged home with the catheter. Risks include, but not limited to, infection, bleeding, blood clot (thrombus formation), and puncture of an artery; nerve damage and irregular heartbeat and possibility to perform a PICC exchange if needed/ordered by physician.  Alternatives to this procedure were also discussed.  Bard Power PICC patient education guide, fact sheet on infection prevention and patient information card has been provided to patient /or left at bedside.    PICC/Midline Placement Documentation        Lisabeth Devoid 10/12/2016, 12:08 PM Consent obtained from daughter Prestina Raigoza at bedside

## 2016-10-12 NOTE — Progress Notes (Signed)
VASCULAR LAB PRELIMINARY  ARTERIAL  ABI completed:    RIGHT    LEFT    PRESSURE WAVEFORM  PRESSURE WAVEFORM  BRACHIAL PICC line Triphasic BRACHIAL 182 Triphasic  DP 144 Moniophasic        AT 55 Dampened monophasic  PT 142 Monophasic PT 97 Monophasic sounded biphasic    RIGHT LEFT  ABI 0.79 0.53   ABIs indicate a moderate reduction in arterial flow with abnormal Doppler waveforms on the right. Left ABIs indicate a moderate to severe reduction in arterial flow with abnormal Doppler waveforms.  Tahjai Schetter, RVS 10/12/2016, 4:36 PM

## 2016-10-12 NOTE — Anesthesia Preprocedure Evaluation (Signed)
Anesthesia Evaluation  Patient identified by MRN, date of birth, ID band Patient awake    Reviewed: Allergy & Precautions, NPO status , Patient's Chart, lab work & pertinent test results  Airway Mallampati: II  TM Distance: >3 FB     Dental  (+) Edentulous Upper, Edentulous Lower   Pulmonary Current Smoker,     + decreased breath sounds+ wheezing      Cardiovascular hypertension,  Rhythm:Regular Rate:Normal     Neuro/Psych    GI/Hepatic   Endo/Other  diabetes  Renal/GU      Musculoskeletal   Abdominal   Peds  Hematology   Anesthesia Other Findings   Reproductive/Obstetrics                             Anesthesia Physical Anesthesia Plan  ASA: III  Anesthesia Plan: MAC and Regional   Post-op Pain Management:    Induction: Intravenous  Airway Management Planned: Natural Airway and Simple Face Mask  Additional Equipment:   Intra-op Plan:   Post-operative Plan:   Informed Consent: I have reviewed the patients History and Physical, chart, labs and discussed the procedure including the risks, benefits and alternatives for the proposed anesthesia with the patient or authorized representative who has indicated his/her understanding and acceptance.     Plan Discussed with: CRNA and Anesthesiologist  Anesthesia Plan Comments: (Patient with osteomyelitis L. Foot COPD with wheezing and mild labored respirations Longstanding Type 2 DM glucose 240 will give  5u Novolog  Plan MAc with popliteal saphenous blocks)        Anesthesia Quick Evaluation

## 2016-10-12 NOTE — Progress Notes (Signed)
PROGRESS NOTE                                                                                                                                                                                                             Patient Demographics:    Tara Flores, is a 81 y.o. female, DOB - 07-25-1935, ZOX:[id number]  Admit date - 10/09/2016   Admitting Physician Clydie Braun, MD  Outpatient Primary MD for the patient is Tara Seller, NP  LOS - 3  Chief Complaint  Patient presents with  . Foot Pain       Brief Narrative    Tara Flores is a 81 y.o. female with medical history significant of HTN, DM type II, hypothyroidism, CAD, and tobacco abuse; who presents with complaints of worsening right foot wound, Looks like the wound has been present for at least 2 weeks, she also has severe peripheral neuropathy frequently steps on things without realizing. For the last few days her leg has been getting hot and warm and there has been some foul-smelling discharge from the wound. Came to the ER workup showed left foot cellulitis with possible underlying osteomyelitis..    Subjective:    Rashia Mckesson today has, No headache, No chest pain, No abdominal pain - No Nausea, No new weakness tingling or numbness, No Cough - SOB. Unreliable historian.   Assessment  & Plan :     1. Left foot cellulitis with possible underlying cellulitis and septic arthritis on MRI. Podiatry on board and in templating left transmetatarsal amputation on 10/12/2016, patient moderate to high-risk for adverse cardio pulmonary outcome, daughter and patient agree with the risk and wanted to proceed with the procedure. Per podiatry patient will require 6 weeks of IV antibiotics via PICC line, discussed with ID physician Dr. Ninetta Lights on 10/12/2016. 6 weeks of IV Ancef will be arranged.  2. CAD. Under medical treatment. Ongoing smoking one to 2 packs a day.  Poorly controlled diabetes mellitus. She is chest pain now symptom-free, on aspirin, Coreg along with ACE inhibitor for secondary prevention. Stable baseline EKG.  3. HTN on Coreg & ACE. As needed IV Lopressor ordered.  4. Hypothyroidism. Synthroid.  5. Hyponatremia. SIADH, much better after Lasix continue to monitor.  6.  DM Type II. Placed on Lantus and sliding scale. Lantus dose adjusted further  Lab Results  Component Value Date   HGBA1C 8.4 (H) 10/10/2016   CBG (last 3)   Recent Labs  10/11/16 2208 10/12/16 0650 10/12/16 0757  GLUCAP 81 217* 199*     Diet : Diet NPO time specified    Family Communication  :     Code Status :  DNR  Disposition Plan  :  Likely SNF  Consults  :  Ortho, Podiatry  Procedures  :    MRI L foot - possible osteo and septic arthritis  DVT Prophylaxis  :  Lovenox   Lab Results  Component Value Date   PLT 334 10/12/2016    Inpatient Medications  Scheduled Meds: . aspirin  81 mg Oral Daily  . carvedilol  6.25 mg Oral BID WC  . enoxaparin (LOVENOX) injection  40 mg Subcutaneous Daily  . insulin aspart  0-15 Units Subcutaneous TID WC  . insulin aspart  0-5 Units Subcutaneous QHS  . insulin glargine  10 Units Subcutaneous BID  . levothyroxine  150 mcg Oral QAC breakfast  . lisinopril  10 mg Oral Daily  . nicotine  21 mg Transdermal Daily   Continuous Infusions: . piperacillin-tazobactam (ZOSYN)  IV 3.375 g (10/12/16 0351)  . vancomycin Stopped (10/12/16 0341)   PRN Meds:.acetaminophen **OR** acetaminophen, albuterol, LORazepam, ondansetron **OR** ondansetron (ZOFRAN) IV  Antibiotics  :    Anti-infectives    Start     Dose/Rate Route Frequency Ordered Stop   10/10/16 1300  piperacillin-tazobactam (ZOSYN) IVPB 3.375 g     3.375 g 12.5 mL/hr over 240 Minutes Intravenous Every 8 hours 10/10/16 1230     10/10/16 1100  vancomycin (VANCOCIN) 500 mg in sodium chloride 0.9 % 100 mL IVPB     500 mg 100 mL/hr over 60 Minutes  Intravenous Every 12 hours 10/09/16 2224     10/09/16 2300  metroNIDAZOLE (FLAGYL) IVPB 500 mg  Status:  Discontinued     500 mg 100 mL/hr over 60 Minutes Intravenous Every 8 hours 10/09/16 2218 10/10/16 1205   10/09/16 2300  vancomycin (VANCOCIN) 1,250 mg in sodium chloride 0.9 % 250 mL IVPB     1,250 mg 166.7 mL/hr over 90 Minutes Intravenous  Once 10/09/16 2224 10/10/16 0159   10/09/16 2300  ceFEPIme (MAXIPIME) 2 g in dextrose 5 % 50 mL IVPB  Status:  Discontinued     2 g 100 mL/hr over 30 Minutes Intravenous Every 12 hours 10/09/16 2224 10/09/16 2225   10/09/16 2300  ceFEPIme (MAXIPIME) 2 g in dextrose 5 % 50 mL IVPB  Status:  Discontinued     2 g 100 mL/hr over 30 Minutes Intravenous Every 24 hours 10/09/16 2225 10/10/16 1230         Objective:   Vitals:   10/11/16 1036 10/11/16 1413 10/11/16 2110 10/12/16 0405  BP: (!) 140/59 (!) 141/65    Pulse: 66 60    Resp:  16    Temp:  97.5 F (36.4 C)    TempSrc:      SpO2:  93% 93% 97%  Weight:      Height:        Wt Readings from Last 3 Encounters:  10/10/16 64.7 kg (142 lb 10.2 oz)  10/05/16 64.8 kg (142 lb 12.8 oz)  10/01/16 64.4 kg (142 lb)     Intake/Output Summary (Last 24 hours) at 10/12/16 0940 Last data filed at 10/11/16 2309  Gross per 24 hour  Intake  150 ml  Output              300 ml  Net             -150 ml     Physical Exam  Remains confused but in no distress, No new F.N deficits, very pleasant otherwise [place].AT,PERRAL Supple Neck,No JVD, No cervical lymphadenopathy appriciated.  Symmetrical Chest wall movement, Good air movement bilaterally, CTAB RRR,No Gallops,Rubs or new Murmurs, No Parasternal Heave +ve B.Sounds, Abd Soft, No tenderness, No organomegaly appriciated, No rebound - guarding or rigidity. No Cyanosis, Clubbing or edema, No new Rash or bruise Left leg under bandage with minimal surrounding cellulitis    Data Review:    CBC  Recent Labs Lab 10/09/16 1500  10/10/16 0031 10/11/16 0812 10/12/16 0311  WBC 15.1* 14.2* 12.8* 12.3*  HGB 11.8* 11.7* 10.8* 10.8*  HCT 34.2* 34.2* 32.4* 32.9*  PLT 295 305 306 334  MCV 91.7 91.9 92.0 92.7  MCH 31.6 31.5 30.7 30.4  MCHC 34.5 34.2 33.3 32.8  RDW 12.4 12.5 12.7 12.6  LYMPHSABS 0.7  --   --   --   MONOABS 1.5*  --   --   --   EOSABS 0.1  --   --   --   BASOSABS 0.0  --   --   --     Chemistries   Recent Labs Lab 10/09/16 1500 10/10/16 0031 10/11/16 0812 10/12/16 0311  NA 128* 129* 127* 134*  K 3.1* 3.3* 3.7 3.9  CL 93* 95* 96* 97*  CO2 GLUCOSE 193* 244* 311* 198*  BUN CREATININE 0.67 0.70 0.59 0.62  CALCIUM 8.3* 8.2* 8.2* 8.5*  AST 28  --   --   --   ALT 16  --   --   --   ALKPHOS 138*  --   --   --   BILITOT 0.5  --   --   --    ------------------------------------------------------------------------------------------------------------------ No results for input(s): CHOL, HDL, LDLCALC, TRIG, CHOLHDL, LDLDIRECT in the last 72 hours.  Lab Results  Component Value Date   HGBA1C 8.4 (H) 10/10/2016   ------------------------------------------------------------------------------------------------------------------  Recent Labs  10/10/16 0031  TSH 2.330   ------------------------------------------------------------------------------------------------------------------ No results for input(s): VITAMINB12, FOLATE, FERRITIN, TIBC, IRON, RETICCTPCT in the last 72 hours.  Coagulation profile  Recent Labs Lab 10/10/16 0811  INR 1.02    No results for input(s): DDIMER in the last 72 hours.  Cardiac Enzymes No results for input(s): CKMB, TROPONINI, MYOGLOBIN in the last 168 hours.  Invalid input(s): CK ------------------------------------------------------------------------------------------------------------------ No results found for: BNP  Micro Results Recent Results (from the past 240 hour(s))  Blood Cultures x 2 sites     Status: None  (Preliminary result)   Collection Time: 10/10/16 12:30 AM  Result Value Ref Range Status   Specimen Description BLOOD BLOOD LEFT ARM  Final   Special Requests   Final    BOTTLES DRAWN AEROBIC AND ANAEROBIC Blood Culture adequate volume   Culture NO GROWTH 1 DAY  Final   Report Status PENDING  Incomplete  Blood Cultures x 2 sites     Status: None (Preliminary result)   Collection Time: 10/10/16 12:35 AM  Result Value Ref Range Status   Specimen Description BLOOD BLOOD LEFT HAND  Final   Special Requests IN PEDIATRIC BOTTLE Blood Culture adequate volume  Final   Culture NO GROWTH 1 DAY  Final  Report Status PENDING  Incomplete  Aerobic Culture (superficial specimen)     Status: None   Collection Time: 10/10/16  3:28 AM  Result Value Ref Range Status   Specimen Description WOUND  Final   Special Requests FOOT  Final   Gram Stain   Final    NO WBC SEEN MODERATE GRAM POSITIVE COCCI IN PAIRS FEW GRAM VARIABLE ROD    Culture FEW STAPHYLOCOCCUS AUREUS WITHIN MIXED CULTURE   Final   Report Status 10/12/2016 FINAL  Final   Organism ID, Bacteria STAPHYLOCOCCUS AUREUS  Final      Susceptibility   Staphylococcus aureus - MIC*    CIPROFLOXACIN <=0.5 SENSITIVE Sensitive     ERYTHROMYCIN <=0.25 SENSITIVE Sensitive     GENTAMICIN <=0.5 SENSITIVE Sensitive     OXACILLIN <=0.25 SENSITIVE Sensitive     TETRACYCLINE <=1 SENSITIVE Sensitive     VANCOMYCIN <=0.5 SENSITIVE Sensitive     TRIMETH/SULFA <=10 SENSITIVE Sensitive     CLINDAMYCIN <=0.25 SENSITIVE Sensitive     RIFAMPIN <=0.5 SENSITIVE Sensitive     Inducible Clindamycin NEGATIVE Sensitive     * FEW STAPHYLOCOCCUS AUREUS    Radiology Reports Mr Foot Left W Wo Contrast  Result Date: 10/10/2016 CLINICAL DATA:  Large nonhealing ulcer on the plantar surface of the foot at the level of the first MTP joint. Diabetic patient. The wound was first noticed 2 weeks ago. Question osteomyelitis. EXAM: MRI OF THE LEFT FOREFOOT WITHOUT AND WITH  CONTRAST TECHNIQUE: Multiplanar, multisequence MR imaging of the left foot was performed both before and after administration of intravenous contrast. CONTRAST:  12 ml MULTIHANCE GADOBENATE DIMEGLUMINE 529 MG/ML IV SOLN COMPARISON:  Plain films left foot 10/05/2016. FINDINGS: Bones/Joint/Cartilage There is marrow edema and enhancement throughout the proximal phalanx of the great toe and in the medial 1 cm of the head and neck of the first metatarsal consistent with osteomyelitis. Marrow signal in the distal phalanx of the great toe is normal. Intense edema and enhancement are also seen in both the medial and lateral sesamoids of the first MTP joint consistent osteomyelitis. Edema and enhancement are seen in the heads of the second, third and fourth metatarsals extending 3 cm proximally into the diaphysis. The patient has fractures of the necks of the second, third and fourth metatarsals which are age indeterminate. Ligaments Intact. Muscles and Tendons There is some fatty atrophy of the intrinsic musculature of the foot. There is edema and enhancement within intrinsic musculature the foot but no intramuscular fluid collection is identified. Soft tissues A large ulceration is seen on the plantar surface of the foot at the level the first MTP joint. No soft tissue abscess is identified. There is gas in the soft tissues in the first intermetatarsal space and tracking subjacent to the its proximal first and second toes. Small first MTP joint effusion is noted. IMPRESSION: Findings consistent with osteomyelitis throughout the proximal phalanx of the great toe, medial 1 cm of the head and neck of the first metatarsal and in the medial and lateral sesamoids of the first MTP joint. Edema and enhancement in the distal 3 cm of the second, third and fourth metatarsals with fractures of the metatarsal necks identified. Signal change could be due to the fractures and altered mechanics but given the extent of cellulitis about the  foot, signal change is worrisome for osteomyelitis in the distal second, third and fourth metatarsals. No soft tissue abscess is identified. Gas in the soft tissues subjacent to the proximal first,  second and third toes worrisome for tissue necrosis. Small first MTP joint effusion could be septic or aseptic. Electronically Signed   By: Drusilla Kanner M.D.   On: 10/10/2016 08:18   Dg Chest Port 1 View  Result Date: 10/11/2016 CLINICAL DATA:  Patient with shortness of breath COPD. EXAM: PORTABLE CHEST 1 VIEW COMPARISON:  Chest radiograph 10/09/2016 FINDINGS: Patient is mildly rotated. Stable cardiac and mediastinal contours. Heterogeneous opacities left lung base. Small left pleural effusion. No pneumothorax. IMPRESSION: Heterogeneous opacities left lung base may represent atelectasis, scarring or infection. Probable small left pleural effusion. Electronically Signed   By: Annia Belt M.D.   On: 10/11/2016 11:07   Dg Chest Port 1 View  Result Date: 10/09/2016 CLINICAL DATA:  Initial evaluation for acute shortness of breath. History of asthma. EXAM: PORTABLE CHEST 1 VIEW COMPARISON:  Prior radiograph from 09/06/2015. FINDINGS: Mild cardiomegaly, stable. Mediastinal silhouette within normal limits. Lungs mildly hyperexpanded with diffuse bronchitic changes, suggesting possible underlying COPD. Chronic scarring at the left lung base is similar to previous. No consolidative opacity to suggest pneumonia. No pulmonary edema or pleural effusion. No pneumothorax. No acute osseus abnormality. IMPRESSION: 1. COPD with left basilar scarring, similar to previous. 2. No other active cardiopulmonary disease. 3. Stable cardiomegaly without pulmonary edema. Electronically Signed   By: Rise Mu M.D.   On: 10/09/2016 22:56   Dg Foot Complete Left  Result Date: 10/05/2016 CLINICAL DATA:  Plantar nonhealing ulcer. EXAM: LEFT FOOT - COMPLETE 3+ VIEW COMPARISON:  No recent prior. FINDINGS: Soft tissue ulceration  noted along the distal plantar aspect of the left foot. No radiopaque foreign bodies. Peripheral vascular calcification. Diffuse osteopenia. Diffuse severe degenerative changes. Old fractures noted of the distal aspect of the metatarsals small focal lucency noted of the distal aspect of the left second metatarsal. A small focus of osteomyelitis cannot be completely excluded. IMPRESSION: 1. Soft tissue ulceration noted on the distal plantar aspect of the left foot. No radiopaque foreign bodies. Peripheral vascular calcification consistent peripheral vascular disease. 2. Small focal lucency along the distal aspect of the left second metatarsal noted. A small focus of osteomyelitis cannot be excluded. 3. Diffuse osteopenia, degenerative change. Old fractures of the distal metatarsals. Electronically Signed   By: Maisie Fus  Register   On: 10/05/2016 16:42    Time Spent in minutes  30   Susa Raring M.D on 10/12/2016 at 9:40 AM  Between 7am to 7pm - Pager - [phone] ( page via [facility], text pages only, please mention full 10 digit call back number). After 7pm go to [email or web address] - password [facility]

## 2016-10-12 NOTE — Transfer of Care (Signed)
Immediate Anesthesia Transfer of Care Note  Patient: Tara Flores  Procedure(s) Performed: Procedure(s): TRANSMETATARSAL AMPUTATION LEFT FOOT WITH ACHILLES TENDON LENGTHENING (Left)  Patient Location: PACU  Anesthesia Type:MAC and Regional  Level of Consciousness: awake, alert  and patient cooperative  Airway & Oxygen Therapy: Patient Spontanous Breathing and Patient connected to nasal cannula oxygen  Post-op Assessment: Report given to RN and Post -op Vital signs reviewed and stable  Post vital signs: Reviewed and stable  Last Vitals:  Vitals:   10/11/16 1413 10/12/16 1404  BP: (!) 141/65 (!) 169/74  Pulse: 60 67  Resp: 16 18  Temp: 36.4 C 36.8 C    Last Pain:  Vitals:   10/12/16 1404  TempSrc: Oral  PainSc:          Complications: No apparent anesthesia complications

## 2016-10-12 NOTE — Interval H&P Note (Signed)
History and Physical Interval Note:  10/12/2016 5:44 PM  Tara Flores  has presented today for surgery, with the diagnosis of OSTEOMYELITIS  The various methods of treatment have been discussed with the patient and family. After consideration of risks, benefits and other options for treatment, the patient has consented to  Procedure(s): TRANSMETATARSAL AMPUTATION LEFT FOOT WITH ACHILLES TENDON LENGTHENING (Left) as a surgical intervention .  The patient's history has been reviewed, patient examined, no change in status, stable for surgery.  I have reviewed the patient's chart and labs.  Questions were answered to the patient's satisfaction.     Felecia Shelling

## 2016-10-12 NOTE — Consult Note (Signed)
WOC consulted for neuropathic foot ulcer, reviewed MRI results.  Treatment of osteomyelitis is considered outside the scope of practice for the [facility] nurse.  It is also noted that podiatry has been consulted on this patient and will take this patient for surgical intervention. For that reason WOC nurse will not consult today.  Tara Flores [facility], CNS [phone]

## 2016-10-12 NOTE — Brief Op Note (Signed)
10/09/2016 - 10/12/2016  8:56 PM  PATIENT:  Tara Flores  81 y.o. female  PRE-OPERATIVE DIAGNOSIS:  OSTEOMYELITIS  POST-OPERATIVE DIAGNOSIS:  OSTEOMYELITIS  PROCEDURE:  Procedure(s): TRANSMETATARSAL AMPUTATION LEFT FOOT WITH ACHILLES TENDON LENGTHENING (Left)  INCISION AND DRAINAGE LEFT FOOT  SURGEON:  Surgeon(s) and Role:    * Felecia Shelling, DPM - Primary  PHYSICIAN ASSISTANT: None  ASSISTANTS: none   ANESTHESIA:   regional  EBL:  Total I/O In: 100 [I.V.:100] Out: 100 [Blood:100]  BLOOD ADMINISTERED:none  DRAINS: none   LOCAL MEDICATIONS USED:  MARCAINE    and LIDOCAINE   SPECIMEN:  Source of Specimen:  Deep wound cultures  DISPOSITION OF SPECIMEN:  PATHOLOGY  COUNTS:  YES  TOURNIQUET:   Total Tourniquet Time Documented: Calf (Left) - 37 minutes Total: Calf (Left) - 37 minutes   DICTATION: .Reubin Milan Dictation  PLAN OF CARE: Patient has current admission orders  PATIENT DISPOSITION:  PACU - hemodynamically stable.   Delay start of Pharmacological VTE agent (>24hrs) due to surgical blood loss or risk of bleeding: not applicable  Felecia Shelling, DPM [facility]  Dr. Felecia Shelling, DPM    [address]                                        [place], [place] [address]                Office [phone]  Fax [phone]

## 2016-10-12 NOTE — Patient Outreach (Signed)
Care coordination:  Patient is admitted to hospital.  PLAN:  In basket messages sent to hospital liaison to follow while inpatient. Will also send message to [facility] social worker.   [name], RN, BSN, CEN [facility] [phone]

## 2016-10-12 NOTE — H&P (View-Only) (Signed)
 PODIATRY CONSULTATION  REASON FOR CONSULT: Ulcer/osteomyelitis left foot secondary to DM type II.   Tara Flores MRN:[id number] DOB: 08/01/1935 DOA: 10/09/2016  Referring MD/NP/PA: Dr. Cardama PCP: EUBANKS, JESSICA K, NP   Chief Complaint: Foot wound Chief Complaint  Patient presents with  . Foot Pain   BRIEF HISTORY: 81-year-old female with a medical history of hypertension, type 2 diabetes, hypothyroidism, CAD, and tobacco use presents regarding a left foot ulceration. Patient denies any significant trauma or pain. She believes that the ulcer has been present for approximately 2 weeks now. Upon admission to the hospital the patient has been placed on IV antibiotics including Zosyn and vancomycin and podiatry was consulted for evaluation of the ulceration and possible surgical intervention.  Patient presents this morning with her daughter  General: The patient is alert and oriented x3 in no acute distress.  Dermatology: Large ulceration noted to the sub-first MPJ of the left foot approximately 3 cm in diameter. Wound base is 100% fibrotic. There is a strong malodor noted. Periwound is significantly macerated. There is cellulitic and edematous changes diffusely throughout the left foot more focused in the forefoot.  Vascular: Erythema noted to the left lower extremity more specific to the left forefoot and digits. Pedal pulses do not appear to be palpable either due to peripheral vascular disease or edematous changes.  Neurological: Epicritic and protective threshold absent bilaterally.   Muskuloskeletal: Mild equinus deformity noted to the left lower extremity contributory to the plantar forefoot ulceration.  MRI IMPRESSION: Findings consistent with osteomyelitis throughout the proximal phalanx of the great toe, medial 1 cm of the head and neck of the first metatarsal and in the medial and lateral sesamoids of the first MTP joint.  Edema and enhancement in the distal 3 cm of the  second, third and fourth metatarsals with fractures of the metatarsal necks identified. Signal change could be due to the fractures and altered mechanics but given the extent of cellulitis about the foot, signal change is worrisome for osteomyelitis in the distal second, third and fourth metatarsals.  No soft tissue abscess is identified. Gas in the soft tissues subjacent to the proximal first, second and third toes worrisome for tissue necrosis.  Small first MTP joint effusion could be septic or aseptic.  Assessment: #1 ulcer sub-first MPJ left foot secondary to diabetes mellitus 2. Gas gangrene with tissue necrosis first intermetatarsal space left foot 3. Osteomyelitis left foot. Please see MRI impression.  4. PVD left lower extremity  Plan of Care:  1. The patient was evaluated this a.m. with the daughter present 2. Based on clinical exam and MRI findings the patient will require surgical intervention. Recommend transmetatarsal amputation with incision and drainage and tendo Achilles lengthening left lower extremity. Surgery will be tentatively scheduled for Monday, 10/11/2016, at 5:30 PM 3. Continue IV antibiotics. Patient will likely require 6 weeks IV antibiotics postsurgically.  4. Recommend vascular consult. ABI and arterial Doppler ordered today. 5. The patient will follow up in the office post discharge.  Thank you for the consult. I'll continue to follow while inpatient. Any questions please contact me directly [phone].    Brent M. Evans, DPM [facility]  Dr. Brent M. Evans, DPM    [address]                                          [place], [place] [address]                Office [phone]  Fax [phone]

## 2016-10-12 NOTE — Progress Notes (Signed)
Orthopedic Tech Progress Note Patient Details:  Tara Flores 1935/10/20 [id number]  Ortho Devices Type of Ortho Device: CAM walker Ortho Device/Splint Location: LLE Ortho Device/Splint Interventions: Casandra Doffing 10/12/2016, 9:16 PM

## 2016-10-12 NOTE — Anesthesia Procedure Notes (Signed)
Anesthesia Regional Block: Adductor canal block   Pre-Anesthetic Checklist: ,, timeout performed, Correct Patient, Correct Site, Correct Laterality, Correct Procedure, Correct Position, site marked, Risks and benefits discussed,  Surgical consent,  Pre-op evaluation,  At surgeon's request and post-op pain management  Laterality: Left  Prep: chloraprep       Needles:   Needle Type: Echogenic Stimulator Needle          Additional Needles:   Procedures: ultrasound guided,,,,,,,,  Narrative:  Start time: 10/12/2016 6:40 PM End time: 10/12/2016 6:45 PM Injection made incrementally with aspirations every 5 mL.  Performed by: Personally   Additional Notes: 20 cc 0.5% Naropin injected easily

## 2016-10-13 ENCOUNTER — Encounter (HOSPITAL_COMMUNITY): Payer: Self-pay | Admitting: Podiatry

## 2016-10-13 ENCOUNTER — Other Ambulatory Visit: Payer: Self-pay | Admitting: *Deleted

## 2016-10-13 LAB — BASIC METABOLIC PANEL
Anion gap: 9 (ref 5–15)
BUN: 13 mg/dL (ref 6–20)
CALCIUM: 8.3 mg/dL — AB (ref 8.9–10.3)
CO2: 28 mmol/L (ref 22–32)
CREATININE: 0.6 mg/dL (ref 0.44–1.00)
Chloride: 94 mmol/L — ABNORMAL LOW (ref 101–111)
GFR calc Af Amer: >60 mL/min (ref >60)
GLUCOSE: 278 mg/dL — AB (ref 65–99)
Potassium: 3.7 mmol/L (ref 3.5–5.1)
SODIUM: 131 mmol/L — AB (ref 135–145)

## 2016-10-13 LAB — GLUCOSE, CAPILLARY
GLUCOSE-CAPILLARY: 222 mg/dL — AB (ref 65–99)
GLUCOSE-CAPILLARY: 252 mg/dL — AB (ref 65–99)
GLUCOSE-CAPILLARY: 284 mg/dL — AB (ref 65–99)
Glucose-Capillary: 213 mg/dL — ABNORMAL HIGH (ref 65–99)
Glucose-Capillary: 225 mg/dL — ABNORMAL HIGH (ref 65–99)

## 2016-10-13 LAB — CBC
HCT: 31.6 % — ABNORMAL LOW (ref 36.0–46.0)
HEMOGLOBIN: 10.4 g/dL — AB (ref 12.0–15.0)
MCH: 30.6 pg (ref 26.0–34.0)
MCHC: 32.9 g/dL (ref 30.0–36.0)
MCV: 92.9 fL (ref 78.0–100.0)
PLATELETS: 365 10*3/uL (ref 150–400)
RBC: 3.4 MIL/uL — ABNORMAL LOW (ref 3.87–5.11)
RDW: 12.7 % (ref 11.5–15.5)
WBC: 12.4 10*3/uL — ABNORMAL HIGH (ref 4.0–10.5)

## 2016-10-13 MED ORDER — IPRATROPIUM-ALBUTEROL 0.5-2.5 (3) MG/3ML IN SOLN
3.0000 mL | Freq: Once | RESPIRATORY_TRACT | Status: AC
  Administered 2016-10-13: 3 mL via RESPIRATORY_TRACT
  Filled 2016-10-13: qty 3

## 2016-10-13 MED ORDER — CEFAZOLIN SODIUM-DEXTROSE 2-4 GM/100ML-% IV SOLN: 2.0000 g | Freq: Three times a day (TID) | INTRAVENOUS | 0 refills | Status: DC

## 2016-10-13 MED ORDER — ACETAMINOPHEN 325 MG PO TABS: 650.0000 mg | ORAL_TABLET | Freq: Four times a day (QID) | ORAL | Status: DC | PRN

## 2016-10-13 MED ORDER — INSULIN ASPART 100 UNIT/ML ~~LOC~~ SOLN: SUBCUTANEOUS | 0 refills | Status: DC

## 2016-10-13 MED ORDER — OXYCODONE-ACETAMINOPHEN 5-325 MG PO TABS
1.0000 | ORAL_TABLET | Freq: Four times a day (QID) | ORAL | 0 refills | Status: DC | PRN
Start: 2016-10-13 — End: 2017-02-16

## 2016-10-13 MED ORDER — FUROSEMIDE 10 MG/ML IJ SOLN: 20.0000 mg | Freq: Once | INTRAMUSCULAR | Status: DC

## 2016-10-13 MED ORDER — INSULIN GLARGINE 100 UNIT/ML ~~LOC~~ SOLN: 15.0000 [IU] | Freq: Two times a day (BID) | SUBCUTANEOUS | 0 refills | Status: DC

## 2016-10-13 MED ORDER — NICOTINE 21 MG/24HR TD PT24: 21.0000 mg | MEDICATED_PATCH | Freq: Every day | TRANSDERMAL | 0 refills | Status: DC

## 2016-10-13 NOTE — Patient Outreach (Signed)
[facility]) Care Management  10/13/2016  Tara Flores March 08, 1936 [id number]   [facility] CSW advised that patient was hospitalized with plans for SNF rehab at dc.  CSW has attempted to touch base with daughter, Avon Gully, today by phone and message was left.  CSW will follow and await further update on hospitla dc and disposition.    Reece Levy, MSW, LCSW Clinical Social Worker  [facility] [employer] [phone]

## 2016-10-13 NOTE — NC FL2 (Signed)
[place] MEDICAID FL2 LEVEL OF CARE SCREENING TOOL     IDENTIFICATION  Patient Name: Tara Flores Birthdate: 19-Nov-1935 Sex: female Admission Date (Current Location): 10/09/2016  [facility] and [place] Number:  Producer, television/film/video and Address:  [facility], [address] [place], [place] [address]      Provider Number: [id number]  Attending Physician Name and Address:  Leroy Sea, MD  Relative Name and Phone Number:       Current Level of Care: Hospital Recommended Level of Care: Skilled Nursing Facility Prior Approval Number:    Date Approved/Denied: 10/13/16 PASRR Number: [id number]  Discharge Plan: SNF    Current Diagnoses: Patient Active Problem List   Diagnosis Date Noted  . Diabetic foot ulcer (HCC) 10/10/2016  . Hypokalemia 10/10/2016  . Anemia due to chronic kidney disease 10/10/2016  . Cellulitis in diabetic foot (HCC) 10/09/2016  . Venous ulcer of left leg (HCC) 07/28/2016  . Dry skin dermatitis 07/28/2016  . Type 2 diabetes mellitus with hyperglycemia (HCC) 02/06/2015  . Hip fracture (HCC) 02/05/2015  . Closed right hip fracture (HCC) 02/05/2015  . Abnormal EKG 02/05/2015  . Tobacco abuse 02/05/2015  . DM2 (diabetes mellitus, type 2) (HCC) 02/05/2015  . Essential hypertension 02/05/2015  . Hypothyroidism 02/05/2015  . Coronary artery disease involving native coronary artery of native heart without angina pectoris     Orientation RESPIRATION BLADDER Height & Weight     Self, Time, Situation, Place  Normal Continent Weight: 142 lb 10.2 oz (64.7 kg) Height:   (154.9 cm)  BEHAVIORAL SYMPTOMS/MOOD NEUROLOGICAL BOWEL NUTRITION STATUS    Convulsions/Seizures Continent Diet (See Dc summary)  AMBULATORY STATUS COMMUNICATION OF NEEDS Skin   Limited Assist Verbally Surgical wounds (Left foot incision, Venous Stasis left foot ulcer)                       Personal Care Assistance Level of Assistance  Dressing,  Feeding, Bathing Bathing Assistance: Limited assistance Feeding assistance: Limited assistance Dressing Assistance: Limited assistance     Functional Limitations Info  Sight, Hearing, Speech Sight Info: Adequate Hearing Info: Adequate Speech Info: Adequate    SPECIAL CARE FACTORS FREQUENCY  PT (By licensed PT), OT (By licensed OT)     PT Frequency: 5xweek OT Frequency: 5xweek            Contractures      Additional Factors Info  Allergies, Code Status, Insulin Sliding Scale Code Status Info: DNR Allergies Info: BRETHINE TERBUTALINE, KEFLEX CEPHALEXIN    Insulin Sliding Scale Info: 15 units 3x's day; 5 at bedtime; 12 2x's aday       Current Medications (10/13/2016):  This is the current hospital active medication list Current Facility-Administered Medications  Medication Dose Route Frequency Provider Last Rate Last Dose  . acetaminophen (TYLENOL) tablet 650 mg  650 mg Oral Q6H PRN Clydie Braun, MD   650 mg at 10/13/16 0016   Or  . acetaminophen (TYLENOL) suppository 650 mg  650 mg Rectal Q6H PRN Clydie Braun, MD      . albuterol (PROVENTIL) (2.5 MG/3ML) 0.083% nebulizer solution 2.5 mg  2.5 mg Nebulization Q6H PRN Clydie Braun, MD   2.5 mg at 10/12/16 2251  . aspirin chewable tablet 81 mg  81 mg Oral Daily Clydie Braun, MD   81 mg at 10/13/16 0856  . carvedilol (COREG) tablet 6.25 mg  6.25 mg Oral BID WC [name] A  Katrinka Blazing, MD   6.25 mg at 10/13/16 0856  . ceFAZolin (ANCEF) IVPB 2g/100 mL premix  2 g Intravenous Q8H Darl Householder Masters, [facility]   Stopped at 10/13/16 1302  . enoxaparin (LOVENOX) injection 40 mg  40 mg Subcutaneous Daily [name] A Smith, MD      . insulin aspart (novoLOG) injection 0-15 Units  0-15 Units Subcutaneous TID WC Clydie Braun, MD   5 Units at 10/13/16 1232  . insulin aspart (novoLOG) injection 0-5 Units  0-5 Units Subcutaneous QHS Clydie Braun, MD   5 Units at 10/12/16 1835  . insulin glargine (LANTUS) injection 12 Units  12 Units  Subcutaneous BID Leroy Sea, MD   12 Units at 10/13/16 [phone]  . levothyroxine (SYNTHROID, LEVOTHROID) tablet 150 mcg  150 mcg Oral QAC breakfast Clydie Braun, MD   150 mcg at 10/13/16 9604  . lisinopril (PRINIVIL,ZESTRIL) tablet 10 mg  10 mg Oral Daily Clydie Braun, MD   10 mg at 10/13/16 0856  . nicotine (NICODERM CQ - dosed in mg/24 hours) patch 21 mg  21 mg Transdermal Daily Roma Kayser Schorr, NP   21 mg at 10/13/16 0900  . ondansetron (ZOFRAN) injection 4 mg  4 mg Intravenous Q6H PRN Clydie Braun, MD      . oxyCODONE-acetaminophen (PERCOCET/ROXICET) 5-325 MG per tablet 1-2 tablet  1-2 tablet Oral Q4H PRN Felecia Shelling, DPM   2 tablet at 10/13/16 [phone]  . promethazine (PHENERGAN) tablet 12.5 mg  12.5 mg Oral Q4H PRN Felecia Shelling, DPM         Discharge Medications: Please see discharge summary for a list of discharge medications.  Relevant Imaging Results:  Relevant Lab Results:   Additional Information [id number]  Tresa Moore, LCSW

## 2016-10-13 NOTE — Discharge Summary (Signed)
Tara Flores XUX:[id number] DOB: 1935-10-26 DOA: 10/09/2016  PCP: Lauree Chandler, NP  Admit date: 10/09/2016  Discharge date: 10/13/2016  Admitted From: Home   Disposition:  SNF   Recommendations for Outpatient Follow-up:   Follow up with PCP in 1-2 weeks  PCP Please obtain BMP/CBC, 2 view CXR in 1week,  (see Discharge instructions)   PCP Please follow up on the following pending results: Monitor renal function, CBC and left foot postoperative wound closely   Home Health: None  Equipment/Devices: Cam Walker Consultations: Podiatry Discharge Condition: Stable  CODE STATUS: DNR   Diet Recommendation: Diet Carb Modified Heart Healthy     Chief Complaint  Patient presents with  . Foot Pain     Brief history of present illness from the day of admission and additional interim summary     Tara L Ulrichis a 81 y.o.femalewith medical history significant of HTN, DM type II, hypothyroidism, CAD, and tobacco abuse; who presents with complaints of worsening right foot wound, Looks like the wound has been present for at least 2 weeks, she also has severe peripheral neuropathy frequently steps on things without realizing. For the last few days her leg has been getting hot and warm and there has been some foul-smelling discharge from the wound. Came to the ER workup showed left foot cellulitis with possible underlying osteomyelitis.                                                                  Hospital Course    1. Left foot cellulitis with possible underlying cellulitis and septic arthritis on MRI. Podiatry on board she underwent left transmetatarsal amputation on 10/12/2016, She tolerated the procedure well with minimal perioperative blood loss treated anemia not requiring transfusion, discussed the case with  podiatrist today cleared for home discharge with 6 weeks of IV antibiotics via PICC line for residual osteomyelitis, her case was also discussed by me with ID physician Dr. Johnnye Sima on 10/12/2016. 6 weeks of IV Ancef will be arranged with a stop date of 11/24/2016. Patient must follow with podiatry within 3-4 days for first dressing change. We'll go to SNF.  2. CAD. Under medical treatment. Ongoing smoking one to 2 packs a day. Poorly controlled diabetes mellitus. She is chest pain now symptom-free, on aspirin, Coreg along with ACE inhibitor for secondary prevention. Stable baseline EKG.  3. HTN . Continue home regimen and monitor.  4. Hypothyroidism. Synthroid.  5. Hyponatremia. SIADH, much better after Lasix , check BMP in a week at SNF.  6.  DM Type II. Placed on Lantus and sliding scale.  onto CBGs before every meal at bedtime.    Discharge diagnosis     Principal Problem:   Diabetic foot ulcer ([place]) Active Problems:   Tobacco abuse   DM2 (  diabetes mellitus, type 2) ([place])   Essential hypertension   Hypothyroidism   Coronary artery disease involving native coronary artery of native heart without angina pectoris   Cellulitis in diabetic foot ([facility])   Hypokalemia   Anemia due to chronic kidney disease    Discharge instructions    Discharge Instructions    Discharge instructions    Complete by:  As directed    Follow with Primary MD Dewaine Oats, JESSICA K, NP in 7 days   Get CBC, CMP, 2 view Chest X ray checked  by Primary MD or SNF MD in 5-7 days ( we routinely change or add medications that can affect your baseline labs and fluid status, therefore we recommend that you get the mentioned basic workup next visit with your PCP, your PCP may decide not to get them or add new tests based on their clinical decision)  Activity: As tolerated with Full fall precautions use walker/cane & assistance as needed  Disposition SNF  Diet:   Diet Carb Modified   Heart Healthy     Accuchecks 4 times/day, Once in AM empty stomach and then before each meal. Log in all results and show them to your Prim.MD in 3 days. If any glucose reading is under 80 or above 300 call your Prim MD immidiately. Follow Low glucose instructions for glucose under 80 as instructed.  For Heart failure patients - Check your Weight same time everyday, if you gain over 2 pounds, or you develop in leg swelling, experience more shortness of breath or chest pain, call your Primary MD immediately. Follow Cardiac Low Salt Diet and 1.5 lit/day fluid restriction.  On your next visit with your primary care physician please Get Medicines reviewed and adjusted.  Please request your Prim.MD to go over all Hospital Tests and Procedure/Radiological results at the follow up, please get all Hospital records sent to your Prim MD by signing hospital release before you go home.  If you experience worsening of your admission symptoms, develop shortness of breath, life threatening emergency, suicidal or homicidal thoughts you must seek medical attention immediately by calling 911 or calling your MD immediately  if symptoms less severe.  You Must read complete instructions/literature along with all the possible adverse reactions/side effects for all the Medicines you take and that have been prescribed to you. Take any new Medicines after you have completely understood and accpet all the possible adverse reactions/side effects.   Do not drive, operate heavy machinery, perform activities at heights, swimming or participation in water activities or provide baby sitting services if your were admitted for syncope or siezures until you have seen by Primary MD or a Neurologist and advised to do so again.  Do not drive when taking Pain medications.    Do not take more than prescribed Pain, Sleep and Anxiety Medications  Special Instructions: If you have smoked or chewed Tobacco  in the last 2 yrs please stop smoking, stop  any regular Alcohol  and or any Recreational drug use.  Wear Seat belts while driving.   Please note  You were cared for by a hospitalist during your hospital stay. If you have any questions about your discharge medications or the care you received while you were in the hospital after you are discharged, you can call the unit and asked to speak with the hospitalist on call if the hospitalist that took care of you is not available. Once you are discharged, your primary care physician will handle any further medical  issues. Please note that NO REFILLS for any discharge medications will be authorized once you are discharged, as it is imperative that you return to your primary care physician (or establish a relationship with a primary care physician if you do not have one) for your aftercare needs so that they can reassess your need for medications and monitor your lab values.   Increase activity slowly    Complete by:  As directed       Discharge Medications   Allergies as of 10/13/2016      Reactions   Brethine [terbutaline]    Made patient confused   Keflex [cephalexin] Nausea Only   Loss of appetite      Medication List    STOP taking these medications   insulin NPH Human 100 UNIT/ML injection Commonly known as:  HUMULIN N,NOVOLIN N     TAKE these medications   ACCU-CHEK FASTCLIX LANCETS Misc Check blood sugar 2-3 times daily DX E11.9   ACCU-CHEK NANO SMARTVIEW w/Device Kit Check blood sugar 2-3 times daily DX E11.9   ACCU-CHEK SMARTVIEW test strip Generic drug:  glucose blood Check blood sugar 2-3 times daily DX E11.9   acetaminophen 325 MG tablet Commonly known as:  TYLENOL Take 2 tablets (650 mg total) by mouth every 6 (six) hours as needed for mild pain (or Fever >/= 101).   albuterol 108 (90 Base) MCG/ACT inhaler Commonly known as:  PROVENTIL HFA;VENTOLIN HFA Inhale 1 puff into the lungs every 6 (six) hours as needed for wheezing or shortness of breath. Reported on  09/06/2015 What changed:  Another medication with the same name was changed. Make sure you understand how and when to take each.   albuterol (2.5 MG/3ML) 0.083% nebulizer solution Commonly known as:  PROVENTIL 2.61m per 394mevery 6 hours as needed for shortness of breath/wheezing What changed:  how much to take  how to take this  when to take this  reasons to take this  additional instructions   aspirin 81 MG tablet Take 81 mg by mouth daily.   carvedilol 6.25 MG tablet Commonly known as:  COREG Take 1 tablet (6.25 mg total) by mouth 2 (two) times daily with a meal.   ceFAZolin 2-4 GM/100ML-% IVPB Commonly known as:  ANCEF Inject 100 mLs (2 g total) into the vein every 8 (eight) hours.   insulin aspart 100 UNIT/ML injection Commonly known as:  NOVOLOG Before each meal 3 times a day, 140-199 - 2 units, 200-250 - 4 units, 251-299 - 6 units,  300-349 - 8 units,  350 or above 10 units. Insulin PEN if approved, provide syringes and needles if needed.   insulin glargine 100 UNIT/ML injection Commonly known as:  LANTUS Inject 0.15 mLs (15 Units total) into the skin 2 (two) times daily.   levothyroxine 150 MCG tablet Commonly known as:  SYNTHROID, LEVOTHROID Take 150 mcg by mouth daily before breakfast.   lisinopril 10 MG tablet Commonly known as:  PRINIVIL,ZESTRIL Take 1 tablet (10 mg total) by mouth daily.   nicotine 21 mg/24hr patch Commonly known as:  NICODERM CQ - dosed in mg/24 hours Place 1 patch (21 mg total) onto the skin daily. Start taking on:  10/14/2016   nitroGLYCERIN 0.4 MG SL tablet Commonly known as:  NITROSTAT Place 1 tablet (0.4 mg total) under the tongue every 5 (five) minutes as needed for chest pain.   oxyCODONE-acetaminophen 5-325 MG tablet Commonly known as:  PERCOCET/ROXICET Take 1 tablet by mouth every 6 (six)  hours as needed for severe pain.       Follow-up Information    Lauree Chandler, NP. Schedule an appointment as soon as possible  for a visit in 1 week(s).   Specialty:  Geriatric Medicine Contact information: [address] [place] [address] [phone]        Brent M Evans, DPM. Schedule an appointment as soon as possible for a visit in 2 day(s).   Specialty:  Podiatry Contact information: [address] [place] [address] [phone]           Major procedures and Radiology Reports - PLEASE review detailed and final reports thoroughly  -      L Foot - TRANSMETATARSAL AMPUTATION LEFT FOOT WITH ACHILLES TENDON LENGTHENING (Left)INCISION AND DRAINAGE LEFT FOOT   Mr Foot Left W Wo Contrast  Result Date: 10/10/2016 CLINICAL DATA:  Large nonhealing ulcer on the plantar surface of the foot at the level of the first MTP joint. Diabetic patient. The wound was first noticed 2 weeks ago. Question osteomyelitis. EXAM: MRI OF THE LEFT FOREFOOT WITHOUT AND WITH CONTRAST TECHNIQUE: Multiplanar, multisequence MR imaging of the left foot was performed both before and after administration of intravenous contrast. CONTRAST:  12 ml MULTIHANCE GADOBENATE DIMEGLUMINE 529 MG/ML IV SOLN COMPARISON:  Plain films left foot 10/05/2016. FINDINGS: Bones/Joint/Cartilage There is marrow edema and enhancement throughout the proximal phalanx of the great toe and in the medial 1 cm of the head and neck of the first metatarsal consistent with osteomyelitis. Marrow signal in the distal phalanx of the great toe is normal. Intense edema and enhancement are also seen in both the medial and lateral sesamoids of the first MTP joint consistent osteomyelitis. Edema and enhancement are seen in the heads of the second, third and fourth metatarsals extending 3 cm proximally into the diaphysis. The patient has fractures of the necks of the second, third and fourth metatarsals which are age indeterminate. Ligaments Intact. Muscles and Tendons There is some fatty atrophy of the intrinsic musculature of the foot. There is edema and  enhancement within intrinsic musculature the foot but no intramuscular fluid collection is identified. Soft tissues A large ulceration is seen on the plantar surface of the foot at the level the first MTP joint. No soft tissue abscess is identified. There is gas in the soft tissues in the first intermetatarsal space and tracking subjacent to the its proximal first and second toes. Small first MTP joint effusion is noted. IMPRESSION: Findings consistent with osteomyelitis throughout the proximal phalanx of the great toe, medial 1 cm of the head and neck of the first metatarsal and in the medial and lateral sesamoids of the first MTP joint. Edema and enhancement in the distal 3 cm of the second, third and fourth metatarsals with fractures of the metatarsal necks identified. Signal change could be due to the fractures and altered mechanics but given the extent of cellulitis about the foot, signal change is worrisome for osteomyelitis in the distal second, third and fourth metatarsals. No soft tissue abscess is identified. Gas in the soft tissues subjacent to the proximal first, second and third toes worrisome for tissue necrosis. Small first MTP joint effusion could be septic or aseptic. Electronically Signed   By: Inge Rise M.D.   On: 10/10/2016 08:18   Dg Chest Port 1 View  Result Date: 10/11/2016 CLINICAL DATA:  Patient with shortness of breath COPD. EXAM: PORTABLE CHEST 1 VIEW COMPARISON:  Chest radiograph 10/09/2016  FINDINGS: Patient is mildly rotated. Stable cardiac and mediastinal contours. Heterogeneous opacities left lung base. Small left pleural effusion. No pneumothorax. IMPRESSION: Heterogeneous opacities left lung base may represent atelectasis, scarring or infection. Probable small left pleural effusion. Electronically Signed   By: Lovey Newcomer M.D.   On: 10/11/2016 11:07   Dg Chest Port 1 View  Result Date: 10/09/2016 CLINICAL DATA:  Initial evaluation for acute shortness of breath. History  of asthma. EXAM: PORTABLE CHEST 1 VIEW COMPARISON:  Prior radiograph from 09/06/2015. FINDINGS: Mild cardiomegaly, stable. Mediastinal silhouette within normal limits. Lungs mildly hyperexpanded with diffuse bronchitic changes, suggesting possible underlying COPD. Chronic scarring at the left lung base is similar to previous. No consolidative opacity to suggest pneumonia. No pulmonary edema or pleural effusion. No pneumothorax. No acute osseus abnormality. IMPRESSION: 1. COPD with left basilar scarring, similar to previous. 2. No other active cardiopulmonary disease. 3. Stable cardiomegaly without pulmonary edema. Electronically Signed   By: Jeannine Boga M.D.   On: 10/09/2016 22:56   Dg Foot Complete Left  Result Date: 10/13/2016 CLINICAL DATA:  Postoperative radiograph, status post amputation at the left foot. Initial encounter. EXAM: LEFT FOOT - COMPLETE 3+ VIEW COMPARISON:  Left foot MRI performed 10/10/2016, and left foot radiographs performed 10/05/2016 FINDINGS: The patient is status post amputation across the midfoot, including portions of the cuneiforms, with overlying postoperative soft tissue swelling. Associated skin staples are seen. Scattered postoperative soft tissue air tracks about the dorsal midfoot and ankle. Scattered vascular calcifications are seen. IMPRESSION: Status post amputation across the midfoot. Electronically Signed   By: Garald Balding M.D.   On: 10/13/2016 00:48   Dg Foot Complete Left  Result Date: 10/05/2016 CLINICAL DATA:  Plantar nonhealing ulcer. EXAM: LEFT FOOT - COMPLETE 3+ VIEW COMPARISON:  No recent prior. FINDINGS: Soft tissue ulceration noted along the distal plantar aspect of the left foot. No radiopaque foreign bodies. Peripheral vascular calcification. Diffuse osteopenia. Diffuse severe degenerative changes. Old fractures noted of the distal aspect of the metatarsals small focal lucency noted of the distal aspect of the left second metatarsal. A small  focus of osteomyelitis cannot be completely excluded. IMPRESSION: 1. Soft tissue ulceration noted on the distal plantar aspect of the left foot. No radiopaque foreign bodies. Peripheral vascular calcification consistent peripheral vascular disease. 2. Small focal lucency along the distal aspect of the left second metatarsal noted. A small focus of osteomyelitis cannot be excluded. 3. Diffuse osteopenia, degenerative change. Old fractures of the distal metatarsals. Electronically Signed   By: Marcello Moores  Register   On: 10/05/2016 16:42    Micro Results     Recent Results (from the past 240 hour(s))  Blood Cultures x 2 sites     Status: None (Preliminary result)   Collection Time: 10/10/16 12:30 AM  Result Value Ref Range Status   Specimen Description BLOOD BLOOD LEFT ARM  Final   Special Requests   Final    BOTTLES DRAWN AEROBIC AND ANAEROBIC Blood Culture adequate volume   Culture NO GROWTH 2 DAYS  Final   Report Status PENDING  Incomplete  Blood Cultures x 2 sites     Status: None (Preliminary result)   Collection Time: 10/10/16 12:35 AM  Result Value Ref Range Status   Specimen Description BLOOD BLOOD LEFT HAND  Final   Special Requests IN PEDIATRIC BOTTLE Blood Culture adequate volume  Final   Culture NO GROWTH 2 DAYS  Final   Report Status PENDING  Incomplete  Aerobic Culture (superficial specimen)  Status: None   Collection Time: 10/10/16  3:28 AM  Result Value Ref Range Status   Specimen Description WOUND  Final   Special Requests FOOT  Final   Gram Stain   Final    NO WBC SEEN MODERATE GRAM POSITIVE COCCI IN PAIRS FEW GRAM VARIABLE ROD    Culture FEW STAPHYLOCOCCUS AUREUS WITHIN MIXED CULTURE   Final   Report Status 10/12/2016 FINAL  Final   Organism ID, Bacteria STAPHYLOCOCCUS AUREUS  Final      Susceptibility   Staphylococcus aureus - MIC*    CIPROFLOXACIN <=0.5 SENSITIVE Sensitive     ERYTHROMYCIN <=0.25 SENSITIVE Sensitive     GENTAMICIN <=0.5 SENSITIVE Sensitive       OXACILLIN <=0.25 SENSITIVE Sensitive     TETRACYCLINE <=1 SENSITIVE Sensitive     VANCOMYCIN <=0.5 SENSITIVE Sensitive     TRIMETH/SULFA <=10 SENSITIVE Sensitive     CLINDAMYCIN <=0.25 SENSITIVE Sensitive     RIFAMPIN <=0.5 SENSITIVE Sensitive     Inducible Clindamycin NEGATIVE Sensitive     * FEW STAPHYLOCOCCUS AUREUS  Aerobic/Anaerobic Culture (surgical/deep wound)     Status: None (Preliminary result)   Collection Time: 10/12/16  7:28 PM  Result Value Ref Range Status   Specimen Description WOUND  Final   Special Requests TRANSMETATARSAL AMPUTATION OF LEFT FOOT  Final   Gram Stain   Final    FEW WBC PRESENT, PREDOMINANTLY PMN NO ORGANISMS SEEN    Culture PENDING  Incomplete   Report Status PENDING  Incomplete    Today   Subjective    Tara Flores today has no headache,no chest abdominal pain,no new weakness tingling or numbness, feels much better  Objective   Blood pressure (!) 162/71, pulse 70, temperature 98.6 F (37 C), temperature source Oral, resp. rate 19, height 5' 1"  (1.549 m), weight 64.7 kg (142 lb 10.2 oz), SpO2 99 %.   Intake/Output Summary (Last 24 hours) at 10/13/16 0939 Last data filed at 10/13/16 0617  Gross per 24 hour  Intake              200 ml  Output              825 ml  Net             -625 ml    Exam Pleasantly confused, No new F.N deficits,   [place].AT,PERRAL Supple Neck,No JVD, No cervical lymphadenopathy appriciated.  Symmetrical Chest wall movement, Good air movement bilaterally, CTAB RRR,No Gallops,Rubs or new Murmurs, No Parasternal Heave +ve B.Sounds, Abd Soft, Non tender, No organomegaly appriciated, No rebound -guarding or rigidity. No Cyanosis, Clubbing or edema, No new Rash or bruise, R foot in badage   Data Review   CBC w Diff: Lab Results  Component Value Date   WBC 12.4 (H) 10/13/2016   HGB 10.4 (L) 10/13/2016   HCT 31.6 (L) 10/13/2016   HCT 33.5 (L) 03/07/2015   PLT 365 10/13/2016   PLT 332 03/07/2015   LYMPHOPCT 4  10/09/2016   MONOPCT 10 10/09/2016   EOSPCT 1 10/09/2016   BASOPCT 0 10/09/2016    CMP: Lab Results  Component Value Date   NA 131 (L) 10/13/2016   NA 139 10/17/2015   K 3.7 10/13/2016   CL 94 (L) 10/13/2016   CO2 28 10/13/2016   BUN 13 10/13/2016   BUN 16 10/17/2015   CREATININE 0.60 10/13/2016   CREATININE 0.86 06/08/2016   GLU 123 02/19/2015   PROT 7.0 10/09/2016  ALBUMIN 2.6 (L) 10/09/2016   BILITOT 0.5 10/09/2016   ALKPHOS 138 (H) 10/09/2016   AST 28 10/09/2016   ALT 16 10/09/2016      Total Time in preparing paper work, data evaluation and todays exam - 64 minutes  Lala Lund M.D on 10/13/2016 at 9:39 AM  [facility]   Office  [phone]

## 2016-10-13 NOTE — Care Management Important Message (Signed)
Important Message  Patient Details  Name: Tara Flores MRN: [id number] Date of Birth: [birth year]   Medicare Important Message Given:  Yes    Rether Rison 10/13/2016, 3:12 PM

## 2016-10-13 NOTE — Evaluation (Signed)
Physical Therapy Evaluation Patient Details Name: Tara Flores MRN: [id number] DOB: 02-Jan-1936 Today's Date: 10/13/2016   History of Present Illness  Pt is a 81 yo female with c/o of diabetic wound on bottom of L foot that was not healing, dx with osteomyletis, s/p L transmetatarsal amputation  Clinical Impression  Patient is s/p above surgery resulting in functional limitations due to the deficits listed below (see PT Problem List). Pt is minA for transfers and mod A for ambulation of 2 steps. Pt is currently limited by pain. Due to pt's visual deficits she will require help to don/doff her CAM boot correctly.  Patient will benefit from skilled PT to increase their independence and safety with mobility to allow discharge to the venue listed below.       Follow Up Recommendations SNF;Supervision/Assistance - 24 hour    Equipment Recommendations   (TBD at next venue)    Recommendations for Other Services OT consult     Precautions / Restrictions Precautions Precautions: Fall Required Braces or Orthoses: Other Brace/Splint (CAM Boot) Other Brace/Splint: CAM Boot Restrictions Weight Bearing Restrictions: No      Mobility  Bed Mobility               General bed mobility comments: pt in recliner at entry  Transfers Overall transfer level: Needs assistance Equipment used: Rolling walker (2 wheeled) Transfers: Sit to/from Stand Sit to Stand: Min assist;Mod assist         General transfer comment: pt sit>stand with modA for power up and steading at walker, pt stood for 30 sec before sitting back down, 2nd attempt pt sit>stand with minA and taking 2 steps, at which point pt started to sit back down vc for reaching back to chair and controlling descent into chair.  Ambulation/Gait Ambulation/Gait assistance: Mod assist Ambulation Distance (Feet): 1 Feet Assistive device: Rolling walker (2 wheeled) Gait Pattern/deviations: Step-to pattern;Decreased step length -  right;Decreased step length - left;Shuffle Gait velocity: slow  Gait velocity interpretation: Below normal speed for age/gender General Gait Details: pt only able to take 2 steps before refusing to go any further due to pain      Balance Overall balance assessment: Needs assistance Sitting-balance support: Single extremity supported;Feet supported Sitting balance-Leahy Scale: Fair Sitting balance - Comments: able to sit edge of chair with no LoB   Standing balance support: Bilateral upper extremity supported Standing balance-Leahy Scale: Zero                               Pertinent Vitals/Pain Pain Assessment: 0-10 Pain Score: 5  Pain Location: L foot Pain Descriptors / Indicators: Sore;Sharp;Spasm Pain Intervention(s): Limited activity within patient's tolerance;Monitored during session;Premedicated before session  VSS    Home Living Family/patient expects to be discharged to:: Skilled nursing facility                      Prior Function Level of Independence: Needs assistance   Gait / Transfers Assistance Needed: ambulated with RW  ADL's / Homemaking Assistance Needed: help with iADLs           Extremity/Trunk Assessment   Upper Extremity Assessment Upper Extremity Assessment: Overall WFL for tasks assessed    Lower Extremity Assessment Lower Extremity Assessment: LLE deficits/detail LLE Deficits / Details: L leg ROM and strength limited by pain LLE: Unable to fully assess due to pain LLE Sensation: history of peripheral neuropathy;decreased light touch  LLE Coordination: decreased fine motor    Cervical / Trunk Assessment Cervical / Trunk Assessment: Normal  Communication   Communication: No difficulties  Cognition Arousal/Alertness: Lethargic;Suspect due to medications Behavior During Therapy: Flat affect Overall Cognitive Status: Within Functional Limits for tasks assessed                                 General  Comments: pt has understanding of what has happened and plans for going to SNF      General Comments General comments (skin integrity, edema, etc.): Pt has visual deficits and needs help with donning/doffing CAM boot as she can not see how to use it. Daughter present in room and requested exercises to be done with her mother    Exercises General Exercises - Lower Extremity Ankle Circles/Pumps: AROM;Both;10 reps;Seated Quad Sets: AROM;Both;10 reps;Seated Heel Slides: AROM;Both;10 reps;Seated Straight Leg Raises: AROM;Both;10 reps;Seated   Assessment/Plan    PT Assessment Patient needs continued PT services  PT Problem List Decreased strength;Decreased range of motion;Decreased activity tolerance;Decreased balance;Decreased mobility;Decreased coordination;Decreased safety awareness;Pain       PT Treatment Interventions DME instruction;Gait training;Stair training;Functional mobility training;Therapeutic activities;Therapeutic exercise;Patient/family education    PT Goals (Current goals can be found in the Care Plan section)  Acute Rehab PT Goals Patient Stated Goal: be in less pain before moving around PT Goal Formulation: With patient/family Time For Goal Achievement: 10/27/16 Potential to Achieve Goals: Fair    Frequency Min 2X/week   Barriers to discharge Other (comment) pt unable to ambulate enough to get into her room       End of Session Equipment Utilized During Treatment: Gait belt;Other (comment) (CAM boot) Activity Tolerance: Patient limited by pain;Patient limited by lethargy Patient left: in chair;with chair alarm set;with call bell/phone within reach;with family/visitor present Nurse Communication: Mobility status PT Visit Diagnosis: Other abnormalities of gait and mobility (R26.89);Repeated falls (R29.6);Pain;Unsteadiness on feet (R26.81) Pain - Right/Left: Left Pain - part of body: Ankle and joints of foot    Time: 1111-1140 PT Time Calculation (min) (ACUTE  ONLY): 29 min   Charges:   PT Evaluation $PT Eval Low Complexity: 1 Procedure PT Treatments $Therapeutic Activity: 8-22 mins   PT G Codes:        Domonique Cothran B. Beverely Risen PT, DPT Acute Rehabilitation  [phone] Pager [phone]    Elon Alas Fleet 10/13/2016, 12:18 PM

## 2016-10-13 NOTE — Progress Notes (Signed)
Telemetry sitter discontinued per Dr Thedore Mins at 10:00 AM. Patient's daughter at bedside. Patient's chair alarm is on and In place while patient up in chair. Social work notified that Geographical information systems officer discontinued and that Dr Thedore Mins had placed discharge orders for patient.

## 2016-10-13 NOTE — Discharge Instructions (Signed)
Follow with Primary MD Janyth Contes, JESSICA K, NP in 7 days   Get CBC, CMP, 2 view Chest X ray checked  by Primary MD or SNF MD in 5-7 days ( we routinely change or add medications that can affect your baseline labs and fluid status, therefore we recommend that you get the mentioned basic workup next visit with your PCP, your PCP may decide not to get them or add new tests based on their clinical decision)  Activity: As tolerated with Full fall precautions use walker/cane & assistance as needed  Disposition SNF  Diet:   Diet Carb Modified   Heart Healthy   Accuchecks 4 times/day, Once in AM empty stomach and then before each meal. Log in all results and show them to your Prim.MD in 3 days. If any glucose reading is under 80 or above 300 call your Prim MD immidiately. Follow Low glucose instructions for glucose under 80 as instructed.   For Heart failure patients - Check your Weight same time everyday, if you gain over 2 pounds, or you develop in leg swelling, experience more shortness of breath or chest pain, call your Primary MD immediately. Follow Cardiac Low Salt Diet and 1.5 lit/day fluid restriction.  On your next visit with your primary care physician please Get Medicines reviewed and adjusted.  Please request your Prim.MD to go over all Hospital Tests and Procedure/Radiological results at the follow up, please get all Hospital records sent to your Prim MD by signing hospital release before you go home.  If you experience worsening of your admission symptoms, develop shortness of breath, life threatening emergency, suicidal or homicidal thoughts you must seek medical attention immediately by calling 911 or calling your MD immediately  if symptoms less severe.  You Must read complete instructions/literature along with all the possible adverse reactions/side effects for all the Medicines you take and that have been prescribed to you. Take any new Medicines after you have completely understood  and accpet all the possible adverse reactions/side effects.   Do not drive, operate heavy machinery, perform activities at heights, swimming or participation in water activities or provide baby sitting services if your were admitted for syncope or siezures until you have seen by Primary MD or a Neurologist and advised to do so again.  Do not drive when taking Pain medications.    Do not take more than prescribed Pain, Sleep and Anxiety Medications  Special Instructions: If you have smoked or chewed Tobacco  in the last 2 yrs please stop smoking, stop any regular Alcohol  and or any Recreational drug use.  Wear Seat belts while driving.   Please note  You were cared for by a hospitalist during your hospital stay. If you have any questions about your discharge medications or the care you received while you were in the hospital after you are discharged, you can call the unit and asked to speak with the hospitalist on call if the hospitalist that took care of you is not available. Once you are discharged, your primary care physician will handle any further medical issues. Please note that NO REFILLS for any discharge medications will be authorized once you are discharged, as it is imperative that you return to your primary care physician (or establish a relationship with a primary care physician if you do not have one) for your aftercare needs so that they can reassess your need for medications and monitor your lab values.

## 2016-10-14 ENCOUNTER — Other Ambulatory Visit: Payer: Self-pay

## 2016-10-14 ENCOUNTER — Other Ambulatory Visit: Payer: Self-pay | Admitting: *Deleted

## 2016-10-14 LAB — GLUCOSE, CAPILLARY
GLUCOSE-CAPILLARY: 207 mg/dL — AB (ref 65–99)
Glucose-Capillary: 283 mg/dL — ABNORMAL HIGH (ref 65–99)

## 2016-10-14 MED ORDER — HEPARIN SOD (PORK) LOCK FLUSH 100 UNIT/ML IV SOLN
250.0000 [IU] | INTRAVENOUS | Status: AC | PRN
  Administered 2016-10-14: 250 [IU]

## 2016-10-14 NOTE — Clinical Social Work Note (Signed)
Clinical Social Work Assessment  Patient Details  Name: Tara Flores MRN: [id number] Date of Birth: [birth year]  Date of referral:  10/13/16               Reason for consult:  Facility Placement                Permission sought to share information with:  Chartered certified accountant granted to share information::  Yes, Verbal Permission Granted  Name::     Tree surgeon::  SNF  Relationship::  daughter  Contact Information:     Housing/Transportation Living arrangements for the past 2 months:  Mobile Home Source of Information:  Adult Children, Patient Patient Interpreter Needed:  None Criminal Activity/Legal Involvement Pertinent to Current Situation/Hospitalization:  No - Comment as needed Significant Relationships:  Adult Children Lives with:  Self Do you feel safe going back to the place where you live?  No Need for family participation in patient care:  Yes (Comment)  Care giving concerns:  Patient resided at home alone prior to injury to foot.  Patient was able to manage all ADL's independently prior to hospitalization. Patient not safe to return home alone at this time.  Patient gave permission to speak with daughter Tara Flores.   Social Worker assessment / plan:  CSW met with patient to discuss recommendations for rehabilitation from PT.  With permission from pt, CSW contacted daughter to explain DC recommendations and plan for DC.   CSW explained SNF process and placement. Daughter in agreement with SNF placement and selected Tara Flores as a placement for pt.  CSW sent offer to [facility] and other [place] facilities.   Employment status:  Retired Forensic scientist:  Medicare PT Recommendations:  [place] / Referral to community resources:  [place]  Patient/Family's Response to care:  Daughter and patient pleased with assistance from [facility] for placement. They report no issues with care at this  time.  Patient/Family's Understanding of and Emotional Response to Diagnosis, Current Treatment, and Prognosis:  Daughter and patient understanding of diagnosis, current treatment, and prognosis.  They are aware of the need for further short term rehabilitation to strengthen patient's mobility and reduce risk for falls. They are hopeful that short term rehabilitation will be positive and return patient to baseline.  Emotional Assessment Appearance:  Appears stated age Attitude/Demeanor/Rapport:   (Cooperative) Affect (typically observed):  Accepting, Appropriate Orientation:  Oriented to Self, Oriented to Place, Oriented to  Time, Oriented to Situation Alcohol / Substance use:  Not Applicable Psych involvement (Current and /or in the community):  No (Comment)  Discharge Needs  Concerns to be addressed:  Care Coordination Readmission within the last 30 days:  No Current discharge risk:  Physical Impairment, Lives alone, Dependent with Mobility Barriers to Discharge:  No Barriers Identified   Tara Baxter, LCSW 10/14/2016, 10:58 AM

## 2016-10-14 NOTE — Consult Note (Signed)
   [facility] CM Inpatient Consult   10/14/2016  Tara Flores [birth year] [id number]   [facility] Care Management follow up. Chart reviewed. Noted Tara Flores will discharge to [facility] today. Will touch base with [facility] team.   [name], MSN-Ed, RN,BSN [facility] Liaison [phone]

## 2016-10-14 NOTE — Social Work (Signed)
Clinical Social Worker facilitated patient discharge including contacting patient family and facility to confirm patient discharge plans.  Clinical information faxed to facility and family agreeable with plan.  CSW arranged ambulance transport via [facility] to [facility].  RN to call [phone] report prior to discharge.  Clinical Social Worker will sign off for now as social work intervention is no longer needed. Please consult us again if new need arises.  Anyah Swallow, LCSW Clinical Social Worker [phone]    

## 2016-10-14 NOTE — Progress Notes (Signed)
[facility] PROGRESS NOTE  Patient: Tara Flores ZOX:[id number]   PCP: Sharon Seller, NP DOB: 1936/03/05   DOA: 10/09/2016   DOS: 10/14/2016    Subjective: Concerned regarding anxiety as well as smoking cessation  Objective:  Vitals:   10/13/16 2000 10/14/16 0440  BP: (!) 149/65 (!) 150/62  Pulse: 74 69  Resp: 18 18  Temp: 97.9 F (36.6 C) 98.2 F (36.8 C)    Bilateral lungs sound clear to auscultation. S1 and S2 present  Bowel sounds present  Assessment and plan: Left foot cellulitis, I asked be transmetatarsal application, antibiotics arranged. And he knew SNF discharge.  Type 2 diabetes mellitus, uncontrolled with hyperglycemia. Blood sugar remain elevated in the hospital, Lantus dose increased to 13 units and discharged twice a day. Continue sliding scale insulin and adjust as needed of the SNF.  Active smoker. Smoking cessation counseling provided. Nicotine patch continued.  Author: Lynden Oxford, MD [facility] Pager: [phone] 10/14/2016 11:05 AM   If 7PM-7AM, please contact night-coverage at [email or web address], password [facility]

## 2016-10-14 NOTE — Patient Outreach (Signed)
[facility]) Care Management  10/14/2016  CORAYMA CASHATT [birth year] [id number]   CSW spoke with patient's daughter who reports plans for her to go to [facility] SNF today for post hospitalization rehab.  CSW had long conversation with daughter about long term options/needs and daughter is pursuing alternative housing closer to her home in [place] for her mother at time of SNF d/c. CSW provided and encouraged her to pursue options so her mother does not return to her previous home she rents that is inadequate, unsafe and in need of cleaning and repair. Daughter agrees that her mom is not safe to return to that setting alone; she is considering ALF, rental with a housemate, etc.  CSW will plan f/u with patient at SNF in next  10-12 days.    Reece Levy, MSW, LCSW Clinical Social Worker  [facility] [employer] [phone]

## 2016-10-14 NOTE — Progress Notes (Signed)
RN called report to Alvis Lemmings, Charity fundraiser, at [facility] and she verbalized understanding of Rn report. Patient's PICC line in right upper arm was flushed and capped off by IV team RN. Patient's family is aware of patient's discharge. Patient assisted in getting dressed in her clothes that family brought her from home. Patient will leave with ortho shoe recommended by podiatry

## 2016-10-14 NOTE — Patient Outreach (Signed)
Care Coordination: Placed call to daughter Icelynn to follow up on patient.  Daughter reports that she is trying to come up with plan for patient.   I reviewed with daughter that [facility] social worker would notify me at discharged and then I would call her again.   PLAN: Care plan ended at this time. [facility] social worker to follow. Will await notification for discharge plan.  [name], RN, BSN, CEN [facility] [phone]

## 2016-10-14 NOTE — Progress Notes (Signed)
Inpatient Diabetes Program Recommendations  AACE/ADA: New Consensus Statement on Inpatient Glycemic Control (2015)  Target Ranges:  Prepandial:   less than 140 mg/dL      Peak postprandial:   less than 180 mg/dL (1-2 hours)      Critically ill patients:  140 - 180 mg/dL   Results for Tara Flores, Tara Flores (MRN [id number]) as of 10/14/2016 10:45  Ref. Range 10/13/2016 06:59 10/13/2016 11:56 10/13/2016 16:34 10/13/2016 21:33 10/14/2016 06:35  Glucose-Capillary Latest Ref Range: 65 - 99 mg/dL 147 (H) 829 (H) 562 (H) 225 (H) 283 (H)   Review of Glycemic Control  Diabetes history: DM 2 Outpatient Diabetes medications: NPH 25 units QAM, 12 units QPM Current orders for Inpatient glycemic control: Levemir 12 units BID, Novolog Moderate + HS scale  Inpatient Diabetes Program Recommendations: Glucose consistently in the 200's. Please consider increasing Levemir to 14 units BID.   Thanks,  Christena Deem RN, MSN, [facility] Inpatient Diabetes Coordinator Team Pager [phone] (8a-5p)

## 2016-10-14 NOTE — Care Management Note (Signed)
Case Management Note  Patient Details  Name: Tara Flores MRN: [id number] Date of Birth: 02-25-1936  Subjective/Objective:                    Action/Plan:  Patient to DC to SNF as facilitated by CSW Expected Discharge Date:  10/13/16               Expected Discharge Plan:  Skilled Nursing Facility  In-House Referral:  Clinical Social Work  Discharge planning Services  CM Consult  Post Acute Care Choice:    Choice offered to:     DME Arranged:    DME Agency:     HH Arranged:    HH Agency:     Status of Service:  Completed, signed off  If discussed at [employer], dates discussed:    Additional Comments:  Lawerance Sabal, RN 10/14/2016, 10:20 AM

## 2016-10-14 NOTE — Clinical Social Work Placement (Signed)
   CLINICAL SOCIAL WORK PLACEMENT  NOTE  Date:  10/14/2016  Patient Details  Name: Tara Flores MRN: [id number] Date of Birth: 09/04/1935  Clinical Social Work is seeking post-discharge placement for this patient at the Skilled  Nursing Facility level of care (*CSW will initial, date and re-position this form in  chart as items are completed):  Yes   Patient/family provided with [place] Clinical Social Work Department's list of facilities offering this level of care within the geographic area requested by the patient (or if unable, by the patient's family).  Yes   Patient/family informed of their freedom to choose among providers that offer the needed level of care, that participate in Medicare, Medicaid or managed care program needed by the patient, have an available bed and are willing to accept the patient.  Yes   Patient/family informed of [facility]'s ownership interest in [facility] and [facility], as well as of the fact that they are under no obligation to receive care at these facilities.  PASRR submitted to EDS on       PASRR number received on 10/13/16     Existing PASRR number confirmed on 10/13/16     FL2 transmitted to all facilities in geographic area requested by pt/family on 10/13/16     FL2 transmitted to all facilities within larger geographic area on 10/13/16     Patient informed that his/her managed care company has contracts with or will negotiate with certain facilities, including the following:        Yes   Patient/family informed of bed offers received.  Patient chooses bed at [facility]     Physician recommends and patient chooses bed at      Patient to be transferred to [facility] on 10/14/16.  Patient to be transferred to facility by [facility]     Patient family notified on 10/14/16 of transfer.  Name of family member notified:  Kathie Rhodes     PHYSICIAN Please prepare priority discharge summary, including  medications, Please sign DNR, Please prepare prescriptions, Please sign FL2     Additional Comment:    _______________________________________________ Tresa Moore, LCSW 10/14/2016, 11:07 AM

## 2016-10-14 NOTE — Progress Notes (Signed)
[facility] transport at bedside. RN provided them with transport report and they verbalized understanding.

## 2016-10-15 ENCOUNTER — Ambulatory Visit: Payer: Medicare Other

## 2016-10-15 LAB — CULTURE, BLOOD (ROUTINE X 2)
CULTURE: NO GROWTH
CULTURE: NO GROWTH
SPECIAL REQUESTS: ADEQUATE
Special Requests: ADEQUATE

## 2016-10-17 LAB — AEROBIC/ANAEROBIC CULTURE W GRAM STAIN (SURGICAL/DEEP WOUND)

## 2016-10-17 LAB — AEROBIC/ANAEROBIC CULTURE (SURGICAL/DEEP WOUND): CULTURE: NORMAL

## 2016-10-18 ENCOUNTER — Inpatient Hospital Stay: Admission: RE | Admit: 2016-10-18 | Payer: Medicare Other | Source: Ambulatory Visit

## 2016-10-19 ENCOUNTER — Encounter: Payer: Self-pay | Admitting: Podiatry

## 2016-10-19 ENCOUNTER — Ambulatory Visit: Payer: Medicare Other

## 2016-10-19 ENCOUNTER — Ambulatory Visit (INDEPENDENT_AMBULATORY_CARE_PROVIDER_SITE_OTHER): Payer: Medicare Other | Admitting: Podiatry

## 2016-10-19 DIAGNOSIS — Z9889 Other specified postprocedural states: Secondary | ICD-10-CM

## 2016-10-19 NOTE — Progress Notes (Signed)
Subjective: Patient presents today status post left midfoot amputation. Date of surgery 10/12/2016. Patient states that she's doing very well. Patient is currently at [facility] rehabilitation facility.  Objective: Skin incisions are well coapted with staples intact. There is some moderate drainage noted within the incision site with peri-incisional maceration. No malodor or sign of infectious process. Negative for cellulitis.  Assessment: Status post left midfoot amputation. Date of surgery 10/12/2016.  Plan of care: Today dressings were changed. Orders for dressing changes at the rehabilitation facility were provided. Dressing changes 3 times per week consisting of Betadine and dry sterile dressing. Return to clinic in 2 weeks  Tara Flores, DPM [facility]  Dr. Felecia Flores, DPM    [address]                                        [place], [place] [address]                Office [phone]  Fax [phone]

## 2016-10-20 ENCOUNTER — Other Ambulatory Visit: Payer: Self-pay | Admitting: *Deleted

## 2016-10-20 NOTE — Patient Outreach (Signed)
[facility]) Care Management  10/20/2016  Tara Flores 1935-12-18 [id number]   CSW made an attempt to try and contact the social worker at [facility] to get an update on patient's discharge disposition; however, no one was available to take CSW's call.  A HIPAA compliant message was left on voicemail.  CSW is currently awaiting a return call. Danford Bad, BSW, MSW, LCSW  Licensed Restaurant manager, fast food [facility]  Mailing [place] [address] [place], [place] [address] Physical Address-[address], [place], [place] [address] Toll Free Main # [phone] Fax # [phone] Cell # [phone]  Office # [phone] [email or web address]

## 2016-10-21 ENCOUNTER — Encounter (HOSPITAL_BASED_OUTPATIENT_CLINIC_OR_DEPARTMENT_OTHER): Payer: Medicare Other

## 2016-10-23 ENCOUNTER — Other Ambulatory Visit: Payer: Self-pay | Admitting: *Deleted

## 2016-10-23 NOTE — Patient Outreach (Signed)
[facility] Care Management  10/23/2016  Sherle PoeBetty L Flores 07/27/1935 [id number]   CSW spoke with patient's daughter by phone who reports her mom is still in rehab/SNF. No dc date has been set and not disposition has been confirmed.  Per daughter, patient appears to be depressed and "[facility]".  CSW encouraged daughter to continue to seek alternative housing options. CSW also encouraged daughter to ask SNF MD to consider anti-depressant. CSW awaits call from SNF rep-  Will plan f/u call Monday.      Reece LevyJanet Wrangler Flores, MSW, LCSW Clinical Social Worker  [facility] [phone]

## 2016-10-26 ENCOUNTER — Ambulatory Visit: Payer: Self-pay | Admitting: *Deleted

## 2016-10-27 ENCOUNTER — Ambulatory Visit: Payer: Self-pay | Admitting: *Deleted

## 2016-10-27 ENCOUNTER — Telehealth: Payer: Self-pay | Admitting: *Deleted

## 2016-10-27 NOTE — Telephone Encounter (Addendum)
Pt's dtr, Tara Flores states pt's insurance will only pay for 20 days of in-facility nursing care, and Tara Flores had wanted pt to have PICC line antibiotics for 6 weeks. Tara Flores states there is not one at pt's home that can administer the antibiotic through the PICC line. Tara Flores asked that Tara Flores perform a Peer to Peer to increase pt's [facility] at [facility]. I spoke with Tara Flores and asked if pt lived at home and if dtr would be okay with home health nursing for pt for wound care and PICC line care and antibiotic administering. Tara Flores stated that would be fine. 10/28/2016-I informed pt's dtr, Tara Flores that Tara Flores stated Home Health Care would be good for the PICC line and antibiotic and wound care. I told Tara Flores to discuss with [facility] psychotherap[facility]ocial Worker [facility] and they would direct as how to get started on achieving discharge date and information on HHC.11/03/2016-[facility]ara Flores [facility] states pt's dtr, Tara Flores wants pt's IV antibiotics changed, but Tara Flores did not begin the IV antibiotics Tara Flores did.11/04/2016-Tara Flores said if there is no growth in wound culture of collected 10/12/2016, may D/C IV antibiotics and begin Bactrim DS #56 one tablet bid. Left message for pt's dtr, Tara Flores to call concerning pt. Pt's dtr, Tara Flores returned my call.11/05/2016-Left message for Dtr to call with pt's location and discharge date, and it had information concerning pt's orders.I spoke with Tara Flores, dtr, she states pt is still at [facility], and had been admitted there from the hospital. [facility] to d/c PICC, and begin Bactrim DS one tablet bid for 4 weeks and to follow up with Tara Flores when discharged.

## 2016-10-28 ENCOUNTER — Other Ambulatory Visit: Payer: Self-pay | Admitting: *Deleted

## 2016-10-29 NOTE — Patient Outreach (Signed)
[facility]) Care Management  10/29/2016  Sherle PoeBetty L Flores 01/03/1936 [id number]   CSW spoke with patient's daughter who reports they are working on some housing options that may involve having a roommate and new housing. [facility] where they plan for her to stay for the duration of the 6 weeks IV ABX.   CSW will plan f/u call for SNF and dc planning update in thenext 2 weeks.        Reece LevyJanet Porfiria Heinrich, MSW, LCSW Clinical Social Worker  [facility] [phone]

## 2016-11-01 ENCOUNTER — Other Ambulatory Visit: Payer: Self-pay | Admitting: Nurse Practitioner

## 2016-11-02 ENCOUNTER — Ambulatory Visit (INDEPENDENT_AMBULATORY_CARE_PROVIDER_SITE_OTHER): Payer: Self-pay | Admitting: Podiatry

## 2016-11-02 ENCOUNTER — Ambulatory Visit: Payer: Medicare Other | Admitting: Podiatry

## 2016-11-02 DIAGNOSIS — Z9889 Other specified postprocedural states: Secondary | ICD-10-CM

## 2016-11-02 DIAGNOSIS — Z89432 Acquired absence of left foot: Secondary | ICD-10-CM

## 2016-11-02 NOTE — Progress Notes (Signed)
Subjective: Patient presents today status post left midfoot amputation. Date of surgery 10/12/2016. Patient states that she's doing very well. [facility]. Patient states that she is concerned that she will be discharged soon and she would like to discontinue IV antibiotics and go to oral abx upon discharge. Patient is currently managed by infectious disease for IV antibiotics.  Objective: Staples appear intact. There is some drainage noted to the central anterior portion of the incision amputation site. Peri-incisional maceration noted. At the moment there is no significant amount of dehiscence noted. Amputation stump appears mildly erythematous  Assessment: Status post left midfoot amputation. Date of surgery 10/12/2016.  Plan of care: Today dressings were changed. Continue dressing changes 3 times per week. Recommend follow-up with infectious disease. Return to clinic in 2 weeks  Tara Flores, DPM [facility]  Dr. Felecia ShellingBrent M. Flores, DPM    [address]                                        [place], [place] [address]                Office [phone]  Fax [phone]

## 2016-11-03 ENCOUNTER — Telehealth: Payer: Self-pay | Admitting: *Deleted

## 2016-11-03 NOTE — Telephone Encounter (Signed)
Patient's daughter Beryle BeamsBetty Senor called asking if patient's IV antibiotics can be changed. She is currently being charged a daily rate [facility] as her insurance has run out. Patient's daughter is asking for IV antibiotics once daily, if not oral antibiotics.   Daughter states she called Dr Logan BoresEvans first, but was instructed to call [facility].   Patient was not consulted by ID while hospitalized.  There are no notes from Dr Ninetta LightsHatcher, patient is not managed by ID, there is no follow up scheduled with us. RN left message with Dr Michel HarrowEvan's office with this information, gave ID On Call's pager (Dr Luciana Axeomer) if he wanted a consult. Andree CossHowell, Jamisen Duerson M, RN

## 2016-11-03 NOTE — Telephone Encounter (Signed)
That's really up to infectious disease managing her IV antibiotics.  Dr. Logan BoresEvans

## 2016-11-05 ENCOUNTER — Other Ambulatory Visit: Payer: Self-pay | Admitting: Pharmacist

## 2016-11-05 ENCOUNTER — Other Ambulatory Visit: Payer: Self-pay

## 2016-11-05 DIAGNOSIS — L97529 Non-pressure chronic ulcer of other part of left foot with unspecified severity: Principal | ICD-10-CM

## 2016-11-05 DIAGNOSIS — E13621 Other specified diabetes mellitus with foot ulcer: Secondary | ICD-10-CM

## 2016-11-05 NOTE — Patient Outreach (Signed)
[facility]) Care Management  11/05/2016  Sherle PoeBetty L Fluellen 01/14/1936 [id number]  Per review of chart, patient has been in SNF >10 days.  Discussed case with [facility] yesterday, and patient appears to still be in SNF.    [facility] by [facility] for concerns with patient drawing up insulin syringes.  Had discussed [facility] with patient, but she declined due to program having an income based fee associated with it.    Plan:  Discussed with [facility], as patient remains at SNF, will close pharmacy case.  [facility] be [facility] if needed on discharge from SNF.   Tommye StandardKevin Denilson Salminen, PharmD, [facility] Clinical Pharmacist [facility] [phone]

## 2016-11-06 ENCOUNTER — Encounter: Payer: Self-pay | Admitting: Vascular Surgery

## 2016-11-08 ENCOUNTER — Other Ambulatory Visit: Payer: Self-pay | Admitting: Internal Medicine

## 2016-11-09 ENCOUNTER — Telehealth: Payer: Self-pay | Admitting: *Deleted

## 2016-11-09 ENCOUNTER — Other Ambulatory Visit: Payer: Self-pay | Admitting: *Deleted

## 2016-11-09 NOTE — Telephone Encounter (Signed)
Patient daughter called and left message and requested a FL2 form to be filled out for patient. Stated that the patient needs more care than expected and she was just discharged from [facility]. Daughter is trying to get [facility] [place] and needs FL2 Form.   I returned call and had to leave message. Instructed them to call the office and schedule an appointment since patient was released from [facility].

## 2016-11-09 NOTE — Telephone Encounter (Signed)
Gave verbal order for physical therapy for PT twice weekly for 8 weeks, per Abbey ChattersJessica Eubanks, ok

## 2016-11-09 NOTE — Patient Outreach (Signed)
[facility]) Care Management  11/09/2016  Sherle PoeBetty L Flores 08/31/1935 [id number]   CSW spoke with patient's daughter by phone today who reports she was released on Saturday from SNF and moved into a rental property with another individual who has since been hospitalized. Per daughter, they may want/need to consider other options including placement in a LTC setting. CSW encouraged daughter to discuss with her mother, apply for Medicaid and plans for CSW to f/u later in week.    [name], MSW, LCSW Clinical Social Worker  [facility] [phone]

## 2016-11-11 ENCOUNTER — Other Ambulatory Visit: Payer: Self-pay | Admitting: *Deleted

## 2016-11-11 MED ORDER — GLUCOSE BLOOD VI STRP: ORAL_STRIP | 3 refills | Status: DC

## 2016-11-11 MED ORDER — ONETOUCH VERIO W/DEVICE KIT: 1.0000 | PACK | Freq: Three times a day (TID) | 0 refills | Status: DC

## 2016-11-11 NOTE — Patient Outreach (Signed)
[facility]) Care Management  [place]  11/11/2016   Tara Flores 11-07-1935 [id number]  Subjective: "Looking for housing options"  Objective: CSW to assist patient with commuity based resources to aide in her well-being, quality of life and overall safety/needs.    Encounter Medications:  Outpatient Encounter Prescriptions as of 11/11/2016  Medication Sig  . ACCU-CHEK FASTCLIX LANCETS MISC Check blood sugar 2-3 times daily DX E11.9  . acetaminophen (TYLENOL) 325 MG tablet Take 2 tablets (650 mg total) by mouth every 6 (six) hours as needed for mild pain (or Fever >/= 101).  Marland Kitchen albuterol (PROVENTIL HFA;VENTOLIN HFA) 108 (90 Base) MCG/ACT inhaler Inhale 1 puff into the lungs every 6 (six) hours as needed for wheezing or shortness of breath. Reported on 09/06/2015  . albuterol (PROVENTIL) (2.5 MG/3ML) 0.083% nebulizer solution USE 1 VIAL VIA NEBULIZER  EVERY 6 HOURS AS NEEDED FOR WHEEZING OR SHORTNESS OF  BREATH.  Marland Kitchen aspirin 81 MG tablet Take 81 mg by mouth daily.  . Blood Glucose Monitoring Suppl (ONETOUCH VERIO) w/Device KIT 1 kit by Does not apply route 3 (three) times daily. Use to test blood sugar three times daily. Dx:E11.9  . carvedilol (COREG) 6.25 MG tablet Take 1 tablet (6.25 mg total) by mouth 2 (two) times daily with a meal.  . ceFAZolin (ANCEF) 2-4 GM/100ML-% IVPB Inject 100 mLs (2 g total) into the vein every 8 (eight) hours.  Marland Kitchen glucose blood (ONETOUCH VERIO) test strip Use to test blood sugar three times daily. Dx E11.9  . insulin aspart (NOVOLOG) 100 UNIT/ML injection Before each meal 3 times a day, 140-199 - 2 units, 200-250 - 4 units, 251-299 - 6 units,  300-349 - 8 units,  350 or above 10 units. Insulin PEN if approved, provide syringes and needles if needed.  . insulin glargine (LANTUS) 100 UNIT/ML injection Inject 0.15 mLs (15 Units total) into the skin 2 (two) times daily.  Marland Kitchen levothyroxine (SYNTHROID, LEVOTHROID) 150 MCG tablet Take 150 mcg by  mouth daily before breakfast.  . lisinopril (PRINIVIL,ZESTRIL) 10 MG tablet Take 1 tablet (10 mg total) by mouth daily.  . nicotine (NICODERM CQ - DOSED IN MG/24 HOURS) 21 mg/24hr patch Place 1 patch (21 mg total) onto the skin daily.  . nitroGLYCERIN (NITROSTAT) 0.4 MG SL tablet Place 1 tablet (0.4 mg total) under the tongue every 5 (five) minutes as needed for chest pain.  Marland Kitchen oxyCODONE-acetaminophen (PERCOCET/ROXICET) 5-325 MG tablet Take 1 tablet by mouth every 6 (six) hours as needed for severe pain.   No facility-administered encounter medications on file as of 11/11/2016.     Functional Status:  In your present state of health, do you have any difficulty performing the following activities: 10/10/2016 10/01/2016  Hearing? N Y  Vision? Y Y  Difficulty concentrating or making decisions? N N  Walking or climbing stairs? Y Y  Dressing or bathing? N N  Doing errands, shopping? N Y  Conservation officer, nature and eating ? - Y  Using the Toilet? - N  In the past six months, have you accidently leaked urine? - N  Do you have problems with loss of bowel control? - N  Managing your Medications? - Y  Managing your Finances? - Y  Housekeeping or managing your Housekeeping? - Y  Some recent data might be hidden    Fall/Depression Screening: Fall Risk  10/05/2016 10/01/2016 10/01/2016  Falls in the past year? Yes Yes Yes  Number falls in past yr: 2 or  more 2 or more -  Injury with Fall? Yes No No  Risk Factor Category  - High Fall Risk -  Risk for fall due to : - History of fall(s) Impaired vision;History of fall(s);Impaired balance/gait;Impaired mobility  Follow up - Falls prevention discussed -   PHQ 2/9 Scores 10/01/2016 09/17/2016 09/17/2015 09/08/2015 09/06/2015 08/20/2015  PHQ - 2 Score 0 0 0 0 0 0    Assessment:  CSW spoke with daughter who reports they are looking at stable, housing options for her as they are not sure the person she has moved in with will be a long term/stable option. Daughter  reports she is applying for Medicaid and that Tara Flores PT has started and feels she has potential to progress and not need long term placement.  Plan:  Daughter plans to continue seeking housing options for her mother; a granddaughter is staying/visiting in the home and assisting some currently which has been a help.  CSW will seek options to aide in housing and other potential services and f/u next week.     Tara Flores, MSW, LCSW Clinical Social Worker  [place], MSW, [place] Worker  [place] [phone]

## 2016-11-11 NOTE — Telephone Encounter (Signed)
[facility] 

## 2016-11-12 ENCOUNTER — Inpatient Hospital Stay (HOSPITAL_COMMUNITY)
Admission: RE | Admit: 2016-11-12 | Discharge: 2016-11-12 | Disposition: A | Discharge status: Home / Self Care | Payer: Medicare Other | Source: Ambulatory Visit

## 2016-11-12 ENCOUNTER — Encounter: Payer: Medicare Other | Admitting: Vascular Surgery

## 2016-11-12 DIAGNOSIS — L97529 Non-pressure chronic ulcer of other part of left foot with unspecified severity: Principal | ICD-10-CM

## 2016-11-12 DIAGNOSIS — E13621 Other specified diabetes mellitus with foot ulcer: Secondary | ICD-10-CM

## 2016-11-13 ENCOUNTER — Other Ambulatory Visit: Payer: Self-pay | Admitting: *Deleted

## 2016-11-17 ENCOUNTER — Ambulatory Visit: Payer: Medicare Other | Admitting: Nurse Practitioner

## 2016-11-18 ENCOUNTER — Ambulatory Visit (INDEPENDENT_AMBULATORY_CARE_PROVIDER_SITE_OTHER): Payer: Medicare Other | Admitting: Podiatry

## 2016-11-18 ENCOUNTER — Other Ambulatory Visit: Payer: Self-pay | Admitting: *Deleted

## 2016-11-18 ENCOUNTER — Ambulatory Visit (INDEPENDENT_AMBULATORY_CARE_PROVIDER_SITE_OTHER): Payer: Medicare Other

## 2016-11-18 ENCOUNTER — Encounter: Payer: Self-pay | Admitting: Podiatry

## 2016-11-18 DIAGNOSIS — M86679 Other chronic osteomyelitis, unspecified ankle and foot: Secondary | ICD-10-CM | POA: Diagnosis not present

## 2016-11-18 DIAGNOSIS — Z9889 Other specified postprocedural states: Secondary | ICD-10-CM

## 2016-11-18 NOTE — Patient Outreach (Signed)
[facility]) Care Management  11/18/2016  Tara PoeBetty L Flores 11/18/1935 [id number]   CSW spoke with patient's daughter who is continuing to consider/pursue long term options for her mom. She has changed her mother's PCP to a Physician who does home visits. She plans to apply for Medicaid and schedule PCP visit to complete FL2. She hopes to find alternative housing options for her mom soon as she is feeling overwhelmed and in need of help.  CSW will plan f/u call next week.        Reece LevyJanet Aadarsh Cozort, MSW, LCSW Clinical Social Worker  [facility] [phone]

## 2016-11-19 ENCOUNTER — Encounter (HOSPITAL_COMMUNITY): Payer: Self-pay | Admitting: Radiology

## 2016-11-19 ENCOUNTER — Inpatient Hospital Stay (HOSPITAL_COMMUNITY): Payer: Medicare Other

## 2016-11-19 ENCOUNTER — Emergency Department (HOSPITAL_COMMUNITY): Payer: Medicare Other

## 2016-11-19 ENCOUNTER — Inpatient Hospital Stay (HOSPITAL_COMMUNITY)
Admission: EM | Admit: 2016-11-19 | Discharge: 2016-11-23 | DRG: 069 | Disposition: A | Discharge status: Home Health | Payer: Medicare Other | Attending: Family Medicine | Admitting: Family Medicine

## 2016-11-19 ENCOUNTER — Telehealth: Payer: Self-pay

## 2016-11-19 DIAGNOSIS — D631 Anemia in chronic kidney disease: Secondary | ICD-10-CM | POA: Diagnosis present

## 2016-11-19 DIAGNOSIS — R4701 Aphasia: Secondary | ICD-10-CM | POA: Diagnosis present

## 2016-11-19 DIAGNOSIS — F17210 Nicotine dependence, cigarettes, uncomplicated: Secondary | ICD-10-CM | POA: Diagnosis present

## 2016-11-19 DIAGNOSIS — J449 Chronic obstructive pulmonary disease, unspecified: Secondary | ICD-10-CM | POA: Diagnosis present

## 2016-11-19 DIAGNOSIS — I481 Persistent atrial fibrillation: Secondary | ICD-10-CM | POA: Diagnosis present

## 2016-11-19 DIAGNOSIS — G459 Transient cerebral ischemic attack, unspecified: Secondary | ICD-10-CM | POA: Diagnosis present

## 2016-11-19 DIAGNOSIS — Z8249 Family history of ischemic heart disease and other diseases of the circulatory system: Secondary | ICD-10-CM

## 2016-11-19 DIAGNOSIS — I878 Other specified disorders of veins: Secondary | ICD-10-CM | POA: Diagnosis present

## 2016-11-19 DIAGNOSIS — E785 Hyperlipidemia, unspecified: Secondary | ICD-10-CM | POA: Diagnosis present

## 2016-11-19 DIAGNOSIS — E1169 Type 2 diabetes mellitus with other specified complication: Secondary | ICD-10-CM | POA: Diagnosis present

## 2016-11-19 DIAGNOSIS — R911 Solitary pulmonary nodule: Secondary | ICD-10-CM | POA: Diagnosis present

## 2016-11-19 DIAGNOSIS — H548 Legal blindness, as defined in USA: Secondary | ICD-10-CM | POA: Diagnosis present

## 2016-11-19 DIAGNOSIS — J441 Chronic obstructive pulmonary disease with (acute) exacerbation: Secondary | ICD-10-CM | POA: Diagnosis not present

## 2016-11-19 DIAGNOSIS — I4891 Unspecified atrial fibrillation: Secondary | ICD-10-CM | POA: Diagnosis not present

## 2016-11-19 DIAGNOSIS — I2510 Atherosclerotic heart disease of native coronary artery without angina pectoris: Secondary | ICD-10-CM | POA: Diagnosis present

## 2016-11-19 DIAGNOSIS — N189 Chronic kidney disease, unspecified: Secondary | ICD-10-CM | POA: Diagnosis present

## 2016-11-19 DIAGNOSIS — I959 Hypotension, unspecified: Secondary | ICD-10-CM | POA: Diagnosis present

## 2016-11-19 DIAGNOSIS — E1165 Type 2 diabetes mellitus with hyperglycemia: Secondary | ICD-10-CM | POA: Diagnosis present

## 2016-11-19 DIAGNOSIS — I129 Hypertensive chronic kidney disease with stage 1 through stage 4 chronic kidney disease, or unspecified chronic kidney disease: Secondary | ICD-10-CM | POA: Diagnosis present

## 2016-11-19 DIAGNOSIS — Z833 Family history of diabetes mellitus: Secondary | ICD-10-CM

## 2016-11-19 DIAGNOSIS — Z5320 Procedure and treatment not carried out because of patient's decision for unspecified reasons: Secondary | ICD-10-CM | POA: Diagnosis present

## 2016-11-19 DIAGNOSIS — Z7982 Long term (current) use of aspirin: Secondary | ICD-10-CM

## 2016-11-19 DIAGNOSIS — E1142 Type 2 diabetes mellitus with diabetic polyneuropathy: Secondary | ICD-10-CM | POA: Diagnosis present

## 2016-11-19 DIAGNOSIS — E8770 Fluid overload, unspecified: Secondary | ICD-10-CM | POA: Diagnosis present

## 2016-11-19 DIAGNOSIS — E871 Hypo-osmolality and hyponatremia: Secondary | ICD-10-CM | POA: Diagnosis present

## 2016-11-19 DIAGNOSIS — I2584 Coronary atherosclerosis due to calcified coronary lesion: Secondary | ICD-10-CM | POA: Diagnosis present

## 2016-11-19 DIAGNOSIS — Z7901 Long term (current) use of anticoagulants: Secondary | ICD-10-CM

## 2016-11-19 DIAGNOSIS — E1151 Type 2 diabetes mellitus with diabetic peripheral angiopathy without gangrene: Secondary | ICD-10-CM | POA: Diagnosis present

## 2016-11-19 DIAGNOSIS — Z881 Allergy status to other antibiotic agents status: Secondary | ICD-10-CM

## 2016-11-19 DIAGNOSIS — Z66 Do not resuscitate: Secondary | ICD-10-CM | POA: Diagnosis present

## 2016-11-19 DIAGNOSIS — Z7189 Other specified counseling: Secondary | ICD-10-CM

## 2016-11-19 DIAGNOSIS — Z89432 Acquired absence of left foot: Secondary | ICD-10-CM

## 2016-11-19 DIAGNOSIS — E039 Hypothyroidism, unspecified: Secondary | ICD-10-CM | POA: Diagnosis present

## 2016-11-19 DIAGNOSIS — Z888 Allergy status to other drugs, medicaments and biological substances status: Secondary | ICD-10-CM

## 2016-11-19 DIAGNOSIS — F329 Major depressive disorder, single episode, unspecified: Secondary | ICD-10-CM | POA: Diagnosis present

## 2016-11-19 DIAGNOSIS — I480 Paroxysmal atrial fibrillation: Secondary | ICD-10-CM

## 2016-11-19 DIAGNOSIS — I248 Other forms of acute ischemic heart disease: Secondary | ICD-10-CM | POA: Diagnosis present

## 2016-11-19 DIAGNOSIS — I2729 Other secondary pulmonary hypertension: Secondary | ICD-10-CM | POA: Diagnosis present

## 2016-11-19 DIAGNOSIS — R4781 Slurred speech: Secondary | ICD-10-CM | POA: Diagnosis present

## 2016-11-19 DIAGNOSIS — F40240 Claustrophobia: Secondary | ICD-10-CM | POA: Diagnosis present

## 2016-11-19 DIAGNOSIS — Z794 Long term (current) use of insulin: Secondary | ICD-10-CM

## 2016-11-19 DIAGNOSIS — E1122 Type 2 diabetes mellitus with diabetic chronic kidney disease: Secondary | ICD-10-CM | POA: Diagnosis present

## 2016-11-19 DIAGNOSIS — Z515 Encounter for palliative care: Secondary | ICD-10-CM

## 2016-11-19 DIAGNOSIS — Z79899 Other long term (current) drug therapy: Secondary | ICD-10-CM

## 2016-11-19 LAB — COMPREHENSIVE METABOLIC PANEL
ALK PHOS: 120 U/L (ref 38–126)
ALT: 10 U/L — ABNORMAL LOW (ref 14–54)
ANION GAP: 10 (ref 5–15)
AST: 17 U/L (ref 15–41)
Albumin: 2.8 g/dL — ABNORMAL LOW (ref 3.5–5.0)
BILIRUBIN TOTAL: 0.8 mg/dL (ref 0.3–1.2)
BUN: 21 mg/dL — ABNORMAL HIGH (ref 6–20)
CALCIUM: 8.6 mg/dL — AB (ref 8.9–10.3)
CO2: 22 mmol/L (ref 22–32)
Chloride: 97 mmol/L — ABNORMAL LOW (ref 101–111)
Creatinine, Ser: 0.71 mg/dL (ref 0.44–1.00)
GFR calc non Af Amer: >60 mL/min (ref >60)
Glucose, Bld: 89 mg/dL (ref 65–99)
Potassium: 4.5 mmol/L (ref 3.5–5.1)
Sodium: 129 mmol/L — ABNORMAL LOW (ref 135–145)
TOTAL PROTEIN: 7.3 g/dL (ref 6.5–8.1)

## 2016-11-19 LAB — CBC WITH DIFFERENTIAL/PLATELET
BASOS PCT: 1 %
Basophils Absolute: 0.1 10*3/uL (ref 0.0–0.1)
Eosinophils Absolute: 0.6 10*3/uL (ref 0.0–0.7)
Eosinophils Relative: 8 %
HEMATOCRIT: 34.4 % — AB (ref 36.0–46.0)
Hemoglobin: 11.3 g/dL — ABNORMAL LOW (ref 12.0–15.0)
Lymphocytes Relative: 8 %
Lymphs Abs: 0.6 10*3/uL — ABNORMAL LOW (ref 0.7–4.0)
MCH: 30.4 pg (ref 26.0–34.0)
MCHC: 32.8 g/dL (ref 30.0–36.0)
MCV: 92.5 fL (ref 78.0–100.0)
MONO ABS: 0.6 10*3/uL (ref 0.1–1.0)
Monocytes Relative: 8 %
NEUTROS ABS: 6.1 10*3/uL (ref 1.7–7.7)
NEUTROS PCT: 75 %
Platelets: 351 10*3/uL (ref 150–400)
RBC: 3.72 MIL/uL — ABNORMAL LOW (ref 3.87–5.11)
RDW: 14.1 % (ref 11.5–15.5)
WBC: 8.1 10*3/uL (ref 4.0–10.5)

## 2016-11-19 LAB — URINALYSIS, ROUTINE W REFLEX MICROSCOPIC
BACTERIA UA: NONE SEEN
Bilirubin Urine: NEGATIVE
GLUCOSE, UA: 150 mg/dL — AB
Hgb urine dipstick: NEGATIVE
Ketones, ur: NEGATIVE mg/dL
Leukocytes, UA: NEGATIVE
Nitrite: NEGATIVE
PROTEIN: 100 mg/dL — AB
SQUAMOUS EPITHELIAL / LPF: NONE SEEN
Specific Gravity, Urine: 1.024 (ref 1.005–1.030)
pH: 5 (ref 5.0–8.0)

## 2016-11-19 LAB — VITAMIN B12: VITAMIN B 12: 200 pg/mL (ref 180–914)

## 2016-11-19 LAB — BRAIN NATRIURETIC PEPTIDE: B Natriuretic Peptide: 464.5 pg/mL — ABNORMAL HIGH (ref 0.0–100.0)

## 2016-11-19 LAB — ETHANOL

## 2016-11-19 LAB — I-STAT TROPONIN, ED: Troponin i, poc: 0.14 ng/mL (ref 0.00–0.08)

## 2016-11-19 LAB — TROPONIN I: TROPONIN I: 0.36 ng/mL — AB (ref <0.03)

## 2016-11-19 LAB — TSH
TSH: 0.961 u[IU]/mL (ref 0.350–4.500)
TSH: 0.926 u[IU]/mL (ref 0.350–4.500)

## 2016-11-19 LAB — I-STAT CG4 LACTIC ACID, ED
LACTIC ACID, VENOUS: 1.72 mmol/L (ref 0.5–1.9)
LACTIC ACID, VENOUS: 1.07 mmol/L (ref 0.5–1.9)

## 2016-11-19 LAB — PROTIME-INR
INR: 0.99
Prothrombin Time: 13.1 seconds (ref 11.4–15.2)

## 2016-11-19 MED ORDER — NICOTINE 21 MG/24HR TD PT24: 21.0000 mg | MEDICATED_PATCH | Freq: Every day | TRANSDERMAL | Status: DC

## 2016-11-19 MED ORDER — MIDAZOLAM HCL 2 MG/2ML IJ SOLN
5.0000 mg | Freq: Once | INTRAMUSCULAR | Status: AC
  Administered 2016-11-19: 5 mg via INTRAVENOUS
  Filled 2016-11-19: qty 6

## 2016-11-19 MED ORDER — INSULIN ASPART 100 UNIT/ML ~~LOC~~ SOLN: 0.0000 [IU] | Freq: Three times a day (TID) | SUBCUTANEOUS | Status: DC

## 2016-11-19 MED ORDER — LEVOTHYROXINE SODIUM 75 MCG PO TABS
150.0000 ug | ORAL_TABLET | Freq: Every day | ORAL | Status: DC
Start: 2016-11-20 — End: 2016-11-23
  Administered 2016-11-20 – 2016-11-23 (×4): 150 ug via ORAL
  Filled 2016-11-19: qty 1
  Filled 2016-11-19 (×5): qty 2

## 2016-11-19 MED ORDER — IOPAMIDOL (ISOVUE-370) INJECTION 76%
INTRAVENOUS | Status: AC
  Administered 2016-11-19: 50 mL
  Filled 2016-11-19: qty 50

## 2016-11-19 MED ORDER — HEPARIN (PORCINE) IN NACL 100-0.45 UNIT/ML-% IJ SOLN
900.0000 [IU]/h | INTRAMUSCULAR | Status: DC
  Administered 2016-11-19: 900 [IU]/h via INTRAVENOUS
  Filled 2016-11-19: qty 250

## 2016-11-19 MED ORDER — DILTIAZEM HCL 30 MG PO TABS
30.0000 mg | ORAL_TABLET | Freq: Four times a day (QID) | ORAL | Status: DC
  Administered 2016-11-19 – 2016-11-21 (×8): 30 mg via ORAL
  Filled 2016-11-19 (×10): qty 1

## 2016-11-19 MED ORDER — HEPARIN BOLUS VIA INFUSION
4000.0000 [IU] | Freq: Once | INTRAVENOUS | Status: AC
  Administered 2016-11-19: 4000 [IU] via INTRAVENOUS
  Filled 2016-11-19: qty 4000

## 2016-11-19 MED ORDER — CARVEDILOL 6.25 MG PO TABS: 6.2500 mg | ORAL_TABLET | Freq: Two times a day (BID) | ORAL | Status: DC

## 2016-11-19 MED ORDER — CEFAZOLIN SODIUM-DEXTROSE 2-4 GM/100ML-% IV SOLN
2.0000 g | Freq: Three times a day (TID) | INTRAVENOUS | Status: DC
  Administered 2016-11-19 – 2016-11-20 (×2): 2 g via INTRAVENOUS
  Filled 2016-11-19 (×3): qty 100

## 2016-11-19 MED ORDER — FUROSEMIDE 10 MG/ML IJ SOLN
20.0000 mg | Freq: Once | INTRAMUSCULAR | Status: AC
  Administered 2016-11-19: 20 mg via INTRAVENOUS
  Filled 2016-11-19: qty 4

## 2016-11-19 MED ORDER — SODIUM CHLORIDE 0.9% FLUSH
3.0000 mL | Freq: Two times a day (BID) | INTRAVENOUS | Status: DC
Start: 2016-11-19 — End: 2016-11-23
  Administered 2016-11-19 – 2016-11-23 (×8): 3 mL via INTRAVENOUS

## 2016-11-19 MED ORDER — LISINOPRIL 10 MG PO TABS: 10.0000 mg | ORAL_TABLET | Freq: Every day | ORAL | Status: DC

## 2016-11-19 MED ORDER — ACETAMINOPHEN 325 MG PO TABS
650.0000 mg | ORAL_TABLET | Freq: Four times a day (QID) | ORAL | Status: DC | PRN
  Administered 2016-11-21: 650 mg via ORAL
  Filled 2016-11-19: qty 2

## 2016-11-19 MED ORDER — NICOTINE 7 MG/24HR TD PT24
7.0000 mg | MEDICATED_PATCH | TRANSDERMAL | Status: DC
  Administered 2016-11-19 – 2016-11-22 (×4): 7 mg via TRANSDERMAL
  Filled 2016-11-19 (×4): qty 1

## 2016-11-19 MED ORDER — ACETAMINOPHEN 650 MG RE SUPP: 650.0000 mg | Freq: Four times a day (QID) | RECTAL | Status: DC | PRN

## 2016-11-19 NOTE — Progress Notes (Signed)
Subjective: Patient presents today status post left midfoot amputation. Date of surgery 10/12/2016. She reports a malodorous discharge from the surgical site. She is no longer staying in a nursing home.  Objective: Staples intact. There is some drainage noted to the central anterior portion of the incision amputation site. Peri-incisional maceration noted. At the moment there is no dehiscence noted. Amputation stump appears mildly erythematous  Radiographic Exam:  osteotomies sites appear to be stable with routine healing.  Assessment: Status post left midfoot amputation. Date of surgery 10/12/2016.  Plan of care: Patient evaluated. X-rays reviewed. No dehiscence noted today. Staples intact. Dressing changed. Return to clinic in 2 weeks.  Felecia ShellingBrent M. Lanika Colgate, DPM [facility]  Dr. Felecia ShellingBrent M. Sheffield Hawker, DPM    [address]                                        [place], [place] [address]                Office [phone]  Fax [phone]

## 2016-11-19 NOTE — ED Notes (Signed)
Patient to CT.

## 2016-11-19 NOTE — Consult Note (Signed)
Neurology Consultation Reason for Consult: Transient episode of aphasia Referring Physician: Noralee CharsMikell, Asiyah  CC: Speech difficulty  History is obtained from: Patient  HPI: Tara Flores is a 81 y.o. female who presented to the emergency department today with a transient episode of difficulty speaking. She states that this had completely resolved by the time she arrived., On reviewing the ED notes, she might have had some difficulty even on arrival. She currently states that she feels completely at baseline and is having no difficulty with her speech.  While in the emergency department, she has been found to have new onset atrial fibrillation and elevated troponin. Neurology has been consulted for recommendations regarding anticoagulation.  LKW: Unclear tpa given?: no, unclear last seen well, mild symptoms   ROS: A 14 point ROS was performed and is negative except as noted in the HPI.   Past Medical History:  Diagnosis Date  . Anxiety   . Arthritis   . Asthma   . CAD (coronary artery disease)   . Cardiac disease   . COPD (chronic obstructive pulmonary disease) (HCC)   . Depression   . Diabetes mellitus without complication (HCC)   . Hyperlipidemia   . Hypertension   . Hypothyroidism      Family History  Problem Relation Age of Onset  . Alcohol abuse Father   . Cancer Mother        lung  . Heart disease Mother   . Diabetes Sister   . Heart disease Sister      Social History:  reports that she has been smoking.  She has a 63.00 pack-year smoking history. She has never used smokeless tobacco. She reports that she drinks alcohol. She reports that she does not use drugs.   Exam: Current vital signs: BP (!) 109/59 (BP Location: Right Arm)   Pulse (!) 106   Temp 97.8 F (36.6 C) (Oral)   Resp 20   Ht 5\' 4"  (1.626 m)   Wt 62.1 kg (137 lb)   SpO2 95%   BMI 23.52 kg/m  Vital signs in last 24 hours: Temp:  [97.8 F (36.6 C)-98.7 F (37.1 C)] 97.8 F (36.6 C)  (05/31 2101) Pulse Rate:  [87-141] 106 (05/31 2159) Resp:  [17-33] 20 (05/31 2101) BP: (96-125)/(49-93) 109/59 (05/31 2159) SpO2:  [87 %-100 %] 95 % (05/31 2101) Weight:  [62.1 kg (137 lb)] 62.1 kg (137 lb) (05/31 1334)   Physical Exam  Constitutional: Appears well-developed and well-nourished.  Psych: Affect appropriate to situation Eyes: No scleral injection HENT: No OP obstrucion Head: Normocephalic.  Cardiovascular: Normal rate and regular rhythm.  Respiratory: Effort normal and breath sounds normal to anterior ascultation GI: Soft.  No distension. There is no tenderness.  Skin: WDI  Neuro: Mental Status: Patient is awake, alert, oriented to person, place, month, year, and situation. Patient is able to give a clear and coherent history. No signs of aphasia or neglect Cranial Nerves: II: She has significant difficulty with her vision at baseline. She is able to see at least finger movement in the right visual field, though answers are inconsistent(and she doesn't fixate on a point for peripheral vision test very well.). Pupils are equal, round, and reactive to light.   III,IV, VI: EOMI without ptosis or diploplia.  V: Facial sensation is symmetric to temperature VII: Facial movement is symmetric.  VIII: hearing is intact to voice X: Uvula elevates symmetrically XI: Shoulder shrug is symmetric. XII: tongue is midline without atrophy or fasciculations.  Motor: Tone is normal. Bulk is normal. 5/5 strength was present in all four extremities.  Sensory: Sensation is symmetric to light touch and temperature in the arms and legs. Cerebellar: She has difficulty with finger-nose-finger bilaterally due to her visual difficulty, but no clear ataxia    I have reviewed labs in epic and the results pertinent to this consultation are: Normal creatinine, mildly low sodium Elevated troponin  I have reviewed the images obtained: CT head-no intracranial hemorrhage, formal read  pending  Impression: 81 year old female with transient speech difficulty in the setting of new-onset atrial fibrillation. Even if this was not TIA, I still think she would merit anticoagulation. After much convincing, she was able to agree to head CT (very claustrophobic) but states that she would not be able to tolerate an MRI.  Recommendations: 1) agree with anticoagulation, given the return to baseline as long as no hemorrhage on final CT read or clearly acute stroke then I would favor starting anticoagulation, such as apixaban 2) could check LDL, A1c but doubt that this is an atherosclerotic event 3) frequent neuro checks 4) she may benefit from PT given that she will be a significant fall risk, but I think she would need to be a very high risk indeed to preclude anticoagulation 5) stroke team to follow   [name], MD [facility] [phone]  If 7pm- 7am, please page neurology on call as listed in AMION.

## 2016-11-19 NOTE — ED Notes (Signed)
I&O cath performed with Fleet Contrasachel EMT and Mallory NS.

## 2016-11-19 NOTE — ED Notes (Signed)
Attempted to give report x3.  Phone systems kept cutting out due to poor reception.  Called most of report.  Taking patient upstairs on monitor.

## 2016-11-19 NOTE — ED Provider Notes (Signed)
[facility] Provider Note   CSN: [id number] Arrival date & time: 11/19/16  1328     History   Chief Complaint Chief Complaint  Patient presents with  . Altered Mental Status  . Atrial Fibrillation    HPI Tara Flores is a 81 y.o. female.  HPI   Patient 81 year old female past medical history significant for coronary artery disease, COPD, hypertension, hyperlipidemia, diabetes, recent osteomyelitis of the left lower extremity. She is presenting today with new onset A. fib and slurred speech. Patient daughter at bedside reports that she's been slurring her speech on and off and using odd words today. No clear onset. Patient had amputation of the left toes in late April.  Patient is oriented to person time and place. Her speech is mildly pressured and she flits from one idea the other, however her daughter has similar speech patterns. It is unclear whether this is new or old, patient daughter thinks that it is more exacerbated than usual.   Past Medical History:  Diagnosis Date  . Anxiety   . Arthritis   . Asthma   . CAD (coronary artery disease)   . Cardiac disease   . COPD (chronic obstructive pulmonary disease) (HCC)   . Depression   . Diabetes mellitus without complication (HCC)   . Hyperlipidemia   . Hypertension   . Hypothyroidism     Patient Active Problem List   Diagnosis Date Noted  . New onset a-fib (HCC) 11/20/2016  . Atrial fibrillation with RVR (HCC)   . COPD exacerbation (HCC)   . Goals of care, counseling/discussion   . Palliative care by specialist   . TIA (transient ischemic attack) 11/19/2016  . Diabetic foot ulcer (HCC) 10/10/2016  . Hypokalemia 10/10/2016  . Anemia due to chronic kidney disease 10/10/2016  . Cellulitis in diabetic foot (HCC) 10/09/2016  . Venous ulcer of left leg (HCC) 07/28/2016  . Dry skin dermatitis 07/28/2016  . Type 2 diabetes mellitus with hyperglycemia (HCC) 02/06/2015  . Hip fracture (HCC) 02/05/2015  .  Closed right hip fracture (HCC) 02/05/2015  . Abnormal EKG 02/05/2015  . Tobacco abuse 02/05/2015  . DM2 (diabetes mellitus, type 2) (HCC) 02/05/2015  . Essential hypertension 02/05/2015  . Hypothyroidism 02/05/2015  . Coronary artery disease involving native coronary artery of native heart without angina pectoris     Past Surgical History:  Procedure Laterality Date  . AMPUTATION Left 10/12/2016   Procedure: TRANSMETATARSAL AMPUTATION LEFT FOOT WITH ACHILLES TENDON LENGTHENING;  Surgeon: Felecia ShellingBrent M Evans, DPM;  Location: [facility] OR;  Service: Podiatry;  Laterality: Left;  . APPENDECTOMY    . CARDIAC CATHETERIZATION N/A 02/05/2015   Procedure: Left Heart Cath and Coronary Angiography;  Surgeon: Kathleene Hazelhristopher D McAlhany, MD;  [facility];  Service: Cardiovascular;  Laterality: N/A;  . EYE SURGERY  2006   unsure if exact procedure  . HIP FRACTURE SURGERY    . INTRAMEDULLARY (IM) NAIL INTERTROCHANTERIC Right 02/06/2015   Procedure: INTRAMEDULLARY (IM) NAIL RIGHT HIP;  Surgeon: Tarry KosNaiping M Xu, MD;  Location: [facility] OR;  Service: Orthopedics;  Laterality: Right;  . JOINT REPLACEMENT     due to hip fracture    OB History    No data available       Home Medications    Prior to Admission medications   Medication Sig Start Date End Date Taking? Authorizing Provider  acetaminophen (TYLENOL) 325 MG tablet Take 2 tablets (650 mg total) by mouth every 6 (six) hours as  needed for mild pain (or Fever >/= 101). 10/13/16  Yes Leroy Sea, MD  albuterol (PROVENTIL HFA;VENTOLIN HFA) 108 (90 Base) MCG/ACT inhaler Inhale 1 puff into the lungs every 6 (six) hours as needed for wheezing or shortness of breath. Reported on 09/06/2015 09/25/15  Yes Elvina Sidle, MD  albuterol (PROVENTIL) (2.5 MG/3ML) 0.083% nebulizer solution USE 1 VIAL VIA NEBULIZER  EVERY 6 HOURS AS NEEDED FOR WHEEZING OR SHORTNESS OF  BREATH. 11/02/16  Yes Sharon Seller, NP  insulin NPH Human (HUMULIN N,NOVOLIN N) 100 UNIT/ML  injection Inject 12-25 Units into the skin 2 (two) times daily. 25 units in the morning and 12 units bedtime   Yes [provider]  levothyroxine (SYNTHROID, LEVOTHROID) 150 MCG tablet Take 150 mcg by mouth daily before breakfast.   Yes [provider]  nitroGLYCERIN (NITROSTAT) 0.4 MG SL tablet Place 1 tablet (0.4 mg total) under the tongue every 5 (five) minutes as needed for chest pain. 07/18/15  Yes Wendall Stade, MD  oxyCODONE-acetaminophen (PERCOCET/ROXICET) 5-325 MG tablet Take 1 tablet by mouth every 6 (six) hours as needed for severe pain. 10/13/16  Yes Leroy Sea, MD  sulfamethoxazole-trimethoprim (BACTRIM DS,SEPTRA DS) 800-160 MG tablet Take 1 tablet by mouth 2 (two) times daily. For 26 days 11/07/16  Yes [provider]  apixaban (ELIQUIS) 5 MG TABS tablet Take 1 tablet (5 mg total) by mouth 2 (two) times daily. 11/23/16   Marquette Saa, MD  aspirin 81 MG tablet Take 81 mg by mouth daily.    [provider]  diltiazem (CARDIZEM CD) 120 MG 24 hr capsule Take 1 capsule (120 mg total) by mouth daily at 6 PM. 11/23/16   Marquette Saa, MD  insulin aspart (NOVOLOG) 100 UNIT/ML injection Before each meal 3 times a day, 140-199 - 2 units, 200-250 - 4 units, 251-299 - 6 units,  300-349 - 8 units,  350 or above 10 units. Insulin PEN if approved, provide syringes and needles if needed. Patient not taking: Reported on 11/19/2016 10/13/16   Leroy Sea, MD  insulin glargine (LANTUS) 100 UNIT/ML injection Inject 0.15 mLs (15 Units total) into the skin 2 (two) times daily. Patient not taking: Reported on 11/19/2016 10/13/16   Leroy Sea, MD    Family History Family History  Problem Relation Age of Onset  . Alcohol abuse Father   . Cancer Mother        lung  . Heart disease Mother   . Diabetes Sister   . Heart disease Sister     Social History Social History  Substance Use Topics  . Smoking status: Current Every Day  Smoker    Packs/day: 1.00    Years: 63.00  . Smokeless tobacco: Never Used     Comment: has recently cut back, conts to cut back but repors she has a lot of excuses   . Alcohol use 0.0 oz/week     Comment: 3 times per month     Allergies   Brethine [terbutaline] and Keflex [cephalexin]   Review of Systems Review of Systems  Constitutional: Negative for activity change.  Respiratory: Negative for shortness of breath.   Cardiovascular: Negative for chest pain.  Gastrointestinal: Negative for abdominal pain.  Psychiatric/Behavioral: Positive for agitation and confusion. The patient is nervous/anxious and is hyperactive.   All other systems reviewed and are negative.    Physical Exam Updated Vital Signs BP (!) 117/56 (BP Location: Left Arm)   Pulse 96  Temp 97.7 F (36.5 C) (Oral)   Resp 20   Ht 5\' 4"  (1.626 m)   Wt 61 kg (134 lb 8 oz)   SpO2 96%   BMI 23.09 kg/m   Physical Exam  Constitutional: She is oriented to person, place, and time. She appears well-developed and well-nourished.  HENT:  Head: Normocephalic and atraumatic.  Eyes: EOM are normal. Right eye exhibits no discharge. Left eye exhibits no discharge.  Cardiovascular: Normal heart sounds.   No murmur heard. Irregularly irregular.  Pulmonary/Chest: Effort normal and breath sounds normal. She has no wheezes. She has no rales.  Abdominal: Soft. She exhibits no distension. There is no tenderness.  Musculoskeletal:  Left lower extremity with toe amputations. No evidence of worsening infection.  Bilateral legs have chronic venous changes.  Neurological: She is oriented to person, place, and time. She displays normal reflexes. No cranial nerve deficit. Coordination normal.  Skin: Skin is warm and dry. She is not diaphoretic.  Psychiatric:  Patient with mildly pressured speech. Alert oriented 3. No cranial nerve deficit. No weakness  Nursing note and vitals reviewed.    ED Treatments / Results   Labs (all labs ordered are listed, but only abnormal results are displayed) Labs Reviewed  COMPREHENSIVE METABOLIC PANEL - Abnormal; Notable for the following:       Result Value   Sodium 129 (*)    Chloride 97 (*)    BUN 21 (*)    Calcium 8.6 (*)    Albumin 2.8 (*)    ALT 10 (*)    All other components within normal limits  CBC WITH DIFFERENTIAL/PLATELET - Abnormal; Notable for the following:    RBC 3.72 (*)    Hemoglobin 11.3 (*)    HCT 34.4 (*)    Lymphs Abs 0.6 (*)    All other components within normal limits  URINALYSIS, ROUTINE W REFLEX MICROSCOPIC - Abnormal; Notable for the following:    Color, Urine AMBER (*)    Glucose, UA 150 (*)    Protein, ur 100 (*)    All other components within normal limits  BRAIN NATRIURETIC PEPTIDE - Abnormal; Notable for the following:    B Natriuretic Peptide 464.5 (*)    All other components within normal limits  TROPONIN I - Abnormal; Notable for the following:    Troponin I 0.36 (*)    All other components within normal limits  TROPONIN I - Abnormal; Notable for the following:    Troponin I 0.51 (*)    All other components within normal limits  BASIC METABOLIC PANEL - Abnormal; Notable for the following:    Sodium 125 (*)    Chloride 92 (*)    Glucose, Bld 362 (*)    Calcium 8.5 (*)    All other components within normal limits  FOLATE RBC - Abnormal; Notable for the following:    Hematocrit 32.2 (*)    All other components within normal limits  HEMOGLOBIN A1C - Abnormal; Notable for the following:    Hgb A1c MFr Bld 9.0 (*)    All other components within normal limits  RAPID URINE DRUG SCREEN, HOSP PERFORMED - Abnormal; Notable for the following:    Benzodiazepines POSITIVE (*)    All other components within normal limits  HEPARIN LEVEL (UNFRACTIONATED) - Abnormal; Notable for the following:    Heparin Unfractionated <0.10 (*)    All other components within normal limits  CBC - Abnormal; Notable for the following:    RBC  3.38 (*)    Hemoglobin 10.3 (*)    HCT 31.5 (*)    All other components within normal limits  GLUCOSE, CAPILLARY - Abnormal; Notable for the following:    Glucose-Capillary 269 (*)    All other components within normal limits  GLUCOSE, CAPILLARY - Abnormal; Notable for the following:    Glucose-Capillary 372 (*)    All other components within normal limits  TROPONIN I - Abnormal; Notable for the following:    Troponin I 0.29 (*)    All other components within normal limits  TROPONIN I - Abnormal; Notable for the following:    Troponin I 0.40 (*)    All other components within normal limits  GLUCOSE, CAPILLARY - Abnormal; Notable for the following:    Glucose-Capillary 372 (*)    All other components within normal limits  HEPARIN LEVEL (UNFRACTIONATED) - Abnormal; Notable for the following:    Heparin Unfractionated >2.20 (*)    All other components within normal limits  TROPONIN I - Abnormal; Notable for the following:    Troponin I 0.37 (*)    All other components within normal limits  CBC - Abnormal; Notable for the following:    RBC 3.47 (*)    Hemoglobin 10.5 (*)    HCT 32.0 (*)    All other components within normal limits  BASIC METABOLIC PANEL - Abnormal; Notable for the following:    Sodium 123 (*)    Chloride 88 (*)    Glucose, Bld 493 (*)    BUN 22 (*)    Calcium 8.6 (*)    All other components within normal limits  GLUCOSE, CAPILLARY - Abnormal; Notable for the following:    Glucose-Capillary 389 (*)    All other components within normal limits  GLUCOSE, CAPILLARY - Abnormal; Notable for the following:    Glucose-Capillary 276 (*)    All other components within normal limits  GLUCOSE, CAPILLARY - Abnormal; Notable for the following:    Glucose-Capillary 456 (*)    All other components within normal limits  GLUCOSE, CAPILLARY - Abnormal; Notable for the following:    Glucose-Capillary 379 (*)    All other components within normal limits  BASIC METABOLIC PANEL  - Abnormal; Notable for the following:    Sodium 124 (*)    Chloride 92 (*)    Glucose, Bld 322 (*)    Calcium 8.8 (*)    All other components within normal limits  GLUCOSE, CAPILLARY - Abnormal; Notable for the following:    Glucose-Capillary 252 (*)    All other components within normal limits  CBC - Abnormal; Notable for the following:    RBC 3.36 (*)    Hemoglobin 10.2 (*)    HCT 30.9 (*)    All other components within normal limits  BASIC METABOLIC PANEL - Abnormal; Notable for the following:    Sodium 128 (*)    Chloride 93 (*)    Glucose, Bld 246 (*)    Calcium 8.6 (*)    All other components within normal limits  GLUCOSE, CAPILLARY - Abnormal; Notable for the following:    Glucose-Capillary 102 (*)    All other components within normal limits  GLUCOSE, CAPILLARY - Abnormal; Notable for the following:    Glucose-Capillary 214 (*)    All other components within normal limits  GLUCOSE, CAPILLARY - Abnormal; Notable for the following:    Glucose-Capillary 239 (*)    All other components within normal limits  GLUCOSE, CAPILLARY -  Abnormal; Notable for the following:    Glucose-Capillary 347 (*)    All other components within normal limits  GLUCOSE, CAPILLARY - Abnormal; Notable for the following:    Glucose-Capillary 363 (*)    All other components within normal limits  GLUCOSE, CAPILLARY - Abnormal; Notable for the following:    Glucose-Capillary 157 (*)    All other components within normal limits  GLUCOSE, CAPILLARY - Abnormal; Notable for the following:    Glucose-Capillary 455 (*)    All other components within normal limits  GLUCOSE, CAPILLARY - Abnormal; Notable for the following:    Glucose-Capillary 298 (*)    All other components within normal limits  GLUCOSE, CAPILLARY - Abnormal; Notable for the following:    Glucose-Capillary 284 (*)    All other components within normal limits  I-STAT TROPOININ, ED - Abnormal; Notable for the following:    Troponin i,  poc 0.14 (*)    All other components within normal limits  PROTIME-INR  ETHANOL  TSH  TSH  VITAMIN B12  RPR  OSMOLALITY  SODIUM, URINE, RANDOM  I-STAT CG4 LACTIC ACID, ED  I-STAT CG4 LACTIC ACID, ED    EKG  EKG Interpretation  Date/Time:  Thursday Nov 19 2016 13:35:50 EDT Ventricular Rate:  120 PR Interval:    QRS Duration: 76 QT Interval:  321 QTC Calculation: 454 R Axis:   -26 Text Interpretation:  Atrial fibrillation Borderline left axis deviation Anterior infarct, old Nonspecific T abnormalities, lateral leads Atrial fibrillation Confirmed by Corlis Leak, Bretton Tandy [phone]) on 11/19/2016 1:44:05 PM       Radiology No results found.  Procedures Procedures (including critical care time)  Medications Ordered in ED Medications  midazolam (VERSED) injection 5 mg (5 mg Intravenous Given 11/19/16 1803)  iopamidol (ISOVUE-370) 76 % injection (50 mLs  Contrast Given 11/19/16 1908)  furosemide (LASIX) injection 20 mg (20 mg Intravenous Given 11/19/16 2208)  heparin bolus via infusion 4,000 Units (4,000 Units Intravenous Bolus from Bag 11/19/16 2324)  furosemide (LASIX) injection 20 mg (20 mg Intravenous Given 11/20/16 1210)  insulin glargine (LANTUS) injection 10 Units (10 Units Subcutaneous Given 11/21/16 0711)     Initial Impression / Assessment and Plan / ED Course  I have reviewed the triage vital signs and the nursing notes.  Pertinent labs & imaging results that were available during my care of the patient were reviewed by me and considered in my medical decision making (see chart for details).    Patient 81 year old female past medical history significant for coronary artery disease, COPD, hypertension, hyperlipidemia, diabetes, recent osteomyelitis of the left lower extremity.  She is presenting today with new onset A. fib and mildly altered speech patterns. She was brought here from home she had several episodes today where she had increasingly slurred speech. It is difficult  to tell what his baseline. Patient's daughter thinks she has moments where she is going in and out of slurred speech and difficulty with word finding. On exam she has subtle slurring of words that seems to come and go even within a sentence. She has new-onset A. fib. We will admit her for anticoagulation, rhythm control, and stroke workup.  3:03 PM Troponin leak. We'll consult with cardiology is that they are following with inpatient. Patient's "altered mental status" seems to have resolved. She is cantankerous and refusing CT head. In weighing the risks and benefits I think the risk of sedating her completely or get the CAT, I don't think the scan is worth the  benefit. She has no focal neurologic findings at this time.      Final Clinical Impressions(s) / ED Diagnoses   Final diagnoses:  TIA (transient ischemic attack)    New Prescriptions Discharge Medication List as of 11/23/2016  5:12 PM    START taking these medications   Details  apixaban (ELIQUIS) 5 MG TABS tablet Take 1 tablet (5 mg total) by mouth 2 (two) times daily., Starting Mon 11/23/2016, Normal    diltiazem (CARDIZEM CD) 120 MG 24 hr capsule Take 1 capsule (120 mg total) by mouth daily at 6 PM., Starting Mon 11/23/2016, Normal         Aliviana Burdell Lyn, MD 11/24/16 1503

## 2016-11-19 NOTE — Consult Note (Deleted)
Entered in error

## 2016-11-19 NOTE — Consult Note (Signed)
Cardiology Consult    Patient ID: Tara Flores MRN: [id number], DOB/AGE: Feb 18, 1936   Admit date: 11/19/2016 Date of Consult: 11/19/2016  Primary Physician: Clovia Cuff, MD Primary Cardiologist: Dr. Johnsie Cancel Requesting Provider: Dr. Thomasene Lot  Reason for Consult: Atrial fibrillation  Patient Profile    Tara Flores has PHM significant for CAD s/p heart cath with moderate (60%) LAD disease, DM, COPD, HTN, HLD, and anxiety. She is s/p partial left foot amputation for osteomyolitis  She was brought to [facility] after a bout of garbled speech and altered mental status. Upon arrival, she was found to be in Afib with rates in the 120-140s.  Tara Flores is a 81 y.o. female who is being seen today for the evaluation of atrial fibrillation at the request of Dr. Thomasene Lot.   Past Medical History   Past Medical History:  Diagnosis Date  . Anxiety   . Arthritis   . Asthma   . CAD (coronary artery disease)   . Cardiac disease   . COPD (chronic obstructive pulmonary disease) ([place])   . Depression   . Diabetes mellitus without complication ([place])   . Hyperlipidemia   . Hypertension   . Hypothyroidism     Past Surgical History:  Procedure Laterality Date  . AMPUTATION Left 10/12/2016   Procedure: TRANSMETATARSAL AMPUTATION LEFT FOOT WITH ACHILLES TENDON LENGTHENING;  Surgeon: Edrick Kins, DPM;  Location: [place];  Service: Podiatry;  Laterality: Left;  . APPENDECTOMY    . CARDIAC CATHETERIZATION N/A 02/05/2015   Procedure: Left Heart Cath and Coronary Angiography;  Surgeon: Burnell Blanks, MD;  Location: [place] [facility];  Service: Cardiovascular;  Laterality: N/A;  . EYE SURGERY  2006   unsure if exact procedure  . HIP FRACTURE SURGERY    . INTRAMEDULLARY (IM) NAIL INTERTROCHANTERIC Right 02/06/2015   Procedure: INTRAMEDULLARY (IM) NAIL RIGHT HIP;  Surgeon: Leandrew Koyanagi, MD;  Location: [place];  Service: Orthopedics;  Laterality: Right;  . JOINT REPLACEMENT     due to hip fracture     Allergies  Allergies  Allergen Reactions  . Brethine [Terbutaline]     Made patient confused  . Keflex [Cephalexin] Nausea Only    Loss of appetite    History of Present Illness    Tara Flores is known to this service and last saw Dr. Johnsie Cancel in clinic on 07/15/15 for follow up to her heart catheterization 01/2015. At that time, she was in her usual state of health and denied anginal symptoms. She was prescribed ASA, coreg, and lisinopril. She states she ran out of coreg approximately 3 days ago.   She presents to [facility] today after an episode of garbled speech and altered mental status. She was subsequently found to be in Afib with rates fluctuating between 120-140. EDP attempted to proceed with stroke workup; however, patient is currently refusing a head CT. She denies chest pain, palpitations, dizziness, lightheadedness, nausea, vomiting, recent illness, and near-/syncope. She is tolerating the rhythm well. On my exam, she is alert and oriented.   Inpatient Medications       Outpatient Medications    Prior to Admission medications   Medication Sig Start Date End Date Taking? Authorizing Provider  ACCU-CHEK FASTCLIX LANCETS MISC Check blood sugar 2-3 times daily DX E11.9 09/28/16   Estill Dooms, MD  acetaminophen (TYLENOL) 325 MG tablet Take 2 tablets (650 mg total) by mouth every 6 (six) hours as needed for mild pain (or Fever >/= 101). 10/13/16  Thurnell Lose, MD  albuterol (PROVENTIL HFA;VENTOLIN HFA) 108 (90 Base) MCG/ACT inhaler Inhale 1 puff into the lungs every 6 (six) hours as needed for wheezing or shortness of breath. Reported on 09/06/2015 09/25/15   Robyn Haber, MD  albuterol (PROVENTIL) (2.5 MG/3ML) 0.083% nebulizer solution USE 1 VIAL VIA NEBULIZER  EVERY 6 HOURS AS NEEDED FOR WHEEZING OR SHORTNESS OF  BREATH. 11/02/16   Lauree Chandler, NP  aspirin 81 MG tablet Take 81 mg by mouth daily.    [provider]  Blood Glucose Monitoring Suppl (ONETOUCH VERIO)  w/Device KIT 1 kit by Does not apply route 3 (three) times daily. Use to test blood sugar three times daily. Dx:E11.9 11/11/16   Estill Dooms, MD  carvedilol (COREG) 6.25 MG tablet Take 1 tablet (6.25 mg total) by mouth 2 (two) times daily with a meal. 02/04/16   Estill Dooms, MD  ceFAZolin (ANCEF) 2-4 GM/100ML-% IVPB Inject 100 mLs (2 g total) into the vein every 8 (eight) hours. 10/13/16 11/24/16  Thurnell Lose, MD  glucose blood (ONETOUCH VERIO) test strip Use to test blood sugar three times daily. Dx E11.9 11/11/16   Estill Dooms, MD  insulin aspart (NOVOLOG) 100 UNIT/ML injection Before each meal 3 times a day, 140-199 - 2 units, 200-250 - 4 units, 251-299 - 6 units,  300-349 - 8 units,  350 or above 10 units. Insulin PEN if approved, provide syringes and needles if needed. 10/13/16   Thurnell Lose, MD  insulin glargine (LANTUS) 100 UNIT/ML injection Inject 0.15 mLs (15 Units total) into the skin 2 (two) times daily. 10/13/16   Thurnell Lose, MD  levothyroxine (SYNTHROID, LEVOTHROID) 150 MCG tablet Take 150 mcg by mouth daily before breakfast.    [provider]  lisinopril (PRINIVIL,ZESTRIL) 10 MG tablet Take 1 tablet (10 mg total) by mouth daily. 05/11/16   Josue Hector, MD  nicotine (NICODERM CQ - DOSED IN MG/24 HOURS) 21 mg/24hr patch Place 1 patch (21 mg total) onto the skin daily. 10/14/16   Thurnell Lose, MD  nitroGLYCERIN (NITROSTAT) 0.4 MG SL tablet Place 1 tablet (0.4 mg total) under the tongue every 5 (five) minutes as needed for chest pain. 07/18/15   Josue Hector, MD  oxyCODONE-acetaminophen (PERCOCET/ROXICET) 5-325 MG tablet Take 1 tablet by mouth every 6 (six) hours as needed for severe pain. 10/13/16   Thurnell Lose, MD     Family History    Family History  Problem Relation Age of Onset  . Alcohol abuse Father   . Cancer Mother        lung  . Heart disease Mother   . Diabetes Sister   . Heart disease Sister     Social History      Social History   Social History  . Marital status: Widowed    Spouse name: N/A  . Number of children: N/A  . Years of education: N/A   Occupational History  . Not on file.   Social History Main Topics  . Smoking status: Current Every Day Smoker    Packs/day: 1.00    Years: 63.00  . Smokeless tobacco: Never Used     Comment: has recently cut back, conts to cut back but repors she has a lot of excuses   . Alcohol use 0.0 oz/week     Comment: 3 times per month  . Drug use: No  . Sexual activity: No   Other Topics Concern  .  Not on file   Social History Narrative   Diet:      Do you drink/ eat things with caffeine? yes      Marital status:  widowed                             What year were you married ? 1969      Do you live in a house, apartment,assistred living, condo, trailer, etc.)?trailer      Is it one or more stories? no      How many persons live in your home ? none      Do you have any pets in your home ?(please list) 1 dog      Current or past profession: LPN      Do you exercise?  yes                            Type & how often: with PT      Do you have a living will? no      Do you have a DNR form? no                      If not, do you want to discuss one? yes      Do you have signed POA?HPOA forms?   no              If so, please bring to your        appointment           Review of Systems    General:  No chills, fever, night sweats or weight changes.  Cardiovascular:  No chest pain, dyspnea on exertion, edema, orthopnea, palpitations, paroxysmal nocturnal dyspnea. Dermatological: No rash, lesions/masses Respiratory: No cough, dyspnea Urologic: No hematuria, dysuria Abdominal:   No nausea, vomiting, diarrhea, bright red blood per rectum, melena, or hematemesis Neurologic:  No visual changes, changes in mental status. All other systems reviewed and are otherwise negative except as noted above.  Physical Exam    Blood pressure 120/78,  pulse (!) 110, temperature 98.7 F (37.1 C), temperature source Oral, resp. rate 19, height _0  (1.626 m), weight 137 lb (62.1 kg), SpO2 99 %.  General: Pleasant, NAD Psych: Normal affect. Neuro: Alert and oriented X 3. Moves all extremities spontaneously. HEENT: Normal  Neck: Supple without bruits or JVD. Lungs:  Resp regular and unlabored, coarse sounds in bases L > R Heart: Irregular rhythm and irregular rate, no murmurs. Abdomen: Soft, non-tender, non-distended, BS + x 4.  Extremities: No clubbing, cyanosis, trace edema. DP/PT/Radials faint bilaterally.  Labs    Troponin [facility] Test)  Recent Labs  11/19/16 1443  TROPIPOC 0.14*   No results for input(s): CKTOTAL, CKMB, TROPONINI in the last 72 hours. Lab Results  Component Value Date   WBC 8.1 11/19/2016   HGB 11.3 (L) 11/19/2016   HCT 34.4 (L) 11/19/2016   MCV 92.5 11/19/2016   PLT 351 11/19/2016    Recent Labs Lab 11/19/16 1435  NA 129*  K 4.5  CL 97*  CO2 22  BUN 21*  CREATININE 0.71  CALCIUM 8.6*  PROT 7.3  BILITOT 0.8  ALKPHOS 120  ALT 10*  AST 17  GLUCOSE 89   Lab Results  Component Value Date   CHOL 136 02/08/2015   HDL 52 02/08/2015   LDLCALC  70 02/08/2015   TRIG 72 02/08/2015   No results found for: [name]   Radiology Studies    Dg Chest 2 View  Result Date: 11/19/2016 CLINICAL DATA:  81 year old female with chest pain slurred speech, altered mental status, confusion. EXAM: CHEST  2 VIEW COMPARISON:  10/11/2016 and earlier. Thoracic spine radiographs 12/24/2015. FINDINGS: Upright AP and lateral views of the chest. Stable cardiomegaly and mediastinal contours. Tortuous thoracic aorta with calcified atherosclerosis. Chronic hyperinflation. Chronic but increased streaky opacity at the left lung base. No pneumothorax. Acute on chronic pulmonary interstitial opacity. No definite pleural effusion. Osteopenia. New or increased lower thoracic versus L1 compression fracture since July 2017.  There was mild superior endplate deformity at the same level on the prior study. Negative visible bowel gas pattern. IMPRESSION: 1. Acute on chronic increased pulmonary interstitial opacity and streaky left lower lobe opacity. Differential considerations include interstitial edema with atelectasis and viral/atypical respiratory infection. No definite pleural effusion. 2. Chronic compression fracture suspected at the thoracolumbar junction, but increased since July 2017. Osteopenia. 3. Chronic cardiomegaly and calcified aortic atherosclerosis. Electronically Signed   By: Genevie Ann M.D.   On: 11/19/2016 15:10   Dg Foot Complete Left  Result Date: 11/18/2016 Please see detailed radiograph report in office note.   ECG & Cardiac Imaging    EKG 11/19/16: Atrial fibrillation, poor R wave progression, cannot rule out anterior infarct   Echocardiogram 02/05/15: Study Conclusions - Left ventricle: The cavity size was normal. Wall thickness was   increased in a pattern of mild LVH. Systolic function was normal.   The estimated ejection fraction was in the range of 60% to 65%.   Wall motion was normal; there were no regional wall motion   abnormalities. Doppler parameters are consistent with abnormal   left ventricular relaxation (grade 1 diastolic dysfunction). - Aortic valve: Poorly visualized. Probably trileaflet; mildly   calcified leaflets. There was no stenosis. - Mitral valve: Mildly calcified annulus. There was no significant   regurgitation. - Left atrium: The atrium was moderately dilated. - Right ventricle: The cavity size was normal. Systolic function   was normal. - Right atrium: Prominent Eustachian valve. - Pulmonary arteries: No complete TR doppler jet so unable to   estimate PA systolic pressure. - Inferior vena cava: The vessel was normal in size. The   respirophasic diameter changes were in the normal range (= 50%),   consistent with normal central venous  pressure.  Impressions: - Normal LV size with mild LV hypertrophy. EF 60-65%. Normal RV   size and systolic function. Moderate LAE. No significant valvular   abnormalities.   Left Heart Catheterization 02/05/15: Cath 02/05/15 films reviewed   Prox Cx to Mid Cx lesion, 30% stenosed.  Prox RCA lesion, 30% stenosed.  Mid RCA lesion, 30% stenosed.  Dist RCA lesion, 30% stenosed.  Prox LAD to Dist LAD lesion, 60% stenosed.  The left ventricular systolic function is normal.  1. Triple vessel CAD with mild to moderate diffuse disease in the heavily calcified RCA and Circumflex system.  2. Moderate heavily calcified stenosis mid LAD. Fractional flow reserve 0.81 suggesting the stenosis is moderate and not flow limiting.  3. Normal LV systolic function   Assessment & Plan    1. Atrial fibrillation This appears to be her first documented atrial fibrillation with RVR. Rates are fluctuating 120-140s. Pt is currently refusing head CT for stroke workup and to rule out brain hemorrhage. We will not anticoagulated her for her Afib until brain  bleed is ruled out with imaging.  This patients CHA2DS2-VASc Score and unadjusted Ischemic Stroke Rate (% per year) is equal to 9.7 % stroke rate/year from a score of 6 (at least 6 for age, female, HTN, DM, and vascular insufficiency +/- stroke). If she agrees to head CT and hemorrhage is ruled out, recommend starting xarelto for anticoagulation. We will focus on rate controlling agents at this time. Begin cardizem 30 mg every 6 hrs.  2. CAD s/p heart cath 01/2015 with 60% stenosis - she denies anginal symptoms - continue ASA and lisinopril as pressure tolerates after rate-controlling agents - troponin 0.14, continue to cycle troponins, but this is likely demand ischemia in the setting of rapid Afib. - EKG with Afib, can't rule out anterior infarct   3. HTN - restart lisinopril as pressure allows after rate-controlling agents are started   4.  COPD - nicotine patch per primary team    Signed, Ledora Bottcher, PA-C 11/19/2016, 3:56 PM [phone]    As above, patient seen and examined. Briefly she is a 81 year old female with past medical history of moderate coronary artery disease, diabetes mellitus, hypertension, hyperlipidemia, COPD, anxiety, recent partial left foot amputation for osteomyelitis who we are asked to evaluate for newly diagnosed atrial fibrillation. Patient apparently was noted to have difficulty with speech for approximately one hour earlier today. She is a difficult historian but denies loss of strength or sensation in extremities. She denies dyspnea at present. No chest pain, palpitations or syncope. She is now back to baseline but also noted to be in atrial fibrillation and cardiology asked to evaluate.  Laboratories show troponin 0.14. Sodium 129. Hemoglobin 11.3. Electrocardiogram shows atrial fibrillation with a rapid ventricular response; LAD; prior septal infarct cannot be excluded.  1 new onset atrial fibrillation-patient is not having significant dyspnea or chest pain and no palpitations. Her heart rate is elevated. Add Cardizem 30 mg every 6 hours for rate control and advance as needed. Check echocardiogram for LV function. Check TSH. CHADSvasc 6. She would benefit from anticoagulation long-term to reduce the incidence of embolic event. However she had transient dysarthria earlier today. Question TIA or CVA. She needs a head CT to rule out bleed before initiating. It is not clear that she will be agreeable to this. She states I am DO NOT RESUSCITATE. I explained the risk of stroke without anticoagulation. She may be too high risk regardless as she is legally blind, has had recent falls and recent partial amputation of her left foot making her unsteady. If she has had a TIA in the opinion of neurology then would likely try anticoagulation to see if she tolerates after head CT performed revealing no  bleeding.  2 question TIA-would ask neurology to evaluate.  3 hypertension-given addition of Cardizem. Hold lisinopril and carvedilol. Adjust regimen as needed.  4 possible cellulitis lower extremities and status post recent partial amputation left foot-May need antibiotics. Will leave to primary service.  5 Coronary artery disease-if she is not anticoagulated long-term will treat with aspirin. She will also benefit from a statin.   6 elevated troponin-no chest pain. We can continue to cycle but doubt ischemia. Also doubt she would be a candidate for aggressive cardiac evaluation.  Difficult situation as not clear patient will be agreeable to treatment. I have explained the risks of atrial fibrillation including stroke. She states she may be agreeable to head CT. We will follow.  Kirk Ruths, MD

## 2016-11-19 NOTE — ED Notes (Signed)
CT called. Will be around 6:30 to scan patient.

## 2016-11-19 NOTE — ED Notes (Signed)
Patient at CT

## 2016-11-19 NOTE — Telephone Encounter (Signed)
I called patient's daughter, Tara Flores, to find out if patient did change PCP based [facility].   Tara Flores did confirm that patient would no longer be a patient at this office. Daughter stated that patient's new PCP would provide home visits and this was why the change was needed.   Tara Flores has been removed as PCP.

## 2016-11-19 NOTE — ED Triage Notes (Signed)
[facility]. AMS and slowed speech. Patient only alert and oriented to person. Baseline a/ox4. According to family and PT this has been going on for the last 2 months. Some aphasia. No hx of CVA. New onset afib today, HR 110-150. No blood thinners. Patient denies pain. Recent amputation of left toes in April d/t osteomyelitis. ekg afibrvr. Ems v/s 132/77, 121 HR, 16 RR, 98% RA, 83 cbg. Hx DM, copd, kidney, legally blind. 20 LFA.

## 2016-11-19 NOTE — Progress Notes (Signed)
ANTICOAGULATION CONSULT NOTE - Initial Consult  Pharmacy Consult for Heparin Indication: atrial fibrillation  Allergies  Allergen Reactions  . Brethine [Terbutaline]     Made patient confused  . Keflex [Cephalexin] Nausea Only    Loss of appetite    Patient Measurements: Height: 5\' 4"  (162.6 cm) Weight: 137 lb (62.1 kg) IBW/kg (Calculated) : 54.7 Heparin Dosing Weight: 62 kg  Vital Signs: Temp: 97.8 F (36.6 C) (05/31 2101) Temp Source: Oral (05/31 2101) BP: 109/59 (05/31 2159) Pulse Rate: 106 (05/31 2159)  Labs:  Recent Labs  11/19/16 1435 11/19/16 1939  HGB 11.3*  --   HCT 34.4*  --   PLT 351  --   LABPROT 13.1  --   INR 0.99  --   CREATININE 0.71  --   TROPONINI  --  0.36*    Estimated Creatinine Clearance: 49.2 mL/min (by C-G formula based on SCr of 0.71 mg/dL).   Medical History: Past Medical History:  Diagnosis Date  . Anxiety   . Arthritis   . Asthma   . CAD (coronary artery disease)   . Cardiac disease   . COPD (chronic obstructive pulmonary disease) ([facility])   . Depression   . Diabetes mellitus without complication ([facility])   . Hyperlipidemia   . Hypertension   . Hypothyroidism     Medications:  Prescriptions Prior to Admission  Medication Sig Dispense Refill Last Dose  . acetaminophen (TYLENOL) 325 MG tablet Take 2 tablets (650 mg total) by mouth every 6 (six) hours as needed for mild pain (or Fever >/= 101).   11/19/2016 at Unknown time  . albuterol (PROVENTIL HFA;VENTOLIN HFA) 108 (90 Base) MCG/ACT inhaler Inhale 1 puff into the lungs every 6 (six) hours as needed for wheezing or shortness of breath. Reported on 09/06/2015 100 each 3 Past Week at Unknown time  . albuterol (PROVENTIL) (2.5 MG/3ML) 0.083% nebulizer solution USE 1 VIAL VIA NEBULIZER  EVERY 6 HOURS AS NEEDED FOR WHEEZING OR SHORTNESS OF  BREATH. 1125 mL 1 Past Week at Unknown time  . carvedilol (COREG) 6.25 MG tablet Take 1 tablet (6.25 mg total) by mouth 2 (two) times daily with a  meal. 180 tablet 3 Past Week at Unknown time  . insulin NPH Human (HUMULIN N,NOVOLIN N) 100 UNIT/ML injection Inject 12-25 Units into the skin 2 (two) times daily. 25 units in the morning and 12 units bedtime   11/19/2016 at am  . levothyroxine (SYNTHROID, LEVOTHROID) 150 MCG tablet Take 150 mcg by mouth daily before breakfast.   11/19/2016 at Unknown time  . lisinopril (PRINIVIL,ZESTRIL) 10 MG tablet Take 1 tablet (10 mg total) by mouth daily. 90 tablet 0 11/19/2016 at Unknown time  . nitroGLYCERIN (NITROSTAT) 0.4 MG SL tablet Place 1 tablet (0.4 mg total) under the tongue every 5 (five) minutes as needed for chest pain. 25 tablet 3  at prn  . oxyCODONE-acetaminophen (PERCOCET/ROXICET) 5-325 MG tablet Take 1 tablet by mouth every 6 (six) hours as needed for severe pain. 15 tablet 0  at prn  . sulfamethoxazole-trimethoprim (BACTRIM DS,SEPTRA DS) 800-160 MG tablet Take 1 tablet by mouth 2 (two) times daily. For 26 days   11/19/2016 at Unknown time  . aspirin 81 MG tablet Take 81 mg by mouth daily.   Not Taking at Unknown time  . ceFAZolin (ANCEF) 2-4 GM/100ML-% IVPB Inject 100 mLs (2 g total) into the vein every 8 (eight) hours. (Patient not taking: Reported on 11/19/2016) 1 each 0 Completed Course at Unknown  time  . insulin aspart (NOVOLOG) 100 UNIT/ML injection Before each meal 3 times a day, 140-199 - 2 units, 200-250 - 4 units, 251-299 - 6 units,  300-349 - 8 units,  350 or above 10 units. Insulin PEN if approved, provide syringes and needles if needed. (Patient not taking: Reported on 11/19/2016) 10 mL 0 Not Taking at Unknown time  . insulin glargine (LANTUS) 100 UNIT/ML injection Inject 0.15 mLs (15 Units total) into the skin 2 (two) times daily. (Patient not taking: Reported on 11/19/2016) 10 mL 0 Not Taking at Unknown time  . nicotine (NICODERM CQ - DOSED IN MG/24 HOURS) 21 mg/24hr patch Place 1 patch (21 mg total) onto the skin daily. (Patient not taking: Reported on 11/19/2016) 28 patch 0 Not Taking at  Unknown time   Scheduled:  . [START ON 11/20/2016] carvedilol  6.25 mg Oral BID WC  . diltiazem  30 mg Oral Q6H  . [START ON 11/20/2016] levothyroxine  150 mcg Oral QAC breakfast  . [START ON 11/20/2016] lisinopril  10 mg Oral Daily  . nicotine  7 mg Transdermal Q24H  . sodium chloride flush  3 mL Intravenous Q12H   Infusions:  . ceFAZolin 2 g (11/19/16 2208)    Assessment: 81yo female with history of CAD, DM2, COPD, HTN and HLD presents with transient episode of aphasia. She was found to have new onset Afib. Pharmacy is consulted to dose heparin for atrial fibrillation.   Goal of Therapy:  Heparin level 0.3-0.7 units/ml Monitor platelets by anticoagulation protocol: Yes   Plan:  Give 4000 units bolus x 1 Start heparin infusion at 900 units/hr Check anti-Xa level in 8 hours and daily while on heparin Continue to monitor H&H and platelets  Arlean Hopping. Newman Pies, PharmD, BCPS Clinical Pharmacist [phone] 11/19/2016,10:39 PM

## 2016-11-19 NOTE — H&P (Signed)
[facility] Admission History and Physical Service Pager: [phone]  Patient name: Tara Flores Medical record number: [id number] Date of birth: 1936/03/28 Age: 81 y.o. Gender: female  Primary Care Provider: Clovia Cuff, MD Consultants: Cardiology Code Status: Full    Chief Complaint: Slur speech and altered mental status  Assessment and Plan: Tara Flores is a 81 y.o. female with a past medical history significant for T2DM, HTN,CAD, COPD, hypothyroidism, tobacco abuse presenting with slurred speech and altered mental status.  #Slurred speech/altered mental status  Patient was at home when nurse aid noticed change in mental status and new onset of slur speech which was also noted by family members. Upon arrival to the ED mental status and speech had returned to baseline. Patient's mentation is appropriate and is able to answer questions and participate in exam. Patient was found to be in atrial fibrillation. Symptoms were concerning for TIA,  Patient initially declined head CT but was eventually convinced  Patient also admit to not taking her Coreg for the past week. CHA2DS2-Vasc score is 6 making her high risk. Neurology was consulted and agree with current plan and will follow up if patient decide to get imaging.  --Admit to FMTS, admitting physician Dr.Walden --Place on Telemetry --Neuro check q4 --Follow up on head CT, and CTA Neck --Follow up on Carotid doppler --Follow up on Echo --Follow up on Neuro Consult, appreciate recs --Follow up on UDS --Follow  Up on TSH, A1c, Lipid panel  --Follow up on am CBc and BMP --Tylenol for pain  -- Hold blood pressure medication for permissive HTN  -- obtain RPR, B12 and folate with hx of recent falls   #Atrial fibrillation with RVR, new onset  Patient presented with AMS and slurred speech which resolved by the time of arrival into the ED. Patient is high risk for TIA with a CHA2DS2-Vasc of 6, recent foot  surgery and non adherence to Coreg for the past week. Initial troponin on admission was 0.14, concerning for some ischemia related to her arrhythmia. Patient initially refused Head CT and we were not able to anticoagulate patient. Patient was tachycardic into the 120's. Cardiology was consulted. Patient was hemodynamically stable.  --Follow up on Cards consult, appreciate recs  --Will resume Coreg 6.66m --Follow up on am EKG --Anticoagulation s/p head imaging  #CAD: 01/2015 cardiac cath noting triple vessel CAD with mild to moderate diffuse disease in the heavily calcified RCA and Circumflex system. Troponin elevated in the ED 0.14. EKG consistent with minimal ST elevation in V2/V3 along with afib, changed from pervious  - Trending troponin's x 3 and AM EKG  - Cards following    #Bilateral Lower Extremity Edema  Patient had significant bilateral lower extremity swelling with chronic skin changes consistent with PVD, though concern for CHF. On admission troponin was 0.14. last ECHO in 2016 with normal EF 60-65%, G1DD. Possible demand ischemia causing elevation in troponin. Chest Xray showed cardiomegaly and finding consistent with interstitial edema. Patient will need further evaluation. --Trend troponin x3 --Follow up on ECHO --Follow up on BNP -- lasix  20 mg IV  --Hold aspirin until imaging result available --Follow up on I/O --Weight check daily --Follow up on am EKG --Follow up on cardiology consult, appreciate recs  #Left foot amputation and venous stasis, chronic Patient recently had partial amputation of her left foot 2/2 to osteomyelitis in the setting of T2DM and peripheral neuropathy. Examination of left foot show adequately healing of her wound with  staples in place and no odor or drainage from the wound. Patient with bilateral erythema above her angle to mid shin, and warm to the touch. Skin changes are not concerning for cellulitis and more consistent with skin changes 2/2 venous  stasis. Lactic acid 1.72, no fever, chills an no leukocytosis WBcs 8.1. pateint is on Cefazolin course until 11/24/2016.  --Will continue Cefazolin 2g tid --Will continue to monitor --Will consider diuresis as needed.  #Hyponatremia Patient's sodium on admission was 129 most likely hypervolemic hyponatremia given significant edema noted in her lower extremities. Possible diagnosis of CHF, previous ECHO in 2016 with normal EF 60-65% with no significant valvular abnormalities and mild LV hypertrophy --Follow up on am BMP  -- diuresis  --Follow on ECHO --Monitor I/O  #T2DM, uncontrolled Last A1c was 8.4. Patient is on Lantus 15 U bid with Novolog before each meals.  --Start patient on Moderate Sliding Scale Insulin.  --Will follow up on A1c   #Hypertension, chronic On admission patient  Was normotensive 112/49. Patient continue to be within normal limits. --Hold lisinopril 10 mg daily --Continue to monitor vitals   # COPD/Asthma  Patient has a history of asthma and given her long history of smoking probably also a COPD component. Patient maintaining good oxygen saturation. No wheezing or chest tightness noted. --Will hold albuterol treatment use as needed --Monitor respiratory status  #Tobacco abuse, chronic Patient is currently smoking between 5-7 cigarettes a day but has extensive history about 50 years. Used to smoke a pack a day.Currently on Nicotine patch at home --Will prescribe 7 mg nicotine patch  #Hypothryoidism  TSH in 07/24/2016 was 8.57, repeat on 4/21 was 2.330. --Follow up on TSH   FEN/GI: Heart health/Carb modified Prophylaxis: None, will consider change after head imaginfg  Disposition: Family would like a palliative care consult to determine goals of care. Patient does not want any further intervention. Possible SNF per family request upon clinical improvement and work up completion  History of Present Illness:  Tara Flores is a 81 y.o. female with a past medical  history significant for T2DM, HTN,CAD, Asthma/COPD, hypothyroidism, tobacco abuse who presented to the ED with slurred speech and mild altered mental status. Patient's daughter report the nurse aid noticed this morning that patient appears to be confused and and had a slurred speech. After giving her shower, patient mental status improved, but still was still slurry. Patient was talking on the phone with a cousin who noticed slurred speech and intelligible words. Following conversation they decided to bring patient to the ED. By the time they arrive to the ED, patient had returned to baseline. Several weeks ago patient did have a fall episode. Last week, patient left the [facility] after her  Left foot surgery and apparently was supposed to have her medications mail to her, which she never receive among them Coreg. In the ED, patient refused CT Head and wanted to leave. Patient had a chest Xray which was concerning for interstitial edema and also showed some degree of  cardiomegaly. Patient was found to be in atrial fibrillation and troponin was 0.14 with a lactic acid of 1.72. Cardiology was consulted and FMTS was called for admission to teaching service.  Review Of Systems: Per HPI with the following additions:  Review of Systems  Constitutional: Negative for chills and fever.  HENT: Negative.   Eyes:       Patient is legally blind  Respiratory: Negative for cough, shortness of breath and  wheezing.   Cardiovascular: Positive for leg swelling. Negative for chest pain and palpitations.  Gastrointestinal: Negative for abdominal pain, nausea and vomiting.  Genitourinary: Negative for dysuria.  Musculoskeletal: Negative.   Skin: Negative.   Neurological: Positive for speech change. Negative for dizziness, loss of consciousness and headaches.  Endo/Heme/Allergies: Negative.   Psychiatric/Behavioral: Negative.     Patient Active Problem List   Diagnosis Date Noted  .  Diabetic foot ulcer ([place]) 10/10/2016  . Hypokalemia 10/10/2016  . Anemia due to chronic kidney disease 10/10/2016  . Cellulitis in diabetic foot ([place]) 10/09/2016  . Venous ulcer of left leg ([place]) 07/28/2016  . Dry skin dermatitis 07/28/2016  . Type 2 diabetes mellitus with hyperglycemia ([place]) 02/06/2015  . Hip fracture ([place]) 02/05/2015  . Closed right hip fracture ([place]) 02/05/2015  . Abnormal EKG 02/05/2015  . Tobacco abuse 02/05/2015  . DM2 (diabetes mellitus, type 2) ([place]) 02/05/2015  . Essential hypertension 02/05/2015  . Hypothyroidism 02/05/2015  . Coronary artery disease involving native coronary artery of native heart without angina pectoris     Past Medical History: Past Medical History:  Diagnosis Date  . Anxiety   . Arthritis   . Asthma   . CAD (coronary artery disease)   . Cardiac disease   . COPD (chronic obstructive pulmonary disease) ([place])   . Depression   . Diabetes mellitus without complication ([place])   . Hyperlipidemia   . Hypertension   . Hypothyroidism     Past Surgical History: Past Surgical History:  Procedure Laterality Date  . AMPUTATION Left 10/12/2016   Procedure: TRANSMETATARSAL AMPUTATION LEFT FOOT WITH ACHILLES TENDON LENGTHENING;  Surgeon: Edrick Kins, DPM;  Location: [facility];  Service: Podiatry;  Laterality: Left;  . APPENDECTOMY    . CARDIAC CATHETERIZATION N/A 02/05/2015   Procedure: Left Heart Cath and Coronary Angiography;  Surgeon: Burnell Blanks, MD;  Location: [facility];  Service: Cardiovascular;  Laterality: N/A;  . EYE SURGERY  2006   unsure if exact procedure  . HIP FRACTURE SURGERY    . INTRAMEDULLARY (IM) NAIL INTERTROCHANTERIC Right 02/06/2015   Procedure: INTRAMEDULLARY (IM) NAIL RIGHT HIP;  Surgeon: Leandrew Koyanagi, MD;  Location: [facility];  Service: Orthopedics;  Laterality: Right;  . JOINT REPLACEMENT     due to hip fracture    Social History: Social History  Substance Use Topics  . Smoking status: Current  Every Day Smoker    Packs/day: 1.00    Years: 63.00  . Smokeless tobacco: Never Used     Comment: has recently cut back, conts to cut back but repors she has a lot of excuses   . Alcohol use 0.0 oz/week     Comment: 3 times per month   Additional social history:   Please also refer to relevant sections of EMR.  Family History: Family History  Problem Relation Age of Onset  . Alcohol abuse Father   . Cancer Mother        lung  . Heart disease Mother   . Diabetes Sister   . Heart disease Sister    (If not completed, MUST add something in)  Allergies and Medications: Allergies  Allergen Reactions  . Brethine [Terbutaline]     Made patient confused  . Keflex [Cephalexin] Nausea Only    Loss of appetite   No current facility-administered medications on file prior to encounter.    Current Outpatient Prescriptions on File Prior to Encounter  Medication Sig Dispense Refill  .  ACCU-CHEK FASTCLIX LANCETS MISC Check blood sugar 2-3 times daily DX E11.9 300 each 3  . acetaminophen (TYLENOL) 325 MG tablet Take 2 tablets (650 mg total) by mouth every 6 (six) hours as needed for mild pain (or Fever >/= 101).    Marland Kitchen albuterol (PROVENTIL HFA;VENTOLIN HFA) 108 (90 Base) MCG/ACT inhaler Inhale 1 puff into the lungs every 6 (six) hours as needed for wheezing or shortness of breath. Reported on 09/06/2015 100 each 3  . albuterol (PROVENTIL) (2.5 MG/3ML) 0.083% nebulizer solution USE 1 VIAL VIA NEBULIZER  EVERY 6 HOURS AS NEEDED FOR WHEEZING OR SHORTNESS OF  BREATH. 1125 mL 1  . aspirin 81 MG tablet Take 81 mg by mouth daily.    . Blood Glucose Monitoring Suppl (ONETOUCH VERIO) w/Device KIT 1 kit by Does not apply route 3 (three) times daily. Use to test blood sugar three times daily. Dx:E11.9 1 kit 0  . carvedilol (COREG) 6.25 MG tablet Take 1 tablet (6.25 mg total) by mouth 2 (two) times daily with a meal. 180 tablet 3  . ceFAZolin (ANCEF) 2-4 GM/100ML-% IVPB Inject 100 mLs (2 g total) into the  vein every 8 (eight) hours. 1 each 0  . glucose blood (ONETOUCH VERIO) test strip Use to test blood sugar three times daily. Dx E11.9 300 each 3  . insulin aspart (NOVOLOG) 100 UNIT/ML injection Before each meal 3 times a day, 140-199 - 2 units, 200-250 - 4 units, 251-299 - 6 units,  300-349 - 8 units,  350 or above 10 units. Insulin PEN if approved, provide syringes and needles if needed. 10 mL 0  . insulin glargine (LANTUS) 100 UNIT/ML injection Inject 0.15 mLs (15 Units total) into the skin 2 (two) times daily. 10 mL 0  . levothyroxine (SYNTHROID, LEVOTHROID) 150 MCG tablet Take 150 mcg by mouth daily before breakfast.    . lisinopril (PRINIVIL,ZESTRIL) 10 MG tablet Take 1 tablet (10 mg total) by mouth daily. 90 tablet 0  . nicotine (NICODERM CQ - DOSED IN MG/24 HOURS) 21 mg/24hr patch Place 1 patch (21 mg total) onto the skin daily. 28 patch 0  . nitroGLYCERIN (NITROSTAT) 0.4 MG SL tablet Place 1 tablet (0.4 mg total) under the tongue every 5 (five) minutes as needed for chest pain. 25 tablet 3  . oxyCODONE-acetaminophen (PERCOCET/ROXICET) 5-325 MG tablet Take 1 tablet by mouth every 6 (six) hours as needed for severe pain. 15 tablet 0    Objective: BP 120/78   Pulse (!) 110   Temp 98.7 F (37.1 C) (Oral)   Resp 19   Ht 5' 4"  (1.626 m)   Wt 137 lb (62.1 kg)   SpO2 99%   BMI 23.52 kg/m   Physical Exam: General: NAD, able to participate in exam and answer questions. Cardiac: RRR, normal heart sounds, no murmurs. 2+ radial and PT pulses bilaterally Respiratory: CTAB, normal effort, No wheezes, rales or rhonchi Abdomen: soft, nontender, nondistended, no hepatic or splenomegaly, +BS Extremities: no edema or cyanosis. WWP. Left foot partial amputation Skin: Bilateral lower extremities erythema and swelling from mid shin to angle, warm, no lesions or weeping noted Neuro: alert and oriented x4, no focal deficits, 5/5 strength bilaterally upper and lower extremities and diminshed sensation  in the lower extremities Psych: Normal affect and mood   Labs and Imaging: CBC BMET   Recent Labs Lab 11/19/16 1435  WBC 8.1  HGB 11.3*  HCT 34.4*  PLT 351    Recent Labs Lab 11/19/16 1435  NA 129*  K 4.5  CL 97*  CO2 22  BUN 21*  CREATININE 0.71  GLUCOSE 89  CALCIUM 8.6*     Lactic Acid, Venous    Component Value Date/Time   LATICACIDVEN 1.07 11/19/2016 1824   Troponin (Point of Care Test)  Recent Labs  11/19/16 1443  TROPIPOC 0.14*   Dg Chest 2 View  Result Date: 11/19/2016 CLINICAL DATA:  81 year old female with chest pain slurred speech, altered mental status, confusion. EXAM: CHEST  2 VIEW COMPARISON:  10/11/2016 and earlier. Thoracic spine radiographs 12/24/2015. FINDINGS: Upright AP and lateral views of the chest. Stable cardiomegaly and mediastinal contours. Tortuous thoracic aorta with calcified atherosclerosis. Chronic hyperinflation. Chronic but increased streaky opacity at the left lung base. No pneumothorax. Acute on chronic pulmonary interstitial opacity. No definite pleural effusion. Osteopenia. New or increased lower thoracic versus L1 compression fracture since July 2017. There was mild superior endplate deformity at the same level on the prior study. Negative visible bowel gas pattern. IMPRESSION: 1. Acute on chronic increased pulmonary interstitial opacity and streaky left lower lobe opacity. Differential considerations include interstitial edema with atelectasis and viral/atypical respiratory infection. No definite pleural effusion. 2. Chronic compression fracture suspected at the thoracolumbar junction, but increased since July 2017. Osteopenia. 3. Chronic cardiomegaly and calcified aortic atherosclerosis. Electronically Signed   By: Genevie Ann M.D.   On: 11/19/2016 15:10    Marjie Skiff, MD 11/19/2016, 3:43 PM PGY-1, [place] Intern pager: [phone], text pages welcome   UPPER LEVEL ADDENDUM  I have read the above note  and made revisions highlighted in blue.  Kerrin Mo, MD, PGY-2 [facility]

## 2016-11-20 ENCOUNTER — Encounter (HOSPITAL_COMMUNITY): Payer: Self-pay

## 2016-11-20 ENCOUNTER — Other Ambulatory Visit (HOSPITAL_COMMUNITY): Payer: Medicare Other

## 2016-11-20 DIAGNOSIS — I129 Hypertensive chronic kidney disease with stage 1 through stage 4 chronic kidney disease, or unspecified chronic kidney disease: Secondary | ICD-10-CM | POA: Diagnosis present

## 2016-11-20 DIAGNOSIS — D631 Anemia in chronic kidney disease: Secondary | ICD-10-CM | POA: Diagnosis present

## 2016-11-20 DIAGNOSIS — I4891 Unspecified atrial fibrillation: Secondary | ICD-10-CM | POA: Diagnosis not present

## 2016-11-20 DIAGNOSIS — Z515 Encounter for palliative care: Secondary | ICD-10-CM

## 2016-11-20 DIAGNOSIS — Z7189 Other specified counseling: Secondary | ICD-10-CM | POA: Diagnosis not present

## 2016-11-20 DIAGNOSIS — I959 Hypotension, unspecified: Secondary | ICD-10-CM | POA: Diagnosis present

## 2016-11-20 DIAGNOSIS — J441 Chronic obstructive pulmonary disease with (acute) exacerbation: Secondary | ICD-10-CM | POA: Diagnosis not present

## 2016-11-20 DIAGNOSIS — E871 Hypo-osmolality and hyponatremia: Secondary | ICD-10-CM | POA: Diagnosis present

## 2016-11-20 DIAGNOSIS — E1169 Type 2 diabetes mellitus with other specified complication: Secondary | ICD-10-CM | POA: Diagnosis present

## 2016-11-20 DIAGNOSIS — E039 Hypothyroidism, unspecified: Secondary | ICD-10-CM | POA: Diagnosis present

## 2016-11-20 DIAGNOSIS — F17210 Nicotine dependence, cigarettes, uncomplicated: Secondary | ICD-10-CM | POA: Diagnosis present

## 2016-11-20 DIAGNOSIS — E785 Hyperlipidemia, unspecified: Secondary | ICD-10-CM | POA: Diagnosis present

## 2016-11-20 DIAGNOSIS — E1165 Type 2 diabetes mellitus with hyperglycemia: Secondary | ICD-10-CM | POA: Diagnosis present

## 2016-11-20 DIAGNOSIS — R4781 Slurred speech: Secondary | ICD-10-CM | POA: Diagnosis present

## 2016-11-20 DIAGNOSIS — I481 Persistent atrial fibrillation: Secondary | ICD-10-CM

## 2016-11-20 DIAGNOSIS — R4701 Aphasia: Secondary | ICD-10-CM | POA: Diagnosis present

## 2016-11-20 DIAGNOSIS — I2510 Atherosclerotic heart disease of native coronary artery without angina pectoris: Secondary | ICD-10-CM | POA: Diagnosis present

## 2016-11-20 DIAGNOSIS — Z66 Do not resuscitate: Secondary | ICD-10-CM | POA: Diagnosis present

## 2016-11-20 DIAGNOSIS — H548 Legal blindness, as defined in USA: Secondary | ICD-10-CM | POA: Diagnosis present

## 2016-11-20 DIAGNOSIS — J449 Chronic obstructive pulmonary disease, unspecified: Secondary | ICD-10-CM | POA: Diagnosis present

## 2016-11-20 DIAGNOSIS — I248 Other forms of acute ischemic heart disease: Secondary | ICD-10-CM | POA: Diagnosis present

## 2016-11-20 DIAGNOSIS — G459 Transient cerebral ischemic attack, unspecified: Secondary | ICD-10-CM | POA: Diagnosis present

## 2016-11-20 DIAGNOSIS — E1142 Type 2 diabetes mellitus with diabetic polyneuropathy: Secondary | ICD-10-CM | POA: Diagnosis present

## 2016-11-20 LAB — BASIC METABOLIC PANEL
ANION GAP: 8 (ref 5–15)
BUN: 20 mg/dL (ref 6–20)
CHLORIDE: 92 mmol/L — AB (ref 101–111)
CO2: 25 mmol/L (ref 22–32)
Calcium: 8.5 mg/dL — ABNORMAL LOW (ref 8.9–10.3)
Creatinine, Ser: 0.71 mg/dL (ref 0.44–1.00)
GFR calc Af Amer: >60 mL/min (ref >60)
GLUCOSE: 362 mg/dL — AB (ref 65–99)
POTASSIUM: 4.2 mmol/L (ref 3.5–5.1)
Sodium: 125 mmol/L — ABNORMAL LOW (ref 135–145)

## 2016-11-20 LAB — CBC
HEMATOCRIT: 31.5 % — AB (ref 36.0–46.0)
Hemoglobin: 10.3 g/dL — ABNORMAL LOW (ref 12.0–15.0)
MCH: 30.5 pg (ref 26.0–34.0)
MCHC: 32.7 g/dL (ref 30.0–36.0)
MCV: 93.2 fL (ref 78.0–100.0)
Platelets: 362 10*3/uL (ref 150–400)
RBC: 3.38 MIL/uL — AB (ref 3.87–5.11)
RDW: 14.4 % (ref 11.5–15.5)
WBC: 6.6 10*3/uL (ref 4.0–10.5)

## 2016-11-20 LAB — RAPID URINE DRUG SCREEN, HOSP PERFORMED
Amphetamines: NOT DETECTED
Barbiturates: NOT DETECTED
Benzodiazepines: POSITIVE — AB
COCAINE: NOT DETECTED
OPIATES: NOT DETECTED
TETRAHYDROCANNABINOL: NOT DETECTED

## 2016-11-20 LAB — FOLATE RBC
FOLATE, RBC: 1016 ng/mL (ref >498)
Folate, Hemolysate: 327.3 ng/mL
HEMATOCRIT: 32.2 % — AB (ref 34.0–46.6)

## 2016-11-20 LAB — TROPONIN I
TROPONIN I: 0.4 ng/mL — AB (ref <0.03)
TROPONIN I: 0.51 ng/mL — AB (ref <0.03)
Troponin I: 0.29 ng/mL (ref <0.03)

## 2016-11-20 LAB — GLUCOSE, CAPILLARY
GLUCOSE-CAPILLARY: 372 mg/dL — AB (ref 65–99)
GLUCOSE-CAPILLARY: 389 mg/dL — AB (ref 65–99)
GLUCOSE-CAPILLARY: 276 mg/dL — AB (ref 65–99)
Glucose-Capillary: 269 mg/dL — ABNORMAL HIGH (ref 65–99)
Glucose-Capillary: 372 mg/dL — ABNORMAL HIGH (ref 65–99)

## 2016-11-20 LAB — RPR: RPR Ser Ql: NONREACTIVE

## 2016-11-20 LAB — HEPARIN LEVEL (UNFRACTIONATED): Heparin Unfractionated: <0.1 IU/mL — ABNORMAL LOW (ref 0.30–0.70)

## 2016-11-20 LAB — HEMOGLOBIN A1C
HEMOGLOBIN A1C: 9 % — AB (ref 4.8–5.6)
MEAN PLASMA GLUCOSE: 212 mg/dL

## 2016-11-20 MED ORDER — ALPRAZOLAM 0.25 MG PO TABS
0.2500 mg | ORAL_TABLET | Freq: Two times a day (BID) | ORAL | Status: DC | PRN
  Administered 2016-11-20 – 2016-11-22 (×5): 0.25 mg via ORAL
  Filled 2016-11-20 (×5): qty 1

## 2016-11-20 MED ORDER — FUROSEMIDE 10 MG/ML IJ SOLN
20.0000 mg | Freq: Once | INTRAMUSCULAR | Status: AC
  Administered 2016-11-20: 20 mg via INTRAVENOUS
  Filled 2016-11-20: qty 4

## 2016-11-20 MED ORDER — HEPARIN (PORCINE) IN NACL 100-0.45 UNIT/ML-% IJ SOLN: 1100.0000 [IU]/h | INTRAMUSCULAR | Status: DC

## 2016-11-20 MED ORDER — APIXABAN 5 MG PO TABS
5.0000 mg | ORAL_TABLET | Freq: Two times a day (BID) | ORAL | Status: DC
  Administered 2016-11-20 – 2016-11-23 (×7): 5 mg via ORAL
  Filled 2016-11-20 (×7): qty 1

## 2016-11-20 MED ORDER — INSULIN GLARGINE 100 UNIT/ML ~~LOC~~ SOLN
15.0000 [IU] | Freq: Every day | SUBCUTANEOUS | Status: DC
  Administered 2016-11-20 – 2016-11-22 (×3): 15 [IU] via SUBCUTANEOUS
  Filled 2016-11-20 (×4): qty 0.15

## 2016-11-20 MED ORDER — SULFAMETHOXAZOLE-TRIMETHOPRIM 400-80 MG PO TABS
1.0000 | ORAL_TABLET | Freq: Two times a day (BID) | ORAL | Status: DC
  Filled 2016-11-20: qty 1

## 2016-11-20 MED ORDER — SULFAMETHOXAZOLE-TRIMETHOPRIM 800-160 MG PO TABS
1.0000 | ORAL_TABLET | Freq: Two times a day (BID) | ORAL | Status: DC
  Administered 2016-11-20 – 2016-11-23 (×7): 1 via ORAL
  Filled 2016-11-20 (×7): qty 1

## 2016-11-20 MED ORDER — HEPARIN (PORCINE) IN NACL 100-0.45 UNIT/ML-% IJ SOLN
INTRAMUSCULAR | Status: AC
  Filled 2016-11-20: qty 250

## 2016-11-20 MED ORDER — ALBUTEROL SULFATE (2.5 MG/3ML) 0.083% IN NEBU: 3.0000 mL | INHALATION_SOLUTION | Freq: Four times a day (QID) | RESPIRATORY_TRACT | Status: DC | PRN

## 2016-11-20 MED ORDER — HEPARIN BOLUS VIA INFUSION
2000.0000 [IU] | Freq: Once | INTRAVENOUS | Status: DC
  Filled 2016-11-20: qty 2000

## 2016-11-20 MED ORDER — INSULIN ASPART 100 UNIT/ML ~~LOC~~ SOLN
0.0000 [IU] | Freq: Three times a day (TID) | SUBCUTANEOUS | Status: DC
  Administered 2016-11-20 (×3): 9 [IU] via SUBCUTANEOUS

## 2016-11-20 NOTE — Progress Notes (Signed)
Stopped by to visit w/ pt, but she was asleep. Will stop back by another time.   11/20/16 1500  Clinical Encounter Type  Visited With Patient not available  Visit Type Initial;Psychological support;Spiritual support;Social support  Referral From Chaplain   Ephraim Hamburgerynthia A Robertlee Rogacki, [address]

## 2016-11-20 NOTE — Care Management CC44 (Signed)
Condition Code 44 Documentation Completed  Patient Details  Name: Tara PoeBetty L Flores MRN: [id number] Date of Birth: 01/07/1936   Condition Code 44 given:  Yes Patient signature on Condition Code 44 notice:  Yes Documentation of 2 MD's agreement:  Yes Code 44 added to claim:  Yes    Kermit BaloKelli F Khai Torbert, RN 11/20/2016, 4:46 PM

## 2016-11-20 NOTE — Discharge Summary (Signed)
Family Medicine [facility] Discharge Summary  Patient name: Tara PoeBetty L Worrel Medical record number: [id number] Date of birth: 08/22/1935 Age: 81 y.o. Gender: female Date of Admission: 11/19/2016  Date of Discharge: 11/23/2016 Admitting Physician: Tobey GrimJeffrey H Walden, MD  Primary Care Provider: Annita BrodAsenso, Philip, MD Consultants: Cardiology, Neurology, Palliative  Indication for Hospitalization: New onset Afib RVR  Discharge Diagnoses/Problem List:  Atrial fibrillation Hyponatremia CAD Pulmonary nodule L foot s/p amputation and chronic venous stasis T2DM Hypertension COPD/Asthma Tobacco abuse Hypothyroidism  Disposition: SNF  Discharge Condition: Stable, improved  Discharge Exam:  General: NAD Cardiovascular: irregularly irregular, no murmurs appreciated Respiratory: CTAB, normal effort, No wheezes, rales or rhonchi Abdomen: soft, nontender, nondistended, +BS Extremities: Left foot s/p partial amputation. Bilateral lower extremities erythema and 1+ edema from mid shin to ankle, warm, no lesions or weeping noted.  Brief Hospital Course:  Tara Flores is a 81 y.o. female with PMH significant for T2DM, HTN,CAD, COPD, hypothyroidism, tobacco abuse who presented with slurred speech and altered mental status that was resolved on admission and was found to be tachycardic to 120s. She was found to be in Afib with RVR, cardiology was consulted and patient was started on cardizem for rate control. She had an increase in her troponins to 0.51 likely due to demand but patient denied further work up. Neurology was consulted for the AMS out of concern for possible TIA but given return to mental baseline and negative CT head/CTA neck imaging no further workup was recommended. A pulmonary nodule was seen on the CTA neck and patient was counseled but she declined further work up. She voiced multiple times throughout her hospital stay that she would not want aggressive procedures so palliative  was consulted. Palliative medicine felt that patient was clearly depressed though did not have suicidal ideation. They voiced concern over patient's decision making capacity, and recommended psych consult. Patient seen by psych on day of discharge, who felt she did have decision making capacity. It was recommended by PT/OT that patient go to SNF, however she refused and was discharged home with home health, a wheelchair, and ambulatory referral to palliative care.   Issues for Follow Up:  1. Patient recommended to follow-up with palliative care outpatient. Case management was to have scheduled this appointment prior to patient's discharge. Please ensure this appointment was scheduled and patient attended.  2. Patient started on Eliquis 5mg  BID and diltiazem qd during admission.   3. Coreg and lisinopril were discontinued during admission.   Significant Procedures: None  Significant Labs and Imaging:   Recent Labs Lab 11/20/16 0936 11/21/16 0235 11/22/16 0534  WBC 6.6 8.1 6.9  HGB 10.3* 10.5* 10.2*  HCT 31.5* 32.0* 30.9*  PLT 362 361 389    Recent Labs Lab 11/19/16 1435 11/20/16 0936 11/21/16 0235 11/21/16 1233 11/22/16 0534  NA 129* 125* 123* 124* 128*  K 4.5 4.2 5.0 4.2 4.1  CL 97* 92* 88* 92* 93*  CO2 22 25 24 25 27   GLUCOSE 89 362* 493* 322* 246*  BUN 21* 20 22* 19 17  CREATININE 0.71 0.71 0.78 0.68 0.62  CALCIUM 8.6* 8.5* 8.6* 8.8* 8.6*  ALKPHOS 120  --   --   --   --   AST 17  --   --   --   --   ALT 10*  --   --   --   --   ALBUMIN 2.8*  --   --   --   --  Results/Tests Pending at Time of Discharge: None  Discharge Medications:  Allergies as of 11/23/2016      Reactions   Brethine [terbutaline]    Made patient confused   Keflex [cephalexin] Nausea Only   Loss of appetite      Medication List    STOP taking these medications   carvedilol 6.25 MG tablet Commonly known as:  COREG   ceFAZolin 2-4 GM/100ML-% IVPB Commonly known as:  ANCEF   lisinopril  10 MG tablet Commonly known as:  PRINIVIL,ZESTRIL   nicotine 21 mg/24hr patch Commonly known as:  NICODERM CQ - dosed in mg/24 hours     TAKE these medications   acetaminophen 325 MG tablet Commonly known as:  TYLENOL Take 2 tablets (650 mg total) by mouth every 6 (six) hours as needed for mild pain (or Fever >/= 101).   albuterol 108 (90 Base) MCG/ACT inhaler Commonly known as:  PROVENTIL HFA;VENTOLIN HFA Inhale 1 puff into the lungs every 6 (six) hours as needed for wheezing or shortness of breath. Reported on 09/06/2015   albuterol (2.5 MG/3ML) 0.083% nebulizer solution Commonly known as:  PROVENTIL USE 1 VIAL VIA NEBULIZER  EVERY 6 HOURS AS NEEDED FOR WHEEZING OR SHORTNESS OF  BREATH.   apixaban 5 MG Tabs tablet Commonly known as:  ELIQUIS Take 1 tablet (5 mg total) by mouth 2 (two) times daily.   aspirin 81 MG tablet Take 81 mg by mouth daily.   diltiazem 120 MG 24 hr capsule Commonly known as:  CARDIZEM CD Take 1 capsule (120 mg total) by mouth daily at 6 PM.   insulin aspart 100 UNIT/ML injection Commonly known as:  NOVOLOG Before each meal 3 times a day, 140-199 - 2 units, 200-250 - 4 units, 251-299 - 6 units,  300-349 - 8 units,  350 or above 10 units. Insulin PEN if approved, provide syringes and needles if needed.   insulin glargine 100 UNIT/ML injection Commonly known as:  LANTUS Inject 0.15 mLs (15 Units total) into the skin 2 (two) times daily.   insulin NPH Human 100 UNIT/ML injection Commonly known as:  HUMULIN N,NOVOLIN N Inject 12-25 Units into the skin 2 (two) times daily. 25 units in the morning and 12 units bedtime   levothyroxine 150 MCG tablet Commonly known as:  SYNTHROID, LEVOTHROID Take 150 mcg by mouth daily before breakfast.   nitroGLYCERIN 0.4 MG SL tablet Commonly known as:  NITROSTAT Place 1 tablet (0.4 mg total) under the tongue every 5 (five) minutes as needed for chest pain.   oxyCODONE-acetaminophen 5-325 MG tablet Commonly known  as:  PERCOCET/ROXICET Take 1 tablet by mouth every 6 (six) hours as needed for severe pain.   sulfamethoxazole-trimethoprim 800-160 MG tablet Commonly known as:  BACTRIM DS,SEPTRA DS Take 1 tablet by mouth 2 (two) times daily. For 26 days            Durable Medical Equipment        Start     Ordered   11/23/16 1558  For home use only DME standard manual wheelchair with seat cushion  Once    Comments:  Patient suffers from weakness which impairs their ability to perform daily activities like bathing, dressing, feeding, grooming and toileting in the home.  A cane, crutch or walker will not resolve  issue with performing activities of daily living. A wheelchair will allow patient to safely perform daily activities. Patient can safely propel the wheelchair in the home or has a caregiver who  can provide assistance.  Accessories: elevating leg rests (ELRs), wheel locks, extensions and anti-tippers.   11/23/16 1558      Discharge Instructions: Please refer to Patient Instructions section of EMR for full details.  Patient was counseled important signs and symptoms that should prompt return to medical care, changes in medications, dietary instructions, activity restrictions, and follow up appointments.   Follow-Up Appointments:  Contact information for follow-up providers    Annita Brod, MD. Schedule an appointment as soon as possible for a visit.   Specialty:  Internal Medicine Why:  For hospital follow-up within the next week Contact information: [address] [place] [address] [place] [address] [phone]            Contact information for after-discharge care    Destination    [facility] Follow up.   Specialty:  Skilled Nursing Facility Contact information: [address] [place] [address] [phone]                  Marquette Saa, MD 11/23/2016, 4:13 PM PGY-2, [facility]

## 2016-11-20 NOTE — Progress Notes (Signed)
ANTICOAGULATION CONSULT NOTE -Follow up Pharmacy Consult for Heparin Indication: atrial fibrillation  Allergies  Allergen Reactions  . Brethine [Terbutaline]     Made patient confused  . Keflex [Cephalexin] Nausea Only    Loss of appetite    Patient Measurements: Height: 5\' 4"  (162.6 cm) Weight: 137 lb (62.1 kg) IBW/kg (Calculated) : 54.7 Heparin Dosing Weight: 62 kg  Vital Signs: Temp: 98.1 F (36.7 C) (06/01 1000) Temp Source: Oral (06/01 1000) BP: 107/50 (06/01 1000) Pulse Rate: 99 (06/01 1000)  Labs:  Recent Labs  11/19/16 1435 11/19/16 1939 11/20/16 0024 11/20/16 0936  HGB 11.3*  --   --  10.3*  HCT 34.4*  --   --  31.5*  PLT 351  --   --  362  LABPROT 13.1  --   --   --   INR 0.99  --   --   --   HEPARINUNFRC  --   --   --  <0.10*  CREATININE 0.71  --   --  0.71  TROPONINI  --  0.36* 0.51*  --     Estimated Creatinine Clearance: 49.2 mL/min (by C-G formula based on SCr of 0.71 mg/dL).   Medical History: Past Medical History:  Diagnosis Date  . Anxiety   . Arthritis   . Asthma   . CAD (coronary artery disease)   . Cardiac disease   . COPD (chronic obstructive pulmonary disease) (HCC)   . Depression   . Diabetes mellitus without complication (HCC)   . Hyperlipidemia   . Hypertension   . Hypothyroidism     Medications:  Prescriptions Prior to Admission  Medication Sig Dispense Refill Last Dose  . acetaminophen (TYLENOL) 325 MG tablet Take 2 tablets (650 mg total) by mouth every 6 (six) hours as needed for mild pain (or Fever >/= 101).   11/19/2016 at Unknown time  . albuterol (PROVENTIL HFA;VENTOLIN HFA) 108 (90 Base) MCG/ACT inhaler Inhale 1 puff into the lungs every 6 (six) hours as needed for wheezing or shortness of breath. Reported on 09/06/2015 100 each 3 Past Week at Unknown time  . albuterol (PROVENTIL) (2.5 MG/3ML) 0.083% nebulizer solution USE 1 VIAL VIA NEBULIZER  EVERY 6 HOURS AS NEEDED FOR WHEEZING OR SHORTNESS OF  BREATH. 1125 mL 1  Past Week at Unknown time  . carvedilol (COREG) 6.25 MG tablet Take 1 tablet (6.25 mg total) by mouth 2 (two) times daily with a meal. 180 tablet 3 Past Week at Unknown time  . insulin NPH Human (HUMULIN N,NOVOLIN N) 100 UNIT/ML injection Inject 12-25 Units into the skin 2 (two) times daily. 25 units in the morning and 12 units bedtime   11/19/2016 at am  . levothyroxine (SYNTHROID, LEVOTHROID) 150 MCG tablet Take 150 mcg by mouth daily before breakfast.   11/19/2016 at Unknown time  . lisinopril (PRINIVIL,ZESTRIL) 10 MG tablet Take 1 tablet (10 mg total) by mouth daily. 90 tablet 0 11/19/2016 at Unknown time  . nitroGLYCERIN (NITROSTAT) 0.4 MG SL tablet Place 1 tablet (0.4 mg total) under the tongue every 5 (five) minutes as needed for chest pain. 25 tablet 3  at prn  . oxyCODONE-acetaminophen (PERCOCET/ROXICET) 5-325 MG tablet Take 1 tablet by mouth every 6 (six) hours as needed for severe pain. 15 tablet 0  at prn  . sulfamethoxazole-trimethoprim (BACTRIM DS,SEPTRA DS) 800-160 MG tablet Take 1 tablet by mouth 2 (two) times daily. For 26 days   11/19/2016 at Unknown time  . aspirin 81 MG tablet  Take 81 mg by mouth daily.   Not Taking at Unknown time  . ceFAZolin (ANCEF) 2-4 GM/100ML-% IVPB Inject 100 mLs (2 g total) into the vein every 8 (eight) hours. (Patient not taking: Reported on 11/19/2016) 1 each 0 Completed Course at Unknown time  . insulin aspart (NOVOLOG) 100 UNIT/ML injection Before each meal 3 times a day, 140-199 - 2 units, 200-250 - 4 units, 251-299 - 6 units,  300-349 - 8 units,  350 or above 10 units. Insulin PEN if approved, provide syringes and needles if needed. (Patient not taking: Reported on 11/19/2016) 10 mL 0 Not Taking at Unknown time  . insulin glargine (LANTUS) 100 UNIT/ML injection Inject 0.15 mLs (15 Units total) into the skin 2 (two) times daily. (Patient not taking: Reported on 11/19/2016) 10 mL 0 Not Taking at Unknown time  . nicotine (NICODERM CQ - DOSED IN MG/24 HOURS) 21  mg/24hr patch Place 1 patch (21 mg total) onto the skin daily. (Patient not taking: Reported on 11/19/2016) 28 patch 0 Not Taking at Unknown time   Scheduled:  . diltiazem  30 mg Oral Q6H  . furosemide  20 mg Intravenous Once  . insulin aspart  0-9 Units Subcutaneous TID WC  . insulin glargine  15 Units Subcutaneous QHS  . levothyroxine  150 mcg Oral QAC breakfast  . nicotine  7 mg Transdermal Q24H  . sodium chloride flush  3 mL Intravenous Q12H  . sulfamethoxazole-trimethoprim  1 tablet Oral Q12H   Infusions:  . heparin 900 Units/hr (11/19/16 2335)    Assessment: 81yo female with history of CAD, DM2, COPD, HTN and HLD presents with transient episode of aphasia. She was found to have new onset Afib on admit 11/19/16. She was started on IV heparin for atrial fibrillation per pharmacy protocol.  Initial ~9 hour heparin level = <0.1 on 900 units/hr IV heparin.   No complications with IV line/heparin infusion per RN and no bleeding noted per RN MD notes this AM that slurred speech resolved- likely TIA, CT head negative.  Neuro says pt needs anticoagulation. Cardiololgy planning to transition from heparin to Eliquis 5mg  BID at some point- no orders for Eliquis at this time.  Echo pending    Goal of Therapy:  Heparin level 0.3-0.7 units/ml Monitor platelets by anticoagulation protocol: Yes   Plan:  Bolus heparin 2000 units IV x1 Increase heparin drip to 1100 units/hr Check heparin level in 8 hours Daily heparin level and CBC  Noah Delaineuth Gitty Osterlund, RPh Clinical Pharmacist Pager: [phone] Main Pharmacy [phone]  11/20/2016,11:55 AM

## 2016-11-20 NOTE — Progress Notes (Signed)
Progress Note  Patient Name: Tara Flores Date of Encounter: 11/20/2016  Primary Cardiologist: New  Subjective   Denies CP or dyspnea  Inpatient Medications    Scheduled Meds: . carvedilol  6.25 mg Oral BID WC  . diltiazem  30 mg Oral Q6H  . insulin aspart  0-9 Units Subcutaneous TID WC  . levothyroxine  150 mcg Oral QAC breakfast  . lisinopril  10 mg Oral Daily  . nicotine  7 mg Transdermal Q24H  . sodium chloride flush  3 mL Intravenous Q12H   Continuous Infusions: . ceFAZolin Stopped (11/20/16 0656)  . heparin 900 Units/hr (11/19/16 2335)   PRN Meds: acetaminophen **OR** acetaminophen   Vital Signs    Vitals:   11/20/16 0257 11/20/16 0456 11/20/16 0711 11/20/16 0805  BP: 120/75 105/61 117/75 (!) 97/58  Pulse: (!) 109 (!) 106 99 74  Resp: 20 20 20 20   Temp: 98.7 F (37.1 C) 98.3 F (36.8 C) 97.8 F (36.6 C) 97.6 F (36.4 C)  TempSrc: Oral Oral Oral Oral  SpO2: 98% 95% 99% 99%  Weight:      Height:       No intake or output data in the 24 hours ending 11/20/16 0841 Filed Weights   11/19/16 1334  Weight: 62.1 kg (137 lb)    Telemetry    Atrial fibrillation; rate improved- Personally Reviewed   Physical Exam   GEN: No acute distress.  Frail Neck: No JVD Cardiac: irregular Respiratory: Clear to auscultation bilaterally. GI: Soft, nontender, non-distended  MS: trace edema; s/p partial amputation left foot; eyrthema Neuro:  Nonfocal  Psych: Normal affect   Labs    Chemistry Recent Labs Lab 11/19/16 1435  NA 129*  K 4.5  CL 97*  CO2 22  GLUCOSE 89  BUN 21*  CREATININE 0.71  CALCIUM 8.6*  PROT 7.3  ALBUMIN 2.8*  AST 17  ALT 10*  ALKPHOS 120  BILITOT 0.8  GFRNONAA >60  GFRAA >60  ANIONGAP 10     Hematology Recent Labs Lab 11/19/16 1435  WBC 8.1  RBC 3.72*  HGB 11.3*  HCT 34.4*  MCV 92.5  MCH 30.4  MCHC 32.8  RDW 14.1  PLT 351    Cardiac Enzymes Recent Labs Lab 11/19/16 1939 11/20/16 0024  TROPONINI 0.36*  0.51*    Recent Labs Lab 11/19/16 1443  TROPIPOC 0.14*     BNP Recent Labs Lab 11/19/16 1939  BNP 464.5*     DDimer No results for input(s): DDIMER in the last 168 hours.   Radiology    Dg Chest 2 View  Result Date: 11/19/2016 CLINICAL DATA:  81 year old female with chest pain slurred speech, altered mental status, confusion. EXAM: CHEST  2 VIEW COMPARISON:  10/11/2016 and earlier. Thoracic spine radiographs 12/24/2015. FINDINGS: Upright AP and lateral views of the chest. Stable cardiomegaly and mediastinal contours. Tortuous thoracic aorta with calcified atherosclerosis. Chronic hyperinflation. Chronic but increased streaky opacity at the left lung base. No pneumothorax. Acute on chronic pulmonary interstitial opacity. No definite pleural effusion. Osteopenia. New or increased lower thoracic versus L1 compression fracture since July 2017. There was mild superior endplate deformity at the same level on the prior study. Negative visible bowel gas pattern. IMPRESSION: 1. Acute on chronic increased pulmonary interstitial opacity and streaky left lower lobe opacity. Differential considerations include interstitial edema with atelectasis and viral/atypical respiratory infection. No definite pleural effusion. 2. Chronic compression fracture suspected at the thoracolumbar junction, but increased since July 2017. Osteopenia. 3.  Chronic cardiomegaly and calcified aortic atherosclerosis. Electronically Signed   By: Odessa Fleming M.D.   On: 11/19/2016 15:10   Ct Head Wo Contrast  Result Date: 11/19/2016 CLINICAL DATA:  INITIAL EVALUATION FOR ACUTE ALTERED MENTAL STATUS, SLURRED SPEECH, NOW RESOLVED. EXAM: CT HEAD WITHOUT CONTRAST CT ANGIOGRAPHY NECK TECHNIQUE: Multidetector CT imaging of the neck was performed using the standard protocol during bolus administration of intravenous contrast. Multiplanar CT image reconstructions and MIPs were obtained to evaluate the vascular anatomy. Carotid stenosis  measurements (when applicable) are obtained utilizing NASCET criteria, using the distal internal carotid diameter as the denominator. CONTRAST:  50 CC OF ISOVUE 370. COMPARISON:  Prior head CT from 02/05/2015. FINDINGS: CT HEAD FINDINGS: Brain: Moderate cerebral atrophy with chronic microvascular ischemic disease. Small remote lacunar infarct present within the left lentiform nucleus. No acute intracranial hemorrhage. No evidence for acute large vessel territory infarct. No mass lesion, midline shift or mass effect. Mild ventricular prominence related to global parenchymal volume loss without hydrocephalus. No extra-axial fluid collection. Vascular: No hyperdense vessel. Intracranial atherosclerosis noted within the carotid siphons and distal vertebral arteries. Skull: Scalp soft tissues within normal limits.  Calvarium intact. Sinuses/Orbits: Globes and orbital soft tissues within normal limits. Patient status post lens extraction bilaterally. Mild mucosal thickening within the ethmoidal air cells and left maxillary sinus. Paranasal sinuses are otherwise clear. No mastoid effusion. CTA NECK FINDINGS: Aortic arch: Visualized aortic arch of normal caliber with normal branch pattern. Calcified atheromatous plaque about the origin of the great vessels without flow-limiting stenosis. Atheromatous plaque also noted within the proximal descending intrathoracic aorta. Visualized subclavian artery is patent without flow-limiting stenosis. Right carotid system: Scattered atheromatous plaque within the right common carotid artery without significant stenosis. Calcified plaque about the right carotid bifurcation/proximal right ICA with moderate narrowing of approximately 50% by NASCET criteria. Distally, right ICA patent to the circle of Willis without flow-limiting stenosis. Right ICA medialized into the retropharyngeal space. Atheromatous plaque noted within the carotid siphons without high-grade stenosis. Left carotid  system: Mild scattered plaque present within left common carotid artery without flow-limiting stenosis. Eccentric calcified plaque about the left carotid bifurcation/proximal left ICA with associated narrowing of up to 40-50% by NASCET criteria. Distally, left ICA patent to the circle of Willis without flow-limiting stenosis. Left ICA is medialized into the retropharyngeal space. Moderate atheromatous plaque within the carotid siphons without high-grade stenosis Vertebral arteries: Both of the vertebral arteries arise from the subclavian arteries vertebral arteries tortuous bilaterally. Left vertebral artery dominant. Vertebral arteries widely patent within the neck without stenosis or occlusion. Skeleton: No acute osseous abnormality. No worrisome lytic or blastic osseous lesions. Reversal the normal cervical lordosis with grade 1 anterolisthesis of C3 on C4 and C4 on C5. Advanced degenerate spondylolysis at C5-6 and C6-7. Prominent facet arthrosis present within the upper cervical spine. Other neck: No acute soft tissue abnormality identified within the neck. Thyroid appears to be absent. Salivary glands within normal limits. Upper chest: Scattered pretracheal and precarinal lymph nodes measure up to 15 mm. subcarinal node measures 17 mm. Findings are indeterminate, but may be reactive. Small amount of layering secretions noted within the subglottic trachea. Mild centrilobular emphysema. Subtle interlobular septal thickening suggests mild pulmonary interstitial edema. 4 mm subpleural nodule noted within the peripheral left upper lobe (series 3, image 93). Visualized lungs are otherwise clear. IMPRESSION: CT HEAD IMPRESSION: 1. No acute intracranial process identified. 2. Moderate cerebral atrophy with chronic small vessel ischemic disease. CTA NECK IMPRESSION: 1. Atheromatous stenosis  about the carotid bifurcations bilaterally with associated stenosis of approximately 40-50% by NASCET criteria, right slightly  worse than left. 2. Widely patent vertebral arteries within the neck. 3. Diffuse vessel tortuosity, suggesting chronic underlying hypertension. 4. Enlarged mediastinal adenopathy as above, indeterminate, but may be reactive in nature. 5. Mild diffuse interlobular septal thickening within the visualized lungs, suggesting mild pulmonary interstitial edema. 6. Mild upper lobe predominant centrilobular emphysema. 7. 4 mm left upper lobe pulmonary nodule, indeterminate. No follow-up needed if patient is low-risk. Non-contrast chest CT can be considered in 12 months if patient is high-risk. This recommendation follows the consensus statement: Guidelines for Management of Incidental Pulmonary Nodules Detected on CT Images: From the Fleischner Society 2017; Radiology 2017; 284:228-243. Electronically Signed   By: Rise MuBenjamin  McClintock M.D.   On: 11/19/2016 22:56   Ct Angio Neck W Or Wo Contrast  Result Date: 11/19/2016 CLINICAL DATA:  INITIAL EVALUATION FOR ACUTE ALTERED MENTAL STATUS, SLURRED SPEECH, NOW RESOLVED. EXAM: CT HEAD WITHOUT CONTRAST CT ANGIOGRAPHY NECK TECHNIQUE: Multidetector CT imaging of the neck was performed using the standard protocol during bolus administration of intravenous contrast. Multiplanar CT image reconstructions and MIPs were obtained to evaluate the vascular anatomy. Carotid stenosis measurements (when applicable) are obtained utilizing NASCET criteria, using the distal internal carotid diameter as the denominator. CONTRAST:  50 CC OF ISOVUE 370. COMPARISON:  Prior head CT from 02/05/2015. FINDINGS: CT HEAD FINDINGS: Brain: Moderate cerebral atrophy with chronic microvascular ischemic disease. Small remote lacunar infarct present within the left lentiform nucleus. No acute intracranial hemorrhage. No evidence for acute large vessel territory infarct. No mass lesion, midline shift or mass effect. Mild ventricular prominence related to global parenchymal volume loss without hydrocephalus. No  extra-axial fluid collection. Vascular: No hyperdense vessel. Intracranial atherosclerosis noted within the carotid siphons and distal vertebral arteries. Skull: Scalp soft tissues within normal limits.  Calvarium intact. Sinuses/Orbits: Globes and orbital soft tissues within normal limits. Patient status post lens extraction bilaterally. Mild mucosal thickening within the ethmoidal air cells and left maxillary sinus. Paranasal sinuses are otherwise clear. No mastoid effusion. CTA NECK FINDINGS: Aortic arch: Visualized aortic arch of normal caliber with normal branch pattern. Calcified atheromatous plaque about the origin of the great vessels without flow-limiting stenosis. Atheromatous plaque also noted within the proximal descending intrathoracic aorta. Visualized subclavian artery is patent without flow-limiting stenosis. Right carotid system: Scattered atheromatous plaque within the right common carotid artery without significant stenosis. Calcified plaque about the right carotid bifurcation/proximal right ICA with moderate narrowing of approximately 50% by NASCET criteria. Distally, right ICA patent to the circle of Willis without flow-limiting stenosis. Right ICA medialized into the retropharyngeal space. Atheromatous plaque noted within the carotid siphons without high-grade stenosis. Left carotid system: Mild scattered plaque present within left common carotid artery without flow-limiting stenosis. Eccentric calcified plaque about the left carotid bifurcation/proximal left ICA with associated narrowing of up to 40-50% by NASCET criteria. Distally, left ICA patent to the circle of Willis without flow-limiting stenosis. Left ICA is medialized into the retropharyngeal space. Moderate atheromatous plaque within the carotid siphons without high-grade stenosis Vertebral arteries: Both of the vertebral arteries arise from the subclavian arteries vertebral arteries tortuous bilaterally. Left vertebral artery  dominant. Vertebral arteries widely patent within the neck without stenosis or occlusion. Skeleton: No acute osseous abnormality. No worrisome lytic or blastic osseous lesions. Reversal the normal cervical lordosis with grade 1 anterolisthesis of C3 on C4 and C4 on C5. Advanced degenerate spondylolysis at C5-6 and C6-7. Prominent  facet arthrosis present within the upper cervical spine. Other neck: No acute soft tissue abnormality identified within the neck. Thyroid appears to be absent. Salivary glands within normal limits. Upper chest: Scattered pretracheal and precarinal lymph nodes measure up to 15 mm. subcarinal node measures 17 mm. Findings are indeterminate, but may be reactive. Small amount of layering secretions noted within the subglottic trachea. Mild centrilobular emphysema. Subtle interlobular septal thickening suggests mild pulmonary interstitial edema. 4 mm subpleural nodule noted within the peripheral left upper lobe (series 3, image 93). Visualized lungs are otherwise clear. IMPRESSION: CT HEAD IMPRESSION: 1. No acute intracranial process identified. 2. Moderate cerebral atrophy with chronic small vessel ischemic disease. CTA NECK IMPRESSION: 1. Atheromatous stenosis about the carotid bifurcations bilaterally with associated stenosis of approximately 40-50% by NASCET criteria, right slightly worse than left. 2. Widely patent vertebral arteries within the neck. 3. Diffuse vessel tortuosity, suggesting chronic underlying hypertension. 4. Enlarged mediastinal adenopathy as above, indeterminate, but may be reactive in nature. 5. Mild diffuse interlobular septal thickening within the visualized lungs, suggesting mild pulmonary interstitial edema. 6. Mild upper lobe predominant centrilobular emphysema. 7. 4 mm left upper lobe pulmonary nodule, indeterminate. No follow-up needed if patient is low-risk. Non-contrast chest CT can be considered in 12 months if patient is high-risk. This recommendation follows  the consensus statement: Guidelines for Management of Incidental Pulmonary Nodules Detected on CT Images: From the Fleischner Society 2017; Radiology 2017; 284:228-243. Electronically Signed   By: Rise Mu M.D.   On: 11/19/2016 22:56   Dg Foot Complete Left  Result Date: 11/18/2016 Please see detailed radiograph report in office note.     Patient Profile     81 y.o. female admitted with new onset atrial fibrillation and probable TIA. Also with past medical history of moderate coronary artery disease, diabetes mellitus, hypertension, hyperlipidemia, COPD and recent partial left foot amputation for osteomyelitis.  Assessment & Plan    1 new onset atrial fibrillation-patient remains in atrial fibrillation this morning. Would continue Cardizem for rate control and increase as needed. Await echocardiogram and TSH. CHADSvasc 6. Would transition from IV heparin to apixaban 5 mg BID. She is at elevated risk for anticoagulation as outlined in history of present illness but is also at high risk for embolic event particularly with recent TIA. Would like to try anticoagulation if possible. Best option is likely rate control and anticoagulation as she does not want procedures.  2 hypertension-blood pressure is borderline. Discontinue carvedilol and lisinopril to allow increased dose of Cardizem for atrial fibrillation.  3 elevated troponin-likely secondary to atrial fibrillation. She has not had chest pain. She does not want invasive cardiac procedures. No plans for further ischemia evaluation.  4Lung nodule-  follow-up for primary care.   Signed, Olga Millers, MD  11/20/2016, 8:41 AM

## 2016-11-20 NOTE — Progress Notes (Signed)
OT Cancellation Note  Patient Details Name: Tara Flores MRN: [id number] DOB: 05/04/1936   Cancelled Treatment:    Reason Eval/Treat Not Completed: Other (comment). Pt declined. Will return later after lunch.   [facility],HILLARY  Couper [place], OT/L  [phone] 11/20/2016 11/20/2016, 12:25 PM

## 2016-11-20 NOTE — Progress Notes (Signed)
Noted no hemorrhage on CT and anticoagulation started. Neurology to sign off. Please call with further questions or concerns.   Ritta SlotMcNeill Prescott Truex, MD [facility] [phone]  If 7pm- 7am, please page neurology on call as listed in AMION.

## 2016-11-20 NOTE — Care Management Obs Status (Signed)
MEDICARE OBSERVATION STATUS NOTIFICATION   Patient Details  Name: Tara Flores MRN: [id number] Date of Birth: 09/25/1935   Medicare Observation Status Notification Given:  Yes    Kermit BaloKelli F Kiaira Pointer, RN 11/20/2016, 4:46 PM

## 2016-11-20 NOTE — Consult Note (Addendum)
   [facility]   11/20/2016  Sherle PoeBetty L Flores 10/21/1935 [id number]    Made aware of hospitalization. [facility]. Please see chart review tab then encounter/notes for further patient outreach details [facility].  Chart reviewed. Noted Palliative Medicine consult. Also Psychiatry consult pending for capacity.   Will continue to follow along and update [facility]. Left voicemail for inpatient RNCM to make aware [facility].   Also made aware that Dr. Jacqulynn CadetAseno, patient's new Primary Care MD, [facility] not [facility].    AddendumMicah Flores: Went to bedside to visit with patient and family. Family not present during bedside visit. Will follow up at later time. Left updated voicemail for inpatient RNCM to make aware that [facility] continue to follow post discharge due [facility].     Raiford NobleAtika Hall, MSN-Ed, RN,BSN [facility] Hospital Liaison [phone]

## 2016-11-20 NOTE — Progress Notes (Signed)
Occupational Therapy Evaluation Patient Details Name: Tara Flores MRN: [id number] DOB: Jul 21, 1935 Today's Date: 11/20/2016    History of Present Illness 81 y.o. female  with past medical history of HTN, CAD, COPD, DM2 uncontrolled, hypothyroidism, legally blind, and prior osteomyelitis with partial left foot amputation on 10/12/16 presented to the ED from home with slurred speech and AMS. On arrival to the ED she was back to her baseline. Work-up revealed new onset A.Fib with RVR   Clinical Impression   PTA, pt living with a room mate and was functioning primarily at w/c level and was modified independent with ADL and mobility. Pt states she had HHPT and an aide 2x/wk. At this time feel pt would benefit from rehab at a SNF to maximize functional level of independence and reduce risk of falls. Pt states she wants to DC home, but unsure where "home" would be at this time. Feel pt needs 24/7 S. Will follow acutely to address established goals and facilitate DC to next venue of care.     Follow Up Recommendations  SNF;Supervision/Assistance - 24 hour    Equipment Recommendations  Other (comment) (TBA)    Recommendations for Other Services       Precautions / Restrictions Precautions Precautions: Fall;Other (comment) (recent L transmet amputation - unsure of WBS) Required Braces or Orthoses: Other Brace/Splint Other Brace/Splint: Cam boot Restrictions Other Position/Activity Restrictions: unsure - per pt she is to wear the boot when walking but does not know how to put it on (she has had it for over a month)      Mobility Bed Mobility               General bed mobility comments: OOB in chair  Transfers Overall transfer level: Needs assistance Equipment used: Rolling walker (2 wheeled) Transfers: Sit to/from [employer] Sit to Stand: Min guard Stand pivot transfers: Min guard            Balance Overall balance assessment: Needs  assistance Sitting-balance support: Feet supported Sitting balance-Leahy Scale: Good       Standing balance-Leahy Scale: Fair                             ADL either performed or assessed with clinical judgement   ADL Overall ADL's : Needs assistance/impaired     Grooming: Set up;Sitting   Upper Body Bathing: Set up;Sitting   Lower Body Bathing: Min guard;Sit to/from stand   Upper Body Dressing : Set up;Sitting   Lower Body Dressing: Minimal assistance;Sit to/from stand Lower Body Dressing Details (indicate cue type and reason): Max A to [employer], which she has had for over a month Toilet Transfer: Minimal assistance;BSC;Stand-pivot   Toileting- Clothing Manipulation and Hygiene: Minimal assistance Toileting - Clothing Manipulation Details (indicate cue type and reason): limited by visual impaiments. Asking therapist if she was "clean"     Functional mobility during ADLs: Rolling walker;Cueing for safety;Min guard General ADL Comments: Pt performing task with min A at times - most likely related to visual impairment and being in an unfamiliar environment     Vision Baseline Vision/History: Legally blind       Perception     Praxis      Pertinent Vitals/Pain Pain Assessment: No/denies pain     Hand Dominance Right   Extremity/Trunk Assessment Upper Extremity Assessment Upper Extremity Assessment: Overall WFL for tasks assessed   Lower Extremity Assessment Lower Extremity Assessment:  Defer to PT evaluation   Cervical / Trunk Assessment Cervical / Trunk Assessment: Kyphotic   Communication Communication Communication: No difficulties   Cognition Arousal/Alertness: Awake/alert Behavior During Therapy: Anxious Overall Cognitive Status: Difficult to assess                                 General Comments: Son/daughter in law present during evaluation. Pt alert and oriented. Pt unable to recall why she was in the hospital.  Appears to have tangential conversation, however, son interrupting throughout. After son left, pt states "they just want to put me somewhere" "they don't want me to live with them"   General Comments       Exercises     Shoulder Instructions      Home Living Family/patient expects to be discharged to:: Unsure                                        Prior Functioning/Environment Level of Independence: Needs assistance  Gait / Transfers Assistance Needed: ambulated with RW with PT and short distances. Primarily using a w/c ADL's / Homemaking Assistance Needed: states she does her own ADL but has aide 2x/wk            OT Problem List: Decreased activity tolerance;Decreased strength;Impaired balance (sitting and/or standing);Impaired vision/perception;Decreased safety awareness;Decreased cognition      OT Treatment/Interventions: Self-care/ADL training;Therapeutic exercise;DME and/or AE instruction;Therapeutic activities;Cognitive remediation/compensation;Visual/perceptual remediation/compensation;Patient/family education;Balance training    OT Goals(Current goals can be found in the care plan section) Acute Rehab OT Goals Patient Stated Goal: to go home OT Goal Formulation: With patient Time For Goal Achievement: 12/04/16 Potential to Achieve Goals: Good ADL Goals Pt Will Perform Lower Body Bathing: with modified independence;sit to/from stand Pt Will Perform Lower Body Dressing: with modified independence;sit to/from stand Pt Will Transfer to Toilet: with modified independence;bedside commode Pt Will Perform Toileting - Clothing Manipulation and hygiene: with modified independence;sit to/from stand Additional ADL Goal #1: Pt will verbalize 3 strategies to reduce risk of falls with min vc  OT Frequency: Min 2X/week   Barriers to D/C: Decreased caregiver support          Co-evaluation PT/OT/SLP Co-Evaluation/Treatment: Yes Reason for Co-Treatment: Necessary  to address cognition/behavior during functional activity   OT goals addressed during session: ADL's and self-care      AM-PAC PT "6 Clicks" Daily Activity     Outcome Measure Help from another person eating meals?: A Little Help from another person taking care of personal grooming?: A Little Help from another person toileting, which includes using toliet, bedpan, or urinal?: A Little Help from another person bathing (including washing, rinsing, drying)?: A Little Help from another person to put on and taking off regular upper body clothing?: A Little Help from another person to put on and taking off regular lower body clothing?: A Little 6 Click Score: 18   End of Session Equipment Utilized During Treatment: Gait belt;Rolling walker;Other (comment) (Cam boot - L) Nurse Communication: Mobility status  Activity Tolerance: Patient tolerated treatment well Patient left: in chair;with call bell/phone within reach;with chair alarm set;with nursing/sitter in room  OT Visit Diagnosis: Unsteadiness on feet (R26.81);Low vision, both eyes (H54.2);Other symptoms and signs involving cognitive function                Time: [id number] OT  Time Calculation (min): 27 min Charges:  OT General Charges $OT Visit: 1 Procedure OT Evaluation $OT Eval Moderate Complexity: 1 Procedure G-Codes: OT G-codes **NOT FOR INPATIENT CLASS** Functional Assessment Tool Used: Clinical judgement Functional Limitation: Self care Self Care Current Status (Z6109): At least 1 percent but less than 20 percent impaired, limited or restricted Self Care Goal Status (U0454): At least 1 percent but less than 20 percent impaired, limited or restricted   [facility], OT/L  [phone] 11/20/2016  Hristopher Missildine,HILLARY 11/20/2016, 2:47 PM

## 2016-11-20 NOTE — Evaluation (Signed)
Physical Therapy Evaluation Patient Details Name: Tara Flores MRN: [id number] DOB: 02/17/1936 Today's Date: 11/20/2016   History of Present Illness  81 y.o. female  with past medical history of HTN, CAD, COPD, DM2 uncontrolled, hypothyroidism, legally blind, and prior osteomyelitis with partial left foot amputation on 10/12/16 presented to the ED from home with slurred speech and AMS. On arrival to the ED she was back to her baseline. Work-up revealed new onset A.Fib with RVR  Clinical Impression  Pt presented sitting OOB in recliner chair, awake and willing to participate in therapy session. Prior to admission, pt was using a w/c mostly for mobility. She stated that she could perform transfers with mod I and ambulated short distances with RW when PT was working with her at home (3x/week). Based on pt's performance during the evaluation, PT currently recommending pt d/c to SNF to maximize functional level of independence and reduce the risk of falls. Pt would continue to benefit from skilled physical therapy services at this time while admitted and after d/c to address the below listed limitations in order to improve overall safety and independence with functional mobility.      Follow Up Recommendations SNF;Supervision/Assistance - 24 hour    Equipment Recommendations  None recommended by PT    Recommendations for Other Services       Precautions / Restrictions Precautions Precautions: Fall;Other (comment) (recent L transmet amputation, unclear of WB'ing status) Required Braces or Orthoses: Other Brace/Splint Other Brace/Splint: Cam boot Restrictions Other Position/Activity Restrictions: unsure - per pt she is to wear the boot when walking but does not know how to put it on (she has had it for over a month). pt also stating that she does not wear the CAM boot much at home (only when PT is working with her)      Mobility  Bed Mobility               General bed mobility  comments: OOB in chair  Transfers Overall transfer level: Needs assistance Equipment used: Rolling walker (2 wheeled) Transfers: Sit to/from Stand Sit to Stand: Min guard Stand pivot transfers: Min guard       General transfer comment: min guard for safety, good technique  Ambulation/Gait Ambulation/Gait assistance: Min guard Ambulation Distance (Feet): 15 Feet Assistive device: Rolling walker (2 wheeled) Gait Pattern/deviations: Step-through pattern;Decreased step length - right;Decreased step length - left;Decreased stride length;Trunk flexed Gait velocity: decreased Gait velocity interpretation: Below normal speed for age/gender General Gait Details: pt ambulated with CAM boot on, mild instability but no LOB or need for physical assistance, min guard for safety  Stairs            Wheelchair Mobility    Modified Rankin (Stroke Patients Only)       Balance Overall balance assessment: Needs assistance Sitting-balance support: Feet supported Sitting balance-Leahy Scale: Good     Standing balance support: During functional activity Standing balance-Leahy Scale: Fair                               Pertinent Vitals/Pain Pain Assessment: No/denies pain    Home Living Family/patient expects to be discharged to:: Unsure                 Additional Comments: pt and pt's family arguing during session regarding living arrangements upon d/c    Prior Function Level of Independence: Needs assistance   Gait / Transfers Assistance Needed:  ambulated with RW with PT and short distances. Primarily using a w/c  ADL's / Homemaking Assistance Needed: states she does her own ADL but has aide 2x/wk        Hand Dominance   Dominant Hand: Right    Extremity/Trunk Assessment   Upper Extremity Assessment Upper Extremity Assessment: Defer to OT evaluation    Lower Extremity Assessment Lower Extremity Assessment: Generalized weakness    Cervical /  Trunk Assessment Cervical / Trunk Assessment: Kyphotic  Communication   Communication: No difficulties  Cognition Arousal/Alertness: Awake/alert Behavior During Therapy: Anxious Overall Cognitive Status: Difficult to assess                                 General Comments: Son/daughter in law present during evaluation. Pt alert and oriented. Pt unable to recall why she was in the hospital. Appears to have tangential conversation, however, son interrupting throughout. After son left, pt states "they just want to put me somewhere" "they don't want me to live with them"      General Comments      Exercises     Assessment/Plan    PT Assessment Patient needs continued PT services  PT Problem List Decreased strength;Decreased activity tolerance;Decreased balance;Decreased mobility;Decreased coordination;Decreased knowledge of use of DME;Decreased safety awareness;Decreased knowledge of precautions       PT Treatment Interventions DME instruction;Gait training;Stair training;Functional mobility training;Therapeutic activities;Therapeutic exercise;Balance training;Neuromuscular re-education;Patient/family education    PT Goals (Current goals can be found in the Care Plan section)  Acute Rehab PT Goals Patient Stated Goal: to go home PT Goal Formulation: With patient Time For Goal Achievement: 12/04/16 Potential to Achieve Goals: Good    Frequency Min 3X/week   Barriers to discharge Decreased caregiver support      Co-evaluation PT/OT/SLP Co-Evaluation/Treatment: Yes Reason for Co-Treatment: For patient/therapist safety;To address functional/ADL transfers PT goals addressed during session: Mobility/safety with mobility;Balance;Proper use of DME;Strengthening/ROM OT goals addressed during session: ADL's and self-care       AM-PAC PT "6 Clicks" Daily Activity  Outcome Measure Difficulty turning over in bed (including adjusting bedclothes, sheets and blankets)?: A  Little Difficulty moving from lying on back to sitting on the side of the bed? : A Little Difficulty sitting down on and standing up from a chair with arms (e.g., wheelchair, bedside commode, etc,.)?: A Little Help needed moving to and from a bed to chair (including a wheelchair)?: A Little Help needed walking in hospital room?: A Little Help needed climbing 3-5 steps with a railing? : A Lot 6 Click Score: 17    End of Session Equipment Utilized During Treatment: Gait belt;Other (comment) (CAM boot) Activity Tolerance: Patient tolerated treatment well Patient left: in chair;with call bell/phone within reach;with chair alarm set Nurse Communication: Mobility status PT Visit Diagnosis: Other abnormalities of gait and mobility (R26.89)    Time: 1610-9604 PT Time Calculation (min) (ACUTE ONLY): 22 min   Charges:   PT Evaluation $PT Eval Moderate Complexity: 1 Procedure     PT G Codes:   PT G-Codes **NOT FOR INPATIENT CLASS** Functional Assessment Tool Used: AM-PAC 6 Clicks Basic Mobility;Clinical judgement Functional Limitation: Mobility: Walking and moving around Mobility: Walking and Moving Around Current Status (V4098): At least 40 percent but less than 60 percent impaired, limited or restricted Mobility: Walking and Moving Around Goal Status [id number]): At least 1 percent but less than 20 percent impaired, limited or restricted  [place], [place], [place] [phone]   Alessandra Bevels Netha Dafoe 11/20/2016, 3:13 PM

## 2016-11-20 NOTE — Progress Notes (Signed)
Family Medicine Teaching Service Daily Progress Note Intern Pager: [phone]  Patient name: Tara Flores Medical record number: [id number] Date of birth: [birth year] Age: 81 y.o. Gender: female  Primary Care Provider: Annita Brod, MD Consultants: Cardiology, Neurology (signed off) Code Status: DNR  Pt Overview and Major Events to Date:  5/31 admitted for transient speech difficult in the setting of new onset Afib  Assessment and Plan: Tara Flores is a 81 y.o. female with a past medical history significant for T2DM, HTN,CAD, COPD, hypothyroidism, tobacco abuse presenting with slurred speech and altered mental status.  Atrial fibrillation with RVR, new onset. Now rate controlled, still in Afib on am EKG. CHA2DS2-Vasc 6.  Troponin 0.14>0.35>0.51 likely 2/2 demand. Patient does not want procedures. - Per Cardiology, continue cardiazem for rate control and increase as needed. Transition from heparin to Eliquis 5mg  BID - Trending troponin - Echo pending - Discontinued coreg and lisinopril  Hyponatremia likely 2/2 fluid overload:  Na 129 >125 most likely hypervolemic hyponatremia given significant edema noted in her lower extremities. Some concern for CHF, CXR showed cardiomegaly and interstitial edema. Upper lung fields seen on CTA neck also showing mild pulmonary interstitial edema. Last ECHO in 2016 with normal EF 60-65%, G1DD.  - lasix IV 20mg  x1 - Echo pending - Strict I/Os, daily weights - Cardiology following - Monitor BMP  CAD: 01/2015 cardiac cath noting triple vessel CAD with mild to moderate diffuse disease in the heavily calcified RCA and Circumflex system. - Trending troponin - Cards following, see above for recommendations  Slurred speech/altered mental status, resolved. Initially concerning for TIA, returned to mental baseline. CT head no acute intracranial process and CTA neck with atheromatous stenosis 40-50%. - Carotid doppler, Echo pending - Per neurology, needs  anticoagulation. Signed off. -Tylenol for pain   Pulmonary nodule 4 mm left upper lobe pulmonary nodule seen on upper lung field on CTA neck. Patient is active smoker and does not want further work up. - Palliative consult - Consider CT chest pending GOC discussion  Left foot amputation and venous stasis, chronic Recent partial amputation of L foot 2/2 osteomyelitis. Was on cefazolin then transitioned to bactrim as outpatient to complete 4 week course on 11/24/16 - Bactrim q12h - Monitor - PT/OT consult  T2DM, uncontrolled. A1c 9.0. At home on Patient is on Lantus 15 U bid and SSI Novolog before each meals. Am CBG 372 - Moderate Sliding Scale Insulin.  - restart lantus at 15U qhs - monitor CBGs  Hypertension, chronic BP borderline low at 97/58 this am - Discontinued coreg and lisinopril. On cardizem for afib - Continue to monitor vitals   COPD/Asthma   - home albuterol q6prn - Monitor respiratory status  Tobacco abuse, chronic.  - 7 mg nicotine patch - Counseling on smoking cessation  Hypothyroidism TSH 0.926 - continue home levothyroxine  FEN/GI: heart healthy/carb modified diet PPx: eliquis  Disposition: pending PT/OT eval and palliative recs  Subjective:  Feels well this morning and wanting to talk with palliative about her GOC. She ideally would like to leave the hospital today.  Objective: Temp:  [97.8 F (36.6 C)-98.7 F (37.1 C)] 98.3 F (36.8 C) (06/01 0456) Pulse Rate:  [87-141] 106 (06/01 0456) Resp:  [17-33] 20 (06/01 0456) BP: (96-125)/(49-93) 105/61 (06/01 0456) SpO2:  [87 %-100 %] 95 % (06/01 0456) Weight:  [137 lb (62.1 kg)] 137 lb (62.1 kg) (05/31 1334) Physical Exam: General: NAD, sitting up in chair Cardiovascular: irregularly irregular, no murmurs. No JVD Respiratory:  CTAB, normal effort, No wheezes, rales or rhonchi Abdomen: soft, nontender, nondistended, +BS Extremities: Left foot s/p partial amputation. Bilateral lower extremities  erythema and 1+ edema  from mid shin to angle, warm, no lesions or weeping noted.  Laboratory:  Recent Labs Lab 11/19/16 1435  WBC 8.1  HGB 11.3*  HCT 34.4*  PLT 351    Recent Labs Lab 11/19/16 1435  NA 129*  K 4.5  CL 97*  CO2 22  BUN 21*  CREATININE 0.71  CALCIUM 8.6*  PROT 7.3  BILITOT 0.8  ALKPHOS 120  ALT 10*  AST 17  GLUCOSE 89   BNP 464.5  Troponins 0.36 -> 0.51    Imaging/Diagnostic Tests: Dg Chest 2 View  Result Date: 11/19/2016 CLINICAL DATA:  81 year old female with chest pain slurred speech, altered mental status, confusion. EXAM: CHEST  2 VIEW COMPARISON:  10/11/2016 and earlier. Thoracic spine radiographs 12/24/2015. FINDINGS: Upright AP and lateral views of the chest. Stable cardiomegaly and mediastinal contours. Tortuous thoracic aorta with calcified atherosclerosis. Chronic hyperinflation. Chronic but increased streaky opacity at the left lung base. No pneumothorax. Acute on chronic pulmonary interstitial opacity. No definite pleural effusion. Osteopenia. New or increased lower thoracic versus L1 compression fracture since July 2017. There was mild superior endplate deformity at the same level on the prior study. Negative visible bowel gas pattern. IMPRESSION: 1. Acute on chronic increased pulmonary interstitial opacity and streaky left lower lobe opacity. Differential considerations include interstitial edema with atelectasis and viral/atypical respiratory infection. No definite pleural effusion. 2. Chronic compression fracture suspected at the thoracolumbar junction, but increased since July 2017. Osteopenia. 3. Chronic cardiomegaly and calcified aortic atherosclerosis. Electronically Signed   By: Odessa Fleming M.D.   On: 11/19/2016 15:10   Ct Head Wo Contrast  Result Date: 11/19/2016 CLINICAL DATA:  INITIAL EVALUATION FOR ACUTE ALTERED MENTAL STATUS, SLURRED SPEECH, NOW RESOLVED. EXAM: CT HEAD WITHOUT CONTRAST CT ANGIOGRAPHY NECK TECHNIQUE: Multidetector CT  imaging of the neck was performed using the standard protocol during bolus administration of intravenous contrast. Multiplanar CT image reconstructions and MIPs were obtained to evaluate the vascular anatomy. Carotid stenosis measurements (when applicable) are obtained utilizing NASCET criteria, using the distal internal carotid diameter as the denominator. CONTRAST:  50 CC OF ISOVUE 370. COMPARISON:  Prior head CT from 02/05/2015. FINDINGS: CT HEAD FINDINGS: Brain: Moderate cerebral atrophy with chronic microvascular ischemic disease. Small remote lacunar infarct present within the left lentiform nucleus. No acute intracranial hemorrhage. No evidence for acute large vessel territory infarct. No mass lesion, midline shift or mass effect. Mild ventricular prominence related to global parenchymal volume loss without hydrocephalus. No extra-axial fluid collection. Vascular: No hyperdense vessel. Intracranial atherosclerosis noted within the carotid siphons and distal vertebral arteries. Skull: Scalp soft tissues within normal limits.  Calvarium intact. Sinuses/Orbits: Globes and orbital soft tissues within normal limits. Patient status post lens extraction bilaterally. Mild mucosal thickening within the ethmoidal air cells and left maxillary sinus. Paranasal sinuses are otherwise clear. No mastoid effusion. CTA NECK FINDINGS: Aortic arch: Visualized aortic arch of normal caliber with normal branch pattern. Calcified atheromatous plaque about the origin of the great vessels without flow-limiting stenosis. Atheromatous plaque also noted within the proximal descending intrathoracic aorta. Visualized subclavian artery is patent without flow-limiting stenosis. Right carotid system: Scattered atheromatous plaque within the right common carotid artery without significant stenosis. Calcified plaque about the right carotid bifurcation/proximal right ICA with moderate narrowing of approximately 50% by NASCET criteria. Distally,  right ICA patent to the  circle of Willis without flow-limiting stenosis. Right ICA medialized into the retropharyngeal space. Atheromatous plaque noted within the carotid siphons without high-grade stenosis. Left carotid system: Mild scattered plaque present within left common carotid artery without flow-limiting stenosis. Eccentric calcified plaque about the left carotid bifurcation/proximal left ICA with associated narrowing of up to 40-50% by NASCET criteria. Distally, left ICA patent to the circle of Willis without flow-limiting stenosis. Left ICA is medialized into the retropharyngeal space. Moderate atheromatous plaque within the carotid siphons without high-grade stenosis Vertebral arteries: Both of the vertebral arteries arise from the subclavian arteries vertebral arteries tortuous bilaterally. Left vertebral artery dominant. Vertebral arteries widely patent within the neck without stenosis or occlusion. Skeleton: No acute osseous abnormality. No worrisome lytic or blastic osseous lesions. Reversal the normal cervical lordosis with grade 1 anterolisthesis of C3 on C4 and C4 on C5. Advanced degenerate spondylolysis at C5-6 and C6-7. Prominent facet arthrosis present within the upper cervical spine. Other neck: No acute soft tissue abnormality identified within the neck. Thyroid appears to be absent. Salivary glands within normal limits. Upper chest: Scattered pretracheal and precarinal lymph nodes measure up to 15 mm. subcarinal node measures 17 mm. Findings are indeterminate, but may be reactive. Small amount of layering secretions noted within the subglottic trachea. Mild centrilobular emphysema. Subtle interlobular septal thickening suggests mild pulmonary interstitial edema. 4 mm subpleural nodule noted within the peripheral left upper lobe (series 3, image 93). Visualized lungs are otherwise clear. IMPRESSION: CT HEAD IMPRESSION: 1. No acute intracranial process identified. 2. Moderate cerebral atrophy  with chronic small vessel ischemic disease. CTA NECK IMPRESSION: 1. Atheromatous stenosis about the carotid bifurcations bilaterally with associated stenosis of approximately 40-50% by NASCET criteria, right slightly worse than left. 2. Widely patent vertebral arteries within the neck. 3. Diffuse vessel tortuosity, suggesting chronic underlying hypertension. 4. Enlarged mediastinal adenopathy as above, indeterminate, but may be reactive in nature. 5. Mild diffuse interlobular septal thickening within the visualized lungs, suggesting mild pulmonary interstitial edema. 6. Mild upper lobe predominant centrilobular emphysema. 7. 4 mm left upper lobe pulmonary nodule, indeterminate. No follow-up needed if patient is low-risk. Non-contrast chest CT can be considered in 12 months if patient is high-risk. This recommendation follows the consensus statement: Guidelines for Management of Incidental Pulmonary Nodules Detected on CT Images: From the Fleischner Society 2017; Radiology 2017; 284:228-243. Electronically Signed   By: Rise MuBenjamin  McClintock M.D.   On: 11/19/2016 22:56   Ct Angio Neck W Or Wo Contrast  Result Date: 11/19/2016 CLINICAL DATA:  INITIAL EVALUATION FOR ACUTE ALTERED MENTAL STATUS, SLURRED SPEECH, NOW RESOLVED. EXAM: CT HEAD WITHOUT CONTRAST CT ANGIOGRAPHY NECK TECHNIQUE: Multidetector CT imaging of the neck was performed using the standard protocol during bolus administration of intravenous contrast. Multiplanar CT image reconstructions and MIPs were obtained to evaluate the vascular anatomy. Carotid stenosis measurements (when applicable) are obtained utilizing NASCET criteria, using the distal internal carotid diameter as the denominator. CONTRAST:  50 CC OF ISOVUE 370. COMPARISON:  Prior head CT from 02/05/2015. FINDINGS: CT HEAD FINDINGS: Brain: Moderate cerebral atrophy with chronic microvascular ischemic disease. Small remote lacunar infarct present within the left lentiform nucleus. No acute  intracranial hemorrhage. No evidence for acute large vessel territory infarct. No mass lesion, midline shift or mass effect. Mild ventricular prominence related to global parenchymal volume loss without hydrocephalus. No extra-axial fluid collection. Vascular: No hyperdense vessel. Intracranial atherosclerosis noted within the carotid siphons and distal vertebral arteries. Skull: Scalp soft tissues within normal limits.  Calvarium  intact. Sinuses/Orbits: Globes and orbital soft tissues within normal limits. Patient status post lens extraction bilaterally. Mild mucosal thickening within the ethmoidal air cells and left maxillary sinus. Paranasal sinuses are otherwise clear. No mastoid effusion. CTA NECK FINDINGS: Aortic arch: Visualized aortic arch of normal caliber with normal branch pattern. Calcified atheromatous plaque about the origin of the great vessels without flow-limiting stenosis. Atheromatous plaque also noted within the proximal descending intrathoracic aorta. Visualized subclavian artery is patent without flow-limiting stenosis. Right carotid system: Scattered atheromatous plaque within the right common carotid artery without significant stenosis. Calcified plaque about the right carotid bifurcation/proximal right ICA with moderate narrowing of approximately 50% by NASCET criteria. Distally, right ICA patent to the circle of Willis without flow-limiting stenosis. Right ICA medialized into the retropharyngeal space. Atheromatous plaque noted within the carotid siphons without high-grade stenosis. Left carotid system: Mild scattered plaque present within left common carotid artery without flow-limiting stenosis. Eccentric calcified plaque about the left carotid bifurcation/proximal left ICA with associated narrowing of up to 40-50% by NASCET criteria. Distally, left ICA patent to the circle of Willis without flow-limiting stenosis. Left ICA is medialized into the retropharyngeal space. Moderate  atheromatous plaque within the carotid siphons without high-grade stenosis Vertebral arteries: Both of the vertebral arteries arise from the subclavian arteries vertebral arteries tortuous bilaterally. Left vertebral artery dominant. Vertebral arteries widely patent within the neck without stenosis or occlusion. Skeleton: No acute osseous abnormality. No worrisome lytic or blastic osseous lesions. Reversal the normal cervical lordosis with grade 1 anterolisthesis of C3 on C4 and C4 on C5. Advanced degenerate spondylolysis at C5-6 and C6-7. Prominent facet arthrosis present within the upper cervical spine. Other neck: No acute soft tissue abnormality identified within the neck. Thyroid appears to be absent. Salivary glands within normal limits. Upper chest: Scattered pretracheal and precarinal lymph nodes measure up to 15 mm. subcarinal node measures 17 mm. Findings are indeterminate, but may be reactive. Small amount of layering secretions noted within the subglottic trachea. Mild centrilobular emphysema. Subtle interlobular septal thickening suggests mild pulmonary interstitial edema. 4 mm subpleural nodule noted within the peripheral left upper lobe (series 3, image 93). Visualized lungs are otherwise clear. IMPRESSION: CT HEAD IMPRESSION: 1. No acute intracranial process identified. 2. Moderate cerebral atrophy with chronic small vessel ischemic disease. CTA NECK IMPRESSION: 1. Atheromatous stenosis about the carotid bifurcations bilaterally with associated stenosis of approximately 40-50% by NASCET criteria, right slightly worse than left. 2. Widely patent vertebral arteries within the neck. 3. Diffuse vessel tortuosity, suggesting chronic underlying hypertension. 4. Enlarged mediastinal adenopathy as above, indeterminate, but may be reactive in nature. 5. Mild diffuse interlobular septal thickening within the visualized lungs, suggesting mild pulmonary interstitial edema. 6. Mild upper lobe predominant  centrilobular emphysema. 7. 4 mm left upper lobe pulmonary nodule, indeterminate. No follow-up needed if patient is low-risk. Non-contrast chest CT can be considered in 12 months if patient is high-risk. This recommendation follows the consensus statement: Guidelines for Management of Incidental Pulmonary Nodules Detected on CT Images: From the Fleischner Society 2017; Radiology 2017; 284:228-243. Electronically Signed   By: Rise Mu M.D.   On: 11/19/2016 22:56    Leland Her, DO 11/20/2016, 6:49 AM PGY-1, [facility] FPTS Intern pager: [phone], text pages welcome

## 2016-11-20 NOTE — Consult Note (Signed)
Consultation Note Date: 11/20/2016   Patient Name: Tara Flores  DOB: July 29, 1935  MRN: [id number]  Age / Sex: 81 y.o., female  PCP: Clovia Cuff, MD Referring Physician: No att. providers found  Reason for Consultation: Establishing goals of care  HPI/Patient Profile: 81 y.o. female  with past medical history of HTN, CAD, COPD, DM2 uncontrolled, hypothyroidism, legally blind, and prior osteomyelitis with partial left foot amputation on 10/12/16. She presented to the ED from home with slurred speech and AMS. On arrival to the ED she was back to her baseline. She was admitted on 11/19/2016. Work-up revealed new onset A.Fib with RVR. Head CT negative for hemorrhage. Neurology recommending anticoagulation. Cardiology recommending rate control and anticoagulation. Pt declines invasive procedures. Family requested palliative consult to help clarify goals of care.   Clinical Assessment and Goals of Care: I met with Mrs. Depaoli's family outside of the room, per their request. In attendance was her two daughters, one granddaughter, one of her sons, and the son's wife. They provided detailed reports of Mrs. Tofte's baseline behavior and personality since they were children. They explained she was very manipulative for attention, and this behavior had worsened as her children became more independent. As her health has declined, they are also concerned she is intermittently faking symptoms for attention. They do believe she has depression (likely chronic and situational), and relayed that she had been expressing her readiness for death and her lack of value as a person. They are very concerned that she is not rational in her decision making.   In terms of this hospitalization, they understand that Mrs. Nass has been reticent to receive care and is insistent on discharge as soon as possible (she told me very strongly today that she was leaving tomorrow regardless of the  recommendation). Historically, she has chosen to avoid medical care as much as possible, and uses denial as a means to cope with changing health. They do believe she would not want aggressive interventions, however they would like to work-up her current cardiac issues [through less effective means, if possible] to ensure effective management.   Finally, her family feels they are no longer able to care for her at home due to the burden of care, and are desperate for help in transitioning her to a nursing home (ideally with rehab beforehand). Mrs. Kelsay, on the other hand, is strongly against a nursing home or rehab and feels like she can find stable housing and live independently. She is not able to explain her plan for finding housing, or able to clarify her care needs. After a long conversation, she briefly agreed with a short term trial of rehab, but I am not sure if she will stick with this decision.   In my conversation with Mrs. Myszka she was very clear that she was depressed, her life had no value, and she was ready to die. She was clear that she did not plan to kill herself. I brought up the option for medical care, such as anticoagulation, which she refused to discuss with me and did not want to make a decision on. She frequently circled back to her desire to be in a "normal home," and lamented the fact that her children didn't love her enough to take care of her.   Primary Decision Maker I am concerned about Mrs. Mohamad's capacity to make medical decisions. I have placed a psych consult to determine capacity. If she were unable, it would fall to the majority of her adult  children.    SUMMARY OF RECOMMENDATIONS    Psych consulted to determine medical decision making capacity  If she has capacity, Palliative will help with advanced directive paperwork  If she does not have capacity, with need f/u conversation with family re. health management options  SW will need to be involved with pt and  her family in determining d/c plan; consult noted  If she is amenable, I would recommend starting an antidepressant (can follow psych note for recommendations)  Code Status/Advance Care Planning:  DNR  Symptom Management:   Pt endorses high anxiety. She takes Xanax 0.26m BID PRN at home to good effect, will restart that here  Psycho-social/Spiritual:   Desire for further Chaplaincy support:no  Additional Recommendations: TBD  Prognosis:   Unable to determine  Discharge Planning: To Be Determined      Primary Diagnoses: Present on Admission: . TIA (transient ischemic attack)   I have reviewed the medical record, interviewed the patient and family, and examined the patient. The following aspects are pertinent.  Past Medical History:  Diagnosis Date  . Anxiety   . Arthritis   . Asthma   . CAD (coronary artery disease)   . Cardiac disease   . COPD [place] obstructive [place] disease) [place]   . Depression   . Diabetes mellitus without complication [place]   . Hyperlipidemia   . Hypertension   . Hypothyroidism    Social History   Social History  . Marital status: Widowed    Spouse name: N/A  . Number of children: N/A  . Years of education: N/A   Social History Main Topics  . Smoking status: Current Every Day Smoker    Packs/day: 1.00    Years: 63.00  . Smokeless tobacco: Never Used     Comment: has recently cut back, conts to cut back but repors she has a lot of excuses   . Alcohol use 0.0 oz/week     Comment: 3 times per month  . Drug use: No  . Sexual activity: No   Other Topics Concern  . None   Social History Narrative   Diet:      Do you drink/ eat things with caffeine? yes      Marital status:  widowed                             What year were you married ? 1969      Do you live in a house, apartment,assistred living, condo, trailer, etc.)?trailer      Is it one or more stories? no      How many persons live in your home ? none       Do you have any pets in your home ?(please list) 1 dog      Current or past profession: LPN      Do you exercise?  yes                            Type & how often: with PT      Do you have a living will? no      Do you have a DNR form? no                      If not, do you want to discuss one? yes      Do you have signed POA?HPOA forms?  no              If so, please bring to your        appointment         Family History  Problem Relation Age of Onset  . Alcohol abuse Father   . Cancer Mother        lung  . Heart disease Mother   . Diabetes Sister   . Heart disease Sister    Scheduled Meds: . diltiazem  30 mg Oral Q6H  . insulin aspart  0-9 Units Subcutaneous TID WC  . levothyroxine  150 mcg Oral QAC breakfast  . nicotine  7 mg Transdermal Q24H  . sodium chloride flush  3 mL Intravenous Q12H   Continuous Infusions: . ceFAZolin Stopped (11/20/16 0656)  . heparin 900 Units/hr (11/19/16 2335)   PRN Meds:.acetaminophen **OR** acetaminophen Allergies  Allergen Reactions  . Brethine [Terbutaline]     Made patient confused  . Keflex [Cephalexin] Nausea Only    Loss of appetite   Review of Systems  Constitutional: Positive for activity change, appetite change and fatigue.  HENT: Positive for hearing loss. Negative for congestion, ear discharge, sore throat and trouble swallowing.   Eyes: Positive for visual disturbance (legally blind).  Respiratory: Negative for chest tightness and shortness of breath.   Cardiovascular: Positive for leg swelling. Negative for chest pain.  Gastrointestinal: Negative for abdominal distention, abdominal pain, diarrhea, nausea and vomiting.  Genitourinary: Negative for difficulty urinating.  Musculoskeletal: Positive for gait problem. Negative for back pain.  Neurological: Positive for weakness and numbness. Negative for dizziness.  Psychiatric/Behavioral: Negative for confusion, decreased concentration and sleep disturbance. The patient  is nervous/anxious.    Physical Exam  Constitutional: She is oriented to person, place, and time. No distress.  HENT:  Head: Normocephalic and atraumatic.  Mouth/Throat: Oropharynx is clear and moist. No oropharyngeal exudate.  Eyes: EOM are normal.  Legally blind  Neck: Normal range of motion. Neck supple.  Cardiovascular: Normal rate.   Pulmonary/Chest: Effort normal and breath sounds normal.  Abdominal: Soft. Bowel sounds are normal. She exhibits distension.  Musculoskeletal:  Left foot with healing partial amputation. Bandage CDI.  Neurological: She is alert and oriented to person, place, and time.  Skin: Skin is warm and dry.  LLE with erythema and warmth.   Psychiatric: Her affect is labile. Her speech is delayed. She is agitated.   Vital Signs: BP (!) 97/58 (BP Location: Right Arm)   Pulse 74   Temp 97.6 F (36.4 C) (Oral)   Resp 20   Ht _0  (1.626 m)   Wt 62.1 kg (137 lb)   SpO2 99%   BMI 23.52 kg/m  Pain Assessment: No/denies pain     SpO2: SpO2: 99 % O2 Device:SpO2: 99 % O2 Flow Rate: .   IO: Intake/output summary: No intake or output data in the 24 hours ending 11/20/16 0902  LBM:   Baseline Weight: Weight: 62.1 kg (137 lb) Most recent weight: Weight: 62.1 kg (137 lb)     Palliative Assessment/Data: PPS 50-60%     Time In: 1100 Time Out:1230 Time Total: 120 minutes Greater than 50%  of this time was spent counseling and coordinating care related to the above assessment and plan.  Signed by: Charlynn Court, NP Palliative Medicine Team Pager # [phone] (M-F 7a-5p) Team Phone # [phone] (Nights/Weekends)

## 2016-11-21 ENCOUNTER — Inpatient Hospital Stay (HOSPITAL_COMMUNITY): Payer: Medicare Other

## 2016-11-21 DIAGNOSIS — G459 Transient cerebral ischemic attack, unspecified: Secondary | ICD-10-CM | POA: Diagnosis not present

## 2016-11-21 DIAGNOSIS — I351 Nonrheumatic aortic (valve) insufficiency: Secondary | ICD-10-CM

## 2016-11-21 DIAGNOSIS — R4781 Slurred speech: Secondary | ICD-10-CM | POA: Diagnosis not present

## 2016-11-21 DIAGNOSIS — J441 Chronic obstructive pulmonary disease with (acute) exacerbation: Secondary | ICD-10-CM

## 2016-11-21 DIAGNOSIS — I4891 Unspecified atrial fibrillation: Secondary | ICD-10-CM

## 2016-11-21 LAB — BASIC METABOLIC PANEL
ANION GAP: 11 (ref 5–15)
Anion gap: 7 (ref 5–15)
BUN: 22 mg/dL — ABNORMAL HIGH (ref 6–20)
BUN: 19 mg/dL (ref 6–20)
CHLORIDE: 88 mmol/L — AB (ref 101–111)
CHLORIDE: 92 mmol/L — AB (ref 101–111)
CO2: 24 mmol/L (ref 22–32)
CO2: 25 mmol/L (ref 22–32)
Calcium: 8.6 mg/dL — ABNORMAL LOW (ref 8.9–10.3)
Calcium: 8.8 mg/dL — ABNORMAL LOW (ref 8.9–10.3)
Creatinine, Ser: 0.78 mg/dL (ref 0.44–1.00)
Creatinine, Ser: 0.68 mg/dL (ref 0.44–1.00)
GFR calc Af Amer: >60 mL/min (ref >60)
GFR calc non Af Amer: >60 mL/min (ref >60)
GLUCOSE: 322 mg/dL — AB (ref 65–99)
Glucose, Bld: 493 mg/dL — ABNORMAL HIGH (ref 65–99)
POTASSIUM: 5 mmol/L (ref 3.5–5.1)
POTASSIUM: 4.2 mmol/L (ref 3.5–5.1)
SODIUM: 123 mmol/L — AB (ref 135–145)
Sodium: 124 mmol/L — ABNORMAL LOW (ref 135–145)

## 2016-11-21 LAB — GLUCOSE, CAPILLARY
GLUCOSE-CAPILLARY: 456 mg/dL — AB (ref 65–99)
Glucose-Capillary: 379 mg/dL — ABNORMAL HIGH (ref 65–99)
Glucose-Capillary: 252 mg/dL — ABNORMAL HIGH (ref 65–99)
Glucose-Capillary: 102 mg/dL — ABNORMAL HIGH (ref 65–99)

## 2016-11-21 LAB — ECHOCARDIOGRAM COMPLETE
Height: 64 in
Weight: 2222.24 oz

## 2016-11-21 LAB — CBC
HCT: 32 % — ABNORMAL LOW (ref 36.0–46.0)
HEMOGLOBIN: 10.5 g/dL — AB (ref 12.0–15.0)
MCH: 30.3 pg (ref 26.0–34.0)
MCHC: 32.8 g/dL (ref 30.0–36.0)
MCV: 92.2 fL (ref 78.0–100.0)
PLATELETS: 361 10*3/uL (ref 150–400)
RBC: 3.47 MIL/uL — AB (ref 3.87–5.11)
RDW: 14 % (ref 11.5–15.5)
WBC: 8.1 10*3/uL (ref 4.0–10.5)

## 2016-11-21 LAB — OSMOLALITY: Osmolality: 288 mOsm/kg (ref 275–295)

## 2016-11-21 LAB — TROPONIN I: TROPONIN I: 0.37 ng/mL — AB (ref <0.03)

## 2016-11-21 MED ORDER — DILTIAZEM HCL ER COATED BEADS 120 MG PO CP24
120.0000 mg | ORAL_CAPSULE | Freq: Every day | ORAL | Status: DC
  Administered 2016-11-21 – 2016-11-22 (×2): 120 mg via ORAL
  Filled 2016-11-21 (×3): qty 1

## 2016-11-21 MED ORDER — INSULIN GLARGINE 100 UNIT/ML ~~LOC~~ SOLN
10.0000 [IU] | Freq: Once | SUBCUTANEOUS | Status: AC
  Administered 2016-11-21: 10 [IU] via SUBCUTANEOUS
  Filled 2016-11-21: qty 0.1

## 2016-11-21 MED ORDER — INSULIN ASPART 100 UNIT/ML ~~LOC~~ SOLN
0.0000 [IU] | Freq: Three times a day (TID) | SUBCUTANEOUS | Status: DC
  Administered 2016-11-21: 18 [IU] via SUBCUTANEOUS
  Administered 2016-11-21: 8 [IU] via SUBCUTANEOUS
  Administered 2016-11-21: 15 [IU] via SUBCUTANEOUS
  Administered 2016-11-22 (×2): 5 [IU] via SUBCUTANEOUS
  Administered 2016-11-22: 11 [IU] via SUBCUTANEOUS
  Administered 2016-11-23: 3 [IU] via SUBCUTANEOUS
  Administered 2016-11-23 (×2): 8 [IU] via SUBCUTANEOUS

## 2016-11-21 NOTE — Progress Notes (Signed)
  Echocardiogram 2D Echocardiogram has been performed.  Tara Flores, Tara Flores 11/21/2016, 10:40 AM

## 2016-11-21 NOTE — Progress Notes (Signed)
Progress Note  Patient Name: Tara Flores Date of Encounter: 11/21/2016  Primary Cardiologist: Jens Som, new  Subjective   No CP, no SOB  Inpatient Medications    Scheduled Meds: . apixaban  5 mg Oral BID  . diltiazem  30 mg Oral Q6H  . insulin aspart  0-15 Units Subcutaneous TID WC  . insulin glargine  15 Units Subcutaneous QHS  . levothyroxine  150 mcg Oral QAC breakfast  . nicotine  7 mg Transdermal Q24H  . sodium chloride flush  3 mL Intravenous Q12H  . sulfamethoxazole-trimethoprim  1 tablet Oral Q12H   Continuous Infusions:  PRN Meds: acetaminophen **OR** acetaminophen, albuterol, ALPRAZolam   Vital Signs    Vitals:   11/21/16 0105 11/21/16 0543 11/21/16 0948 11/21/16 1150  BP: (!) 106/58 99/63 (!) 97/55 (!) 107/53  Pulse: 90 69 86 81  Resp: 20 20  20   Temp: 98 F (36.7 C) 97.9 F (36.6 C) 98.4 F (36.9 C) 97.6 F (36.4 C)  TempSrc: Oral Oral Oral Oral  SpO2: 96% 97% 97% 100%  Weight:  138 lb 14.2 oz (63 kg)    Height:        Intake/Output Summary (Last 24 hours) at 11/21/16 1319 Last data filed at 11/21/16 0900  Gross per 24 hour  Intake              720 ml  Output                0 ml  Net              720 ml   Filed Weights   11/19/16 1334 11/21/16 0543  Weight: 137 lb (62.1 kg) 138 lb 14.2 oz (63 kg)    Telemetry    AFIB - rate controlled- Personally Reviewed   Physical Exam  GEN: Frail in no acute distress  HEENT: normal  Neck: no JVD, carotid bruits, or masses Cardiac: irreg irreg; 2/6 SM LLSB, no rubs, or gallops,no edema  Respiratory:  clear to auscultation bilaterally, normal work of breathing GI: soft, nontender, nondistended, + BS MS: left foot amputation  Skin: warm and dry, no rash Neuro:  Alert, Strength and sensation are intact Psych: euthymic mood, full affect   Labs    Chemistry  Recent Labs Lab 11/19/16 1435 11/20/16 0936 11/21/16 0235  NA 129* 125* 123*  K 4.5 4.2 5.0  CL 97* 92* 88*  CO2 22 25 24     GLUCOSE 89 362* 493*  BUN 21* 20 22*  CREATININE 0.71 0.71 0.78  CALCIUM 8.6* 8.5* 8.6*  PROT 7.3  --   --   ALBUMIN 2.8*  --   --   AST 17  --   --   ALT 10*  --   --   ALKPHOS 120  --   --   BILITOT 0.8  --   --   GFRNONAA >60 >60 >60  GFRAA >60 >60 >60  ANIONGAP 10 8 11      Hematology  Recent Labs Lab 11/19/16 1435 11/19/16 1939 11/20/16 0936 11/21/16 0235  WBC 8.1  --  6.6 8.1  RBC 3.72*  --  3.38* 3.47*  HGB 11.3*  --  10.3* 10.5*  HCT 34.4* 32.2* 31.5* 32.0*  MCV 92.5  --  93.2 92.2  MCH 30.4  --  30.5 30.3  MCHC 32.8  --  32.7 32.8  RDW 14.1  --  14.4 14.0  PLT 351  --  362 361  Cardiac Enzymes  Recent Labs Lab 11/20/16 0024 11/20/16 1234 11/20/16 1949 11/21/16 0235  TROPONINI 0.51* 0.29* 0.40* 0.37*     Recent Labs Lab 11/19/16 1443  TROPIPOC 0.14*     BNP  Recent Labs Lab 11/19/16 1939  BNP 464.5*     DDimer No results for input(s): DDIMER in the last 168 hours.   Radiology    Dg Chest 2 View  Result Date: 11/19/2016 CLINICAL DATA:  81 year old female with chest pain slurred speech, altered mental status, confusion. EXAM: CHEST  2 VIEW COMPARISON:  10/11/2016 and earlier. Thoracic spine radiographs 12/24/2015. FINDINGS: Upright AP and lateral views of the chest. Stable cardiomegaly and mediastinal contours. Tortuous thoracic aorta with calcified atherosclerosis. Chronic hyperinflation. Chronic but increased streaky opacity at the left lung base. No pneumothorax. Acute on chronic pulmonary interstitial opacity. No definite pleural effusion. Osteopenia. New or increased lower thoracic versus L1 compression fracture since July 2017. There was mild superior endplate deformity at the same level on the prior study. Negative visible bowel gas pattern. IMPRESSION: 1. Acute on chronic increased pulmonary interstitial opacity and streaky left lower lobe opacity. Differential considerations include interstitial edema with atelectasis and  viral/atypical respiratory infection. No definite pleural effusion. 2. Chronic compression fracture suspected at the thoracolumbar junction, but increased since July 2017. Osteopenia. 3. Chronic cardiomegaly and calcified aortic atherosclerosis. Electronically Signed   By: Odessa Fleming M.D.   On: 11/19/2016 15:10   Ct Head Wo Contrast  Result Date: 11/19/2016 CLINICAL DATA:  INITIAL EVALUATION FOR ACUTE ALTERED MENTAL STATUS, SLURRED SPEECH, NOW RESOLVED. EXAM: CT HEAD WITHOUT CONTRAST CT ANGIOGRAPHY NECK TECHNIQUE: Multidetector CT imaging of the neck was performed using the standard protocol during bolus administration of intravenous contrast. Multiplanar CT image reconstructions and MIPs were obtained to evaluate the vascular anatomy. Carotid stenosis measurements (when applicable) are obtained utilizing NASCET criteria, using the distal internal carotid diameter as the denominator. CONTRAST:  50 CC OF ISOVUE 370. COMPARISON:  Prior head CT from 02/05/2015. FINDINGS: CT HEAD FINDINGS: Brain: Moderate cerebral atrophy with chronic microvascular ischemic disease. Small remote lacunar infarct present within the left lentiform nucleus. No acute intracranial hemorrhage. No evidence for acute large vessel territory infarct. No mass lesion, midline shift or mass effect. Mild ventricular prominence related to global parenchymal volume loss without hydrocephalus. No extra-axial fluid collection. Vascular: No hyperdense vessel. Intracranial atherosclerosis noted within the carotid siphons and distal vertebral arteries. Skull: Scalp soft tissues within normal limits.  Calvarium intact. Sinuses/Orbits: Globes and orbital soft tissues within normal limits. Patient status post lens extraction bilaterally. Mild mucosal thickening within the ethmoidal air cells and left maxillary sinus. Paranasal sinuses are otherwise clear. No mastoid effusion. CTA NECK FINDINGS: Aortic arch: Visualized aortic arch of normal caliber with normal  branch pattern. Calcified atheromatous plaque about the origin of the great vessels without flow-limiting stenosis. Atheromatous plaque also noted within the proximal descending intrathoracic aorta. Visualized subclavian artery is patent without flow-limiting stenosis. Right carotid system: Scattered atheromatous plaque within the right common carotid artery without significant stenosis. Calcified plaque about the right carotid bifurcation/proximal right ICA with moderate narrowing of approximately 50% by NASCET criteria. Distally, right ICA patent to the circle of Willis without flow-limiting stenosis. Right ICA medialized into the retropharyngeal space. Atheromatous plaque noted within the carotid siphons without high-grade stenosis. Left carotid system: Mild scattered plaque present within left common carotid artery without flow-limiting stenosis. Eccentric calcified plaque about the left carotid bifurcation/proximal left ICA with associated narrowing of up  to 40-50% by NASCET criteria. Distally, left ICA patent to the circle of Willis without flow-limiting stenosis. Left ICA is medialized into the retropharyngeal space. Moderate atheromatous plaque within the carotid siphons without high-grade stenosis Vertebral arteries: Both of the vertebral arteries arise from the subclavian arteries vertebral arteries tortuous bilaterally. Left vertebral artery dominant. Vertebral arteries widely patent within the neck without stenosis or occlusion. Skeleton: No acute osseous abnormality. No worrisome lytic or blastic osseous lesions. Reversal the normal cervical lordosis with grade 1 anterolisthesis of C3 on C4 and C4 on C5. Advanced degenerate spondylolysis at C5-6 and C6-7. Prominent facet arthrosis present within the upper cervical spine. Other neck: No acute soft tissue abnormality identified within the neck. Thyroid appears to be absent. Salivary glands within normal limits. Upper chest: Scattered pretracheal and  precarinal lymph nodes measure up to 15 mm. subcarinal node measures 17 mm. Findings are indeterminate, but may be reactive. Small amount of layering secretions noted within the subglottic trachea. Mild centrilobular emphysema. Subtle interlobular septal thickening suggests mild pulmonary interstitial edema. 4 mm subpleural nodule noted within the peripheral left upper lobe (series 3, image 93). Visualized lungs are otherwise clear. IMPRESSION: CT HEAD IMPRESSION: 1. No acute intracranial process identified. 2. Moderate cerebral atrophy with chronic small vessel ischemic disease. CTA NECK IMPRESSION: 1. Atheromatous stenosis about the carotid bifurcations bilaterally with associated stenosis of approximately 40-50% by NASCET criteria, right slightly worse than left. 2. Widely patent vertebral arteries within the neck. 3. Diffuse vessel tortuosity, suggesting chronic underlying hypertension. 4. Enlarged mediastinal adenopathy as above, indeterminate, but may be reactive in nature. 5. Mild diffuse interlobular septal thickening within the visualized lungs, suggesting mild pulmonary interstitial edema. 6. Mild upper lobe predominant centrilobular emphysema. 7. 4 mm left upper lobe pulmonary nodule, indeterminate. No follow-up needed if patient is low-risk. Non-contrast chest CT can be considered in 12 months if patient is high-risk. This recommendation follows the consensus statement: Guidelines for Management of Incidental Pulmonary Nodules Detected on CT Images: From the Fleischner Society 2017; Radiology 2017; 284:228-243. Electronically Signed   By: Rise Mu M.D.   On: 11/19/2016 22:56   Ct Angio Neck W Or Wo Contrast  Result Date: 11/19/2016 CLINICAL DATA:  INITIAL EVALUATION FOR ACUTE ALTERED MENTAL STATUS, SLURRED SPEECH, NOW RESOLVED. EXAM: CT HEAD WITHOUT CONTRAST CT ANGIOGRAPHY NECK TECHNIQUE: Multidetector CT imaging of the neck was performed using the standard protocol during bolus  administration of intravenous contrast. Multiplanar CT image reconstructions and MIPs were obtained to evaluate the vascular anatomy. Carotid stenosis measurements (when applicable) are obtained utilizing NASCET criteria, using the distal internal carotid diameter as the denominator. CONTRAST:  50 CC OF ISOVUE 370. COMPARISON:  Prior head CT from 02/05/2015. FINDINGS: CT HEAD FINDINGS: Brain: Moderate cerebral atrophy with chronic microvascular ischemic disease. Small remote lacunar infarct present within the left lentiform nucleus. No acute intracranial hemorrhage. No evidence for acute large vessel territory infarct. No mass lesion, midline shift or mass effect. Mild ventricular prominence related to global parenchymal volume loss without hydrocephalus. No extra-axial fluid collection. Vascular: No hyperdense vessel. Intracranial atherosclerosis noted within the carotid siphons and distal vertebral arteries. Skull: Scalp soft tissues within normal limits.  Calvarium intact. Sinuses/Orbits: Globes and orbital soft tissues within normal limits. Patient status post lens extraction bilaterally. Mild mucosal thickening within the ethmoidal air cells and left maxillary sinus. Paranasal sinuses are otherwise clear. No mastoid effusion. CTA NECK FINDINGS: Aortic arch: Visualized aortic arch of normal caliber with normal branch pattern. Calcified atheromatous  plaque about the origin of the great vessels without flow-limiting stenosis. Atheromatous plaque also noted within the proximal descending intrathoracic aorta. Visualized subclavian artery is patent without flow-limiting stenosis. Right carotid system: Scattered atheromatous plaque within the right common carotid artery without significant stenosis. Calcified plaque about the right carotid bifurcation/proximal right ICA with moderate narrowing of approximately 50% by NASCET criteria. Distally, right ICA patent to the circle of Willis without flow-limiting stenosis.  Right ICA medialized into the retropharyngeal space. Atheromatous plaque noted within the carotid siphons without high-grade stenosis. Left carotid system: Mild scattered plaque present within left common carotid artery without flow-limiting stenosis. Eccentric calcified plaque about the left carotid bifurcation/proximal left ICA with associated narrowing of up to 40-50% by NASCET criteria. Distally, left ICA patent to the circle of Willis without flow-limiting stenosis. Left ICA is medialized into the retropharyngeal space. Moderate atheromatous plaque within the carotid siphons without high-grade stenosis Vertebral arteries: Both of the vertebral arteries arise from the subclavian arteries vertebral arteries tortuous bilaterally. Left vertebral artery dominant. Vertebral arteries widely patent within the neck without stenosis or occlusion. Skeleton: No acute osseous abnormality. No worrisome lytic or blastic osseous lesions. Reversal the normal cervical lordosis with grade 1 anterolisthesis of C3 on C4 and C4 on C5. Advanced degenerate spondylolysis at C5-6 and C6-7. Prominent facet arthrosis present within the upper cervical spine. Other neck: No acute soft tissue abnormality identified within the neck. Thyroid appears to be absent. Salivary glands within normal limits. Upper chest: Scattered pretracheal and precarinal lymph nodes measure up to 15 mm. subcarinal node measures 17 mm. Findings are indeterminate, but may be reactive. Small amount of layering secretions noted within the subglottic trachea. Mild centrilobular emphysema. Subtle interlobular septal thickening suggests mild pulmonary interstitial edema. 4 mm subpleural nodule noted within the peripheral left upper lobe (series 3, image 93). Visualized lungs are otherwise clear. IMPRESSION: CT HEAD IMPRESSION: 1. No acute intracranial process identified. 2. Moderate cerebral atrophy with chronic small vessel ischemic disease. CTA NECK IMPRESSION: 1.  Atheromatous stenosis about the carotid bifurcations bilaterally with associated stenosis of approximately 40-50% by NASCET criteria, right slightly worse than left. 2. Widely patent vertebral arteries within the neck. 3. Diffuse vessel tortuosity, suggesting chronic underlying hypertension. 4. Enlarged mediastinal adenopathy as above, indeterminate, but may be reactive in nature. 5. Mild diffuse interlobular septal thickening within the visualized lungs, suggesting mild pulmonary interstitial edema. 6. Mild upper lobe predominant centrilobular emphysema. 7. 4 mm left upper lobe pulmonary nodule, indeterminate. No follow-up needed if patient is low-risk. Non-contrast chest CT can be considered in 12 months if patient is high-risk. This recommendation follows the consensus statement: Guidelines for Management of Incidental Pulmonary Nodules Detected on CT Images: From the Fleischner Society 2017; Radiology 2017; 284:228-243. Electronically Signed   By: Rise MuBenjamin  McClintock M.D.   On: 11/19/2016 22:56   ECHO 11/21/16 - Left ventricle: The cavity size was normal. Wall thickness was   increased in a pattern of mild LVH. Systolic function was normal.   The estimated ejection fraction was in the range of 60% to 65%.   Wall motion was normal; there were no regional wall motion   abnormalities. Doppler parameters are consistent with a   reversible restrictive pattern, indicative of decreased left   ventricular diastolic compliance and/or increased left atrial   pressure (grade 3 diastolic dysfunction). - Aortic valve: Trileaflet; mildly thickened, moderately calcified   leaflets. There was mild regurgitation. - Mitral valve: Moderately calcified annulus. There was mild to  moderate regurgitation. - Left atrium: The atrium was mildly dilated. - Right ventricle: The cavity size was mildly dilated. Wall   thickness was normal. - Right atrium: The atrium was moderately dilated. - Pulmonary arteries: Systolic  pressure was moderately increased.   PA peak pressure: 47 mm Hg (S).  Impressions:  - No cardiac source of emboli was indentified.   Patient Profile     81 y.o. female admitted with new onset atrial fibrillation and probable TIA. Also with past medical history of moderate coronary artery disease, diabetes mellitus, hypertension, hyperlipidemia, COPD and recent partial left foot amputation for osteomyelitis.  Assessment & Plan    1 Persistent AFIB, newly discovered  -Will consolidate Cardizem for rate control to CD 120 QD  -ECHO with normal EF  -CHADSvasc 6.   -apixaban 5 mg BID. She is at elevated risk for anticoagulation as outlined in history of present illness but is also at high risk for embolic event particularly with recent TIA.  Best option is likely rate control and anticoagulation as she does not want procedures.  2 hypertension-blood pressure is soft.  - Stopped carvedilol and lisinopril previously - Cardizem for atrial fibrillation.  3 elevated troponin-likely secondary to atrial fibrillation, demand ischemia. She has not had chest pain. She does not want invasive cardiac procedures. No plans for further ischemia evaluation. Elevated troponin does worsen overall prognosis however.   4Lung nodule-  follow-up for primary care.   5 Secondary pulmonary hypertension - noted on ECHO. Likely from COPD with combination of diastolic dysfunction  Will sign off. Please call if questions.   Signed, Donato Schultz, MD  11/21/2016, 1:19 PM

## 2016-11-21 NOTE — Progress Notes (Signed)
*  PRELIMINARY RESULTS* Vascular Ultrasound Carotid Duplex (Doppler) has been completed.  Preliminary findings: Bilateral: No significant (1-39%) ICA stenosis. Antegrade vertebral flow.     Farrel DemarkJill Eunice, RDMS, RVT  11/21/2016, 4:26 PM

## 2016-11-21 NOTE — Progress Notes (Signed)
Clinical Social Worker was consulted for SNF placement for patient. CSW met patient at bedside to discuss patients needs at discharge. Patient stated her family is looking into SNF for her and if they find a place then patient will go to facility. Patient seemed confused about the conversation because patient started talking about SNF long term. CSW asked patient if she wanted to go to SNF for rehab but patient stated "i do not want to talk to social work because every time I talk to a Education officer, museum they just get me in trouble". CSW asked if she could contact family and patient stated she does not want CSW calling daughter. CSW is unable to do assessment as patient is unwilling to communicate, at this time CSW is signing off.  [name], MSW,  [place]

## 2016-11-21 NOTE — Progress Notes (Signed)
Family Medicine Teaching Service Daily Progress Note Intern Pager: [phone]  Patient name: Tara Flores Medical record number: [id number] Date of birth: June 30, 1935 Age: 81 y.o. Gender: female  Primary Care Provider: Annita Brod, MD Consultants: Cardiology, Neurology (signed off), palliative, psychiatry Code Status: DNR  Pt Overview and Major Events to Date:  5/31 admitted for transient speech difficult in the setting of new onset Afib  Assessment and Plan: Tara Flores is a 81 y.o. female with a past medical history significant for T2DM, HTN,CAD, COPD, hypothyroidism, tobacco abuse presenting with slurred speech and altered mental status.  Atrial fibrillation with RVR, new onset. Now rate controlled, still in Afib on AM EKG. CHA2DS2-Vasc 6. Troponin 0.14>0.35>0.51 likely 2/2 demand. Patient does not want procedures. - Per Cardiology, continue cardiazem for rate control and increase as needed. Transition from heparin to Eliquis 5mg  BID - Echo pending - Discontinued coreg and lisinopril  Hyponatremia likely 2/2 fluid overload:  Most likely hypervolemic hyponatremia given significant edema noted in her lower extremities. Some concern for CHF, CXR showed cardiomegaly and interstitial edema. Upper lung fields seen on CTA neck also showing mild pulmonary interstitial edema. Last ECHO in 2016 with normal EF 60-65%, G1DD. S/p IV Lasix x2. Na continues to trend down at 123 today compared to 125 yesterday.  - Echo pending - Strict I/Os, daily weights - Cardiology following - Monitor BMP - Consider another dose IV Lasix   CAD: 01/2015 cardiac cath noting triple vessel CAD with mild to moderate diffuse disease in the heavily calcified RCA and Circumflex system. - Cards following, see above for recommendations  Slurred speech/altered mental status, resolved. Initially concerning for TIA, returned to mental baseline. CT head no acute intracranial process and CTA neck with atheromatous stenosis  40-50%.  - Carotid doppler, Echo pending - Per neurology, needs anticoagulation. Signed off. - Tylenol for pain   Pulmonary nodule 4 mm left upper lobe pulmonary nodule seen on upper lung field on CTA neck. Patient is active smoker and does not want further work up. - Palliative consult - Consider CT chest pending GOC discussion  Left foot amputation and venous stasis, chronic Recent partial amputation of L foot 2/2 osteomyelitis. Was on cefazolin then transitioned to bactrim as outpatient to complete 4 week course on 11/24/16 - Bactrim q12h - Monitor - PT/OT consult  T2DM, uncontrolled. A1c 9.0. At home on Patient is on Lantus 15 U bid and SSI Novolog before each meals. Am CBG 372 - Moderate Sliding Scale Insulin.  - Lantus 15U qhs - Monitor CBGs  Hypertension, chronic BP borderline low at 97/58 this am - Discontinued coreg and lisinopril. On cardizem for afib - Continue to monitor vitals   COPD/Asthma   - Home albuterol q6prn - Monitor respiratory status  Tobacco abuse, chronic.  - 7 mg nicotine patch - Counseling on smoking cessation  Hypothyroidism TSH 0.926 - continue home levothyroxine  Concern for depression Patient endorsing depressive thoughts and readiness to die to both FM team and palliative care. Palliative with concerns about capacity to make decisions, so consulted psych.  - Psych consulted. Awaiting recommendations.   FEN/GI: heart healthy/carb modified diet PPx: eliquis  Disposition: pending mental capacity evaluation and medical stabilization  Subjective:  No acute events overnight. No complaints this AM. Says she is leaving Monday regardless of whether echo has been completed or not.   Objective: Temp:  [97.9 F (36.6 C)-98.4 F (36.9 C)] 98.4 F (36.9 C) (06/02 0948) Pulse Rate:  [69-95] 86 (06/02  [id number]) Resp:  [20] 20 (06/02 0543) BP: (97-119)/(55-63) 97/55 (06/02 0948) SpO2:  [96 %-98 %] 97 % (06/02 0948) Weight:  [138 lb 14.2 oz (63 kg)]  138 lb 14.2 oz (63 kg) (06/02 0543) Physical Exam: General: NAD, sitting up in chair eating breakfast Cardiovascular: irregularly irregular, no murmurs appreciated Respiratory: CTAB, normal effort, No wheezes, rales or rhonchi Abdomen: soft, nontender, nondistended, +BS Extremities: Left foot s/p partial amputation. Bilateral lower extremities erythema and 1+ edema from mid shin to ankle, warm, no lesions or weeping noted.  Laboratory:  Recent Labs Lab 11/19/16 1435 11/19/16 1939 11/20/16 0936 11/21/16 0235  WBC 8.1  --  6.6 8.1  HGB 11.3*  --  10.3* 10.5*  HCT 34.4* 32.2* 31.5* 32.0*  PLT 351  --  362 361    Recent Labs Lab 11/19/16 1435 11/20/16 0936 11/21/16 0235  NA 129* 125* 123*  K 4.5 4.2 5.0  CL 97* 92* 88*  CO2 22 25 24   BUN 21* 20 22*  CREATININE 0.71 0.71 0.78  CALCIUM 8.6* 8.5* 8.6*  PROT 7.3  --   --   BILITOT 0.8  --   --   ALKPHOS 120  --   --   ALT 10*  --   --   AST 17  --   --   GLUCOSE 89 362* 493*   BNP 464.5  Imaging/Diagnostic Tests: No results found.  [place] SaaLanc[place], Melitza Metheny Joseph, MD 11/21/2016, 10:40 AM PGY-2, [facility] FPTS Intern pager: [phone], text pages welcome

## 2016-11-22 ENCOUNTER — Encounter (HOSPITAL_COMMUNITY): Payer: Self-pay | Admitting: *Deleted

## 2016-11-22 DIAGNOSIS — J441 Chronic obstructive pulmonary disease with (acute) exacerbation: Secondary | ICD-10-CM | POA: Diagnosis not present

## 2016-11-22 DIAGNOSIS — G459 Transient cerebral ischemic attack, unspecified: Secondary | ICD-10-CM | POA: Diagnosis not present

## 2016-11-22 DIAGNOSIS — I4891 Unspecified atrial fibrillation: Secondary | ICD-10-CM | POA: Diagnosis not present

## 2016-11-22 DIAGNOSIS — R4781 Slurred speech: Secondary | ICD-10-CM | POA: Diagnosis not present

## 2016-11-22 LAB — VAS US CAROTID
LEFT ECA DIAS: 14 cm/s
LEFT VERTEBRAL DIAS: 18 cm/s
LICAPDIAS: -16 cm/s
Left CCA dist dias: 14 cm/s
Left CCA dist sys: 55 cm/s
Left CCA prox dias: 11 cm/s
Left CCA prox sys: 67 cm/s
Left ICA dist dias: -21 cm/s
Left ICA dist sys: -105 cm/s
Left ICA prox sys: -55 cm/s
RCCADSYS: -53 cm/s
RCCAPDIAS: 14 cm/s
RIGHT ECA DIAS: 25 cm/s
RIGHT VERTEBRAL DIAS: 10 cm/s
Right CCA prox sys: 54 cm/s

## 2016-11-22 LAB — CBC
HEMATOCRIT: 30.9 % — AB (ref 36.0–46.0)
Hemoglobin: 10.2 g/dL — ABNORMAL LOW (ref 12.0–15.0)
MCH: 30.4 pg (ref 26.0–34.0)
MCHC: 33 g/dL (ref 30.0–36.0)
MCV: 92 fL (ref 78.0–100.0)
PLATELETS: 389 10*3/uL (ref 150–400)
RBC: 3.36 MIL/uL — AB (ref 3.87–5.11)
RDW: 13.9 % (ref 11.5–15.5)
WBC: 6.9 10*3/uL (ref 4.0–10.5)

## 2016-11-22 LAB — BASIC METABOLIC PANEL
ANION GAP: 8 (ref 5–15)
BUN: 17 mg/dL (ref 6–20)
CALCIUM: 8.6 mg/dL — AB (ref 8.9–10.3)
CO2: 27 mmol/L (ref 22–32)
Chloride: 93 mmol/L — ABNORMAL LOW (ref 101–111)
Creatinine, Ser: 0.62 mg/dL (ref 0.44–1.00)
GLUCOSE: 246 mg/dL — AB (ref 65–99)
POTASSIUM: 4.1 mmol/L (ref 3.5–5.1)
Sodium: 128 mmol/L — ABNORMAL LOW (ref 135–145)

## 2016-11-22 LAB — GLUCOSE, CAPILLARY
GLUCOSE-CAPILLARY: 239 mg/dL — AB (ref 65–99)
Glucose-Capillary: 214 mg/dL — ABNORMAL HIGH (ref 65–99)
Glucose-Capillary: 347 mg/dL — ABNORMAL HIGH (ref 65–99)
Glucose-Capillary: 363 mg/dL — ABNORMAL HIGH (ref 65–99)

## 2016-11-22 LAB — SODIUM, URINE, RANDOM: SODIUM UR: 49 mmol/L

## 2016-11-22 NOTE — [place] FL2 (Signed)
[facility] [place] MEDICAID FL2 LEVEL OF CARE SCREENING TOOL     IDENTIFICATION  Patient Name: Tara Flores Birthdate: 07/28/1935 Sex: female Admission Date (Current Location): 11/19/2016  [facility] [place]   Facility and Address:  [facility], [address], [place], [place] [address]      Provider Number: [id number]  Attending Physician Name and Address:  Tobey GrimWalden, Jeffrey H, MD  Relative Name and Phone Number:       Current Level of Care: Hospital Recommended Level of Care: Skilled Nursing Facility Prior Approval Number:    Date Approved/Denied:   PASRR Number: [id number]  Discharge Plan: SNF    Current Diagnoses: Patient Active Problem List   Diagnosis Date Noted  . New onset a-fib (HCC) 11/20/2016  . Atrial fibrillation with RVR (HCC)   . COPD exacerbation (HCC)   . Goals of care, counseling/discussion   . Palliative care by specialist   . TIA (transient ischemic attack) 11/19/2016  . Diabetic foot ulcer (HCC) 10/10/2016  . Hypokalemia 10/10/2016  . Anemia due to chronic kidney disease 10/10/2016  . Cellulitis in diabetic foot (HCC) 10/09/2016  . Venous ulcer of left leg (HCC) 07/28/2016  . Dry skin dermatitis 07/28/2016  . Type 2 diabetes mellitus with hyperglycemia (HCC) 02/06/2015  . Hip fracture (HCC) 02/05/2015  . Closed right hip fracture (HCC) 02/05/2015  . Abnormal EKG 02/05/2015  . Tobacco abuse 02/05/2015  . DM2 (diabetes mellitus, type 2) (HCC) 02/05/2015  . Essential hypertension 02/05/2015  . Hypothyroidism 02/05/2015  . Coronary artery disease involving native coronary artery of native heart without angina pectoris     Orientation RESPIRATION BLADDER Height & Weight     Self, Time, Situation, Place  Normal Continent Weight: 139 lb 1.8 oz (63.1 kg) Height:  5\' 4"  (162.6 cm)  BEHAVIORAL SYMPTOMS/MOOD NEUROLOGICAL BOWEL NUTRITION STATUS      Continent Diet (heart healthy/carb modified)  AMBULATORY  STATUS COMMUNICATION OF NEEDS Skin   Limited Assist Verbally Normal                       Personal Care Assistance Level of Assistance  Bathing, Feeding, Dressing Bathing Assistance: Limited assistance Feeding assistance: Independent Dressing Assistance: Limited assistance     Functional Limitations Info  Sight, Hearing, Speech Sight Info: Adequate Hearing Info: Impaired Speech Info: Adequate    SPECIAL CARE FACTORS FREQUENCY  PT (By licensed PT), OT (By licensed OT)     PT Frequency: 5x wk OT Frequency: 5x wk            Contractures Contractures Info: Not present    Additional Factors Info  Code Status, Allergies Code Status Info: DNR Allergies Info: BRETHINE TERBUTALINE, KEFLEX CEPHALEXIN           Current Medications (11/22/2016):  This is the current hospital active medication list Current Facility-Administered Medications  Medication Dose Route Frequency Provider Last Rate Last Dose  . acetaminophen (TYLENOL) tablet 650 mg  650 mg Oral Q6H PRN Diallo, Abdoulaye, MD   650 mg at 11/21/16 2113   Or  . acetaminophen (TYLENOL) suppository 650 mg  650 mg Rectal Q6H PRN Diallo, Abdoulaye, MD      . albuterol (PROVENTIL) (2.5 MG/3ML) 0.083% nebulizer solution 3 mL  3 mL Inhalation Q6H PRN Casey BurkittFitzgerald, Hillary Moen, MD      . ALPRAZolam Prudy Feeler(XANAX) tablet 0.25 mg  0.25 mg Oral BID PRN Ranae Palmsunn, Sarah Elizabeth, NP   0.25 mg  at 11/21/16 2114  . apixaban (ELIQUIS) tablet 5 mg  5 mg Oral BID Jeneen Rinks J, DO   5 mg at 11/22/16 1610  . diltiazem (CARDIZEM CD) 24 hr capsule 120 mg  120 mg Oral q1800 Jake Bathe, MD   120 mg at 11/21/16 1747  . insulin aspart (novoLOG) injection 0-15 Units  0-15 Units Subcutaneous TID WC Casey Burkitt, MD   5 Units at 11/22/16 [id number]  . insulin glargine (LANTUS) injection 15 Units  15 Units Subcutaneous QHS Leland Her, DO   15 Units at 11/21/16 2114  . levothyroxine (SYNTHROID, LEVOTHROID) tablet 150 mcg  150 mcg Oral QAC breakfast  Diallo, Abdoulaye, MD   150 mcg at 11/22/16 0913  . nicotine (NICODERM CQ - dosed in mg/24 hr) patch 7 mg  7 mg Transdermal Q24H Berton Bon, MD   7 mg at 11/21/16 2100  . sodium chloride flush (NS) 0.9 % injection 3 mL  3 mL Intravenous Q12H Diallo, Abdoulaye, MD   3 mL at 11/22/16 1000  . sulfamethoxazole-trimethoprim (BACTRIM DS,SEPTRA DS) 800-160 MG per tablet 1 tablet  1 tablet Oral Q12H Diallo, Abdoulaye, MD   1 tablet at 11/22/16 5409     Discharge Medications: Please see discharge summary for a list of discharge medications.  Relevant Imaging Results:  Relevant Lab Results:   Additional Information SS#[id number]  Althea Charon, LCSW

## 2016-11-22 NOTE — Clinical Social Work Note (Signed)
Clinical Social Work Assessment  Patient Details  Name: Tara PoeBetty L Fong MRN: [id number] Date of Birth: 09/15/1935  Date of referral:  11/22/16               Reason for consult:  Discharge Planning                Permission sought to share information with:  Family Supports Permission granted to share information::  Yes, Verbal Permission Granted  Name::     Beryle BeamsBetty Senor  Agency::     Relationship::  daughter  Contact Information:  [phone]  Housing/Transportation Living arrangements for the past 2 months:  Single Family Home Source of Information:  Patient Patient Interpreter Needed:  None Criminal Activity/Legal Involvement Pertinent to Current Situation/Hospitalization:  No - Comment as needed Significant Relationships:  Adult Children, Other Family Members Lives with:  Self Do you feel safe going back to the place where you live?  Yes Need for family participation in patient care:  Yes (Comment)  Care giving concerns:  Patient stated she has support from her family   Social Worker assessment / plan:  Clinical Social Worker went back to patients room at the request of the patient. Patient apologize for being rude to CSW during first assessment. Patient stated she is agreeable to go to a SNF for rehab and [facility] CSW to complete necessary paperwork  And initiate SNF search on patients behalf.   Employment status:  Retired Database administratornsurance information:  Managed Medicare PT Recommendations:  Skilled Nursing Facility Information / Referral to community resources:  Skilled Nursing Facility  Patient/Family's Response to care:  Family is very supportive. Patient stated while she is in rehab her family will find her an ALF that she can move into  Patient/Family's Understanding of and Emotional Response to Diagnosis, Current Treatment, and Prognosis:  Patient with good understandings of current medical state and limitations of recent hospitalization  Emotional  Assessment Appearance:  Appears stated age Attitude/Demeanor/Rapport:  Other Affect (typically observed):  Calm Orientation:  Oriented to Situation, Oriented to Place, Oriented to  Time, Oriented to Self Alcohol / Substance use:  Not Applicable Psych involvement (Current and /or in the community):  No (Comment)  Discharge Needs  Concerns to be addressed:  No discharge needs identified Readmission within the last 30 days:  No Current discharge risk:  None Barriers to Discharge:  No Barriers Identified   Althea Charonshley C Kadeja Granada, LCSW 11/22/2016, 11:02 AM

## 2016-11-22 NOTE — Discharge Instructions (Addendum)
We have added two new medications during your hospitalization. Please continue taking: - Eliquis one tablet twice a day - Diltiazem one tablet once a day  Please STOP taking: - Coreg (carvedilol)  - Lisinopril.   Please continue taking your other medications as you were before your hospitalization.   Please schedule a hospital follow-up appointment with your regular doctor within the next week.     Information on my medicine - ELIQUIS (apixaban)  Why was Eliquis prescribed for you? Eliquis was prescribed for you to reduce the risk of a blood clot forming that can cause a stroke if you have a medical condition called atrial fibrillation (a type of irregular heartbeat).  What do You need to know about Eliquis ? Take your Eliquis TWICE DAILY - one tablet in the morning and one tablet in the evening with or without food. If you have difficulty swallowing the tablet whole please discuss with your pharmacist how to take the medication safely.  Take Eliquis exactly as prescribed by your doctor and DO NOT stop taking Eliquis without talking to the doctor who prescribed the medication.  Stopping may increase your risk of developing a stroke.  Refill your prescription before you run out.  After discharge, you should have regular check-up appointments with your healthcare provider that is prescribing your Eliquis.  In the future your dose may need to be changed if your kidney function or weight changes by a significant amount or as you get older.  What do you do if you miss a dose? If you miss a dose, take it as soon as you remember on the same day and resume taking twice daily.  Do not take more than one dose of ELIQUIS at the same time to make up a missed dose.  Important Safety Information A possible side effect of Eliquis is bleeding. You should call your healthcare provider right away if you experience any of the following: ? Bleeding from an injury or your nose that does not  stop. ? Unusual colored urine (red or dark brown) or unusual colored stools (red or black). ? Unusual bruising for unknown reasons. ? A serious fall or if you hit your head (even if there is no bleeding).  Some medicines may interact with Eliquis and might increase your risk of bleeding or clotting while on Eliquis. To help avoid this, consult your healthcare provider or pharmacist prior to using any new prescription or non-prescription medications, including herbals, vitamins, non-steroidal anti-inflammatory drugs (NSAIDs) and supplements.  This website has more information on Eliquis (apixaban): http://www.eliquis.com/eliquis/home

## 2016-11-22 NOTE — Progress Notes (Signed)
Family Medicine Teaching Service Daily Progress Note Intern Pager: [phone]  Patient name: Tara Flores Patient Medical record number: [id number] Date of birth: 02/27/1936 Age: 81 y.o. Gender: female  Primary Care Provider: Annita BrodAsenso, Philip, MD Consultants: Cardiology, Neurology (signed off), palliative, psychiatry Code Status: DNR  Pt Overview and Major Events to Date:  5/31 admitted for transient speech difficult in the setting of new onset Afib  Assessment and Plan: Tara Flores is a 81 y.o. female with a past medical history significant for T2DM, HTN,CAD, COPD, hypothyroidism, tobacco abuse presenting with slurred speech and altered mental status.  Atrial fibrillation with RVR, new onset. Now rate controlled. CHA2DS2-Vasc 6. Troponin 0.14>0.35>0.51 likely 2/2 demand. Patient does not want procedures. - Per Cardiology, continue cardiazem for rate control and increase as needed.  - continue eliquis  5mg  BID - Echo with normal EF and grade 3 diastolic dysfunction - Discontinued coreg and lisinopril  Hyponatremia likely 2/2 fluid overload:  Most likely hypervolemic hyponatremia given significant edema noted in her lower extremities. Some concern for CHF, CXR showed cardiomegaly and interstitial edema. Upper lung fields seen on CTA neck also showing mild pulmonary interstitial edema. Last ECHO in 2016 with normal EF 60-65%, G1DD. S/p IV Lasix x2. Na improved today at 132 corrected.  - Strict I/Os, daily weights - Cardiology following - Monitor BMP - Consider another dose IV Lasix   CAD: 01/2015 cardiac cath noting triple vessel CAD with mild to moderate diffuse disease in the heavily calcified RCA and Circumflex system. - Cards following, see above for recommendations  Slurred speech/altered mental status, resolved. Initially concerning for TIA, returned to mental baseline. CT head no acute intracranial process and CTA neck with atheromatous stenosis 40-50%.  - Carotid doppler negative - Per  neurology, needs anticoagulation. Signed off. - Tylenol for pain   Pulmonary nodule 4 mm left upper lobe pulmonary nodule seen on upper lung field on CTA neck. Patient is active smoker and does not want further work up. - Palliative consult - Consider CT chest pending GOC discussion  Left foot amputation and venous stasis, chronic Recent partial amputation of Flores foot 2/2 osteomyelitis. Was on cefazolin then transitioned to bactrim as outpatient to complete 4 week course on 11/24/16 - Bactrim q12h - Monitor - PT/OT consult  T2DM, uncontrolled. A1c 9.0. At home on Patient is on Lantus 15 U bid and SSI Novolog before each meals. Am CBG 372 - Moderate Sliding Scale Insulin.  - Lantus 15U qhs - Monitor CBGs  Hypertension, chronic. Normotensive - Discontinued coreg and lisinopril. On cardizem for afib - Continue to monitor vitals   COPD/Asthma   - Home albuterol q6prn - Monitor respiratory status  Tobacco abuse, chronic.  - 7 mg nicotine patch - Counseling on smoking cessation  Hypothyroidism TSH 0.926 - continue home levothyroxine  Concern for depression Patient endorsing depressive thoughts and readiness to die to both FM team and palliative care. Palliative with concerns about capacity to make decisions, so consulted psych.  - Psych consulted. Awaiting recommendations.   FEN/GI: heart healthy/carb modified diet PPx: eliquis  Disposition: To SNF tomorrow  Subjective:  No acute events overnight. No complaints this AM. Says she is leaving Monday regardless of whether echo has been completed or not.   Objective: Temp:  [97.5 F (36.4 C)-98.4 F (36.9 C)] 97.5 F (36.4 C) (06/03 0509) Pulse Rate:  [69-92] 69 (06/03 0509) Resp:  [20] 20 (06/03 0509) BP: (97-116)/(53-62) 108/57 (06/03 0509) SpO2:  [97 %-100 %] 98 % (  06/03 0509) Weight:  [139 lb 1.8 oz (63.1 kg)] 139 lb 1.8 oz (63.1 kg) (06/03 0509) Physical Exam: General: NAD Cardiovascular: irregularly irregular, no  murmurs appreciated Respiratory: CTAB, normal effort, No wheezes, rales or rhonchi Abdomen: soft, nontender, nondistended, +BS Extremities: Left foot s/p partial amputation. Bilateral lower extremities erythema and 1+ edema from mid shin to ankle, warm, no lesions or weeping noted.  Laboratory:  Recent Labs Lab 11/20/16 0936 11/21/16 0235 11/22/16 0534  WBC 6.6 8.1 6.9  HGB 10.3* 10.5* 10.2*  HCT 31.5* 32.0* 30.9*  PLT 362 361 389    Recent Labs Lab 11/19/16 1435  11/21/16 0235 11/21/16 1233 11/22/16 0534  NA 129*  < > 123* 124* 128*  K 4.5  < > 5.0 4.2 4.1  CL 97*  < > 88* 92* 93*  CO2 22  < > 24 25 27   BUN 21*  < > 22* 19 17  CREATININE 0.71  < > 0.78 0.68 0.62  CALCIUM 8.6*  < > 8.6* 8.8* 8.6*  PROT 7.3  --   --   --   --   BILITOT 0.8  --   --   --   --   ALKPHOS 120  --   --   --   --   ALT 10*  --   --   --   --   AST 17  --   --   --   --   GLUCOSE 89  < > 493* 322* 246*  < > = values in this interval not displayed. BNP 464.5  Imaging/Diagnostic Tests: No results found.  Pincus Large, DO 11/22/2016, 8:08 AM PGY-3, [facility] FPTS Intern pager: [phone], text pages welcome

## 2016-11-23 ENCOUNTER — Ambulatory Visit: Payer: Self-pay | Admitting: *Deleted

## 2016-11-23 ENCOUNTER — Other Ambulatory Visit: Payer: Self-pay

## 2016-11-23 DIAGNOSIS — G459 Transient cerebral ischemic attack, unspecified: Secondary | ICD-10-CM | POA: Diagnosis not present

## 2016-11-23 DIAGNOSIS — I4891 Unspecified atrial fibrillation: Secondary | ICD-10-CM | POA: Diagnosis not present

## 2016-11-23 DIAGNOSIS — F17210 Nicotine dependence, cigarettes, uncomplicated: Secondary | ICD-10-CM

## 2016-11-23 DIAGNOSIS — F4320 Adjustment disorder, unspecified: Secondary | ICD-10-CM

## 2016-11-23 DIAGNOSIS — Z7189 Other specified counseling: Secondary | ICD-10-CM | POA: Diagnosis not present

## 2016-11-23 DIAGNOSIS — Z515 Encounter for palliative care: Secondary | ICD-10-CM | POA: Diagnosis not present

## 2016-11-23 DIAGNOSIS — R4781 Slurred speech: Secondary | ICD-10-CM | POA: Diagnosis not present

## 2016-11-23 DIAGNOSIS — Z811 Family history of alcohol abuse and dependence: Secondary | ICD-10-CM

## 2016-11-23 DIAGNOSIS — J441 Chronic obstructive pulmonary disease with (acute) exacerbation: Secondary | ICD-10-CM | POA: Diagnosis not present

## 2016-11-23 LAB — GLUCOSE, CAPILLARY
GLUCOSE-CAPILLARY: 298 mg/dL — AB (ref 65–99)
GLUCOSE-CAPILLARY: 284 mg/dL — AB (ref 65–99)
Glucose-Capillary: 455 mg/dL — ABNORMAL HIGH (ref 65–99)
Glucose-Capillary: 157 mg/dL — ABNORMAL HIGH (ref 65–99)

## 2016-11-23 MED ORDER — APIXABAN 5 MG PO TABS
5.0000 mg | ORAL_TABLET | Freq: Two times a day (BID) | ORAL | 0 refills | Status: DC
Start: 2016-11-23 — End: 2020-09-04

## 2016-11-23 MED ORDER — DILTIAZEM HCL ER COATED BEADS 120 MG PO CP24: 120.0000 mg | ORAL_CAPSULE | Freq: Every day | ORAL | 0 refills | Status: DC

## 2016-11-23 NOTE — Consult Note (Signed)
[facility] Psychiatry Consult   Reason for Consult:  Capacity evaluation Referring Physician:  Dr. Mingo [name] Patient Identification: [name] MRN:  [id number] Principal Diagnosis: TIA (transient ischemic attack) Diagnosis:   Patient Active Problem List   Diagnosis Date Noted  . New onset a-fib ([place]) [I48.91] 11/20/2016  . Atrial fibrillation with RVR ([place]) [I48.91]   . COPD exacerbation ([place]) [J44.1]   . Goals of care, counseling/discussion [Z71.89]   . Palliative care by specialist [Z51.5]   . TIA (transient ischemic attack) [G45.9] 11/19/2016  . Diabetic foot ulcer ([place]) [Y81.448, L97.509] 10/10/2016  . Hypokalemia [E87.6] 10/10/2016  . Anemia due to chronic kidney disease [N18.9, D63.1] 10/10/2016  . Cellulitis in diabetic foot ([place]) [J85.631, S97.026] 10/09/2016  . Venous ulcer of left leg ([place]) [I83.029, L97.929] 07/28/2016  . Dry skin dermatitis [L85.3] 07/28/2016  . Type 2 diabetes mellitus with hyperglycemia ([place]) [E11.65] 02/06/2015  . Hip fracture ([place]) [S72.009A] 02/05/2015  . Closed right hip fracture ([place]) [S72.001A] 02/05/2015  . Abnormal EKG [R94.31] 02/05/2015  . Tobacco abuse [Z72.0] 02/05/2015  . DM2 (diabetes mellitus, type 2) ([place]) [E11.9] 02/05/2015  . Essential hypertension [I10] 02/05/2015  . Hypothyroidism [E03.9] 02/05/2015  . Coronary artery disease involving native coronary artery of native heart without angina pectoris [I25.10]     Total Time spent with patient: 1 hour  Subjective:   Tara Flores is a 81 y.o. female patient admitted with TIA and atrial fibrillation.  HPI:  Tara Flores is a 81 y.o. female with a past medical history significant for T2DM, HTN,CAD, COPD, hypothyroidism, tobacco abuse presenting with slurred speech and altered mental status.   Patient seen, chart reviewed and case discussed with the patient and her daughter who is at bedside during this evaluation. Patient reported she has been suffering with multiple  medical problems including atrial fibrillation and presented with TIA. Patient reportedly receiving her medication as recommended by her primary care physician. Patient reported she recovered her speech completely and has no neurological symptoms at this time. Patient has been working with the physical therapist and able to walk with the minimum support on the unit. Patient has intact cognitions including orientation, memory, language functions and language interpretation without any difficulties. Patient meet criteria for capacity to make her own medical decisions based of knowledge and her interest in treatment.  Past Psychiatric History: Patient has no history of psychiatric illness.  Risk to Self: Is patient at risk for suicide?: No Risk to Others:   Prior Inpatient Therapy:   Prior Outpatient Therapy:    Past Medical History:  Past Medical History:  Diagnosis Date  . Anxiety   . Arthritis   . Asthma   . CAD (coronary artery disease)   . Cardiac disease   . COPD (chronic obstructive pulmonary disease) ([place])   . Depression   . Diabetes mellitus without complication ([place])   . Hyperlipidemia   . Hypertension   . Hypothyroidism     Past Surgical History:  Procedure Laterality Date  . AMPUTATION Left 10/12/2016   Procedure: TRANSMETATARSAL AMPUTATION LEFT FOOT WITH ACHILLES TENDON LENGTHENING;  Surgeon: Edrick Kins, DPM;  Location: [facility];  Service: Podiatry;  Laterality: Left;  . APPENDECTOMY    . CARDIAC CATHETERIZATION N/A 02/05/2015   Procedure: Left Heart Cath and Coronary Angiography;  Surgeon: Burnell Blanks, MD;  Location: [facility];  Service: Cardiovascular;  Laterality: N/A;  . EYE SURGERY  2006   unsure if exact procedure  .  HIP FRACTURE SURGERY    . INTRAMEDULLARY (IM) NAIL INTERTROCHANTERIC Right 02/06/2015   Procedure: INTRAMEDULLARY (IM) NAIL RIGHT HIP;  Surgeon: Leandrew Koyanagi, MD;  Location: [facility];  Service: Orthopedics;  Laterality: Right;  . JOINT  REPLACEMENT     due to hip fracture   Family History:  Family History  Problem Relation Age of Onset  . Alcohol abuse Father   . Cancer Mother        lung  . Heart disease Mother   . Diabetes Sister   . Heart disease Sister    Family Psychiatric  History: Patient reported her daughter has been taking antidepressant medication. Social History:  History  Alcohol Use  . 0.0 oz/week    Comment: 3 times per month     History  Drug Use No    Social History   Social History  . Marital status: Widowed    Spouse name: N/A  . Number of children: N/A  . Years of education: N/A   Social History Main Topics  . Smoking status: Current Every Day Smoker    Packs/day: 1.00    Years: 63.00  . Smokeless tobacco: Never Used     Comment: has recently cut back, conts to cut back but repors she has a lot of excuses   . Alcohol use 0.0 oz/week     Comment: 3 times per month  . Drug use: No  . Sexual activity: No   Other Topics Concern  . None   Social History Narrative   Diet:      Do you drink/ eat things with caffeine? yes      Marital status:  widowed                             What year were you married ? 1969      Do you live in a house, apartment,assistred living, condo, trailer, etc.)?trailer      Is it one or more stories? no      How many persons live in your home ? none      Do you have any pets in your home ?(please list) 1 dog      Current or past profession: LPN      Do you exercise?  yes                            Type & how often: with PT      Do you have a living will? no      Do you have a DNR form? no                      If not, do you want to discuss one? yes      Do you have signed POA?HPOA forms?   no              If so, please bring to your        appointment         Additional Social History:    Allergies:   Allergies  Allergen Reactions  . Brethine [Terbutaline]     Made patient confused  . Keflex [Cephalexin] Nausea Only    Loss of  appetite    Labs:  Results for orders placed or performed during the hospital encounter of 11/19/16 (from the past 48 hour(s))  Glucose, capillary  Status: Abnormal   Collection Time: 11/21/16  4:43 PM  Result Value Ref Range   Glucose-Capillary 252 (H) 65 - 99 mg/dL  Glucose, capillary     Status: Abnormal   Collection Time: 11/21/16  9:15 PM  Result Value Ref Range   Glucose-Capillary 102 (H) 65 - 99 mg/dL   Comment 1 Notify RN    Comment 2 Document in Chart   CBC     Status: Abnormal   Collection Time: 11/22/16  5:34 AM  Result Value Ref Range   WBC 6.9 4.0 - 10.5 K/uL   RBC 3.36 (L) 3.87 - 5.11 MIL/uL   Hemoglobin 10.2 (L) 12.0 - 15.0 g/dL   HCT 30.9 (L) 36.0 - 46.0 %   MCV 92.0 78.0 - 100.0 fL   MCH 30.4 26.0 - 34.0 pg   MCHC 33.0 30.0 - 36.0 g/dL   RDW 13.9 11.5 - 15.5 %   Platelets 389 150 - 400 K/uL  Basic metabolic panel     Status: Abnormal   Collection Time: 11/22/16  5:34 AM  Result Value Ref Range   Sodium 128 (L) 135 - 145 mmol/L   Potassium 4.1 3.5 - 5.1 mmol/L   Chloride 93 (L) 101 - 111 mmol/L   CO2 27 22 - 32 mmol/L   Glucose, Bld 246 (H) 65 - 99 mg/dL   BUN 17 6 - 20 mg/dL   Creatinine, Ser 0.62 0.44 - 1.00 mg/dL   Calcium 8.6 (L) 8.9 - 10.3 mg/dL   GFR calc non Af Amer >60 >60 mL/min   GFR calc Af Amer >60 >60 mL/min    Comment: (NOTE) The eGFR has been calculated using the CKD EPI equation. This calculation has not been validated in all clinical situations. eGFR's persistently <60 mL/min signify possible Chronic Kidney Disease.    Anion gap 8 5 - 15  Glucose, capillary     Status: Abnormal   Collection Time: 11/22/16  6:12 AM  Result Value Ref Range   Glucose-Capillary 214 (H) 65 - 99 mg/dL   Comment 1 Notify RN    Comment 2 Document in Chart   Glucose, capillary     Status: Abnormal   Collection Time: 11/22/16 11:26 AM  Result Value Ref Range   Glucose-Capillary 239 (H) 65 - 99 mg/dL  Sodium, urine, random     Status: None    Collection Time: 11/22/16 11:30 AM  Result Value Ref Range   Sodium, Ur 49 mmol/L  Glucose, capillary     Status: Abnormal   Collection Time: 11/22/16  4:15 PM  Result Value Ref Range   Glucose-Capillary 347 (H) 65 - 99 mg/dL  Glucose, capillary     Status: Abnormal   Collection Time: 11/22/16  9:01 PM  Result Value Ref Range   Glucose-Capillary 363 (H) 65 - 99 mg/dL   Comment 1 Notify RN    Comment 2 Document in Chart   Glucose, capillary     Status: Abnormal   Collection Time: 11/23/16  6:03 AM  Result Value Ref Range   Glucose-Capillary 157 (H) 65 - 99 mg/dL   Comment 1 Notify RN    Comment 2 Document in Chart   Glucose, capillary     Status: Abnormal   Collection Time: 11/23/16 11:12 AM  Result Value Ref Range   Glucose-Capillary 298 (H) 65 - 99 mg/dL   Comment 1 Notify RN    Comment 2 Document in Chart     Current Facility-Administered Medications  Medication Dose Route Frequency Provider Last Rate Last Dose  . acetaminophen (TYLENOL) tablet 650 mg  650 mg Oral Q6H PRN Diallo, Abdoulaye, MD   650 mg at 11/21/16 2113   Or  . acetaminophen (TYLENOL) suppository 650 mg  650 mg Rectal Q6H PRN Diallo, Abdoulaye, MD      . albuterol (PROVENTIL) (2.5 MG/3ML) 0.083% nebulizer solution 3 mL  3 mL Inhalation Q6H PRN Rogue Bussing, MD      . ALPRAZolam Duanne Moron) tablet 0.25 mg  0.25 mg Oral BID PRN Jannette Fogo, NP   0.25 mg at 11/22/16 2204  . apixaban (ELIQUIS) tablet 5 mg  5 mg Oral BID Orson Eva J, DO   5 mg at 11/23/16 1029  . diltiazem (CARDIZEM CD) 24 hr capsule 120 mg  120 mg Oral q1800 Jerline Pain, MD   120 mg at 11/22/16 1719  . insulin aspart (novoLOG) injection 0-15 Units  0-15 Units Subcutaneous TID WC Rogue Bussing, MD   8 Units at 11/23/16 1219  . insulin glargine (LANTUS) injection 15 Units  15 Units Subcutaneous QHS Bufford Lope, DO   15 Units at 11/22/16 2209  . levothyroxine (SYNTHROID, LEVOTHROID) tablet 150 mcg  150 mcg Oral QAC  breakfast Diallo, Abdoulaye, MD   150 mcg at 11/23/16 1029  . nicotine (NICODERM CQ - dosed in mg/24 hr) patch 7 mg  7 mg Transdermal Q24H Tonette Bihari, MD   7 mg at 11/22/16 2204  . sodium chloride flush (NS) 0.9 % injection 3 mL  3 mL Intravenous Q12H Diallo, Abdoulaye, MD   3 mL at 11/23/16 1029  . sulfamethoxazole-trimethoprim (BACTRIM DS,SEPTRA DS) 800-160 MG per tablet 1 tablet  1 tablet Oral Q12H Diallo, Abdoulaye, MD   1 tablet at 11/23/16 1029    Musculoskeletal: Strength & Muscle Tone: within normal limits Gait & Station: normal Patient leans: Front  Psychiatric Specialty Exam: Physical Exam as per history and physical   ROS  No Fever-chills, No Headache, No changes with Vision or hearing, reports vertigo No problems swallowing food or Liquids, No Chest pain, Cough or Shortness of Breath, No Abdominal pain, No Nausea or Vommitting, Bowel movements are regular, No Blood in stool or Urine, No dysuria, No new skin rashes or bruises, No new joints pains-aches,  No new weakness, tingling, numbness in any extremity, No recent weight gain or loss, No polyuria, polydypsia or polyphagia,  A full 10 point Review of Systems was done, except as stated above, all other Review of Systems were negative.  Blood pressure (!) 117/56, pulse 96, temperature 97.7 F (36.5 C), temperature source Oral, resp. rate 20, height 5' 4"  (1.626 m), weight 61 kg (134 lb 8 oz), SpO2 96 %.Body mass index is 23.09 kg/m.  General Appearance: Casual  Eye Contact:  Good  Speech:  Clear and Coherent  Volume:  Normal  Mood:  Euthymic  Affect:  Appropriate and Congruent  Thought Process:  Coherent and Goal Directed  Orientation:  Full (Time, Place, and Person)  Thought Content:  WDL  Suicidal Thoughts:  No  Homicidal Thoughts:  No  Memory:  Immediate;   Good Recent;   Fair Remote;   Fair  Judgement:  Intact  Insight:  Good  Psychomotor Activity:  Normal  Concentration:  Concentration: Good  and Attention Span: Good  Recall:  Good  Fund of Knowledge:  Good  Language:  Good  Akathisia:  Negative  Handed:  Right  AIMS (if indicated):  Assets:  Communication Skills Desire for Improvement Financial Resources/Insurance Housing Leisure Time Resilience Social Support Talents/Skills Transportation  ADL's:  Intact  Cognition:  WNL  Sleep:        Treatment Plan Summary: 81 years old female with multiple medical problems including atrial fibrillation presented with the TIA and altered mental status which was improved during this hospitalization with her current medication management and therapy  Patient has no safety concerns Patient meets criteria for capacity to make her own medical decisions and living arrangements based on my evaluation today. Patient daughter who agrees with the recommendation. Appreciate psychiatric consultation and we sign off as of today Please contact [phone] or [phone] if needs further assistance   Disposition: Supportive therapy provided about ongoing stressors.  Ambrose Finland, MD 11/23/2016 3:39 PM

## 2016-11-23 NOTE — Care Management Important Message (Signed)
Important Message  Patient Details  Name: Sherle PoeBetty L Saulter MRN: [id number] Date of Birth: 04/29/1936   Medicare Important Message Given:  Yes    Lalitha Ilyas 11/23/2016, 12:04 PM

## 2016-11-23 NOTE — Progress Notes (Signed)
CSW consulted for SNF placement. CSW engaged in discussion with admissions at [facility], to discuss information about patient's copays with insurance plan. Larene Beach at [facility] explained that she had already been in communication with patient's daughter, Lousie, to explain the copay information, and that the family cannot afford the copay for SNF at this time. Larene Beach acknowledged that she has been in communication with the family for a while, and that she had informed the daughter about applying for Medicaid previously but the daughter had not started the application.   CSW then met with daughter and patient at bedside to review SNF information and confirm that the family wanted to take the patient home, because they could not afford the copays at this time. CSW obtained information about the patient having a sitter and home health therapies previously in place prior to hospital admission, and that they would like to continue with that. CSW alerted RNCM for home health services and ordering of any medical equipment.  Patient's daughter requested information about beginning the healthcare power of attorney process, and CSW explained that pastoral care would come to the room to review information with the patient.  CSW no longer needed to facilitate discharge to SNF, as patient will be going home. CSW signing off.  Laveda Abbe LCSW [phone]

## 2016-11-23 NOTE — Progress Notes (Signed)
Physical Therapy Treatment Patient Details Name: Tara Flores MRN: [id number] DOB: 11/25/1935 Today's Date: 11/23/2016    History of Present Illness 81 y.o. female  with past medical history of HTN, CAD, COPD, DM2 uncontrolled, hypothyroidism, legally blind, and prior osteomyelitis with partial left foot amputation on 10/12/16 presented to the ED from home with slurred speech and AMS. On arrival to the ED she was back to her baseline. Work-up revealed new onset A.Fib with RVR    PT Comments    Pt progressing towards physical therapy goals. Was able to perform transfers and ambulation with  Min guard to supervision for safety. Pt motivated to regain independence, and reports that she will have 24 hour assist at home. Will continue to follow and progress as able per POC.   Follow Up Recommendations  Home health PT;Supervision/Assistance - 24 hour     Equipment Recommendations  None recommended by PT    Recommendations for Other Services       Precautions / Restrictions Precautions Precautions: Fall (revent transmet amputation - unsure of WB status) Required Braces or Orthoses: Other Brace/Splint Other Brace/Splint: Cam boot Restrictions Weight Bearing Restrictions: No Other Position/Activity Restrictions: unsure - per pt she is to wear the boot when walking but does not know how to put it on (she has had it for over a month). pt also stating that she does not wear the CAM boot much at home (only when PT is working with her)    Mobility  Bed Mobility               General bed mobility comments: OOB in chair  Transfers Overall transfer level: Needs assistance Equipment used: Rolling walker (2 wheeled) Transfers: Sit to/from Stand Sit to Stand: Supervision         General transfer comment: Pt asking for therapist not to assist, wants to try on her own. Close supervision provided for safety - no assist required. VC's for hand placement on seated surface for safety.    Ambulation/Gait Ambulation/Gait assistance: Min guard Ambulation Distance (Feet): 75 Feet Assistive device: Rolling walker (2 wheeled) Gait Pattern/deviations: Step-through pattern;Decreased step length - right;Decreased step length - left;Decreased stride length;Trunk flexed Gait velocity: decreased Gait velocity interpretation: Below normal speed for age/gender General Gait Details: Pt ambulated with CAM boot on, mild instability but no LOB or need for physical assistance, min guard for safety. VC's provided for improved posture and forward gaze. Pt able to make corrective changes but unable to maintain.    Stairs            Wheelchair Mobility    Modified Rankin (Stroke Patients Only)       Balance Overall balance assessment: Needs assistance Sitting-balance support: Feet supported Sitting balance-Leahy Scale: Good     Standing balance support: During functional activity Standing balance-Leahy Scale: Fair                              Cognition Arousal/Alertness: Awake/alert Behavior During Therapy: WFL for tasks assessed/performed Overall Cognitive Status: Impaired/Different from baseline Area of Impairment: Memory                     Memory: Decreased short-term memory         General Comments: Pt making jokes throughout session. Was fixated on putting pants on before ambulating, and once they were on and pt stood up, she forgot they were on and attempted  to begin walking before pulling her pants up.       Exercises      General Comments        Pertinent Vitals/Pain Pain Assessment: No/denies pain    Home Living                      Prior Function            PT Goals (current goals can now be found in the care plan section) Acute Rehab PT Goals Patient Stated Goal: Be independent at home PT Goal Formulation: With patient Time For Goal Achievement: 12/04/16 Potential to Achieve Goals: Good Progress towards PT  goals: Progressing toward goals    Frequency    Min 3X/week      PT Plan Discharge plan needs to be updated    Co-evaluation              AM-PAC PT "6 Clicks" Daily Activity  Outcome Measure  Difficulty turning over in bed (including adjusting bedclothes, sheets and blankets)?: A Little Difficulty moving from lying on back to sitting on the side of the bed? : A Little Difficulty sitting down on and standing up from a chair with arms (e.g., wheelchair, bedside commode, etc,.)?: A Little Help needed moving to and from a bed to chair (including a wheelchair)?: A Little Help needed walking in hospital room?: A Little Help needed climbing 3-5 steps with a railing? : A Lot 6 Click Score: 17    End of Session Equipment Utilized During Treatment: Gait belt (CAM boot) Activity Tolerance: Patient tolerated treatment well Patient left: in chair;with call bell/phone within reach;with chair alarm set Nurse Communication: Mobility status PT Visit Diagnosis: Other abnormalities of gait and mobility (R26.89)     Time: [id number] PT Time Calculation (min) (ACUTE ONLY): 18 min  Charges:  $Gait Training: 8-22 mins                    G Codes:       Tara Flores, PT, DPT Acute Rehabilitation Services Pager: [phone]    Tara Flores 11/23/2016, 2:38 PM

## 2016-11-23 NOTE — Progress Notes (Signed)
Pt being discharged from hospital per orders from MD. Pt and family educated on discharge instructions. Pt and family verbalized understanding of instructions. All questions and concerns were addressed. Pt's IV was removed prior to discharge. Pt exited hospital via wheelchair. 

## 2016-11-23 NOTE — Progress Notes (Signed)
Family Medicine Teaching Service Daily Progress Note Intern Pager: [phone]  Patient name: Tara Flores Medical record number: [id number] Date of birth: January 16, 1936 Age: 81 y.o. Gender: female  Primary Care Provider: Annita Brod, MD Consultants: Cardiology, Neurology (signed off), palliative, psychiatry Code Status: DNR  Pt Overview and Major Events to Date:  5/31 admitted for transient speech difficult in the setting of new onset Afib  Assessment and Plan: Tara Flores is a 81 y.o. female with a past medical history significant for T2DM, HTN,CAD, COPD, hypothyroidism, tobacco abuse presenting with slurred speech and altered mental status.  Atrial fibrillation with RVR, new onset. Rate controlled. CHA2DS2-Vasc 6. Patient does not want procedures. - Per Cardiology, continue rate control and anticoagulation. Signed off  - continue cardizem 120mg  24hr tablet and  eliquis 5mg  BID  Hyponatremia likely 2/2 fluid overload, resolved:  S/p IV Lasix x2. Na improved to 132 corrected. ECHO showed normal EF 60-65%, G3DD. At dry weight. - Strict I/Os, daily weights - Monitor BMP  CAD: 01/2015 cardiac cath noting triple vessel CAD with mild to moderate diffuse disease in the heavily calcified RCA and Circumflex system. - Patient does not want procedures. Per cardiology, no plans for further ischemia evaluation.  Slurred speech/altered mental status, resolved. Initially concerning for TIA, returned to mental baseline. CT head no acute intracranial process and CTA neck with atheromatous stenosis 40-50%.  - Carotid doppler negative - Per neurology, needs anticoagulation. Signed off. - Tylenol for pain   Pulmonary nodule 4 mm left upper lobe pulmonary nodule seen on upper lung field on CTA neck. Patient is active smoker and does not want further work up. - Palliative consulted - Consider CT chest pending GOC discussion  Left foot amputation and venous stasis, chronic Recent partial amputation of  L foot 2/2 osteomyelitis. Was on cefazolin then transitioned to bactrim as outpatient to complete 4 week course on 11/24/16 - Bactrim q12h - Monitor - PT/OT recommended SNF.  T2DM, uncontrolled. A1c 9.0. At home on Patient is on Lantus 15 U bid and SSI Novolog before each meals. Am CBG 157 - Moderate Sliding Scale Insulin.  - Lantus 15U qhs - Monitor CBGs  Hypertension, chronic. Normotensive - Discontinued coreg and lisinopril. On cardizem for afib - Continue to monitor vitals   COPD/Asthma   - Home albuterol q6prn - Monitor respiratory status  Tobacco abuse, chronic.  - 7 mg nicotine patch - Counseling on smoking cessation  Hypothyroidism TSH 0.926 - continue home levothyroxine  Concern for depression Patient endorsing depressive thoughts and readiness to die to both FM team and palliative care. Palliative with concerns about capacity to make decisions, so consulted psych.  - Psych consulted. Awaiting recommendations.   FEN/GI: heart healthy/carb modified diet PPx: eliquis  Disposition: pending psychiatry evaluation  Subjective:  No acute events overnight. Patient states she cannot afford copay for SNF and wants to go home but does not have 24h care. Per patient's daughter, patient seems to less mentally clear and making questionable decisions.  Objective: Temp:  [97.8 F (36.6 C)-98.5 F (36.9 C)] 97.8 F (36.6 C) (06/04 0501) Pulse Rate:  [66-97] 88 (06/04 0501) Resp:  [18] 18 (06/04 0501) BP: (105-129)/(57-75) 105/57 (06/04 0501) SpO2:  [93 %-100 %] 98 % (06/04 0501) Weight:  [61 kg (134 lb 8 oz)] 61 kg (134 lb 8 oz) (06/04 0636) Physical Exam: General: NAD Cardiovascular: irregularly irregular, no murmurs appreciated Respiratory: CTAB, normal effort, No wheezes, rales or rhonchi Abdomen: soft, nontender, nondistended, +BS Extremities:  Left foot s/p partial amputation. Bilateral lower extremities erythema and 1+ edema from mid shin to ankle, warm, no lesions  or weeping noted.  Laboratory:  Recent Labs Lab 11/20/16 0936 11/21/16 0235 11/22/16 0534  WBC 6.6 8.1 6.9  HGB 10.3* 10.5* 10.2*  HCT 31.5* 32.0* 30.9*  PLT 362 361 389    Recent Labs Lab 11/19/16 1435  11/21/16 0235 11/21/16 1233 11/22/16 0534  NA 129*  < > 123* 124* 128*  K 4.5  < > 5.0 4.2 4.1  CL 97*  < > 88* 92* 93*  CO2 22  < > 24 25 27   BUN 21*  < > 22* 19 17  CREATININE 0.71  < > 0.78 0.68 0.62  CALCIUM 8.6*  < > 8.6* 8.8* 8.6*  PROT 7.3  --   --   --   --   BILITOT 0.8  --   --   --   --   ALKPHOS 120  --   --   --   --   ALT 10*  --   --   --   --   AST 17  --   --   --   --   GLUCOSE 89  < > 493* 322* 246*  < > = values in this interval not displayed.   Imaging/Diagnostic Tests: No results found.  Leland HerYoo, Yeni Jiggetts J, DO 11/23/2016, 6:41 AM PGY-1, [facility] FPTS Intern pager: [phone], text pages welcome

## 2016-11-23 NOTE — Care Management Note (Addendum)
Case Management Note  Patient Details  Name: Tara Flores MRN: [id number] Date of Birth: 02/11/1936  Subjective/Objective:                    Action/Plan: [facility]. [facility] and [facility] prior to [facility] and would like to continue with [facility]. CM spoke to Adacia with [facility] and they will [facility]. Pt will be going to daughters home at [facility] [place], [phone]. Pts daughter also interested in having Palliative care follow her mother at home. CM consulted and called [facility] (per daughters request) and arranged home Palliative follow up. Pt with orders for wheelchair. Patient currently is refusing this.  Pt started on Eliquis. Benefits check revealed this will cost patient $45 month. She feels she can afford this amount. CM provided her with 30 day free card.  She has transportation home.    Expected Discharge Date:  11/23/16               Expected Discharge Plan:  Home w Home Health Services  In-House Referral:     Discharge planning Services  CM Consult  Post Acute Care Choice:  Durable Medical Equipment (Pt now refusing wheelchair) Choice offered to:  Patient, Adult Children  DME Arranged:    DME Agency:     HH Arranged:  PT, OT, Nurse's Aide HH Agency:  [facility]  Status of Service:  Completed, signed off  If discussed at Long Length of Stay Meetings, dates discussed:    Additional Comments:  Kermit BaloKelli F Dylen Mcelhannon, RN 11/23/2016, 4:07 PM

## 2016-11-23 NOTE — Consult Note (Signed)
   [facility]   11/23/2016  Sherle PoeBetty L Flores 06/01/1936 [id number]     [facility]. Chart reviewed. Went to bedside. Daughter not at bedside and patient was asleep in chair.   Dr. Jacqulynn CadetAseno is not at [facility] Provider. Spoke with [facility] LCSW to discuss. Will make inpatient RNCM aware as well.   Due to patient changing Primary Care MD to a [facility] Provider, she is no longer eligible for [facility] services.   Tara NobleAtika Eward Rutigliano, MSN-Ed, RN,BSN [facility] Care Management Hospital Liaison [phone]

## 2016-11-23 NOTE — Progress Notes (Signed)
Inpatient Diabetes Program Recommendations  AACE/ADA: New Consensus Statement on Inpatient Glycemic Control (2015)  Target Ranges:  Prepandial:   less than 140 mg/dL      Peak postprandial:   less than 180 mg/dL (1-2 hours)      Critically ill patients:  140 - 180 mg/dL   Lab Results  Component Value Date   GLUCAP 157 (H) 11/23/2016   HGBA1C 9.0 (H) 11/19/2016    Review of Glycemic Control:  Results for Tara Flores, Laretha L (MRN [id number]) as of 11/23/2016 10:46  Ref. Range 11/22/2016 06:12 11/22/2016 11:26 11/22/2016 16:15 11/22/2016 21:01 11/23/2016 06:03  Glucose-Capillary Latest Ref Range: 65 - 99 mg/dL 102214 (H) 725239 (H) 366347 (H) 363 (H) 157 (H)   Inpatient Diabetes Program Recommendations:    If appropriate, consider adding Novolog meal coverage 4 units tid with meals- hold if patient eats less than 50%.     Thanks, Beryl MeagerJenny Aikeem Lilley, RN, BC-ADM Inpatient Diabetes Coordinator Pager [phone] (8a-5p)

## 2016-11-23 NOTE — Patient Outreach (Signed)
Care Coordination: Notification that [facility]. Will close to nursing.  Therefore [facility].   [facility] SW to follow up with daughter to inform of case closure.   Rowe PavyAmanda Aveen Stansel, RN, BSN, CEN [facility] Care Coordinator [phone]

## 2016-11-25 ENCOUNTER — Other Ambulatory Visit: Payer: Self-pay | Admitting: *Deleted

## 2016-11-25 NOTE — Patient Outreach (Signed)
[facility]) Care Management  11/25/2016  Sherle PoeBetty L Dakin 04/09/1936 [id number]  CSW spoke with patient's daughter by phone today and have advised plans to close referral with [facility] as patient is no longer eligible given the change of PCP. Daughter understands, is agreeable and is also appreciative of [facility]'s support.  CSW will close case and advise PCP and [facility] team.   Reece LevyJanet Mlissa Tamayo, MSW, LCSW Clinical Social Worker  [facility] [phone]

## 2016-11-30 ENCOUNTER — Ambulatory Visit: Payer: Medicare Other | Admitting: Podiatry

## 2016-12-14 ENCOUNTER — Ambulatory Visit (INDEPENDENT_AMBULATORY_CARE_PROVIDER_SITE_OTHER): Payer: Medicare Other | Admitting: Podiatry

## 2016-12-14 DIAGNOSIS — L97522 Non-pressure chronic ulcer of other part of left foot with fat layer exposed: Secondary | ICD-10-CM | POA: Diagnosis not present

## 2016-12-14 DIAGNOSIS — E0842 Diabetes mellitus due to underlying condition with diabetic polyneuropathy: Secondary | ICD-10-CM

## 2016-12-14 DIAGNOSIS — Z89432 Acquired absence of left foot: Secondary | ICD-10-CM

## 2016-12-14 DIAGNOSIS — I70245 Atherosclerosis of native arteries of left leg with ulceration of other part of foot: Secondary | ICD-10-CM

## 2016-12-17 NOTE — Progress Notes (Signed)
Subjective: Patient presents today status post left midfoot amputation. Date of surgery 10/12/2016. She has no new complaints at this time.  Objective: Wound #1 noted to the left foot indications measuring 0.3 x 2.5 x 0.1 cm (LxWxD).   To the noted ulceration(s), there is no eschar. There is a moderate amount of slough, fibrin, and necrotic tissue noted. Granulation tissue and wound base is red. There is a minimal amount of serosanguineous drainage noted. There is no exposed bone muscle-tendon ligament or joint. There is no malodor. Periwound integrity is intact. Skin is warm, dry and supple bilateral lower extremities.  Radiographic Exam:  osteotomies sites appear to be stable with routine healing.  Assessment: Status post left midfoot amputation. Date of surgery 10/12/2016.  Plan of care: Patient evaluated. Staples removed. Medically necessary excisional debridement including subcutaneous tissue was performed using a tissue nipper and a chisel blade. Excisional debridement of all the necrotic nonviable tissue down to healthy bleeding viable tissue was performed with post-debridement measurements same as pre-. The wound was cleansed and dry sterile dressing applied. Return to clinic in 2 weeks.   Felecia ShellingBrent M. Evans, DPM [facility]  Dr. Felecia ShellingBrent M. Evans, DPM    [address]                                        [place], [place] [address]                Office [phone]  Fax [phone]) [phone]

## 2016-12-21 ENCOUNTER — Other Ambulatory Visit: Payer: Self-pay | Admitting: Internal Medicine

## 2016-12-24 ENCOUNTER — Other Ambulatory Visit: Payer: Self-pay | Admitting: Internal Medicine

## 2016-12-30 ENCOUNTER — Ambulatory Visit: Payer: Medicare Other | Admitting: Podiatry

## 2017-01-04 ENCOUNTER — Other Ambulatory Visit: Payer: Self-pay | Admitting: Internal Medicine

## 2017-01-11 ENCOUNTER — Ambulatory Visit: Payer: Medicare Other | Admitting: Podiatry

## 2017-01-21 ENCOUNTER — Other Ambulatory Visit: Payer: Self-pay | Admitting: Internal Medicine

## 2017-01-21 ENCOUNTER — Telehealth: Payer: Self-pay

## 2017-01-21 NOTE — Telephone Encounter (Signed)
[facility] to [facility] status for patient. Currently patient still has an open wound on the dorsal aspect of her foot, and her CAM boot is nonfunctional. She is ambulating with wheelchair and transferring by putting weight on her heel. Advised [facility] to have patient at non-weight bearing status to Lt foot at this time, continue with current wound care orders.   Patient has been non-compliant with follow up appts, advised that she will need to be re-evaluated by Dr Logan BoresEvans as soon as possible to determine further weight bearing status and condition of wound.   Schedulers to call and make an appt

## 2017-02-09 ENCOUNTER — Inpatient Hospital Stay (HOSPITAL_COMMUNITY)
Admission: EM | Admit: 2017-02-09 | Discharge: 2017-02-16 | DRG: 292 | Disposition: A | Discharge status: Home Health | Payer: Medicare Other | Attending: Internal Medicine | Admitting: Internal Medicine

## 2017-02-09 ENCOUNTER — Emergency Department (HOSPITAL_COMMUNITY): Payer: Medicare Other

## 2017-02-09 ENCOUNTER — Encounter (HOSPITAL_COMMUNITY): Payer: Self-pay

## 2017-02-09 DIAGNOSIS — I509 Heart failure, unspecified: Secondary | ICD-10-CM

## 2017-02-09 DIAGNOSIS — I4891 Unspecified atrial fibrillation: Secondary | ICD-10-CM | POA: Diagnosis present

## 2017-02-09 DIAGNOSIS — E039 Hypothyroidism, unspecified: Secondary | ICD-10-CM | POA: Diagnosis present

## 2017-02-09 DIAGNOSIS — F17200 Nicotine dependence, unspecified, uncomplicated: Secondary | ICD-10-CM

## 2017-02-09 DIAGNOSIS — J441 Chronic obstructive pulmonary disease with (acute) exacerbation: Secondary | ICD-10-CM | POA: Diagnosis not present

## 2017-02-09 DIAGNOSIS — Z7901 Long term (current) use of anticoagulants: Secondary | ICD-10-CM

## 2017-02-09 DIAGNOSIS — I5033 Acute on chronic diastolic (congestive) heart failure: Secondary | ICD-10-CM | POA: Diagnosis not present

## 2017-02-09 DIAGNOSIS — E876 Hypokalemia: Secondary | ICD-10-CM | POA: Diagnosis present

## 2017-02-09 DIAGNOSIS — E785 Hyperlipidemia, unspecified: Secondary | ICD-10-CM | POA: Diagnosis present

## 2017-02-09 DIAGNOSIS — I110 Hypertensive heart disease with heart failure: Secondary | ICD-10-CM | POA: Diagnosis not present

## 2017-02-09 DIAGNOSIS — F329 Major depressive disorder, single episode, unspecified: Secondary | ICD-10-CM | POA: Diagnosis present

## 2017-02-09 DIAGNOSIS — I10 Essential (primary) hypertension: Secondary | ICD-10-CM | POA: Diagnosis present

## 2017-02-09 DIAGNOSIS — H548 Legal blindness, as defined in USA: Secondary | ICD-10-CM | POA: Diagnosis present

## 2017-02-09 DIAGNOSIS — I2510 Atherosclerotic heart disease of native coronary artery without angina pectoris: Secondary | ICD-10-CM | POA: Diagnosis present

## 2017-02-09 DIAGNOSIS — Z794 Long term (current) use of insulin: Secondary | ICD-10-CM

## 2017-02-09 DIAGNOSIS — E1165 Type 2 diabetes mellitus with hyperglycemia: Secondary | ICD-10-CM | POA: Diagnosis present

## 2017-02-09 DIAGNOSIS — Z66 Do not resuscitate: Secondary | ICD-10-CM | POA: Diagnosis present

## 2017-02-09 DIAGNOSIS — J439 Emphysema, unspecified: Secondary | ICD-10-CM

## 2017-02-09 DIAGNOSIS — E11649 Type 2 diabetes mellitus with hypoglycemia without coma: Secondary | ICD-10-CM | POA: Diagnosis present

## 2017-02-09 DIAGNOSIS — F17210 Nicotine dependence, cigarettes, uncomplicated: Secondary | ICD-10-CM | POA: Diagnosis present

## 2017-02-09 LAB — I-STAT TROPONIN, ED: TROPONIN I, POC: 0 ng/mL (ref 0.00–0.08)

## 2017-02-09 LAB — COMPREHENSIVE METABOLIC PANEL
ALBUMIN: 2.9 g/dL — AB (ref 3.5–5.0)
ALT: 17 U/L (ref 14–54)
ANION GAP: 7 (ref 5–15)
AST: 19 U/L (ref 15–41)
Alkaline Phosphatase: 145 U/L — ABNORMAL HIGH (ref 38–126)
BUN: 19 mg/dL (ref 6–20)
CHLORIDE: 105 mmol/L (ref 101–111)
CO2: 28 mmol/L (ref 22–32)
Calcium: 8.5 mg/dL — ABNORMAL LOW (ref 8.9–10.3)
Creatinine, Ser: 0.61 mg/dL (ref 0.44–1.00)
GFR calc Af Amer: >60 mL/min (ref >60)
GFR calc non Af Amer: >60 mL/min (ref >60)
GLUCOSE: 50 mg/dL — AB (ref 65–99)
POTASSIUM: 3.4 mmol/L — AB (ref 3.5–5.1)
Sodium: 140 mmol/L (ref 135–145)
TOTAL PROTEIN: 7.1 g/dL (ref 6.5–8.1)
Total Bilirubin: 0.5 mg/dL (ref 0.3–1.2)

## 2017-02-09 LAB — CBC WITH DIFFERENTIAL/PLATELET
BASOS ABS: 0.1 10*3/uL (ref 0.0–0.1)
Basophils Relative: 2 %
Eosinophils Absolute: 0.4 10*3/uL (ref 0.0–0.7)
Eosinophils Relative: 7 %
HEMATOCRIT: 38.5 % (ref 36.0–46.0)
Hemoglobin: 12.4 g/dL (ref 12.0–15.0)
LYMPHS ABS: 1 10*3/uL (ref 0.7–4.0)
LYMPHS PCT: 17 %
MCH: 29.7 pg (ref 26.0–34.0)
MCHC: 32.2 g/dL (ref 30.0–36.0)
MCV: 92.3 fL (ref 78.0–100.0)
MONO ABS: 0.6 10*3/uL (ref 0.1–1.0)
MONOS PCT: 10 %
NEUTROS ABS: 3.7 10*3/uL (ref 1.7–7.7)
Neutrophils Relative %: 64 %
Platelets: 303 10*3/uL (ref 150–400)
RBC: 4.17 MIL/uL (ref 3.87–5.11)
RDW: 13.8 % (ref 11.5–15.5)
WBC: 5.8 10*3/uL (ref 4.0–10.5)

## 2017-02-09 LAB — URINALYSIS, ROUTINE W REFLEX MICROSCOPIC
BILIRUBIN URINE: NEGATIVE
Glucose, UA: NEGATIVE mg/dL
Hgb urine dipstick: NEGATIVE
Ketones, ur: NEGATIVE mg/dL
Nitrite: NEGATIVE
Protein, ur: 30 mg/dL — AB
SPECIFIC GRAVITY, URINE: 1.009 (ref 1.005–1.030)
pH: 7 (ref 5.0–8.0)

## 2017-02-09 LAB — GLUCOSE, CAPILLARY: Glucose-Capillary: 168 mg/dL — ABNORMAL HIGH (ref 65–99)

## 2017-02-09 LAB — BRAIN NATRIURETIC PEPTIDE: B NATRIURETIC PEPTIDE 5: 471.3 pg/mL — AB (ref 0.0–100.0)

## 2017-02-09 LAB — PHOSPHORUS: Phosphorus: 3.8 mg/dL (ref 2.5–4.6)

## 2017-02-09 LAB — MAGNESIUM: Magnesium: 1.9 mg/dL (ref 1.7–2.4)

## 2017-02-09 LAB — I-STAT CG4 LACTIC ACID, ED: Lactic Acid, Venous: 1.11 mmol/L (ref 0.5–1.9)

## 2017-02-09 MED ORDER — NITROGLYCERIN 0.4 MG SL SUBL: 0.4000 mg | SUBLINGUAL_TABLET | SUBLINGUAL | Status: DC | PRN

## 2017-02-09 MED ORDER — ACETAMINOPHEN 325 MG PO TABS
650.0000 mg | ORAL_TABLET | Freq: Four times a day (QID) | ORAL | Status: DC | PRN
  Filled 2017-02-09: qty 2

## 2017-02-09 MED ORDER — POTASSIUM CHLORIDE CRYS ER 20 MEQ PO TBCR
40.0000 meq | EXTENDED_RELEASE_TABLET | Freq: Once | ORAL | Status: AC
  Administered 2017-02-09: 40 meq via ORAL
  Filled 2017-02-09: qty 2

## 2017-02-09 MED ORDER — IPRATROPIUM BROMIDE 0.02 % IN SOLN
0.5000 mg | Freq: Four times a day (QID) | RESPIRATORY_TRACT | Status: DC
  Administered 2017-02-10 – 2017-02-12 (×11): 0.5 mg via RESPIRATORY_TRACT
  Filled 2017-02-09 (×12): qty 2.5

## 2017-02-09 MED ORDER — LEVALBUTEROL HCL 0.63 MG/3ML IN NEBU
0.6300 mg | INHALATION_SOLUTION | Freq: Four times a day (QID) | RESPIRATORY_TRACT | Status: DC
  Administered 2017-02-10 – 2017-02-12 (×11): 0.63 mg via RESPIRATORY_TRACT
  Filled 2017-02-09 (×12): qty 3

## 2017-02-09 MED ORDER — ONDANSETRON HCL 4 MG/2ML IJ SOLN: 4.0000 mg | Freq: Four times a day (QID) | INTRAMUSCULAR | Status: DC | PRN

## 2017-02-09 MED ORDER — FUROSEMIDE 10 MG/ML IJ SOLN
40.0000 mg | Freq: Once | INTRAMUSCULAR | Status: AC
  Administered 2017-02-09: 40 mg via INTRAVENOUS
  Filled 2017-02-09: qty 4

## 2017-02-09 MED ORDER — MAGNESIUM SULFATE 2 GM/50ML IV SOLN
2.0000 g | Freq: Once | INTRAVENOUS | Status: AC
  Administered 2017-02-09: 2 g via INTRAVENOUS
  Filled 2017-02-09: qty 50

## 2017-02-09 MED ORDER — APIXABAN 5 MG PO TABS
5.0000 mg | ORAL_TABLET | Freq: Two times a day (BID) | ORAL | Status: DC
  Administered 2017-02-09 – 2017-02-16 (×14): 5 mg via ORAL
  Filled 2017-02-09 (×14): qty 1

## 2017-02-09 MED ORDER — ALBUTEROL (5 MG/ML) CONTINUOUS INHALATION SOLN
10.0000 mg/h | INHALATION_SOLUTION | Freq: Once | RESPIRATORY_TRACT | Status: AC
  Administered 2017-02-09: 10 mg/h via RESPIRATORY_TRACT
  Filled 2017-02-09: qty 20

## 2017-02-09 MED ORDER — INSULIN NPH (HUMAN) (ISOPHANE) 100 UNIT/ML ~~LOC~~ SUSP
12.0000 [IU] | Freq: Every day | SUBCUTANEOUS | Status: DC
  Administered 2017-02-09 – 2017-02-11 (×3): 12 [IU] via SUBCUTANEOUS

## 2017-02-09 MED ORDER — ISOSORBIDE MONONITRATE ER 30 MG PO TB24
15.0000 mg | ORAL_TABLET | Freq: Every day | ORAL | Status: DC
  Administered 2017-02-09 – 2017-02-16 (×8): 15 mg via ORAL
  Filled 2017-02-09 (×8): qty 1

## 2017-02-09 MED ORDER — IPRATROPIUM-ALBUTEROL 0.5-2.5 (3) MG/3ML IN SOLN
3.0000 mL | Freq: Once | RESPIRATORY_TRACT | Status: AC
  Administered 2017-02-09: 3 mL via RESPIRATORY_TRACT
  Filled 2017-02-09: qty 3

## 2017-02-09 MED ORDER — LEVOTHYROXINE SODIUM 75 MCG PO TABS
150.0000 ug | ORAL_TABLET | Freq: Every day | ORAL | Status: DC
  Administered 2017-02-10 – 2017-02-16 (×7): 150 ug via ORAL
  Filled 2017-02-09 (×7): qty 2

## 2017-02-09 MED ORDER — ONDANSETRON HCL 4 MG PO TABS: 4.0000 mg | ORAL_TABLET | Freq: Four times a day (QID) | ORAL | Status: DC | PRN

## 2017-02-09 MED ORDER — BUSPIRONE HCL 10 MG PO TABS
5.0000 mg | ORAL_TABLET | Freq: Every day | ORAL | Status: DC
  Administered 2017-02-09 – 2017-02-12 (×4): 5 mg via ORAL
  Filled 2017-02-09 (×6): qty 1

## 2017-02-09 MED ORDER — FUROSEMIDE 10 MG/ML IJ SOLN
40.0000 mg | Freq: Two times a day (BID) | INTRAMUSCULAR | Status: DC
  Administered 2017-02-10 – 2017-02-12 (×5): 40 mg via INTRAVENOUS
  Filled 2017-02-09 (×5): qty 4

## 2017-02-09 MED ORDER — LISINOPRIL 2.5 MG PO TABS
2.5000 mg | ORAL_TABLET | Freq: Every day | ORAL | Status: DC
  Administered 2017-02-10 – 2017-02-16 (×7): 2.5 mg via ORAL
  Filled 2017-02-09 (×7): qty 1

## 2017-02-09 MED ORDER — LEVALBUTEROL HCL 0.63 MG/3ML IN NEBU
0.6300 mg | INHALATION_SOLUTION | Freq: Four times a day (QID) | RESPIRATORY_TRACT | Status: DC | PRN
  Filled 2017-02-09: qty 3

## 2017-02-09 MED ORDER — INSULIN NPH (HUMAN) (ISOPHANE) 100 UNIT/ML ~~LOC~~ SUSP
25.0000 [IU] | Freq: Every day | SUBCUTANEOUS | Status: DC
  Administered 2017-02-10 – 2017-02-13 (×4): 25 [IU] via SUBCUTANEOUS
  Filled 2017-02-09: qty 10

## 2017-02-09 MED ORDER — DILTIAZEM HCL ER COATED BEADS 120 MG PO CP24
120.0000 mg | ORAL_CAPSULE | Freq: Every day | ORAL | Status: DC
  Administered 2017-02-09 – 2017-02-12 (×4): 120 mg via ORAL
  Filled 2017-02-09 (×4): qty 1

## 2017-02-09 NOTE — ED Notes (Signed)
CBG 134 

## 2017-02-09 NOTE — Progress Notes (Signed)
Came to room to start CAT.  RN came to bring snack to pt d/t pt w/ low CBG.  RN states she will start CAT in 10-15 minutes.

## 2017-02-09 NOTE — ED Notes (Signed)
CBG 63. Patient provided with graham crackers and coke. EDP aware.

## 2017-02-09 NOTE — ED Notes (Signed)
Per Palliative Care NP: pt was sent to ED today by her healthcare providers with concern for uncontrolled diabetes, worsening bilateral lower extremity edema and shortness of breath, and possibly being unsafe to live at home alone. The family has been wanting the patient to go to an assisted living due to safety issues but the patient has refused

## 2017-02-09 NOTE — H&P (Signed)
History and Physical    Tara Flores ZOX:[id number] DOB: [birth year] DOA: 02/09/2017  PCP: Annita Brod, MD   Patient coming from: Home.  I have personally briefly reviewed patient's old medical records in [facility] Link  Chief Complaint: Leg swelling and redness.  HPI: Tara Flores is a 81 y.o. female with medical history significant of anxiety, osteoarthritis, asthma, CAD, COPD, depression, type 2 diabetes, hyperlipidemia, hypertension, hypothyroidism, active smoker coming to the emergency department by her PCP due to uncontrolled diabetes, worsening lower extremity edema and increased shortness of breath. She complains of lower extremity edema, occasional orthopnea, wheezing and dry cough. denies fever, chills, headache, sore throat, chest pain, palpitations, dizziness, diaphoresis, PND, abdominal pain, nausea, emesis, diarrhea, constipation, melena, hematochezia, dysuria, frequency, polyuria, polydipsia or blurred vision.  ED Course: Initial vital signs in emergency department temperature 97.4, pulse 75, blood pressure 141/104, respirations 16 and O2 sat 99%. The patient was given a total 12.5 mg of albuterol and developed RVR. She also received furosemide 40 mg IVP and was encouraged to eat due to hypoglycemia. Her CBC was unremarkable. Chemistries showed normal troponin, BNP 471.3 pg/mL. Sodium 140, potassium 3.4, chloride 105, bicarbonate 28 lactic acid 1.11 mmol/L. BUN 19, creatinine 0.61 and glucose is 50 mg/dL. Albumin was 2.9 g/dL and alkaline phosphatase 145. The rest of the LFTs were within normal limits. Chest radiograph is consistent with CHF. Please see images sent for radiology report for further details  Review of Systems: As per HPI otherwise 10 point review of systems negative.    Past Medical History:  Diagnosis Date  . Anxiety   . Arthritis   . Asthma   . CAD (coronary artery disease)   . Cardiac disease   . COPD (chronic obstructive pulmonary disease) (HCC)   .  Depression   . Diabetes mellitus without complication (HCC)   . Hyperlipidemia   . Hypertension   . Hypothyroidism     Past Surgical History:  Procedure Laterality Date  . AMPUTATION Left 10/12/2016   Procedure: TRANSMETATARSAL AMPUTATION LEFT FOOT WITH ACHILLES TENDON LENGTHENING;  Surgeon: Felecia Shelling, DPM;  Location: [facility] OR;  Service: Podiatry;  Laterality: Left;  . APPENDECTOMY    . CARDIAC CATHETERIZATION N/A 02/05/2015   Procedure: Left Heart Cath and Coronary Angiography;  Surgeon: Kathleene Hazel, MD;  Location: [facility] INVASIVE CV LAB;  Service: Cardiovascular;  Laterality: N/A;  . EYE SURGERY  2006   unsure if exact procedure  . HIP FRACTURE SURGERY    . INTRAMEDULLARY (IM) NAIL INTERTROCHANTERIC Right 02/06/2015   Procedure: INTRAMEDULLARY (IM) NAIL RIGHT HIP;  Surgeon: Tarry Kos, MD;  Location: [facility] OR;  Service: Orthopedics;  Laterality: Right;  . JOINT REPLACEMENT     due to hip fracture     reports that she has been smoking Cigarettes.  She has a 31.50 pack-year smoking history. She has never used smokeless tobacco. She reports that she does not drink alcohol or use drugs.  Allergies  Allergen Reactions  . Brethine [Terbutaline]     Made patient confused  . Keflex [Cephalexin] Nausea Only    Loss of appetite    Family History  Problem Relation Age of Onset  . Alcohol abuse Father   . Cancer Mother        lung  . Heart disease Mother   . Diabetes Sister   . Heart disease Sister     Prior to Admission medications   Medication Sig Start Date End Date Taking?  Authorizing Provider  acetaminophen (TYLENOL) 325 MG tablet Take 2 tablets (650 mg total) by mouth every 6 (six) hours as needed for mild pain (or Fever >/= 101). 10/13/16  Yes Leroy Sea, MD  albuterol (PROVENTIL HFA;VENTOLIN HFA) 108 (90 Base) MCG/ACT inhaler Inhale 1 puff into the lungs every 6 (six) hours as needed for wheezing or shortness of breath. Reported on 09/06/2015 09/25/15  Yes  Elvina Sidle, MD  albuterol (PROVENTIL) (2.5 MG/3ML) 0.083% nebulizer solution USE 1 VIAL VIA NEBULIZER  EVERY 6 HOURS AS NEEDED FOR WHEEZING OR SHORTNESS OF  BREATH. 11/02/16  Yes Sharon Seller, NP  apixaban (ELIQUIS) 5 MG TABS tablet Take 1 tablet (5 mg total) by mouth 2 (two) times daily. 11/23/16  Yes Marquette Saa, MD  busPIRone (BUSPAR) 5 MG tablet Take 5 mg by mouth at bedtime. 02/01/17  Yes [provider]  diltiazem (CARDIZEM CD) 120 MG 24 hr capsule Take 1 capsule (120 mg total) by mouth daily at 6 PM. 11/23/16  Yes Marquette Saa, MD  insulin NPH Human (HUMULIN N,NOVOLIN N) 100 UNIT/ML injection Inject 12-25 Units into the skin 2 (two) times daily. 25 units in the morning and 12 units bedtime   Yes [provider]  levothyroxine (SYNTHROID, LEVOTHROID) 150 MCG tablet Take 150 mcg by mouth daily before breakfast.   Yes [provider]  nicotine (NICODERM CQ - DOSED IN MG/24 HOURS) 14 mg/24hr patch Place 14 mg onto the skin daily.   Yes [provider]  nitroGLYCERIN (NITROSTAT) 0.4 MG SL tablet Place 1 tablet (0.4 mg total) under the tongue every 5 (five) minutes as needed for chest pain. 07/18/15  Yes Wendall Stade, MD  insulin aspart (NOVOLOG) 100 UNIT/ML injection Before each meal 3 times a day, 140-199 - 2 units, 200-250 - 4 units, 251-299 - 6 units,  300-349 - 8 units,  350 or above 10 units. Insulin PEN if approved, provide syringes and needles if needed. Patient not taking: Reported on 11/19/2016 10/13/16   Leroy Sea, MD  insulin glargine (LANTUS) 100 UNIT/ML injection Inject 0.15 mLs (15 Units total) into the skin 2 (two) times daily. Patient not taking: Reported on 11/19/2016 10/13/16   Leroy Sea, MD  oxyCODONE-acetaminophen (PERCOCET/ROXICET) 5-325 MG tablet Take 1 tablet by mouth every 6 (six) hours as needed for severe pain. Patient not taking: Reported on 02/09/2017 10/13/16   Leroy Sea, MD    sulfamethoxazole-trimethoprim (BACTRIM DS,SEPTRA DS) 800-160 MG tablet Take 1 tablet by mouth 2 (two) times daily. For 26 days 11/07/16   [provider]    Physical Exam: Vitals:   02/09/17 1302 02/09/17 1627 02/09/17 1745 02/09/17 1900  BP: (!) 141/104 (!) 139/95 (!) 169/96 (!) 150/82  Pulse: 75 (!) 108 (!) 102 (!) 112  Resp: 16 17    Temp: (!) 97.4 F (36.3 C)     TempSrc: Oral     SpO2: 99% 100% 96% 98%    Constitutional: Elderly, frail, but in NAD, calm, comfortable Eyes: PERRL, lids and conjunctivae normal ENMT: Mucous membranes are moist. Posterior pharynx clear of any exudate or lesions. Neck: normal, supple, no masses, no thyromegaly Respiratory: Decreased breath sounds on bases with mild wheezing bilaterally. Normal respiratory effort. No accessory muscle use.  Cardiovascular: Regular rate and rhythm, no murmurs / rubs / gallops. 2+ lower extremities edema. 2+ pedal pulses. No carotid bruits.  Abdomen: Soft, no tenderness, no masses palpated. No hepatosplenomegaly. Bowel sounds positive.  Musculoskeletal: no clubbing / cyanosis. Left foot amputation with dressing. No contractures. Normal muscle tone.  Skin: Positive lower extremity erythema. Neurologic: CN 2-12 grossly intact. Sensation intact, DTR normal. Strength 5/5 in all 4.  Psychiatric: Normal judgment and insight. Alert and oriented x 4. Normal mood.   Labs on Admission: I have personally reviewed following labs and imaging studies  CBC:  Recent Labs Lab 02/09/17 1307  WBC 5.8  NEUTROABS 3.7  HGB 12.4  HCT 38.5  MCV 92.3  PLT 303   Basic Metabolic Panel:  Recent Labs Lab 02/09/17 1307  NA 140  K 3.4*  CL 105  CO2 28  GLUCOSE 50*  BUN 19  CREATININE 0.61  CALCIUM 8.5*   GFR: CrCl cannot be calculated (Unknown ideal weight.). Liver Function Tests:  Recent Labs Lab 02/09/17 1307  AST 19  ALT 17  ALKPHOS 145*  BILITOT 0.5  PROT 7.1  ALBUMIN 2.9*   No results for input(s):  LIPASE, AMYLASE in the last 168 hours. No results for input(s): AMMONIA in the last 168 hours. Coagulation Profile: No results for input(s): INR, PROTIME in the last 168 hours. Cardiac Enzymes: No results for input(s): CKTOTAL, CKMB, CKMBINDEX, TROPONINI in the last 168 hours. BNP (last 3 results) No results for input(s): PROBNP in the last 8760 hours. HbA1C: No results for input(s): HGBA1C in the last 72 hours. CBG: No results for input(s): GLUCAP in the last 168 hours. Lipid Profile: No results for input(s): CHOL, HDL, LDLCALC, TRIG, CHOLHDL, LDLDIRECT in the last 72 hours. Thyroid Function Tests: No results for input(s): TSH, T4TOTAL, FREET4, T3FREE, THYROIDAB in the last 72 hours. Anemia Panel: No results for input(s): VITAMINB12, FOLATE, FERRITIN, TIBC, IRON, RETICCTPCT in the last 72 hours. Urine analysis:    Component Value Date/Time   COLORURINE AMBER (A) 11/19/2016 1427   APPEARANCEUR CLEAR 11/19/2016 1427   LABSPEC 1.024 11/19/2016 1427   PHURINE 5.0 11/19/2016 1427   GLUCOSEU 150 (A) 11/19/2016 1427   HGBUR NEGATIVE 11/19/2016 1427   BILIRUBINUR NEGATIVE 11/19/2016 1427   KETONESUR NEGATIVE 11/19/2016 1427   PROTEINUR 100 (A) 11/19/2016 1427   UROBILINOGEN 0.2 02/05/2015 0528   NITRITE NEGATIVE 11/19/2016 1427   LEUKOCYTESUR NEGATIVE 11/19/2016 1427    Radiological Exams on Admission: Dg Chest 2 View  Result Date: 02/09/2017 CLINICAL DATA:  Lower extremity edema EXAM: CHEST  2 VIEW COMPARISON:  Nov 19, 2016 FINDINGS: There are small pleural effusions bilaterally, slightly larger on the right than on the left. There is slight bibasilar atelectasis. There is trace interstitial edema. There is no appreciable airspace consolidation. Heart is borderline enlarged with pulmonary venous hypertension. No adenopathy. There are foci of coronary artery calcification as well as aortic atherosclerosis. There also foci of calcification in the right brachial and axillary arteries and  calcification in each carotid artery. No evident bone lesions. IMPRESSION: Findings felt to be indicative of a degree of congestive heart failure. No airspace consolidation. Aortic atherosclerosis. There are foci of calcification in each carotid artery as well as in the right axillary and brachial arteries. Foci of coronary artery calcification also noted. Aortic Atherosclerosis (ICD10-I70.0). Electronically Signed   By: Bretta Bang III M.D.   On: 02/09/2017 14:58  Echo 11/21/2016  ------------------------------------------------------------------- LV EF: 60% -   65%  ------------------------------------------------------------------- Indications:      Atrial fibrillation - 427.31.  ------------------------------------------------------------------- History:   PMH:   Coronary artery disease.  Chronic obstructive pulmonary disease.  Risk factors:  Hypertension. Diabetes mellitus.   -------------------------------------------------------------------  Study Conclusions  - Left ventricle: The cavity size was normal. Wall thickness was   increased in a pattern of mild LVH. Systolic function was normal.   The estimated ejection fraction was in the range of 60% to 65%.   Wall motion was normal; there were no regional wall motion   abnormalities. Doppler parameters are consistent with a   reversible restrictive pattern, indicative of decreased left   ventricular diastolic compliance and/or increased left atrial   pressure (grade 3 diastolic dysfunction). - Aortic valve: Trileaflet; mildly thickened, moderately calcified   leaflets. There was mild regurgitation. - Mitral valve: Moderately calcified annulus. There was mild to   moderate regurgitation. - Left atrium: The atrium was mildly dilated. - Right ventricle: The cavity size was mildly dilated. Wall   thickness was normal. - Right atrium: The atrium was moderately dilated. - Pulmonary arteries: Systolic pressure was moderately  increased.   PA peak pressure: 47 mm Hg (S).  Impressions:  - No cardiac source of emboli was indentified.  EKG: Independently reviewed Vent. rate 116 BPM PR interval * ms QRS duration 78 ms QT/QTc 344/478 ms P-R-T axes * -31 105 Atrial fibrillation with rapid ventricular response Left axis deviation Septal infarct , age undetermined Abnormal ECG  Assessment/Plan Principal Problem:   Acute on chronic diastolic (congestive) heart failure (HCC) Observation/telemetry. Continue supplemental oxygen. Monitor intake and output. Daily weights. Furosemide 40 mg IVP twice a day. Monitor renal function and electrolytes. Start low-dose ACE inhibitor  Active Problems:   Atrial fibrillation with RVR (HCC) Continue Cardizem 120 mg by mouth daily. Switch albuterol to Xopenex. Optimize electrolytes. Metoprolol 2.5 mg IVP every 4 hours when necessary.    COPD exacerbation (HCC) Continue supplemental oxygen and bronchodilators    Essential hypertension Continue Cardizem. On furosemide 40 mg IVP twice a day. Start lisinopril 2.5 mg by mouth daily. Monitor blood pressure, renal function and electrolytes.    Hypothyroidism Check TSH. Continue levothyroxine 150 g by mouth daily.    Hypokalemia Replaced. Also supplement the magnesium. Follow-up potassium level.      Type 2 diabetes mellitus with hypoglycemia (HCC) Carbohydrate modified diet. Continue NPH insulin 25 units in the morning. Continue NPH insulin 12 units in the evening    Tobacco use disorder Ordered nicotine replacement therapy.   DVT prophylaxis: on Eliquis. Code Status: Full code. Family Communication:  Disposition Plan: Admit for diuresis and heart rate control. Consults called:  Admission status: Observation/Telemetry.   Bobette Mo MD [facility] Pager [phone].  If 7PM-7AM, please contact night-coverage [email or web address] Password [facility]  02/09/2017, 7:37 PM

## 2017-02-09 NOTE — ED Notes (Signed)
Pt's CBG result was 60. Informed Jessica - RN. Pt given graham crackers and peanut butter, per Morrie Sheldon - RN.

## 2017-02-09 NOTE — ED Notes (Signed)
CBG ordered due glucose 50 on CMET result at 1346. The patient states she has been eating gummies because she felt her blood sugar was low. Graham crackers and peanut butter snack given

## 2017-02-09 NOTE — ED Triage Notes (Signed)
To triage via EMS.  Pt sent to ED per PCP, swelling, redness in bilateral legs.

## 2017-02-09 NOTE — ED Provider Notes (Signed)
[facility]-EMERGENCY DEPT Provider Note   CSN: [id number] Arrival date & time: 02/09/17  1252     History   Chief Complaint Chief Complaint  Patient presents with  . Cellulitis    HPI CHANTEE CERINO is a 81 y.o. female with a history of DM 2, hypertension, cellulitis and diabetic foot status post lisfranc amputation of left foot in 4/18, TIA, COPD, A. Fib who was sent to the emergency room by her health care providers today for concern for uncontrolled diabetes, worsening bilateral lower extremity edema, increased shortness of breath. Family reportedly wants the patient to go to assisted living due to safety issues.  Patient is followed by palliative care for gradual worsening of multiple chronic issues.  She reports that she continues to smoke 5 cigarettes daily.  She does not want to quit at this point.  She denies fevers or chills.  No N/V/D or headache.  She reports that she is unable to lay flat to sleep because she is then unable to breathe.   HPI  Past Medical History:  Diagnosis Date  . Anxiety   . Arthritis   . Asthma   . CAD (coronary artery disease)   . Cardiac disease   . COPD (chronic obstructive pulmonary disease) (HCC)   . Depression   . Diabetes mellitus without complication (HCC)   . Hyperlipidemia   . Hypertension   . Hypothyroidism     Patient Active Problem List   Diagnosis Date Noted  . New onset a-fib (HCC) 11/20/2016  . Atrial fibrillation with RVR (HCC)   . COPD exacerbation (HCC)   . Goals of care, counseling/discussion   . Palliative care by specialist   . TIA (transient ischemic attack) 11/19/2016  . Diabetic foot ulcer (HCC) 10/10/2016  . Hypokalemia 10/10/2016  . Anemia due to chronic kidney disease 10/10/2016  . Cellulitis in diabetic foot (HCC) 10/09/2016  . Venous ulcer of left leg (HCC) 07/28/2016  . Dry skin dermatitis 07/28/2016  . Type 2 diabetes mellitus with hyperglycemia (HCC) 02/06/2015  . Hip fracture (HCC) 02/05/2015  . Closed  right hip fracture (HCC) 02/05/2015  . Abnormal EKG 02/05/2015  . Tobacco abuse 02/05/2015  . DM2 (diabetes mellitus, type 2) (HCC) 02/05/2015  . Essential hypertension 02/05/2015  . Hypothyroidism 02/05/2015  . Coronary artery disease involving native coronary artery of native heart without angina pectoris     Past Surgical History:  Procedure Laterality Date  . AMPUTATION Left 10/12/2016   Procedure: TRANSMETATARSAL AMPUTATION LEFT FOOT WITH ACHILLES TENDON LENGTHENING;  Surgeon: Felecia Shelling, DPM;  Location: [facility] OR;  Service: Podiatry;  Laterality: Left;  . APPENDECTOMY    . CARDIAC CATHETERIZATION N/A 02/05/2015   Procedure: Left Heart Cath and Coronary Angiography;  Surgeon: Kathleene Hazel, MD;  Location: [facility] INVASIVE CV LAB;  Service: Cardiovascular;  Laterality: N/A;  . EYE SURGERY  2006   unsure if exact procedure  . HIP FRACTURE SURGERY    . INTRAMEDULLARY (IM) NAIL INTERTROCHANTERIC Right 02/06/2015   Procedure: INTRAMEDULLARY (IM) NAIL RIGHT HIP;  Surgeon: Tarry Kos, MD;  Location: [facility] OR;  Service: Orthopedics;  Laterality: Right;  . JOINT REPLACEMENT     due to hip fracture    OB History    No data available       Home Medications    Prior to Admission medications   Medication Sig Start Date End Date Taking? Authorizing Provider  acetaminophen (TYLENOL) 325 MG tablet Take 2 tablets (650 mg total)  by mouth every 6 (six) hours as needed for mild pain (or Fever >/= 101). 10/13/16  Yes Leroy Sea, MD  albuterol (PROVENTIL HFA;VENTOLIN HFA) 108 (90 Base) MCG/ACT inhaler Inhale 1 puff into the lungs every 6 (six) hours as needed for wheezing or shortness of breath. Reported on 09/06/2015 09/25/15  Yes Elvina Sidle, MD  albuterol (PROVENTIL) (2.5 MG/3ML) 0.083% nebulizer solution USE 1 VIAL VIA NEBULIZER  EVERY 6 HOURS AS NEEDED FOR WHEEZING OR SHORTNESS OF  BREATH. 11/02/16  Yes Sharon Seller, NP  apixaban (ELIQUIS) 5 MG TABS tablet Take 1 tablet (5 mg  total) by mouth 2 (two) times daily. 11/23/16  Yes Marquette Saa, MD  busPIRone (BUSPAR) 5 MG tablet Take 5 mg by mouth at bedtime. 02/01/17  Yes [provider]  diltiazem (CARDIZEM CD) 120 MG 24 hr capsule Take 1 capsule (120 mg total) by mouth daily at 6 PM. 11/23/16  Yes Marquette Saa, MD  insulin NPH Human (HUMULIN N,NOVOLIN N) 100 UNIT/ML injection Inject 12-25 Units into the skin 2 (two) times daily. 25 units in the morning and 12 units bedtime   Yes [provider]  levothyroxine (SYNTHROID, LEVOTHROID) 150 MCG tablet Take 150 mcg by mouth daily before breakfast.   Yes [provider]  nicotine (NICODERM CQ - DOSED IN MG/24 HOURS) 14 mg/24hr patch Place 14 mg onto the skin daily.   Yes [provider]  nitroGLYCERIN (NITROSTAT) 0.4 MG SL tablet Place 1 tablet (0.4 mg total) under the tongue every 5 (five) minutes as needed for chest pain. 07/18/15  Yes Wendall Stade, MD  insulin aspart (NOVOLOG) 100 UNIT/ML injection Before each meal 3 times a day, 140-199 - 2 units, 200-250 - 4 units, 251-299 - 6 units,  300-349 - 8 units,  350 or above 10 units. Insulin PEN if approved, provide syringes and needles if needed. Patient not taking: Reported on 11/19/2016 10/13/16   Leroy Sea, MD  insulin glargine (LANTUS) 100 UNIT/ML injection Inject 0.15 mLs (15 Units total) into the skin 2 (two) times daily. Patient not taking: Reported on 11/19/2016 10/13/16   Leroy Sea, MD  oxyCODONE-acetaminophen (PERCOCET/ROXICET) 5-325 MG tablet Take 1 tablet by mouth every 6 (six) hours as needed for severe pain. Patient not taking: Reported on 02/09/2017 10/13/16   Leroy Sea, MD  sulfamethoxazole-trimethoprim (BACTRIM DS,SEPTRA DS) 800-160 MG tablet Take 1 tablet by mouth 2 (two) times daily. For 26 days 11/07/16   [provider]    Family History Family History  Problem Relation Age of Onset  . Alcohol abuse Father   . Cancer  Mother        lung  . Heart disease Mother   . Diabetes Sister   . Heart disease Sister     Social History Social History  Substance Use Topics  . Smoking status: Current Every Day Smoker    Packs/day: 0.50    Years: 63.00    Types: Cigarettes  . Smokeless tobacco: Never Used     Comment: has recently cut back, conts to cut back but repors she has a lot of excuses   . Alcohol use No     Allergies   Brethine [terbutaline] and Keflex [cephalexin]   Review of Systems Review of Systems  Constitutional: Positive for fatigue. Negative for chills and fever.  HENT: Negative for ear pain and sore throat.   Eyes: Negative for pain and visual disturbance.  Respiratory: Positive for cough,  shortness of breath and wheezing. Negative for chest tightness.   Cardiovascular: Positive for leg swelling. Negative for chest pain and palpitations.  Gastrointestinal: Negative for abdominal pain, nausea and vomiting.  Genitourinary: Negative for dysuria and hematuria.  Musculoskeletal: Negative for arthralgias and back pain.  Skin: Negative for color change and rash.  Neurological: Negative for seizures, syncope and headaches.  All other systems reviewed and are negative.    Physical Exam Updated Vital Signs BP (!) 169/96   Pulse (!) 102   Temp (!) 97.4 F (36.3 C) (Oral)   Resp 17   SpO2 96%   Physical Exam  Constitutional: She appears well-developed and well-nourished. No distress.  HENT:  Head: Normocephalic and atraumatic.  Mouth/Throat: Oropharynx is clear and moist.  Eyes: Conjunctivae are normal. No scleral icterus.  Neck: Normal range of motion. Neck supple.  Cardiovascular: Regular rhythm, normal heart sounds and intact distal pulses.  Tachycardia present.  Exam reveals no friction rub.   No murmur heard. Bilateral lower extremities have pitting edema up to groin, +2 in feet/ankles, +1 in thigh.   Pulmonary/Chest: Effort normal. No respiratory distress. She has wheezes.  She has rales.  Abdominal: Soft. Bowel sounds are normal. She exhibits no distension. There is no tenderness. There is no guarding.  Musculoskeletal: She exhibits no edema.  Neurological: She is alert.  Skin: Skin is warm and dry.  Bilateral lower extremities are red, changes consistent with chronic stasis dermatitis.  Psychiatric: She has a normal mood and affect. Her behavior is normal.  Nursing note and vitals reviewed.    ED Treatments / Results  Labs (all labs ordered are listed, but only abnormal results are displayed) Labs Reviewed  COMPREHENSIVE METABOLIC PANEL - Abnormal; Notable for the following:       Result Value   Potassium 3.4 (*)    Glucose, Bld 50 (*)    Calcium 8.5 (*)    Albumin 2.9 (*)    Alkaline Phosphatase 145 (*)    All other components within normal limits  BRAIN NATRIURETIC PEPTIDE - Abnormal; Notable for the following:    B Natriuretic Peptide 471.3 (*)    All other components within normal limits  CBC WITH DIFFERENTIAL/PLATELET  URINALYSIS, ROUTINE W REFLEX MICROSCOPIC  MAGNESIUM  PHOSPHORUS  I-STAT CG4 LACTIC ACID, ED  I-STAT CG4 LACTIC ACID, ED  I-STAT TROPONIN, ED  CBG MONITORING, ED  CBG MONITORING, ED    EKG  EKG Interpretation None     EKG obtained.   Radiology Dg Chest 2 View  Result Date: 02/09/2017 CLINICAL DATA:  Lower extremity edema EXAM: CHEST  2 VIEW COMPARISON:  Nov 19, 2016 FINDINGS: There are small pleural effusions bilaterally, slightly larger on the right than on the left. There is slight bibasilar atelectasis. There is trace interstitial edema. There is no appreciable airspace consolidation. Heart is borderline enlarged with pulmonary venous hypertension. No adenopathy. There are foci of coronary artery calcification as well as aortic atherosclerosis. There also foci of calcification in the right brachial and axillary arteries and calcification in each carotid artery. No evident bone lesions. IMPRESSION: Findings felt to  be indicative of a degree of congestive heart failure. No airspace consolidation. Aortic atherosclerosis. There are foci of calcification in each carotid artery as well as in the right axillary and brachial arteries. Foci of coronary artery calcification also noted. Aortic Atherosclerosis (ICD10-I70.0). Electronically Signed   By: Bretta Bang III M.D.   On: 02/09/2017 14:58    Procedures Procedures (including  critical care time)  Medications Ordered in ED Medications  albuterol (PROVENTIL,VENTOLIN) solution continuous neb (not administered)  ipratropium-albuterol (DUONEB) 0.5-2.5 (3) MG/3ML nebulizer solution 3 mL (3 mLs Nebulization Given 02/09/17 1756)  furosemide (LASIX) injection 40 mg (40 mg Intravenous Given 02/09/17 1849)     Initial Impression / Assessment and Plan / ED Course  I have reviewed the triage vital signs and the nursing notes.  Pertinent labs & imaging results that were available during my care of the patient were reviewed by me and considered in my medical decision making (see chart for details).  Clinical Course as of Feb 09 1930  Tue Feb 09, 2017  1919 Spoke with Dr. Robb Matar from [facility] who will come see patient and admit.   [EH]    Clinical Course User Index [EH] Cristina Gong, PA-C   [name] presents for gradually worsening shortness of breath, pitting edema and concern from family that she is unable to live safely at home.  Clinical exam appears consistent with fluid overload lungs diminished using, and tight bilaterally. Chest x-ray obtained and reviewed without obvious focal consolidation, evidence of small bilateral pleural effusions consistent with CHF. She reported mild improvement after DuoNeb, was started on an hour-long nebulizer treatment and given 40 of Lasix for diuresis.  Suspect that her shortness of breath is a combination of acute on chronic congestive heart failure and COPD exacerbation. Questionable cellulitis to left lower extremity  as she has wound however both legs are red and warm and show signs of chronic stasis dermatitis. Will defer potential antibiotic treatment to admitting team.   The patient was seen in a shared visit with Dr.Yelverton who evaluated the patient and stated agreement with my plan.   The patient appears reasonably stabilized for admission considering the current resources, flow, and capabilities available in the ED at this time, and I doubt any other [facility] requiring further screening and/or treatment in the ED prior to admission.  Final Clinical Impressions(s) / ED Diagnoses   Final diagnoses:  Congestive heart failure, unspecified HF chronicity, unspecified heart failure type (HCC)  COPD exacerbation [facility])    New Prescriptions New Prescriptions   No medications on file     Norman Clay 02/10/17 1006    Loren Racer, MD 02/12/17 1535

## 2017-02-10 ENCOUNTER — Other Ambulatory Visit: Payer: Self-pay

## 2017-02-10 ENCOUNTER — Ambulatory Visit: Payer: Medicare Other | Admitting: Podiatry

## 2017-02-10 DIAGNOSIS — J439 Emphysema, unspecified: Secondary | ICD-10-CM

## 2017-02-10 DIAGNOSIS — F17210 Nicotine dependence, cigarettes, uncomplicated: Secondary | ICD-10-CM | POA: Diagnosis not present

## 2017-02-10 DIAGNOSIS — F17200 Nicotine dependence, unspecified, uncomplicated: Secondary | ICD-10-CM

## 2017-02-10 DIAGNOSIS — I361 Nonrheumatic tricuspid (valve) insufficiency: Secondary | ICD-10-CM | POA: Diagnosis not present

## 2017-02-10 DIAGNOSIS — E039 Hypothyroidism, unspecified: Secondary | ICD-10-CM

## 2017-02-10 DIAGNOSIS — F329 Major depressive disorder, single episode, unspecified: Secondary | ICD-10-CM | POA: Diagnosis present

## 2017-02-10 DIAGNOSIS — I10 Essential (primary) hypertension: Secondary | ICD-10-CM | POA: Diagnosis not present

## 2017-02-10 DIAGNOSIS — E785 Hyperlipidemia, unspecified: Secondary | ICD-10-CM | POA: Diagnosis not present

## 2017-02-10 DIAGNOSIS — H548 Legal blindness, as defined in USA: Secondary | ICD-10-CM | POA: Diagnosis not present

## 2017-02-10 DIAGNOSIS — J441 Chronic obstructive pulmonary disease with (acute) exacerbation: Secondary | ICD-10-CM | POA: Diagnosis not present

## 2017-02-10 DIAGNOSIS — E11649 Type 2 diabetes mellitus with hypoglycemia without coma: Secondary | ICD-10-CM | POA: Diagnosis not present

## 2017-02-10 DIAGNOSIS — Z7189 Other specified counseling: Secondary | ICD-10-CM | POA: Diagnosis not present

## 2017-02-10 DIAGNOSIS — I340 Nonrheumatic mitral (valve) insufficiency: Secondary | ICD-10-CM | POA: Diagnosis not present

## 2017-02-10 DIAGNOSIS — I2510 Atherosclerotic heart disease of native coronary artery without angina pectoris: Secondary | ICD-10-CM | POA: Diagnosis present

## 2017-02-10 DIAGNOSIS — I4891 Unspecified atrial fibrillation: Secondary | ICD-10-CM | POA: Diagnosis not present

## 2017-02-10 DIAGNOSIS — Z66 Do not resuscitate: Secondary | ICD-10-CM | POA: Diagnosis not present

## 2017-02-10 DIAGNOSIS — Z794 Long term (current) use of insulin: Secondary | ICD-10-CM | POA: Diagnosis not present

## 2017-02-10 DIAGNOSIS — Z515 Encounter for palliative care: Secondary | ICD-10-CM | POA: Diagnosis not present

## 2017-02-10 DIAGNOSIS — I5033 Acute on chronic diastolic (congestive) heart failure: Secondary | ICD-10-CM | POA: Diagnosis not present

## 2017-02-10 DIAGNOSIS — E876 Hypokalemia: Secondary | ICD-10-CM | POA: Diagnosis not present

## 2017-02-10 DIAGNOSIS — E1165 Type 2 diabetes mellitus with hyperglycemia: Secondary | ICD-10-CM | POA: Diagnosis not present

## 2017-02-10 DIAGNOSIS — I110 Hypertensive heart disease with heart failure: Secondary | ICD-10-CM | POA: Diagnosis not present

## 2017-02-10 DIAGNOSIS — Z7901 Long term (current) use of anticoagulants: Secondary | ICD-10-CM | POA: Diagnosis not present

## 2017-02-10 LAB — TSH: TSH: 5.494 u[IU]/mL — ABNORMAL HIGH (ref 0.350–4.500)

## 2017-02-10 LAB — MAGNESIUM: MAGNESIUM: 2 mg/dL (ref 1.7–2.4)

## 2017-02-10 LAB — GLUCOSE, CAPILLARY
GLUCOSE-CAPILLARY: 63 mg/dL — AB (ref 65–99)
GLUCOSE-CAPILLARY: 216 mg/dL — AB (ref 65–99)
Glucose-Capillary: 60 mg/dL — ABNORMAL LOW (ref 65–99)
Glucose-Capillary: 134 mg/dL — ABNORMAL HIGH (ref 65–99)
Glucose-Capillary: 144 mg/dL — ABNORMAL HIGH (ref 65–99)
Glucose-Capillary: 118 mg/dL — ABNORMAL HIGH (ref 65–99)
Glucose-Capillary: 153 mg/dL — ABNORMAL HIGH (ref 65–99)
Glucose-Capillary: 200 mg/dL — ABNORMAL HIGH (ref 65–99)

## 2017-02-10 LAB — BASIC METABOLIC PANEL
ANION GAP: 8 (ref 5–15)
BUN: 15 mg/dL (ref 6–20)
CALCIUM: 8.3 mg/dL — AB (ref 8.9–10.3)
CO2: 28 mmol/L (ref 22–32)
Chloride: 102 mmol/L (ref 101–111)
Creatinine, Ser: 0.54 mg/dL (ref 0.44–1.00)
Glucose, Bld: 166 mg/dL — ABNORMAL HIGH (ref 65–99)
Potassium: 3.8 mmol/L (ref 3.5–5.1)
Sodium: 138 mmol/L (ref 135–145)

## 2017-02-10 LAB — TROPONIN I: Troponin I: <0.03 ng/mL (ref <0.03)

## 2017-02-10 MED ORDER — METOPROLOL TARTRATE 5 MG/5ML IV SOLN
5.0000 mg | Freq: Once | INTRAVENOUS | Status: AC
  Administered 2017-02-10: 5 mg via INTRAVENOUS
  Filled 2017-02-10: qty 5

## 2017-02-10 MED ORDER — HYDROXYZINE HCL 10 MG PO TABS: 10.0000 mg | ORAL_TABLET | Freq: Three times a day (TID) | ORAL | Status: DC | PRN

## 2017-02-10 MED ORDER — NICOTINE 14 MG/24HR TD PT24
14.0000 mg | MEDICATED_PATCH | Freq: Every day | TRANSDERMAL | Status: DC
  Administered 2017-02-10 – 2017-02-16 (×7): 14 mg via TRANSDERMAL
  Filled 2017-02-10 (×7): qty 1

## 2017-02-10 MED ORDER — GLUCERNA SHAKE PO LIQD
237.0000 mL | Freq: Three times a day (TID) | ORAL | Status: DC
  Administered 2017-02-10 – 2017-02-16 (×14): 237 mL via ORAL
  Filled 2017-02-10 (×6): qty 237

## 2017-02-10 MED ORDER — METOPROLOL TARTRATE 5 MG/5ML IV SOLN
2.5000 mg | INTRAVENOUS | Status: DC | PRN
  Administered 2017-02-12 – 2017-02-14 (×2): 2.5 mg via INTRAVENOUS
  Filled 2017-02-10 (×2): qty 5

## 2017-02-10 MED ORDER — NITROFURANTOIN MACROCRYSTAL 100 MG PO CAPS
100.0000 mg | ORAL_CAPSULE | Freq: Two times a day (BID) | ORAL | Status: AC
  Administered 2017-02-10 – 2017-02-14 (×10): 100 mg via ORAL
  Filled 2017-02-10 (×10): qty 1

## 2017-02-10 NOTE — Clinical Social Work Note (Signed)
CSW acknowledges consult for ALF/SNF placement. CSW paged MD regarding entering PT order. Will follow for recommendations.  Charlynn Court, CSW [phone]

## 2017-02-10 NOTE — Progress Notes (Signed)
PROGRESS NOTE    Tara Flores  RUE:[id number] DOB: 1935/10/20 DOA: 02/09/2017 PCP: Annita Brod, MD    Brief Narrative:  Patient is a 81 yo with history of anxiety, osteoarthritis, asthma, COPD witbuse, depression, type 2 diabetes, hypertension, hyperlipemia, hypothyroidism presented to the ED with acute CHF exacerbation.   Assessment & Plan:   Principal Problem:   Acute on chronic diastolic (congestive) heart failure (HCC) Active Problems:   Essential hypertension   Hypothyroidism   Coronary artery disease involving native coronary artery of native heart without angina pectoris   Hypokalemia   Atrial fibrillation with RVR (HCC)   COPD exacerbation (HCC)   Type 2 diabetes mellitus with hypoglycemia (HCC)   Tobacco use disorder   #1 acute on chronic diastolic heart failure Questionable etiology.patient with clinical improvement. Cardiac enzymes negative 2.Patient with urine output close to 2 L.2-D echo pending. Continue IV Lasix.continue Cardizem,Imdur, lisinopril.If 2-D echo is abnormal we'll consult with cardiology. Follow.  #2 A. Fib with RVR Currently rate controlled on Cardizem. Albuterol has been changed to Xopenex due to tachycardia. Eliquis 5 anticoagulation.  #3 hypokalemia Secondary to diureses. Repleted.  #4 hypothyroidism TSH slightly elevated at 5.494.continue current dose of Synthroid. Outpatient follow-up. Patient will need repeat thyroid function studies done in about 4-6 weeks.  #5 hypertension Stable. Continue lisinopril, Imdur, Cardizem, Lasix.  #6 type 2 diabetes mellitus Hemoglobin A1c was 9.0 11/19/2016.CBGs have ranged from 118-216.continue NPH.place on sliding scale insulin. Follow.  #7 tobacco use disorder Tobacco cessation. Continue nicotine patch.  #8 COPD Continue scheduled nebs.    DVT prophylaxis: eliquis Code Status: DO NOT RESUSCITATE Family Communication: updated patient. No family at bedside. Disposition Plan: likely home  versus skilled nursing facility pending PT evaluation.   Consultants:   Palliative care pending  Procedures:   CXR 02/09/2017    Antimicrobials:   Macrobid 02/10/2017>>>>02/15/2017   Subjective: Patient states shortness of breath has improved since admission. No chest pain. Complaining of itching.  Objective: Vitals:   02/10/17 0515 02/10/17 0618 02/10/17 0717 02/10/17 1146  BP:  113/75  114/85  Pulse:  (!) 108  (!) 105  Resp:  18  20  Temp:  98 F (36.7 C)  97.8 F (36.6 C)  TempSrc:  Oral  Oral  SpO2:  95% 96% 96%  Weight: 75.9 kg (167 lb 6.4 oz)     Height:        Intake/Output Summary (Last 24 hours) at 02/10/17 1205 Last data filed at 02/10/17 1040  Gross per 24 hour  Intake               50 ml  Output             1250 ml  Net            -1200 ml   Filed Weights   02/09/17 2100 02/10/17 0515  Weight: 76 kg (167 lb 8 oz) 75.9 kg (167 lb 6.4 oz)    Examination:  General exam: Appears calm and comfortable  Respiratory system: Scattered crackles. Minimal wheezing.  Cardiovascular system: S1 & S2 heard, RRR. No JVD, murmurs, rubs, gallops or clicks. 1-2 + BLE edema.  Gastrointestinal system: Abdomen is nondistended, soft and nontender. No organomegaly or masses felt. Normal bowel sounds heard. Central nervous system: Alert and oriented. No focal neurological deficits. Extremities: Symmetric 5 x 5 power. Skin: Bilateral lower extremity chronic venous stasis changes. No rashes, lesions or ulcers Psychiatry: Judgement and insight appear normal. Mood & affect  appropriate.     Data Reviewed: I have personally reviewed following labs and imaging studies  CBC:  Recent Labs Lab 02/09/17 1307  WBC 5.8  NEUTROABS 3.7  HGB 12.4  HCT 38.5  MCV 92.3  PLT 303   Basic Metabolic Panel:  Recent Labs Lab 02/09/17 1307 02/09/17 2342 02/10/17 0558  NA 140  --  138  K 3.4*  --  3.8  CL 105  --  102  CO2 28  --  28  GLUCOSE 50*  --  166*  BUN 19  --  15    CREATININE 0.61  --  0.54  CALCIUM 8.5*  --  8.3*  MG 1.9 2.0  --   PHOS 3.8  --   --    GFR: Estimated Creatinine Clearance: 53.1 mL/min (by C-G formula based on SCr of 0.54 mg/dL). Liver Function Tests:  Recent Labs Lab 02/09/17 1307  AST 19  ALT 17  ALKPHOS 145*  BILITOT 0.5  PROT 7.1  ALBUMIN 2.9*   No results for input(s): LIPASE, AMYLASE in the last 168 hours. No results for input(s): AMMONIA in the last 168 hours. Coagulation Profile: No results for input(s): INR, PROTIME in the last 168 hours. Cardiac Enzymes:  Recent Labs Lab 02/09/17 2342 02/10/17 0558  TROPONINI <0.03 <0.03   BNP (last 3 results) No results for input(s): PROBNP in the last 8760 hours. HbA1C: No results for input(s): HGBA1C in the last 72 hours. CBG:  Recent Labs Lab 02/09/17 2024 02/09/17 2125 02/10/17 0648 02/10/17 0726 02/10/17 1120  GLUCAP 134* 168* 144* 216* 118*   Lipid Profile: No results for input(s): CHOL, HDL, LDLCALC, TRIG, CHOLHDL, LDLDIRECT in the last 72 hours. Thyroid Function Tests:  Recent Labs  02/10/17 0643  TSH 5.494*   Anemia Panel: No results for input(s): VITAMINB12, FOLATE, FERRITIN, TIBC, IRON, RETICCTPCT in the last 72 hours. Sepsis Labs:  Recent Labs Lab 02/09/17 1318  LATICACIDVEN 1.11    No results found for this or any previous visit (from the past 240 hour(s)).       Radiology Studies: Dg Chest 2 View  Result Date: 02/09/2017 CLINICAL DATA:  Lower extremity edema EXAM: CHEST  2 VIEW COMPARISON:  Nov 19, 2016 FINDINGS: There are small pleural effusions bilaterally, slightly larger on the right than on the left. There is slight bibasilar atelectasis. There is trace interstitial edema. There is no appreciable airspace consolidation. Heart is borderline enlarged with pulmonary venous hypertension. No adenopathy. There are foci of coronary artery calcification as well as aortic atherosclerosis. There also foci of calcification in the right  brachial and axillary arteries and calcification in each carotid artery. No evident bone lesions. IMPRESSION: Findings felt to be indicative of a degree of congestive heart failure. No airspace consolidation. Aortic atherosclerosis. There are foci of calcification in each carotid artery as well as in the right axillary and brachial arteries. Foci of coronary artery calcification also noted. Aortic Atherosclerosis (ICD10-I70.0). Electronically Signed   By: Bretta Bang III M.D.   On: 02/09/2017 14:58        Scheduled Meds: . apixaban  5 mg Oral BID  . busPIRone  5 mg Oral QHS  . diltiazem  120 mg Oral q1800  . furosemide  40 mg Intravenous BID  . insulin NPH Human  12 Units Subcutaneous QHS  . insulin NPH Human  25 Units Subcutaneous QAC breakfast  . ipratropium  0.5 mg Nebulization Q6H  . isosorbide mononitrate  15 mg Oral  Daily  . levalbuterol  0.63 mg Nebulization Q6H  . levothyroxine  150 mcg Oral QAC breakfast  . lisinopril  2.5 mg Oral Daily  . nicotine  14 mg Transdermal Daily  . nitrofurantoin  100 mg Oral BID   Continuous Infusions:   LOS: 0 days    Time spent: 35 mins    Maridee Slape, MD [facility] Pager [phone]  If 7PM-7AM, please contact night-coverage [email or web address] Password [facility] 02/10/2017, 12:05 PM

## 2017-02-10 NOTE — Progress Notes (Signed)
Pt's HR in 120's-140's sustaining in Afib, asymptomatic. Dr. Robb Matar was notified and ordered one time dose Metoprolol 5 mg IV. Will continue to monitor pt.

## 2017-02-10 NOTE — Care Management Note (Signed)
Case Management Note  Patient Details  Name: MICHALIA TROXLER MRN: [id number] Date of Birth: 1935-10-21  Subjective/Objective:       CHF            Action/Plan: Patient lives at home; PCP: Annita Brod, MD; has private insurance with [facility] with prescription drug coverage; awaiting for PT eval for disposition needs home vs short term SNF  Expected Discharge Date:      Possibly 02/14/2017            Expected Discharge Plan:  Home w Home Health Services  Discharge planning Services  CM Consult  Status of Service:  In process, will continue to follow  Reola Mosher [phone] 02/10/2017, 11:17 AM

## 2017-02-10 NOTE — Progress Notes (Signed)
Initial Nutrition Assessment  DOCUMENTATION CODES:   Not applicable  INTERVENTION:    Glucerna Shake po TID, each supplement provides 220 kcal and 10 grams of protein  NUTRITION DIAGNOSIS:   Increased nutrient needs related to chronic illness as evidenced by estimated needs  GOAL:   Patient will meet greater than or equal to 90% of their needs  MONITOR:   PO intake, Supplement acceptance, Labs, Weight trends, Skin, I & O's  REASON FOR ASSESSMENT:   Consult Assessment of nutrition requirement/status  ASSESSMENT:   81 y.o. Female with medical history significant of anxiety, osteoarthritis, asthma, CAD, COPD, depression, type 2 diabetes, hyperlipidemia, hypertension, hypothyroidism, active smoker coming to the emergency department by her PCP due to uncontrolled diabetes, worsening lower extremity edema and increased shortness of breath.  Pt reports a good appetite. Typically consumes 3 meals per day. Reveals she's still hungry after lunch. Nurse Tech bringing PB & graham crackers. Likes Glucerna Shake supplements. Would like during acute hospitalization.  No recent unintentional weight loss. States she is retaining fluid at this time. Labs and medications reviewed. CBG's [id number].  Nutrition focused physical exam completed.   No muscle or subcutaneous fat depletion noticed. Mild to moderate edema to lower extremities.  Diet Order:  Diet heart healthy/carb modified Room service appropriate? Yes; Fluid consistency: Thin  Skin:  Wound (see comment) (lower extremity erythema)  Last BM:  8/21  Height:   Ht Readings from Last 1 Encounters:  02/09/17 5\' 1"  (1.549 m)   Weight:   Wt Readings from Last 1 Encounters:  02/10/17 167 lb 6.4 oz (75.9 kg)   Wt Readings from Last 10 Encounters:  02/10/17 167 lb 6.4 oz (75.9 kg)  11/23/16 134 lb 8 oz (61 kg)  10/10/16 142 lb 10.2 oz (64.7 kg)  10/05/16 142 lb 12.8 oz (64.8 kg)  10/01/16 142 lb (64.4 kg)  08/27/16 148  lb 9.6 oz (67.4 kg)  07/28/16 145 lb (65.8 kg)  07/02/16 142 lb 3.2 oz (64.5 kg)  06/25/16 144 lb (65.3 kg)  06/16/16 146 lb (66.2 kg)   Ideal Body Weight:  47.7 kg  BMI:  Body mass index is 31.63 kg/m. highly skewed  Estimated Nutritional Needs:   Kcal:  1700-1900  Protein:  80-95 gm  Fluid:  1.7-1.9 L  EDUCATION NEEDS:   No education needs identified at this time  Maureen Chatters, RD, LDN Pager #: [phone] After-Hours Pager #: [phone]

## 2017-02-10 NOTE — Evaluation (Signed)
Physical Therapy Evaluation Patient Details Name: Tara Flores MRN: [id number] DOB: December 08, 1935 Today's Date: 02/10/2017   History of Present Illness  81 y.o. female with medical history significant of anxiety, osteoarthritis, asthma, CAD, COPD, depression, type 2 diabetes, hyperlipidemia, hypertension, hypothyroidism, active smoker coming to the emergency department by her PCP due to uncontrolled diabetes, worsening lower extremity edema and increased shortness of breath  Clinical Impression  Orders received for PT evaluation. Patient demonstrates deficits in functional mobility as indicated below. Will benefit from continued skilled PT to address deficits and maximize function. Will see as indicated and progress as tolerated.  At this time, patient states that she has been using w/c for mobility and has aid  As well as HHPT. If accurate, anticipate patient can return home at this level and continue with home services and initial supervision. Otherwise, may need to consider ST SNF.      Follow Up Recommendations Home health PT;Supervision/Assistance - 24 hour (if unable to have supervision initially, may need SNF)    Equipment Recommendations  None recommended by PT    Recommendations for Other Services       Precautions / Restrictions Precautions Precautions: Fall      Mobility  Bed Mobility Overal bed mobility: Needs Assistance Bed Mobility: Supine to Sit     Supine to sit: Supervision     General bed mobility comments: Supervision for safety, no physical assist required  Transfers Overall transfer level: Needs assistance Equipment used: None Transfers: Sit to/from Stand;Stand Pivot Transfers Sit to Stand: Supervision Stand pivot transfers: Supervision       General transfer comment: supervision for safety, no physical assist required  Ambulation/Gait             General Gait Details: patient deferred at this time due to not having LLE shoe/boot  Stairs            Wheelchair Mobility    Modified Rankin (Stroke Patients Only)       Balance                                             Pertinent Vitals/Pain Pain Assessment: No/denies pain    Home Living Family/patient expects to be discharged to:: Unsure Living Arrangements: Non-relatives/Friends Available Help at Discharge: Family;Available PRN/intermittently Type of Home: Mobile home Home Access: Stairs to enter   Entrance Stairs-Number of Steps: 6   Home Equipment: Cane - single point Additional Comments: pt and pt's family arguing during session regarding living arrangements upon d/c    Prior Function Level of Independence: Needs assistance   Gait / Transfers Assistance Needed: working with home PT for ambulation of short distances. Primarily using a w/c  ADL's / Homemaking Assistance Needed: states she does her own ADL but has aide 2x/wk        Hand Dominance   Dominant Hand: Right    Extremity/Trunk Assessment   Upper Extremity Assessment Upper Extremity Assessment: Generalized weakness    Lower Extremity Assessment Lower Extremity Assessment: LLE deficits/detail;Generalized weakness LLE Deficits / Details: patient s/p lis Franc amputation       Communication   Communication: No difficulties  Cognition Arousal/Alertness: Awake/alert Behavior During Therapy: WFL for tasks assessed/performed Overall Cognitive Status: No family/caregiver present to determine baseline cognitive functioning  General Comments: patient with some noted intermittent confusion during session, patient attributes this to just having woke up in a strange environment      General Comments      Exercises     Assessment/Plan    PT Assessment Patient needs continued PT services  PT Problem List Decreased strength;Decreased activity tolerance;Decreased balance;Decreased mobility;Obesity       PT Treatment  Interventions DME instruction;Gait training;Functional mobility training;Therapeutic activities;Therapeutic exercise;Balance training;Neuromuscular re-education;Patient/family education;Modalities    PT Goals (Current goals can be found in the Care Plan section)  Acute Rehab PT Goals Patient Stated Goal: to go home PT Goal Formulation: With patient Time For Goal Achievement: 02/24/17 Potential to Achieve Goals: Good    Frequency Min 3X/week   Barriers to discharge Decreased caregiver support      Co-evaluation               AM-PAC PT "6 Clicks" Daily Activity  Outcome Measure Difficulty turning over in bed (including adjusting bedclothes, sheets and blankets)?: A Little Difficulty moving from lying on back to sitting on the side of the bed? : A Little Difficulty sitting down on and standing up from a chair with arms (e.g., wheelchair, bedside commode, etc,.)?: A Little Help needed moving to and from a bed to chair (including a wheelchair)?: A Little Help needed walking in hospital room?: A Little Help needed climbing 3-5 steps with a railing? : A Lot 6 Click Score: 17    End of Session Equipment Utilized During Treatment: Gait belt;Oxygen Activity Tolerance: Patient tolerated treatment well Patient left: in chair;with call bell/phone within reach;with chair alarm set Nurse Communication: Mobility status PT Visit Diagnosis: Difficulty in walking, not elsewhere classified (R26.2)    Time: 5102-5852 PT Time Calculation (min) (ACUTE ONLY): 20 min   Charges:   PT Evaluation $PT Eval Moderate Complexity: 1 Mod     PT G Codes:        Charlotte Crumb, PT DPT  Board Certified Neurologic Specialist [phone]   Fabio Asa 02/10/2017, 2:09 PM

## 2017-02-10 NOTE — Progress Notes (Signed)
Heart Failure Navigator Consult Note  Presentation:Per Dr Willa Rough is a 81 y.o. female with medical history significant of anxiety, osteoarthritis, asthma, CAD, COPD, depression, type 2 diabetes, hyperlipidemia, hypertension, hypothyroidism, active smoker coming to the emergency department by her PCP due to uncontrolled diabetes, worsening lower extremity edema and increased shortness of breath. She complains of lower extremity edema, occasional orthopnea, wheezing and dry cough. denies fever, chills, headache, sore throat, chest pain, palpitations, dizziness, diaphoresis, PND, abdominal pain, nausea, emesis, diarrhea, constipation, melena, hematochezia, dysuria, frequency, polyuria, polydipsia or blurred vision.  Past Medical History:  Diagnosis Date  . Anxiety   . Arthritis   . Asthma   . CAD (coronary artery disease)   . Cardiac disease   . COPD (chronic obstructive pulmonary disease) (HCC)   . Depression   . Diabetes mellitus without complication (HCC)   . Hyperlipidemia   . Hypertension   . Hypothyroidism     Social History   Social History  . Marital status: Widowed    Spouse name: N/A  . Number of children: N/A  . Years of education: N/A   Social History Main Topics  . Smoking status: Current Every Day Smoker    Packs/day: 0.50    Years: 63.00    Types: Cigarettes  . Smokeless tobacco: Never Used  . Alcohol use 0.0 oz/week     Comment: 02/09/2017 "used to have a glass of wine at night; stopped cause I take so many pills"  . Drug use: No  . Sexual activity: No   Other Topics Concern  . None   Social History Narrative   Diet:      Do you drink/ eat things with caffeine? yes      Marital status:  widowed                             What year were you married ? 1969      Do you live in a house, apartment,assistred living, condo, trailer, etc.)?trailer      Is it one or more stories? no      How many persons live in your home ? none      Do you have  any pets in your home ?(please list) 1 dog      Current or past profession: LPN      Do you exercise?  yes                            Type & how often: with PT      Do you have a living will? no      Do you have a DNR form? no                      If not, do you want to discuss one? yes      Do you have signed POA?HPOA forms?   no              If so, please bring to your        appointment          ECHO:Study Conclusions-11/21/16  - Left ventricle: The cavity size was normal. Wall thickness was   increased in a pattern of mild LVH. Systolic function was normal.   The estimated ejection fraction was in the range of 60% to 65%.   Wall motion  was normal; there were no regional wall motion   abnormalities. Doppler parameters are consistent with a   reversible restrictive pattern, indicative of decreased left   ventricular diastolic compliance and/or increased left atrial   pressure (grade 3 diastolic dysfunction). - Aortic valve: Trileaflet; mildly thickened, moderately calcified   leaflets. There was mild regurgitation. - Mitral valve: Moderately calcified annulus. There was mild to   moderate regurgitation. - Left atrium: The atrium was mildly dilated. - Right ventricle: The cavity size was mildly dilated. Wall   thickness was normal. - Right atrium: The atrium was moderately dilated. - Pulmonary arteries: Systolic pressure was moderately increased.   PA peak pressure: 47 mm Hg (S).  Impressions:  - No cardiac source of emboli was indentified.  ------------------------------------------------------------------- Study data:  Comparison was made to the study of 02/05/2015.  Study status:  Routine.  Procedure:  The patient reported no pain pre or post test. Transthoracic echocardiography. Image quality was adequate.  Study completion:  There were no complications. Transthoracic echocardiography.  M-mode, complete 2D, spectral Doppler, and color Doppler.  Birthdate:   Patient birthdate: 1935-09-05.  Age:  Patient is 81 yr old.  Sex:  Gender: female. BMI: 23.8 kg/m^2.  Blood pressure:     99/63  Patient status: Inpatient.  Study date:  Study date: 11/21/2016. Study time: 10:01 AM.  Location:  Bedside.   BNP    Component Value Date/Time   BNP 471.3 (H) 02/09/2017 1736    ProBNP No results found for: PROBNP   Education Assessment and Provision:  Detailed education and instructions provided on heart failure disease management including the following:  Signs and symptoms of Heart Failure When to call the physician Importance of daily weights Low sodium diet Fluid restriction Medication management Anticipated future follow-up appointments  Patient education given on each of the above topics.  Patient acknowledges understanding and acceptance of all instructions.  I spoke with patient regarding her HF and current hospitalization.  She tells me that she has never been told that she had HF.  She does not weigh each day-yet says she has a scale.  I reinforced daily weights and when to contact the physician related to signs and symptoms.   I reviewed a low sodium diet and high sodium foods to avoid.  She denies any issues getting or taking prescribed medications.  She plans to move in with her grandson at the time of discharge.  Education Materials:  "Living Better With Heart Failure" Booklet, Daily Weight Tracker Tool   High Risk Criteria for Readmission and/or Poor Patient Outcomes:   EF <30%- yes 60-65% with grade 3 dias dys  2 or more admissions in 6 months-3/38mo  Difficult social situation-denies  Demonstrates medication noncompliance-denies    Barriers of Care:  Insight, Knowledge and compliance  Discharge Planning:   Plans to return to home with her grandson in [place].  She could benefit from [facility] for ongoing education and symptom recognition if she does not go to SNF at D/C.

## 2017-02-11 ENCOUNTER — Inpatient Hospital Stay (HOSPITAL_COMMUNITY): Payer: Medicare Other

## 2017-02-11 DIAGNOSIS — Z515 Encounter for palliative care: Secondary | ICD-10-CM

## 2017-02-11 DIAGNOSIS — Z7189 Other specified counseling: Secondary | ICD-10-CM

## 2017-02-11 LAB — BASIC METABOLIC PANEL
Anion gap: 6 (ref 5–15)
BUN: 16 mg/dL (ref 6–20)
CALCIUM: 8.5 mg/dL — AB (ref 8.9–10.3)
CO2: 31 mmol/L (ref 22–32)
Chloride: 99 mmol/L — ABNORMAL LOW (ref 101–111)
Creatinine, Ser: 0.59 mg/dL (ref 0.44–1.00)
GFR calc Af Amer: >60 mL/min (ref >60)
GLUCOSE: 261 mg/dL — AB (ref 65–99)
Potassium: 3.3 mmol/L — ABNORMAL LOW (ref 3.5–5.1)
Sodium: 136 mmol/L (ref 135–145)

## 2017-02-11 LAB — CBC
HEMATOCRIT: 37.5 % (ref 36.0–46.0)
Hemoglobin: 11.9 g/dL — ABNORMAL LOW (ref 12.0–15.0)
MCH: 29.8 pg (ref 26.0–34.0)
MCHC: 31.7 g/dL (ref 30.0–36.0)
MCV: 94 fL (ref 78.0–100.0)
Platelets: 308 10*3/uL (ref 150–400)
RBC: 3.99 MIL/uL (ref 3.87–5.11)
RDW: 14.1 % (ref 11.5–15.5)
WBC: 5.8 10*3/uL (ref 4.0–10.5)

## 2017-02-11 LAB — GLUCOSE, CAPILLARY
GLUCOSE-CAPILLARY: 143 mg/dL — AB (ref 65–99)
GLUCOSE-CAPILLARY: 162 mg/dL — AB (ref 65–99)
GLUCOSE-CAPILLARY: 252 mg/dL — AB (ref 65–99)
GLUCOSE-CAPILLARY: 285 mg/dL — AB (ref 65–99)

## 2017-02-11 MED ORDER — INSULIN ASPART 100 UNIT/ML ~~LOC~~ SOLN
0.0000 [IU] | Freq: Every day | SUBCUTANEOUS | Status: DC
  Administered 2017-02-11 – 2017-02-12 (×2): 3 [IU] via SUBCUTANEOUS
  Administered 2017-02-14: 2 [IU] via SUBCUTANEOUS
  Administered 2017-02-15: 4 [IU] via SUBCUTANEOUS

## 2017-02-11 MED ORDER — INSULIN ASPART 100 UNIT/ML ~~LOC~~ SOLN
0.0000 [IU] | Freq: Three times a day (TID) | SUBCUTANEOUS | Status: DC
  Administered 2017-02-11: 5 [IU] via SUBCUTANEOUS
  Administered 2017-02-12 – 2017-02-13 (×4): 3 [IU] via SUBCUTANEOUS
  Administered 2017-02-13: 5 [IU] via SUBCUTANEOUS
  Administered 2017-02-13: 7 [IU] via SUBCUTANEOUS
  Administered 2017-02-14: 2 [IU] via SUBCUTANEOUS
  Administered 2017-02-14 (×2): 7 [IU] via SUBCUTANEOUS
  Administered 2017-02-15: 5 [IU] via SUBCUTANEOUS
  Administered 2017-02-16: 2 [IU] via SUBCUTANEOUS
  Administered 2017-02-16: 3 [IU] via SUBCUTANEOUS

## 2017-02-11 MED ORDER — POTASSIUM CHLORIDE CRYS ER 20 MEQ PO TBCR
40.0000 meq | EXTENDED_RELEASE_TABLET | Freq: Once | ORAL | Status: AC
  Administered 2017-02-11: 40 meq via ORAL
  Filled 2017-02-11: qty 2

## 2017-02-11 NOTE — Clinical Social Work Placement (Signed)
   CLINICAL SOCIAL WORK PLACEMENT  NOTE  Date:  02/11/2017  Patient Details  Name: Tara Flores MRN: [id number] Date of Birth: 1935-12-18  Clinical Social Work is seeking post-discharge placement for this patient at the Skilled  Nursing Facility level of care (*CSW will initial, date and re-position this form in  chart as items are completed):  Yes   Patient/family provided with [facility] Clinical Social Work Department's list of facilities offering this level of care within the geographic area requested by the patient (or if unable, by the patient's family).  Yes   Patient/family informed of their freedom to choose among providers that offer the needed level of care, that participate in Medicare, Medicaid or managed care program needed by the patient, have an available bed and are willing to accept the patient.  Yes   Patient/family informed of [facility]'s ownership interest in [facility] and [facility], as well as of the fact that they are under no obligation to receive care at these facilities.  PASRR submitted to EDS on 02/11/17     PASRR number received on       Existing PASRR number confirmed on 02/11/17     FL2 transmitted to all facilities in geographic area requested by pt/family on 02/11/17     FL2 transmitted to all facilities within larger geographic area on       Patient informed that his/her managed care company has contracts with or will negotiate with certain facilities, including the following:            Patient/family informed of bed offers received.  Patient chooses bed at       Physician recommends and patient chooses bed at      Patient to be transferred to   on  .  Patient to be transferred to facility by       Patient family notified on   of transfer.  Name of family member notified:        PHYSICIAN Please sign FL2, Please sign DNR     Additional Comment:    _______________________________________________ Margarito Liner,  LCSW 02/11/2017, 2:24 PM

## 2017-02-11 NOTE — Progress Notes (Signed)
Update provided to pt daughter at bedside per pt request

## 2017-02-11 NOTE — CV Procedure (Signed)
2D echo attempted, but patient counselor in room and patient eating in the chair. Will try echo tomorrow morning

## 2017-02-11 NOTE — Progress Notes (Signed)
PROGRESS NOTE    Tara Flores  EBR:[id number] DOB: [birth year] DOA: 02/09/2017 PCP: Annita Brod, MD    Brief Narrative:  Patient is a 81 yo with history of anxiety, osteoarthritis, asthma, COPD witbuse, depression, type 2 diabetes, hypertension, hyperlipemia, hypothyroidism presented to the ED with acute CHF exacerbation.   Assessment & Plan:   Principal Problem:   Acute on chronic diastolic (congestive) heart failure (HCC) Active Problems:   Essential hypertension   Hypothyroidism   Coronary artery disease involving native coronary artery of native heart without angina pectoris   Hypokalemia   Atrial fibrillation with RVR (HCC)   COPD exacerbation (HCC)   Type 2 diabetes mellitus with hypoglycemia (HCC)   Tobacco use disorder   Pulmonary emphysema (HCC)   #1 acute on chronic diastolic heart failure Questionable etiology. Patient with clinical improvement. Cardiac enzymes negative 2.Patient with urine output of 2.8L. 2-D echo pending. Continue IV Lasix. Continue Cardizem,Imdur, lisinopril.If 2-D echo is abnormal we'll consult with cardiology. Follow.  #2 A. Fib with RVR Currently rate controlled on Cardizem. Albuterol has been changed to Xopenex due to tachycardia. Eliquis for anticoagulation.  #3 hypokalemia Secondary to diureses. Replete.  #4 hypothyroidism TSH slightly elevated at 5.494.continue current dose of Synthroid. Outpatient follow-up. Patient will need repeat thyroid function studies done in about 4-6 weeks.  #5 hypertension Stable. Continue lisinopril, Imdur, Cardizem, Lasix.  #6 type 2 diabetes mellitus Hemoglobin A1c was 9.0 11/19/2016.CBGs have ranged from 162-200. Continue NPH.place on sliding scale insulin. Follow.  #7 tobacco use disorder Tobacco cessation. Continue nicotine patch.  #8 COPD Continue scheduled nebs.    DVT prophylaxis: eliquis Code Status: DO NOT RESUSCITATE Family Communication: updated patient. No family at  bedside. Disposition Plan: likely home with home health versus skilled nursing facility.   Consultants:   Palliative care pending  Procedures:   CXR 02/09/2017    Antimicrobials:   Macrobid 02/10/2017>>>>02/15/2017   Subjective: Patient states shortness of breath has improved. No chest pain.   Objective: Vitals:   02/11/17 0221 02/11/17 0700 02/11/17 0825 02/11/17 0900  BP:  128/75  140/78  Pulse:  64    Resp:  20    Temp:  97.6 F (36.4 C)  97.7 F (36.5 C)  TempSrc:  Oral  Oral  SpO2: 92% 96% 95% 92%  Weight:  72.8 kg (160 lb 6.4 oz)    Height:        Intake/Output Summary (Last 24 hours) at 02/11/17 1231 Last data filed at 02/11/17 1115  Gross per 24 hour  Intake             1437 ml  Output             3000 ml  Net            -1563 ml   Filed Weights   02/09/17 2100 02/10/17 0515 02/11/17 0700  Weight: 76 kg (167 lb 8 oz) 75.9 kg (167 lb 6.4 oz) 72.8 kg (160 lb 6.4 oz)    Examination:  General exam: Appears calm and comfortable  Respiratory system: Decreased BS in bases. Cardiovascular system: S1 & S2 heard, RRR. No JVD, murmurs, rubs, gallops or clicks. 1-2 + BLE edema.  Gastrointestinal system: Abdomen is nondistended, soft and nontender. No organomegaly or masses felt. Normal bowel sounds heard. Central nervous system: Alert and oriented. No focal neurological deficits. Extremities: Symmetric 5 x 5 power. Skin: Bilateral lower extremity chronic venous stasis changes. No rashes, lesions or ulcers Psychiatry: Judgement and  insight appear normal. Mood & affect appropriate.     Data Reviewed: I have personally reviewed following labs and imaging studies  CBC:  Recent Labs Lab 02/09/17 1307 02/11/17 0405  WBC 5.8 5.8  NEUTROABS 3.7  --   HGB 12.4 11.9*  HCT 38.5 37.5  MCV 92.3 94.0  PLT 303 308   Basic Metabolic Panel:  Recent Labs Lab 02/09/17 1307 02/09/17 2342 02/10/17 0558 02/11/17 0405  NA 140  --  138 136  K 3.4*  --  3.8 3.3*   CL 105  --  102 99*  CO2 28  --  28 31  GLUCOSE 50*  --  166* 261*  BUN 19  --  15 16  CREATININE 0.61  --  0.54 0.59  CALCIUM 8.5*  --  8.3* 8.5*  MG 1.9 2.0  --   --   PHOS 3.8  --   --   --    GFR: Estimated Creatinine Clearance: 52 mL/min (by C-G formula based on SCr of 0.59 mg/dL). Liver Function Tests:  Recent Labs Lab 02/09/17 1307  AST 19  ALT 17  ALKPHOS 145*  BILITOT 0.5  PROT 7.1  ALBUMIN 2.9*   No results for input(s): LIPASE, AMYLASE in the last 168 hours. No results for input(s): AMMONIA in the last 168 hours. Coagulation Profile: No results for input(s): INR, PROTIME in the last 168 hours. Cardiac Enzymes:  Recent Labs Lab 02/09/17 2342 02/10/17 0558  TROPONINI <0.03 <0.03   BNP (last 3 results) No results for input(s): PROBNP in the last 8760 hours. HbA1C: No results for input(s): HGBA1C in the last 72 hours. CBG:  Recent Labs Lab 02/10/17 1120 02/10/17 1559 02/10/17 2135 02/11/17 0741 02/11/17 1108  GLUCAP 118* 153* 200* 143* 162*   Lipid Profile: No results for input(s): CHOL, HDL, LDLCALC, TRIG, CHOLHDL, LDLDIRECT in the last 72 hours. Thyroid Function Tests:  Recent Labs  02/10/17 0643  TSH 5.494*   Anemia Panel: No results for input(s): VITAMINB12, FOLATE, FERRITIN, TIBC, IRON, RETICCTPCT in the last 72 hours. Sepsis Labs:  Recent Labs Lab 02/09/17 1318  LATICACIDVEN 1.11    No results found for this or any previous visit (from the past 240 hour(s)).       Radiology Studies: Dg Chest 2 View  Result Date: 02/09/2017 CLINICAL DATA:  Lower extremity edema EXAM: CHEST  2 VIEW COMPARISON:  Nov 19, 2016 FINDINGS: There are small pleural effusions bilaterally, slightly larger on the right than on the left. There is slight bibasilar atelectasis. There is trace interstitial edema. There is no appreciable airspace consolidation. Heart is borderline enlarged with pulmonary venous hypertension. No adenopathy. There are foci of  coronary artery calcification as well as aortic atherosclerosis. There also foci of calcification in the right brachial and axillary arteries and calcification in each carotid artery. No evident bone lesions. IMPRESSION: Findings felt to be indicative of a degree of congestive heart failure. No airspace consolidation. Aortic atherosclerosis. There are foci of calcification in each carotid artery as well as in the right axillary and brachial arteries. Foci of coronary artery calcification also noted. Aortic Atherosclerosis (ICD10-I70.0). Electronically Signed   By: Bretta Bang III M.D.   On: 02/09/2017 14:58        Scheduled Meds: . apixaban  5 mg Oral BID  . busPIRone  5 mg Oral QHS  . diltiazem  120 mg Oral q1800  . feeding supplement (GLUCERNA SHAKE)  237 mL Oral TID WC  .  furosemide  40 mg Intravenous BID  . insulin aspart  0-5 Units Subcutaneous QHS  . insulin aspart  0-9 Units Subcutaneous TID WC  . insulin NPH Human  12 Units Subcutaneous QHS  . insulin NPH Human  25 Units Subcutaneous QAC breakfast  . ipratropium  0.5 mg Nebulization Q6H  . isosorbide mononitrate  15 mg Oral Daily  . levalbuterol  0.63 mg Nebulization Q6H  . levothyroxine  150 mcg Oral QAC breakfast  . lisinopril  2.5 mg Oral Daily  . nicotine  14 mg Transdermal Daily  . nitrofurantoin  100 mg Oral BID   Continuous Infusions:   LOS: 1 day    Time spent: 35 mins    THOMPSON,DANIEL, MD [facility] Pager [phone]  If 7PM-7AM, please contact night-coverage [email or web address] Password [facility] 02/11/2017, 12:31 PM

## 2017-02-11 NOTE — Consult Note (Signed)
Consultation Note Date: 02/11/2017   Patient Name: Tara Flores  DOB: 05-09-1936  MRN: [id number]  Age / Sex: 81 y.o., female  PCP: Clovia Cuff, MD Referring Physician: Eugenie Filler, MD  Reason for Consultation: Disposition, Establishing goals of care and Psychosocial/spiritual support  HPI/Patient Profile: 81 y.o. female  with past medical history of COPD, asthma, CAD, HTN, OA, DM2, depression, and legally blind. She presents with uncontrolled diabetes, and worsening lower extremity edema and shortness of breath. In the ED she was given albuterol and developed RVR. Imaging consistent with CHF. She was admitted on 02/09/2017. She is being treated for an acute CHF exacerbation and A. Fib with RVR. HF team provided extensive education on dCHF, as this is a new diagnosis for her. Palliative consulted to assist in clarifying goals of care.   Clinical Assessment and Goals of Care: Tara Flores is much more calm and oriented compared to when I met her on a prior admission. She had good insight into her medical issues, including her new diagnosis of CHF. She was able to explain the basic medical management for her primary medical issues, as well as general health maintenance necessary for staying healthy.   We talked through her perception of her overall health, and what the future might look like. She expressed the strong desire to stay healthy, with the specific goal of outliving her dog (a 10yo beagle/corgi mix). She wants to maintain her independence and is attempting to maximize family support in an effort to do that. She is currently working with her grandson to fix up a trailer where they will live together. At the point that her dog dies or she is too sick to be predominantly independent, she is then willing to consider long term SNF placement.  While her plan is clear, I did express my reservations about her grandson providing adequate support. PT  recommended 24 hour home assist, which he will not be able to provide. Additionally, there is a gap of ~8 days from when she will likely be discharged to when the trailer will be ready. During this time she plans to return to her prior apartment where she has a roommate but no consistent support. She acknowledged my concerns but reiterated that she "will make it work, even if it is not ideal." She understands her health could decline as a result of her decision (she in fact listed possible outcomes, including falling and medication confusion), but is willing to accept these risks so long as she is not in a SNF. Of note, she is amenable to a very short term stay at rehab, but wants assurance she can leave at any time without financial ramification.  Two of her daughters were present for our conversation. They privately expressed concerns that she goes through periods of self-neglect: not taking her medication, avoiding medical appointments, etc., and they are trying to understand the balance of a person's right to make bad decisions, versus someone lacking capacity to make decisions. I shared that she did have capacity at present. She has intact cognition and is making her decisions with a clear understanding of the options, acknowledgement of the recommendations and rationale for them, and the ability to discuss the implications/ramifications of her choices. If things should change I encouraged them to seek out a capacity evaluation.  Finally, I did discuss the possibility of Hospice with Tara Flores (at the request of her daughters). She intends to continue seeking out aggressive care, and plans to return to the hospital as  needed for acute issues. She is not yet in a place to consider a comfort focused approach, but rather wants to continue optimizing her health (so long as she is capable of being outside of a SNF).   Primary Decision Maker PATIENT   SUMMARY OF RECOMMENDATIONS    DNR, continue full scope  aggressive care  SW asked to clarify rehab allowance for leaving AMA. If there is financial ramifications, Tara Flores plans to return to her prior home environment and will need all covered services ([facility] PT, OT, RN, aid, and Palliative). If no financial issues, she will go there short term. Would have palliative follow there.  Family asking about new orthopedic boot as hers is missing padding, CM notified  Code Status/Advance Care Planning:  DNR  Additional Recommendations (Limitations, Scope, Preferences):  Full Scope Treatment  Psycho-social/Spiritual:   Desire for further Chaplaincy support:no  Additional Recommendations: Education on Hospice  Prognosis:   Unable to determine  Discharge Planning: To Be Determined      Primary Diagnoses: Present on Admission: . Acute on chronic diastolic (congestive) heart failure ([place]) . COPD exacerbation ([place]) . Hypokalemia . Essential hypertension . Hypothyroidism . Atrial fibrillation with RVR ([place]) . Type 2 diabetes mellitus with hypoglycemia ([place]) . Coronary artery disease involving native coronary artery of native heart without angina pectoris   I have reviewed the medical record, interviewed the patient and family, and examined the patient. The following aspects are pertinent.  Past Medical History:  Diagnosis Date  . Anxiety   . Arthritis   . Asthma   . CAD (coronary artery disease)   . Cardiac disease   . COPD (chronic obstructive pulmonary disease) ([place])   . Depression   . Diabetes mellitus without complication ([place])   . Hyperlipidemia   . Hypertension   . Hypothyroidism    Social History   Social History  . Marital status: Widowed    Spouse name: N/A  . Number of children: N/A  . Years of education: N/A   Social History Main Topics  . Smoking status: Current Every Day Smoker    Packs/day: 0.50    Years: 63.00    Types: Cigarettes  . Smokeless tobacco: Never Used  . Alcohol use 0.0 oz/week     Comment:  02/09/2017 "used to have a glass of wine at night; stopped cause I take so many pills"  . Drug use: No  . Sexual activity: No   Other Topics Concern  . None   Social History Narrative   Diet:      Do you drink/ eat things with caffeine? yes      Marital status:  widowed                             What year were you married ? 1969      Do you live in a house, apartment,assistred living, condo, trailer, etc.)?trailer      Is it one or more stories? no      How many persons live in your home ? none      Do you have any pets in your home ?(please list) 1 dog      Current or past profession: LPN      Do you exercise?  yes                            Type & how  often: with PT      Do you have a living will? no      Do you have a DNR form? no                      If not, do you want to discuss one? yes      Do you have signed POA?HPOA forms?   no              If so, please bring to your        appointment         Family History  Problem Relation Age of Onset  . Alcohol abuse Father   . Cancer Mother        lung  . Heart disease Mother   . Diabetes Sister   . Heart disease Sister    Scheduled Meds: . apixaban  5 mg Oral BID  . busPIRone  5 mg Oral QHS  . diltiazem  120 mg Oral q1800  . feeding supplement (GLUCERNA SHAKE)  237 mL Oral TID WC  . furosemide  40 mg Intravenous BID  . insulin NPH Human  12 Units Subcutaneous QHS  . insulin NPH Human  25 Units Subcutaneous QAC breakfast  . ipratropium  0.5 mg Nebulization Q6H  . isosorbide mononitrate  15 mg Oral Daily  . levalbuterol  0.63 mg Nebulization Q6H  . levothyroxine  150 mcg Oral QAC breakfast  . lisinopril  2.5 mg Oral Daily  . nicotine  14 mg Transdermal Daily  . nitrofurantoin  100 mg Oral BID   Continuous Infusions: PRN Meds:.acetaminophen, hydrOXYzine, levalbuterol, metoprolol tartrate, nitroGLYCERIN, ondansetron **OR** ondansetron (ZOFRAN) IV Allergies  Allergen Reactions  . Brethine [Terbutaline]       Made patient confused  . Keflex [Cephalexin] Nausea Only    Loss of appetite   Review of Systems  Constitutional: Positive for activity change, fatigue and unexpected weight change. Negative for appetite change.  HENT: Positive for dental problem (dentures no longer fit). Negative for facial swelling, mouth sores, sinus pressure, sore throat and trouble swallowing.   Eyes: Positive for visual disturbance (legally blind).  Respiratory: Negative for chest tightness and shortness of breath (none presently).   Cardiovascular: Positive for leg swelling (improved). Negative for chest pain.  Gastrointestinal: Positive for abdominal distention. Negative for abdominal pain, nausea and vomiting.  Genitourinary: Positive for frequency (r/t lasix). Negative for difficulty urinating, dysuria and flank pain.  Musculoskeletal: Positive for gait problem. Negative for back pain.  Skin: Positive for pallor.  Neurological: Positive for weakness. Negative for dizziness and speech difficulty.  Psychiatric/Behavioral: Negative for confusion, decreased concentration and sleep disturbance. The patient is not nervous/anxious.    Physical Exam  Constitutional: She is oriented to person, place, and time. She appears well-developed and well-nourished. No distress.  HENT:  Head: Normocephalic and atraumatic.  Mouth/Throat: Oropharynx is clear and moist. No oropharyngeal exudate.  Poorly fitted dentures  Eyes: EOM are normal.  Blind  Neck: Normal range of motion. Neck supple.  Cardiovascular: Normal rate.  An irregularly irregular rhythm present.  Pulmonary/Chest: Effort normal. No accessory muscle usage. She has decreased breath sounds in the right lower field and the left lower field. She has rhonchi.  Abdominal: Soft. Normal appearance.  Musculoskeletal: She exhibits edema (trace in BLE).  Partially amputated left foot, dressing in place  Neurological: She is alert and oriented to person, place, and time.   Skin: Skin is warm and dry.  There is pallor.  Chronic venous stasis dermatitis in BLE  Psychiatric: She has a normal mood and affect. Her behavior is normal. Judgment and thought content normal.    Vital Signs: BP 128/75 (BP Location: Left Arm)   Pulse 64   Temp 97.6 F (36.4 C) (Oral)   Resp 20   Ht 5' 1" (1.549 m)   Wt 72.8 kg (160 lb 6.4 oz)   SpO2 96%   BMI 30.31 kg/m  Pain Assessment: No/denies pain   Pain Score: 0-No pain   SpO2: SpO2: 96 % O2 Device:SpO2: 96 % O2 Flow Rate: .O2 Flow Rate (L/min): 2 L/min  IO: Intake/output summary:  Intake/Output Summary (Last 24 hours) at 02/11/17 0754 Last data filed at 02/11/17 0012  Gross per 24 hour  Intake             1077 ml  Output             2800 ml  Net            -1723 ml    LBM: Last BM Date: 02/09/17 Baseline Weight: Weight: 76 kg (167 lb 8 oz) Most recent weight: Weight: 72.8 kg (160 lb 6.4 oz)     Palliative Assessment/Data: PPS 50%    Time Total: 70 minutes Greater than 50%  of this time was spent counseling and coordinating care related to the above assessment and plan.  Signed by: Charlynn Court, NP Palliative Medicine Team Pager # [phone] (M-F 7a-5p) Team Phone # [phone] (Nights/Weekends)

## 2017-02-11 NOTE — Progress Notes (Signed)
Patient is active with [facility] for [name] services as prior to admission for HHRN,PT and nurses aide as prior to admission; Tara Flores [phone]

## 2017-02-11 NOTE — NC FL2 (Signed)
MEDICAID FL2 LEVEL OF CARE SCREENING TOOL     IDENTIFICATION  Patient Name: Tara Flores Birthdate: 05/08/1936 Sex: female Admission Date (Current Location): 02/09/2017  [facility] and [place] Number:  Producer, television/film/video and Address:  [facility], [address] [place], [place] [address]      Provider Number: [id number]  Attending Physician Name and Address:  Rodolph Bong, MD  Relative Name and Phone Number:       Current Level of Care: Hospital Recommended Level of Care: Skilled Nursing Facility Prior Approval Number:    Date Approved/Denied:   PASRR Number: [id number]  Discharge Plan: SNF    Current Diagnoses: Patient Active Problem List   Diagnosis Date Noted  . Tobacco use disorder 02/10/2017  . Pulmonary emphysema (HCC)   . Acute on chronic diastolic (congestive) heart failure (HCC) 02/09/2017  . Type 2 diabetes mellitus with hypoglycemia (HCC) 02/09/2017  . New onset a-fib (HCC) 11/20/2016  . Atrial fibrillation with RVR (HCC)   . COPD exacerbation (HCC)   . Goals of care, counseling/discussion   . Palliative care by specialist   . TIA (transient ischemic attack) 11/19/2016  . Diabetic foot ulcer (HCC) 10/10/2016  . Hypokalemia 10/10/2016  . Anemia due to chronic kidney disease 10/10/2016  . Cellulitis in diabetic foot (HCC) 10/09/2016  . Venous ulcer of left leg (HCC) 07/28/2016  . Dry skin dermatitis 07/28/2016  . Type 2 diabetes mellitus with hyperglycemia (HCC) 02/06/2015  . Hip fracture (HCC) 02/05/2015  . Closed right hip fracture (HCC) 02/05/2015  . Abnormal EKG 02/05/2015  . Tobacco abuse 02/05/2015  . DM2 (diabetes mellitus, type 2) (HCC) 02/05/2015  . Essential hypertension 02/05/2015  . Hypothyroidism 02/05/2015  . Coronary artery disease involving native coronary artery of native heart without angina pectoris     Orientation RESPIRATION BLADDER Height & Weight     Self, Time, Situation, Place   Normal Continent, External catheter Weight: 160 lb 6.4 oz (72.8 kg) Height:  5\' 1"  (154.9 cm)  BEHAVIORAL SYMPTOMS/MOOD NEUROLOGICAL BOWEL NUTRITION STATUS   (None)  (None) Continent Diet (Heart healthy/carb modified)  AMBULATORY STATUS COMMUNICATION OF NEEDS Skin   Limited Assist Verbally Surgical wounds, Other (Comment) (Cellulitis)                       Personal Care Assistance Level of Assistance              Functional Limitations Info  Sight, Hearing, Speech Sight Info: Impaired (Legally blind) Hearing Info: Adequate Speech Info: Adequate    SPECIAL CARE FACTORS FREQUENCY  PT (By licensed PT), Blood pressure     PT Frequency: 5 x week              Contractures Contractures Info: Not present    Additional Factors Info  Code Status, Allergies Code Status Info: DNR Allergies Info: Brethine (Terbutaline), Keflex (Cephalexin)           Current Medications (02/11/2017):  This is the current hospital active medication list Current Facility-Administered Medications  Medication Dose Route Frequency Provider Last Rate Last Dose  . acetaminophen (TYLENOL) tablet 650 mg  650 mg Oral Q6H PRN Bobette Mo, MD      . apixaban Everlene Balls) tablet 5 mg  5 mg Oral BID Bobette Mo, MD   5 mg at 02/11/17 4401  . busPIRone (BUSPAR) tablet 5 mg  5 mg Oral QHS Bobette Mo, MD  5 mg at 02/10/17 2127  . diltiazem (CARDIZEM CD) 24 hr capsule 120 mg  120 mg Oral q1800 Bobette Mo, MD   120 mg at 02/10/17 1814  . feeding supplement (GLUCERNA SHAKE) (GLUCERNA SHAKE) liquid 237 mL  237 mL Oral TID WC Rodolph Bong, MD   237 mL at 02/11/17 1100  . furosemide (LASIX) injection 40 mg  40 mg Intravenous BID Bobette Mo, MD   40 mg at 02/11/17 0932  . hydrOXYzine (ATARAX/VISTARIL) tablet 10 mg  10 mg Oral TID PRN Rodolph Bong, MD      . insulin aspart (novoLOG) injection 0-5 Units  0-5 Units Subcutaneous QHS Rodolph Bong, MD      .  insulin aspart (novoLOG) injection 0-9 Units  0-9 Units Subcutaneous TID WC Rodolph Bong, MD      . insulin NPH Human (HUMULIN N,NOVOLIN N) injection 12 Units  12 Units Subcutaneous QHS Bobette Mo, MD   12 Units at 02/10/17 2137  . insulin NPH Human (HUMULIN N,NOVOLIN N) injection 25 Units  25 Units Subcutaneous QAC breakfast Bobette Mo, MD   25 Units at 02/11/17 0801  . ipratropium (ATROVENT) nebulizer solution 0.5 mg  0.5 mg Nebulization Q6H Bobette Mo, MD   0.5 mg at 02/11/17 0825  . isosorbide mononitrate (IMDUR) 24 hr tablet 15 mg  15 mg Oral Daily Bobette Mo, MD   15 mg at 02/11/17 0930  . levalbuterol (XOPENEX) nebulizer solution 0.63 mg  0.63 mg Nebulization Q6H Bobette Mo, MD   0.63 mg at 02/11/17 0825  . levalbuterol (XOPENEX) nebulizer solution 0.63 mg  0.63 mg Nebulization Q6H PRN Bobette Mo, MD      . levothyroxine (SYNTHROID, LEVOTHROID) tablet 150 mcg  150 mcg Oral QAC breakfast Bobette Mo, MD   150 mcg at 02/11/17 0654  . lisinopril (PRINIVIL,ZESTRIL) tablet 2.5 mg  2.5 mg Oral Daily Bobette Mo, MD   2.5 mg at 02/11/17 4098  . metoprolol tartrate (LOPRESSOR) injection 2.5 mg  2.5 mg Intravenous Q4H PRN Bobette Mo, MD      . nicotine (NICODERM CQ - dosed in mg/24 hours) patch 14 mg  14 mg Transdermal Daily Bobette Mo, MD   14 mg at 02/11/17 1191  . nitrofurantoin (MACRODANTIN) capsule 100 mg  100 mg Oral BID Bobette Mo, MD   100 mg at 02/11/17 4782  . nitroGLYCERIN (NITROSTAT) SL tablet 0.4 mg  0.4 mg Sublingual Q5 min PRN Bobette Mo, MD      . ondansetron [facility]) tablet 4 mg  4 mg Oral Q6H PRN Bobette Mo, MD       Or  . ondansetron [facility]) injection 4 mg  4 mg Intravenous Q6H PRN Bobette Mo, MD         Discharge Medications: Please see discharge summary for a list of discharge medications.  Relevant Imaging Results:  Relevant Lab  Results:   Additional Information SS#: [id number]. Wants to smoke 5 cigarettes per day at the facility.  Margarito Liner, LCSW

## 2017-02-11 NOTE — Clinical Social Work Note (Signed)
PT recommending home health. MD put in orders for home health this morning. Per Heart Failure note from yesterday, plan is for patient to return home with her grandson.   CSW signing off. Consult again if any other social work needs arise.  Charlynn Court, CSW [phone]

## 2017-02-11 NOTE — Addendum Note (Signed)
Addendum  created 02/11/17 1328 by Dazaria Macneill, MD   Sign clinical note    

## 2017-02-11 NOTE — Progress Notes (Signed)
Ordered high low bed, pt set up and oriented to the room

## 2017-02-11 NOTE — Progress Notes (Signed)
Inpatient Diabetes Program Recommendations  AACE/ADA: New Consensus Statement on Inpatient Glycemic Control (2015)  Target Ranges:  Prepandial:   less than 140 mg/dL      Peak postprandial:   less than 180 mg/dL (1-2 hours)      Critically ill patients:  140 - 180 mg/dL   Lab Results  Component Value Date   GLUCAP 162 (H) 02/11/2017   HGBA1C 9.0 (H) 11/19/2016    Review of Glycemic Control  Results for LYNEL, LYNES (MRN [id number]) as of 02/11/2017 11:31  Ref. Range 02/10/2017 11:20 02/10/2017 15:59 02/10/2017 21:35 02/11/2017 07:41 02/11/2017 11:08  Glucose-Capillary Latest Ref Range: 65 - 99 mg/dL 115 (H) 726 (H) 203 (H) 143 (H) 162 (H)    Diabetes history: Type 2 Outpatient Diabetes medications: NPH 25 units qam, NPH 12 units qpm from medical record Current orders for Inpatient glycemic control: NPH 25 units qam, NPH 12 units qpm  Inpatient Diabetes Program Recommendations: Consider adding Novolog 0-9 units tid, Novolog 0-5 units qhs.   Susette Racer, RN, BA, MHA, CDE Diabetes Coordinator Inpatient Diabetes Program  [phone] (Team Pager) [phone] [facility] Office) 02/11/2017 11:39 AM

## 2017-02-11 NOTE — Progress Notes (Signed)
Physical Therapy Treatment Patient Details Name: Tara Flores MRN: [id number] DOB: May 07, 1936 Today's Date: 02/11/2017    History of Present Illness 81 y.o. female with medical history significant of anxiety, osteoarthritis, asthma, CAD, COPD, depression, type 2 diabetes, hyperlipidemia, hypertension, hypothyroidism, active smoker coming to the emergency department by her PCP due to uncontrolled diabetes, worsening lower extremity edema and increased shortness of breath    PT Comments    Patient OOB in chair upon arrival. Pt lead through bilat UE and LE therex. Pt declined transfers/standing despite encouragement due to fear of urinary incontinence with standing. Pt will need 24 hour assist at home initially. Continue to progress as tolerated.    Follow Up Recommendations  Supervision/Assistance - 24 hour;Home health PT     Equipment Recommendations  None recommended by PT    Recommendations for Other Services       Precautions / Restrictions Precautions Precautions: Fall Restrictions Weight Bearing Restrictions: No    Mobility  Bed Mobility               General bed mobility comments: pt OOB in chair upon arrival  Transfers                 General transfer comment: pt declined standing due to fear of urinary incontinence despite encouragement   Ambulation/Gait                 Stairs            Wheelchair Mobility    Modified Rankin (Stroke Patients Only)       Balance                                            Cognition Arousal/Alertness: Awake/alert Behavior During Therapy: WFL for tasks assessed/performed Overall Cognitive Status: No family/caregiver present to determine baseline cognitive functioning                                        Exercises Total Joint Exercises Knee Flexion: AROM;Both;10 reps;Seated;Other (comment) (isometric; with resistance from therapist) General Exercises -  Upper Extremity Shoulder Flexion: AROM;Both;20 reps Elbow Flexion: AROM;Both;20 reps (with weight) Chair Push Up: AROM;Both;15 reps General Exercises - Lower Extremity Long Arc Quad: AROM;Both;20 reps Hip Flexion/Marching: AROM;Both;20 reps    General Comments        Pertinent Vitals/Pain Pain Assessment: No/denies pain    Home Living                      Prior Function            PT Goals (current goals can now be found in the care plan section) Acute Rehab PT Goals Patient Stated Goal: to go home PT Goal Formulation: With patient Time For Goal Achievement: 02/24/17 Potential to Achieve Goals: Good Progress towards PT goals: Progressing toward goals    Frequency    Min 3X/week      PT Plan Current plan remains appropriate    Co-evaluation              AM-PAC PT "6 Clicks" Daily Activity  Outcome Measure  Difficulty turning over in bed (including adjusting bedclothes, sheets and blankets)?: A Little Difficulty moving from lying on back to sitting on the side of  the bed? : A Little Difficulty sitting down on and standing up from a chair with arms (e.g., wheelchair, bedside commode, etc,.)?: A Little Help needed moving to and from a bed to chair (including a wheelchair)?: A Little Help needed walking in hospital room?: A Lot Help needed climbing 3-5 steps with a railing? : A Lot 6 Click Score: 16    End of Session Equipment Utilized During Treatment: Gait belt Activity Tolerance: Patient tolerated treatment well Patient left: in chair;with call bell/phone within reach;with chair alarm set Nurse Communication: Mobility status PT Visit Diagnosis: Difficulty in walking, not elsewhere classified (R26.2)     Time: 2446-2863 PT Time Calculation (min) (ACUTE ONLY): 24 min  Charges:  $Therapeutic Exercise: 23-37 mins                    G Codes:       Erline Levine, PTA Pager: [phone]     Carolynne Edouard 02/11/2017, 10:58 AM

## 2017-02-11 NOTE — Progress Notes (Signed)
Pt refusing to ambulate in hallway. Pt states she needs ortho boot from home, daughter aware and stated she will bring tomorrow morning. Pt ambulating in room with walker and staff assistance

## 2017-02-11 NOTE — Evaluation (Signed)
Occupational Therapy Evaluation Patient Details Name: Tara Flores MRN: [id number] DOB: June 12, 1936 Today's Date: 02/11/2017    History of Present Illness 81 y.o. female with medical history significant of anxiety, osteoarthritis, asthma, CAD, COPD, depression, type 2 diabetes, hyperlipidemia, hypertension, hypothyroidism, active smoker coming to the emergency department by her PCP due to uncontrolled diabetes, worsening lower extremity edema and increased shortness of breath   Clinical Impression   Pt is at min A - min guard A level with LB ADLs and sup - min guard A with ADL mobility. Pt will not have any assist at home and [facility] had been recommended by PT, however pt planning to d/c to SNF and confimred by SW. Will defer furhter OT intervention to SNF  Follow Up Recommendations  SNF    Equipment Recommendations  3 in 1 bedside commode;Tub/shower seat;Other (comment) Lexicographer)    Recommendations for Other Services       Precautions / Restrictions Precautions Precautions: Fall Restrictions Weight Bearing Restrictions: No      Mobility Bed Mobility               General bed mobility comments: pt OOB in chair upon arrival  Transfers Overall transfer level: Needs assistance Equipment used: None Transfers: Sit to/from Stand;Stand Pivot Transfers Sit to Stand: Supervision Stand pivot transfers: Supervision            Balance Overall balance assessment: Needs assistance   Sitting balance-Leahy Scale: Good       Standing balance-Leahy Scale: Poor                             ADL either performed or assessed with clinical judgement   ADL Overall ADL's : Needs assistance/impaired     Grooming: Wash/dry hands;Wash/dry face;Sitting;Set up   Upper Body Bathing: Sitting;Set up   Lower Body Bathing: Min guard;Minimal assistance;Sit to/from stand   Upper Body Dressing : Set up;Sitting   Lower Body Dressing: Min guard;Minimal assistance;Sit to/from  stand   Toilet Transfer: Supervision/safety;BSC;Stand-pivot   Toileting- Architect and Hygiene: Min guard         General ADL Comments: pt fearful of falling     Vision Baseline Vision/History: Legally blind Patient Visual Report: No change from baseline       Perception     Praxis      Pertinent Vitals/Pain Pain Assessment: No/denies pain     Hand Dominance Right   Extremity/Trunk Assessment Upper Extremity Assessment Upper Extremity Assessment: Generalized weakness   Lower Extremity Assessment Lower Extremity Assessment: Defer to PT evaluation       Communication Communication Communication: No difficulties   Cognition Arousal/Alertness: Awake/alert Behavior During Therapy: WFL for tasks assessed/performed Overall Cognitive Status: Within Functional Limits for tasks assessed                                     General Comments   pt very pleasant and cooperative               Home Living Family/patient expects to be discharged to::  (upon entering room, SW in with pt/family discussing SNF placement) Living Arrangements: Non-relatives/Friends Available Help at Discharge: Family;Available PRN/intermittently Type of Home: Mobile home Home Access: Stairs to enter Entrance Stairs-Number of Steps: 6         Bathroom Shower/Tub: Chief Strategy Officer: Standard  Home Equipment: Gilmer Mor - single point          Prior Functioning/Environment Level of Independence: Needs assistance  Gait / Transfers Assistance Needed: working with home PT for ambulation of short distances. Primarily using a w/c ADL's / Homemaking Assistance Needed: states she does her own ADL but has aide 2x/wk            OT Problem List: Decreased activity tolerance;Decreased knowledge of use of DME or AE;Impaired balance (sitting and/or standing);Pain      OT Treatment/Interventions:      OT Goals(Current goals can be found in the  care plan section) Acute Rehab OT Goals Patient Stated Goal: to go to SNF for rehan and then go home OT Goal Formulation: With patient/family  OT Frequency:     Barriers to D/C: Decreased caregiver support  HH had been recommended by PT, however pt planning to d/c to SNF and confimred by SW       Co-evaluation              AM-PAC PT "6 Clicks" Daily Activity     Outcome Measure Help from another person eating meals?: None Help from another person taking care of personal grooming?: A Little Help from another person toileting, which includes using toliet, bedpan, or urinal?: A Little Help from another person bathing (including washing, rinsing, drying)?: A Lot Help from another person to put on and taking off regular upper body clothing?: A Little Help from another person to put on and taking off regular lower body clothing?: A Lot 6 Click Score: 17   End of Session Equipment Utilized During Treatment: Gait belt;Rolling walker;Other (comment) (BSC)  Activity Tolerance: Patient tolerated treatment well Patient left: in chair;with call bell/phone within reach;with chair alarm set;with family/visitor present  OT Visit Diagnosis: Unsteadiness on feet (R26.81);Muscle weakness (generalized) (M62.81);Low vision, both eyes (H54.2);Pain                Time: 1340-1409 OT Time Calculation (min): 29 min Charges:  OT General Charges $OT Visit: 1 Procedure OT Evaluation $OT Eval Moderate Complexity: 1 Procedure OT Treatments $Therapeutic Activity: 8-22 mins G-Codes: OT G-codes **NOT FOR INPATIENT CLASS** Functional Assessment Tool Used: AM-PAC 6 Clicks Daily Activity     Galen Manila 02/11/2017, 2:57 PM

## 2017-02-11 NOTE — Clinical Social Work Note (Signed)
Clinical Social Work Assessment  Patient Details  Name: Tara Flores MRN: [id number] Date of Birth: 01/14/1936  Date of referral:  02/11/17               Reason for consult:  Facility Placement, Discharge Planning                Permission sought to share information with:  Facility Sport and exercise psychologist, Family Supports Permission granted to share information::  Yes, Verbal Permission Granted  Name::     Tree surgeon::  SNF's  Relationship::  Daughter  Contact Information:  [phone]  Housing/Transportation Living arrangements for the past 2 months:  Mobile Home Source of Information:  Patient, Medical Team, Adult Children Patient Interpreter Needed:  None Criminal Activity/Legal Involvement Pertinent to Current Situation/Hospitalization:  No - Comment as needed Significant Relationships:  Adult Children, Other Family Members Lives with:  Self Do you feel safe going back to the place where you live?  No Need for family participation in patient care:  Yes (Comment)  Care giving concerns:  PT recommending HHPT but patient does not currently have 24/7 supervision and does not live in a safe environment, per medical staff.   Social Worker assessment / plan:  CSW met with patient. Daughter at bedside. CSW introduced role and explained that discharge planning would be discussed. Patient is agreeable to SNF placement until she moves in with her grandson and his girlfriend at the first of September. CSW confirmed with hospital liaison for a local facility that patient would not be charged full price if she left the facility when she herself was ready to leave. He stated that the insurance company would pay for the days that she was there. Patient's preferences are [place], and [place]. She wants the facilities to understand that she wants to be able to smoke five cigarettes per day even though she has a patch on. No further concerns. CSW encouraged patient and  her daughter to contact CSW as needed. CSW will continue to follow patient and her daughter for support and facilitate discharge to SNF once medically stable.  Employment status:  Retired Nurse, adult PT Recommendations:  Home with [place] / Referral to community resources:  [place]  Patient/Family's Response to care:  Patient and her daughter agreeable to SNF placement. Patient's family supportive and involved in patient's care. Patient and her sister appreciated social work intervention.  Patient/Family's Understanding of and Emotional Response to Diagnosis, Current Treatment, and Prognosis:  Patient and her daughter have a good understanding of the reason for admission. Patient prefers to go home with HHPT but does not have a stable living situation until the first of September. Patient and her daughter appear happy with hospital care.  Emotional Assessment Appearance:  Appears stated age Attitude/Demeanor/Rapport:  Other (Pleasant) Affect (typically observed):  Accepting, Appropriate, Calm, Pleasant Orientation:  Oriented to Self, Oriented to Place, Oriented to  Time, Oriented to Situation Alcohol / Substance use:  Tobacco Use Psych involvement (Current and /or in the community):  No (Comment)  Discharge Needs  Concerns to be addressed:  Care Coordination Readmission within the last 30 days:  No Current discharge risk:  Dependent with Mobility, Lives alone Barriers to Discharge:  Continued Medical Work up   Candie Chroman, LCSW 02/11/2017, 2:20 PM

## 2017-02-12 ENCOUNTER — Inpatient Hospital Stay (HOSPITAL_COMMUNITY): Payer: Medicare Other

## 2017-02-12 DIAGNOSIS — I340 Nonrheumatic mitral (valve) insufficiency: Secondary | ICD-10-CM

## 2017-02-12 DIAGNOSIS — I361 Nonrheumatic tricuspid (valve) insufficiency: Secondary | ICD-10-CM

## 2017-02-12 LAB — BASIC METABOLIC PANEL
Anion gap: 7 (ref 5–15)
BUN: 20 mg/dL (ref 6–20)
CALCIUM: 8.6 mg/dL — AB (ref 8.9–10.3)
CO2: 31 mmol/L (ref 22–32)
Chloride: 99 mmol/L — ABNORMAL LOW (ref 101–111)
Creatinine, Ser: 0.67 mg/dL (ref 0.44–1.00)
GFR calc Af Amer: >60 mL/min (ref >60)
GLUCOSE: 243 mg/dL — AB (ref 65–99)
Potassium: 3.8 mmol/L (ref 3.5–5.1)
Sodium: 137 mmol/L (ref 135–145)

## 2017-02-12 LAB — GLUCOSE, CAPILLARY
GLUCOSE-CAPILLARY: 209 mg/dL — AB (ref 65–99)
GLUCOSE-CAPILLARY: 255 mg/dL — AB (ref 65–99)
Glucose-Capillary: 242 mg/dL — ABNORMAL HIGH (ref 65–99)
Glucose-Capillary: 224 mg/dL — ABNORMAL HIGH (ref 65–99)

## 2017-02-12 LAB — ECHOCARDIOGRAM COMPLETE
HEIGHTINCHES: 61 in
Weight: 2537.6 oz

## 2017-02-12 MED ORDER — FUROSEMIDE 40 MG PO TABS
40.0000 mg | ORAL_TABLET | Freq: Two times a day (BID) | ORAL | Status: DC
  Administered 2017-02-12 – 2017-02-14 (×4): 40 mg via ORAL
  Filled 2017-02-12 (×4): qty 1

## 2017-02-12 MED ORDER — BUDESONIDE 0.5 MG/2ML IN SUSP
0.5000 mg | Freq: Two times a day (BID) | RESPIRATORY_TRACT | Status: DC
  Administered 2017-02-12 – 2017-02-16 (×8): 0.5 mg via RESPIRATORY_TRACT
  Filled 2017-02-12 (×8): qty 2

## 2017-02-12 MED ORDER — IPRATROPIUM BROMIDE 0.02 % IN SOLN
0.5000 mg | Freq: Three times a day (TID) | RESPIRATORY_TRACT | Status: DC
  Administered 2017-02-13 – 2017-02-14 (×4): 0.5 mg via RESPIRATORY_TRACT
  Filled 2017-02-12 (×4): qty 2.5

## 2017-02-12 MED ORDER — ONDANSETRON HCL 4 MG/2ML IJ SOLN
INTRAMUSCULAR | Status: AC
  Filled 2017-02-12: qty 2

## 2017-02-12 MED ORDER — LEVALBUTEROL HCL 0.63 MG/3ML IN NEBU
0.6300 mg | INHALATION_SOLUTION | Freq: Three times a day (TID) | RESPIRATORY_TRACT | Status: DC
  Administered 2017-02-13 – 2017-02-14 (×4): 0.63 mg via RESPIRATORY_TRACT
  Filled 2017-02-12 (×4): qty 3

## 2017-02-12 MED ORDER — INSULIN NPH (HUMAN) (ISOPHANE) 100 UNIT/ML ~~LOC~~ SUSP
16.0000 [IU] | Freq: Every day | SUBCUTANEOUS | Status: DC
  Administered 2017-02-12: 16 [IU] via SUBCUTANEOUS

## 2017-02-12 MED ORDER — ALPRAZOLAM 0.25 MG PO TABS
0.2500 mg | ORAL_TABLET | Freq: Once | ORAL | Status: AC
  Administered 2017-02-12: 0.25 mg via ORAL
  Filled 2017-02-12: qty 1

## 2017-02-12 NOTE — Progress Notes (Deleted)
CM talked to patient's daughter via phone; patient is to return home with resumption of HHC services provided by [facility]. Adacia with [facility] called and updated and aware of discharge home soon. Abelino Derrick [facility] [phone]

## 2017-02-12 NOTE — Progress Notes (Signed)
Physical Therapy Treatment Patient Details Name: Tara Flores MRN: [id number] DOB: [birth year] Today's Date: 02/12/2017    History of Present Illness 81 y.o. female with medical history significant of anxiety, osteoarthritis, asthma, CAD, COPD, depression, type 2 diabetes, hyperlipidemia, hypertension, hypothyroidism, active smoker coming to the emergency department by her PCP due to uncontrolled diabetes, worsening lower extremity edema and increased shortness of breath    PT Comments    Patient required min A overall for bed mobility and OOB transfers including going to [facility] and recliner. Pt continues to need 24 hour assist at home and will continue to benefit from further skilled PT services to maximize independence and safety with mobility.    Follow Up Recommendations  Supervision/Assistance - 24 hour;Home health PT     Equipment Recommendations  None recommended by PT    Recommendations for Other Services       Precautions / Restrictions Precautions Precautions: Fall Restrictions Weight Bearing Restrictions: No    Mobility  Bed Mobility Overal bed mobility: Needs Assistance Bed Mobility: Supine to Sit     Supine to sit: Min assist     General bed mobility comments: assist to elevate trunk and scoot hips toward EOB   Transfers Overall transfer level: Needs assistance Equipment used: None;Rolling walker (2 wheeled) Transfers: Sit to/from [employer] Sit to Stand: Supervision Stand pivot transfers: Min assist       General transfer comment: assist for balance when pivoting from EOB to [facility] and BSC to recliner  Ambulation/Gait             General Gait Details: patient deferred at this time due to not having LLE shoe/boot   Stairs            Wheelchair Mobility    Modified Rankin (Stroke Patients Only)       Balance Overall balance assessment: Needs assistance   Sitting balance-Leahy Scale: Good       Standing  balance-Leahy Scale: Poor                              Cognition Arousal/Alertness: Awake/alert Behavior During Therapy: WFL for tasks assessed/performed Overall Cognitive Status: Within Functional Limits for tasks assessed                                        Exercises      General Comments General comments (skin integrity, edema, etc.): pt required total A for pericare      Pertinent Vitals/Pain Pain Assessment: No/denies pain    Home Living                      Prior Function            PT Goals (current goals can now be found in the care plan section) Acute Rehab PT Goals PT Goal Formulation: With patient Time For Goal Achievement: 02/24/17 Potential to Achieve Goals: Good Progress towards PT goals: Progressing toward goals    Frequency    Min 3X/week      PT Plan Current plan remains appropriate    Co-evaluation              AM-PAC PT "6 Clicks" Daily Activity  Outcome Measure  Difficulty turning over in bed (including adjusting bedclothes, sheets and blankets)?: A Little Difficulty moving from lying  on back to sitting on the side of the bed? : Unable Difficulty sitting down on and standing up from a chair with arms (e.g., wheelchair, bedside commode, etc,.)?: A Lot Help needed moving to and from a bed to chair (including a wheelchair)?: A Little Help needed walking in hospital room?: A Lot Help needed climbing 3-5 steps with a railing? : A Lot 6 Click Score: 13    End of Session Equipment Utilized During Treatment: Gait belt Activity Tolerance: Patient tolerated treatment well Patient left: in chair;with call bell/phone within reach Nurse Communication: Mobility status PT Visit Diagnosis: Difficulty in walking, not elsewhere classified (R26.2)     Time: 3710-6269 PT Time Calculation (min) (ACUTE ONLY): 21 min  Charges:  $Therapeutic Activity: 8-22 mins                    G Codes:       Erline Levine, PTA Pager: [phone]     Carolynne Edouard 02/12/2017, 11:48 AM

## 2017-02-12 NOTE — Progress Notes (Signed)
PROGRESS NOTE    MANIKA HAST  NWG:[id number] DOB: [birth year] DOA: 02/09/2017 PCP: Annita Brod, MD    Brief Narrative:  Patient is a 81 yo with history of anxiety, osteoarthritis, asthma, COPD witbuse, depression, type 2 diabetes, hypertension, hyperlipemia, hypothyroidism presented to the ED with acute CHF exacerbation.   Assessment & Plan:   Principal Problem:   Acute on chronic diastolic (congestive) heart failure (HCC) Active Problems:   Essential hypertension   Hypothyroidism   Coronary artery disease involving native coronary artery of native heart without angina pectoris   Hypokalemia   Atrial fibrillation with RVR (HCC)   COPD exacerbation (HCC)   Type 2 diabetes mellitus with hypoglycemia (HCC)   Tobacco use disorder   Pulmonary emphysema (HCC)   #1 acute on chronic diastolic heart failure Questionable etiology. Cardiac enzymes negative. Improving. Urine output of 2.050 L over the past 24 hours. 2-D echo with EF 60-65% with no wall motion abnormalities. Change IV Lasix to oral Lasix. Continue cardiac of Cardizem,Imdur, lisinopril. Outpatient follow up.  #2 A. Fib with RVR Continue Cardizem for rate control. Continue to Xopenex due to tachycardia. Continue Eliquis for anticoagulation.  #3 hypokalemia Due to diureses. Replete.   #4 hypothyroidism TSH slightly elevated at 5.494.continue current dose of Synthroid. Outpatient follow-up. Patient will need repeat thyroid function studies done in about 4-6 weeks.  #5 hypertension Blood pressure stable. Continue current regimen of lisinopril, Imdur, Cardizem, Lasix.  #6 type 2 diabetes mellitus Hemoglobin A1c was 9.0 11/19/2016.CBGs have ranged from 224-285. Change NPH to 16 units daily at bedtime and continue 25 units daily. Follow.  #7 tobacco use disorder Tobacco cessation. On nicotine patch.  #8 COPD Will add Pulmicort to scheduled nebulizers.   DVT prophylaxis: eliquis Code Status: DO NOT  RESUSCITATE Family Communication: updated patient. No family at bedside. Disposition Plan: likely to skilled nursing facility hopefully tomorrow.likely home with home health versus skilled nursing facility.   Consultants:   Palliative care: Dr. Linna Darner 02/11/2017  Procedures:   CXR 02/09/2017  2-D echo 02/12/2017  Antimicrobials:   Macrobid 02/10/2017>>>>02/15/2017   Subjective: Patient sitting up in chair. States shortness of breath improving. Denies chest pain. States that she needs her dentures changed.  Objective: Vitals:   02/12/17 0259 02/12/17 0652 02/12/17 0800 02/12/17 1117  BP:  119/81  131/78  Pulse:  (!) 103  (!) 105  Resp:  20  20  Temp:  97.9 F (36.6 C)  98 F (36.7 C)  TempSrc:  Oral  Oral  SpO2: 96% 96% 94% 95%  Weight:      Height:        Intake/Output Summary (Last 24 hours) at 02/12/17 1306 Last data filed at 02/12/17 1118  Gross per 24 hour  Intake              720 ml  Output             1220 ml  Net             -500 ml   Filed Weights   02/10/17 0515 02/11/17 0700 02/12/17 0218  Weight: 75.9 kg (167 lb 6.4 oz) 72.8 kg (160 lb 6.4 oz) 71.9 kg (158 lb 9.6 oz)    Examination:  General exam: NAD Respiratory system: minimal basilar crackles. Poor- Fair air movement. Cardiovascular system: rregularly irregular. No JVD. No murmurs rubs or gallops. Trace to 1+ bilateral lower extremity edema. Gastrointestinal system: Abdomen is soft, nontender, nondistended, positive bowel sounds. Central nervous system:  Alert and oriented. No focal neurological deficits. Extremities: Symmetric 5 x 5 power. Skin: Lower extremities with chronic venous stasis changes. Trace to 1+ edema. Dry skin. Psychiatry: Judgement and insight appear fair. Mood & affect appropriate.     Data Reviewed: I have personally reviewed following labs and imaging studies  CBC:  Recent Labs Lab 02/09/17 1307 02/11/17 0405  WBC 5.8 5.8  NEUTROABS 3.7  --   HGB 12.4 11.9*  HCT  38.5 37.5  MCV 92.3 94.0  PLT 303 308   Basic Metabolic Panel:  Recent Labs Lab 02/09/17 1307 02/09/17 2342 02/10/17 0558 02/11/17 0405 02/12/17 0622  NA 140  --  138 136 137  K 3.4*  --  3.8 3.3* 3.8  CL 105  --  102 99* 99*  CO2 28  --  28 31 31   GLUCOSE 50*  --  166* 261* 243*  BUN 19  --  15 16 20   CREATININE 0.61  --  0.54 0.59 0.67  CALCIUM 8.5*  --  8.3* 8.5* 8.6*  MG 1.9 2.0  --   --   --   PHOS 3.8  --   --   --   --    GFR: Estimated Creatinine Clearance: 51.7 mL/min (by C-G formula based on SCr of 0.67 mg/dL). Liver Function Tests:  Recent Labs Lab 02/09/17 1307  AST 19  ALT 17  ALKPHOS 145*  BILITOT 0.5  PROT 7.1  ALBUMIN 2.9*   No results for input(s): LIPASE, AMYLASE in the last 168 hours. No results for input(s): AMMONIA in the last 168 hours. Coagulation Profile: No results for input(s): INR, PROTIME in the last 168 hours. Cardiac Enzymes:  Recent Labs Lab 02/09/17 2342 02/10/17 0558  TROPONINI <0.03 <0.03   BNP (last 3 results) No results for input(s): PROBNP in the last 8760 hours. HbA1C: No results for input(s): HGBA1C in the last 72 hours. CBG:  Recent Labs Lab 02/11/17 1108 02/11/17 1720 02/11/17 2124 02/12/17 0655 02/12/17 1117  GLUCAP 162* 252* 285* 242* 224*   Lipid Profile: No results for input(s): CHOL, HDL, LDLCALC, TRIG, CHOLHDL, LDLDIRECT in the last 72 hours. Thyroid Function Tests:  Recent Labs  02/10/17 0643  TSH 5.494*   Anemia Panel: No results for input(s): VITAMINB12, FOLATE, FERRITIN, TIBC, IRON, RETICCTPCT in the last 72 hours. Sepsis Labs:  Recent Labs Lab 02/09/17 1318  LATICACIDVEN 1.11    No results found for this or any previous visit (from the past 240 hour(s)).       Radiology Studies: No results found.      Scheduled Meds: . apixaban  5 mg Oral BID  . busPIRone  5 mg Oral QHS  . diltiazem  120 mg Oral q1800  . feeding supplement (GLUCERNA SHAKE)  237 mL Oral TID WC  .  furosemide  40 mg Intravenous BID  . insulin aspart  0-5 Units Subcutaneous QHS  . insulin aspart  0-9 Units Subcutaneous TID WC  . insulin NPH Human  16 Units Subcutaneous QHS  . insulin NPH Human  25 Units Subcutaneous QAC breakfast  . ipratropium  0.5 mg Nebulization Q6H  . isosorbide mononitrate  15 mg Oral Daily  . levalbuterol  0.63 mg Nebulization Q6H  . levothyroxine  150 mcg Oral QAC breakfast  . lisinopril  2.5 mg Oral Daily  . nicotine  14 mg Transdermal Daily  . nitrofurantoin  100 mg Oral BID   Continuous Infusions:   LOS: 2 days  Time spent: 35 mins    Jaidon Ellery, MD [facility] Pager [phone]  If 7PM-7AM, please contact night-coverage [email or web address] Password [email or web address] 02/12/2017, 1:06 PM

## 2017-02-12 NOTE — Clinical Social Work Note (Addendum)
CSW received call from admissions coordinator at [facility]. She has spoken with the patient's insurance company and patient is on day 24 of her SNF days, therefore she is in her copay days. This will cost her $160 per day if she continues to want SNF. Her days do not reset until October. CSW will discuss with patient and her daughter this morning.  Charlynn Court, CSW [phone]  11:14 am CSW notified patient of copay and she stated she is unable to afford it. She asked that CSW notify her daughter as well. CSW left her a voicemail. Patient stated she wishes her son would let her stay with him until she is able to move in with her grandson. Patient's voiced appreciation for her children and how well they have cared for her.  Charlynn Court, CSW [phone]  11:21 am Patient's daughter called back and CSW notified her of information. RNCM spoke with daughter regarding home health plan.  CSW signing off. Consult again if any other social work needs arise.  Charlynn Court, CSW [phone]

## 2017-02-12 NOTE — Clinical Social Work Note (Addendum)
CSW called insurance company at daughter's request. Their information was wrong and patient's skilled days have been reset to 0. Patient and her daughter notified. Patient wants to know her preference SNFs smoking policies before she makes official decision. Patient's daughter is agreeable to whichever facility she chooses.  Charlynn Court, CSW [phone]  1:29 pm [facility] [place] do not allow smoking but [facility] does. Patient is agreeable to discharging to this facility. CSW paged MD to make him aware that patient can go to SNF without copay and they can take her today or tomorrow.  Charlynn Court, CSW [phone]

## 2017-02-12 NOTE — Progress Notes (Signed)
  Echocardiogram 2D Echocardiogram has been performed.  Tara Flores 02/12/2017, 11:26 AM

## 2017-02-13 DIAGNOSIS — J441 Chronic obstructive pulmonary disease with (acute) exacerbation: Secondary | ICD-10-CM

## 2017-02-13 LAB — BASIC METABOLIC PANEL
ANION GAP: 9 (ref 5–15)
BUN: 21 mg/dL — ABNORMAL HIGH (ref 6–20)
CALCIUM: 9 mg/dL (ref 8.9–10.3)
CHLORIDE: 95 mmol/L — AB (ref 101–111)
CO2: 33 mmol/L — ABNORMAL HIGH (ref 22–32)
CREATININE: 0.69 mg/dL (ref 0.44–1.00)
GFR calc non Af Amer: >60 mL/min (ref >60)
Glucose, Bld: 244 mg/dL — ABNORMAL HIGH (ref 65–99)
Potassium: 3.8 mmol/L (ref 3.5–5.1)
SODIUM: 137 mmol/L (ref 135–145)

## 2017-02-13 LAB — GLUCOSE, CAPILLARY
GLUCOSE-CAPILLARY: 214 mg/dL — AB (ref 65–99)
Glucose-Capillary: 300 mg/dL — ABNORMAL HIGH (ref 65–99)
Glucose-Capillary: 283 mg/dL — ABNORMAL HIGH (ref 65–99)
Glucose-Capillary: 460 mg/dL — ABNORMAL HIGH (ref 65–99)

## 2017-02-13 MED ORDER — INSULIN ASPART 100 UNIT/ML ~~LOC~~ SOLN
12.0000 [IU] | Freq: Once | SUBCUTANEOUS | Status: AC
  Administered 2017-02-13: 12 [IU] via SUBCUTANEOUS

## 2017-02-13 MED ORDER — METHYLPREDNISOLONE SODIUM SUCC 125 MG IJ SOLR
60.0000 mg | Freq: Once | INTRAMUSCULAR | Status: AC
  Administered 2017-02-13: 60 mg via INTRAVENOUS
  Filled 2017-02-13: qty 2

## 2017-02-13 MED ORDER — DILTIAZEM HCL 30 MG PO TABS
30.0000 mg | ORAL_TABLET | Freq: Once | ORAL | Status: AC
  Administered 2017-02-13: 30 mg via ORAL
  Filled 2017-02-13: qty 1

## 2017-02-13 MED ORDER — HYDROCERIN EX CREA
TOPICAL_CREAM | Freq: Two times a day (BID) | CUTANEOUS | Status: DC
  Administered 2017-02-13: 1 via TOPICAL
  Administered 2017-02-14 (×2): via TOPICAL
  Administered 2017-02-15: 1 via TOPICAL
  Administered 2017-02-15 – 2017-02-16 (×2): via TOPICAL
  Filled 2017-02-13: qty 113

## 2017-02-13 MED ORDER — DILTIAZEM HCL ER COATED BEADS 180 MG PO CP24
180.0000 mg | ORAL_CAPSULE | Freq: Every day | ORAL | Status: DC
  Administered 2017-02-13 – 2017-02-14 (×2): 180 mg via ORAL
  Filled 2017-02-13 (×2): qty 1

## 2017-02-13 MED ORDER — INSULIN NPH (HUMAN) (ISOPHANE) 100 UNIT/ML ~~LOC~~ SUSP
20.0000 [IU] | Freq: Every day | SUBCUTANEOUS | Status: DC
  Administered 2017-02-13 – 2017-02-14 (×2): 20 [IU] via SUBCUTANEOUS

## 2017-02-13 MED ORDER — ALPRAZOLAM 0.25 MG PO TABS
0.2500 mg | ORAL_TABLET | Freq: Two times a day (BID) | ORAL | Status: DC | PRN
  Administered 2017-02-13 – 2017-02-16 (×4): 0.25 mg via ORAL
  Filled 2017-02-13 (×4): qty 1

## 2017-02-13 MED ORDER — INSULIN NPH (HUMAN) (ISOPHANE) 100 UNIT/ML ~~LOC~~ SUSP
28.0000 [IU] | Freq: Every day | SUBCUTANEOUS | Status: DC
  Administered 2017-02-14: 28 [IU] via SUBCUTANEOUS
  Filled 2017-02-13: qty 10

## 2017-02-13 NOTE — Clinical Social Work Note (Signed)
Per MD, patient will hopefully discharge tomorrow. Admissions coordinator at [facility] notified.  Charlynn Court, CSW [phone]

## 2017-02-13 NOTE — Progress Notes (Signed)
Physical Therapy Treatment Patient Details Name: Tara Flores MRN: [id number] DOB: [birth year] Today's Date: 02/13/2017    History of Present Illness 81 y.o. female with medical history significant of anxiety, osteoarthritis, asthma, CAD, COPD, depression, type 2 diabetes, hyperlipidemia, hypertension, hypothyroidism, active smoker coming to the emergency department by her PCP due to uncontrolled diabetes, worsening lower extremity edema and increased shortness of breath    PT Comments    Pt still does not have her walking boot/shoe for her L LE and reported that she is not allowed to weight bear without it. Attempted standing activity, but pt unable to maintain NWB L LE. Pt participated in bilateral LE therapeutic exercises (see below). Pt would continue to benefit from skilled physical therapy services at this time while admitted and after d/c to address the below listed limitations in order to improve overall safety and independence with functional mobility.    Follow Up Recommendations  Supervision/Assistance - 24 hour;Home health PT     Equipment Recommendations  None recommended by PT    Recommendations for Other Services       Precautions / Restrictions Precautions Precautions: Fall Restrictions Weight Bearing Restrictions: No    Mobility  Bed Mobility               General bed mobility comments: pt OOB in recliner chair upon arrival  Transfers Overall transfer level: Needs assistance Equipment used: Rolling walker (2 wheeled) Transfers: Sit to/from Stand Sit to Stand: Min guard         General transfer comment: pt unable to maintain NWB L LE (pt does not have her walking boot/shoe in her room and stated she is not allowed to weight bear without it)  Ambulation/Gait             General Gait Details: deferred at this time due to not having LLE shoe/boot and pt unable to maintain NWB L LE in standing with RW   Stairs            Wheelchair  Mobility    Modified Rankin (Stroke Patients Only)       Balance Overall balance assessment: Needs assistance   Sitting balance-Leahy Scale: Fair     Standing balance support: During functional activity;Bilateral upper extremity supported Standing balance-Leahy Scale: Poor Standing balance comment: pt reliant on bilateral UEs on RW                            Cognition Arousal/Alertness: Lethargic;Suspect due to medications Behavior During Therapy: [facility] for tasks assessed/performed Overall Cognitive Status: Within Functional Limits for tasks assessed                                        Exercises General Exercises - Lower Extremity Quad Sets: AROM;Left;10 reps;Seated Long Arc Quad: AROM;Strengthening;Both;10 reps;Seated Hip ABduction/ADduction: AROM;Strengthening;Left;10 reps;Seated Hip Flexion/Marching: AROM;Both;10 reps;Seated    General Comments        Pertinent Vitals/Pain Pain Assessment: No/denies pain    Home Living                      Prior Function            PT Goals (current goals can now be found in the care plan section) Acute Rehab PT Goals PT Goal Formulation: With patient Time For Goal Achievement: 02/24/17 Potential to  Achieve Goals: Good Progress towards PT goals: Progressing toward goals    Frequency    Min 3X/week      PT Plan Current plan remains appropriate    Co-evaluation              AM-PAC PT "6 Clicks" Daily Activity  Outcome Measure  Difficulty turning over in bed (including adjusting bedclothes, sheets and blankets)?: A Little Difficulty moving from lying on back to sitting on the side of the bed? : A Little Difficulty sitting down on and standing up from a chair with arms (e.g., wheelchair, bedside commode, etc,.)?: Unable Help needed moving to and from a bed to chair (including a wheelchair)?: A Lot Help needed walking in hospital room?: A Lot Help needed climbing 3-5  steps with a railing? : A Lot 6 Click Score: 13    End of Session Equipment Utilized During Treatment: Gait belt Activity Tolerance: Patient tolerated treatment well Patient left: in chair;with call bell/phone within reach;with chair alarm set Nurse Communication: Mobility status PT Visit Diagnosis: Difficulty in walking, not elsewhere classified (R26.2)     Time: 1308-6578 PT Time Calculation (min) (ACUTE ONLY): 15 min  Charges:  $Therapeutic Exercise: 8-22 mins                    G Codes:       [place], PT, DPT [phone]    Alessandra Bevels Anyi Fels 02/13/2017, 1:22 PM

## 2017-02-13 NOTE — Progress Notes (Signed)
PROGRESS NOTE    Tara Flores  ZOX:[id number] DOB: 1936/05/12 DOA: 02/09/2017 PCP: Annita Brod, MD    Brief Narrative:  Patient is a 81 yo with history of anxiety, osteoarthritis, asthma, COPD witbuse, depression, type 2 diabetes, hypertension, hyperlipemia, hypothyroidism presented to the ED with acute CHF exacerbation.   Assessment & Plan:   Principal Problem:   Acute on chronic diastolic (congestive) heart failure (HCC) Active Problems:   Essential hypertension   Hypothyroidism   Coronary artery disease involving native coronary artery of native heart without angina pectoris   Hypokalemia   Atrial fibrillation with RVR (HCC)   COPD exacerbation (HCC)   Type 2 diabetes mellitus with hypoglycemia (HCC)   Tobacco use disorder   Pulmonary emphysema (HCC)   #1 acute on chronic diastolic heart failure Questionable etiology. Cardiac enzymes negative. Clinical improvement. Urine output of 2.54 L over the past 24 hours. 2-D echo with EF 60-65% with no wall motion abnormalities. Changed IV Lasix to oral Lasix. Continue Cardizem,Imdur, lisinopril. Outpatient follow up.  #2 A. Fib with RVR Patient now with heart rate in the 120s. Will increase Cardizem 180 mg. Patient already received 120 mg of Cardizem this morning and a such were given a extra 30 mg by mouth 1 now. Continue to Xopenex due to tachycardia. Continue Eliquis for anticoagulation.  #3 hypokalemia Repleted.   #4 hypothyroidism TSH slightly elevated at 5.494.continue current dose of Synthroid. Outpatient follow-up. Patient will need repeat thyroid function studies done in about 4-6 weeks.  #5 hypertension Continue current regimen of lisinopril, Imdur, Cardizem, Lasix.  #6 type 2 diabetes mellitus Hemoglobin A1c was 9.0 11/19/2016.CBGs have ranged from 214-300. Change NPH to 20 units daily at bedtime and continue 28 units daily. Follow.  #7 tobacco use disorder Tobacco cessation. Continue nicotine patch.  #8  COPD Continue current nebulizer treatments and Pulmicort. Will give a dose of IV Solu-Medrol 1 as patient with some wheezing noted on examination.   DVT prophylaxis: eliquis Code Status: DO NOT RESUSCITATE Family Communication: updated patient. No family at bedside. Disposition Plan: likely to skilled nursing facility once heart rate is better controlled.    Consultants:   Palliative care: Dr. Linna Darner 02/11/2017  Procedures:   CXR 02/09/2017  2-D echo 02/12/2017  Antimicrobials:   Macrobid 02/10/2017>>>>02/15/2017   Subjective: Patient denies chest pain. No shortness of breath. Patient noted on telemetry to be in A. fib with heart rates in the 120s.   Objective: Vitals:   02/13/17 0034 02/13/17 0458 02/13/17 1052 02/13/17 1054  BP:  131/85    Pulse: (!) 105 (!) 112    Resp:  18    Temp:  98 F (36.7 C)    TempSrc:  Oral    SpO2:  98% 98% 98%  Weight:  71.1 kg (156 lb 12.8 oz)    Height:        Intake/Output Summary (Last 24 hours) at 02/13/17 1119 Last data filed at 02/13/17 0933  Gross per 24 hour  Intake             1294 ml  Output             2496 ml  Net            -1202 ml   Filed Weights   02/11/17 0700 02/12/17 0218 02/13/17 0458  Weight: 72.8 kg (160 lb 6.4 oz) 71.9 kg (158 lb 9.6 oz) 71.1 kg (156 lb 12.8 oz)    Examination:  General exam: NAD  Respiratory system: Poor to fair air movement. Minimal to mild Expiratory wheezing.  Cardiovascular system: rregularly irregular. No JVD. No murmurs rubs or gallops. Trace to 1+ bilateral lower extremity edema. Gastrointestinal system: Abdomen is nontender, nondistended, positive bowel sounds. Central nervous system: Alert and oriented. No focal neurological deficits. Extremities: Symmetric 5 x 5 power. Skin: Lower extremities with chronic venous stasis changes. Trace edema. Dry skin. Psychiatry: Judgement and insight appear fair. Mood & affect appropriate.     Data Reviewed: I have personally reviewed  following labs and imaging studies  CBC:  Recent Labs Lab 02/09/17 1307 02/11/17 0405  WBC 5.8 5.8  NEUTROABS 3.7  --   HGB 12.4 11.9*  HCT 38.5 37.5  MCV 92.3 94.0  PLT 303 308   Basic Metabolic Panel:  Recent Labs Lab 02/09/17 1307 02/09/17 2342 02/10/17 0558 02/11/17 0405 02/12/17 0622 02/13/17 0353  NA 140  --  138 136 137 137  K 3.4*  --  3.8 3.3* 3.8 3.8  CL 105  --  102 99* 99* 95*  CO2 28  --  28 31 31  33*  GLUCOSE 50*  --  166* 261* 243* 244*  BUN 19  --  15 16 20  21*  CREATININE 0.61  --  0.54 0.59 0.67 0.69  CALCIUM 8.5*  --  8.3* 8.5* 8.6* 9.0  MG 1.9 2.0  --   --   --   --   PHOS 3.8  --   --   --   --   --    GFR: Estimated Creatinine Clearance: 51.4 mL/min (by C-G formula based on SCr of 0.69 mg/dL). Liver Function Tests:  Recent Labs Lab 02/09/17 1307  AST 19  ALT 17  ALKPHOS 145*  BILITOT 0.5  PROT 7.1  ALBUMIN 2.9*   No results for input(s): LIPASE, AMYLASE in the last 168 hours. No results for input(s): AMMONIA in the last 168 hours. Coagulation Profile: No results for input(s): INR, PROTIME in the last 168 hours. Cardiac Enzymes:  Recent Labs Lab 02/09/17 2342 02/10/17 0558  TROPONINI <0.03 <0.03   BNP (last 3 results) No results for input(s): PROBNP in the last 8760 hours. HbA1C: No results for input(s): HGBA1C in the last 72 hours. CBG:  Recent Labs Lab 02/12/17 0655 02/12/17 1117 02/12/17 1640 02/12/17 2107 02/13/17 0744  GLUCAP 242* 224* 209* 255* 214*   Lipid Profile: No results for input(s): CHOL, HDL, LDLCALC, TRIG, CHOLHDL, LDLDIRECT in the last 72 hours. Thyroid Function Tests: No results for input(s): TSH, T4TOTAL, FREET4, T3FREE, THYROIDAB in the last 72 hours. Anemia Panel: No results for input(s): VITAMINB12, FOLATE, FERRITIN, TIBC, IRON, RETICCTPCT in the last 72 hours. Sepsis Labs:  Recent Labs Lab 02/09/17 1318  LATICACIDVEN 1.11    No results found for this or any previous visit (from the  past 240 hour(s)).       Radiology Studies: No results found.      Scheduled Meds: . apixaban  5 mg Oral BID  . budesonide (PULMICORT) nebulizer solution  0.5 mg Nebulization BID  . busPIRone  5 mg Oral QHS  . diltiazem  180 mg Oral q1800  . diltiazem  30 mg Oral Once  . feeding supplement (GLUCERNA SHAKE)  237 mL Oral TID WC  . furosemide  40 mg Oral BID  . insulin aspart  0-5 Units Subcutaneous QHS  . insulin aspart  0-9 Units Subcutaneous TID WC  . insulin NPH Human  16 Units Subcutaneous QHS  .  insulin NPH Human  25 Units Subcutaneous QAC breakfast  . ipratropium  0.5 mg Nebulization TID  . isosorbide mononitrate  15 mg Oral Daily  . levalbuterol  0.63 mg Nebulization TID  . levothyroxine  150 mcg Oral QAC breakfast  . lisinopril  2.5 mg Oral Daily  . nicotine  14 mg Transdermal Daily  . nitrofurantoin  100 mg Oral BID   Continuous Infusions:   LOS: 3 days    Time spent: 35 mins    THOMPSON,DANIEL, MD [facility] Pager [phone]  If 7PM-7AM, please contact night-coverage [email or web address] Password [email or web address] 02/13/2017, 11:19 AM

## 2017-02-13 NOTE — Progress Notes (Signed)
Patient CBG 460. Paged MD on call  [facility] for [facility] for notification and furder instructions. Awaiting on possible orders.   Shandale Malak, RN

## 2017-02-13 NOTE — Progress Notes (Signed)
Novolog 12 units ordered per MD Opyd in addition to scheduled NPH.  Will continue to monitor.  Jameel Quant, RN

## 2017-02-14 LAB — CBC
HEMATOCRIT: 37.8 % (ref 36.0–46.0)
Hemoglobin: 12.3 g/dL (ref 12.0–15.0)
MCH: 30.3 pg (ref 26.0–34.0)
MCHC: 32.5 g/dL (ref 30.0–36.0)
MCV: 93.1 fL (ref 78.0–100.0)
Platelets: 303 10*3/uL (ref 150–400)
RBC: 4.06 MIL/uL (ref 3.87–5.11)
RDW: 13.8 % (ref 11.5–15.5)
WBC: 6.3 10*3/uL (ref 4.0–10.5)

## 2017-02-14 LAB — BASIC METABOLIC PANEL
Anion gap: 8 (ref 5–15)
BUN: 23 mg/dL — AB (ref 6–20)
CO2: 31 mmol/L (ref 22–32)
CREATININE: 0.69 mg/dL (ref 0.44–1.00)
Calcium: 8.9 mg/dL (ref 8.9–10.3)
Chloride: 93 mmol/L — ABNORMAL LOW (ref 101–111)
GFR calc Af Amer: >60 mL/min (ref >60)
GLUCOSE: 350 mg/dL — AB (ref 65–99)
POTASSIUM: 4 mmol/L (ref 3.5–5.1)
Sodium: 132 mmol/L — ABNORMAL LOW (ref 135–145)

## 2017-02-14 LAB — GLUCOSE, CAPILLARY
GLUCOSE-CAPILLARY: 331 mg/dL — AB (ref 65–99)
Glucose-Capillary: 368 mg/dL — ABNORMAL HIGH (ref 65–99)
Glucose-Capillary: 310 mg/dL — ABNORMAL HIGH (ref 65–99)
Glucose-Capillary: 177 mg/dL — ABNORMAL HIGH (ref 65–99)
Glucose-Capillary: 244 mg/dL — ABNORMAL HIGH (ref 65–99)

## 2017-02-14 MED ORDER — LEVALBUTEROL HCL 0.63 MG/3ML IN NEBU
0.6300 mg | INHALATION_SOLUTION | Freq: Two times a day (BID) | RESPIRATORY_TRACT | Status: DC
  Administered 2017-02-14 – 2017-02-16 (×4): 0.63 mg via RESPIRATORY_TRACT
  Filled 2017-02-14 (×4): qty 3

## 2017-02-14 MED ORDER — IPRATROPIUM BROMIDE 0.02 % IN SOLN
0.5000 mg | Freq: Two times a day (BID) | RESPIRATORY_TRACT | Status: DC
  Administered 2017-02-14 – 2017-02-16 (×4): 0.5 mg via RESPIRATORY_TRACT
  Filled 2017-02-14 (×4): qty 2.5

## 2017-02-14 MED ORDER — FUROSEMIDE 10 MG/ML IJ SOLN
60.0000 mg | Freq: Two times a day (BID) | INTRAMUSCULAR | Status: DC
  Administered 2017-02-14 – 2017-02-16 (×4): 60 mg via INTRAVENOUS
  Filled 2017-02-14 (×5): qty 6

## 2017-02-14 MED ORDER — INSULIN NPH (HUMAN) (ISOPHANE) 100 UNIT/ML ~~LOC~~ SUSP
4.0000 [IU] | Freq: Once | SUBCUTANEOUS | Status: AC
  Administered 2017-02-14: 4 [IU] via SUBCUTANEOUS
  Filled 2017-02-14: qty 10

## 2017-02-14 MED ORDER — FUROSEMIDE 10 MG/ML IJ SOLN: 60.0000 mg | Freq: Two times a day (BID) | INTRAMUSCULAR | Status: DC

## 2017-02-14 MED ORDER — INSULIN NPH (HUMAN) (ISOPHANE) 100 UNIT/ML ~~LOC~~ SUSP
30.0000 [IU] | Freq: Every day | SUBCUTANEOUS | Status: DC
  Administered 2017-02-15: 30 [IU] via SUBCUTANEOUS
  Filled 2017-02-14: qty 10

## 2017-02-14 NOTE — Progress Notes (Signed)
Patient blood sugar is coming down, 368. Will continue to monitor.  Evadna Donaghy, RN

## 2017-02-14 NOTE — Plan of Care (Signed)
Problem: Health Behavior/Discharge Planning: Goal: Ability to manage health-related needs will improve Outcome: Progressing Patient will be going to SNF at discharge   Problem: Tissue Perfusion: Goal: Risk factors for ineffective tissue perfusion will decrease Outcome: Progressing Maintains oxygen saturation in the 90's on room air   Problem: Fluid Volume: Goal: Ability to maintain a balanced intake and output will improve Outcome: Progressing Continues to diurese

## 2017-02-14 NOTE — Progress Notes (Signed)
Patient without complaint during 7 a to 7 p shift, heart rate continues to fluctuate, in low 100's for most of shift.  See prior note regarding IV Metoprolol when heart rate increased to 120's and 130's, down to 110's after administration of Metoprolol.  Patient sat up in chair entire shift, purewick placed after IV lasix given at patients request.

## 2017-02-14 NOTE — Progress Notes (Signed)
HR elevated in 120's and low 130's in spite of Cardizem.  IV Metoprolol given as per prn order for heart rate greater than 105.  Will continue to monitor.

## 2017-02-14 NOTE — Progress Notes (Addendum)
PROGRESS NOTE    Tara Flores  ZOX:[id number] DOB: 07-04-1935 DOA: 02/09/2017 PCP: Annita Brod, MD    Brief Narrative:  Patient is a 81 yo with history of anxiety, osteoarthritis, asthma, COPD witbuse, depression, type 2 diabetes, hypertension, hyperlipemia, hypothyroidism presented to the ED with acute CHF exacerbation.   Assessment & Plan:   Principal Problem:   Acute on chronic diastolic (congestive) heart failure (HCC) Active Problems:   Essential hypertension   Hypothyroidism   Coronary artery disease involving native coronary artery of native heart without angina pectoris   Hypokalemia   Atrial fibrillation with RVR (HCC)   COPD exacerbation (HCC)   Type 2 diabetes mellitus with hypoglycemia (HCC)   Tobacco use disorder   Pulmonary emphysema (HCC)   #1 acute on chronic diastolic heart failure Questionable etiology. Cardiac enzymes negative. Patient was transitioned to oral diuretics however patient now with worsening lower extremity edema. Patient with a urine output of 2.150 L over the past 24 hours.  Urine output of 2.54 L over the past 24 hours. 2-D echo with EF 60-65% with no wall motion abnormalities. Changed oral Lasix to IVLasix. Continue Cardizem,Imdur, lisinopril. Outpatient follow up.  #2 A. Fib with RVR Patient now with heart rate in the 90s to low 100s. Heart rate improved with increased dose of Cardizem 280 mg daily. Continue Xopenex. Continue Eliquis for anticoagulation.  #3 hypokalemia Repleted.   #4 hypothyroidism TSH slightly elevated at 5.494.continue current dose of Synthroid. Outpatient follow-up. Patient will need repeat thyroid function studies done in about 4-6 weeks.  #5 hypertension Continue current regimen of lisinopril, Imdur, Cardizem, Lasix.  #6 type 2 diabetes mellitus Hemoglobin A1c was 9.0 11/19/2016.CBGs have ranged from 310-460 secondary to IV Solu-Medrol given yesterday. Change NPH to 20 units daily at bedtime and 30 units every  morning. Follow.  #7 tobacco use disorder Tobacco cessation. Patient on a nicotine patch.   #8 COPD Continue current nebulizer treatments and Pulmicort. s/p a dose of IV Solu-Medrol 1 02/13/2017, as patient with some wheezing noted on examination.   DVT prophylaxis: eliquis Code Status: DO NOT RESUSCITATE Family Communication: updated patient. No family at bedside. Disposition Plan: likely to skilled nursing facility once heart rate is better controlled and volume status improved.    Consultants:   Palliative care: Dr. Linna Darner 02/11/2017  Procedures:   CXR 02/09/2017  2-D echo 02/12/2017  Antimicrobials:   Macrobid 02/10/2017>>>>02/15/2017   Subjective: Patient sleeping however easily arousable. Patient denies shortness of breath. Patient denies chest pain. A. fib with improved heart rates.  Objective: Vitals:   02/14/17 0612 02/14/17 0642 02/14/17 0720 02/14/17 0918  BP: 127/86   (!) 143/108  Pulse: 65   (!) 103  Resp: 18     Temp: 98 F (36.7 C)     TempSrc: Oral     SpO2: 98%  92% 95%  Weight:  71.2 kg (157 lb)    Height:        Intake/Output Summary (Last 24 hours) at 02/14/17 1236 Last data filed at 02/14/17 1045  Gross per 24 hour  Intake              840 ml  Output             2351 ml  Net            -1511 ml   Filed Weights   02/12/17 0218 02/13/17 0458 02/14/17 0642  Weight: 71.9 kg (158 lb 9.6 oz) 71.1 kg (156 lb  12.8 oz) 71.2 kg (157 lb)    Examination:  General exam: NAD Respiratory system: Fair air movement. Minimal Expiratory wheezing.  Cardiovascular system: rregularly irregular. No JVD. No murmurs rubs or gallops. 2- 3+ bilateral lower extremity edema.  Gastrointestinal system: Abdomen is nontender, nondistended, positive bowel sounds. Central nervous system: Sleeping however easily arousable. Oriented. No focal neurological deficits. Extremities: Symmetric 5 x 5 power. Skin: Lower extremities with chronic venous stasis changes. 2-3 + BLE  edema. Dry skin. Psychiatry: Judgement and insight appear fair. Mood & affect appropriate.     Data Reviewed: I have personally reviewed following labs and imaging studies  CBC:  Recent Labs Lab 02/09/17 1307 02/11/17 0405 02/14/17 0339  WBC 5.8 5.8 6.3  NEUTROABS 3.7  --   --   HGB 12.4 11.9* 12.3  HCT 38.5 37.5 37.8  MCV 92.3 94.0 93.1  PLT 303 308 303   Basic Metabolic Panel:  Recent Labs Lab 02/09/17 1307 02/09/17 2342 02/10/17 0558 02/11/17 0405 02/12/17 0622 02/13/17 0353 02/14/17 0339  NA 140  --  138 136 137 137 132*  K 3.4*  --  3.8 3.3* 3.8 3.8 4.0  CL 105  --  102 99* 99* 95* 93*  CO2 28  --  28 31 31  33* 31  GLUCOSE 50*  --  166* 261* 243* 244* 350*  BUN 19  --  15 16 20  21* 23*  CREATININE 0.61  --  0.54 0.59 0.67 0.69 0.69  CALCIUM 8.5*  --  8.3* 8.5* 8.6* 9.0 8.9  MG 1.9 2.0  --   --   --   --   --   PHOS 3.8  --   --   --   --   --   --    GFR: Estimated Creatinine Clearance: 51.5 mL/min (by C-G formula based on SCr of 0.69 mg/dL). Liver Function Tests:  Recent Labs Lab 02/09/17 1307  AST 19  ALT 17  ALKPHOS 145*  BILITOT 0.5  PROT 7.1  ALBUMIN 2.9*   No results for input(s): LIPASE, AMYLASE in the last 168 hours. No results for input(s): AMMONIA in the last 168 hours. Coagulation Profile: No results for input(s): INR, PROTIME in the last 168 hours. Cardiac Enzymes:  Recent Labs Lab 02/09/17 2342 02/10/17 0558  TROPONINI <0.03 <0.03   BNP (last 3 results) No results for input(s): PROBNP in the last 8760 hours. HbA1C: No results for input(s): HGBA1C in the last 72 hours. CBG:  Recent Labs Lab 02/13/17 1633 02/13/17 2105 02/14/17 0236 02/14/17 0724 02/14/17 1134  GLUCAP 283* 460* 368* 310* 331*   Lipid Profile: No results for input(s): CHOL, HDL, LDLCALC, TRIG, CHOLHDL, LDLDIRECT in the last 72 hours. Thyroid Function Tests: No results for input(s): TSH, T4TOTAL, FREET4, T3FREE, THYROIDAB in the last 72  hours. Anemia Panel: No results for input(s): VITAMINB12, FOLATE, FERRITIN, TIBC, IRON, RETICCTPCT in the last 72 hours. Sepsis Labs:  Recent Labs Lab 02/09/17 1318  LATICACIDVEN 1.11    No results found for this or any previous visit (from the past 240 hour(s)).       Radiology Studies: No results found.      Scheduled Meds: . apixaban  5 mg Oral BID  . budesonide (PULMICORT) nebulizer solution  0.5 mg Nebulization BID  . busPIRone  5 mg Oral QHS  . diltiazem  180 mg Oral q1800  . feeding supplement (GLUCERNA SHAKE)  237 mL Oral TID WC  . furosemide  40 mg  Oral BID  . hydrocerin   Topical BID  . insulin aspart  0-5 Units Subcutaneous QHS  . insulin aspart  0-9 Units Subcutaneous TID WC  . insulin NPH Human  20 Units Subcutaneous QHS  . [START ON 02/15/2017] insulin NPH Human  30 Units Subcutaneous QAC breakfast  . ipratropium  0.5 mg Nebulization BID  . isosorbide mononitrate  15 mg Oral Daily  . levalbuterol  0.63 mg Nebulization BID  . levothyroxine  150 mcg Oral QAC breakfast  . lisinopril  2.5 mg Oral Daily  . nicotine  14 mg Transdermal Daily  . nitrofurantoin  100 mg Oral BID   Continuous Infusions:   LOS: 4 days    Time spent: 40 mins    Cruzito Standre, MD [facility] Pager [phone]  If 7PM-7AM, please contact night-coverage [email or web address] Password [facility] 02/14/2017, 12:36 PM

## 2017-02-15 DIAGNOSIS — I4891 Unspecified atrial fibrillation: Secondary | ICD-10-CM

## 2017-02-15 DIAGNOSIS — I5033 Acute on chronic diastolic (congestive) heart failure: Secondary | ICD-10-CM

## 2017-02-15 DIAGNOSIS — E876 Hypokalemia: Secondary | ICD-10-CM

## 2017-02-15 DIAGNOSIS — I2510 Atherosclerotic heart disease of native coronary artery without angina pectoris: Secondary | ICD-10-CM

## 2017-02-15 DIAGNOSIS — E785 Hyperlipidemia, unspecified: Secondary | ICD-10-CM

## 2017-02-15 LAB — CBC WITH DIFFERENTIAL/PLATELET
BASOS ABS: 0.1 10*3/uL (ref 0.0–0.1)
BASOS PCT: 1 %
EOS PCT: 10 %
Eosinophils Absolute: 0.7 10*3/uL (ref 0.0–0.7)
HEMATOCRIT: 42.2 % (ref 36.0–46.0)
Hemoglobin: 13.3 g/dL (ref 12.0–15.0)
LYMPHS PCT: 19 %
Lymphs Abs: 1.4 10*3/uL (ref 0.7–4.0)
MCH: 29.8 pg (ref 26.0–34.0)
MCHC: 31.5 g/dL (ref 30.0–36.0)
MCV: 94.4 fL (ref 78.0–100.0)
MONO ABS: 0.5 10*3/uL (ref 0.1–1.0)
Monocytes Relative: 7 %
Neutro Abs: 4.5 10*3/uL (ref 1.7–7.7)
Neutrophils Relative %: 63 %
Platelets: 300 10*3/uL (ref 150–400)
RBC: 4.47 MIL/uL (ref 3.87–5.11)
RDW: 13.7 % (ref 11.5–15.5)
WBC: 7.1 10*3/uL (ref 4.0–10.5)

## 2017-02-15 LAB — BASIC METABOLIC PANEL
ANION GAP: 7 (ref 5–15)
Anion gap: 7 (ref 5–15)
BUN: 24 mg/dL — ABNORMAL HIGH (ref 6–20)
BUN: 24 mg/dL — AB (ref 6–20)
CALCIUM: 8.7 mg/dL — AB (ref 8.9–10.3)
CALCIUM: 8.4 mg/dL — AB (ref 8.9–10.3)
CO2: 35 mmol/L — ABNORMAL HIGH (ref 22–32)
CO2: 36 mmol/L — ABNORMAL HIGH (ref 22–32)
CREATININE: 0.91 mg/dL (ref 0.44–1.00)
Chloride: 94 mmol/L — ABNORMAL LOW (ref 101–111)
Chloride: 89 mmol/L — ABNORMAL LOW (ref 101–111)
Creatinine, Ser: 0.7 mg/dL (ref 0.44–1.00)
GFR calc Af Amer: >60 mL/min (ref >60)
GFR, EST NON AFRICAN AMERICAN: 58 mL/min — AB (ref >60)
GLUCOSE: 388 mg/dL — AB (ref 65–99)
Glucose, Bld: 105 mg/dL — ABNORMAL HIGH (ref 65–99)
Potassium: 3 mmol/L — ABNORMAL LOW (ref 3.5–5.1)
Potassium: 4.4 mmol/L (ref 3.5–5.1)
Sodium: 136 mmol/L (ref 135–145)
Sodium: 132 mmol/L — ABNORMAL LOW (ref 135–145)

## 2017-02-15 LAB — GLUCOSE, CAPILLARY
GLUCOSE-CAPILLARY: 53 mg/dL — AB (ref 65–99)
GLUCOSE-CAPILLARY: 100 mg/dL — AB (ref 65–99)
GLUCOSE-CAPILLARY: 287 mg/dL — AB (ref 65–99)
Glucose-Capillary: 68 mg/dL (ref 65–99)
Glucose-Capillary: 54 mg/dL — ABNORMAL LOW (ref 65–99)
Glucose-Capillary: 56 mg/dL — ABNORMAL LOW (ref 65–99)
Glucose-Capillary: 325 mg/dL — ABNORMAL HIGH (ref 65–99)

## 2017-02-15 LAB — MAGNESIUM: Magnesium: 1.8 mg/dL (ref 1.7–2.4)

## 2017-02-15 MED ORDER — INSULIN NPH (HUMAN) (ISOPHANE) 100 UNIT/ML ~~LOC~~ SUSP
12.0000 [IU] | Freq: Every day | SUBCUTANEOUS | Status: DC
Start: 2017-02-15 — End: 2017-02-16
  Administered 2017-02-15: 12 [IU] via SUBCUTANEOUS

## 2017-02-15 MED ORDER — DILTIAZEM HCL ER COATED BEADS 240 MG PO CP24
240.0000 mg | ORAL_CAPSULE | Freq: Every day | ORAL | Status: DC
  Administered 2017-02-15: 240 mg via ORAL
  Filled 2017-02-15: qty 1

## 2017-02-15 MED ORDER — DILTIAZEM HCL 60 MG PO TABS
60.0000 mg | ORAL_TABLET | Freq: Once | ORAL | Status: AC
  Administered 2017-02-15: 60 mg via ORAL
  Filled 2017-02-15: qty 1

## 2017-02-15 MED ORDER — INSULIN NPH (HUMAN) (ISOPHANE) 100 UNIT/ML ~~LOC~~ SUSP
25.0000 [IU] | Freq: Every day | SUBCUTANEOUS | Status: DC
  Administered 2017-02-16: 25 [IU] via SUBCUTANEOUS
  Filled 2017-02-15: qty 10

## 2017-02-15 MED ORDER — MAGNESIUM SULFATE 4 GM/100ML IV SOLN
4.0000 g | Freq: Once | INTRAVENOUS | Status: AC
  Administered 2017-02-15: 4 g via INTRAVENOUS
  Filled 2017-02-15: qty 100

## 2017-02-15 MED ORDER — ASPIRIN EC 81 MG PO TBEC
81.0000 mg | DELAYED_RELEASE_TABLET | Freq: Every day | ORAL | Status: DC
  Administered 2017-02-15 – 2017-02-16 (×2): 81 mg via ORAL
  Filled 2017-02-15 (×2): qty 1

## 2017-02-15 MED ORDER — SPIRONOLACTONE 25 MG PO TABS
12.5000 mg | ORAL_TABLET | Freq: Two times a day (BID) | ORAL | Status: DC
  Administered 2017-02-15 – 2017-02-16 (×2): 12.5 mg via ORAL
  Filled 2017-02-15 (×2): qty 1

## 2017-02-15 MED ORDER — POTASSIUM CHLORIDE CRYS ER 20 MEQ PO TBCR
40.0000 meq | EXTENDED_RELEASE_TABLET | ORAL | Status: AC
  Administered 2017-02-15 (×2): 40 meq via ORAL
  Filled 2017-02-15 (×2): qty 2

## 2017-02-15 MED ORDER — POTASSIUM CHLORIDE CRYS ER 20 MEQ PO TBCR
40.0000 meq | EXTENDED_RELEASE_TABLET | Freq: Every day | ORAL | Status: DC
  Administered 2017-02-16: 40 meq via ORAL
  Filled 2017-02-15: qty 2

## 2017-02-15 MED ORDER — INSULIN NPH (HUMAN) (ISOPHANE) 100 UNIT/ML ~~LOC~~ SUSP: 16.0000 [IU] | Freq: Every day | SUBCUTANEOUS | Status: DC

## 2017-02-15 MED ORDER — MAGNESIUM SULFATE 2 GM/50ML IV SOLN
2.0000 g | Freq: Once | INTRAVENOUS | Status: DC
  Filled 2017-02-15: qty 50

## 2017-02-15 MED ORDER — BISOPROLOL FUMARATE 5 MG PO TABS
5.0000 mg | ORAL_TABLET | Freq: Every day | ORAL | Status: DC
  Administered 2017-02-15 – 2017-02-16 (×2): 5 mg via ORAL
  Filled 2017-02-15 (×2): qty 1

## 2017-02-15 NOTE — Consult Note (Signed)
Cardiology Consultation:   Patient ID: Tara Flores; [id number]; 12-18-1935   Admit date: 02/09/2017 Date of Consult: 02/15/2017  Primary Care Provider: Annita Brod, MD Primary Cardiologist: Dr. Eden Emms   Patient Profile:   Tara Flores is a 81 y.o. female with a hx of known moderate but nonobstructive 3V CAD by cath in 2016, chronic diastolic HF, atrial fibrillation, HTN, IDDM, tobacco abuse, COPD and h/o partial left foot amputation for osteomyelitis, hypothyroidism and anxiety/depression, who is being seen today for the evaluation of acute on chronic CHF, at the request of Dr. Janee Morn, Internal Medicine.  History of Present Illness:   She is followed by Dr. Eden Emms. Pt was first evaluate by [facility] in August 2016  for surgical clearance prior to hip surgery, to treat a hip fracture 2/2 a mechanical fall. We were asked to clear for surgery given her multiple cardiac risk factors. She was also noted at that time to have diffuse ST depressions in both the inferior and lateral leads. Subsequently, she underwent a LHC on 02/05/15 which showed triple vessel CAD with mild-moderate diffuse disease in the heavily calcified RCA and circumflex system. There was moderate heavily calcified stenosis involving the mid LAD. Fractional flow reserve was 0.81 suggesting the stenosis was moderate, however not flow limiting. LVEF was normal. No PCI was indicated. Medical therapy was recommended. She was cleared for surgery and underwent successful IM nail placement to the right hip.   In April 2018, she was admitted for osteomyelitis involving the left foot and underwent  partial left foot amputation.  She was readmitted again in May 2018 for new onset atrial fibrillation. Echo showed normal LVEF. She was treated with rate control with Cardizem. Her CHA2DS2 VASc score was calculated at 6, thus she was placed on oral anticoagulation. She was started on Eliquis 5 mg BID. She has not been reliable when it  comes to office f/u. Her  Last OV with Dr. Eden Emms was January 2017.   She presented to the [facility] ED on 02/09/17 with complaint of LEE, dyspnea w/ orthopnea, wheezing and dry cough. Pt was felt to be in acute on chronic CHF with BNP of 471 and CXR findings also c/w acute CHF. Pt also felt to be in acute COPD exacerbation. She was given albuterol and developed RVR with her atrial fibrillation. Albuterol discontinued and transitioned to Xopenex. She was given IV Lasix. She was continued on PO Cardizem and PRN IV metoprolol was ordered for rate control. Cardiac enzymes were cycled and negative. 2D echo showed normal LVEF at 60-65%. No WMAs. Pt initially had good response to IV Lasix. She was transitioned back to PO lasix, however she redeveloped LEE and IV lasix was restarted. Cardiology now consulted to assist with management of her acute CHF.     Past Medical History:  Diagnosis Date  . Anxiety   . Arthritis   . Asthma   . CAD (coronary artery disease)   . Cardiac disease   . COPD (chronic obstructive pulmonary disease) (HCC)   . Depression   . Diabetes mellitus without complication (HCC)   . Hyperlipidemia   . Hypertension   . Hypothyroidism     Past Surgical History:  Procedure Laterality Date  . AMPUTATION Left 10/12/2016   Procedure: TRANSMETATARSAL AMPUTATION LEFT FOOT WITH ACHILLES TENDON LENGTHENING;  Surgeon: Felecia Shelling, DPM;  Location: [facility] OR;  Service: Podiatry;  Laterality: Left;  . APPENDECTOMY    . CARDIAC CATHETERIZATION N/A 02/05/2015   Procedure:  Left Heart Cath and Coronary Angiography;  Surgeon: Kathleene Hazel, MD;  Location: [facility] INVASIVE CV LAB;  Service: Cardiovascular;  Laterality: N/A;  . EYE SURGERY  2006   unsure if exact procedure  . HIP FRACTURE SURGERY    . INTRAMEDULLARY (IM) NAIL INTERTROCHANTERIC Right 02/06/2015   Procedure: INTRAMEDULLARY (IM) NAIL RIGHT HIP;  Surgeon: Tarry Kos, MD;  Location: [facility] OR;  Service: Orthopedics;  Laterality: Right;  .  JOINT REPLACEMENT     due to hip fracture     Home Medications:  Prior to Admission medications   Medication Sig Start Date End Date Taking? Authorizing Provider  acetaminophen (TYLENOL) 325 MG tablet Take 2 tablets (650 mg total) by mouth every 6 (six) hours as needed for mild pain (or Fever >/= 101). 10/13/16  Yes Leroy Sea, MD  albuterol (PROVENTIL HFA;VENTOLIN HFA) 108 (90 Base) MCG/ACT inhaler Inhale 1 puff into the lungs every 6 (six) hours as needed for wheezing or shortness of breath. Reported on 09/06/2015 09/25/15  Yes Elvina Sidle, MD  albuterol (PROVENTIL) (2.5 MG/3ML) 0.083% nebulizer solution USE 1 VIAL VIA NEBULIZER  EVERY 6 HOURS AS NEEDED FOR WHEEZING OR SHORTNESS OF  BREATH. 11/02/16  Yes Sharon Seller, NP  apixaban (ELIQUIS) 5 MG TABS tablet Take 1 tablet (5 mg total) by mouth 2 (two) times daily. 11/23/16  Yes Marquette Saa, MD  busPIRone (BUSPAR) 5 MG tablet Take 5 mg by mouth at bedtime. 02/01/17  Yes [provider]  diltiazem (CARDIZEM CD) 120 MG 24 hr capsule Take 1 capsule (120 mg total) by mouth daily at 6 PM. 11/23/16  Yes Marquette Saa, MD  insulin NPH Human (HUMULIN N,NOVOLIN N) 100 UNIT/ML injection Inject 12-25 Units into the skin 2 (two) times daily. 25 units in the morning and 12 units bedtime   Yes [provider]  levothyroxine (SYNTHROID, LEVOTHROID) 150 MCG tablet Take 150 mcg by mouth daily before breakfast.   Yes [provider]  nicotine (NICODERM CQ - DOSED IN MG/24 HOURS) 14 mg/24hr patch Place 14 mg onto the skin daily.   Yes [provider]  nitroGLYCERIN (NITROSTAT) 0.4 MG SL tablet Place 1 tablet (0.4 mg total) under the tongue every 5 (five) minutes as needed for chest pain. 07/18/15  Yes Wendall Stade, MD  insulin aspart (NOVOLOG) 100 UNIT/ML injection Before each meal 3 times a day, 140-199 - 2 units, 200-250 - 4 units, 251-299 - 6 units,  300-349 - 8 units,  350 or above 10  units. Insulin PEN if approved, provide syringes and needles if needed. Patient not taking: Reported on 11/19/2016 10/13/16   Leroy Sea, MD  insulin glargine (LANTUS) 100 UNIT/ML injection Inject 0.15 mLs (15 Units total) into the skin 2 (two) times daily. Patient not taking: Reported on 11/19/2016 10/13/16   Leroy Sea, MD  oxyCODONE-acetaminophen (PERCOCET/ROXICET) 5-325 MG tablet Take 1 tablet by mouth every 6 (six) hours as needed for severe pain. Patient not taking: Reported on 02/09/2017 10/13/16   Leroy Sea, MD  sulfamethoxazole-trimethoprim (BACTRIM DS,SEPTRA DS) 800-160 MG tablet Take 1 tablet by mouth 2 (two) times daily. For 26 days 11/07/16   [provider]    Inpatient Medications: Scheduled Meds: . apixaban  5 mg Oral BID  . budesonide (PULMICORT) nebulizer solution  0.5 mg Nebulization BID  . busPIRone  5 mg Oral QHS  . diltiazem  240 mg Oral q1800  . feeding supplement (GLUCERNA SHAKE)  237 mL Oral TID WC  . furosemide  60 mg Intravenous BID  . hydrocerin   Topical BID  . insulin aspart  0-5 Units Subcutaneous QHS  . insulin aspart  0-9 Units Subcutaneous TID WC  . insulin NPH Human  12 Units Subcutaneous QHS  . [START ON 02/16/2017] insulin NPH Human  25 Units Subcutaneous QAC breakfast  . ipratropium  0.5 mg Nebulization BID  . isosorbide mononitrate  15 mg Oral Daily  . levalbuterol  0.63 mg Nebulization BID  . levothyroxine  150 mcg Oral QAC breakfast  . lisinopril  2.5 mg Oral Daily  . nicotine  14 mg Transdermal Daily  . [START ON 02/16/2017] potassium chloride  40 mEq Oral Daily   Continuous Infusions:  PRN Meds: acetaminophen, ALPRAZolam, hydrOXYzine, levalbuterol, metoprolol tartrate, nitroGLYCERIN, ondansetron **OR** ondansetron (ZOFRAN) IV  Allergies:    Allergies  Allergen Reactions  . Brethine [Terbutaline]     Made patient confused  . Keflex [Cephalexin] Nausea Only    Loss of appetite    Social History:   Social  History   Social History  . Marital status: Widowed    Spouse name: N/A  . Number of children: N/A  . Years of education: N/A   Occupational History  . Not on file.   Social History Main Topics  . Smoking status: Current Every Day Smoker    Packs/day: 0.50    Years: 63.00    Types: Cigarettes  . Smokeless tobacco: Never Used  . Alcohol use 0.0 oz/week     Comment: 02/09/2017 "used to have a glass of wine at night; stopped cause I take so many pills"  . Drug use: No  . Sexual activity: No   Other Topics Concern  . Not on file   Social History Narrative   Diet:      Do you drink/ eat things with caffeine? yes      Marital status:  widowed                             What year were you married ? 1969      Do you live in a house, apartment,assistred living, condo, trailer, etc.)?trailer      Is it one or more stories? no      How many persons live in your home ? none      Do you have any pets in your home ?(please list) 1 dog      Current or past profession: LPN      Do you exercise?  yes                            Type & how often: with PT      Do you have a living will? no      Do you have a DNR form? no                      If not, do you want to discuss one? yes      Do you have signed POA?HPOA forms?   no              If so, please bring to your        appointment          Family History:    Family History  Problem Relation Age of Onset  .  Alcohol abuse Father   . Cancer Mother        lung  . Heart disease Mother   . Diabetes Sister   . Heart disease Sister      ROS:  Please see the history of present illness.  Review of Systems  Cardiovascular: Positive for leg swelling and orthopnea. Negative for chest pain and palpitations.  Gastrointestinal: Negative for anorexia.    All other ROS reviewed and negative.     Physical Exam/Data:   Vitals:   02/15/17 0844 02/15/17 0855 02/15/17 0856 02/15/17 1132  BP: 121/73   107/64  Pulse:    (!) 107   Resp:    20  Temp:    (!) 97.5 F (36.4 C)  TempSrc:    Oral  SpO2:  95% 95% 95%  Weight:      Height:        Intake/Output Summary (Last 24 hours) at 02/15/17 1409 Last data filed at 02/15/17 0925  Gross per 24 hour  Intake              960 ml  Output             1700 ml  Net             -740 ml   Filed Weights   02/13/17 0458 02/14/17 0642 02/15/17 0453  Weight: 156 lb 12.8 oz (71.1 kg) 157 lb (71.2 kg) 160 lb 3.2 oz (72.7 kg)   Body mass index is 30.27 kg/m.  General:  Well nourished, well developed, in no acute distress HEENT: legally blind (sitter present at bedside) Lymph: no adenopathy Neck: no JVD Endocrine:  No thryomegaly Vascular: No carotid bruits; FA pulses 2+ bilaterally without bruits  Cardiac:  irregularly irregular rhythm. Slightly tachy rate.  Lungs:  Slightly decreased BS at the bases, no wheezing, rhonchi or rales  Abd: soft, nontender, no hepatomegaly  Ext: 2+ bilateral LEE pitting edema  Musculoskeletal:  No deformities, BUE and BLE strength normal and equal Skin: warm and dry  Neuro:  CNs 2-12 intact, no focal abnormalities noted Psych:  Normal affect   EKG:  The EKG was personally reviewed and demonstrates:  02/12/17 >> atrial fibrillation w/ RVR  Telemetry:  Telemetry was personally reviewed and demonstrates:    Relevant CV Studies: 2D Echo 02/12/17  Study Conclusions  - Left ventricle: The cavity size was normal. There was severe   concentric hypertrophy. Systolic function was normal. The   estimated ejection fraction was in the range of 60% to 65%. Wall   motion was normal; there were no regional wall motion   abnormalities. The study was not technically sufficient to allow   evaluation of LV diastolic dysfunction due to atrial   fibrillation. - Aortic valve: Trileaflet; mildly thickened, mildly calcified   leaflets. There was no regurgitation. - Mitral valve: Calcified annulus. Mildly thickened leaflets .   There was mild  regurgitation. Valve area by continuity equation   (using LVOT flow): 1.31 cm^2. - Left atrium: The atrium was mildly dilated. - Right ventricle: The cavity size was mildly dilated. Wall   thickness was normal. Systolic function was mildly reduced. - Right atrium: The atrium was moderately dilated. - Tricuspid valve: There was moderate regurgitation. - Pulmonic valve: There was trivial regurgitation. - Pulmonary arteries: Systolic pressure was moderately increased.   PA peak pressure: 54 mm Hg (S). - Inferior vena cava: The vessel was dilated. The respirophasic   diameter changes were blunted (<  50%), consistent with elevated   central venous pressure. - Pericardium, extracardiac: There was no pericardial effusion.  Laboratory Data:  Chemistry Recent Labs Lab 02/13/17 0353 02/14/17 0339 02/15/17 0316  NA 137 132* 136  K 3.8 4.0 3.0*  CL 95* 93* 94*  CO2 33* 31 35*  GLUCOSE 244* 350* 105*  BUN 21* 23* 24*  CREATININE 0.69 0.69 0.70  CALCIUM 9.0 8.9 8.7*  GFRNONAA >60 >60 >60  GFRAA >60 >60 >60  ANIONGAP 9 8 7      Recent Labs Lab 02/09/17 1307  PROT 7.1  ALBUMIN 2.9*  AST 19  ALT 17  ALKPHOS 145*  BILITOT 0.5   Hematology Recent Labs Lab 02/09/17 1307 02/11/17 0405 02/14/17 0339  WBC 5.8 5.8 6.3  RBC 4.17 3.99 4.06  HGB 12.4 11.9* 12.3  HCT 38.5 37.5 37.8  MCV 92.3 94.0 93.1  MCH 29.7 29.8 30.3  MCHC 32.2 31.7 32.5  RDW 13.8 14.1 13.8  PLT 303 308 303   Cardiac Enzymes Recent Labs Lab 02/09/17 2342 02/10/17 0558  TROPONINI <0.03 <0.03    Recent Labs Lab 02/09/17 1803  TROPIPOC 0.00    BNP Recent Labs Lab 02/09/17 1736  BNP 471.3*    DDimer No results for input(s): DDIMER in the last 168 hours.  Lipid Panel     Component Value Date/Time   CHOL 136 02/08/2015 0418   TRIG 72 02/08/2015 0418   HDL 52 02/08/2015 0418   CHOLHDL 2.6 02/08/2015 0418   VLDL 14 02/08/2015 0418   LDLCALC 70 02/08/2015 0418   Radiology/Studies:  No  results found.  Assessment and Plan:    1. Acute on Chronic Diastolic CHF: echo this admit with normal LVEF at 60-65%. Admit BNP abnormal at 471. CXR also with small pleural effusions bilaterally + trace interstitial edema. Pt treated with IV Lasix initially with transition to PO, now back to IV at 60 mg BID given recurrent LEE. Good UOP. -2.L out yesterday. I/Os net negative 6.7 L total. Renal function and BP stable. Continue Lasix. Replete K for hypokalemia. She is also in atrial fibrillation with mildly elevated HR in the low 100s-110s. She may benefit from attempt at DCCV>> better diuresis with conversion back to NSR. She has been on Eliquis for a/c and reports full compliance.  2. Atrial Fibrillation: pt also with a/c diastolic HF. Rate is in the 100s-110s. She may benefit from attempt at DCCV this admission. Will defer to MD. CHA2DS2 VASc score is 6. Continue Eliquis 5 mg BID for stroke prophylaxis. Continue Cardizem but also consider cardioselective BB, once euvolemic, given increased HR and concomitant CAD and diastolic dysfunction.   3. CAD: h/o mild-moderate 3V CAD in LAD, Circumflex and RCA by cath in 01/2015. She denies CP. Echo with normal LVEF and wall motion. Continue medical therapy and control of cardiac risk factors. She is not currently on ASA, but given her CAD, would recommend even in the setting of Eliquis. Consider addition of cardioselective BB. She is currently not on a statin. No listed statin intolerances. Check FLP in the am and recommend initiation of statin therapy if LDL not at goal of < 70 mg/dL.   4. Acute COPD Exacerbation: management per primary team. Agree with switch from albuterol>>xopenex, given atrial fibrillation.   5. HTN: controlled on current regimen. Monitor.   6. IDDM: management per primary.   7. HLD: no recent lipid panel on file. Pt with 3V CAD. Will need aggressive management of lipids with goal  LDL <70 mg/dL. We will order FLP in the am.   8.  Hypothyroidism: TSH on admit 5.494. Pt on levothyroxine. Dose may need adjustments. ? Check free T3/T4. Further management per primary.   9. Hypokalemia: K low at 3.0 today, in the setting of IV diuretics. Supplement. F/u BMP in the am. Monitor closely while diuresing.  Mg level was checked today>> stable at 1.8.    Signed, Robbie Lis, PA-C  02/15/2017 2:09 PM    Patient seen and examined. Agree with assessment and plan. Ms Diez is a 81 yo WF originally from [place] who has known calcified moderate 3 v CAD by cardiac catheterization in August 2016.  She has been on medical therapy.  She has a history of osteomyelitis and underwent left foot partial amputation in April 2018.  She has a history of atrial fibrillation and has been on anticoagulation. She has a long-standing tobacco history with previous 2 pack per day habit more recently smoking 5 cigarettes per day.  She has had issues with volume overload and lower extremity edema.  An echo Doppler studies show normal systolic function with diastolic dysfunction.  She has been in atrial fibrillation with rates in the 100 to 115 range and is on eliquis for anticoagulation.  She denies any chest tightness.  She believes her shortness of breath has improved with IV Lasix therapy, but she continues have significant lower extremity edema.  On exam, blood pressure is 110/75, atrial fibrillation rate is in the low 100s.  Sclera is anicteric.  JVD was 78 cm.  She had decreased breath sounds bilaterally without audible wheezing.  Rhythm was irregularly irregular and tachycardic at 110 bpm.   Abdomen was protuberant with positive bowel sounds.  She had 2+ tense lower extremity edema and is status post left partial foot amputation.  ECG shows atrial fibrillation with rapid ventricular rate.  Her echo reveals normal systolic function and she was in atrial fibrillation, not allowing for evaluation of diastolic dysfunction.  There was mitral annular calcification with  mild MR, mild aortic sclerosis without stenosis, biatrial enlargement, right greater than left, moderate TR, and moderate PA hypertension with an estimated peak pressure at 54 mm.  At present, would add aspirin to the patient's eliquis with her extensive coronary calcification and associated CAD.  Recommend the addition of spironolactone, which will be helpful with her diastolic dysfunction as well as continued edema and hypokalemia.  She may require switching from furosemide to torsemide.  Will also initiate low dose bisoprolol for cardioselective beta-blockade with her underlying COPD.  Target LDL is less than 70 in this patient with CAD and would recheck fasting lipid studies in a.m.if not already done    Lennette Bihari, MD, [facility] 02/15/2017 5:17 PM

## 2017-02-15 NOTE — Progress Notes (Signed)
PROGRESS NOTE    Tara Flores  ZOX:[id number] DOB: 06/01/1936 DOA: 02/09/2017 PCP: Annita Brod, MD    Brief Narrative:  Patient is a 81 yo with history of anxiety, osteoarthritis, asthma, COPD witbuse, depression, type 2 diabetes, hypertension, hyperlipemia, hypothyroidism presented to the ED with acute CHF exacerbation.   Assessment & Plan:   Principal Problem:   Acute on chronic diastolic (congestive) heart failure (HCC) Active Problems:   Essential hypertension   Hypothyroidism   Coronary artery disease involving native coronary artery of native heart without angina pectoris   Hypokalemia   Atrial fibrillation with RVR (HCC)   COPD exacerbation (HCC)   Type 2 diabetes mellitus with hypoglycemia (HCC)   Tobacco use disorder   Pulmonary emphysema (HCC)   #1 acute on chronic diastolic heart failure Questionable etiology. Cardiac enzymes negative. Patient was transitioned to oral diuretics however patient now with worsening lower extremity edema. Patient changed to IV Lasix yesterday. Patient with a urine output of 2.100 L over the past 24 hours. 2-D echo with EF 60-65% with no wall motion abnormalities. Lasix dose has been increased to 60 mg IV every 12 hours. Continue Cardizem,Imdur, lisinopril. Due to patient's worsening volume overload Will consult with cardiology for further evaluation and management. Outpatient follow up.  #2 A. Fib with RVR Patient with heart rates still elevated in the 120s. Increase Cardizem to 240 mg daily from 180 mg daily. Will give extra dose of 60 mg by mouth Cardizem 1 now. Continue Eliquis for anticoagulation.  #3 hypokalemia Repleted.   #4 hypothyroidism TSH slightly elevated at 5.494.continue current dose of Synthroid. Outpatient follow-up. Patient will need repeat thyroid function studies done in about 4-6 weeks.  #5 hypertension Continue current regimen of lisinopril, Imdur, Cardizem, Lasix.  #6 type 2 diabetes mellitus Hemoglobin  A1c was 9.0 11/19/2016.CBGs as low as 54. NPH was adjusted yesterday due to elevated CBGs in the 300s to 400s. Change NPH back to home regimen of 12 units daily at bedtime and 25 units every morning. Sliding scale insulin.  #7 tobacco use disorder Continue nicotine patch.   #8 COPD Continue current nebulizer treatments and Pulmicort. s/p a dose of IV Solu-Medrol 1 02/13/2017, as patient with some wheezing noted on examination. Clinical improvement.   DVT prophylaxis: eliquis Code Status: DO NOT RESUSCITATE Family Communication: updated patient and daughter at bedside. Disposition Plan: likely to skilled nursing facility once heart rate is better controlled and volume status improved.    Consultants:   Palliative care: Dr. Linna Darner 02/11/2017  Cardiology pending    Procedures:   CXR 02/09/2017  2-D echo 02/12/2017  Antimicrobials:   Macrobid 02/10/2017>>>>02/15/2017   Subjective: Patient sleeping in chair easily arousable. Patient denies shortness of breath. No CP.  Objective: Vitals:   02/15/17 0844 02/15/17 0855 02/15/17 0856 02/15/17 1132  BP: 121/73   107/64  Pulse:    (!) 107  Resp:    20  Temp:    (!) 97.5 F (36.4 C)  TempSrc:    Oral  SpO2:  95% 95% 95%  Weight:      Height:        Intake/Output Summary (Last 24 hours) at 02/15/17 1221 Last data filed at 02/15/17 0925  Gross per 24 hour  Intake              960 ml  Output             1700 ml  Net             -  740 ml   Filed Weights   02/13/17 0458 02/14/17 0642 02/15/17 0453  Weight: 71.1 kg (156 lb 12.8 oz) 71.2 kg (157 lb) 72.7 kg (160 lb 3.2 oz)    Examination:  General exam: NAD Respiratory system: Fair air movement. Decreased BS in bases. Cardiovascular system: rregularly irregular. No JVD. No murmurs rubs or gallops. 3+ bilateral lower extremity edema.  Gastrointestinal system: Abdomen is nontender, nondistended, positive bowel sounds. Central nervous system: Sleeping however easily  arousable. Oriented. No focal neurological deficits. Extremities: Symmetric 5 x 5 power. Skin: Lower extremities with chronic venous stasis changes. 3 + BLE edema.  Psychiatry: Judgement and insight appear fair. Mood & affect appropriate.     Data Reviewed: I have personally reviewed following labs and imaging studies  CBC:  Recent Labs Lab 02/09/17 1307 02/11/17 0405 02/14/17 0339  WBC 5.8 5.8 6.3  NEUTROABS 3.7  --   --   HGB 12.4 11.9* 12.3  HCT 38.5 37.5 37.8  MCV 92.3 94.0 93.1  PLT 303 308 303   Basic Metabolic Panel:  Recent Labs Lab 02/09/17 1307 02/09/17 2342  02/11/17 0405 02/12/17 0622 02/13/17 0353 02/14/17 0339 02/15/17 0316  NA 140  --   < > 136 137 137 132* 136  K 3.4*  --   < > 3.3* 3.8 3.8 4.0 3.0*  CL 105  --   < > 99* 99* 95* 93* 94*  CO2 28  --   < > 31 31 33* 31 35*  GLUCOSE 50*  --   < > 261* 243* 244* 350* 105*  BUN 19  --   < > 16 20 21* 23* 24*  CREATININE 0.61  --   < > 0.59 0.67 0.69 0.69 0.70  CALCIUM 8.5*  --   < > 8.5* 8.6* 9.0 8.9 8.7*  MG 1.9 2.0  --   --   --   --   --  1.8  PHOS 3.8  --   --   --   --   --   --   --   < > = values in this interval not displayed. GFR: Estimated Creatinine Clearance: 52 mL/min (by C-G formula based on SCr of 0.7 mg/dL). Liver Function Tests:  Recent Labs Lab 02/09/17 1307  AST 19  ALT 17  ALKPHOS 145*  BILITOT 0.5  PROT 7.1  ALBUMIN 2.9*   No results for input(s): LIPASE, AMYLASE in the last 168 hours. No results for input(s): AMMONIA in the last 168 hours. Coagulation Profile: No results for input(s): INR, PROTIME in the last 168 hours. Cardiac Enzymes:  Recent Labs Lab 02/09/17 2342 02/10/17 0558  TROPONINI <0.03 <0.03   BNP (last 3 results) No results for input(s): PROBNP in the last 8760 hours. HbA1C: No results for input(s): HGBA1C in the last 72 hours. CBG:  Recent Labs Lab 02/14/17 2100 02/15/17 0734 02/15/17 0757 02/15/17 1132 02/15/17 1203  GLUCAP 244* 53* 68  54* 56*   Lipid Profile: No results for input(s): CHOL, HDL, LDLCALC, TRIG, CHOLHDL, LDLDIRECT in the last 72 hours. Thyroid Function Tests: No results for input(s): TSH, T4TOTAL, FREET4, T3FREE, THYROIDAB in the last 72 hours. Anemia Panel: No results for input(s): VITAMINB12, FOLATE, FERRITIN, TIBC, IRON, RETICCTPCT in the last 72 hours. Sepsis Labs:  Recent Labs Lab 02/09/17 1318  LATICACIDVEN 1.11    No results found for this or any previous visit (from the past 240 hour(s)).       Radiology Studies: No  results found.      Scheduled Meds: . apixaban  5 mg Oral BID  . budesonide (PULMICORT) nebulizer solution  0.5 mg Nebulization BID  . busPIRone  5 mg Oral QHS  . diltiazem  240 mg Oral q1800  . feeding supplement (GLUCERNA SHAKE)  237 mL Oral TID WC  . furosemide  60 mg Intravenous BID  . hydrocerin   Topical BID  . insulin aspart  0-5 Units Subcutaneous QHS  . insulin aspart  0-9 Units Subcutaneous TID WC  . insulin NPH Human  16 Units Subcutaneous QHS  . insulin NPH Human  30 Units Subcutaneous QAC breakfast  . ipratropium  0.5 mg Nebulization BID  . isosorbide mononitrate  15 mg Oral Daily  . levalbuterol  0.63 mg Nebulization BID  . levothyroxine  150 mcg Oral QAC breakfast  . lisinopril  2.5 mg Oral Daily  . nicotine  14 mg Transdermal Daily  . potassium chloride  40 mEq Oral Q4H  . [START ON 02/16/2017] potassium chloride  40 mEq Oral Daily   Continuous Infusions:   LOS: 5 days    Time spent: 35 mins    THOMPSON,DANIEL, MD [facility] Pager [phone]  If 7PM-7AM, please contact night-coverage [email or web address] Password [facility] 02/15/2017, 12:21 PM

## 2017-02-15 NOTE — Progress Notes (Signed)
Pt only wants her Xanax and refusing Buspar for the night time, RN saw the ACE wrap on her left ankle being amputated but did not see any order for dressing change frequency, will continue to evaluate it

## 2017-02-15 NOTE — Care Management Important Message (Signed)
Important Message  Patient Details  Name: Tara Flores MRN: [id number] Date of Birth: 1935-11-09   Medicare Important Message Given:  Yes    Kentavius Dettore 02/15/2017, 1:28 PM

## 2017-02-15 NOTE — Progress Notes (Signed)
Patient very anxious. Has been refusing her Buspar, and asking for xanax only. Doesn't want to take them together.

## 2017-02-15 NOTE — Progress Notes (Signed)
Inpatient Diabetes Program Recommendations  AACE/ADA: New Consensus Statement on Inpatient Glycemic Control (2015)  Target Ranges:  Prepandial:   less than 140 mg/dL      Peak postprandial:   less than 180 mg/dL (1-2 hours)      Critically ill patients:  140 - 180 mg/dL   Lab Results  Component Value Date   GLUCAP 68 02/15/2017   HGBA1C 9.0 (H) 11/19/2016    Review of Glycemic Control  Diabetes history: Type 2 Outpatient Diabetes medications: NPH 25 units qam, NPH 12 units qpm from medical record Current orders for Inpatient glycemic control: NPH 30 units qam, NPH 16 units qpm  Inpatient Diabetes Program Recommendations:    Noted steroids have been discontinued. -Decrease NPH insulin to home regimen of 25 units am + 12 units pm  Thank you, Billy Fischer. Hansen Carino, RN, MSN, CDE  Diabetes Coordinator Inpatient Glycemic Control Team Team Pager [phone] (8am-5pm) 02/15/2017 10:58 AM

## 2017-02-15 NOTE — Clinical Social Work Note (Signed)
Per MD in morning progression, patient is not stable for discharge to SNF today. Admissions coordinator notified.  Charlynn Court, CSW [phone]

## 2017-02-15 NOTE — Progress Notes (Signed)
Physical Therapy Treatment Patient Details Name: Tara Flores MRN: [id number] DOB: 1935-10-27 Today's Date: 02/15/2017    History of Present Illness 81 y.o. female with medical history significant of anxiety, osteoarthritis, asthma, CAD, COPD, depression, type 2 diabetes, hyperlipidemia, hypertension, hypothyroidism, active smoker coming to the emergency department by her PCP due to uncontrolled diabetes, worsening lower extremity edema and increased shortness of breath    PT Comments    Patient tolerated bilat LE therex well with resistance. Pt limited by drowsiness this am suspect due to medications. Continue to progress as tolerated with anticipated d/c to SNF for further skilled PT services.    Follow Up Recommendations  Supervision/Assistance - 24 hour;SNF     Equipment Recommendations  None recommended by PT    Recommendations for Other Services       Precautions / Restrictions Precautions Precautions: Fall Restrictions Weight Bearing Restrictions: No    Mobility  Bed Mobility               General bed mobility comments: pt OOB in recliner chair upon arrival  Transfers                 General transfer comment: pt declined sit to stand transfers due to anxiety about urinary incontinence when standing  Ambulation/Gait                 Stairs            Wheelchair Mobility    Modified Rankin (Stroke Patients Only)       Balance Overall balance assessment: Needs assistance   Sitting balance-Leahy Scale: Good                                      Cognition Arousal/Alertness: Lethargic;Suspect due to medications Behavior During Therapy: [facility] for tasks assessed/performed Overall Cognitive Status: Within Functional Limits for tasks assessed                                        Exercises Total Joint Exercises Knee Flexion: AROM;Both;10 reps;Seated;Other (comment) (isometric; with resistance from  therapist) General Exercises - Lower Extremity Long Arc Quad: AROM;Strengthening;Both;Seated;20 reps Hip ABduction/ADduction: AROM;Strengthening;Seated;Both;Other (comment) (ABD with resistance X20 and ADD X20) Hip Flexion/Marching: AROM;Seated;Both;20 reps (with resistance)    General Comments        Pertinent Vitals/Pain Pain Assessment: No/denies pain    Home Living                      Prior Function            PT Goals (current goals can now be found in the care plan section) Acute Rehab PT Goals PT Goal Formulation: With patient Time For Goal Achievement: 02/24/17 Potential to Achieve Goals: Good Progress towards PT goals: Progressing toward goals    Frequency    Min 3X/week      PT Plan Discharge plan needs to be updated    Co-evaluation              AM-PAC PT "6 Clicks" Daily Activity  Outcome Measure  Difficulty turning over in bed (including adjusting bedclothes, sheets and blankets)?: A Little Difficulty moving from lying on back to sitting on the side of the bed? : A Little Difficulty sitting down on and standing up  from a chair with arms (e.g., wheelchair, bedside commode, etc,.)?: Unable Help needed moving to and from a bed to chair (including a wheelchair)?: A Lot Help needed walking in hospital room?: A Lot Help needed climbing 3-5 steps with a railing? : A Lot 6 Click Score: 13    End of Session Equipment Utilized During Treatment: Gait belt Activity Tolerance: Patient tolerated treatment well Patient left: in chair;with call bell/phone within reach;with chair alarm set Nurse Communication: Mobility status PT Visit Diagnosis: Difficulty in walking, not elsewhere classified (R26.2)     Time: 1610-9604 PT Time Calculation (min) (ACUTE ONLY): 14 min  Charges:  $Therapeutic Exercise: 8-22 mins                    G Codes:       Erline Levine, PTA Pager: [phone]     Carolynne Edouard 02/15/2017, 10:01 AM

## 2017-02-16 ENCOUNTER — Other Ambulatory Visit: Payer: Self-pay

## 2017-02-16 LAB — CBC WITH DIFFERENTIAL/PLATELET
Basophils Absolute: 0.1 10*3/uL (ref 0.0–0.1)
Basophils Relative: 1 %
EOS ABS: 0.8 10*3/uL — AB (ref 0.0–0.7)
EOS PCT: 10 %
HCT: 40.9 % (ref 36.0–46.0)
Hemoglobin: 12.8 g/dL (ref 12.0–15.0)
LYMPHS ABS: 1.8 10*3/uL (ref 0.7–4.0)
Lymphocytes Relative: 23 %
MCH: 29.7 pg (ref 26.0–34.0)
MCHC: 31.3 g/dL (ref 30.0–36.0)
MCV: 94.9 fL (ref 78.0–100.0)
MONOS PCT: 9 %
Monocytes Absolute: 0.7 10*3/uL (ref 0.1–1.0)
Neutro Abs: 4.6 10*3/uL (ref 1.7–7.7)
Neutrophils Relative %: 57 %
PLATELETS: 293 10*3/uL (ref 150–400)
RBC: 4.31 MIL/uL (ref 3.87–5.11)
RDW: 13.5 % (ref 11.5–15.5)
WBC: 7.9 10*3/uL (ref 4.0–10.5)

## 2017-02-16 LAB — BASIC METABOLIC PANEL
Anion gap: 6 (ref 5–15)
BUN: 28 mg/dL — AB (ref 6–20)
CHLORIDE: 93 mmol/L — AB (ref 101–111)
CO2: 34 mmol/L — AB (ref 22–32)
CREATININE: 0.76 mg/dL (ref 0.44–1.00)
Calcium: 8.7 mg/dL — ABNORMAL LOW (ref 8.9–10.3)
GFR calc Af Amer: >60 mL/min (ref >60)
GFR calc non Af Amer: >60 mL/min (ref >60)
Glucose, Bld: 141 mg/dL — ABNORMAL HIGH (ref 65–99)
Potassium: 3.7 mmol/L (ref 3.5–5.1)
SODIUM: 133 mmol/L — AB (ref 135–145)

## 2017-02-16 LAB — LIPID PANEL
CHOL/HDL RATIO: 2.3 ratio
CHOLESTEROL: 155 mg/dL (ref 0–200)
HDL: 67 mg/dL (ref >40)
LDL Cholesterol: 80 mg/dL (ref 0–99)
Triglycerides: 39 mg/dL (ref <150)
VLDL: 8 mg/dL (ref 0–40)

## 2017-02-16 LAB — GLUCOSE, CAPILLARY
GLUCOSE-CAPILLARY: 88 mg/dL (ref 65–99)
GLUCOSE-CAPILLARY: 161 mg/dL — AB (ref 65–99)
GLUCOSE-CAPILLARY: 234 mg/dL — AB (ref 65–99)

## 2017-02-16 LAB — MAGNESIUM: MAGNESIUM: 2.3 mg/dL (ref 1.7–2.4)

## 2017-02-16 MED ORDER — GLUCERNA SHAKE PO LIQD
237.0000 mL | Freq: Three times a day (TID) | ORAL | 0 refills | Status: DC
Start: 2017-02-16 — End: 2017-11-03

## 2017-02-16 MED ORDER — LISINOPRIL 2.5 MG PO TABS: 2.5000 mg | ORAL_TABLET | Freq: Every day | ORAL | 0 refills | Status: DC

## 2017-02-16 MED ORDER — HYDROXYZINE HCL 10 MG PO TABS: 10.0000 mg | ORAL_TABLET | Freq: Three times a day (TID) | ORAL | 0 refills | Status: DC | PRN

## 2017-02-16 MED ORDER — ASPIRIN 81 MG PO TBEC: 81.0000 mg | DELAYED_RELEASE_TABLET | Freq: Every day | ORAL | Status: DC

## 2017-02-16 MED ORDER — BUDESONIDE-FORMOTEROL FUMARATE 160-4.5 MCG/ACT IN AERO: 2.0000 | INHALATION_SPRAY | Freq: Two times a day (BID) | RESPIRATORY_TRACT | 0 refills | Status: DC

## 2017-02-16 MED ORDER — FUROSEMIDE 20 MG PO TABS: 60.0000 mg | ORAL_TABLET | Freq: Two times a day (BID) | ORAL | 0 refills | Status: DC

## 2017-02-16 MED ORDER — DILTIAZEM HCL ER COATED BEADS 240 MG PO CP24: 240.0000 mg | ORAL_CAPSULE | Freq: Every day | ORAL | 0 refills | Status: DC

## 2017-02-16 MED ORDER — LEVALBUTEROL HCL 0.63 MG/3ML IN NEBU
0.6300 mg | INHALATION_SOLUTION | Freq: Four times a day (QID) | RESPIRATORY_TRACT | 0 refills | Status: DC | PRN
Start: 2017-02-16 — End: 2019-11-02

## 2017-02-16 MED ORDER — HYDROCERIN EX CREA: 1.0000 "application " | TOPICAL_CREAM | Freq: Every day | CUTANEOUS | 0 refills | Status: DC

## 2017-02-16 MED ORDER — BISOPROLOL FUMARATE 5 MG PO TABS: 5.0000 mg | ORAL_TABLET | Freq: Every day | ORAL | 0 refills | Status: DC

## 2017-02-16 MED ORDER — BUSPIRONE HCL 5 MG PO TABS: 5.0000 mg | ORAL_TABLET | Freq: Every day | ORAL | 0 refills | Status: DC

## 2017-02-16 MED ORDER — SPIRONOLACTONE 25 MG PO TABS: 12.5000 mg | ORAL_TABLET | Freq: Two times a day (BID) | ORAL | 0 refills | Status: AC

## 2017-02-16 MED ORDER — ISOSORBIDE MONONITRATE ER 30 MG PO TB24: 15.0000 mg | ORAL_TABLET | Freq: Every day | ORAL | 0 refills | Status: DC

## 2017-02-16 MED ORDER — TIOTROPIUM BROMIDE MONOHYDRATE 18 MCG IN CAPS: 18.0000 ug | ORAL_CAPSULE | Freq: Every day | RESPIRATORY_TRACT | 0 refills | Status: DC

## 2017-02-16 MED ORDER — ALPRAZOLAM 0.25 MG PO TABS: 0.2500 mg | ORAL_TABLET | Freq: Two times a day (BID) | ORAL | 0 refills | Status: DC | PRN

## 2017-02-16 NOTE — Clinical Social Work Note (Addendum)
RN has paged ortho tech regarding boot fitting. Patient unsure whether family will transport or if she will need PTAR. CSW left voicemail for patient's daughter.  Charlynn Court, CSW [phone]  1:40 pm Patient's daughter trying to find out if her brother can transport patient to [facility]. She may not hear back from him until after 3:30 pm. If he cannot transport she is agreeable to PTAR.  Charlynn Court, CSW [phone]

## 2017-02-16 NOTE — Progress Notes (Signed)
Patient discharged to [facility] transport by [employer].

## 2017-02-16 NOTE — Progress Notes (Signed)
Patient alert and oriented, vitals and blood sugar stable. Up, bath and dressd in chair, one assist to bed side commode. Discharge order for SNF, waiting on ortho for boots for amputated left foot. SW is aware of discharge.

## 2017-02-16 NOTE — Clinical Social Work Placement (Signed)
   CLINICAL SOCIAL WORK PLACEMENT  NOTE  Date:  02/16/2017  Patient Details  Name: Tara Flores MRN: [id number] Date of Birth: 1936/03/27  Clinical Social Work is seeking post-discharge placement for this patient at the Skilled  Nursing Facility level of care (*CSW will initial, date and re-position this form in  chart as items are completed):  Yes   Patient/family provided with [facility] Clinical Social Work Department's list of facilities offering this level of care within the geographic area requested by the patient (or if unable, by the patient's family).  Yes   Patient/family informed of their freedom to choose among providers that offer the needed level of care, that participate in Medicare, Medicaid or managed care program needed by the patient, have an available bed and are willing to accept the patient.  Yes   Patient/family informed of [facility]'s ownership interest in [facility] and [facility], as well as of the fact that they are under no obligation to receive care at these facilities.  PASRR submitted to EDS on 02/11/17     PASRR number received on       Existing PASRR number confirmed on 02/11/17     FL2 transmitted to all facilities in geographic area requested by pt/family on 02/11/17     FL2 transmitted to all facilities within larger geographic area on       Patient informed that his/her managed care company has contracts with or will negotiate with certain facilities, including the following:        Yes   Patient/family informed of bed offers received.  Patient chooses bed at [facility]     Physician recommends and patient chooses bed at      Patient to be transferred to [facility] on 02/16/17.  Patient to be transferred to facility by PTAR     Patient family notified on 02/16/17 of transfer.  Name of family member notified:  [facility]     PHYSICIAN       Additional Comment:     _______________________________________________ Margarito Liner, LCSW 02/16/2017, 4:23 PM

## 2017-02-16 NOTE — Progress Notes (Signed)
Orthopedic Tech Progress Note Patient Details:  Tara Flores August 17, 1935 [id number]  Ortho Devices Type of Ortho Device: Postop shoe/boot Ortho Device/Splint Location: applied post op shoe on pt left foot.  pt tolerated application very well.  floor nurse at bedside.  Left foot. Ortho Device/Splint Interventions: Application, Adjustment   Alvina Chou 02/16/2017, 2:08 PM

## 2017-02-16 NOTE — Discharge Summary (Signed)
Physician Discharge Summary  TOM RAGSDALE ZOX:[id number] DOB: 1936/02/15 DOA: 02/09/2017  PCP: Annita Brod, MD  Admit date: 02/09/2017 Discharge date: 02/16/2017  Time spent: 65 minutes  Recommendations for Outpatient Follow-up:  1. Discharged to [facility] skilled nursing facility. Patient will follow-up with M.D. at the skilled nursing facility. Patient will need a basic metabolic profile done in 2 weeks to follow-up on electrolytes and renal function. 2. Follow-up with Dr. Eden Emms, cardiology in a few weeks. Office will call with follow-up appointment time.   Discharge Diagnoses:  Principal Problem:   Acute on chronic diastolic (congestive) heart failure (HCC) Active Problems:   Essential hypertension   Hypothyroidism   Coronary artery disease involving native coronary artery of native heart without angina pectoris   Hypokalemia   Atrial fibrillation with RVR (HCC)   COPD exacerbation (HCC)   Type 2 diabetes mellitus with hypoglycemia (HCC)   Tobacco use disorder   Pulmonary emphysema (HCC)   Discharge Condition: Stable and improved  Diet recommendation: Heart healthy  Filed Weights   02/14/17 0642 02/15/17 0453 02/16/17 0545  Weight: 71.2 kg (157 lb) 72.7 kg (160 lb 3.2 oz) 71.2 kg (157 lb)    History of present illness:  Per Dr Willa Rough is a 81 y.o. female with medical history significant of anxiety, osteoarthritis, asthma, CAD, COPD, depression, type 2 diabetes, hyperlipidemia, hypertension, hypothyroidism, active smoker coming to the emergency department by her PCP due to uncontrolled diabetes, worsening lower extremity edema and increased shortness of breath. She complained of lower extremity edema, occasional orthopnea, wheezing and dry cough. denied fever, chills, headache, sore throat, chest pain, palpitations, dizziness, diaphoresis, PND, abdominal pain, nausea, emesis, diarrhea, constipation, melena, hematochezia, dysuria, frequency, polyuria,  polydipsia or blurred vision.  ED Course: Initial vital signs in emergency department temperature 97.4, pulse 75, blood pressure 141/104, respirations 16 and O2 sat 99%. The patient was given a total 12.5 mg of albuterol and developed RVR. She also received furosemide 40 mg IVP and was encouraged to eat due to hypoglycemia. Her CBC was unremarkable. Chemistries showed normal troponin, BNP 471.3 pg/mL. Sodium 140, potassium 3.4, chloride 105, bicarbonate 28 lactic acid 1.11 mmol/L. BUN 19, creatinine 0.61 and glucose is 50 mg/dL. Albumin was 2.9 g/dL and alkaline phosphatase 145. The rest of the LFTs were within normal limits. Chest radiograph is consistent with CHF. Please see images sent for radiology report for further details  Hospital Course:  #1 acute on chronic diastolic heart failure Questionable etiology. Patient was admitted placed on IV Lasix and monitored. Cardiac enzymes negative. Patient improved clinically during the hospitalization. Patient was -8.936 L during the hospitalization. As patient was improving clinically patient was subsequently transitioned to oral diuretics however on a dose of 40 mg orally twice daily patient was noted to have worsening lower extremity edema with fluid buildup/volume overload. 2-D echo with EF 60-65% with no wall motion abnormalities. Patient was placed back on Lasix 60 mg IV every 12 hours and cardiology consulted as patient is acute on chronic diastolic heart failure was started to be difficult to manage. Patient was seen in consultation by Dr. Tresa Endo, cardiology who made some medication changes. Bisoprolol was added to patient's regimen as well as Aldactone. Patient improved clinically and diuresed and will be discharged home on oral Lasix 60 mg twice daily, lisinopril, Cardizem, Imdur Lasix dose has been increased to 60 mg IV every 12 hours. Continue Cardizem,Imdur, lisinopril. Due to patient's worsening volume overload Will consult with cardiology for  further  evaluation and management. Outpatient follow up.  #2 A. Fib with RVR Patient with heart rates noted to be elevated in the 130s to 140s initially patient's Cardizem dose was adjusted with improvement with heart rate however still somewhat elevated and a such Cardizem was further uptitrated 240 mg daily. Bisoprolol was also added to patient's regimen. Patient's rate was better controlled on this regimen. Patient was also maintained on eliquis for anticoagulation.  #3 hypokalemia Secondary to diureses. Repleted.   #4 hypothyroidism TSH slightly elevated at 5.494.continue current dose of Synthroid. Outpatient follow-up. Patient will need repeat thyroid function studies done in about 4-6 weeks.  #5 hypertension Patient was initially maintained on lisinopril Imdur Cardizem and Lasix. Patient was seen in consultation by cardiology for patient's heart failure and medication changes were made. Patient was placed on bisoprolol, and Aldactone. Patient blood pressure remained stable. Outpatient follow-up.   #6 type 2 diabetes mellitus Hemoglobin A1c was 9.0 11/19/2016.during the hospitalization patient's insulin dose was adjusted based on her blood glucose levels. Patient did receive some doses of IV steroids which caused an increase in her blood glucose levels and as such patient's NPH was adjusted. Post adjustment patient was noted to have subsequently significant low CBGs in the 50s although asymptomatic. NPH dose was subsequently decreased back to home regimen. Patient was also seen by diabetic coordinator during the hospitalization. Outpatient follow-up.   #7 tobacco use disorder Tobacco cessation. Patient was maintained on a nicotine patch.  #8 COPD Patient was maintained on nebulizer treatments and Pulmicort. Patient received a dose of IV Solu-Medrol on 02/13/2017 secondary to wheezing which subsequently resolved. Patient was discharged home on Symbicort and Spiriva as well as Xopenex nebs as  needed. Tobacco cessation. Outpatient follow-up.     Procedures:  CXR 02/09/2017  2-D echo 02/12/2017  Antimicrobials:   Macrobid 02/10/2017>>>>02/15/2017   Consultations:  Cardiology: Dr. Tresa Endo 02/15/2017  Palliative care Dr. Linna Darner 02/11/2017    Discharge Exam: Vitals:   02/16/17 0545 02/16/17 1000  BP: 114/70 121/77  Pulse: 85 85  Resp: 18   Temp: 97.7 F (36.5 C) 97.7 F (36.5 C)  SpO2: 94%     General: NAD Cardiovascular: Irregularly irregular. 2+ BLE edema Respiratory: CTAB. No wheezes. No crackles.  Discharge Instructions   Discharge Instructions    Diet - low sodium heart healthy    Complete by:  As directed    1500cc/day fluid restriction.   Increase activity slowly    Complete by:  As directed      Current Discharge Medication List    START taking these medications   Details  ALPRAZolam (XANAX) 0.25 MG tablet Take 1 tablet (0.25 mg total) by mouth 2 (two) times daily as needed for anxiety. Qty: 10 tablet, Refills: 0    aspirin EC 81 MG EC tablet Take 1 tablet (81 mg total) by mouth daily.    bisoprolol (ZEBETA) 5 MG tablet Take 1 tablet (5 mg total) by mouth daily. Qty: 30 tablet, Refills: 0    budesonide-formoterol (SYMBICORT) 160-4.5 MCG/ACT inhaler Inhale 2 puffs into the lungs 2 (two) times daily. Qty: 1 Inhaler, Refills: 0    feeding supplement, GLUCERNA SHAKE, (GLUCERNA SHAKE) LIQD Take 237 mLs by mouth 3 (three) times daily with meals. Refills: 0    furosemide (LASIX) 20 MG tablet Take 3 tablets (60 mg total) by mouth 2 (two) times daily. Qty: 180 tablet, Refills: 0    hydrocerin (EUCERIN) CREA Apply 1 application topically daily. Refills: 0  hydrOXYzine (ATARAX/VISTARIL) 10 MG tablet Take 1 tablet (10 mg total) by mouth 3 (three) times daily as needed for itching. Qty: 15 tablet, Refills: 0    isosorbide mononitrate (IMDUR) 30 MG 24 hr tablet Take 0.5 tablets (15 mg total) by mouth daily. Qty: 30 tablet, Refills: 0     levalbuterol (XOPENEX) 0.63 MG/3ML nebulizer solution Take 3 mLs (0.63 mg total) by nebulization every 6 (six) hours as needed for wheezing or shortness of breath. Qty: 3 mL, Refills: 0    lisinopril (PRINIVIL,ZESTRIL) 2.5 MG tablet Take 1 tablet (2.5 mg total) by mouth daily. Qty: 30 tablet, Refills: 0    spironolactone (ALDACTONE) 25 MG tablet Take 0.5 tablets (12.5 mg total) by mouth 2 (two) times daily. Qty: 30 tablet, Refills: 0    tiotropium (SPIRIVA HANDIHALER) 18 MCG inhalation capsule Place 1 capsule (18 mcg total) into inhaler and inhale daily. Qty: 30 capsule, Refills: 0      CONTINUE these medications which have CHANGED   Details  busPIRone (BUSPAR) 5 MG tablet Take 1 tablet (5 mg total) by mouth at bedtime. Qty: 10 tablet, Refills: 0    diltiazem (CARDIZEM CD) 240 MG 24 hr capsule Take 1 capsule (240 mg total) by mouth daily at 6 PM. Qty: 30 capsule, Refills: 0      CONTINUE these medications which have NOT CHANGED   Details  acetaminophen (TYLENOL) 325 MG tablet Take 2 tablets (650 mg total) by mouth every 6 (six) hours as needed for mild pain (or Fever >/= 101).    apixaban (ELIQUIS) 5 MG TABS tablet Take 1 tablet (5 mg total) by mouth 2 (two) times daily. Qty: 60 tablet, Refills: 0    insulin NPH Human (HUMULIN N,NOVOLIN N) 100 UNIT/ML injection Inject 12-25 Units into the skin 2 (two) times daily. 25 units in the morning and 12 units bedtime    levothyroxine (SYNTHROID, LEVOTHROID) 150 MCG tablet Take 150 mcg by mouth daily before breakfast.    nicotine (NICODERM CQ - DOSED IN MG/24 HOURS) 14 mg/24hr patch Place 14 mg onto the skin daily.    nitroGLYCERIN (NITROSTAT) 0.4 MG SL tablet Place 1 tablet (0.4 mg total) under the tongue every 5 (five) minutes as needed for chest pain. Qty: 25 tablet, Refills: 3      STOP taking these medications     albuterol (PROVENTIL HFA;VENTOLIN HFA) 108 (90 Base) MCG/ACT inhaler      albuterol (PROVENTIL) (2.5 MG/3ML)  0.083% nebulizer solution      insulin aspart (NOVOLOG) 100 UNIT/ML injection      insulin glargine (LANTUS) 100 UNIT/ML injection      oxyCODONE-acetaminophen (PERCOCET/ROXICET) 5-325 MG tablet      sulfamethoxazole-trimethoprim (BACTRIM DS,SEPTRA DS) 800-160 MG tablet        Allergies  Allergen Reactions  . Brethine [Terbutaline]     Made patient confused  . Keflex [Cephalexin] Nausea Only    Loss of appetite    Contact information for follow-up providers    [facility] Follow up.   Specialty:  Home Health Services Why:  They will continue to do your home health care at your home Contact information: [address] [place] [address] [phone]        Wendall Stade, MD Follow up.   Specialty:  Cardiology Why:  office will call to set up outpatient follow up in few weeks. Contact information: [address] [place] [address] [phone]  Palliative Care Follow up.   Why:  to f/u at facility           Contact information for after-discharge care    Destination    [facility] Follow up.   Specialty:  Skilled Nursing Facility Contact information: [address] [place] [address] [phone]                   The results of significant diagnostics from this hospitalization (including imaging, microbiology, ancillary and laboratory) are listed below for reference.    Significant Diagnostic Studies: Dg Chest 2 View  Result Date: 02/09/2017 CLINICAL DATA:  Lower extremity edema EXAM: CHEST  2 VIEW COMPARISON:  Nov 19, 2016 FINDINGS: There are small pleural effusions bilaterally, slightly larger on the right than on the left. There is slight bibasilar atelectasis. There is trace interstitial edema. There is no appreciable airspace consolidation. Heart is borderline enlarged with pulmonary venous hypertension. No adenopathy. There are foci of coronary artery  calcification as well as aortic atherosclerosis. There also foci of calcification in the right brachial and axillary arteries and calcification in each carotid artery. No evident bone lesions. IMPRESSION: Findings felt to be indicative of a degree of congestive heart failure. No airspace consolidation. Aortic atherosclerosis. There are foci of calcification in each carotid artery as well as in the right axillary and brachial arteries. Foci of coronary artery calcification also noted. Aortic Atherosclerosis (ICD10-I70.0). Electronically Signed   By: Bretta Bang III M.D.   On: 02/09/2017 14:58    Microbiology: No results found for this or any previous visit (from the past 240 hour(s)).   Labs: Basic Metabolic Panel:  Recent Labs Lab 02/09/17 1307 02/09/17 2342  02/13/17 0353 02/14/17 9147 02/15/17 0316 02/15/17 1906 02/16/17 0521 02/16/17 0921  NA 140  --   < > 137 132* 136 132*  --  133*  K 3.4*  --   < > 3.8 4.0 3.0* 4.4  --  3.7  CL 105  --   < > 95* 93* 94* 89*  --  93*  CO2 28  --   < > 33* 31 35* 36*  --  34*  GLUCOSE 50*  --   < > 244* 350* 105* 388*  --  141*  BUN 19  --   < > 21* 23* 24* 24*  --  28*  CREATININE 0.61  --   < > 0.69 0.69 0.70 0.91  --  0.76  CALCIUM 8.5*  --   < > 9.0 8.9 8.7* 8.4*  --  8.7*  MG 1.9 2.0  --   --   --  1.8  --  2.3  --   PHOS 3.8  --   --   --   --   --   --   --   --   < > = values in this interval not displayed. Liver Function Tests:  Recent Labs Lab 02/09/17 1307  AST 19  ALT 17  ALKPHOS 145*  BILITOT 0.5  PROT 7.1  ALBUMIN 2.9*   No results for input(s): LIPASE, AMYLASE in the last 168 hours. No results for input(s): AMMONIA in the last 168 hours. CBC:  Recent Labs Lab 02/09/17 1307 02/11/17 0405 02/14/17 0339 02/15/17 1906 02/16/17 0921  WBC 5.8 5.8 6.3 7.1 7.9  NEUTROABS 3.7  --   --  4.5 4.6  HGB 12.4 11.9* 12.3 13.3 12.8  HCT 38.5 37.5 37.8 42.2 40.9  MCV 92.3 94.0 93.1 94.4 94.9  PLT 303 308 303 300 293    Cardiac Enzymes:  Recent Labs Lab 02/09/17 2342 02/10/17 0558  TROPONINI <0.03 <0.03   BNP: BNP (last 3 results)  Recent Labs  11/19/16 1939 02/09/17 1736  BNP 464.5* 471.3*    ProBNP (last 3 results) No results for input(s): PROBNP in the last 8760 hours.  CBG:  Recent Labs Lab 02/15/17 1247 02/15/17 1651 02/15/17 2132 02/16/17 0755 02/16/17 1055  GLUCAP 100* 287* 325* 88 161*       Signed:  Damire Remedios MD.  [facility] 02/16/2017, 1:00 PM

## 2017-02-16 NOTE — Clinical Social Work Note (Signed)
CSW facilitated patient discharge including contacting patient family and facility to confirm patient discharge plans. Clinical information faxed to facility and family agreeable with plan. CSW arranged ambulance transport via [facility] to [facility]. RN to call report prior to discharge ([phone]).  CSW will sign off for now as social work intervention is no longer needed. Please consult us again if new needs arise.  Jennet Scroggin, CSW [phone]   

## 2017-02-16 NOTE — Progress Notes (Signed)
Progress Note  Patient Name: Tara Flores Date of Encounter: 02/16/2017  Primary Cardiologist: Tresa Endo  Subjective   No complaints wants to go to rehab  Inpatient Medications    Scheduled Meds: . apixaban  5 mg Oral BID  . aspirin EC  81 mg Oral Daily  . bisoprolol  5 mg Oral Daily  . budesonide (PULMICORT) nebulizer solution  0.5 mg Nebulization BID  . busPIRone  5 mg Oral QHS  . diltiazem  240 mg Oral q1800  . feeding supplement (GLUCERNA SHAKE)  237 mL Oral TID WC  . furosemide  60 mg Intravenous BID  . hydrocerin   Topical BID  . insulin aspart  0-5 Units Subcutaneous QHS  . insulin aspart  0-9 Units Subcutaneous TID WC  . insulin NPH Human  12 Units Subcutaneous QHS  . insulin NPH Human  25 Units Subcutaneous QAC breakfast  . ipratropium  0.5 mg Nebulization BID  . isosorbide mononitrate  15 mg Oral Daily  . levalbuterol  0.63 mg Nebulization BID  . levothyroxine  150 mcg Oral QAC breakfast  . lisinopril  2.5 mg Oral Daily  . nicotine  14 mg Transdermal Daily  . potassium chloride  40 mEq Oral Daily  . spironolactone  12.5 mg Oral BID   Continuous Infusions:  PRN Meds: acetaminophen, ALPRAZolam, hydrOXYzine, levalbuterol, metoprolol tartrate, nitroGLYCERIN, ondansetron **OR** ondansetron (ZOFRAN) IV   Vital Signs    Vitals:   02/15/17 1922 02/15/17 1946 02/15/17 1950 02/16/17 0545  BP: 130/87   114/70  Pulse: 98   85  Resp: 18   18  Temp: 98.3 F (36.8 C)   97.7 F (36.5 C)  TempSrc: Oral   Oral  SpO2: 98% 94% 94% 94%  Weight:    157 lb (71.2 kg)  Height:        Intake/Output Summary (Last 24 hours) at 02/16/17 5009 Last data filed at 02/16/17 3818  Gross per 24 hour  Intake              720 ml  Output             2700 ml  Net            -1980 ml   Filed Weights   02/14/17 0642 02/15/17 0453 02/16/17 0545  Weight: 157 lb (71.2 kg) 160 lb 3.2 oz (72.7 kg) 157 lb (71.2 kg)    Telemetry    Afib good rate control  - Personally Reviewed  ECG     Afib no acute ST changes  - Personally Reviewed  Physical Exam  Chronically ill white female  GEN: No acute distress.   Neck: No JVD Cardiac: RRR, no murmurs, rubs, or gallops.  Respiratory: Clear to auscultation bilaterally. GI: Soft, nontender, non-distended  MS: No edema; No deformity. Neuro:  Nonfocal  Psych: Normal affect  Left forefoot amputation   Labs    Chemistry Recent Labs Lab 02/09/17 1307  02/14/17 0339 02/15/17 0316 02/15/17 1906  NA 140  < > 132* 136 132*  K 3.4*  < > 4.0 3.0* 4.4  CL 105  < > 93* 94* 89*  CO2 28  < > 31 35* 36*  GLUCOSE 50*  < > 350* 105* 388*  BUN 19  < > 23* 24* 24*  CREATININE 0.61  < > 0.69 0.70 0.91  CALCIUM 8.5*  < > 8.9 8.7* 8.4*  PROT 7.1  --   --   --   --  ALBUMIN 2.9*  --   --   --   --   AST 19  --   --   --   --   ALT 17  --   --   --   --   ALKPHOS 145*  --   --   --   --   BILITOT 0.5  --   --   --   --   GFRNONAA >60  < > >60 >60 58*  GFRAA >60  < > >60 >60 >60  ANIONGAP 7  < > 8 7 7   < > = values in this interval not displayed.   Hematology Recent Labs Lab 02/11/17 0405 02/14/17 0339 02/15/17 1906  WBC 5.8 6.3 7.1  RBC 3.99 4.06 4.47  HGB 11.9* 12.3 13.3  HCT 37.5 37.8 42.2  MCV 94.0 93.1 94.4  MCH 29.8 30.3 29.8  MCHC 31.7 32.5 31.5  RDW 14.1 13.8 13.7  PLT 308 303 300    Cardiac Enzymes Recent Labs Lab 02/09/17 2342 02/10/17 0558  TROPONINI <0.03 <0.03    Recent Labs Lab 02/09/17 1803  TROPIPOC 0.00     BNP Recent Labs Lab 02/09/17 1736  BNP 471.3*     DDimer No results for input(s): DDIMER in the last 168 hours.   Radiology    No results found.  Cardiac Studies   Echo: EF 60-65% no diastolic evaluation due to afib  Patient Profile     81 y.o. female admitted with dyspnea and edema with dry cough. COPD exacerbation BNP mildly elevated 400 range r/o chronic afib  Assessment & Plan    1) Afib good rate control continue eliquis 2) ? Diastolic dysfunction improved  change lasix to PO and on aldactone  3) COPD improved no active wheezing  Ok to go to rehab will sign off   Signed, Charlton Haws, MD  02/16/2017, 8:33 AM

## 2017-02-16 NOTE — Progress Notes (Signed)
I gave report to Bobbiejo the RN at [facility], transportation is on the way, IV and tele box out.

## 2017-05-12 ENCOUNTER — Encounter: Payer: Self-pay | Admitting: Cardiovascular Disease

## 2017-05-19 NOTE — Progress Notes (Signed)
Patient ID: Tara Flores, female   DOB: 1936-04-02, 81 y.o.   MRN: [id number]     Cardiology Office Note   Date:  05/24/2017   ID:  Tara Flores, DOB 23-Mar-1936, MRN [id number]  PCP:  Annita Brod, MD  Cardiologist:   Charlton Haws, MD   No chief complaint on file.     History of Present Illness: Tara Flores is a 81 y.o. female who presents for F/U CAD .  Had mechanical fall in August 2018  we were asked to clear for surgery   No known history of coronary disease but with multiple risk factors including hypertension, insulin dependent diabetes and a 60 + yr h/o tobacco abuse,  Her EKG demonstrates diffuse ST depressions in both the inferior and lateral leads. There are no prior EKGs to compare. She had a heart cath to clear her for surgery Moderate LAD disease with normal flow wire.  No post op complications  Cath 02/05/15 films reviewed   Prox Cx to Mid Cx lesion, 30% stenosed.  Prox RCA lesion, 30% stenosed.  Mid RCA lesion, 30% stenosed.  Dist RCA lesion, 30% stenosed.  Prox LAD to Dist LAD lesion, 60% stenosed.  The left ventricular systolic function is normal.  1. Triple vessel CAD with mild to moderate diffuse disease in the heavily calcified RCA and Circumflex system.  2. Moderate heavily calcified stenosis mid LAD. Fractional flow reserve 0.81 suggesting the stenosis is moderate and not flow limiting.  3. Normal LV systolic function  Echo with normal EF Carotid with plaque no stenosis   Also has chronic afib on eliquis for anticoagulation   She has very poor vision and sees [employer] with cane no chest pain  She is independent but would appear to need some help Has 3 children in town that rallyed when she broke her hip but tend not to see her a lot outside of emergencies   Still at [facility] and has some PT there    Past Medical History:  Diagnosis Date  . Anxiety   . Arthritis   . Asthma   . CAD (coronary artery disease)   . Cardiac  disease   . COPD (chronic obstructive pulmonary disease) (HCC)   . Depression   . Diabetes mellitus without complication (HCC)   . Hyperlipidemia   . Hypertension   . Hypothyroidism     Past Surgical History:  Procedure Laterality Date  . AMPUTATION Left 10/12/2016   Procedure: TRANSMETATARSAL AMPUTATION LEFT FOOT WITH ACHILLES TENDON LENGTHENING;  Surgeon: Felecia Shelling, DPM;  Location: [facility] OR;  Service: Podiatry;  Laterality: Left;  . APPENDECTOMY    . CARDIAC CATHETERIZATION N/A 02/05/2015   Procedure: Left Heart Cath and Coronary Angiography;  Surgeon: Kathleene Hazel, MD;  Location: [facility] INVASIVE CV LAB;  Service: Cardiovascular;  Laterality: N/A;  . EYE SURGERY  2006   unsure if exact procedure  . HIP FRACTURE SURGERY    . INTRAMEDULLARY (IM) NAIL INTERTROCHANTERIC Right 02/06/2015   Procedure: INTRAMEDULLARY (IM) NAIL RIGHT HIP;  Surgeon: Tarry Kos, MD;  Location: [facility] OR;  Service: Orthopedics;  Laterality: Right;  . JOINT REPLACEMENT     due to hip fracture     Current Outpatient Medications  Medication Sig Dispense Refill  . acetaminophen (TYLENOL) 325 MG tablet Take 2 tablets (650 mg total) by mouth every 6 (six) hours as needed for mild pain (or Fever >/= 101).    Marland Kitchen ALPRAZolam (XANAX) 0.25  MG tablet Take 1 tablet (0.25 mg total) by mouth 2 (two) times daily as needed for anxiety. 10 tablet 0  . apixaban (ELIQUIS) 5 MG TABS tablet Take 1 tablet (5 mg total) by mouth 2 (two) times daily. 60 tablet 0  . aspirin EC 81 MG EC tablet Take 1 tablet (81 mg total) by mouth daily.    . bisoprolol (ZEBETA) 5 MG tablet Take 1 tablet (5 mg total) by mouth daily. 30 tablet 0  . budesonide-formoterol (SYMBICORT) 160-4.5 MCG/ACT inhaler Inhale 2 puffs into the lungs 2 (two) times daily. 1 Inhaler 0  . busPIRone (BUSPAR) 5 MG tablet Take 1 tablet (5 mg total) by mouth at bedtime. 10 tablet 0  . diltiazem (CARDIZEM CD) 240 MG 24 hr capsule Take 1 capsule (240 mg total) by mouth daily at  6 PM. 30 capsule 0  . feeding supplement, GLUCERNA SHAKE, (GLUCERNA SHAKE) LIQD Take 237 mLs by mouth 3 (three) times daily with meals.  0  . furosemide (LASIX) 20 MG tablet Take 3 tablets (60 mg total) by mouth 2 (two) times daily. 180 tablet 0  . hydrocerin (EUCERIN) CREA Apply 1 application topically daily.  0  . hydrOXYzine (ATARAX/VISTARIL) 10 MG tablet Take 1 tablet (10 mg total) by mouth 3 (three) times daily as needed for itching. 15 tablet 0  . insulin NPH Human (HUMULIN N,NOVOLIN N) 100 UNIT/ML injection Inject 12-25 Units into the skin 2 (two) times daily. 25 units in the morning and 12 units bedtime    . isosorbide mononitrate (IMDUR) 30 MG 24 hr tablet Take 0.5 tablets (15 mg total) by mouth daily. 30 tablet 0  . levalbuterol (XOPENEX) 0.63 MG/3ML nebulizer solution Take 3 mLs (0.63 mg total) by nebulization every 6 (six) hours as needed for wheezing or shortness of breath. 3 mL 0  . levothyroxine (SYNTHROID, LEVOTHROID) 150 MCG tablet Take 150 mcg by mouth daily before breakfast.    . lisinopril (PRINIVIL,ZESTRIL) 2.5 MG tablet Take 1 tablet (2.5 mg total) by mouth daily. 30 tablet 0  . nicotine (NICODERM CQ - DOSED IN MG/24 HOURS) 14 mg/24hr patch Place 14 mg onto the skin daily.    . nitroGLYCERIN (NITROSTAT) 0.4 MG SL tablet Place 1 tablet (0.4 mg total) under the tongue every 5 (five) minutes as needed for chest pain. 25 tablet 3  . spironolactone (ALDACTONE) 25 MG tablet Take 0.5 tablets (12.5 mg total) by mouth 2 (two) times daily. 30 tablet 0  . tiotropium (SPIRIVA HANDIHALER) 18 MCG inhalation capsule Place 1 capsule (18 mcg total) into inhaler and inhale daily. 30 capsule 0   No current facility-administered medications for this visit.     Allergies:   Brethine [terbutaline] and Keflex [cephalexin]    Social History:  The patient  reports that she has been smoking cigarettes.  She has a 31.50 pack-year smoking history. she has never used smokeless tobacco. She reports that  she drinks alcohol. She reports that she does not use drugs.   Family History:  The patient's family history includes Alcohol abuse in her father; Cancer in her mother; Diabetes in her sister; Heart disease in her mother and sister.    ROS:  Please see the history of present illness.   Otherwise, review of systems are positive for none.   All other systems are reviewed and negative.    PHYSICAL EXAM: VS:  BP (!) 108/50   Pulse (!) 56   Ht 5\' 1"  (1.549 m)   SpO2  95%   BMI 29.66 kg/m  , BMI Body mass index is 29.66 kg/m. Affect appropriate Chronically ill white female  HEENT: normal Neck supple with no adenopathy JVP normal no bruits no thyromegaly Lungs clear COPD expitory wheezing  Heart:  S1/S2 no murmur, no rub, gallop or click PMI normal Abdomen: benighn, BS positve, no tenderness, no AAA no bruit.  No HSM or HJR Distal pulses intact with no bruits No edema Neuro non-focal Skin warm and dry No muscular weakness    EKG:    02/06/15  SR rate 76 normal    Recent Labs: 02/09/2017: ALT 17; B Natriuretic Peptide 471.3 02/10/2017: TSH 5.494 02/16/2017: BUN 28; Creatinine, Ser 0.76; Hemoglobin 12.8; Magnesium 2.3; Platelets 293; Potassium 3.7; Sodium 133    Lipid Panel    Component Value Date/Time   CHOL 155 02/16/2017 0521   TRIG 39 02/16/2017 0521   HDL 67 02/16/2017 0521   CHOLHDL 2.3 02/16/2017 0521   VLDL 8 02/16/2017 0521   LDLCALC 80 02/16/2017 0521      Wt Readings from Last 3 Encounters:  02/16/17 157 lb (71.2 kg)  11/23/16 134 lb 8 oz (61 kg)  10/10/16 142 lb 10.2 oz (64.7 kg)      Other studies Reviewed: Additional studies/ records that were reviewed today include: Epic notes labs, Xrays and hospital consult notes with ECG see HPI.    ASSESSMENT AND PLAN:  1.  CAD Stable with no angina and good activity level.  Continue medical Rx nitro called in  2. Hip fracture improved walking with cane PT/OT  3. HTN: Well controlled.  Continue current  medications and low sodium Dash type diet.   4. Chol:  Will have labs checked with primary Intolerant to lipitor Consider trying zocor  Cholesterol is at goal.  Continue current dose of statin and diet Rx.  No myalgias or side effects.  F/U  LFT's in 6 months. Lab Results  Component Value Date   LDLCALC 80 02/16/2017           5. DM Discussed low carb diet.  Target hemoglobin A1c is 6.5 or less.  Continue current medications. 6. COPD:  Discussed smoking cessation This clinically will be her biggest issue going forward consider Referral to pulmonary  7. Chronic Afib: on eliquis rate control with Cardizem and Zebeta   Current medicines are reviewed at length with the patient today.  The patient does not have concerns regarding medicines.  The following changes have been made:  no change  Labs/ tests ordered today include:    No orders of the defined types were placed in this encounter.    Disposition:   FU with me in 6 months      Signed, Charlton HawsPeter Kaslyn Richburg, MD  05/24/2017 10:44 AM    [facility] [address], [place], [place]  [address] Phone: [phone](336) [phone]; Fax: [phone]) [phone]

## 2017-05-24 ENCOUNTER — Encounter: Payer: Self-pay | Admitting: Cardiovascular Disease

## 2017-05-24 ENCOUNTER — Ambulatory Visit (INDEPENDENT_AMBULATORY_CARE_PROVIDER_SITE_OTHER): Payer: Medicare Other | Admitting: Cardiovascular Disease

## 2017-05-24 VITALS — BP 108/50 | HR 56 | Ht 61.0 in

## 2017-05-24 DIAGNOSIS — I10 Essential (primary) hypertension: Secondary | ICD-10-CM

## 2017-05-24 DIAGNOSIS — I25118 Atherosclerotic heart disease of native coronary artery with other forms of angina pectoris: Secondary | ICD-10-CM | POA: Diagnosis not present

## 2017-05-24 NOTE — Patient Instructions (Signed)

## 2017-05-26 ENCOUNTER — Ambulatory Visit (INDEPENDENT_AMBULATORY_CARE_PROVIDER_SITE_OTHER): Payer: Medicare Other | Admitting: Podiatry

## 2017-05-26 ENCOUNTER — Encounter: Payer: Self-pay | Admitting: Podiatry

## 2017-05-26 DIAGNOSIS — B351 Tinea unguium: Secondary | ICD-10-CM

## 2017-05-26 DIAGNOSIS — I70245 Atherosclerosis of native arteries of left leg with ulceration of other part of foot: Secondary | ICD-10-CM

## 2017-05-26 DIAGNOSIS — E0842 Diabetes mellitus due to underlying condition with diabetic polyneuropathy: Secondary | ICD-10-CM | POA: Diagnosis not present

## 2017-05-26 DIAGNOSIS — M79676 Pain in unspecified toe(s): Secondary | ICD-10-CM

## 2017-05-30 NOTE — Progress Notes (Signed)
   SUBJECTIVE Patient with a history of diabetes mellitus presents to office today complaining of elongated, thickened nails. Pain while ambulating in shoes. Patient is unable to trim their own nails.  She is requesting diabetic shoes.  Past Medical History:  Diagnosis Date  . Anxiety   . Arthritis   . Asthma   . CAD (coronary artery disease)   . Cardiac disease   . COPD (chronic obstructive pulmonary disease) (HCC)   . Depression   . Diabetes mellitus without complication (HCC)   . Hyperlipidemia   . Hypertension   . Hypothyroidism     OBJECTIVE General Patient is awake, alert, and oriented x 3 and in no acute distress. Derm Skin is dry and supple bilateral. Negative open lesions or macerations. Remaining integument unremarkable. Nails are tender, long, thickened and dystrophic with subungual debris, consistent with onychomycosis, 1-5 bilateral. No signs of infection noted. Vasc  DP and PT pedal pulses palpable bilaterally. Temperature gradient within normal limits.  Neuro Epicritic and protective threshold sensation diminished bilaterally.  Musculoskeletal Exam No symptomatic pedal deformities noted bilateral. Muscular strength within normal limits.  ASSESSMENT 1. Diabetes Mellitus w/ peripheral neuropathy 2. Onychomycosis of nail due to dermatophyte bilateral 3. Pain in foot bilateral  PLAN OF CARE 1. Patient evaluated today. 2. Instructed to maintain good pedal hygiene and foot care. Stressed importance of controlling blood sugar.  3. Mechanical debridement of nails 1-5 bilaterally performed using a nail nipper. Filed with dremel without incident.  4.  Fitted today for diabetic shoes. 5.  Return to clinic for diabetic shoe pickup.    Felecia ShellingBrent M. Marvion Bastidas, DPM [facility]  Dr. Felecia ShellingBrent M. Donie Moulton, DPM    [address]                                        [place], [place] [address]                Office [phone]) [phone]  Fax [phone]

## 2017-07-02 ENCOUNTER — Telehealth: Payer: Self-pay | Admitting: Podiatry

## 2017-07-02 NOTE — Telephone Encounter (Signed)
Called pts daughter regarding diabetic shoe paperwork we still have not received it back from Dr Lucien MonsArseno. The daughter asked if I could fax it to her and she would get it to them. I faxed it and got confirmation that it went to her.

## 2017-07-12 ENCOUNTER — Encounter (INDEPENDENT_AMBULATORY_CARE_PROVIDER_SITE_OTHER): Payer: Medicare Other | Admitting: Ophthalmology

## 2017-10-25 ENCOUNTER — Telehealth: Payer: Self-pay | Admitting: Podiatry

## 2017-10-25 NOTE — Telephone Encounter (Signed)
Pt called and asked if daughter could pick up her diabetic shoes because when she rides in the [place] from her nursing facility she gets sick.   I explained that she has to come to an appt to pick up the diabetic shoes/inserts for medicare compliance other wise medicare will not cover the shoes. She has to see one of our pedorthist and sign the compliance forms.  Pt states she was going to go see if the transportation at her nursing facility([facility]) can bring her tomorrow at 130pm. She asked when should she cxl if they are not able to bring pt and I told her as soon as possible.

## 2017-10-25 NOTE — Telephone Encounter (Signed)
pts daughter [facility] voicemail stating pt wants to cxl appt for 5.7.19 to pick up shoes.Pt told daughter she was told they could just pick up shoes and she wants the other daughter to pick them up.   I returned call and told pts daughter(Emmarae Senor) that pt has to have an appt to pick the shoes up per medicare guidelines and pt has to sign for them and to let me know what they want to do about the appt that is scheduled for 5.7.19 with this information.

## 2017-10-26 ENCOUNTER — Ambulatory Visit: Payer: Medicare Other

## 2017-11-03 ENCOUNTER — Emergency Department (HOSPITAL_COMMUNITY)
Admission: EM | Admit: 2017-11-03 | Discharge: 2017-11-03 | Disposition: A | Discharge status: Home / Self Care | Payer: Medicare Other | Attending: Emergency Medicine | Admitting: Emergency Medicine

## 2017-11-03 ENCOUNTER — Encounter (HOSPITAL_COMMUNITY): Payer: Self-pay

## 2017-11-03 DIAGNOSIS — X58XXXA Exposure to other specified factors, initial encounter: Secondary | ICD-10-CM | POA: Insufficient documentation

## 2017-11-03 DIAGNOSIS — Z8673 Personal history of transient ischemic attack (TIA), and cerebral infarction without residual deficits: Secondary | ICD-10-CM | POA: Insufficient documentation

## 2017-11-03 DIAGNOSIS — J45909 Unspecified asthma, uncomplicated: Secondary | ICD-10-CM | POA: Diagnosis not present

## 2017-11-03 DIAGNOSIS — Z79899 Other long term (current) drug therapy: Secondary | ICD-10-CM | POA: Insufficient documentation

## 2017-11-03 DIAGNOSIS — Z794 Long term (current) use of insulin: Secondary | ICD-10-CM | POA: Diagnosis not present

## 2017-11-03 DIAGNOSIS — E1165 Type 2 diabetes mellitus with hyperglycemia: Secondary | ICD-10-CM | POA: Insufficient documentation

## 2017-11-03 DIAGNOSIS — I110 Hypertensive heart disease with heart failure: Secondary | ICD-10-CM | POA: Diagnosis not present

## 2017-11-03 DIAGNOSIS — Z23 Encounter for immunization: Secondary | ICD-10-CM | POA: Insufficient documentation

## 2017-11-03 DIAGNOSIS — Y939 Activity, unspecified: Secondary | ICD-10-CM | POA: Insufficient documentation

## 2017-11-03 DIAGNOSIS — I509 Heart failure, unspecified: Secondary | ICD-10-CM | POA: Diagnosis not present

## 2017-11-03 DIAGNOSIS — S81801A Unspecified open wound, right lower leg, initial encounter: Secondary | ICD-10-CM

## 2017-11-03 DIAGNOSIS — I2510 Atherosclerotic heart disease of native coronary artery without angina pectoris: Secondary | ICD-10-CM | POA: Insufficient documentation

## 2017-11-03 DIAGNOSIS — E039 Hypothyroidism, unspecified: Secondary | ICD-10-CM | POA: Diagnosis not present

## 2017-11-03 DIAGNOSIS — S8991XA Unspecified injury of right lower leg, initial encounter: Secondary | ICD-10-CM | POA: Insufficient documentation

## 2017-11-03 DIAGNOSIS — Y92129 Unspecified place in nursing home as the place of occurrence of the external cause: Secondary | ICD-10-CM | POA: Diagnosis not present

## 2017-11-03 DIAGNOSIS — F17210 Nicotine dependence, cigarettes, uncomplicated: Secondary | ICD-10-CM | POA: Diagnosis not present

## 2017-11-03 DIAGNOSIS — Y999 Unspecified external cause status: Secondary | ICD-10-CM | POA: Diagnosis not present

## 2017-11-03 DIAGNOSIS — R739 Hyperglycemia, unspecified: Secondary | ICD-10-CM

## 2017-11-03 HISTORY — DX: Transient cerebral ischemic attack, unspecified: G45.9

## 2017-11-03 HISTORY — DX: Chronic kidney disease, unspecified: N18.9

## 2017-11-03 HISTORY — DX: Heart failure, unspecified: I50.9

## 2017-11-03 HISTORY — DX: Type 2 diabetes mellitus with diabetic neuropathy, unspecified: E11.40

## 2017-11-03 HISTORY — DX: Acquired absence of left foot: Z89.432

## 2017-11-03 HISTORY — DX: Unspecified atrial fibrillation: I48.91

## 2017-11-03 HISTORY — DX: Emphysema, unspecified: J43.9

## 2017-11-03 HISTORY — DX: Anemia in chronic kidney disease: D63.1

## 2017-11-03 HISTORY — DX: Mild cognitive impairment of uncertain or unknown etiology: G31.84

## 2017-11-03 LAB — BASIC METABOLIC PANEL
Anion gap: 7 (ref 5–15)
BUN: 34 mg/dL — AB (ref 6–20)
CHLORIDE: 89 mmol/L — AB (ref 101–111)
CO2: 32 mmol/L (ref 22–32)
Calcium: 8.8 mg/dL — ABNORMAL LOW (ref 8.9–10.3)
Creatinine, Ser: 0.76 mg/dL (ref 0.44–1.00)
GFR calc Af Amer: >60 mL/min (ref >60)
GFR calc non Af Amer: >60 mL/min (ref >60)
GLUCOSE: 420 mg/dL — AB (ref 65–99)
Potassium: 4.2 mmol/L (ref 3.5–5.1)
SODIUM: 128 mmol/L — AB (ref 135–145)

## 2017-11-03 LAB — CBC WITH DIFFERENTIAL/PLATELET
Basophils Absolute: 0 10*3/uL (ref 0.0–0.1)
Basophils Relative: 0 %
EOS ABS: 0.5 10*3/uL (ref 0.0–0.7)
EOS PCT: 5 %
HCT: 31.2 % — ABNORMAL LOW (ref 36.0–46.0)
Hemoglobin: 10.3 g/dL — ABNORMAL LOW (ref 12.0–15.0)
LYMPHS ABS: 1 10*3/uL (ref 0.7–4.0)
LYMPHS PCT: 11 %
MCH: 31.3 pg (ref 26.0–34.0)
MCHC: 33 g/dL (ref 30.0–36.0)
MCV: 94.8 fL (ref 78.0–100.0)
MONOS PCT: 9 %
Monocytes Absolute: 0.9 10*3/uL (ref 0.1–1.0)
Neutro Abs: 7.5 10*3/uL (ref 1.7–7.7)
Neutrophils Relative %: 75 %
PLATELETS: 205 10*3/uL (ref 150–400)
RBC: 3.29 MIL/uL — ABNORMAL LOW (ref 3.87–5.11)
RDW: 12.8 % (ref 11.5–15.5)
WBC: 9.9 10*3/uL (ref 4.0–10.5)

## 2017-11-03 LAB — CBG MONITORING, ED: GLUCOSE-CAPILLARY: 363 mg/dL — AB (ref 65–99)

## 2017-11-03 MED ORDER — TETANUS-DIPHTH-ACELL PERTUSSIS 5-2.5-18.5 LF-MCG/0.5 IM SUSP
0.5000 mL | Freq: Once | INTRAMUSCULAR | Status: AC
  Administered 2017-11-03: 0.5 mL via INTRAMUSCULAR
  Filled 2017-11-03: qty 0.5

## 2017-11-03 MED ORDER — INSULIN ASPART 100 UNIT/ML ~~LOC~~ SOLN
10.0000 [IU] | Freq: Once | SUBCUTANEOUS | Status: AC
  Administered 2017-11-03: 10 [IU] via SUBCUTANEOUS
  Filled 2017-11-03: qty 1

## 2017-11-03 MED ORDER — ONDANSETRON HCL 4 MG PO TABS
4.0000 mg | ORAL_TABLET | Freq: Once | ORAL | Status: AC
  Administered 2017-11-03: 4 mg via ORAL
  Filled 2017-11-03: qty 1

## 2017-11-03 MED ORDER — DOXYCYCLINE HYCLATE 100 MG PO TABS
100.0000 mg | ORAL_TABLET | Freq: Once | ORAL | Status: AC
  Administered 2017-11-03: 100 mg via ORAL
  Filled 2017-11-03: qty 1

## 2017-11-03 MED ORDER — DOXYCYCLINE HYCLATE 100 MG PO CAPS: 100.0000 mg | ORAL_CAPSULE | Freq: Two times a day (BID) | ORAL | 0 refills | Status: DC

## 2017-11-03 MED ORDER — BACITRACIN-NEOMYCIN-POLYMYXIN 400-5-5000 EX OINT
TOPICAL_OINTMENT | Freq: Once | CUTANEOUS | Status: AC
  Administered 2017-11-03: 12:00:00 via TOPICAL
  Filled 2017-11-03: qty 1

## 2017-11-03 NOTE — Discharge Instructions (Addendum)
You have a wound to your right leg.  This was cleansed and wrapped with a pressure dressing.  You will be placed on an antibiotic that you will take twice a day to prevent infection.  Please have Dr.Ariza or a member of the medical team follow the wound.  Glucose was elevated over 400.  You were given regular insulin today with improvement in your blood sugar.  Please have this rechecked soon.  Please use the pressure dressing over the next 3 or 4 days.  Return to the emergency department if any emergent changes in condition, problems, or concerns.

## 2017-11-03 NOTE — ED Provider Notes (Signed)
[facility] EMERGENCY DEPARTMENT Provider Note   CSN: [id number] Arrival date & time: 11/03/17  3244     History   Chief Complaint No chief complaint on file.   HPI Tara Flores is a 82 y.o. female.  Patient is an 82 year old female resident of [facility] nursing facility who presents to the emergency department with a wound to the right lower extremity.  The patient states that she injured her leg on yesterday May 14.  It did some bleeding, but then stopped.  It was bleeding again this morning and so she was sent to the emergency department.  She says she has some soreness in the area.  The patient is unsure if she is ever been diagnosed with methicillin-resistant staph.     Past Medical History:  Diagnosis Date  . Acquired absence of left foot (HCC)   . Anemia in chronic kidney disease   . Anxiety   . Arthritis   . Asthma   . Atrial fibrillation (HCC)   . CAD (coronary artery disease)   . Cardiac disease   . CHF (congestive heart failure) (HCC)   . CHF (congestive heart failure) (HCC)   . COPD (chronic obstructive pulmonary disease) (HCC)   . Depression   . Diabetes mellitus without complication (HCC)   . Diabetic neuropathy (HCC)   . Emphysema of lung (HCC)   . Hyperlipidemia   . Hypertension   . Hypothyroidism   . Mild cognitive impairment   . TIA (transient ischemic attack)     Patient Active Problem List   Diagnosis Date Noted  . Tobacco use disorder 02/10/2017  . Pulmonary emphysema (HCC)   . Acute on chronic diastolic (congestive) heart failure (HCC) 02/09/2017  . Type 2 diabetes mellitus with hypoglycemia (HCC) 02/09/2017  . New onset a-fib (HCC) 11/20/2016  . Atrial fibrillation with RVR (HCC)   . COPD exacerbation (HCC)   . Goals of care, counseling/discussion   . Palliative care by specialist   . TIA (transient ischemic attack) 11/19/2016  . Diabetic foot ulcer (HCC) 10/10/2016  . Hypokalemia 10/10/2016  . Anemia due to chronic kidney  disease 10/10/2016  . Cellulitis in diabetic foot (HCC) 10/09/2016  . Venous ulcer of left leg (HCC) 07/28/2016  . Dry skin dermatitis 07/28/2016  . Type 2 diabetes mellitus with hyperglycemia (HCC) 02/06/2015  . Hip fracture (HCC) 02/05/2015  . Closed right hip fracture (HCC) 02/05/2015  . Abnormal EKG 02/05/2015  . Tobacco abuse 02/05/2015  . DM2 (diabetes mellitus, type 2) (HCC) 02/05/2015  . Essential hypertension 02/05/2015  . Hypothyroidism 02/05/2015  . Coronary artery disease involving native coronary artery of native heart without angina pectoris     Past Surgical History:  Procedure Laterality Date  . AMPUTATION Left 10/12/2016   Procedure: TRANSMETATARSAL AMPUTATION LEFT FOOT WITH ACHILLES TENDON LENGTHENING;  Surgeon: Felecia Shelling, DPM;  Location: [facility] OR;  Service: Podiatry;  Laterality: Left;  . APPENDECTOMY    . CARDIAC CATHETERIZATION N/A 02/05/2015   Procedure: Left Heart Cath and Coronary Angiography;  Surgeon: Kathleene Hazel, MD;  Location: [facility] INVASIVE CV LAB;  Service: Cardiovascular;  Laterality: N/A;  . EYE SURGERY  2006   unsure if exact procedure  . HIP FRACTURE SURGERY    . INTRAMEDULLARY (IM) NAIL INTERTROCHANTERIC Right 02/06/2015   Procedure: INTRAMEDULLARY (IM) NAIL RIGHT HIP;  Surgeon: Tarry Kos, MD;  Location: [facility] OR;  Service: Orthopedics;  Laterality: Right;  . JOINT REPLACEMENT     due  to hip fracture     OB History   None      Home Medications    Prior to Admission medications   Medication Sig Start Date End Date Taking? Authorizing Provider  ALPRAZolam (XANAX) 0.25 MG tablet Take 1 tablet (0.25 mg total) by mouth 2 (two) times daily as needed for anxiety. 02/16/17  Yes Rodolph Bong, MD  Amino Acids-Protein Hydrolys (FEEDING SUPPLEMENT, PRO-STAT SUGAR FREE 64,) LIQD Take 30 mLs by mouth daily.   Yes [provider]  apixaban (ELIQUIS) 5 MG TABS tablet Take 1 tablet (5 mg total) by mouth 2 (two) times daily. 11/23/16  Yes  Marquette Saa, MD  Ascorbic Acid (VITAMIN C PO) Take 500 mg by mouth 2 (two) times daily.   Yes [provider]  bisoprolol (ZEBETA) 5 MG tablet Take 1 tablet (5 mg total) by mouth daily. 02/17/17  Yes Rodolph Bong, MD  busPIRone (BUSPAR) 5 MG tablet Take 1 tablet (5 mg total) by mouth at bedtime. 02/16/17  Yes Rodolph Bong, MD  Cholecalciferol (VITAMIN D) 2000 units tablet Take 2,000 Units by mouth daily.   Yes [provider]  diltiazem (TIAZAC) 240 MG 24 hr capsule Take 120 mg by mouth daily.   Yes [provider]  furosemide (LASIX) 20 MG tablet Take 3 tablets (60 mg total) by mouth 2 (two) times daily. Patient taking differently: Take 80 mg by mouth 2 (two) times daily.  02/16/17  Yes Rodolph Bong, MD  Infant Care Products (BABY SHAMPOO EX) Apply 1 application topically daily. Apply to eyelids every morning for irritation   Yes [provider]  insulin lispro (HUMALOG) 100 UNIT/ML injection Inject 5-10 Units into the skin 3 (three) times daily as needed. Take 5 units every evening and as needed with meals per sliding scale; sliding scale - blood glucose 71-150 give 0 units, 151-200 give 2 units, 201-250 give 4 units. 251-300 give 6 units, 301-350 give 8 units, and for 351-400 give 10 units and call provider   Yes [provider]  insulin NPH Human (HUMULIN N,NOVOLIN N) 100 UNIT/ML injection Inject 20-30 Units into the skin 2 (two) times daily. 30 units in the morning and 20 units bedtime   Yes [provider]  isosorbide mononitrate (IMDUR) 30 MG 24 hr tablet Take 0.5 tablets (15 mg total) by mouth daily. 02/17/17  Yes Rodolph Bong, MD  levalbuterol Pauline Aus) 0.63 MG/3ML nebulizer solution Take 3 mLs (0.63 mg total) by nebulization every 6 (six) hours as needed for wheezing or shortness of breath. 02/16/17  Yes Rodolph Bong, MD  levothyroxine (SYNTHROID, LEVOTHROID) 150 MCG tablet Take 150 mcg by mouth daily  before breakfast.   Yes [provider]  lisinopril (PRINIVIL,ZESTRIL) 2.5 MG tablet Take 1 tablet (2.5 mg total) by mouth daily. 02/17/17  Yes Rodolph Bong, MD  metFORMIN (GLUCOPHAGE) 1000 MG tablet Take 1,000 mg by mouth daily.   Yes [provider]  Multiple Vitamin (MULTIVITAMIN WITH MINERALS) TABS tablet Take 1 tablet by mouth daily.   Yes [provider]  nitroGLYCERIN (NITROSTAT) 0.4 MG SL tablet Place 1 tablet (0.4 mg total) under the tongue every 5 (five) minutes as needed for chest pain. 07/18/15  Yes Wendall Stade, MD  spironolactone (ALDACTONE) 25 MG tablet Take 0.5 tablets (12.5 mg total) by mouth 2 (two) times daily. Patient taking differently: Take 25 mg by mouth 2 (two) times daily.  02/16/17  Yes Ramiro Harvest  V, MD  tiotropium (SPIRIVA HANDIHALER) 18 MCG inhalation capsule Place 1 capsule (18 mcg total) into inhaler and inhale daily. 02/16/17  Yes Rodolph Bong, MD  zinc sulfate 220 (50 Zn) MG capsule Take 220 mg by mouth daily.   Yes [provider]    Family History Family History  Problem Relation Age of Onset  . Alcohol abuse Father   . Cancer Mother        lung  . Heart disease Mother   . Diabetes Sister   . Heart disease Sister     Social History Social History   Tobacco Use  . Smoking status: Current Every Day Smoker    Packs/day: 1.00    Years: 63.00    Pack years: 63.00    Types: Cigarettes  . Smokeless tobacco: Never Used  Substance Use Topics  . Alcohol use: Yes    Alcohol/week: 0.0 oz    Comment: 02/09/2017 "used to have a glass of wine at night; stopped cause I take so many pills"  . Drug use: No     Allergies   Brethine [terbutaline] and Keflex [cephalexin]   Review of Systems Review of Systems  Constitutional: Negative for activity change.       All ROS Neg except as noted in HPI  HENT: Negative for nosebleeds.   Respiratory: Negative for cough, shortness of breath and wheezing.     Cardiovascular: Negative for chest pain and palpitations.  Gastrointestinal: Negative for abdominal pain and blood in stool.  Genitourinary: Negative for dysuria, frequency and hematuria.  Musculoskeletal: Positive for arthralgias. Negative for back pain and neck pain.  Skin: Positive for wound.  Neurological: Negative for dizziness, seizures and speech difficulty.  Hematological: Negative.   Psychiatric/Behavioral: Negative for confusion and hallucinations.     Physical Exam Updated Vital Signs BP (!) 116/51 (BP Location: Right Arm)   Pulse 63   Temp 97.9 F (36.6 C) (Oral)   Resp 18   Wt 75.8 kg (167 lb)   SpO2 95%   BMI 31.55 kg/m   Physical Exam  Constitutional: She is oriented to person, place, and time. She appears well-developed and well-nourished.  Non-toxic appearance.  HENT:  Head: Normocephalic.  Right Ear: Tympanic membrane and external ear normal.  Left Ear: Tympanic membrane and external ear normal.  Eyes: Pupils are equal, round, and reactive to light. EOM and lids are normal.  Neck: Normal range of motion. Neck supple. Carotid bruit is not present.  Cardiovascular: Normal rate, regular rhythm, normal heart sounds, intact distal pulses and normal pulses.  Pulmonary/Chest: Breath sounds normal. No respiratory distress.  Abdominal: Soft. Bowel sounds are normal. There is no tenderness. There is no guarding.  Musculoskeletal: Normal range of motion.  There is a scabbed area of the mid anterior tibial region on the right.  There is mild bleeding present.  There is increased redness around the scabbed area, but this area is not hot, and there are no red streaks appreciated.  The dorsalis pedis pulses 2+.  There is fair range of motion of the right knee and hip.  No deformity appreciated.  Lymphadenopathy:       Head (right side): No submandibular adenopathy present.       Head (left side): No submandibular adenopathy present.    She has no cervical adenopathy.   Neurological: She is alert and oriented to person, place, and time. She has normal strength. No cranial nerve deficit or sensory deficit.  Skin: Skin is  warm and dry.  Psychiatric: She has a normal mood and affect. Her speech is normal.  Nursing note and vitals reviewed.    ED Treatments / Results  Labs (all labs ordered are listed, but only abnormal results are displayed) Labs Reviewed  CBC WITH DIFFERENTIAL/PLATELET  BASIC METABOLIC PANEL    EKG None  Radiology No results found.  Procedures Procedures (including critical care time)  Medications Ordered in ED Medications  Tdap (BOOSTRIX) injection 0.5 mL (has no administration in time range)  neomycin-bacitracin-polymyxin (NEOSPORIN) ointment (has no administration in time range)  doxycycline (VIBRA-TABS) tablet 100 mg (has no administration in time range)  ondansetron (ZOFRAN) tablet 4 mg (has no administration in time range)     Initial Impression / Assessment and Plan / ED Course  I have reviewed the triage vital signs and the nursing notes.  Pertinent labs & imaging results that were available during my care of the patient were reviewed by me and considered in my medical decision making (see chart for details).     Case discussed with Dr Estell Harpin.  Final Clinical Impressions(s) / ED Diagnoses MDm  Vital signs within normal limits.  Patient has a wound to the right mid anterior tibial area.  There is mild bleeding present.  There is some increased redness around the wound.  We will cover the patient with antibiotics.  We will asked staff at [facility] to clean the wound daily with soap and water and apply a nonstick bandage.  Complete blood count is nonacute.  The basic metabolic panel shows the glucose to be elevated at 420.  The patient was given subcutaneous insulin.  Glucose is trending down at 363.  I have asked the staff at nursing facility to recheck sugar upon arrival back to the facility.  The patient is  to see the primary physician or return to the emergency department if not improving.   Final diagnoses:  Wound of right lower extremity, initial encounter  Hyperglycemia    ED Discharge Orders        Ordered    doxycycline (VIBRAMYCIN) 100 MG capsule  2 times daily     11/03/17 1351       Ivery Quale, PA-C 11/03/17 1354    Bethann Berkshire, MD 11/03/17 1546

## 2017-11-03 NOTE — ED Notes (Signed)
Pt noted to have a scabbed area to mid shin with small area of controlled bleeding

## 2017-11-03 NOTE — ED Triage Notes (Signed)
Pt from [facility].  Hit her right lower leg on something yesterday.  This morning per staff wound is bleeding.

## 2017-11-08 ENCOUNTER — Ambulatory Visit (INDEPENDENT_AMBULATORY_CARE_PROVIDER_SITE_OTHER): Payer: Medicare Other | Admitting: Orthotics

## 2017-11-08 DIAGNOSIS — L97522 Non-pressure chronic ulcer of other part of left foot with fat layer exposed: Secondary | ICD-10-CM

## 2017-11-08 DIAGNOSIS — E0842 Diabetes mellitus due to underlying condition with diabetic polyneuropathy: Secondary | ICD-10-CM

## 2017-11-08 DIAGNOSIS — Z89432 Acquired absence of left foot: Secondary | ICD-10-CM

## 2017-11-08 NOTE — Progress Notes (Signed)

## 2018-08-26 IMAGING — CR DG FOOT COMPLETE 3+V*L*
3 series · 3 of 3 positions shown · non-contrast
Comparison: No recent prior.

CLINICAL DATA: Plantar nonhealing ulcer.

EXAM:
LEFT FOOT - COMPLETE 3+ VIEW

[x foot ap left]
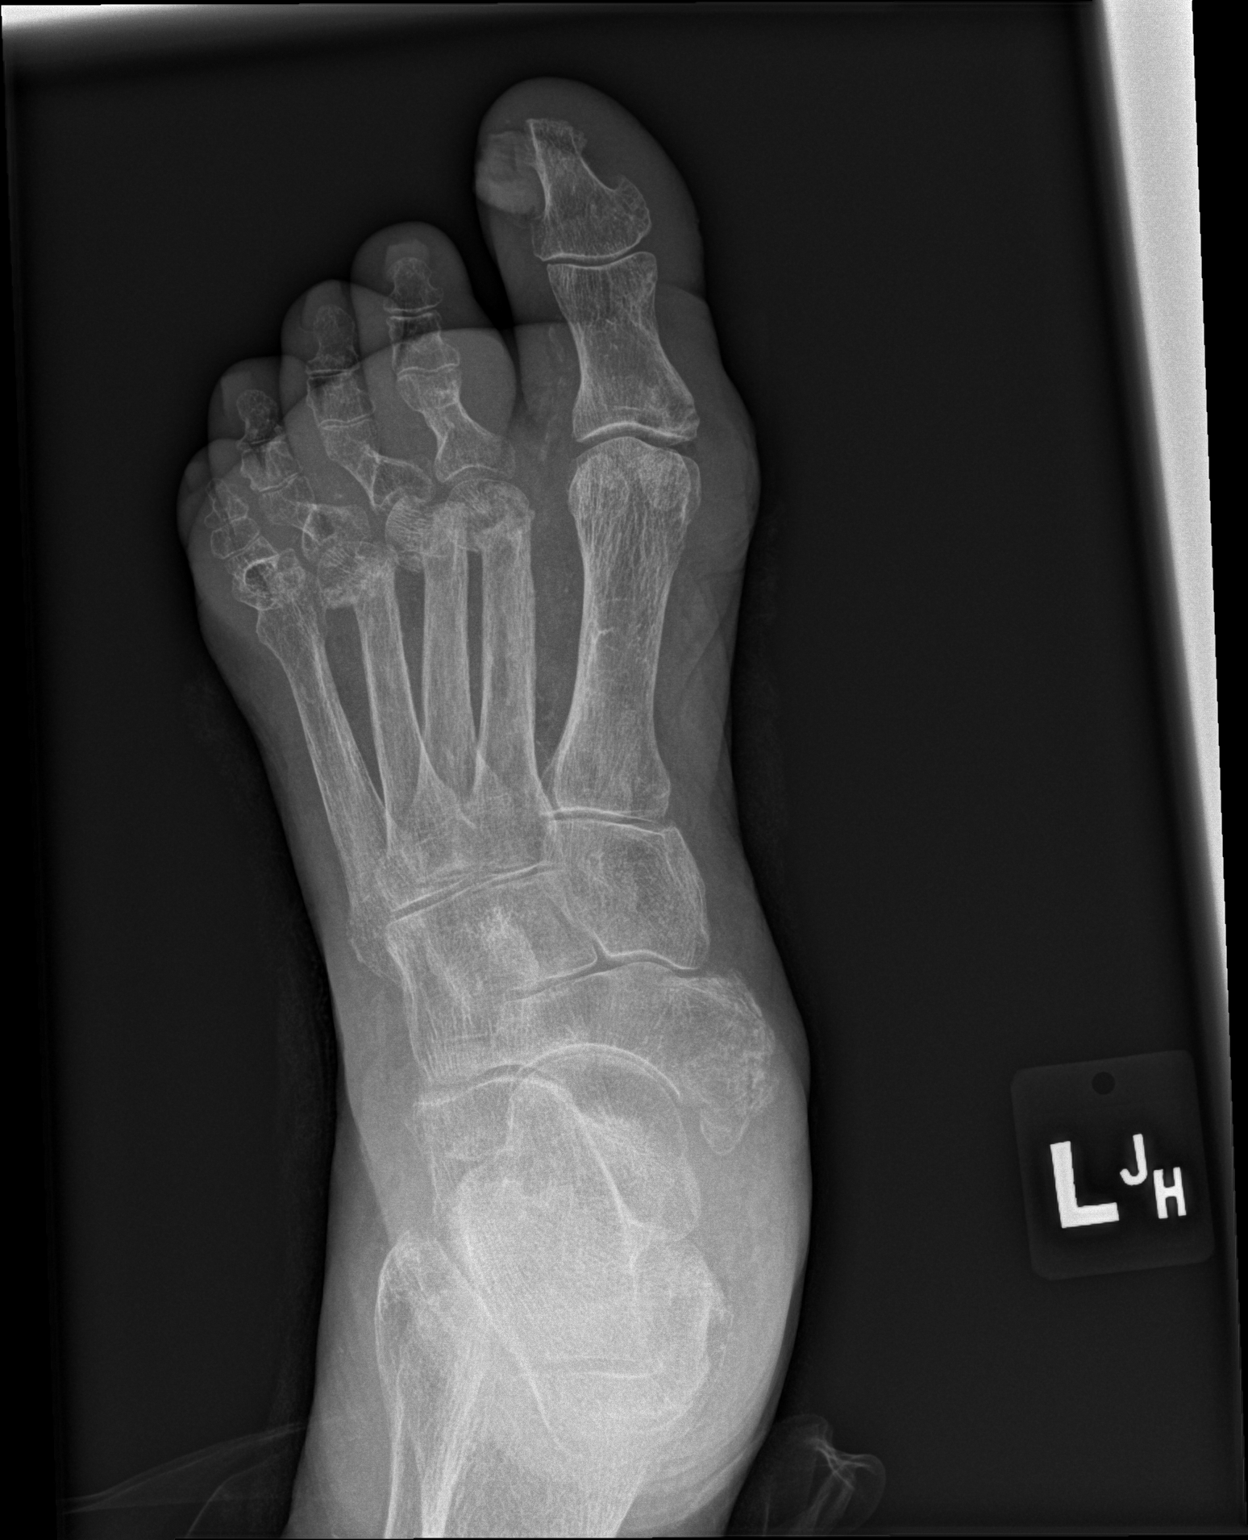

[x foot obl left]
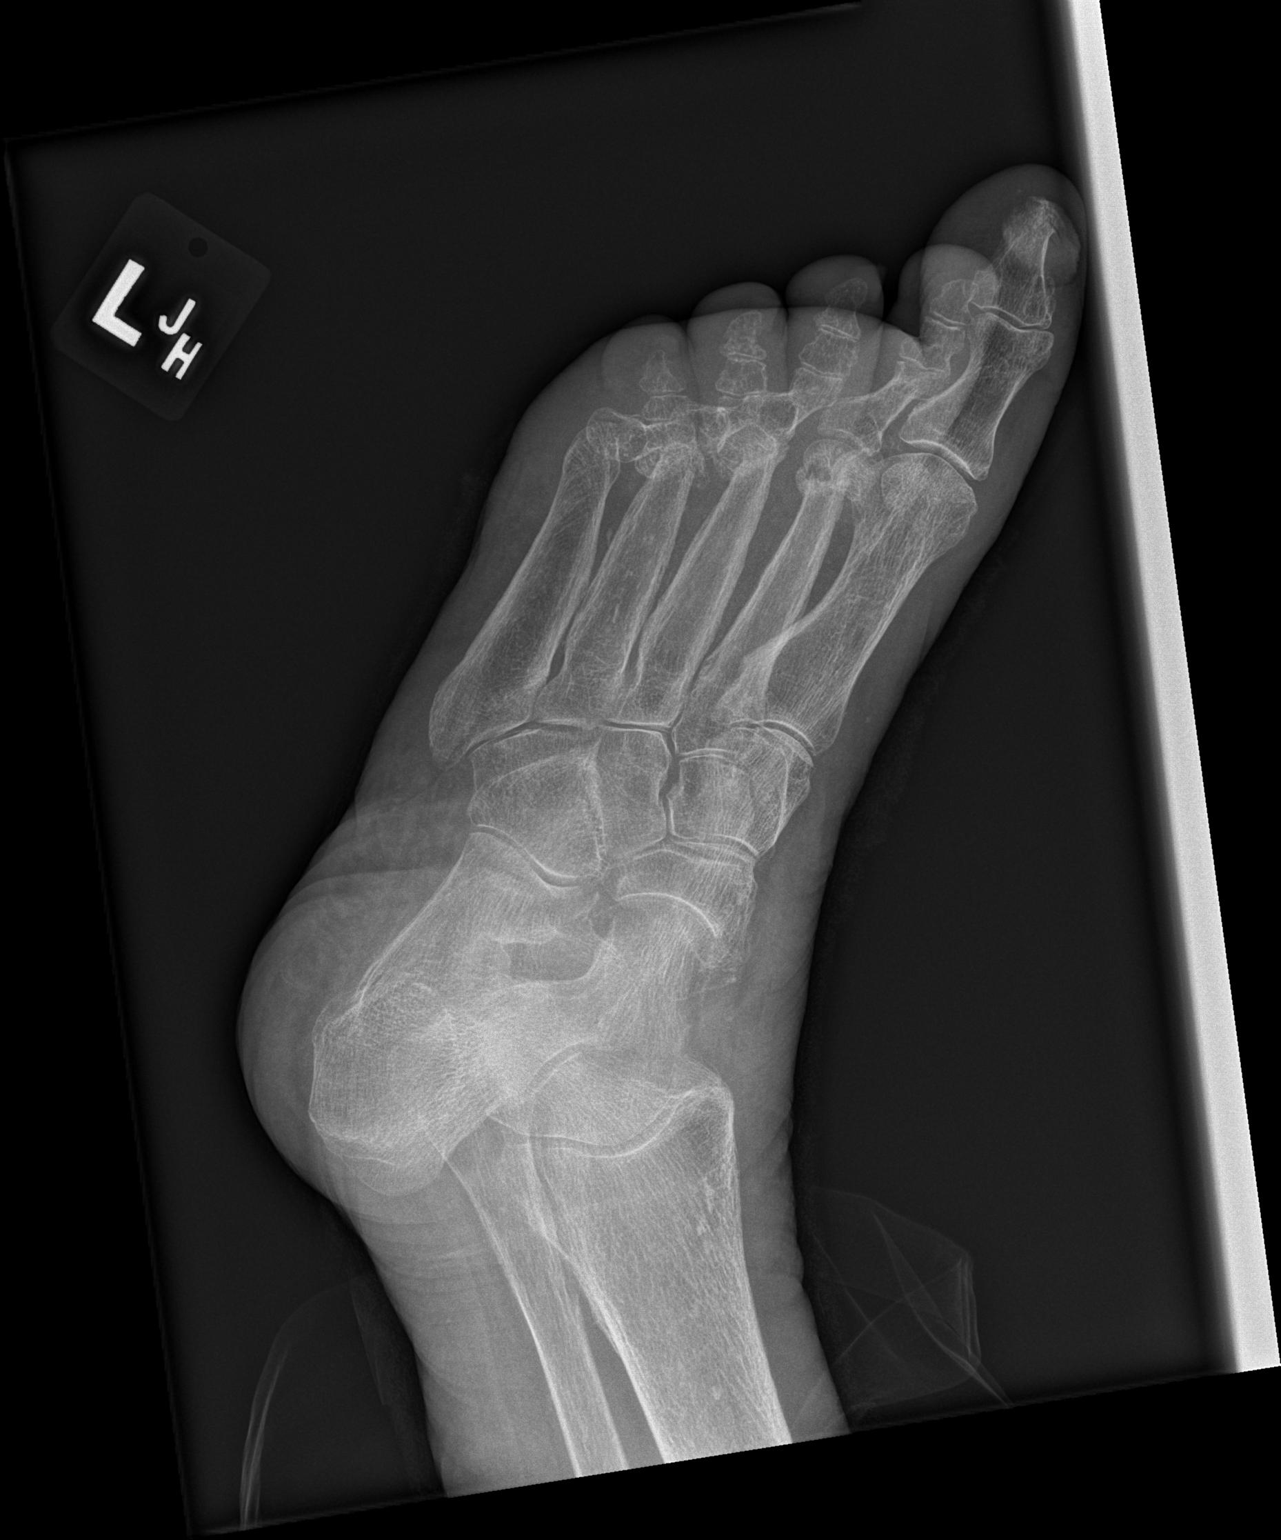

[x foot lat left]
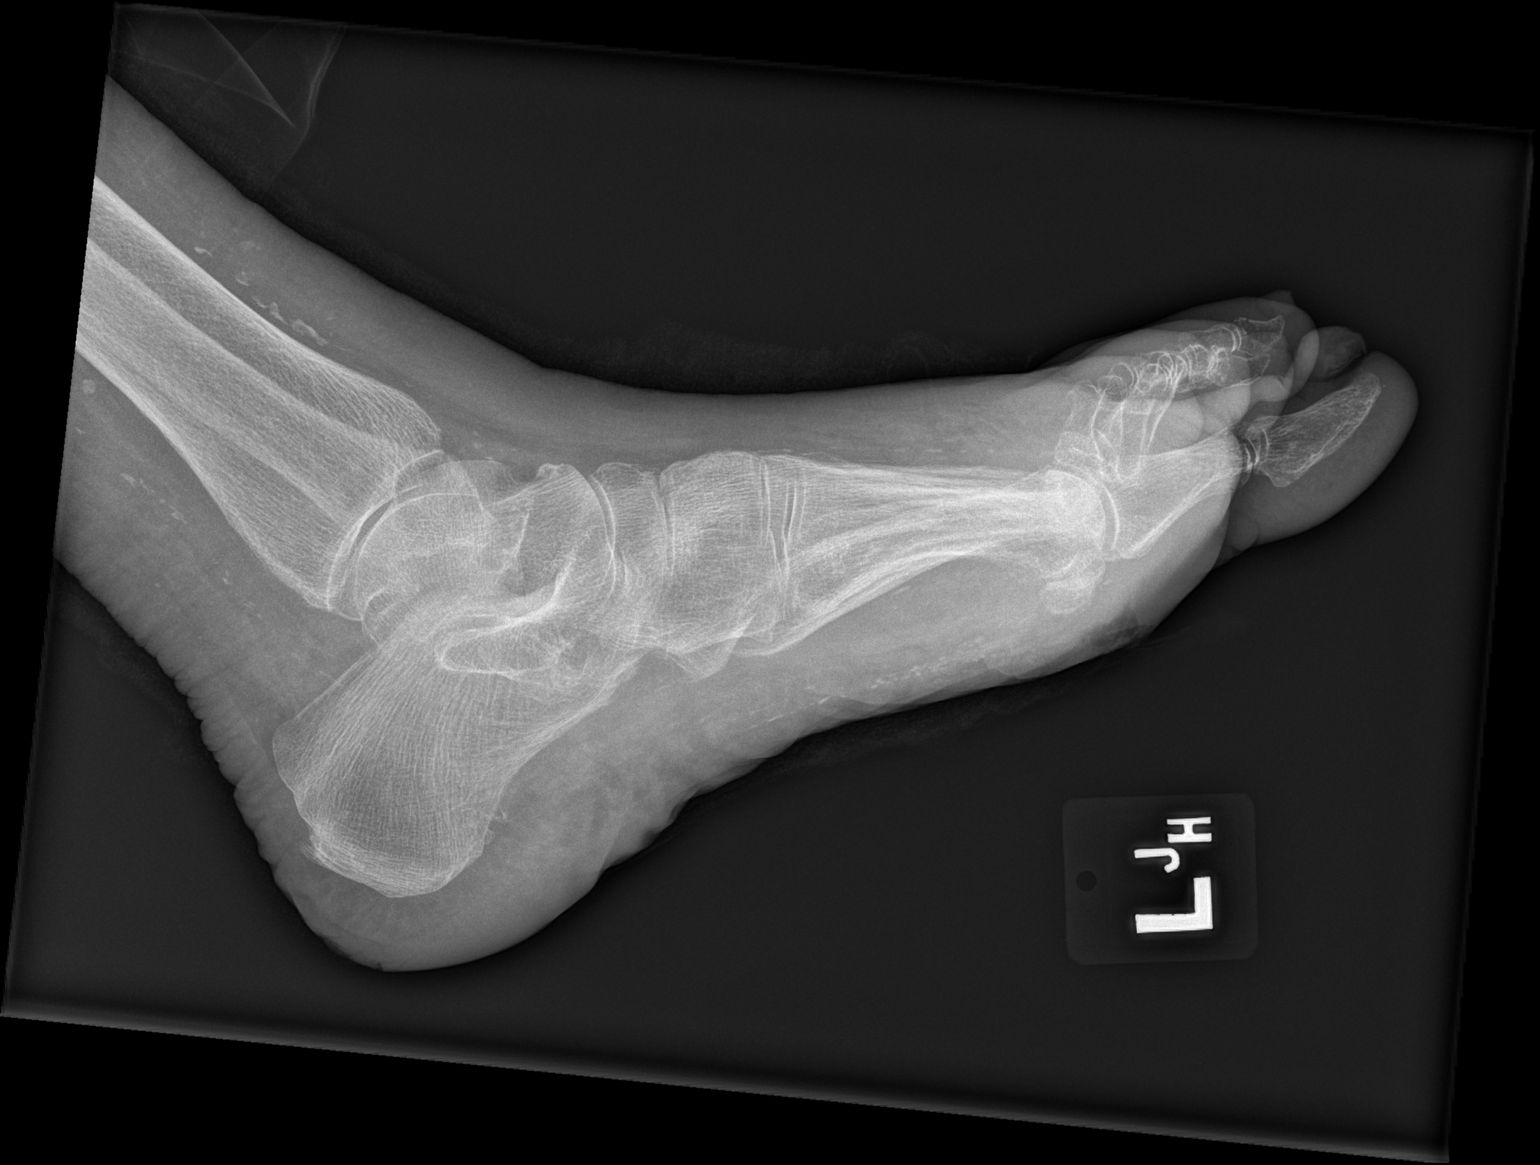

[3 of 3 positions shown; findings below may reference images not displayed]

FINDINGS: Soft tissue ulceration noted along the distal plantar aspect of the
left foot. No radiopaque foreign bodies. Peripheral vascular
calcification. Diffuse osteopenia. Diffuse severe degenerative
changes. Old fractures noted of the distal aspect of the metatarsals
small focal lucency noted of the distal aspect of the left second
metatarsal. A small focus of osteomyelitis cannot be completely
excluded.
IMPRESSION: 1. Soft tissue ulceration noted on the distal plantar aspect of the
left foot. No radiopaque foreign bodies. Peripheral vascular
calcification consistent peripheral vascular disease.

2. Small focal lucency along the distal aspect of the left second
metatarsal noted. A small focus of osteomyelitis cannot be excluded.

3. Diffuse osteopenia, degenerative change. Old fractures of the
distal metatarsals.

## 2018-09-02 IMAGING — CR DG FOOT COMPLETE 3+V*L*
3 series · 3 of 3 positions shown · non-contrast
Comparison: Left foot MRI performed 10/10/2016, and left foot
radiographs performed 10/05/2016

CLINICAL DATA: Postoperative radiograph, status post amputation at
the left foot. Initial encounter.

EXAM:
LEFT FOOT - COMPLETE 3+ VIEW

[AP]
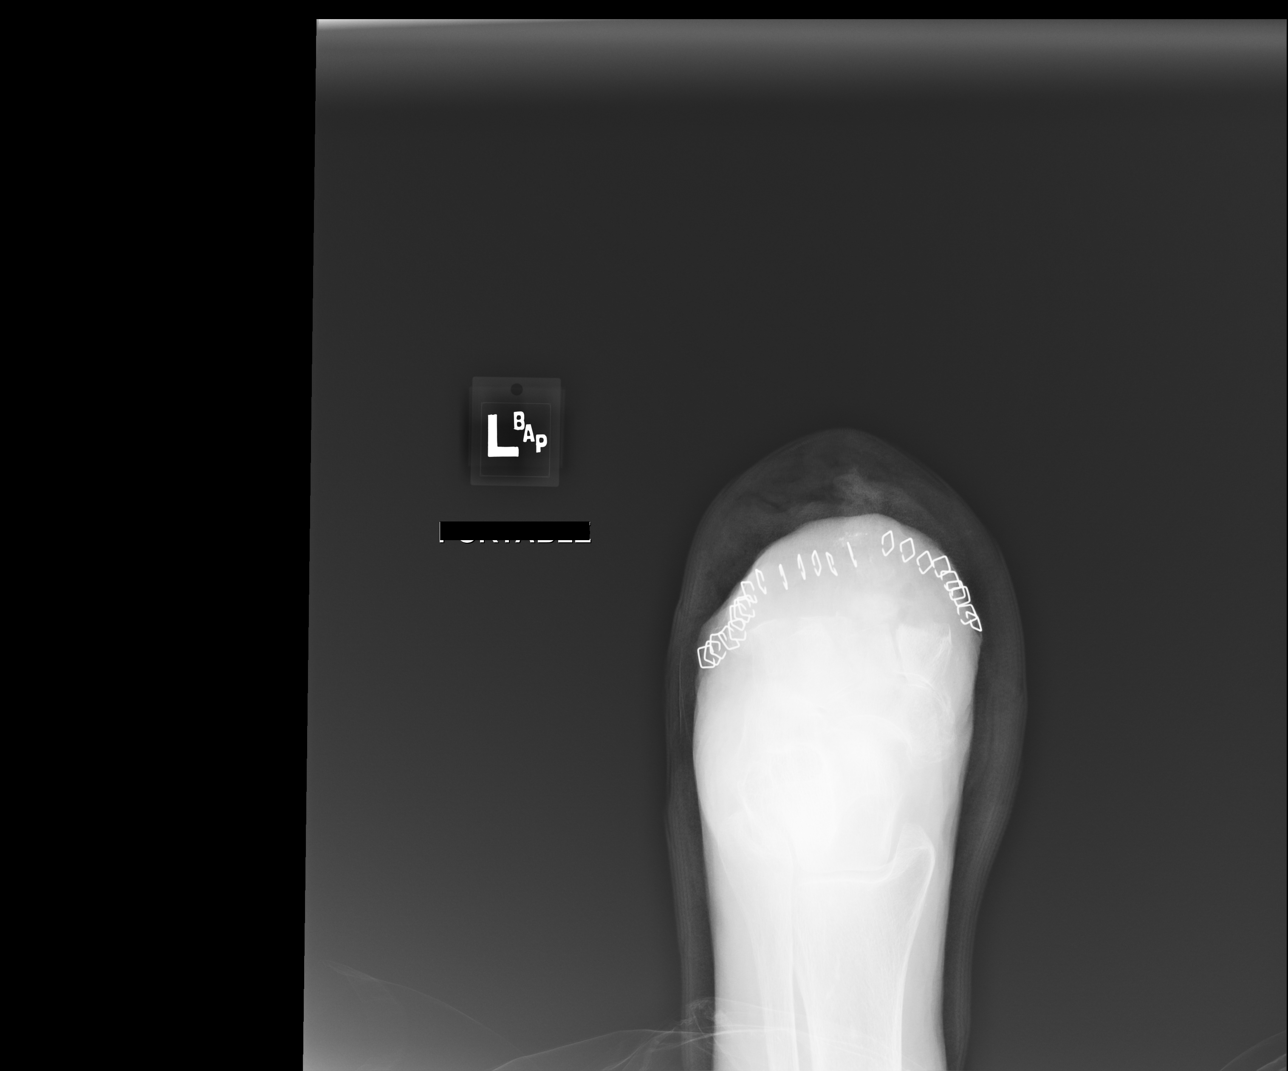

[ap obl int rot]
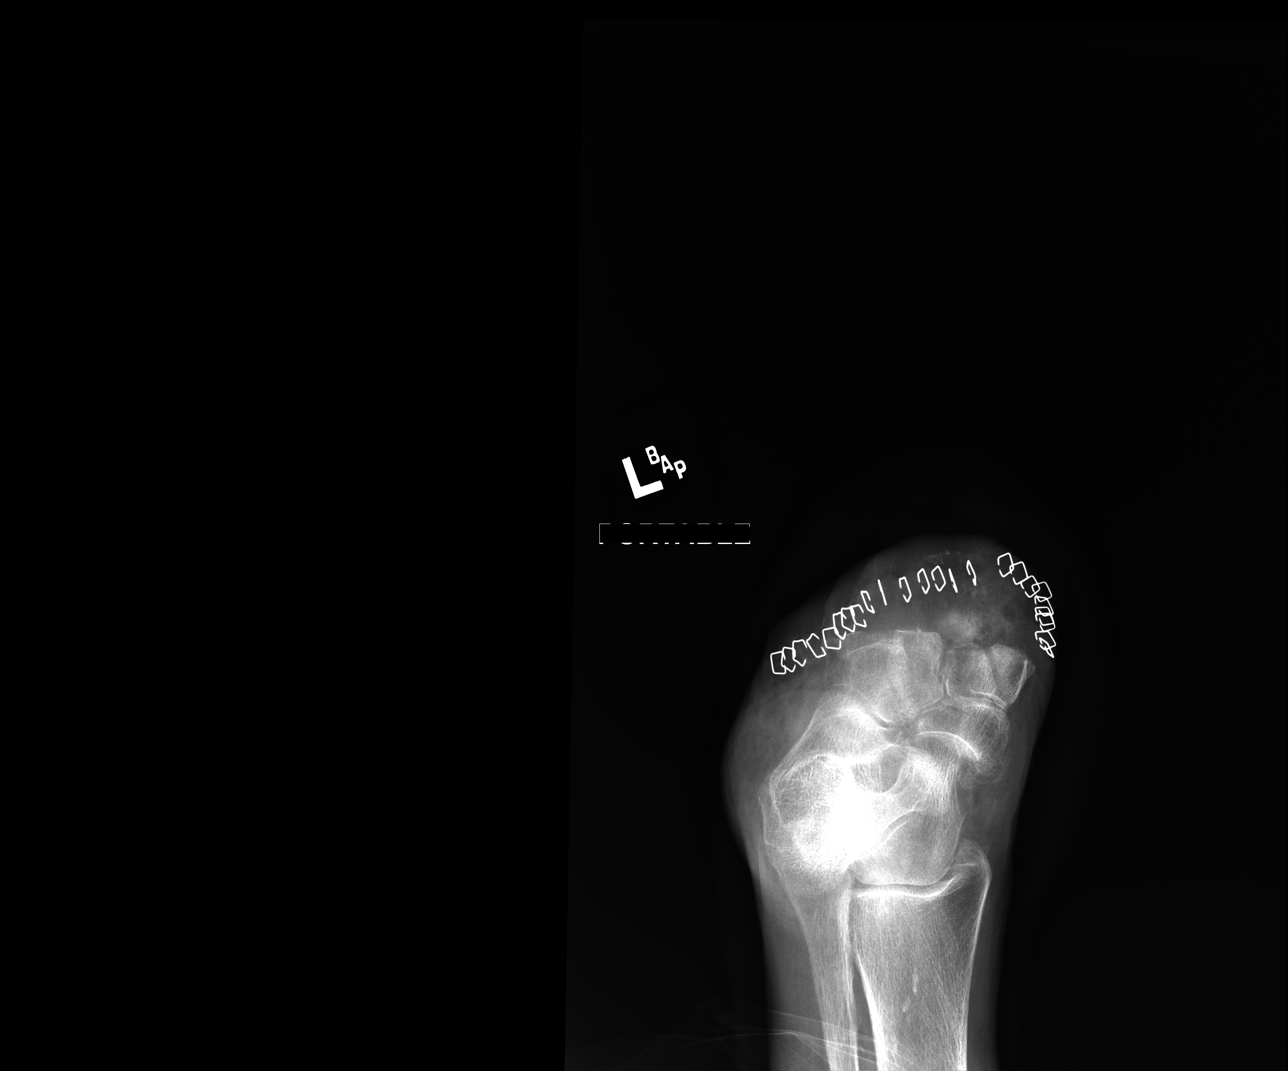

[lateral]
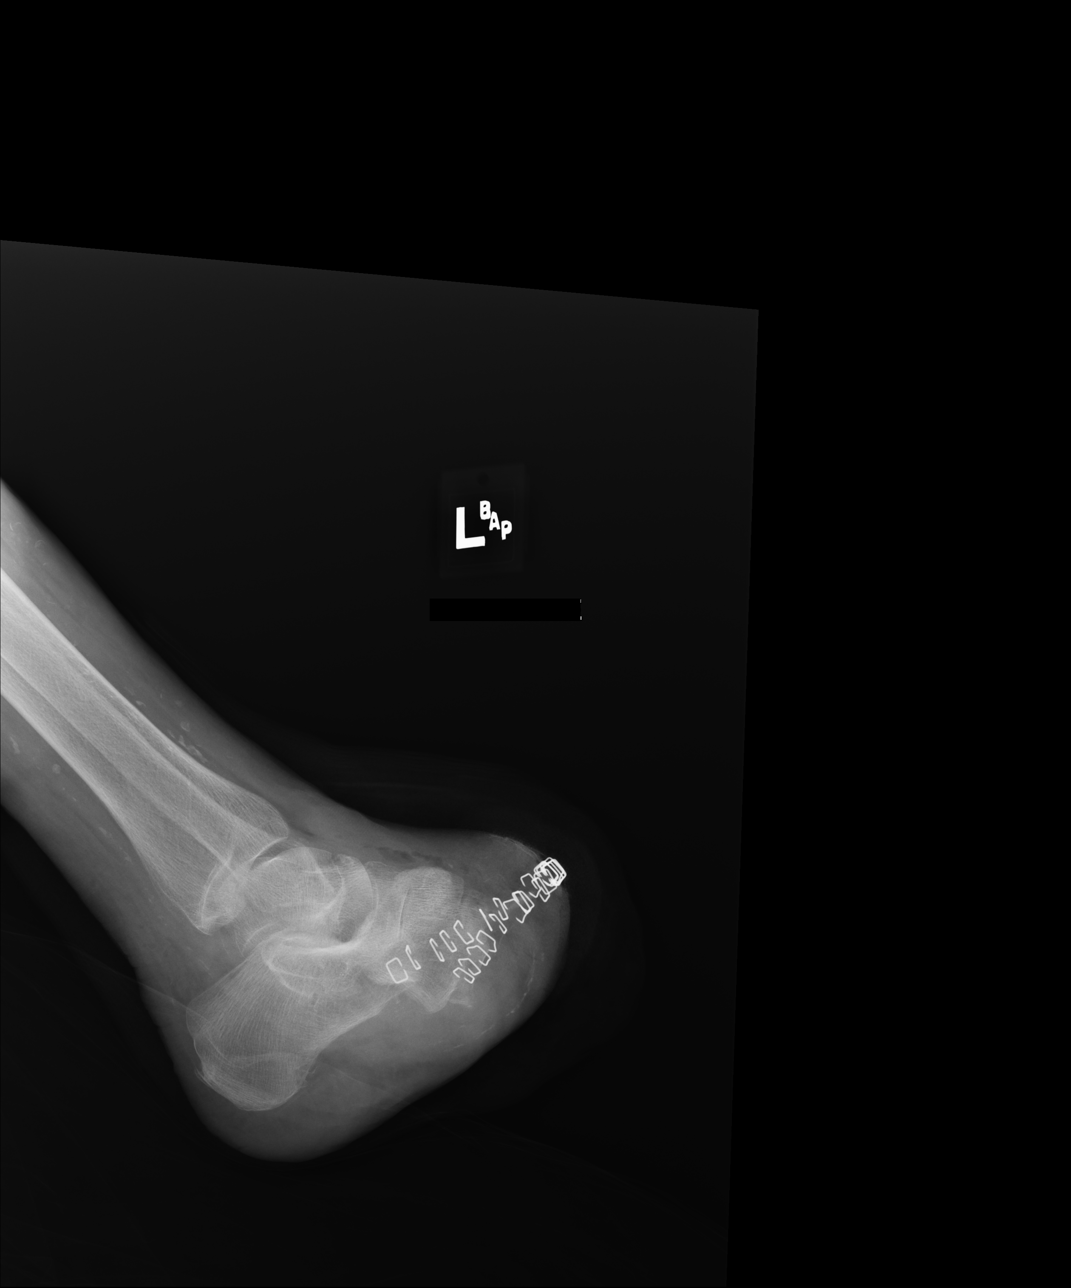

[3 of 3 positions shown; findings below may reference images not displayed]

FINDINGS: The patient is status post amputation across the midfoot, including
portions of the cuneiforms, with overlying postoperative soft tissue
swelling. Associated skin staples are seen. Scattered postoperative
soft tissue air tracks about the dorsal midfoot and ankle.

Scattered vascular calcifications are seen.
IMPRESSION: Status post amputation across the midfoot.

## 2018-10-04 ENCOUNTER — Other Ambulatory Visit: Payer: Self-pay

## 2018-10-04 NOTE — Patient Outreach (Signed)
[facility]) Care Management  10/04/2018  Tara Flores 10/04/1935 [id number]   Medication Adherence call to Sheilah Mins HIPPA Compliant Voice message left with a call back number. [name] is showing past due on Lisinopril 2.5 mg under [facility] Ins.   Lillia Abed CPhT Pharmacy Technician [facility] Care Management Direct Dial [phone]  Fax [phone] [email or web address]

## 2019-06-22 ENCOUNTER — Telehealth: Payer: Self-pay

## 2019-06-22 NOTE — Telephone Encounter (Signed)
Palliative Care referral received from Wynell Balloon NP.

## 2019-08-21 DIAGNOSIS — H547 Unspecified visual loss: Secondary | ICD-10-CM | POA: Diagnosis not present

## 2019-08-21 DIAGNOSIS — R278 Other lack of coordination: Secondary | ICD-10-CM | POA: Diagnosis not present

## 2019-09-14 DIAGNOSIS — I5189 Other ill-defined heart diseases: Secondary | ICD-10-CM | POA: Diagnosis not present

## 2019-09-14 DIAGNOSIS — I4891 Unspecified atrial fibrillation: Secondary | ICD-10-CM | POA: Diagnosis not present

## 2019-09-14 DIAGNOSIS — E119 Type 2 diabetes mellitus without complications: Secondary | ICD-10-CM | POA: Diagnosis not present

## 2019-09-14 DIAGNOSIS — J449 Chronic obstructive pulmonary disease, unspecified: Secondary | ICD-10-CM | POA: Diagnosis not present

## 2019-09-14 DIAGNOSIS — I10 Essential (primary) hypertension: Secondary | ICD-10-CM | POA: Diagnosis not present

## 2019-09-22 DIAGNOSIS — J449 Chronic obstructive pulmonary disease, unspecified: Secondary | ICD-10-CM | POA: Diagnosis not present

## 2019-09-22 DIAGNOSIS — M6281 Muscle weakness (generalized): Secondary | ICD-10-CM | POA: Diagnosis not present

## 2019-09-25 DIAGNOSIS — I4891 Unspecified atrial fibrillation: Secondary | ICD-10-CM | POA: Diagnosis not present

## 2019-09-25 DIAGNOSIS — I872 Venous insufficiency (chronic) (peripheral): Secondary | ICD-10-CM | POA: Diagnosis not present

## 2019-09-25 DIAGNOSIS — M6281 Muscle weakness (generalized): Secondary | ICD-10-CM | POA: Diagnosis not present

## 2019-09-25 DIAGNOSIS — J449 Chronic obstructive pulmonary disease, unspecified: Secondary | ICD-10-CM | POA: Diagnosis not present

## 2019-09-25 DIAGNOSIS — I5032 Chronic diastolic (congestive) heart failure: Secondary | ICD-10-CM | POA: Diagnosis not present

## 2019-09-25 DIAGNOSIS — E1165 Type 2 diabetes mellitus with hyperglycemia: Secondary | ICD-10-CM | POA: Diagnosis not present

## 2019-09-26 DIAGNOSIS — M6281 Muscle weakness (generalized): Secondary | ICD-10-CM | POA: Diagnosis not present

## 2019-09-26 DIAGNOSIS — J449 Chronic obstructive pulmonary disease, unspecified: Secondary | ICD-10-CM | POA: Diagnosis not present

## 2019-09-27 DIAGNOSIS — J449 Chronic obstructive pulmonary disease, unspecified: Secondary | ICD-10-CM | POA: Diagnosis not present

## 2019-09-27 DIAGNOSIS — M6281 Muscle weakness (generalized): Secondary | ICD-10-CM | POA: Diagnosis not present

## 2019-09-28 DIAGNOSIS — J449 Chronic obstructive pulmonary disease, unspecified: Secondary | ICD-10-CM | POA: Diagnosis not present

## 2019-09-28 DIAGNOSIS — M6281 Muscle weakness (generalized): Secondary | ICD-10-CM | POA: Diagnosis not present

## 2019-09-29 DIAGNOSIS — M6281 Muscle weakness (generalized): Secondary | ICD-10-CM | POA: Diagnosis not present

## 2019-09-29 DIAGNOSIS — J449 Chronic obstructive pulmonary disease, unspecified: Secondary | ICD-10-CM | POA: Diagnosis not present

## 2019-10-02 DIAGNOSIS — M6281 Muscle weakness (generalized): Secondary | ICD-10-CM | POA: Diagnosis not present

## 2019-10-02 DIAGNOSIS — J449 Chronic obstructive pulmonary disease, unspecified: Secondary | ICD-10-CM | POA: Diagnosis not present

## 2019-10-03 DIAGNOSIS — J449 Chronic obstructive pulmonary disease, unspecified: Secondary | ICD-10-CM | POA: Diagnosis not present

## 2019-10-03 DIAGNOSIS — M6281 Muscle weakness (generalized): Secondary | ICD-10-CM | POA: Diagnosis not present

## 2019-10-04 DIAGNOSIS — M6281 Muscle weakness (generalized): Secondary | ICD-10-CM | POA: Diagnosis not present

## 2019-10-04 DIAGNOSIS — J449 Chronic obstructive pulmonary disease, unspecified: Secondary | ICD-10-CM | POA: Diagnosis not present

## 2019-10-05 DIAGNOSIS — J449 Chronic obstructive pulmonary disease, unspecified: Secondary | ICD-10-CM | POA: Diagnosis not present

## 2019-10-05 DIAGNOSIS — M6281 Muscle weakness (generalized): Secondary | ICD-10-CM | POA: Diagnosis not present

## 2019-10-06 DIAGNOSIS — M6281 Muscle weakness (generalized): Secondary | ICD-10-CM | POA: Diagnosis not present

## 2019-10-06 DIAGNOSIS — J449 Chronic obstructive pulmonary disease, unspecified: Secondary | ICD-10-CM | POA: Diagnosis not present

## 2019-10-09 DIAGNOSIS — M6281 Muscle weakness (generalized): Secondary | ICD-10-CM | POA: Diagnosis not present

## 2019-10-09 DIAGNOSIS — J449 Chronic obstructive pulmonary disease, unspecified: Secondary | ICD-10-CM | POA: Diagnosis not present

## 2019-10-10 DIAGNOSIS — M6281 Muscle weakness (generalized): Secondary | ICD-10-CM | POA: Diagnosis not present

## 2019-10-10 DIAGNOSIS — J449 Chronic obstructive pulmonary disease, unspecified: Secondary | ICD-10-CM | POA: Diagnosis not present

## 2019-10-11 DIAGNOSIS — M19071 Primary osteoarthritis, right ankle and foot: Secondary | ICD-10-CM | POA: Diagnosis not present

## 2019-10-11 DIAGNOSIS — M25571 Pain in right ankle and joints of right foot: Secondary | ICD-10-CM | POA: Diagnosis not present

## 2019-10-11 DIAGNOSIS — M6281 Muscle weakness (generalized): Secondary | ICD-10-CM | POA: Diagnosis not present

## 2019-10-11 DIAGNOSIS — E119 Type 2 diabetes mellitus without complications: Secondary | ICD-10-CM | POA: Diagnosis not present

## 2019-10-11 DIAGNOSIS — I10 Essential (primary) hypertension: Secondary | ICD-10-CM | POA: Diagnosis not present

## 2019-10-11 DIAGNOSIS — J439 Emphysema, unspecified: Secondary | ICD-10-CM | POA: Diagnosis not present

## 2019-10-11 DIAGNOSIS — J449 Chronic obstructive pulmonary disease, unspecified: Secondary | ICD-10-CM | POA: Diagnosis not present

## 2019-10-11 DIAGNOSIS — I4891 Unspecified atrial fibrillation: Secondary | ICD-10-CM | POA: Diagnosis not present

## 2019-10-11 DIAGNOSIS — I5189 Other ill-defined heart diseases: Secondary | ICD-10-CM | POA: Diagnosis not present

## 2019-10-11 DIAGNOSIS — S92014A Nondisplaced fracture of body of right calcaneus, initial encounter for closed fracture: Secondary | ICD-10-CM | POA: Diagnosis not present

## 2019-10-13 ENCOUNTER — Other Ambulatory Visit: Payer: Self-pay

## 2019-10-13 ENCOUNTER — Ambulatory Visit (INDEPENDENT_AMBULATORY_CARE_PROVIDER_SITE_OTHER): Payer: Medicare Other | Admitting: Family Medicine

## 2019-10-13 ENCOUNTER — Encounter: Payer: Self-pay | Admitting: Family Medicine

## 2019-10-13 DIAGNOSIS — S92011A Displaced fracture of body of right calcaneus, initial encounter for closed fracture: Secondary | ICD-10-CM

## 2019-10-13 NOTE — Progress Notes (Signed)
Walking with PT at the nursing home and heard a pop This happened two days ago

## 2019-10-13 NOTE — Progress Notes (Signed)
Office Visit Note   Patient: Tara Flores           Date of Birth: 03/03/1936           MRN: [id number] Visit Date: 10/13/2019 Requested by: Bernerd Limbo, MD [address] [place],  [place] [address] PCP: Bernerd Limbo, MD  Subjective: Chief Complaint  Patient presents with  . Right Foot - Pain    HPI: She is here with right calcaneus fracture.  2 days ago she was at her nursing home where they were working with her trying to help her walk again.  She was using a walker for support and when she stepped, she felt something pop in her foot.  She sat down into the wheelchair behind her and has been unable to bear weight since then.  She had x-rays obtained which were read as showing a mildly displaced fracture of the posterior calcaneus with osteopenia.  I do not have those films for review but I do have the radiologist report.  No previous problems with this foot.  She has a history of right hip fracture ORIF secondary to a fall several years ago.  She has a history of left foot transmetatarsal amputation for infection.  She has peripheral vascular disease, she is diabetic and she smokes cigarettes with no intention of quitting.  I asked her why she is not able to walk prior to this fracture, she is unable to remember.               ROS:   All other systems were reviewed and are negative.  Objective: Vital Signs: There were no vitals taken for this visit.  Physical Exam:  General:  Alert and oriented, in no acute distress. Pulm:  Breathing unlabored. Psy:  Normal mood, congruent affect. Skin: There is erythema of her right leg with 1+ edema from mid shin down to her foot.   Right foot: She is tender to palpation around the calcaneus.  She is able to plantarflex the foot against resistance with intact Achilles function.  She has no pain with ankle dorsiflexion or eversion against resistance.  The pulses in both of her ankles are faint.  She has sluggish capillary  refill in her toes.  Imaging: No results found.  Assessment & Plan: 1.  2 days status post right calcaneus fracture with probable osteoporosis.  Very high risk surgical candidate with all of her underlying medical issues. -She is already on Eliquis for anticoagulation. -= We will treat with a short fracture boot, weightbearing only for transfers. -Return in about 2 weeks for two-view calcaneus x-rays.  If alignment is stable, we may allow her to start bearing more weight. -We discussed the fact that smoking significantly increases her risk of delayed fracture healing.  We also discussed the fact that diabetes and peripheral vascular disease can affect bone healing.     Procedures: No procedures performed  No notes on file     PMFS History: Patient Active Problem List   Diagnosis Date Noted  . Tobacco use disorder 02/10/2017  . Pulmonary emphysema (HCC)   . Acute on chronic diastolic (congestive) heart failure (HCC) 02/09/2017  . Type 2 diabetes mellitus with hypoglycemia (HCC) 02/09/2017  . New onset a-fib (HCC) 11/20/2016  . Atrial fibrillation with RVR (HCC)   . COPD exacerbation (HCC)   . Goals of care, counseling/discussion   . Palliative care by specialist   . TIA (transient ischemic attack) 11/19/2016  .  Diabetic foot ulcer ([place]) 10/10/2016  . Hypokalemia 10/10/2016  . Anemia due to chronic kidney disease 10/10/2016  . Cellulitis in diabetic foot ([place]) 10/09/2016  . Venous ulcer of left leg ([place]) 07/28/2016  . Dry skin dermatitis 07/28/2016  . Type 2 diabetes mellitus with hyperglycemia ([place]) 02/06/2015  . Hip fracture ([place]) 02/05/2015  . Closed right hip fracture ([place]) 02/05/2015  . Abnormal EKG 02/05/2015  . Tobacco abuse 02/05/2015  . DM2 (diabetes mellitus, type 2) ([place]) 02/05/2015  . Essential hypertension 02/05/2015  . Hypothyroidism 02/05/2015  . Coronary artery disease involving native coronary artery of native heart without angina pectoris    Past  Medical History:  Diagnosis Date  . Acquired absence of left foot ([place])   . Anemia in chronic kidney disease   . Anxiety   . Arthritis   . Asthma   . Atrial fibrillation ([place])   . CAD (coronary artery disease)   . Cardiac disease   . CHF (congestive heart failure) ([place])   . CHF (congestive heart failure) ([place])   . COPD (chronic obstructive pulmonary disease) ([place])   . Depression   . Diabetes mellitus without complication ([place])   . Diabetic neuropathy ([place])   . Emphysema of lung ([place])   . Hyperlipidemia   . Hypertension   . Hypothyroidism   . Mild cognitive impairment   . TIA (transient ischemic attack)     Family History  Problem Relation Age of Onset  . Alcohol abuse Father   . Cancer Mother        lung  . Heart disease Mother   . Diabetes Sister   . Heart disease Sister     Past Surgical History:  Procedure Laterality Date  . AMPUTATION Left 10/12/2016   Procedure: TRANSMETATARSAL AMPUTATION LEFT FOOT WITH ACHILLES TENDON LENGTHENING;  Surgeon: Edrick Kins, DPM;  Location: [place] [place];  Service: Podiatry;  Laterality: Left;  . APPENDECTOMY    . CARDIAC CATHETERIZATION N/A 02/05/2015   Procedure: Left Heart Cath and Coronary Angiography;  Surgeon: Burnell Blanks, MD;  Location: [facility] CV LAB;  Service: Cardiovascular;  Laterality: N/A;  . EYE SURGERY  2006   unsure if exact procedure  . HIP FRACTURE SURGERY    . INTRAMEDULLARY (IM) NAIL INTERTROCHANTERIC Right 02/06/2015   Procedure: INTRAMEDULLARY (IM) NAIL RIGHT HIP;  Surgeon: Leandrew Koyanagi, MD;  Location: [facility];  Service: Orthopedics;  Laterality: Right;  . JOINT REPLACEMENT     due to hip fracture   Social History   Occupational History  . Not on file  Tobacco Use  . Smoking status: Current Every Day Smoker    Packs/day: 1.00    Years: 63.00    Pack years: 63.00    Types: Cigarettes  . Smokeless tobacco: Never Used  Substance and Sexual Activity  . Alcohol use: Yes    Alcohol/week: 0.0 standard  drinks    Comment: 02/09/2017 "used to have a glass of wine at night; stopped cause I take so many pills"  . Drug use: No  . Sexual activity: Never

## 2019-10-19 DIAGNOSIS — J449 Chronic obstructive pulmonary disease, unspecified: Secondary | ICD-10-CM | POA: Diagnosis not present

## 2019-10-19 DIAGNOSIS — M6281 Muscle weakness (generalized): Secondary | ICD-10-CM | POA: Diagnosis not present

## 2019-10-24 DIAGNOSIS — L602 Onychogryphosis: Secondary | ICD-10-CM | POA: Diagnosis not present

## 2019-10-24 DIAGNOSIS — L84 Corns and callosities: Secondary | ICD-10-CM | POA: Diagnosis not present

## 2019-10-24 DIAGNOSIS — E1151 Type 2 diabetes mellitus with diabetic peripheral angiopathy without gangrene: Secondary | ICD-10-CM | POA: Diagnosis not present

## 2019-10-26 DIAGNOSIS — I872 Venous insufficiency (chronic) (peripheral): Secondary | ICD-10-CM | POA: Diagnosis not present

## 2019-10-27 ENCOUNTER — Encounter: Payer: Self-pay | Admitting: Family Medicine

## 2019-10-27 ENCOUNTER — Ambulatory Visit: Payer: Self-pay

## 2019-10-27 ENCOUNTER — Ambulatory Visit (INDEPENDENT_AMBULATORY_CARE_PROVIDER_SITE_OTHER): Payer: Medicare Other | Admitting: Family Medicine

## 2019-10-27 ENCOUNTER — Other Ambulatory Visit: Payer: Self-pay

## 2019-10-27 DIAGNOSIS — S92011A Displaced fracture of body of right calcaneus, initial encounter for closed fracture: Secondary | ICD-10-CM

## 2019-10-27 NOTE — Progress Notes (Signed)
Office Visit Note   Patient: Tara Flores           Date of Birth: Apr 10, 1936           MRN: [id number] Visit Date: 10/27/2019 Requested by: Bernerd Limbo, MD [address] [place],  [place] [address] PCP: Bernerd Limbo, MD  Subjective: Chief Complaint  Patient presents with  . Right Foot - Fracture, Follow-up    Right heel fracture. In short cam boot. Weight bearing only for transfers. "Feels better than it did, but still a little pain with transfers." Keeps boot on all day and takes off at night.    HPI: She is about 2 weeks status post displaced right calcaneus fracture.  She admits to putting a little more weight on it than she probably should when doing transfers.  She is not having any significant pain when nonweightbearing.              ROS:   All other systems were reviewed and are negative.  Objective: Vital Signs: There were no vitals taken for this visit.  Physical Exam:  General:  Alert and oriented, in no acute distress. Pulm:  Breathing unlabored. Psy:  Normal mood, congruent affect. Skin: No skin breakdown. Right heel: She is mildly tender on the posterior aspect of the calcaneus.  Imaging: XR Os Calcis Right  Result Date: 10/27/2019 X-rays right heel: There is significant proximal displacement of the posterior superior calcaneus fracture compared to previous films.  No significant callus formation yet.   Assessment & Plan: 1.  Further displacement of right calcaneus fracture 2-week status post injury -We had a lengthy discussion about treatment options.  She is adamant that she does not want to pursue surgery.  She understands the risk of a bad outcome with this fracture displacement, but she is also a very high risk surgical candidate. -We will see her back in 2 weeks for two-view x-rays.  She will try to avoid putting any weight on her right leg until then.     Procedures: No procedures performed  No notes on file     PMFS  History: Patient Active Problem List   Diagnosis Date Noted  . Tobacco use disorder 02/10/2017  . Pulmonary emphysema (HCC)   . Acute on chronic diastolic (congestive) heart failure (HCC) 02/09/2017  . Type 2 diabetes mellitus with hypoglycemia (HCC) 02/09/2017  . New onset a-fib (HCC) 11/20/2016  . Atrial fibrillation with RVR (HCC)   . COPD exacerbation (HCC)   . Goals of care, counseling/discussion   . Palliative care by specialist   . TIA (transient ischemic attack) 11/19/2016  . Diabetic foot ulcer (HCC) 10/10/2016  . Hypokalemia 10/10/2016  . Anemia due to chronic kidney disease 10/10/2016  . Cellulitis in diabetic foot (HCC) 10/09/2016  . Venous ulcer of left leg (HCC) 07/28/2016  . Dry skin dermatitis 07/28/2016  . Type 2 diabetes mellitus with hyperglycemia (HCC) 02/06/2015  . Hip fracture (HCC) 02/05/2015  . Closed right hip fracture (HCC) 02/05/2015  . Abnormal EKG 02/05/2015  . Tobacco abuse 02/05/2015  . DM2 (diabetes mellitus, type 2) (HCC) 02/05/2015  . Essential hypertension 02/05/2015  . Hypothyroidism 02/05/2015  . Coronary artery disease involving native coronary artery of native heart without angina pectoris    Past Medical History:  Diagnosis Date  . Acquired absence of left foot (HCC)   . Anemia in chronic kidney disease   . Anxiety   . Arthritis   . Asthma   .  Atrial fibrillation ([place] [facility])   . CAD (coronary artery disease)   . Cardiac disease   . CHF (congestive heart failure) ([place])   . CHF (congestive heart failure) ([place])   . COPD (chronic obstructive pulmonary disease) ([facility])   . Depression   . Diabetes mellitus without complication ([place])   . Diabetic neuropathy ([place])   . Emphysema of lung ([place])   . Hyperlipidemia   . Hypertension   . Hypothyroidism   . Mild cognitive impairment   . TIA (transient ischemic attack)     Family History  Problem Relation Age of Onset  . Alcohol abuse Father   . Cancer Mother        lung  . Heart disease Mother    . Diabetes Sister   . Heart disease Sister     Past Surgical History:  Procedure Laterality Date  . AMPUTATION Left 10/12/2016   Procedure: TRANSMETATARSAL AMPUTATION LEFT FOOT WITH ACHILLES TENDON LENGTHENING;  Surgeon: Edrick Kins, DPM;  Location: [facility];  Service: Podiatry;  Laterality: Left;  . APPENDECTOMY    . CARDIAC CATHETERIZATION N/A 02/05/2015   Procedure: Left Heart Cath and Coronary Angiography;  Surgeon: Burnell Blanks, MD;  Location: [facility];  Service: Cardiovascular;  Laterality: N/A;  . EYE SURGERY  2006   unsure if exact procedure  . HIP FRACTURE SURGERY    . INTRAMEDULLARY (IM) NAIL INTERTROCHANTERIC Right 02/06/2015   Procedure: INTRAMEDULLARY (IM) NAIL RIGHT HIP;  Surgeon: Leandrew Koyanagi, MD;  Location: [facility];  Service: Orthopedics;  Laterality: Right;  . JOINT REPLACEMENT     due to hip fracture   Social History   Occupational History  . Not on file  Tobacco Use  . Smoking status: Current Every Day Smoker    Packs/day: 1.00    Years: 63.00    Pack years: 63.00    Types: Cigarettes  . Smokeless tobacco: Never Used  Substance and Sexual Activity  . Alcohol use: Yes    Alcohol/week: 0.0 standard drinks    Comment: 02/09/2017 "used to have a glass of wine at night; stopped cause I take so many pills"  . Drug use: No  . Sexual activity: Never

## 2019-10-31 DIAGNOSIS — E785 Hyperlipidemia, unspecified: Secondary | ICD-10-CM | POA: Diagnosis not present

## 2019-10-31 DIAGNOSIS — I4891 Unspecified atrial fibrillation: Secondary | ICD-10-CM | POA: Diagnosis not present

## 2019-10-31 DIAGNOSIS — I872 Venous insufficiency (chronic) (peripheral): Secondary | ICD-10-CM | POA: Diagnosis not present

## 2019-10-31 DIAGNOSIS — E039 Hypothyroidism, unspecified: Secondary | ICD-10-CM | POA: Diagnosis not present

## 2019-11-02 ENCOUNTER — Other Ambulatory Visit: Payer: Self-pay

## 2019-11-10 ENCOUNTER — Ambulatory Visit (INDEPENDENT_AMBULATORY_CARE_PROVIDER_SITE_OTHER): Payer: Medicare Other | Admitting: Family Medicine

## 2019-11-10 ENCOUNTER — Other Ambulatory Visit: Payer: Self-pay

## 2019-11-10 ENCOUNTER — Ambulatory Visit (INDEPENDENT_AMBULATORY_CARE_PROVIDER_SITE_OTHER): Payer: Medicare Other

## 2019-11-10 ENCOUNTER — Encounter: Payer: Self-pay | Admitting: Family Medicine

## 2019-11-10 DIAGNOSIS — S92011A Displaced fracture of body of right calcaneus, initial encounter for closed fracture: Secondary | ICD-10-CM

## 2019-11-10 NOTE — Addendum Note (Signed)
Addended by: Lillia Carmel on: 11/10/2019 01:34 PM   Modules accepted: Level of Service

## 2019-11-10 NOTE — Progress Notes (Signed)
Office Visit Note   Patient: Tara Flores           Date of Birth: [birth year]           MRN: [id number] Visit Date: 11/10/2019 Requested by: Bernerd Limbo, MD [address] [place],  [place] [address] PCP: Bernerd Limbo, MD  Subjective: Chief Complaint  Patient presents with  . Right Foot - Follow-up    HPI: She is about a month status post right calcaneus fracture with displacement.  Since last visit she has been touchdown weightbearing for transfers but otherwise nonweightbearing.  Wearing her fracture boot all day and taking it off to sleep at night.  She feels like she is pain-free now.              ROS:   All other systems were reviewed and are negative.  Objective: Vital Signs: There were no vitals taken for this visit.  Physical Exam:  General:  Alert and oriented, in no acute distress. Pulm:  Breathing unlabored. Psy:  Normal mood, congruent affect. Skin: No breakdown. Right heel: No significant tenderness.  Imaging: XR Os Calcis Right  Result Date: 11/10/2019 X-rays right heel: No further displacement of the calcaneus fracture.  I believe she has some early callus formation present.   Assessment & Plan: 1.  Clinically healing 4 weeks status post right heel displaced calcaneus fracture -Continue with touchdown weightbearing for 3 more weeks, okay to remove fracture boot when nonweightbearing. -Return in 3 weeks for two-view os calcis x-ray and examination.  If clinically healing, we will allow her to start bearing more weight.     Procedures: No procedures performed  No notes on file     PMFS History: Patient Active Problem List   Diagnosis Date Noted  . Tobacco use disorder 02/10/2017  . Pulmonary emphysema (HCC)   . Acute on chronic diastolic (congestive) heart failure (HCC) 02/09/2017  . Type 2 diabetes mellitus with hypoglycemia (HCC) 02/09/2017  . New onset a-fib (HCC) 11/20/2016  . Atrial fibrillation with RVR (HCC)   .  COPD exacerbation (HCC)   . Goals of care, counseling/discussion   . Palliative care by specialist   . TIA (transient ischemic attack) 11/19/2016  . Diabetic foot ulcer (HCC) 10/10/2016  . Hypokalemia 10/10/2016  . Anemia due to chronic kidney disease 10/10/2016  . Cellulitis in diabetic foot (HCC) 10/09/2016  . Venous ulcer of left leg (HCC) 07/28/2016  . Dry skin dermatitis 07/28/2016  . Type 2 diabetes mellitus with hyperglycemia (HCC) 02/06/2015  . Hip fracture (HCC) 02/05/2015  . Closed right hip fracture (HCC) 02/05/2015  . Abnormal EKG 02/05/2015  . Tobacco abuse 02/05/2015  . DM2 (diabetes mellitus, type 2) (HCC) 02/05/2015  . Essential hypertension 02/05/2015  . Hypothyroidism 02/05/2015  . Coronary artery disease involving native coronary artery of native heart without angina pectoris    Past Medical History:  Diagnosis Date  . Acquired absence of left foot (HCC)   . Anemia in chronic kidney disease   . Anxiety   . Arthritis   . Asthma   . Atrial fibrillation (HCC)   . CAD (coronary artery disease)   . Cardiac disease   . CHF (congestive heart failure) (HCC)   . CHF (congestive heart failure) (HCC)   . COPD (chronic obstructive pulmonary disease) (HCC)   . Depression   . Diabetes mellitus without complication (HCC)   . Diabetic neuropathy (HCC)   . Emphysema of lung (HCC)   .  Hyperlipidemia   . Hypertension   . Hypothyroidism   . Mild cognitive impairment   . TIA (transient ischemic attack)     Family History  Problem Relation Age of Onset  . Alcohol abuse Father   . Cancer Mother        lung  . Heart disease Mother   . Diabetes Sister   . Heart disease Sister     Past Surgical History:  Procedure Laterality Date  . AMPUTATION Left 10/12/2016   Procedure: TRANSMETATARSAL AMPUTATION LEFT FOOT WITH ACHILLES TENDON LENGTHENING;  Surgeon: Edrick Kins, DPM;  Location: [facility];  Service: Podiatry;  Laterality: Left;  . APPENDECTOMY    . CARDIAC  CATHETERIZATION N/A 02/05/2015   Procedure: Left Heart Cath and Coronary Angiography;  Surgeon: Burnell Blanks, MD;  Location: [facility];  Service: Cardiovascular;  Laterality: N/A;  . EYE SURGERY  2006   unsure if exact procedure  . HIP FRACTURE SURGERY    . INTRAMEDULLARY (IM) NAIL INTERTROCHANTERIC Right 02/06/2015   Procedure: INTRAMEDULLARY (IM) NAIL RIGHT HIP;  Surgeon: Leandrew Koyanagi, MD;  Location: [facility];  Service: Orthopedics;  Laterality: Right;  . JOINT REPLACEMENT     due to hip fracture   Social History   Occupational History  . Not on file  Tobacco Use  . Smoking status: Current Every Day Smoker    Packs/day: 1.00    Years: 63.00    Pack years: 63.00    Types: Cigarettes  . Smokeless tobacco: Never Used  Substance and Sexual Activity  . Alcohol use: Yes    Alcohol/week: 0.0 standard drinks    Comment: 02/09/2017 "used to have a glass of wine at night; stopped cause I take so many pills"  . Drug use: No  . Sexual activity: Never

## 2019-12-01 ENCOUNTER — Encounter: Payer: Self-pay | Admitting: Family Medicine

## 2019-12-01 ENCOUNTER — Ambulatory Visit: Payer: Self-pay

## 2019-12-01 ENCOUNTER — Ambulatory Visit (INDEPENDENT_AMBULATORY_CARE_PROVIDER_SITE_OTHER): Payer: Medicare Other | Admitting: Family Medicine

## 2019-12-01 ENCOUNTER — Other Ambulatory Visit: Payer: Self-pay

## 2019-12-01 DIAGNOSIS — S92011A Displaced fracture of body of right calcaneus, initial encounter for closed fracture: Secondary | ICD-10-CM

## 2019-12-01 NOTE — Progress Notes (Signed)
Office Visit Note   Patient: Tara Flores           Date of Birth: [birth year]           MRN: [id number] Visit Date: 12/01/2019 Requested by: Hilbert Corrigan, [place],  [place] [address] PCP: Hilbert Corrigan, MD  Subjective: Chief Complaint  Patient presents with  . Right Foot - Follow-up    She states that she is not having pain in it now.    HPI: She is about 7 weeks status post right calcaneus fracture with displacement.  She is asymptomatic in her fracture boot.  She is still not bearing weight except for transfers.  She is pleased with her progress.              ROS:   All other systems were reviewed and are negative.  Objective: Vital Signs: There were no vitals taken for this visit.  Physical Exam:  General:  Alert and oriented, in no acute distress. Pulm:  Breathing unlabored. Psy:  Normal mood, congruent affect.  Right heel: She has no significant tenderness to palpation of the calcaneus today.  Imaging: XR Os Calcis Right  Result Date: 12/01/2019 X-rays right heel reveal no change in the alignment of the displaced calcaneus fracture.  She does have some callus formation present today.   Assessment & Plan: 1.  Clinically healing 7 weeks status post displaced right calcaneus fracture -She will start doing ankle rehab under physical therapy supervision.  After couple weeks if she is still feeling well, she can begin weightbearing again. -I will see her back as needed as long as she does not have any flareups.     Procedures: No procedures performed  No notes on file     PMFS History: Patient Active Problem List   Diagnosis Date Noted  . Tobacco use disorder 02/10/2017  . Pulmonary emphysema ([place])   . Acute on chronic diastolic (congestive) heart failure ([facility]) 02/09/2017  . Type 2 diabetes mellitus with hypoglycemia ([facility]) 02/09/2017  . New onset a-fib ([facility]) 11/20/2016  . Atrial fibrillation with RVR ([place])   . COPD  exacerbation ([facility])   . Goals of care, counseling/discussion   . Palliative care by specialist   . TIA (transient ischemic attack) 11/19/2016  . Diabetic foot ulcer ([place]) 10/10/2016  . Hypokalemia 10/10/2016  . Anemia due to chronic kidney disease 10/10/2016  . Cellulitis in diabetic foot ([facility]) 10/09/2016  . Venous ulcer of left leg ([facility]) 07/28/2016  . Dry skin dermatitis 07/28/2016  . Type 2 diabetes mellitus with hyperglycemia ([facility]) 02/06/2015  . Hip fracture ([facility]) 02/05/2015  . Closed right hip fracture ([place] [facility]) 02/05/2015  . Abnormal EKG 02/05/2015  . Tobacco abuse 02/05/2015  . DM2 (diabetes mellitus, type 2) ([place] [facility]) 02/05/2015  . Essential hypertension 02/05/2015  . Hypothyroidism 02/05/2015  . Coronary artery disease involving native coronary artery of native heart without angina pectoris    Past Medical History:  Diagnosis Date  . Acquired absence of left foot ([facility])   . Anemia in chronic kidney disease   . Anxiety   . Arthritis   . Asthma   . Atrial fibrillation ([facility])   . CAD (coronary artery disease)   . Cardiac disease   . CHF (congestive heart failure) ([facility])   . CHF (congestive heart failure) ([facility])   . COPD (chronic obstructive pulmonary disease) ([facility])   . Depression   . Diabetes mellitus without complication ([facility])   .  Diabetic neuropathy (HCC)   . Emphysema of lung (HCC)   . Hyperlipidemia   . Hypertension   . Hypothyroidism   . Mild cognitive impairment   . TIA (transient ischemic attack)     Family History  Problem Relation Age of Onset  . Alcohol abuse Father   . Cancer Mother        lung  . Heart disease Mother   . Diabetes Sister   . Heart disease Sister     Past Surgical History:  Procedure Laterality Date  . AMPUTATION Left 10/12/2016   Procedure: TRANSMETATARSAL AMPUTATION LEFT FOOT WITH ACHILLES TENDON LENGTHENING;  Surgeon: Felecia Shelling, DPM;  Location: [facility];  Service: Podiatry;  Laterality: Left;  . APPENDECTOMY    . CARDIAC CATHETERIZATION  N/A 02/05/2015   Procedure: Left Heart Cath and Coronary Angiography;  Surgeon: Kathleene Hazel, MD;  Location: [facility];  Service: Cardiovascular;  Laterality: N/A;  . EYE SURGERY  2006   unsure if exact procedure  . HIP FRACTURE SURGERY    . INTRAMEDULLARY (IM) NAIL INTERTROCHANTERIC Right 02/06/2015   Procedure: INTRAMEDULLARY (IM) NAIL RIGHT HIP;  Surgeon: Tarry Kos, MD;  Location: [facility];  Service: Orthopedics;  Laterality: Right;  . JOINT REPLACEMENT     due to hip fracture   Social History   Occupational History  . Not on file  Tobacco Use  . Smoking status: Current Every Day Smoker    Packs/day: 1.00    Years: 63.00    Pack years: 63.00    Types: Cigarettes  . Smokeless tobacco: Never Used  Vaping Use  . Vaping Use: Never used  Substance and Sexual Activity  . Alcohol use: Yes    Alcohol/week: 0.0 standard drinks    Comment: 02/09/2017 "used to have a glass of wine at night; stopped cause I take so many pills"  . Drug use: No  . Sexual activity: Never

## 2020-06-25 ENCOUNTER — Encounter (HOSPITAL_COMMUNITY): Payer: Self-pay | Admitting: *Deleted

## 2020-06-25 ENCOUNTER — Other Ambulatory Visit: Payer: Self-pay

## 2020-06-25 ENCOUNTER — Inpatient Hospital Stay (HOSPITAL_COMMUNITY)
Admission: EM | Admit: 2020-06-25 | Discharge: 2020-07-03 | DRG: 628 | Disposition: A | Discharge status: Skilled Nursing Facility | Payer: Medicare Other | Source: Skilled Nursing Facility | Attending: Internal Medicine | Admitting: Internal Medicine

## 2020-06-25 ENCOUNTER — Inpatient Hospital Stay (HOSPITAL_COMMUNITY): Payer: Medicare Other

## 2020-06-25 ENCOUNTER — Emergency Department (HOSPITAL_COMMUNITY): Payer: Medicare Other

## 2020-06-25 DIAGNOSIS — Z6829 Body mass index (BMI) 29.0-29.9, adult: Secondary | ICD-10-CM

## 2020-06-25 DIAGNOSIS — E1122 Type 2 diabetes mellitus with diabetic chronic kidney disease: Secondary | ICD-10-CM | POA: Diagnosis present

## 2020-06-25 DIAGNOSIS — Z8249 Family history of ischemic heart disease and other diseases of the circulatory system: Secondary | ICD-10-CM

## 2020-06-25 DIAGNOSIS — N181 Chronic kidney disease, stage 1: Secondary | ICD-10-CM | POA: Diagnosis present

## 2020-06-25 DIAGNOSIS — M19072 Primary osteoarthritis, left ankle and foot: Secondary | ICD-10-CM | POA: Diagnosis present

## 2020-06-25 DIAGNOSIS — U071 COVID-19: Secondary | ICD-10-CM | POA: Diagnosis not present

## 2020-06-25 DIAGNOSIS — I4891 Unspecified atrial fibrillation: Secondary | ICD-10-CM | POA: Diagnosis not present

## 2020-06-25 DIAGNOSIS — F419 Anxiety disorder, unspecified: Secondary | ICD-10-CM | POA: Diagnosis present

## 2020-06-25 DIAGNOSIS — T8130XA Disruption of wound, unspecified, initial encounter: Secondary | ICD-10-CM | POA: Diagnosis present

## 2020-06-25 DIAGNOSIS — Z811 Family history of alcohol abuse and dependence: Secondary | ICD-10-CM

## 2020-06-25 DIAGNOSIS — E1169 Type 2 diabetes mellitus with other specified complication: Principal | ICD-10-CM | POA: Diagnosis present

## 2020-06-25 DIAGNOSIS — I2510 Atherosclerotic heart disease of native coronary artery without angina pectoris: Secondary | ICD-10-CM | POA: Diagnosis present

## 2020-06-25 DIAGNOSIS — E663 Overweight: Secondary | ICD-10-CM | POA: Diagnosis not present

## 2020-06-25 DIAGNOSIS — I482 Chronic atrial fibrillation, unspecified: Secondary | ICD-10-CM | POA: Diagnosis present

## 2020-06-25 DIAGNOSIS — E1140 Type 2 diabetes mellitus with diabetic neuropathy, unspecified: Secondary | ICD-10-CM | POA: Diagnosis present

## 2020-06-25 DIAGNOSIS — Z7901 Long term (current) use of anticoagulants: Secondary | ICD-10-CM

## 2020-06-25 DIAGNOSIS — M009 Pyogenic arthritis, unspecified: Secondary | ICD-10-CM | POA: Diagnosis present

## 2020-06-25 DIAGNOSIS — E1165 Type 2 diabetes mellitus with hyperglycemia: Secondary | ICD-10-CM | POA: Diagnosis present

## 2020-06-25 DIAGNOSIS — E11621 Type 2 diabetes mellitus with foot ulcer: Secondary | ICD-10-CM | POA: Diagnosis present

## 2020-06-25 DIAGNOSIS — Z20822 Contact with and (suspected) exposure to covid-19: Secondary | ICD-10-CM | POA: Diagnosis present

## 2020-06-25 DIAGNOSIS — D631 Anemia in chronic kidney disease: Secondary | ICD-10-CM | POA: Diagnosis present

## 2020-06-25 DIAGNOSIS — J439 Emphysema, unspecified: Secondary | ICD-10-CM | POA: Diagnosis present

## 2020-06-25 DIAGNOSIS — Z833 Family history of diabetes mellitus: Secondary | ICD-10-CM

## 2020-06-25 DIAGNOSIS — I10 Essential (primary) hypertension: Secondary | ICD-10-CM | POA: Diagnosis present

## 2020-06-25 DIAGNOSIS — M868X7 Other osteomyelitis, ankle and foot: Secondary | ICD-10-CM | POA: Diagnosis not present

## 2020-06-25 DIAGNOSIS — I130 Hypertensive heart and chronic kidney disease with heart failure and stage 1 through stage 4 chronic kidney disease, or unspecified chronic kidney disease: Secondary | ICD-10-CM | POA: Diagnosis present

## 2020-06-25 DIAGNOSIS — E11649 Type 2 diabetes mellitus with hypoglycemia without coma: Secondary | ICD-10-CM | POA: Diagnosis not present

## 2020-06-25 DIAGNOSIS — L97529 Non-pressure chronic ulcer of other part of left foot with unspecified severity: Secondary | ICD-10-CM | POA: Diagnosis present

## 2020-06-25 DIAGNOSIS — F32A Depression, unspecified: Secondary | ICD-10-CM | POA: Diagnosis present

## 2020-06-25 DIAGNOSIS — F17210 Nicotine dependence, cigarettes, uncomplicated: Secondary | ICD-10-CM | POA: Diagnosis present

## 2020-06-25 DIAGNOSIS — Z66 Do not resuscitate: Secondary | ICD-10-CM | POA: Diagnosis present

## 2020-06-25 DIAGNOSIS — I5032 Chronic diastolic (congestive) heart failure: Secondary | ICD-10-CM | POA: Diagnosis present

## 2020-06-25 DIAGNOSIS — M86172 Other acute osteomyelitis, left ankle and foot: Secondary | ICD-10-CM | POA: Diagnosis present

## 2020-06-25 DIAGNOSIS — Z7189 Other specified counseling: Secondary | ICD-10-CM | POA: Diagnosis not present

## 2020-06-25 DIAGNOSIS — G3184 Mild cognitive impairment, so stated: Secondary | ICD-10-CM | POA: Diagnosis present

## 2020-06-25 DIAGNOSIS — Z515 Encounter for palliative care: Secondary | ICD-10-CM | POA: Diagnosis not present

## 2020-06-25 DIAGNOSIS — I739 Peripheral vascular disease, unspecified: Secondary | ICD-10-CM

## 2020-06-25 DIAGNOSIS — E1151 Type 2 diabetes mellitus with diabetic peripheral angiopathy without gangrene: Secondary | ICD-10-CM | POA: Diagnosis present

## 2020-06-25 DIAGNOSIS — E785 Hyperlipidemia, unspecified: Secondary | ICD-10-CM | POA: Diagnosis present

## 2020-06-25 DIAGNOSIS — F17200 Nicotine dependence, unspecified, uncomplicated: Secondary | ICD-10-CM | POA: Diagnosis not present

## 2020-06-25 DIAGNOSIS — J449 Chronic obstructive pulmonary disease, unspecified: Secondary | ICD-10-CM | POA: Diagnosis not present

## 2020-06-25 DIAGNOSIS — Z794 Long term (current) use of insulin: Secondary | ICD-10-CM

## 2020-06-25 DIAGNOSIS — I70202 Unspecified atherosclerosis of native arteries of extremities, left leg: Secondary | ICD-10-CM | POA: Diagnosis present

## 2020-06-25 DIAGNOSIS — M869 Osteomyelitis, unspecified: Secondary | ICD-10-CM | POA: Diagnosis present

## 2020-06-25 DIAGNOSIS — L03116 Cellulitis of left lower limb: Secondary | ICD-10-CM | POA: Diagnosis present

## 2020-06-25 DIAGNOSIS — Z7984 Long term (current) use of oral hypoglycemic drugs: Secondary | ICD-10-CM

## 2020-06-25 DIAGNOSIS — Z801 Family history of malignant neoplasm of trachea, bronchus and lung: Secondary | ICD-10-CM

## 2020-06-25 DIAGNOSIS — Z79899 Other long term (current) drug therapy: Secondary | ICD-10-CM

## 2020-06-25 DIAGNOSIS — E039 Hypothyroidism, unspecified: Secondary | ICD-10-CM | POA: Diagnosis present

## 2020-06-25 DIAGNOSIS — Z89432 Acquired absence of left foot: Secondary | ICD-10-CM | POA: Diagnosis not present

## 2020-06-25 DIAGNOSIS — Z79890 Hormone replacement therapy (postmenopausal): Secondary | ICD-10-CM

## 2020-06-25 DIAGNOSIS — Z8673 Personal history of transient ischemic attack (TIA), and cerebral infarction without residual deficits: Secondary | ICD-10-CM

## 2020-06-25 DIAGNOSIS — Z888 Allergy status to other drugs, medicaments and biological substances status: Secondary | ICD-10-CM

## 2020-06-25 DIAGNOSIS — E119 Type 2 diabetes mellitus without complications: Secondary | ICD-10-CM

## 2020-06-25 HISTORY — DX: Atherosclerotic heart disease of native coronary artery without angina pectoris: I25.10

## 2020-06-25 HISTORY — DX: Peripheral vascular disease, unspecified: I73.9

## 2020-06-25 LAB — CBC WITH DIFFERENTIAL/PLATELET
Abs Immature Granulocytes: 0.03 10*3/uL (ref 0.00–0.07)
Basophils Absolute: 0.1 10*3/uL (ref 0.0–0.1)
Basophils Relative: 1 %
Eosinophils Absolute: 0.2 10*3/uL (ref 0.0–0.5)
Eosinophils Relative: 2 %
HCT: 40.3 % (ref 36.0–46.0)
Hemoglobin: 12.8 g/dL (ref 12.0–15.0)
Immature Granulocytes: 0 %
Lymphocytes Relative: 9 %
Lymphs Abs: 1 10*3/uL (ref 0.7–4.0)
MCH: 31.1 pg (ref 26.0–34.0)
MCHC: 31.8 g/dL (ref 30.0–36.0)
MCV: 97.8 fL (ref 80.0–100.0)
Monocytes Absolute: 0.8 10*3/uL (ref 0.1–1.0)
Monocytes Relative: 8 %
Neutro Abs: 8.4 10*3/uL — ABNORMAL HIGH (ref 1.7–7.7)
Neutrophils Relative %: 80 %
Platelets: 377 10*3/uL (ref 150–400)
RBC: 4.12 MIL/uL (ref 3.87–5.11)
RDW: 13.2 % (ref 11.5–15.5)
WBC: 10.4 10*3/uL (ref 4.0–10.5)
nRBC: 0 % (ref 0.0–0.2)

## 2020-06-25 LAB — BASIC METABOLIC PANEL
Anion gap: 10 (ref 5–15)
BUN: 27 mg/dL — ABNORMAL HIGH (ref 8–23)
CO2: 31 mmol/L (ref 22–32)
Calcium: 9.4 mg/dL (ref 8.9–10.3)
Chloride: 95 mmol/L — ABNORMAL LOW (ref 98–111)
Creatinine, Ser: 0.88 mg/dL (ref 0.44–1.00)
GFR, Estimated: >60 mL/min (ref >60)
Glucose, Bld: 69 mg/dL — ABNORMAL LOW (ref 70–99)
Potassium: 4.5 mmol/L (ref 3.5–5.1)
Sodium: 136 mmol/L (ref 135–145)

## 2020-06-25 LAB — CBG MONITORING, ED
Glucose-Capillary: 52 mg/dL — ABNORMAL LOW (ref 70–99)
Glucose-Capillary: 63 mg/dL — ABNORMAL LOW (ref 70–99)
Glucose-Capillary: 109 mg/dL — ABNORMAL HIGH (ref 70–99)

## 2020-06-25 LAB — GLUCOSE, CAPILLARY: Glucose-Capillary: 91 mg/dL (ref 70–99)

## 2020-06-25 LAB — SEDIMENTATION RATE: Sed Rate: 115 mm/hr — ABNORMAL HIGH (ref 0–22)

## 2020-06-25 LAB — HEMOGLOBIN A1C
Hgb A1c MFr Bld: 9.6 % — ABNORMAL HIGH (ref 4.8–5.6)
Mean Plasma Glucose: 228.82 mg/dL

## 2020-06-25 MED ORDER — VANCOMYCIN HCL 750 MG/150ML IV SOLN
750.0000 mg | INTRAVENOUS | Status: DC
  Administered 2020-06-26 – 2020-06-27 (×2): 750 mg via INTRAVENOUS
  Filled 2020-06-25 (×2): qty 150

## 2020-06-25 MED ORDER — INSULIN ASPART 100 UNIT/ML ~~LOC~~ SOLN
0.0000 [IU] | Freq: Three times a day (TID) | SUBCUTANEOUS | Status: DC
  Administered 2020-06-26 (×2): 2 [IU] via SUBCUTANEOUS
  Administered 2020-06-27: 3 [IU] via SUBCUTANEOUS
  Administered 2020-06-27: 7 [IU] via SUBCUTANEOUS
  Administered 2020-06-27: 5 [IU] via SUBCUTANEOUS
  Administered 2020-06-29: 2 [IU] via SUBCUTANEOUS
  Administered 2020-06-29: 1 [IU] via SUBCUTANEOUS
  Administered 2020-06-30 (×2): 7 [IU] via SUBCUTANEOUS
  Administered 2020-06-30: 3 [IU] via SUBCUTANEOUS
  Administered 2020-07-01: 9 [IU] via SUBCUTANEOUS
  Administered 2020-07-01: 5 [IU] via SUBCUTANEOUS
  Administered 2020-07-02: 9 [IU] via SUBCUTANEOUS
  Administered 2020-07-02: 3 [IU] via SUBCUTANEOUS
  Administered 2020-07-02: 9 [IU] via SUBCUTANEOUS
  Administered 2020-07-03: 2 [IU] via SUBCUTANEOUS

## 2020-06-25 MED ORDER — LEVOTHYROXINE SODIUM 137 MCG PO TABS
137.0000 ug | ORAL_TABLET | Freq: Every day | ORAL | Status: DC
  Administered 2020-06-26 – 2020-07-03 (×7): 137 ug via ORAL
  Filled 2020-06-25 (×8): qty 1

## 2020-06-25 MED ORDER — SODIUM CHLORIDE 0.9% FLUSH
3.0000 mL | Freq: Two times a day (BID) | INTRAVENOUS | Status: DC
  Administered 2020-06-25 – 2020-07-02 (×14): 3 mL via INTRAVENOUS

## 2020-06-25 MED ORDER — SPIRONOLACTONE 25 MG PO TABS
12.5000 mg | ORAL_TABLET | Freq: Two times a day (BID) | ORAL | Status: DC
  Administered 2020-06-25 – 2020-07-03 (×14): 12.5 mg via ORAL
  Filled 2020-06-25: qty 1
  Filled 2020-06-25 (×2): qty 0.5
  Filled 2020-06-25 (×2): qty 1
  Filled 2020-06-25 (×2): qty 0.5
  Filled 2020-06-25: qty 1
  Filled 2020-06-25: qty 0.5
  Filled 2020-06-25: qty 1
  Filled 2020-06-25 (×3): qty 0.5
  Filled 2020-06-25: qty 1
  Filled 2020-06-25 (×2): qty 0.5
  Filled 2020-06-25: qty 1
  Filled 2020-06-25 (×5): qty 0.5
  Filled 2020-06-25: qty 1
  Filled 2020-06-25: qty 0.5
  Filled 2020-06-25 (×5): qty 1
  Filled 2020-06-25 (×3): qty 0.5
  Filled 2020-06-25: qty 1
  Filled 2020-06-25: qty 0.5
  Filled 2020-06-25 (×2): qty 1

## 2020-06-25 MED ORDER — ONDANSETRON HCL 4 MG PO TABS: 4.0000 mg | ORAL_TABLET | Freq: Four times a day (QID) | ORAL | Status: DC | PRN

## 2020-06-25 MED ORDER — NITROGLYCERIN 0.4 MG SL SUBL: 0.4000 mg | SUBLINGUAL_TABLET | SUBLINGUAL | Status: DC | PRN

## 2020-06-25 MED ORDER — ACETAMINOPHEN 325 MG PO TABS
650.0000 mg | ORAL_TABLET | Freq: Four times a day (QID) | ORAL | Status: DC | PRN
  Administered 2020-06-25 – 2020-07-02 (×9): 650 mg via ORAL
  Filled 2020-06-25 (×10): qty 2

## 2020-06-25 MED ORDER — SODIUM CHLORIDE 0.9 % IV SOLN
2.0000 g | INTRAVENOUS | Status: DC
  Administered 2020-06-25 – 2020-07-02 (×6): 2 g via INTRAVENOUS
  Filled 2020-06-25 (×8): qty 20

## 2020-06-25 MED ORDER — CALCIUM CARBONATE-VITAMIN D 500-200 MG-UNIT PO TABS
1.0000 | ORAL_TABLET | Freq: Every day | ORAL | Status: DC
  Administered 2020-06-25 – 2020-07-03 (×8): 1 via ORAL
  Filled 2020-06-25 (×10): qty 1

## 2020-06-25 MED ORDER — NICOTINE 7 MG/24HR TD PT24
7.0000 mg | MEDICATED_PATCH | Freq: Every day | TRANSDERMAL | Status: DC
  Administered 2020-06-25 – 2020-07-03 (×8): 7 mg via TRANSDERMAL
  Filled 2020-06-25 (×10): qty 1

## 2020-06-25 MED ORDER — ALPRAZOLAM 0.25 MG PO TABS
0.2500 mg | ORAL_TABLET | Freq: Two times a day (BID) | ORAL | Status: DC | PRN
  Administered 2020-06-25 – 2020-07-02 (×10): 0.25 mg via ORAL
  Filled 2020-06-25 (×12): qty 1

## 2020-06-25 MED ORDER — ISOSORBIDE MONONITRATE ER 30 MG PO TB24
15.0000 mg | ORAL_TABLET | Freq: Every day | ORAL | Status: DC
  Administered 2020-06-25 – 2020-07-03 (×8): 15 mg via ORAL
  Filled 2020-06-25 (×10): qty 1

## 2020-06-25 MED ORDER — FUROSEMIDE 40 MG PO TABS
60.0000 mg | ORAL_TABLET | Freq: Two times a day (BID) | ORAL | Status: DC
  Administered 2020-06-25 – 2020-07-03 (×13): 60 mg via ORAL
  Filled 2020-06-25 (×9): qty 1
  Filled 2020-06-25: qty 2
  Filled 2020-06-25 (×3): qty 1

## 2020-06-25 MED ORDER — ADULT MULTIVITAMIN W/MINERALS CH
1.0000 | ORAL_TABLET | Freq: Every day | ORAL | Status: DC
  Administered 2020-06-25 – 2020-07-03 (×8): 1 via ORAL
  Filled 2020-06-25 (×8): qty 1

## 2020-06-25 MED ORDER — ENOXAPARIN SODIUM 80 MG/0.8ML ~~LOC~~ SOLN
70.0000 mg | Freq: Two times a day (BID) | SUBCUTANEOUS | Status: DC
  Administered 2020-06-25 – 2020-07-03 (×12): 70 mg via SUBCUTANEOUS
  Filled 2020-06-25 (×14): qty 0.8

## 2020-06-25 MED ORDER — ONDANSETRON HCL 4 MG/2ML IJ SOLN: 4.0000 mg | Freq: Four times a day (QID) | INTRAMUSCULAR | Status: DC | PRN

## 2020-06-25 MED ORDER — VANCOMYCIN HCL 1500 MG/300ML IV SOLN
1500.0000 mg | Freq: Once | INTRAVENOUS | Status: AC
  Administered 2020-06-25: 1500 mg via INTRAVENOUS
  Filled 2020-06-25: qty 300

## 2020-06-25 MED ORDER — BISOPROLOL FUMARATE 5 MG PO TABS
5.0000 mg | ORAL_TABLET | Freq: Every day | ORAL | Status: DC
Start: 2020-06-25 — End: 2020-07-03
  Administered 2020-06-25 – 2020-07-03 (×8): 5 mg via ORAL
  Filled 2020-06-25 (×10): qty 1

## 2020-06-25 MED ORDER — SODIUM CHLORIDE 0.9 % IV SOLN
250.0000 mL | INTRAVENOUS | Status: DC | PRN
  Administered 2020-07-02: 250 mL via INTRAVENOUS

## 2020-06-25 MED ORDER — ACETAMINOPHEN 650 MG RE SUPP: 650.0000 mg | Freq: Four times a day (QID) | RECTAL | Status: DC | PRN

## 2020-06-25 MED ORDER — SODIUM CHLORIDE 0.9% FLUSH
3.0000 mL | INTRAVENOUS | Status: DC | PRN
  Administered 2020-07-02: 3 mL via INTRAVENOUS

## 2020-06-25 MED ORDER — INSULIN ASPART 100 UNIT/ML ~~LOC~~ SOLN
0.0000 [IU] | Freq: Every day | SUBCUTANEOUS | Status: DC
  Administered 2020-06-26: 4 [IU] via SUBCUTANEOUS
  Administered 2020-06-28 – 2020-06-30 (×2): 3 [IU] via SUBCUTANEOUS

## 2020-06-25 MED ORDER — PROSOURCE PLUS PO LIQD
30.0000 mL | Freq: Every day | ORAL | Status: DC
  Administered 2020-06-29 – 2020-07-03 (×5): 30 mL via ORAL
  Filled 2020-06-25 (×7): qty 30

## 2020-06-25 MED ORDER — INSULIN NPH (HUMAN) (ISOPHANE) 100 UNIT/ML ~~LOC~~ SUSP: 20.0000 [IU] | Freq: Two times a day (BID) | SUBCUTANEOUS | Status: DC

## 2020-06-25 MED ORDER — INSULIN NPH (HUMAN) (ISOPHANE) 100 UNIT/ML ~~LOC~~ SUSP
10.0000 [IU] | Freq: Two times a day (BID) | SUBCUTANEOUS | Status: DC
  Administered 2020-06-25 – 2020-06-27 (×3): 10 [IU] via SUBCUTANEOUS
  Filled 2020-06-25 (×2): qty 10

## 2020-06-25 MED ORDER — PRAVASTATIN SODIUM 40 MG PO TABS
20.0000 mg | ORAL_TABLET | Freq: Every day | ORAL | Status: DC
  Administered 2020-06-25 – 2020-07-03 (×8): 20 mg via ORAL
  Filled 2020-06-25: qty 0.5
  Filled 2020-06-25 (×4): qty 1
  Filled 2020-06-25: qty 0.5
  Filled 2020-06-25 (×4): qty 1

## 2020-06-25 MED ORDER — SODIUM CHLORIDE 0.9 % IV SOLN
2.0000 g | Freq: Once | INTRAVENOUS | Status: DC
  Filled 2020-06-25: qty 20

## 2020-06-25 MED ORDER — ZINC SULFATE 220 (50 ZN) MG PO CAPS
220.0000 mg | ORAL_CAPSULE | Freq: Every day | ORAL | Status: DC
  Administered 2020-06-25 – 2020-07-03 (×8): 220 mg via ORAL
  Filled 2020-06-25 (×8): qty 1

## 2020-06-25 MED ORDER — DILTIAZEM HCL ER COATED BEADS 120 MG PO CP24
120.0000 mg | ORAL_CAPSULE | Freq: Every day | ORAL | Status: DC
  Administered 2020-06-25 – 2020-07-03 (×8): 120 mg via ORAL
  Filled 2020-06-25 (×19): qty 1

## 2020-06-25 MED ORDER — LEVALBUTEROL HCL 0.63 MG/3ML IN NEBU
0.6300 mg | INHALATION_SOLUTION | Freq: Four times a day (QID) | RESPIRATORY_TRACT | Status: DC | PRN
  Administered 2020-06-26 – 2020-06-29 (×3): 0.63 mg via RESPIRATORY_TRACT
  Filled 2020-06-25 (×3): qty 3

## 2020-06-25 NOTE — ED Provider Notes (Signed)
[facility] EMERGENCY DEPARTMENT Provider Note  CSN: [id number] Arrival date & time: 06/25/20 1039    History Chief Complaint  Patient presents with  . Foot Problem    HPI  Tara Flores is a 85 y.o. female with history of DM s/p partial amputation of L foot sent from SNF for evaluation of ulcer on L foot. She has had an ulcer there for several days, unclear exactly how long. She was given a course of antibiotics by staff at SNF she is unsure what it was but she thinks it was Clindamycin, without improvement. Started on another antibiotic last night she thinks was doxycycline. She has not had any pain, no fevers. No known injuries.    Past Medical History:  Diagnosis Date  . Acquired absence of left foot (HCC)   . Anemia in chronic kidney disease   . Anxiety   . Arthritis   . Asthma   . Atherosclerotic heart disease   . Atrial fibrillation (HCC)   . CAD (coronary artery disease)   . Cardiac disease   . CHF (congestive heart failure) (HCC)   . CHF (congestive heart failure) (HCC)   . COPD (chronic obstructive pulmonary disease) (HCC)   . Depression   . Diabetes mellitus without complication (HCC)   . Diabetic neuropathy (HCC)   . Emphysema of lung (HCC)   . Hyperlipidemia   . Hypertension   . Hypothyroidism   . Mild cognitive impairment   . Peripheral vascular disease (HCC)   . TIA (transient ischemic attack)     Past Surgical History:  Procedure Laterality Date  . AMPUTATION Left 10/12/2016   Procedure: TRANSMETATARSAL AMPUTATION LEFT FOOT WITH ACHILLES TENDON LENGTHENING;  Surgeon: Felecia Shelling, DPM;  Location: [facility] OR;  Service: Podiatry;  Laterality: Left;  . APPENDECTOMY    . CARDIAC CATHETERIZATION N/A 02/05/2015   Procedure: Left Heart Cath and Coronary Angiography;  Surgeon: Kathleene Hazel, MD;  Location: [facility] INVASIVE CV LAB;  Service: Cardiovascular;  Laterality: N/A;  . EYE SURGERY  2006   unsure if exact procedure  . HIP FRACTURE SURGERY    .  INTRAMEDULLARY (IM) NAIL INTERTROCHANTERIC Right 02/06/2015   Procedure: INTRAMEDULLARY (IM) NAIL RIGHT HIP;  Surgeon: Tarry Kos, MD;  Location: [facility] OR;  Service: Orthopedics;  Laterality: Right;  . JOINT REPLACEMENT     due to hip fracture    Family History  Problem Relation Age of Onset  . Alcohol abuse Father   . Cancer Mother        lung  . Heart disease Mother   . Diabetes Sister   . Heart disease Sister     Social History   Tobacco Use  . Smoking status: Current Every Day Smoker    Packs/day: 0.00    Years: 63.00    Pack years: 0.00    Types: Cigarettes  . Smokeless tobacco: Never Used  . Tobacco comment: 12 cigarettes daily  Vaping Use  . Vaping Use: Never used  Substance Use Topics  . Alcohol use: Not Currently    Alcohol/week: 0.0 standard drinks    Comment: 02/09/2017 "used to have a glass of wine at night; stopped cause I take so many pills"  . Drug use: No     Home Medications Prior to Admission medications   Medication Sig Start Date End Date Taking? Authorizing Provider  acetaminophen (TYLENOL) 325 MG tablet Take 650 mg by mouth every 4 (four) hours as needed for mild pain.  Yes [provider]  ALPRAZolam (XANAX) 0.25 MG tablet Take 1 tablet (0.25 mg total) by mouth 2 (two) times daily as needed for anxiety. 02/16/17  Yes Eugenie Filler, MD  Amino Acids-Protein Hydrolys (FEEDING SUPPLEMENT, PRO-STAT SUGAR FREE 64,) LIQD Take 30 mLs by mouth daily.   Yes [provider]  apixaban (ELIQUIS) 5 MG TABS tablet Take 1 tablet (5 mg total) by mouth 2 (two) times daily. 11/23/16  Yes Verner Mould, MD  Ascorbic Acid (VITAMIN C PO) Take 500 mg by mouth 2 (two) times daily.   Yes [provider]  bisoprolol (ZEBETA) 5 MG tablet Take 1 tablet (5 mg total) by mouth daily. 02/17/17  Yes Eugenie Filler, MD  BYETTA 10 MCG PEN 10 MCG/0.04ML SOPN injection Inject 10 mcg into the skin in the morning and at bedtime. 05/30/20  Yes  [provider]  Calcium Carb-Cholecalciferol 500-400 MG-UNIT TABS Take 1 tablet by mouth daily.   Yes [provider]  COMBIVENT RESPIMAT 20-100 MCG/ACT AERS respimat  07/27/19  Yes [provider]  diltiazem (TIAZAC) 240 MG 24 hr capsule Take 120 mg by mouth daily.   Yes [provider]  doxycycline (VIBRA-TABS) 100 MG tablet Take 100 mg by mouth 2 (two) times daily. For 7 days 06/23/20  Yes [provider]  furosemide (LASIX) 20 MG tablet Take 3 tablets (60 mg total) by mouth 2 (two) times daily. Patient taking differently: Take 80 mg by mouth 2 (two) times daily. 02/16/17  Yes Eugenie Filler, MD  Infant Care Products (BABY SHAMPOO EX) Apply 1 application topically daily. Apply to eyelids every morning for irritation   Yes [provider]  insulin lispro (HUMALOG) 100 UNIT/ML injection Inject 5-10 Units into the skin 3 (three) times daily as needed. Take 5 units every evening and as needed with meals per sliding scale; sliding scale - blood glucose 71-150 give 0 units, 151-200 give 2 units, 201-250 give 4 units. 251-300 give 6 units, 301-350 give 8 units, and for 351-400 give 10 units and call provider   Yes [provider]  insulin NPH Human (HUMULIN N,NOVOLIN N) 100 UNIT/ML injection Inject 20-30 Units into the skin 2 (two) times daily. 40units in the morning and 20 units bedtime   Yes [provider]  isosorbide mononitrate (IMDUR) 30 MG 24 hr tablet Take 0.5 tablets (15 mg total) by mouth daily. 02/17/17  Yes Eugenie Filler, MD  JARDIANCE 10 MG TABS tablet Take 10 mg by mouth daily. 10/05/19  Yes [provider]  Multiple Vitamin (MULTIVITAMIN WITH MINERALS) TABS tablet Take 1 tablet by mouth daily.   Yes [provider]  nitroGLYCERIN (NITROSTAT) 0.4 MG SL tablet Place 1 tablet (0.4 mg total) under the tongue every 5 (five) minutes as needed for chest pain. 07/18/15  Yes Josue Hector, MD  pravastatin  (PRAVACHOL) 20 MG tablet Take 20 mg by mouth daily. 09/27/19  Yes [provider]  PROAIR HFA 108 [phone] Base) MCG/ACT inhaler  10/17/19  Yes [provider]  spironolactone (ALDACTONE) 25 MG tablet Take 0.5 tablets (12.5 mg total) by mouth 2 (two) times daily. Patient taking differently: Take 25 mg by mouth 2 (two) times daily. 02/16/17  Yes Eugenie Filler, MD  SYNTHROID 137 MCG tablet Take 137 mcg by mouth daily. 09/29/19  Yes [provider]  zinc sulfate 220 (50 Zn) MG capsule Take 220 mg by mouth daily.   Yes [provider]  lisinopril (PRINIVIL,ZESTRIL) 2.5 MG tablet Take 1 tablet (2.5 mg total) by mouth daily. Patient not taking: Reported on 06/25/2020 02/17/17   Rodolph Bong, MD     Allergies    Avelox [moxifloxacin], Brethine [terbutaline], and Keflex [cephalexin]   Review of Systems   Review of Systems A comprehensive review of systems was completed and negative except as noted in HPI.    Physical Exam BP (!) 119/57 (BP Location: Left Arm)   Pulse 63   Temp 97.9 F (36.6 C) (Oral)   Resp 18   Ht 5\' 2"  (1.575 m)   Wt 68 kg   SpO2 96%   BMI 27.44 kg/m   Physical Exam Vitals and nursing note reviewed.  Constitutional:      Appearance: Normal appearance.  HENT:     Head: Normocephalic and atraumatic.     Nose: Nose normal.     Mouth/Throat:     Mouth: Mucous membranes are moist.  Eyes:     Extraocular Movements: Extraocular movements intact.     Conjunctiva/sclera: Conjunctivae normal.  Cardiovascular:     Rate and Rhythm: Normal rate.  Pulmonary:     Effort: Pulmonary effort is normal.     Breath sounds: Normal breath sounds.  Abdominal:     General: Abdomen is flat.     Palpations: Abdomen is soft.     Tenderness: There is no abdominal tenderness.  Musculoskeletal:        General: No swelling. Normal range of motion.     Cervical back: Neck supple.     Comments: Large ulcer to L plantar foot, probes to about 1cm depth,  purulent drainage. Chronic appearing venous stasis changes of BLE more proximally  Skin:    General: Skin is warm and dry.  Neurological:     General: No focal deficit present.     Mental Status: She is alert.  Psychiatric:        Mood and Affect: Mood normal.        ED Results / Procedures / Treatments   Labs (all labs ordered are listed, but only abnormal results are displayed) Labs Reviewed  AEROBIC CULTURE (SUPERFICIAL SPECIMEN)  CULTURE, BLOOD (ROUTINE X 2)  CULTURE, BLOOD (ROUTINE X 2)  BASIC METABOLIC PANEL  CBC WITH DIFFERENTIAL/PLATELET  SEDIMENTATION RATE    EKG None  Radiology No results found.  Procedures Procedures  Medications Ordered in the ED Medications - No data to display   MDM Rules/Calculators/A&P MDM  ED Course  I have reviewed the triage vital signs and the nursing notes.  Pertinent labs & imaging results that were available during my care of the patient were reviewed by me and considered in my medical decision making (see chart for details).  Clinical Course as of 06/25/20 1521  Tue Jun 25, 2020  1151 Xray images and results reviewed, concerned for osteomyelitis.  [CS]  1219 WBC is normal [CS]  1347 Sed rate is markedly elevated, again raising concern for osteomyelitis. Will begin antibiotics. Plan admission for further evaluation.  [CS]  1356 Spoke with Dr. Jun 27, 2020, Hospitalist, who will evaluate for admission.  [CS]    Clinical Course User Index [CS] Sherryll Burger, MD    Final Clinical Impression(s) / ED Diagnoses Final diagnoses:  None    Rx / DC Orders ED Discharge Orders    None       Pollyann Savoy, MD 06/25/20 1521

## 2020-06-25 NOTE — H&P (Signed)
History and Physical    Tara Flores DZH:[id number] DOB: Jun 04, 1936 DOA: 06/25/2020  PCP: Bernerd Limbo, MD   Patient coming from: [facility]  Chief Complaint: Worsening left foot wound infection  HPI: Tara Flores is a 85 y.o. female with medical history significant for prior septic arthritis to left foot with transmetatarsal amputation on 09/2016, type 2 diabetes, anxiety, CAD, PVD, dyslipidemia, hypertension, COPD, hypothyroidism, and ongoing tobacco abuse who was brought to the ED due to worsening signs of infection noted to the plantar aspect of her left foot.  There is noted redness and swelling going up her left lower leg and she was having 7-day course of doxycycline initially without any improvement in her symptoms noted.  She is unsure of how long her foot wound has been present, but states that it may have been several weeks.  She denies any fever, chills, pain, or significant discharge.   ED Course: Vital signs stable and patient is afebrile.  X-ray of the left foot demonstrates findings of osteomyelitis to the left cuboid.  Labs are otherwise within normal limits.  Patient has been started on vancomycin and Rocephin for treatment.  Review of Systems: Reviewed as noted above, otherwise negative.  Past Medical History:  Diagnosis Date  . Acquired absence of left foot (HCC)   . Anemia in chronic kidney disease   . Anxiety   . Arthritis   . Asthma   . Atherosclerotic heart disease   . Atrial fibrillation (HCC)   . CAD (coronary artery disease)   . Cardiac disease   . CHF (congestive heart failure) (HCC)   . CHF (congestive heart failure) (HCC)   . COPD (chronic obstructive pulmonary disease) (HCC)   . Depression   . Diabetes mellitus without complication (HCC)   . Diabetic neuropathy (HCC)   . Emphysema of lung (HCC)   . Hyperlipidemia   . Hypertension   . Hypothyroidism   . Mild cognitive impairment   . Peripheral vascular disease (HCC)   . TIA (transient  ischemic attack)     Past Surgical History:  Procedure Laterality Date  . AMPUTATION Left 10/12/2016   Procedure: TRANSMETATARSAL AMPUTATION LEFT FOOT WITH ACHILLES TENDON LENGTHENING;  Surgeon: Felecia Shelling, DPM;  Location: [facility] OR;  Service: Podiatry;  Laterality: Left;  . APPENDECTOMY    . CARDIAC CATHETERIZATION N/A 02/05/2015   Procedure: Left Heart Cath and Coronary Angiography;  Surgeon: Kathleene Hazel, MD;  Location: [facility] INVASIVE CV LAB;  Service: Cardiovascular;  Laterality: N/A;  . EYE SURGERY  2006   unsure if exact procedure  . HIP FRACTURE SURGERY    . INTRAMEDULLARY (IM) NAIL INTERTROCHANTERIC Right 02/06/2015   Procedure: INTRAMEDULLARY (IM) NAIL RIGHT HIP;  Surgeon: Tarry Kos, MD;  Location: [facility] OR;  Service: Orthopedics;  Laterality: Right;  . JOINT REPLACEMENT     due to hip fracture     reports that she has been smoking cigarettes. She has been smoking about 0.00 packs per day for the past 63.00 years. She has never used smokeless tobacco. She reports previous alcohol use. She reports that she does not use drugs.  Allergies  Allergen Reactions  . Avelox [Moxifloxacin]   . Brethine [Terbutaline]     Made patient confused  . Keflex [Cephalexin] Nausea Only    Loss of appetite    Family History  Problem Relation Age of Onset  . Alcohol abuse Father   . Cancer Mother  lung  . Heart disease Mother   . Diabetes Sister   . Heart disease Sister     Prior to Admission medications   Medication Sig Start Date End Date Taking? Authorizing Provider  acetaminophen (TYLENOL) 325 MG tablet Take 650 mg by mouth every 4 (four) hours as needed for mild pain.   Yes [provider]  ALPRAZolam (XANAX) 0.25 MG tablet Take 1 tablet (0.25 mg total) by mouth 2 (two) times daily as needed for anxiety. 02/16/17  Yes Eugenie Filler, MD  Amino Acids-Protein Hydrolys (FEEDING SUPPLEMENT, PRO-STAT SUGAR FREE 64,) LIQD Take 30 mLs by mouth daily.   Yes [provider]  apixaban (ELIQUIS) 5 MG TABS tablet Take 1 tablet (5 mg total) by mouth 2 (two) times daily. 11/23/16  Yes Verner Mould, MD  Ascorbic Acid (VITAMIN C PO) Take 500 mg by mouth 2 (two) times daily.   Yes [provider]  bisoprolol (ZEBETA) 5 MG tablet Take 1 tablet (5 mg total) by mouth daily. 02/17/17  Yes Eugenie Filler, MD  BYETTA 10 MCG PEN 10 MCG/0.04ML SOPN injection Inject 10 mcg into the skin in the morning and at bedtime. 05/30/20  Yes [provider]  Calcium Carb-Cholecalciferol 500-400 MG-UNIT TABS Take 1 tablet by mouth daily.   Yes [provider]  COMBIVENT RESPIMAT 20-100 MCG/ACT AERS respimat  07/27/19  Yes [provider]  diltiazem (TIAZAC) 240 MG 24 hr capsule Take 120 mg by mouth daily.   Yes [provider]  doxycycline (VIBRA-TABS) 100 MG tablet Take 100 mg by mouth 2 (two) times daily. For 7 days 06/23/20  Yes [provider]  furosemide (LASIX) 20 MG tablet Take 3 tablets (60 mg total) by mouth 2 (two) times daily. Patient taking differently: Take 80 mg by mouth 2 (two) times daily. 02/16/17  Yes Eugenie Filler, MD  Infant Care Products (BABY SHAMPOO EX) Apply 1 application topically daily. Apply to eyelids every morning for irritation   Yes [provider]  insulin lispro (HUMALOG) 100 UNIT/ML injection Inject 5-10 Units into the skin 3 (three) times daily as needed. Take 5 units every evening and as needed with meals per sliding scale; sliding scale - blood glucose 71-150 give 0 units, 151-200 give 2 units, 201-250 give 4 units. 251-300 give 6 units, 301-350 give 8 units, and for 351-400 give 10 units and call provider   Yes [provider]  insulin NPH Human (HUMULIN N,NOVOLIN N) 100 UNIT/ML injection Inject 20-30 Units into the skin 2 (two) times daily. 40units in the morning and 20 units bedtime   Yes [provider]  isosorbide mononitrate (IMDUR) 30 MG 24 hr tablet  Take 0.5 tablets (15 mg total) by mouth daily. 02/17/17  Yes Eugenie Filler, MD  JARDIANCE 10 MG TABS tablet Take 10 mg by mouth daily. 10/05/19  Yes [provider]  Multiple Vitamin (MULTIVITAMIN WITH MINERALS) TABS tablet Take 1 tablet by mouth daily.   Yes [provider]  nitroGLYCERIN (NITROSTAT) 0.4 MG SL tablet Place 1 tablet (0.4 mg total) under the tongue every 5 (five) minutes as needed for chest pain. 07/18/15  Yes Josue Hector, MD  pravastatin (PRAVACHOL) 20 MG tablet Take 20 mg by mouth daily. 09/27/19  Yes [provider]  PROAIR HFA 108 [phone] Base) MCG/ACT inhaler  10/17/19  Yes [provider]  spironolactone (ALDACTONE) 25 MG tablet Take 0.5 tablets (12.5 mg total) by mouth 2 (two)  times daily. Patient taking differently: Take 25 mg by mouth 2 (two) times daily. 02/16/17  Yes Rodolph Bong, MD  SYNTHROID 137 MCG tablet Take 137 mcg by mouth daily. 09/29/19  Yes [provider]  zinc sulfate 220 (50 Zn) MG capsule Take 220 mg by mouth daily.   Yes [provider]  lisinopril (PRINIVIL,ZESTRIL) 2.5 MG tablet Take 1 tablet (2.5 mg total) by mouth daily. Patient not taking: Reported on 06/25/2020 02/17/17   Rodolph Bong, MD    Physical Exam: Vitals:   06/25/20 1130 06/25/20 1230 06/25/20 1300 06/25/20 1330  BP: (!) 114/52 (!) 124/50 (!) 116/51 119/63  Pulse: 61 65 62 62  Resp: (!) 25 17 (!) 24 (!) 23  Temp:      TempSrc:      SpO2: 93% 96% 94% 97%  Weight:      Height:        Constitutional: NAD, calm, comfortable Vitals:   06/25/20 1130 06/25/20 1230 06/25/20 1300 06/25/20 1330  BP: (!) 114/52 (!) 124/50 (!) 116/51 119/63  Pulse: 61 65 62 62  Resp: (!) 25 17 (!) 24 (!) 23  Temp:      TempSrc:      SpO2: 93% 96% 94% 97%  Weight:      Height:       Eyes: lids and conjunctivae normal Neck: normal, supple Respiratory: clear to auscultation bilaterally. Normal respiratory effort. No accessory muscle use.   Cardiovascular: Regular rate and rhythm, no murmurs. Abdomen: no tenderness, no distention. Bowel sounds positive.  Musculoskeletal: Left foot with transmetatarsal amputation and pictures noted below    Skin: no rashes, lesions, ulcers.  Psychiatric: Flat affect  Labs on Admission: I have personally reviewed following labs and imaging studies  CBC: Recent Labs  Lab 06/25/20 1201  WBC 10.4  NEUTROABS 8.4*  HGB 12.8  HCT 40.3  MCV 97.8  PLT 377   Basic Metabolic Panel: Recent Labs  Lab 06/25/20 1201  NA 136  K 4.5  CL 95*  CO2 31  GLUCOSE 69*  BUN 27*  CREATININE 0.88  CALCIUM 9.4   GFR: Estimated Creatinine Clearance: 44.6 mL/min (by C-G formula based on SCr of 0.88 mg/dL). Liver Function Tests: No results for input(s): AST, ALT, ALKPHOS, BILITOT, PROT, ALBUMIN in the last 168 hours. No results for input(s): LIPASE, AMYLASE in the last 168 hours. No results for input(s): AMMONIA in the last 168 hours. Coagulation Profile: No results for input(s): INR, PROTIME in the last 168 hours. Cardiac Enzymes: No results for input(s): CKTOTAL, CKMB, CKMBINDEX, TROPONINI in the last 168 hours. BNP (last 3 results) No results for input(s): PROBNP in the last 8760 hours. HbA1C: No results for input(s): HGBA1C in the last 72 hours. CBG: No results for input(s): GLUCAP in the last 168 hours. Lipid Profile: No results for input(s): CHOL, HDL, LDLCALC, TRIG, CHOLHDL, LDLDIRECT in the last 72 hours. Thyroid Function Tests: No results for input(s): TSH, T4TOTAL, FREET4, T3FREE, THYROIDAB in the last 72 hours. Anemia Panel: No results for input(s): VITAMINB12, FOLATE, FERRITIN, TIBC, IRON, RETICCTPCT in the last 72 hours. Urine analysis:    Component Value Date/Time   COLORURINE YELLOW 02/09/2017 2030   APPEARANCEUR CLEAR 02/09/2017 2030   LABSPEC 1.009 02/09/2017 2030   PHURINE 7.0 02/09/2017 2030   GLUCOSEU NEGATIVE 02/09/2017 2030   HGBUR NEGATIVE 02/09/2017 2030    BILIRUBINUR NEGATIVE 02/09/2017 2030   KETONESUR NEGATIVE 02/09/2017 2030   PROTEINUR 30 (A) 02/09/2017 2030  UROBILINOGEN 0.2 02/05/2015 0528   NITRITE NEGATIVE 02/09/2017 2030   LEUKOCYTESUR MODERATE (A) 02/09/2017 2030    Radiological Exams on Admission: DG Foot Complete Left  Result Date: 06/25/2020 CLINICAL DATA:  Diabetic patient with a skin ulceration on the left foot. History of prior amputation. EXAM: LEFT FOOT - COMPLETE 3+ VIEW COMPARISON:  Plain films left ankle 11/18/2016. FINDINGS: Again seen is postoperative change of forefoot amputation. There is a large skin ulceration projecting inferior to the cuboid. There is subtle loss of cortical bone along the inferior margin of the cuboid worrisome for osteomyelitis. Multiple small calcifications are seen at the amputation site. On the frontal view, 2 ossific fragments are seen at the wound. IMPRESSION: Large skin wound on the plantar surface of the foot with findings worrisome for osteomyelitis of the cuboid. Electronically Signed   By: Drusilla Kanner M.D.   On: 06/25/2020 11:42    Assessment/Plan Active Problems:   Osteomyelitis (HCC)    Left foot cellulitis/osteomyelitis -Prior left transmetatarsal amputation 09/2016 -Continue Rocephin and vancomycin and monitor blood cultures -ABI ordered and may require further evaluation with CT angiogram based on results -General surgery consulted for evaluation of amputation -Poor prognosis for salvage including the setting of diabetes and peripheral vascular disease  CAD/PVD/dyslipidemia -Continue home medications  Type 2 diabetes -SSI and carb modified diet  Chronic diastolic congestive heart failure -Currently without overt failure -Maintain on home Lasix -Monitor I's and O's and daily weights  Chronic atrial fibrillation -Hold home Eliquis in case amputation is required and maintained on full dose Lovenox -Continue rate control medications and monitor on  telemetry  Hypertension -Continue home medications and monitor  Hypothyroidism -Continue Synthroid  COPD with ongoing tobacco abuse -Breathing treatments as needed with no acute bronchospasms currently noted -Nicotine patch  DVT prophylaxis: Hold home Eliquis; Start full dose Lovenox Code Status: DNR Family Communication: None at bedside Disposition Plan: Admit for treatment of osteomyelitis and consider amputation Consults called: General surgery Admission status: Inpatient, telemetry  Severity of Illness: The appropriate patient status for this patient is INPATIENT. Inpatient status is judged to be reasonable and necessary in order to provide the required intensity of service to ensure the patient's safety. The patient's presenting symptoms, physical exam findings, and initial radiographic and laboratory data in the context of their chronic comorbidities is felt to place them at high risk for further clinical deterioration. Furthermore, it is not anticipated that the patient will be medically stable for discharge from the hospital within 2 midnights of admission. The following factors support the patient status of inpatient.   " The patient's presenting symptoms include L foot wound. " The worrisome physical exam findings include worsening L foot wound " The initial radiographic and laboratory data are worrisome because of L foot osteomyelitis " The chronic co-morbidities include DM, PVD   * I certify that at the point of admission it is my clinical judgment that the patient will require inpatient hospital care spanning beyond 2 midnights from the point of admission due to high intensity of service, high risk for further deterioration and high frequency of surveillance required.*    Krystl Wickware D Greidys Deland DO [facility]  If 7PM-7AM, please contact night-coverage [email or web address]  06/25/2020, 2:31 PM

## 2020-06-25 NOTE — Progress Notes (Signed)
Pharmacy Antibiotic Note  Tara Flores is a 85 y.o. female admitted on 06/25/2020 with osteomyelitis.  Pharmacy has been consulted for Vancomycin dosing.  Plan: Vancomycin 1500 mg IV x 1 dose. Vancomycin 750 mg IV every 24 hours. Expected AUC 470. Monitor labs, c/s, and vanco level as indicated.  Height: 5\' 2"  (157.5 cm) Weight: 68 kg (150 lb) IBW/kg (Calculated) : 50.1  Temp (24hrs), Avg:97.9 F (36.6 C), Min:97.9 F (36.6 C), Max:97.9 F (36.6 C)  Recent Labs  Lab 06/25/20 1201  WBC 10.4  CREATININE 0.88    Estimated Creatinine Clearance: 44.6 mL/min (by C-G formula based on SCr of 0.88 mg/dL).    Allergies  Allergen Reactions  . Avelox [Moxifloxacin]   . Brethine [Terbutaline]     Made patient confused  . Keflex [Cephalexin] Nausea Only    Loss of appetite    Antimicrobials this admission: Vanco 1/4 >>  CTX 1/4 >>    Microbiology results: 1/4 BCx: pending 1/4 Wound Cx: pending  Thank you for allowing pharmacy to be a part of this patient's care.  3/4, PharmD Clinical Pharmacist 06/25/2020 3:03 PM

## 2020-06-25 NOTE — ED Triage Notes (Signed)
Pt brought in by [facility] from [facility] with c/o left foot wound that they were concerned was infected. Pt has wound to plantar aspect of left foot. Redness and swelling going up the left lower leg. Pt reports she has taken a 7 day course of Doxycycline for the foot wound. Pt unsure how long the wound has been there but she approximates a few weeks. Denies fever.

## 2020-06-25 NOTE — Progress Notes (Signed)
ANTICOAGULATION CONSULT NOTE - Initial Consult  Pharmacy Consult for lovenox Indication: atrial fibrillation  Allergies  Allergen Reactions  . Avelox [Moxifloxacin]   . Brethine [Terbutaline]     Made patient confused  . Keflex [Cephalexin] Nausea Only    Loss of appetite    Patient Measurements: Height: 5\' 2"  (157.5 cm) Weight: 68 kg (150 lb) IBW/kg (Calculated) : 50.1 Heparin Dosing Weight:   Vital Signs: Temp: 97.9 F (36.6 C) (01/04 1100) Temp Source: Oral (01/04 1100) BP: 119/63 (01/04 1330) Pulse Rate: 62 (01/04 1330)  Labs: Recent Labs    06/25/20 1201  HGB 12.8  HCT 40.3  PLT 377  CREATININE 0.88    Estimated Creatinine Clearance: 44.6 mL/min (by C-G formula based on SCr of 0.88 mg/dL).   Medical History: Past Medical History:  Diagnosis Date  . Acquired absence of left foot (HCC)   . Anemia in chronic kidney disease   . Anxiety   . Arthritis   . Asthma   . Atherosclerotic heart disease   . Atrial fibrillation (HCC)   . CAD (coronary artery disease)   . Cardiac disease   . CHF (congestive heart failure) (HCC)   . CHF (congestive heart failure) (HCC)   . COPD (chronic obstructive pulmonary disease) (HCC)   . Depression   . Diabetes mellitus without complication (HCC)   . Diabetic neuropathy (HCC)   . Emphysema of lung (HCC)   . Hyperlipidemia   . Hypertension   . Hypothyroidism   . Mild cognitive impairment   . Peripheral vascular disease (HCC)   . TIA (transient ischemic attack)     Medications:  (Not in a hospital admission)   Assessment: Pharmacy consulted to dose lovenox in patient with atrial fibrillation.  Patient on Eliquis prior to admission with last dose listed as 1/3 1600.    CBC WNL  Goal of Therapy:   Monitor platelets by anticoagulation protocol: Yes   Plan:  Lovenox 70 mg subq every 12 hours. Monitor labs and s/s of bleeding.  08/23/20 Dorina Ribaudo 06/25/2020,2:54 PM

## 2020-06-26 ENCOUNTER — Encounter (HOSPITAL_COMMUNITY): Payer: Self-pay | Admitting: Internal Medicine

## 2020-06-26 DIAGNOSIS — M869 Osteomyelitis, unspecified: Secondary | ICD-10-CM | POA: Diagnosis not present

## 2020-06-26 DIAGNOSIS — I739 Peripheral vascular disease, unspecified: Secondary | ICD-10-CM

## 2020-06-26 DIAGNOSIS — I482 Chronic atrial fibrillation, unspecified: Secondary | ICD-10-CM | POA: Diagnosis not present

## 2020-06-26 DIAGNOSIS — I5032 Chronic diastolic (congestive) heart failure: Secondary | ICD-10-CM | POA: Diagnosis not present

## 2020-06-26 DIAGNOSIS — Z7189 Other specified counseling: Secondary | ICD-10-CM | POA: Diagnosis not present

## 2020-06-26 DIAGNOSIS — I10 Essential (primary) hypertension: Secondary | ICD-10-CM

## 2020-06-26 DIAGNOSIS — Z515 Encounter for palliative care: Secondary | ICD-10-CM

## 2020-06-26 DIAGNOSIS — J449 Chronic obstructive pulmonary disease, unspecified: Secondary | ICD-10-CM | POA: Diagnosis present

## 2020-06-26 DIAGNOSIS — E039 Hypothyroidism, unspecified: Secondary | ICD-10-CM

## 2020-06-26 DIAGNOSIS — E663 Overweight: Secondary | ICD-10-CM | POA: Diagnosis present

## 2020-06-26 DIAGNOSIS — M86172 Other acute osteomyelitis, left ankle and foot: Secondary | ICD-10-CM | POA: Diagnosis not present

## 2020-06-26 DIAGNOSIS — L97529 Non-pressure chronic ulcer of other part of left foot with unspecified severity: Secondary | ICD-10-CM

## 2020-06-26 LAB — CBC
HCT: 36.5 % (ref 36.0–46.0)
Hemoglobin: 11.5 g/dL — ABNORMAL LOW (ref 12.0–15.0)
MCH: 30.7 pg (ref 26.0–34.0)
MCHC: 31.5 g/dL (ref 30.0–36.0)
MCV: 97.6 fL (ref 80.0–100.0)
Platelets: 350 10*3/uL (ref 150–400)
RBC: 3.74 MIL/uL — ABNORMAL LOW (ref 3.87–5.11)
RDW: 13.3 % (ref 11.5–15.5)
WBC: 9.2 10*3/uL (ref 4.0–10.5)
nRBC: 0 % (ref 0.0–0.2)

## 2020-06-26 LAB — COMPREHENSIVE METABOLIC PANEL
ALT: 14 U/L (ref 0–44)
AST: 13 U/L — ABNORMAL LOW (ref 15–41)
Albumin: 2.5 g/dL — ABNORMAL LOW (ref 3.5–5.0)
Alkaline Phosphatase: 90 U/L (ref 38–126)
Anion gap: 9 (ref 5–15)
BUN: 25 mg/dL — ABNORMAL HIGH (ref 8–23)
CO2: 27 mmol/L (ref 22–32)
Calcium: 8.8 mg/dL — ABNORMAL LOW (ref 8.9–10.3)
Chloride: 98 mmol/L (ref 98–111)
Creatinine, Ser: 0.74 mg/dL (ref 0.44–1.00)
GFR, Estimated: >60 mL/min (ref >60)
Glucose, Bld: 114 mg/dL — ABNORMAL HIGH (ref 70–99)
Potassium: 3.6 mmol/L (ref 3.5–5.1)
Sodium: 134 mmol/L — ABNORMAL LOW (ref 135–145)
Total Bilirubin: 0.5 mg/dL (ref 0.3–1.2)
Total Protein: 7.3 g/dL (ref 6.5–8.1)

## 2020-06-26 LAB — GLUCOSE, CAPILLARY
Glucose-Capillary: 84 mg/dL (ref 70–99)
Glucose-Capillary: 195 mg/dL — ABNORMAL HIGH (ref 70–99)
Glucose-Capillary: 177 mg/dL — ABNORMAL HIGH (ref 70–99)
Glucose-Capillary: 355 mg/dL — ABNORMAL HIGH (ref 70–99)

## 2020-06-26 LAB — MRSA PCR SCREENING: MRSA by PCR: POSITIVE — AB

## 2020-06-26 LAB — MAGNESIUM: Magnesium: 2.2 mg/dL (ref 1.7–2.4)

## 2020-06-26 LAB — SARS CORONAVIRUS 2 (TAT 6-24 HRS): SARS Coronavirus 2: NEGATIVE

## 2020-06-26 MED ORDER — MUPIROCIN 2 % EX OINT
1.0000 "application " | TOPICAL_OINTMENT | Freq: Two times a day (BID) | CUTANEOUS | Status: AC
  Administered 2020-06-26 – 2020-06-30 (×10): 1 via NASAL
  Filled 2020-06-26 (×2): qty 22

## 2020-06-26 MED ORDER — CHLORHEXIDINE GLUCONATE CLOTH 2 % EX PADS
6.0000 | MEDICATED_PAD | Freq: Every day | CUTANEOUS | Status: AC
  Administered 2020-06-26 – 2020-06-30 (×5): 6 via TOPICAL

## 2020-06-26 NOTE — Consult Note (Signed)
[facility] Consult  Reason for Consult: Left foot plantar ulcer Referring Physician:  Dr. Rito Ehrlich, MD   Chief Complaint    Foot Problem      HPI: VEDHA TERCERO is a 85 y.o. female with A fib, COPD, PVD, DM, HTN, CHF, prior transmetatarsal amputation 2018 for osteomyelitis with podiatry in [place].  She live at [facility] and says she has not ambulated in some time but uses her foot stump to transition.  She is otherwise in a wheelchair or in bed. She comes in redness and drainage of a left foot ulcer that failed outpatient management with doxycycline.  She is unsure how long the wound has been present. She reports some sensation in the foot. She has concerns for osteomyelitis on Xray and ABI are abnormal.  Discussed with Dr. Neoma Laming this AM and recommended Vascular for angiography to determine blood flow and options for revascularization.  Palliative has also seen the patient today and the patient reports to me she wants to go home and die.  She is leery of any amputation.   Past Medical History:  Diagnosis Date  . Acquired absence of left foot (HCC)   . Anemia in chronic kidney disease   . Anxiety   . Arthritis   . Asthma   . Atherosclerotic heart disease   . Atrial fibrillation (HCC)   . CAD (coronary artery disease)   . Cardiac disease   . CHF (congestive heart failure) (HCC)   . CHF (congestive heart failure) (HCC)   . COPD (chronic obstructive pulmonary disease) (HCC)   . Depression   . Diabetes mellitus without complication (HCC)   . Diabetic neuropathy (HCC)   . Emphysema of lung (HCC)   . Hyperlipidemia   . Hypertension   . Hypothyroidism   . Mild cognitive impairment   . Peripheral vascular disease (HCC)   . TIA (transient ischemic attack)     Past Surgical History:  Procedure Laterality Date  . AMPUTATION Left 10/12/2016   Procedure: TRANSMETATARSAL AMPUTATION LEFT FOOT WITH ACHILLES TENDON LENGTHENING;  Surgeon: Felecia Shelling, DPM;   Location: [facility] OR;  Service: Podiatry;  Laterality: Left;  . APPENDECTOMY    . CARDIAC CATHETERIZATION N/A 02/05/2015   Procedure: Left Heart Cath and Coronary Angiography;  Surgeon: Kathleene Hazel, MD;  Location: [facility];  Service: Cardiovascular;  Laterality: N/A;  . EYE SURGERY  2006   unsure if exact procedure  . HIP FRACTURE SURGERY    . INTRAMEDULLARY (IM) NAIL INTERTROCHANTERIC Right 02/06/2015   Procedure: INTRAMEDULLARY (IM) NAIL RIGHT HIP;  Surgeon: Tarry Kos, MD;  Location: [facility] OR;  Service: Orthopedics;  Laterality: Right;  . JOINT REPLACEMENT     due to hip fracture    Family History  Problem Relation Age of Onset  . Alcohol abuse Father   . Cancer Mother        lung  . Heart disease Mother   . Diabetes Sister   . Heart disease Sister     Social History   Tobacco Use  . Smoking status: Current Every Day Smoker    Packs/day: 0.00    Years: 63.00    Pack years: 0.00    Types: Cigarettes  . Smokeless tobacco: Never Used  . Tobacco comment: 12 cigarettes daily  Vaping Use  . Vaping Use: Never used  Substance Use Topics  . Alcohol use: Not Currently    Alcohol/week: 0.0 standard drinks    Comment: 02/09/2017 "  used to have a glass of wine at night; stopped cause I take so many pills"  . Drug use: No    Medications: I have reviewed the patient's current medications. Current Facility-Administered Medications  Medication Dose Route Frequency Provider Last Rate Last Admin  . (feeding supplement) PROSource Plus liquid 30 mL  30 mL Oral Daily Shah, Pratik D, DO      . 0.9 %  sodium chloride infusion  250 mL Intravenous PRN Sherryll Burger, Pratik D, DO      . acetaminophen (TYLENOL) tablet 650 mg  650 mg Oral Q6H PRN Maurilio Lovely D, DO   650 mg at 06/26/20 8563   Or  . acetaminophen (TYLENOL) suppository 650 mg  650 mg Rectal Q6H PRN Sherryll Burger, Pratik D, DO      . ALPRAZolam Prudy Feeler) tablet 0.25 mg  0.25 mg Oral BID PRN Sherryll Burger, Pratik D, DO   0.25 mg at 06/26/20 1035   . bisoprolol (ZEBETA) tablet 5 mg  5 mg Oral Daily Sherryll Burger, Pratik D, DO   5 mg at 06/26/20 1029  . calcium-vitamin D (OSCAL WITH D) 500-200 MG-UNIT per tablet 1 tablet  1 tablet Oral Daily Sherryll Burger, Pratik D, DO   1 tablet at 06/26/20 1030  . cefTRIAXone (ROCEPHIN) 2 g in sodium chloride 0.9 % 100 mL IVPB  2 g Intravenous Q24H Sherryll Burger, Pratik D, DO 200 mL/hr at 06/26/20 1604 2 g at 06/26/20 1604  . Chlorhexidine Gluconate Cloth 2 % PADS 6 each  6 each Topical Q0600 Maurilio Lovely D, DO   6 each at 06/26/20 1030  . diltiazem (TIAZAC) 24 hr capsule 120 mg  120 mg Oral Daily Sherryll Burger, Pratik D, DO   120 mg at 06/26/20 1030  . enoxaparin (LOVENOX) injection 70 mg  70 mg Subcutaneous Q12H Shah, Pratik D, DO   70 mg at 06/26/20 1612  . furosemide (LASIX) tablet 60 mg  60 mg Oral BID Maurilio Lovely D, DO   60 mg at 06/26/20 1605  . insulin aspart (novoLOG) injection 0-5 Units  0-5 Units Subcutaneous QHS Sherryll Burger, Pratik D, DO      . insulin aspart (novoLOG) injection 0-9 Units  0-9 Units Subcutaneous TID WC Sherryll Burger, Pratik D, DO   2 Units at 06/26/20 1227  . insulin NPH Human (NOVOLIN N) injection 10 Units  10 Units Subcutaneous BID AC & HS Maurilio Lovely D, DO   10 Units at 06/25/20 2218  . isosorbide mononitrate (IMDUR) 24 hr tablet 15 mg  15 mg Oral Daily Sherryll Burger, Pratik D, DO   15 mg at 06/26/20 1030  . levalbuterol (XOPENEX) nebulizer solution 0.63 mg  0.63 mg Inhalation Q6H PRN Sherryll Burger, Pratik D, DO      . levothyroxine (SYNTHROID) tablet 137 mcg  137 mcg Oral Daily Maurilio Lovely D, DO   137 mcg at 06/26/20 1497  . multivitamin with minerals tablet 1 tablet  1 tablet Oral Daily Sherryll Burger, Pratik D, DO   1 tablet at 06/26/20 1029  . mupirocin ointment (BACTROBAN) 2 % 1 application  1 application Nasal BID Maurilio Lovely D, DO   1 application at 06/26/20 1030  . nicotine (NICODERM CQ - dosed in mg/24 hr) patch 7 mg  7 mg Transdermal Daily Sherryll Burger, Pratik D, DO   7 mg at 06/26/20 1028  . nitroGLYCERIN (NITROSTAT) SL tablet 0.4 mg  0.4 mg  Sublingual Q5 min PRN Sherryll Burger, Pratik D, DO      . ondansetron (ZOFRAN) tablet 4 mg  4  mg Oral Q6H PRN Sherryll Burger, Pratik D, DO       Or  . ondansetron (ZOFRAN) injection 4 mg  4 mg Intravenous Q6H PRN Sherryll Burger, Pratik D, DO      . pravastatin (PRAVACHOL) tablet 20 mg  20 mg Oral Daily Sherryll Burger, Pratik D, DO   20 mg at 06/26/20 1029  . sodium chloride flush (NS) 0.9 % injection 3 mL  3 mL Intravenous Q12H Shah, Pratik D, DO   3 mL at 06/26/20 1036  . sodium chloride flush (NS) 0.9 % injection 3 mL  3 mL Intravenous PRN Sherryll Burger, Pratik D, DO      . spironolactone (ALDACTONE) tablet 12.5 mg  12.5 mg Oral BID Sherryll Burger, Pratik D, DO   12.5 mg at 06/26/20 1035  . vancomycin (VANCOREADY) IVPB 750 mg/150 mL  750 mg Intravenous Q24H Maurilio Lovely D, DO 150 mL/hr at 06/26/20 1755 750 mg at 06/26/20 1755  . zinc sulfate capsule 220 mg  220 mg Oral Daily Sherryll Burger, Pratik D, DO   220 mg at 06/26/20 1029   Allergies  Allergen Reactions  . Avelox [Moxifloxacin]   . Brethine [Terbutaline]     Made patient confused  . Keflex [Cephalexin] Nausea Only    Loss of appetite     ROS:  A comprehensive review of systems was negative except for: Musculoskeletal: positive for left plantar surface ulcer with drainage Behavioral/Psych: positive for reports she is considering not doing any aggressive management and wants to go home to die, says "I'm 84"  Blood pressure (!) 125/58, pulse 64, temperature 98.2 F (36.8 C), temperature source Oral, resp. rate 18, height 5\' 2"  (1.575 m), weight 68 kg, SpO2 95 %. Physical Exam Vitals reviewed.  HENT:     Head: Normocephalic.     Nose: Nose normal.  Eyes:     Comments: Dry discharge on bilateral eyelashes  Cardiovascular:     Rate and Rhythm: Normal rate.     Pulses:          Dorsalis pedis pulses are 0 on the right side and 0 on the left side.       Posterior tibial pulses are 0 on the right side and 0 on the left side.  Pulmonary:     Effort: Pulmonary effort is normal.  Abdominal:      General: There is no distension.     Palpations: Abdomen is soft.  Musculoskeletal:        General: No swelling.     Comments: Mild erythema to bilateral lower extremity, left plantar surface with large ulcerated area with drainage, unstageable ulcer, necrotic and purulent drainage  Skin:    General: Skin is warm.  Neurological:     General: No focal deficit present.     Mental Status: She is alert and oriented to person, place, and time.     Results: Results for orders placed or performed during the hospital encounter of 06/25/20 (from the past 48 hour(s))  Wound or Superficial Culture     Status: None (Preliminary result)   Collection Time: 06/25/20 11:35 AM   Specimen: Foot; Wound  Result Value Ref Range   Specimen Description      FOOT LEFT Performed at [facility], [address] [place], [place]    Special Requests      NONE Performed at [facility], [address], [place], [place]    Gram Stain      NO WBC SEEN ABUNDANT GRAM  POSITIVE COCCI FEW GRAM NEGATIVE RODS    Culture      CULTURE REINCUBATED FOR BETTER GROWTH Performed at [facility], [address] [place], [place] [address]    Report Status PENDING   Basic metabolic panel     Status: Abnormal   Collection Time: 06/25/20 12:01 PM  Result Value Ref Range   Sodium 136 135 - 145 mmol/L   Potassium 4.5 3.5 - 5.1 mmol/L   Chloride 95 (L) 98 - 111 mmol/L   CO2 31 22 - 32 mmol/L   Glucose, Bld 69 (L) 70 - 99 mg/dL    Comment: Glucose reference range applies only to samples taken after fasting for at least 8 hours.   BUN 27 (H) 8 - 23 mg/dL   Creatinine, Ser 1.91 0.44 - 1.00 mg/dL   Calcium 9.4 8.9 - 47.8 mg/dL   GFR, Estimated >29 >56 mL/min    Comment: (NOTE) Calculated using the CKD-EPI Creatinine Equation (2021)    Anion gap 10 5 - 15    Comment: Performed at [facility], [address] [place], [place] [address]  CBC with Differential     Status: Abnormal   Collection  Time: 06/25/20 12:01 PM  Result Value Ref Range   WBC 10.4 4.0 - 10.5 K/uL   RBC 4.12 3.87 - 5.11 MIL/uL   Hemoglobin 12.8 12.0 - 15.0 g/dL   HCT 65.7 84.6 - 96.2 %   MCV 97.8 80.0 - 100.0 fL   MCH 31.1 26.0 - 34.0 pg   MCHC 31.8 30.0 - 36.0 g/dL   RDW 95.2 84.1 - 32.4 %   Platelets 377 150 - 400 K/uL   nRBC 0.0 0.0 - 0.2 %   Neutrophils Relative % 80 %   Neutro Abs 8.4 (H) 1.7 - 7.7 K/uL   Lymphocytes Relative 9 %   Lymphs Abs 1.0 0.7 - 4.0 K/uL   Monocytes Relative 8 %   Monocytes Absolute 0.8 0.1 - 1.0 K/uL   Eosinophils Relative 2 %   Eosinophils Absolute 0.2 0.0 - 0.5 K/uL   Basophils Relative 1 %   Basophils Absolute 0.1 0.0 - 0.1 K/uL   Immature Granulocytes 0 %   Abs Immature Granulocytes 0.03 0.00 - 0.07 K/uL    Comment: Performed at [facility], [address], [place], [place] [address]  Sedimentation rate     Status: Abnormal   Collection Time: 06/25/20 12:01 PM  Result Value Ref Range   Sed Rate 115 (H) 0 - 22 mm/hr    Comment: Performed at [facility], [address] [place], [place] [address]  Culture, blood (routine x 2)     Status: None (Preliminary result)   Collection Time: 06/25/20 12:01 PM   Specimen: BLOOD  Result Value Ref Range   Specimen Description BLOOD BLOOD LEFT HAND    Special Requests      BOTTLES DRAWN AEROBIC AND ANAEROBIC Blood Culture results may not be optimal due to an inadequate volume of blood received in culture bottles   Culture      NO GROWTH 1 DAY Performed at [facility], [address], [place], [place] [address]    Report Status PENDING   Culture, blood (routine x 2)     Status: None (Preliminary result)   Collection Time: 06/25/20 12:01 PM   Specimen: BLOOD  Result Value Ref Range   Specimen Description BLOOD LEFT ANTECUBITAL    Special Requests      BOTTLES DRAWN  AEROBIC AND ANAEROBIC Blood Culture adequate volume   Culture      NO GROWTH 1 DAY Performed at [facility], [address] [place], [place] [address]     Report Status PENDING   SARS CORONAVIRUS 2 (TAT 6-24 HRS) Nasopharyngeal Nasopharyngeal Swab     Status: None   Collection Time: 06/25/20  2:00 PM   Specimen: Nasopharyngeal Swab  Result Value Ref Range   SARS Coronavirus 2 NEGATIVE NEGATIVE    Comment: (NOTE) SARS-CoV-2 target nucleic acids are NOT DETECTED.  The SARS-CoV-2 RNA is generally detectable in upper and lower respiratory specimens during the acute phase of infection. Negative results do not preclude SARS-CoV-2 infection, do not rule out co-infections with other pathogens, and should not be used as the sole basis for treatment or other patient management decisions. Negative results must be combined with clinical observations, patient history, and epidemiological information. The expected result is Negative.  Fact Sheet for Patients: SugarRoll.be  Fact Sheet for Healthcare Providers: https://www.woods-mathews.com/  This test is not yet approved or cleared by the [place] FDA and  has been authorized for detection and/or diagnosis of SARS-CoV-2 by FDA under an Emergency Use Authorization (EUA). This EUA will remain  in effect (meaning this test can be used) for the duration of the COVID-19 declaration under Se ction 564(b)(1) of the Act, 21 U.S.C. section 360bbb-3(b)(1), unless the authorization is terminated or revoked sooner.  Performed at [facility], [place] [address], [place], [place] [address]   Hemoglobin A1c     Status: Abnormal   Collection Time: 06/25/20  3:50 PM  Result Value Ref Range   Hgb A1c MFr Bld 9.6 (H) 4.8 - 5.6 %    Comment: (NOTE) Pre diabetes:          5.7%-6.4%  Diabetes:              >6.4%  Glycemic control for   <7.0% adults with diabetes    Mean Plasma Glucose 228.82 mg/dL    Comment: Performed at [facility] [address], [place], [place] [address]  CBG monitoring, ED     Status: Abnormal   Collection Time: 06/25/20  5:10  PM  Result Value Ref Range   Glucose-Capillary 52 (L) 70 - 99 mg/dL    Comment: Glucose reference range applies only to samples taken after fasting for at least 8 hours.  CBG monitoring, ED     Status: Abnormal   Collection Time: 06/25/20  5:29 PM  Result Value Ref Range   Glucose-Capillary 63 (L) 70 - 99 mg/dL    Comment: Glucose reference range applies only to samples taken after fasting for at least 8 hours.  CBG monitoring, ED     Status: Abnormal   Collection Time: 06/25/20  6:28 PM  Result Value Ref Range   Glucose-Capillary 109 (H) 70 - 99 mg/dL    Comment: Glucose reference range applies only to samples taken after fasting for at least 8 hours.  Glucose, capillary     Status: None   Collection Time: 06/25/20  8:22 PM  Result Value Ref Range   Glucose-Capillary 91 70 - 99 mg/dL    Comment: Glucose reference range applies only to samples taken after fasting for at least 8 hours.  MRSA PCR Screening     Status: Abnormal   Collection Time: 06/26/20  1:32 AM   Specimen: Nasal Mucosa; Nasopharyngeal  Result Value Ref Range   MRSA by PCR POSITIVE (A)  NEGATIVE    Comment:        The GeneXpert MRSA Assay (FDA approved for NASAL specimens only), is one component of a comprehensive MRSA colonization surveillance program. It is not intended to diagnose MRSA infection nor to guide or monitor treatment for MRSA infections. RESULT CALLED TO, READ BACK BY AND VERIFIED WITH: PEACH,RN@[id number]  06/26/20 MKELLY Performed at [facility], [address] [place], [place] [address]   Magnesium     Status: None   Collection Time: 06/26/20  4:17 AM  Result Value Ref Range   Magnesium 2.2 1.7 - 2.4 mg/dL    Comment: Performed at [facility], [address] [place] [address] [place], [place] [address]  Comprehensive metabolic panel     Status: Abnormal   Collection Time: 06/26/20  4:17 AM  Result Value Ref Range   Sodium 134 (L) 135 - 145 mmol/L   Potassium 3.6 3.5 - 5.1 mmol/L    Comment: DELTA CHECK NOTED    Chloride 98 98 - 111 mmol/L   CO2 27 22 - 32 mmol/L   Glucose, Bld 114 (H) 70 - 99 mg/dL    Comment: Glucose reference range applies only to samples taken after fasting for at least 8 hours.   BUN 25 (H) 8 - 23 mg/dL   Creatinine, Ser 8.650.74 0.44 - 1.00 mg/dL   Calcium 8.8 (L) 8.9 - 10.3 mg/dL   Total Protein 7.3 6.5 - 8.1 g/dL   Albumin 2.5 (L) 3.5 - 5.0 g/dL   AST 13 (L) 15 - 41 U/L   ALT 14 0 - 44 U/L   Alkaline Phosphatase 90 38 - 126 U/L   Total Bilirubin 0.5 0.3 - 1.2 mg/dL   GFR, Estimated >78>60 >46>60 mL/min    Comment: (NOTE) Calculated using the CKD-EPI Creatinine Equation (2021)    Anion gap 9 5 - 15    Comment: Performed at [facility], [address] [place], [place] [address]  CBC     Status: Abnormal   Collection Time: 06/26/20  4:17 AM  Result Value Ref Range   WBC 9.2 4.0 - 10.5 K/uL   RBC 3.74 (L) 3.87 - 5.11 MIL/uL   Hemoglobin 11.5 (L) 12.0 - 15.0 g/dL   HCT 28.436.5 13.236.0 - 44.046.0 %   MCV 97.6 80.0 - 100.0 fL   MCH 30.7 26.0 - 34.0 pg   MCHC 31.5 30.0 - 36.0 g/dL   RDW 10.213.3 72.511.5 - 36.615.5 %   Platelets 350 150 - 400 K/uL   nRBC 0.0 0.0 - 0.2 %    Comment: Performed at [facility], [address] [place], [place] [address]  Glucose, capillary     Status: None   Collection Time: 06/26/20  8:06 AM  Result Value Ref Range   Glucose-Capillary 84 70 - 99 mg/dL    Comment: Glucose reference range applies only to samples taken after fasting for at least 8 hours.  Glucose, capillary     Status: Abnormal   Collection Time: 06/26/20 11:49 AM  Result Value Ref Range   Glucose-Capillary 195 (H) 70 - 99 mg/dL    Comment: Glucose reference range applies only to samples taken after fasting for at least 8 hours.  Glucose, capillary     Status: Abnormal   Collection Time: 06/26/20  4:14 PM  Result Value Ref Range   Glucose-Capillary 177 (H) 70 - 99 mg/dL    Comment: Glucose reference range applies only to samples taken after fasting for at least 8 hours.  US ARTERIAL ABI  (SCREENING LOWER EXTREMITY)  Result Date: 06/25/2020 CLINICAL DATA:  85 year old female with a history of foot infection EXAM: NONINVASIVE PHYSIOLOGIC VASCULAR STUDY OF BILATERAL LOWER EXTREMITIES TECHNIQUE: Evaluation of both lower extremities was performed at rest, including calculation of ankle-brachial indices, multiple segmental pressure evaluation, segmental Doppler and segmental pulse volume recording. COMPARISON:  None. FINDINGS: Right ABI:  0.74 Left ABI:  0.60 Right Lower Extremity: Monophasic waveforms of the posterior tibial artery and dorsalis pedis. Left Lower Extremity: Monophasic waveforms of the posterior tibial artery and dorsalis pedis. IMPRESSION: Right: Resting ABI in the mild range of occlusive disease, although likely falsely elevated secondary to noncompressive vessels, given the monophasic waveforms at the ankles. Left: Resting ABI in the moderate range occlusive disease, likely falsely elevated secondary to noncompressible vessels given the monophasic waveforms at the ankles. Signed, Yvone NeuJaime S. Reyne DumasWagner, DO, RPVI [facility] Electronically Signed   By: Gilmer MorJaime  Wagner D.O.   On: 06/25/2020 15:45   DG Foot Complete Left  Result Date: 06/25/2020 CLINICAL DATA:  Diabetic patient with a skin ulceration on the left foot. History of prior amputation. EXAM: LEFT FOOT - COMPLETE 3+ VIEW COMPARISON:  Plain films left ankle 11/18/2016. FINDINGS: Again seen is postoperative change of forefoot amputation. There is a large skin ulceration projecting inferior to the cuboid. There is subtle loss of cortical bone along the inferior margin of the cuboid worrisome for osteomyelitis. Multiple small calcifications are seen at the amputation site. On the frontal view, 2 ossific fragments are seen at the wound. IMPRESSION: Large skin wound on the plantar surface of the foot with findings worrisome for osteomyelitis of the cuboid. Electronically Signed    By: Drusilla Kannerhomas  Dalessio M.D.   On: 06/25/2020 11:42     Assessment & Plan:  Sherle PoeBetty L Shuping is a 85 y.o. female with abnormal pulse exam and abnormal ABI in the setting of PVD, DM with a plantar ulcer that is un-stageable and may have associated osteomyelitis. The patient is unsure how aggressive she wants to be. Palliative care is following.   -Vascular to complete angiography for revascularization options  -Will need debridement of the ulcer at the minimum and if we want to verify osteo could always obtain MRI, but patient refuses to go in the tunnel she says  -Given her essential bedridden/ chair ridden status an AKA would be best for contracture development and pressure sore development but she is not interested it this and wants to have the option to walk. A BKA is possible but I do not think she will have the strength to walk with a prosthetic.  -Will continue to follow and see what angiography will show and make plans from there   All questions were answered to the satisfaction of the patient.  Discussed with Dr. Rito EhrlichKrishnan.    Lucretia RoersLindsay C Akito Boomhower 06/26/2020, 6:01 PM

## 2020-06-26 NOTE — Progress Notes (Signed)
Request to IR for LLE angiogram - patient history and imaging reviewed by IR attending, Dr. Loreta Ave, who agrees to procedure.  Plan for patient to be transported via [facility] on Friday (1/7) - patient to arrive at noon for 1 pm procedure. Patient to be NPO after midnight on 1/7, hold Lovenox after 1700 dose on 1/6.  PA will see patient for consult/consent on 1/6. Please call with any questions or concerns.  Lynnette Caffey, PA-C

## 2020-06-26 NOTE — Progress Notes (Signed)
PROGRESS NOTE  Tara Flores BOF:[id number] DOB: April 14, 1936 DOA: 06/25/2020 PCP: Bernerd Limbo, MD  HPI/Recap of past 17 hours: 85 year old female with past medical history of PVD, atrial fibrillation with RVR, COPD, chronic diastolic heart failure, diabetes mellitus, ongoing tobacco abuse and status post partial left foot transmetatarsal amputation on 4/18 who is a long-term care resident at [facility] who presented to the emergency room on 1/4 with redness and swelling in her left foot going up her leg.  In the emergency room, x-ray noted findings of osteomyelitis to the left cuboid.  No evidence of sepsis/sepsis ruled out and patient started on Rocephin and vancomycin and general surgery consulted for evaluation of amputation.  Seen by general surgery who will do an amputation, but if it's possible to salvage foot, recommended vasc surgery input, who advised starting with angiogram.  (Angiogram set for Friday at [facility] by IR.)    Patient when evaluated talked about wanting to save her foot, but also talked about not wanting to go through this all.  Discussed with palliative care who saw her family.  Patient is undecided on her choices at this point.  She complains of some discomfort, but more from being stuck in the bed.  Assessment/Plan: Principal Problem:   Foot osteomyelitis, left [facility]): Previous history of transmetatarsal amputation and with ongoing PVD plus ongoing tobacco use makes it strong possibility that she may not be able to salvage the foot.  Angiogram planned for Friday.  Patient is considering some of her options. Active Problems:   Essential hypertension: Blood pressure stable.    Hypothyroidism: Continue Synthroid.    Anemia due to chronic kidney disease: Hemoglobin stable right now.  She has stage I-2 chronic kidney disease    Atrial fibrillation with RVR (HCC)   Tobacco use disorder: Ongoing.  Counseled.    Chronic diastolic CHF (congestive heart failure)  (HCC): Currently euvolemic.  Stable.  Continue home p.o. Lasix    COPD (chronic obstructive pulmonary disease) (HCC): Stable.  At this time.    Overweight (BMI 25.0-29.9):  Meets criteria BMI greater than 25   Code Status: DNR  Family Communication: Updated daughter, Adrieanna, by phone  Disposition Plan: Depending on findings of angiogram if patient wants to proceed with surgery.   Consultants:  General Surgery  Discussion with Vascular Surgery  IR  Pall Care   Procedures:  None   Antimicrobials:  Iv Rocephin & Vanco 1/4-present   DVT prophylaxis:  Lovenox   Objective: Vitals:   06/26/20 0147 06/26/20 0539  BP: (!) 106/52 (!) 129/57  Pulse: (!) 57 64  Resp: 20 20  Temp: 98.3 F (36.8 C) 98.1 F (36.7 C)  SpO2: 94% 100%    Intake/Output Summary (Last 24 hours) at 06/26/2020 1322 Last data filed at 06/26/2020 0500 Gross per 24 hour  Intake 292.45 ml  Output 300 ml  Net -7.55 ml   Filed Weights   06/25/20 1056 06/26/20 0500  Weight: 68 kg 68 kg   Body mass index is 27.44 kg/m.  Exam:   General: Alert and oriented x3, mild distress secondary to issues with her foot  HEENT: Normocephalic and atraumatic, mucous membranes are moist  Cardiovascular: Irregular rhythm, rate controlled  Respiratory: Clear to auscultation bilaterally  Abdomen: Soft, nontender, nondistended, positive bowel sounds  Musculoskeletal: Status post previous transmetatarsal amputation on left foot.  No clubbing or cyanosis or edema  Skin: Remaining foot is erythematous tracings up left leg  Psychiatry: Appropriate, no evidence  of psychoses   Data Reviewed: CBC: Recent Labs  Lab 06/25/20 1201 06/26/20 0417  WBC 10.4 9.2  NEUTROABS 8.4*  --   HGB 12.8 11.5*  HCT 40.3 36.5  MCV 97.8 97.6  PLT 377 101   Basic Metabolic Panel: Recent Labs  Lab 06/25/20 1201 06/26/20 0417  NA 136 134*  K 4.5 3.6  CL 95* 98  CO2 31 27  GLUCOSE 69* 114*  BUN 27* 25*  CREATININE  0.88 0.74  CALCIUM 9.4 8.8*  MG  --  2.2   GFR: Estimated Creatinine Clearance: 49 mL/min (by C-G formula based on SCr of 0.74 mg/dL). Liver Function Tests: Recent Labs  Lab 06/26/20 0417  AST 13*  ALT 14  ALKPHOS 90  BILITOT 0.5  PROT 7.3  ALBUMIN 2.5*   No results for input(s): LIPASE, AMYLASE in the last 168 hours. No results for input(s): AMMONIA in the last 168 hours. Coagulation Profile: No results for input(s): INR, PROTIME in the last 168 hours. Cardiac Enzymes: No results for input(s): CKTOTAL, CKMB, CKMBINDEX, TROPONINI in the last 168 hours. BNP (last 3 results) No results for input(s): PROBNP in the last 8760 hours. HbA1C: Recent Labs    06/25/20 1550  HGBA1C 9.6*   CBG: Recent Labs  Lab 06/25/20 1729 06/25/20 1828 06/25/20 2022 06/26/20 0806 06/26/20 1149  GLUCAP 63* 109* 91 84 195*   Lipid Profile: No results for input(s): CHOL, HDL, LDLCALC, TRIG, CHOLHDL, LDLDIRECT in the last 72 hours. Thyroid Function Tests: No results for input(s): TSH, T4TOTAL, FREET4, T3FREE, THYROIDAB in the last 72 hours. Anemia Panel: No results for input(s): VITAMINB12, FOLATE, FERRITIN, TIBC, IRON, RETICCTPCT in the last 72 hours. Urine analysis:    Component Value Date/Time   COLORURINE YELLOW 02/09/2017 2030   [place] 02/09/2017 2030   [place] 1.009 02/09/2017 2030   PHURINE 7.0 02/09/2017 2030   GLUCOSEU NEGATIVE 02/09/2017 2030   [place] NEGATIVE 02/09/2017 2030   [place] 02/09/2017 2030   [place] 02/09/2017 2030   PROTEINUR 30 (A) 02/09/2017 2030   UROBILINOGEN 0.2 02/05/2015 0528   NITRITE NEGATIVE 02/09/2017 2030   LEUKOCYTESUR MODERATE (A) 02/09/2017 2030   Sepsis Labs: @LABRCNTIP (procalcitonin:4,lacticidven:4)  ) Recent Results (from the past 240 hour(s))  Wound or Superficial Culture     Status: None (Preliminary result)   Collection Time: 06/25/20 11:35 AM   Specimen: Foot; Wound  Result Value Ref Range Status    Specimen Description   Final    FOOT LEFT Performed at [facility], [address] [place], [place] [address]    Special Requests   Final    NONE Performed at [facility], [address], [place], [place] [address]    Gram Stain   Final    NO WBC SEEN ABUNDANT GRAM POSITIVE COCCI FEW GRAM NEGATIVE RODS    Culture   Final    CULTURE REINCUBATED FOR BETTER GROWTH Performed at [facility], [place] [address] [place],  [address]    Report Status PENDING  Incomplete  Culture, blood (routine x 2)     Status: None (Preliminary result)   Collection Time: 06/25/20 12:01 PM   Specimen: BLOOD  Result Value Ref Range Status   Specimen Description BLOOD BLOOD LEFT HAND  Final   Special Requests   Final    BOTTLES DRAWN AEROBIC AND ANAEROBIC Blood Culture results may not be optimal due to an inadequate volume of blood received in culture bottles   Culture  Final    NO GROWTH < 24 HOURS Performed at [facility], [address] [place], [place] [address]    Report Status PENDING  Incomplete  Culture, blood (routine x 2)     Status: None (Preliminary result)   Collection Time: 06/25/20 12:01 PM   Specimen: BLOOD  Result Value Ref Range Status   Specimen Description BLOOD LEFT ANTECUBITAL  Final   Special Requests   Final    BOTTLES DRAWN AEROBIC AND ANAEROBIC Blood Culture adequate volume   Culture   Final    NO GROWTH < 24 HOURS Performed at [facility], [address], [place], [place] [address]    Report Status PENDING  Incomplete  SARS CORONAVIRUS 2 (TAT 6-24 HRS) Nasopharyngeal Nasopharyngeal Swab     Status: None   Collection Time: 06/25/20  2:00 PM   Specimen: Nasopharyngeal Swab  Result Value Ref Range Status   SARS Coronavirus 2 NEGATIVE NEGATIVE Final    Comment: (NOTE) SARS-CoV-2 target nucleic acids are NOT DETECTED.  The SARS-CoV-2 RNA is generally detectable in upper and lower respiratory specimens during the acute phase of infection. Negative results  do not preclude SARS-CoV-2 infection, do not rule out co-infections with other pathogens, and should not be used as the sole basis for treatment or other patient management decisions. Negative results must be combined with clinical observations, patient history, and epidemiological information. The expected result is Negative.  Fact Sheet for Patients: HairSlick.no  Fact Sheet for Healthcare Providers: quierodirigir.com  This test is not yet approved or cleared by the [place] FDA and  has been authorized for detection and/or diagnosis of SARS-CoV-2 by FDA under an Emergency Use Authorization (EUA). This EUA will remain  in effect (meaning this test can be used) for the duration of the COVID-19 declaration under Se ction 564(b)(1) of the Act, 21 U.S.C. section 360bbb-3(b)(1), unless the authorization is terminated or revoked sooner.  Performed at [facility], [address], [place], [place] [address]   MRSA PCR Screening     Status: Abnormal   Collection Time: 06/26/20  1:32 AM   Specimen: Nasal Mucosa; Nasopharyngeal  Result Value Ref Range Status   MRSA by PCR POSITIVE (A) NEGATIVE Final    Comment:        The GeneXpert MRSA Assay (FDA approved for NASAL specimens only), is one component of a comprehensive MRSA colonization surveillance program. It is not intended to diagnose MRSA infection nor to guide or monitor treatment for MRSA infections. RESULT CALLED TO, READ BACK BY AND VERIFIED WITH: PEACH,RN@0520  06/26/20 [facility] Performed at [facility], [address], [place], [place] [address]       Studies: US ARTERIAL ABI (SCREENING LOWER EXTREMITY)  Result Date: 06/25/2020 CLINICAL DATA:  85 year old female with a history of foot infection EXAM: NONINVASIVE PHYSIOLOGIC VASCULAR STUDY OF BILATERAL LOWER EXTREMITIES TECHNIQUE: Evaluation of both lower extremities was performed at rest, including  calculation of ankle-brachial indices, multiple segmental pressure evaluation, segmental Doppler and segmental pulse volume recording. COMPARISON:  None. FINDINGS: Right ABI:  0.74 Left ABI:  0.60 Right Lower Extremity: Monophasic waveforms of the posterior tibial artery and dorsalis pedis. Left Lower Extremity: Monophasic waveforms of the posterior tibial artery and dorsalis pedis. IMPRESSION: Right: Resting ABI in the mild range of occlusive disease, although likely falsely elevated secondary to noncompressive vessels, given the monophasic waveforms at the ankles. Left: Resting ABI in the moderate range occlusive disease, likely falsely elevated secondary to noncompressible vessels given the  monophasic waveforms at the ankles. Signed, Yvone Neu. Reyne Dumas, RPVI [facility] Radiology Electronically Signed   By: Gilmer Mor D.O.   On: 06/25/2020 15:45    Scheduled Meds: . (feeding supplement) PROSource Plus  30 mL Oral Daily  . bisoprolol  5 mg Oral Daily  . calcium-vitamin D  1 tablet Oral Daily  . Chlorhexidine Gluconate Cloth  6 each Topical Q0600  . diltiazem  120 mg Oral Daily  . enoxaparin (LOVENOX) injection  70 mg Subcutaneous Q12H  . furosemide  60 mg Oral BID  . insulin aspart  0-5 Units Subcutaneous QHS  . insulin aspart  0-9 Units Subcutaneous TID WC  . insulin NPH Human  10 Units Subcutaneous BID AC & HS  . isosorbide mononitrate  15 mg Oral Daily  . levothyroxine  137 mcg Oral Daily  . multivitamin with minerals  1 tablet Oral Daily  . mupirocin ointment  1 application Nasal BID  . nicotine  7 mg Transdermal Daily  . pravastatin  20 mg Oral Daily  . sodium chloride flush  3 mL Intravenous Q12H  . spironolactone  12.5 mg Oral BID  . zinc sulfate  220 mg Oral Daily    Continuous Infusions: . sodium chloride    . cefTRIAXone (ROCEPHIN)  IV 2 g (06/25/20 1809)  . vancomycin       LOS: 1 day     Hollice Espy, MD [facility]   06/26/2020, 1:22 PM

## 2020-06-26 NOTE — TOC Initial Note (Addendum)
Transition of Care [facility]) - Initial/Assessment Note   Patient Details  Name: Tara Flores MRN: [id number] Date of Birth: [birth year]  [facility] CM/SW Contact:    Ewing Schlein, LCSW Phone Number: 06/26/2020, 11:07 AM  Clinical Narrative: Patient is an 85 year old female who was admitted for osteomyelitis. Patient has a history of chronic kidney disease, TIA, atrial fibrillation with RVR, COPD, chronic diastolic congestive heart failure, type 2 diabetes mellitus, and pulmonary emphysema. Readmission checklist completed with patient due to high readmission score, but checklist is not currently available in patient's chart.  Per patient, she resides at [facility] as a LTC resident and wants to return at discharge. Patient's DME includes a wheelchair and patient reported she is mostly independent with ADLs at baseline. Patient reported no issues with her medications. The facility assists patient with PCP appointments and transportation. CSW left voicemail for Melissa with [facility] requesting call back. TOC to follow for discharge needs.  Addendum: CSW spoke with [place] at [facility]. Patient can return to the facility at discharge. FL2 started.  Expected Discharge Plan: Skilled Nursing Facility Barriers to Discharge: Continued Medical Work up  Patient Goals and CMS Choice Patient states their goals for this hospitalization and ongoing recovery are:: Return to [facility] [email or web address] Compare Post Acute Care list provided to:: Patient Choice offered to / list presented to : Patient  Expected Discharge Plan and Services Expected Discharge Plan: Skilled Nursing Facility In-house Referral: Clinical Social Work Discharge Planning Services: NA Post Acute Care Choice: Skilled Nursing Facility Living arrangements for the past 2 months: Skilled Nursing Facility                 DME Arranged: N/A DME Agency: NA HH Arranged: NA HH Agency: NA  Prior Living  Arrangements/Services Living arrangements for the past 2 months: Skilled Nursing Facility Lives with:: Facility Resident Patient language and need for interpreter reviewed:: Yes Do you feel safe going back to the place where you live?: Yes      Need for Family Participation in Patient Care: No (Comment) Care giver support system in place?: Yes (comment) Current home services: DME Furniture conservator/restorer) Criminal Activity/Legal Involvement Pertinent to Current Situation/Hospitalization: No - Comment as needed  Activities of Daily Living Home Assistive Devices/Equipment: Wheelchair ADL Screening (condition at time of admission) Patient's cognitive ability adequate to safely complete daily activities?: Yes Is the patient deaf or have difficulty hearing?: No Does the patient have difficulty seeing, even when wearing glasses/contacts?: Yes Does the patient have difficulty concentrating, remembering, or making decisions?: No Patient able to express need for assistance with ADLs?: Yes Does the patient have difficulty dressing or bathing?: Yes Independently performs ADLs?: No Communication: Independent Dressing (OT): Needs assistance Is this a change from baseline?: Pre-admission baseline Grooming: Needs assistance Is this a change from baseline?: Pre-admission baseline Feeding: Independent Bathing: Needs assistance Is this a change from baseline?: Pre-admission baseline Toileting: Needs assistance Is this a change from baseline?: Pre-admission baseline In/Out Bed: Needs assistance Is this a change from baseline?: Pre-admission baseline Walks in Home: Needs assistance Is this a change from baseline?: Pre-admission baseline Does the patient have difficulty walking or climbing stairs?: Yes Weakness of Legs: Both Weakness of Arms/Hands: None  Permission Sought/Granted Permission sought to share information with : Facility Industrial/product designer granted to share information with : Yes,  Verbal Permission Granted Permission granted to share info w AGENCY: [place]  Emotional Assessment Appearance:: Appears stated age Attitude/Demeanor/Rapport:  Engaged Affect (typically observed): Accepting Orientation: : Oriented to Self,Oriented to Place,Oriented to  Time,Oriented to Situation Alcohol / Substance Use: Not Applicable Psych Involvement: No (comment)  Admission diagnosis:  PVD (peripheral vascular disease) (HCC) [I73.9] Osteomyelitis (HCC) [M86.9] Osteomyelitis of left foot, unspecified type [facility]) [M86.9] Patient Active Problem List   Diagnosis Date Noted  . Osteomyelitis (HCC) 06/25/2020  . Tobacco use disorder 02/10/2017  . Pulmonary emphysema (HCC)   . Acute on chronic diastolic (congestive) heart failure (HCC) 02/09/2017  . Type 2 diabetes mellitus with hypoglycemia (HCC) 02/09/2017  . New onset a-fib (HCC) 11/20/2016  . Atrial fibrillation with RVR (HCC)   . COPD exacerbation (HCC)   . Goals of care, counseling/discussion   . Palliative care by specialist   . TIA (transient ischemic attack) 11/19/2016  . Diabetic foot ulcer (HCC) 10/10/2016  . Hypokalemia 10/10/2016  . Anemia due to chronic kidney disease 10/10/2016  . Cellulitis in diabetic foot (HCC) 10/09/2016  . Venous ulcer of left leg (HCC) 07/28/2016  . Dry skin dermatitis 07/28/2016  . Type 2 diabetes mellitus with hyperglycemia (HCC) 02/06/2015  . Hip fracture (HCC) 02/05/2015  . Closed right hip fracture (HCC) 02/05/2015  . Abnormal EKG 02/05/2015  . Tobacco abuse 02/05/2015  . DM2 (diabetes mellitus, type 2) (HCC) 02/05/2015  . Essential hypertension 02/05/2015  . Hypothyroidism 02/05/2015  . Coronary artery disease involving native coronary artery of native heart without angina pectoris    PCP:  Bernerd Limbo, MD Pharmacy:   [facility] [address]), [place] - [address] [place] ([place]) [place] [address] Phone: [phone] Fax:  [phone]  [facility] [place], [place] - [address] [place], [address] [place], [address] [place] [address] Phone: [phone] Fax: [phone]  Readmission Risk Interventions No flowsheet data found.

## 2020-06-26 NOTE — Consult Note (Signed)
Consultation Note Date: 06/26/2020   Patient Name: Tara Flores  DOB: [birth year]  MRN: [id number]  Age / Sex: 85 y.o., female  PCP: Hilbert Corrigan, MD Referring Physician: Annita Brod, MD  Reason for Consultation: Establishing goals of care and Psychosocial/spiritual support  HPI/Patient Profile: 86 y.o. 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   Clinical Assessment and Goals of Care: I have reviewed medical records including EPIC notes, labs and imaging, received report from attending, examined the patient and met at bedside with Tara Flores, to discuss diagnosis prognosis, GOC, EOL wishes, disposition and options.  I introduced Palliative Medicine as specialized medical care for people living with serious illness. It focuses on providing relief from the symptoms and stress of a serious illness.  Tara Flores was seen by the palliative team in 2018.  We discussed a brief life review of the patient.  Tara Flores shares that she has been at [facility] under long-term care for about 3 years.  As far as functional and nutritional status per chart, she is somewhat independent at the long-term care facility, although she does use a wheelchair.  We discussed her current illness and what it means in the larger context of her on-going co-morbidities.  Natural disease trajectory and expectations at EOL were discussed.  Initially, Tara Flores shares that she would "rather die" than to have her foot amputated.  As we talked more, she states that if she has to have the surgery she would do so.  She is clearly conflicted.  At this point she shares that she would like to try to "save" her foot.  I reassure  her that further testing is being done, we are talking about "the what if's and maybe's".  The difference between aggressive medical intervention and comfort care was considered in light of the patient's goals of care.  I shared that the medical team will respect her choice.  If she chooses to not have surgery, we anticipate that her infection will worsen, spread throughout her body, and that she will die.  I share that the medical team would make sure that she is comfortable, not experiencing pain or anxiety.  I share that she would qualify for hospice care.  Hospice and Palliative Care services outpatient were explained and offered.  Tara Flores shares that her daughter, Tara Flores, works for hospice.  I shared that if she chooses did not have surgical amputation, she would qualify for hospice care.  Questions and concerns were addressed.  The family was encouraged to call with questions or concerns.   Conference with attending, bedside nursing staff, transition of care team related to patient condition, needs, goals of care.   HCPOA  NEXT OF KIN -Tara Flores is her own Tara Flores.  She is alert and oriented and able to make her needs known.  Her healthcare surrogate would be  her adult children.    SUMMARY OF RECOMMENDATIONS   At this point continue to treat the treatable but no CPR or intubation. Initially, the goal is to try to save her foot Considering whether she would accept amputation versus hospice care   Code Status/Advance Care Planning:  DNR  Symptom Management:   Per hospitalist, no additional needs at this time.  Palliative Prophylaxis:   Frequent Pain Assessment, Palliative Wound Care and Turn Reposition  Additional Recommendations (Limitations, Scope, Preferences):  Treat the treatable but no CPR or intubation.  Considering whether she will accept surgery  Psycho-social/Spiritual:   Desire for further Chaplaincy support:no  Additional Recommendations:  Caregiving  Support/Resources and Education on Hospice  Prognosis:   Unable to determine, based on outcomes.  3 to 6 months would be expected if she declines to have surgical amputation with cellulitis/osteomyelitis of left foot.  Discharge Planning: Anticipate return to long-term care center, [facility].      Primary Diagnoses: Present on Admission: . Foot osteomyelitis, left ([place]) . Anemia due to chronic kidney disease . Atrial fibrillation with RVR ([place]) . Essential hypertension . Tobacco use disorder . Hypothyroidism . Chronic diastolic CHF (congestive heart failure) ([place]) . COPD (chronic obstructive pulmonary disease) ([place]) . Overweight (BMI 25.0-29.9)   I have reviewed the medical record, interviewed the patient and family, and examined the patient. The following aspects are pertinent.  Past Medical History:  Diagnosis Date  . Acquired absence of left foot ([place])   . Anemia in chronic kidney disease   . Anxiety   . Arthritis   . Asthma   . Atherosclerotic heart disease   . Atrial fibrillation ([place])   . CAD (coronary artery disease)   . Cardiac disease   . CHF (congestive heart failure) ([place])   . CHF (congestive heart failure) ([place])   . COPD (chronic obstructive pulmonary disease) ([place])   . Depression   . Diabetes mellitus without complication ([place])   . Diabetic neuropathy ([place])   . Emphysema of lung ([place])   . Hyperlipidemia   . Hypertension   . Hypothyroidism   . Mild cognitive impairment   . Peripheral vascular disease ([place])   . TIA (transient ischemic attack)    Social History   Socioeconomic History  . Marital status: Widowed    Spouse name: Not on file  . Number of children: Not on file  . Years of education: Not on file  . Highest education level: Not on file  Occupational History  . Not on file  Tobacco Use  . Smoking status: Current Every Day Smoker    Packs/day: 0.00    Years: 63.00    Pack years: 0.00    Types: Cigarettes  . Smokeless  tobacco: Never Used  . Tobacco comment: 12 cigarettes daily  Vaping Use  . Vaping Use: Never used  Substance and Sexual Activity  . Alcohol use: Not Currently    Alcohol/week: 0.0 standard drinks    Comment: 02/09/2017 "used to have a glass of wine at night; stopped cause I take so many pills"  . Drug use: No  . Sexual activity: Never  Other Topics Concern  . Not on file  Social History Narrative   Diet:      Do you drink/ eat things with caffeine? yes      Marital status:  widowed  What year were you married ? 1969      Do you live in a house, apartment,assistred living, condo, trailer, etc.)?trailer      Is it one or more stories? no      How many persons live in your home ? none      Do you have any pets in your home ?(please list) 1 dog      Current or past profession: LPN      Do you exercise?  yes                            Type & how often: with PT      Do you have a living will? no      Do you have a DNR form? no                      If not, do you want to discuss one? yes      Do you have signed POA?HPOA forms?   no              If so, please bring to your        appointment      Social Determinants of Health   Financial Resource Strain: Not on file  Food Insecurity: Not on file  Transportation Needs: Not on file  Physical Activity: Not on file  Stress: Not on file  Social Connections: Not on file   Family History  Problem Relation Age of Onset  . Alcohol abuse Father   . Cancer Mother        lung  . Heart disease Mother   . Diabetes Sister   . Heart disease Sister    Scheduled Meds: . (feeding supplement) PROSource Plus  30 mL Oral Daily  . bisoprolol  5 mg Oral Daily  . calcium-vitamin D  1 tablet Oral Daily  . Chlorhexidine Gluconate Cloth  6 each Topical Q0600  . diltiazem  120 mg Oral Daily  . enoxaparin (LOVENOX) injection  70 mg Subcutaneous Q12H  . furosemide  60 mg Oral BID  . insulin aspart  0-5 Units  Subcutaneous QHS  . insulin aspart  0-9 Units Subcutaneous TID WC  . insulin NPH Human  10 Units Subcutaneous BID AC & HS  . isosorbide mononitrate  15 mg Oral Daily  . levothyroxine  137 mcg Oral Daily  . multivitamin with minerals  1 tablet Oral Daily  . mupirocin ointment  1 application Nasal BID  . nicotine  7 mg Transdermal Daily  . pravastatin  20 mg Oral Daily  . sodium chloride flush  3 mL Intravenous Q12H  . spironolactone  12.5 mg Oral BID  . zinc sulfate  220 mg Oral Daily   Continuous Infusions: . sodium chloride    . cefTRIAXone (ROCEPHIN)  IV 2 g (06/25/20 1809)  . vancomycin     PRN Meds:.sodium chloride, acetaminophen **OR** acetaminophen, ALPRAZolam, levalbuterol, nitroGLYCERIN, ondansetron **OR** ondansetron (ZOFRAN) IV, sodium chloride flush Medications Prior to Admission:  Prior to Admission medications   Medication Sig Start Date End Date Taking? Authorizing Provider  acetaminophen (TYLENOL) 325 MG tablet Take 650 mg by mouth every 4 (four) hours as needed for mild pain.   Yes [provider]  ALPRAZolam (XANAX) 0.25 MG tablet Take 1 tablet (0.25 mg total) by mouth 2 (two) times daily as needed for anxiety. 02/16/17  Yes Irine Seal  V, MD  Amino Acids-Protein Hydrolys (FEEDING SUPPLEMENT, PRO-STAT SUGAR FREE 64,) LIQD Take 30 mLs by mouth daily.   Yes [provider]  apixaban (ELIQUIS) 5 MG TABS tablet Take 1 tablet (5 mg total) by mouth 2 (two) times daily. 11/23/16  Yes Verner Mould, MD  Ascorbic Acid (VITAMIN C PO) Take 500 mg by mouth 2 (two) times daily.   Yes [provider]  bisoprolol (ZEBETA) 5 MG tablet Take 1 tablet (5 mg total) by mouth daily. 02/17/17  Yes Eugenie Filler, MD  BYETTA 10 MCG PEN 10 MCG/0.04ML SOPN injection Inject 10 mcg into the skin in the morning and at bedtime. 05/30/20  Yes [provider]  Calcium Carb-Cholecalciferol 500-400 MG-UNIT TABS Take 1 tablet by mouth daily.   Yes  [provider]  COMBIVENT RESPIMAT 20-100 MCG/ACT AERS respimat  07/27/19  Yes [provider]  diltiazem (TIAZAC) 240 MG 24 hr capsule Take 120 mg by mouth daily.   Yes [provider]  doxycycline (VIBRA-TABS) 100 MG tablet Take 100 mg by mouth 2 (two) times daily. For 7 days 06/23/20  Yes [provider]  furosemide (LASIX) 20 MG tablet Take 3 tablets (60 mg total) by mouth 2 (two) times daily. Patient taking differently: Take 80 mg by mouth 2 (two) times daily. 02/16/17  Yes Eugenie Filler, MD  Infant Care Products (BABY SHAMPOO EX) Apply 1 application topically daily. Apply to eyelids every morning for irritation   Yes [provider]  insulin lispro (HUMALOG) 100 UNIT/ML injection Inject 5-10 Units into the skin 3 (three) times daily as needed. Take 5 units every evening and as needed with meals per sliding scale; sliding scale - blood glucose 71-150 give 0 units, 151-200 give 2 units, 201-250 give 4 units. 251-300 give 6 units, 301-350 give 8 units, and for 351-400 give 10 units and call provider   Yes [provider]  insulin NPH Human (HUMULIN N,NOVOLIN N) 100 UNIT/ML injection Inject 20-30 Units into the skin 2 (two) times daily. 40units in the morning and 20 units bedtime   Yes [provider]  isosorbide mononitrate (IMDUR) 30 MG 24 hr tablet Take 0.5 tablets (15 mg total) by mouth daily. 02/17/17  Yes Eugenie Filler, MD  JARDIANCE 10 MG TABS tablet Take 10 mg by mouth daily. 10/05/19  Yes [provider]  Multiple Vitamin (MULTIVITAMIN WITH MINERALS) TABS tablet Take 1 tablet by mouth daily.   Yes [provider]  nitroGLYCERIN (NITROSTAT) 0.4 MG SL tablet Place 1 tablet (0.4 mg total) under the tongue every 5 (five) minutes as needed for chest pain. 07/18/15  Yes Josue Hector, MD  pravastatin (PRAVACHOL) 20 MG tablet Take 20 mg by mouth daily. 09/27/19  Yes [provider]  PROAIR HFA 108 [phone] Base)  MCG/ACT inhaler  10/17/19  Yes [provider]  spironolactone (ALDACTONE) 25 MG tablet Take 0.5 tablets (12.5 mg total) by mouth 2 (two) times daily. Patient taking differently: Take 25 mg by mouth 2 (two) times daily. 02/16/17  Yes Eugenie Filler, MD  SYNTHROID 137 MCG tablet Take 137 mcg by mouth daily. 09/29/19  Yes [provider]  zinc sulfate 220 (50 Zn) MG capsule Take 220 mg by mouth daily.   Yes [provider]  lisinopril (PRINIVIL,ZESTRIL) 2.5 MG tablet Take 1 tablet (2.5 mg total) by mouth daily. Patient not taking: Reported on 06/25/2020 02/17/17   Eugenie Filler, MD   Allergies  Allergen Reactions  . Avelox [Moxifloxacin]   . Brethine [Terbutaline]     Made patient confused  . Keflex [Cephalexin] Nausea Only    Loss of appetite   Review of Systems  Unable to perform ROS: Age    Physical Exam Vitals and nursing note reviewed.  Constitutional:      General: She is not in acute distress.    Appearance: Normal appearance. She is normal weight. She is ill-appearing.  HENT:     Head: Normocephalic and atraumatic.     Mouth/Throat:     Mouth: Mucous membranes are moist.  Cardiovascular:     Rate and Rhythm: Normal rate.  Pulmonary:     Effort: Pulmonary effort is normal. No respiratory distress.  Musculoskeletal:     Comments: Left foot wound, partial amputation  Skin:    General: Skin is warm and dry.  Neurological:     Mental Status: She is alert and oriented to person, place, and time.  Psychiatric:        Mood and Affect: Mood normal.        Behavior: Behavior normal.     Vital Signs: BP (!) 129/57 (BP Location: Left Arm)   Pulse 64   Temp 98.1 F (36.7 C) (Oral)   Resp 20   Ht _0  (1.575 m)   Wt 68 kg   SpO2 100%   BMI 27.44 kg/m  Pain Scale: 0-10   Pain Score: 0-No pain   SpO2: SpO2: 100 % O2 Device:SpO2: 100 % O2 Flow Rate: .   IO: Intake/output summary:   Intake/Output Summary (Last 24 hours) at 06/26/2020  1321 Last data filed at 06/26/2020 0500 Gross per 24 hour  Intake 292.45 ml  Output 300 ml  Net -7.55 ml    LBM: Last BM Date: 06/25/20 Baseline Weight: Weight: 68 kg Most recent weight: Weight: 68 kg     Palliative Assessment/Data:   Flowsheet Rows   Flowsheet Row Most Recent Value  Intake Tab   Referral Department Hospitalist  Unit at Time of Referral Med/Surg Unit  Palliative Care Primary Diagnosis Other (Comment)  Date Notified 06/26/20  Palliative Care Type Return patient Palliative Care  Reason for referral Clarify Goals of Care  Date of Admission 06/25/20  Date first seen by Palliative Care 06/26/20  # of days Palliative referral response time 0 Day(s)  # of days IP prior to Palliative referral 1  Clinical Assessment   Palliative Performance Scale Score 40%  Pain Max last 24 hours Not able to report  Pain Min Last 24 hours Not able to report  Dyspnea Max Last 24 Hours Not able to report  Dyspnea Min Last 24 hours Not able to report  Psychosocial & Spiritual Assessment   Palliative Care Outcomes       Time In: 1130 Time Out: 1220 Time Total: 50 minutes  Greater than 50%  of this time was spent counseling and coordinating care related to the above assessment and plan.  Signed by: Drue Novel, NP   Please contact Palliative Medicine Team phone at [phone] for questions and concerns.  For individual provider: See Shea Evans

## 2020-06-27 ENCOUNTER — Inpatient Hospital Stay (HOSPITAL_COMMUNITY): Payer: Medicare Other

## 2020-06-27 DIAGNOSIS — M868X7 Other osteomyelitis, ankle and foot: Secondary | ICD-10-CM | POA: Diagnosis not present

## 2020-06-27 DIAGNOSIS — I5032 Chronic diastolic (congestive) heart failure: Secondary | ICD-10-CM

## 2020-06-27 DIAGNOSIS — Z7189 Other specified counseling: Secondary | ICD-10-CM | POA: Diagnosis not present

## 2020-06-27 DIAGNOSIS — E663 Overweight: Secondary | ICD-10-CM

## 2020-06-27 DIAGNOSIS — I4891 Unspecified atrial fibrillation: Secondary | ICD-10-CM

## 2020-06-27 DIAGNOSIS — Z515 Encounter for palliative care: Secondary | ICD-10-CM | POA: Diagnosis not present

## 2020-06-27 LAB — AEROBIC CULTURE W GRAM STAIN (SUPERFICIAL SPECIMEN): Gram Stain: NONE SEEN

## 2020-06-27 LAB — GLUCOSE, CAPILLARY
Glucose-Capillary: 212 mg/dL — ABNORMAL HIGH (ref 70–99)
Glucose-Capillary: 301 mg/dL — ABNORMAL HIGH (ref 70–99)
Glucose-Capillary: 251 mg/dL — ABNORMAL HIGH (ref 70–99)
Glucose-Capillary: 122 mg/dL — ABNORMAL HIGH (ref 70–99)

## 2020-06-27 MED ORDER — IOHEXOL 350 MG/ML SOLN
80.0000 mL | Freq: Once | INTRAVENOUS | Status: AC | PRN
  Administered 2020-06-27: 80 mL via INTRAVENOUS

## 2020-06-27 MED ORDER — INSULIN NPH (HUMAN) (ISOPHANE) 100 UNIT/ML ~~LOC~~ SUSP
14.0000 [IU] | Freq: Two times a day (BID) | SUBCUTANEOUS | Status: DC
  Administered 2020-06-27 – 2020-07-01 (×7): 14 [IU] via SUBCUTANEOUS
  Filled 2020-06-27 (×3): qty 10

## 2020-06-27 MED ORDER — COLLAGENASE 250 UNIT/GM EX OINT
TOPICAL_OINTMENT | Freq: Two times a day (BID) | CUTANEOUS | Status: DC
  Administered 2020-06-29: 1 via TOPICAL
  Filled 2020-06-27: qty 30

## 2020-06-27 NOTE — Consult Note (Signed)
Chief Complaint: Patient was seen in consultation today for Bilateral lower extremity  arteriogram with possible LLE intervention Chief Complaint  Patient presents with  . Foot Problem   at the request of Dr Chancy Milroy  Referring Physician(s): Dr Baltazar Apo  Supervising Physician: Gilmer Mor  Patient Status: [facility] IP  History of Present Illness: Tara Flores is a 85 y.o. female   Hx Afib; COPD; CHF; HTN; DM PVD + smoker Has had previous Left transmetatarsal amputation 2018 Secondary osteomyelitis She lives in a SNF Uses partial foot to transition from bed to chair Uses WC always  Has noted left foot ulcer for weeks Weeping and red/swollen Has failed antibiotic treatment - Doxy Admitted now with osteomyelitis  Imaging: 1/4: IMPRESSION: Large skin wound on the plantar surface of the foot with findings worrisome for osteomyelitis of the cuboid.  ABI 1/4: IMPRESSION:   Right:  Resting ABI in the mild range of occlusive disease, although likely falsely elevated secondary to noncompressive vessels, given the monophasic waveforms at the ankles. Left:  Resting ABI in the moderate range occlusive disease, likely falsely elevated secondary to noncompressible vessels given the monophasic waveforms at the ankles  Surgery has recommended arteriogram to evaluate flow and possibility for revascularization.  Dr Loreta Ave has reviewed imaging and approves procedure Scheduled for Bilateral lower extremity arteriogram with possible intervention to be performed in IR [facility] 1/7   Past Medical History:  Diagnosis Date  . Acquired absence of left foot (HCC)   . Anemia in chronic kidney disease   . Anxiety   . Arthritis   . Asthma   . Atherosclerotic heart disease   . Atrial fibrillation (HCC)   . CAD (coronary artery disease)   . Cardiac disease   . CHF (congestive heart failure) (HCC)   . CHF (congestive heart failure) (HCC)   . COPD (chronic obstructive pulmonary disease)  (HCC)   . Depression   . Diabetes mellitus without complication (HCC)   . Diabetic neuropathy (HCC)   . Emphysema of lung (HCC)   . Hyperlipidemia   . Hypertension   . Hypothyroidism   . Mild cognitive impairment   . Peripheral vascular disease (HCC)   . TIA (transient ischemic attack)     Past Surgical History:  Procedure Laterality Date  . AMPUTATION Left 10/12/2016   Procedure: TRANSMETATARSAL AMPUTATION LEFT FOOT WITH ACHILLES TENDON LENGTHENING;  Surgeon: Felecia Shelling, DPM;  Location: [facility] OR;  Service: Podiatry;  Laterality: Left;  . APPENDECTOMY    . CARDIAC CATHETERIZATION N/A 02/05/2015   Procedure: Left Heart Cath and Coronary Angiography;  Surgeon: Kathleene Hazel, MD;  Location: [facility] INVASIVE CV LAB;  Service: Cardiovascular;  Laterality: N/A;  . EYE SURGERY  2006   unsure if exact procedure  . HIP FRACTURE SURGERY    . INTRAMEDULLARY (IM) NAIL INTERTROCHANTERIC Right 02/06/2015   Procedure: INTRAMEDULLARY (IM) NAIL RIGHT HIP;  Surgeon: Tarry Kos, MD;  Location: [facility] OR;  Service: Orthopedics;  Laterality: Right;  . JOINT REPLACEMENT     due to hip fracture    Allergies: Avelox [moxifloxacin], Brethine [terbutaline], and Keflex [cephalexin]  Medications: Prior to Admission medications   Medication Sig Start Date End Date Taking? Authorizing Provider  acetaminophen (TYLENOL) 325 MG tablet Take 650 mg by mouth every 4 (four) hours as needed for mild pain.   Yes [provider]  ALPRAZolam (XANAX) 0.25 MG tablet Take 1 tablet (0.25 mg total) by mouth 2 (two) times daily as  needed for anxiety. 02/16/17  Yes Rodolph Bong, MD  Amino Acids-Protein Hydrolys (FEEDING SUPPLEMENT, PRO-STAT SUGAR FREE 64,) LIQD Take 30 mLs by mouth daily.   Yes [provider]  apixaban (ELIQUIS) 5 MG TABS tablet Take 1 tablet (5 mg total) by mouth 2 (two) times daily. 11/23/16  Yes Marquette Saa, MD  Ascorbic Acid (VITAMIN C PO) Take 500 mg by mouth 2 (two)  times daily.   Yes [provider]  bisoprolol (ZEBETA) 5 MG tablet Take 1 tablet (5 mg total) by mouth daily. 02/17/17  Yes Rodolph Bong, MD  BYETTA 10 MCG PEN 10 MCG/0.04ML SOPN injection Inject 10 mcg into the skin in the morning and at bedtime. 05/30/20  Yes [provider]  Calcium Carb-Cholecalciferol 500-400 MG-UNIT TABS Take 1 tablet by mouth daily.   Yes [provider]  COMBIVENT RESPIMAT 20-100 MCG/ACT AERS respimat  07/27/19  Yes [provider]  diltiazem (TIAZAC) 240 MG 24 hr capsule Take 120 mg by mouth daily.   Yes [provider]  doxycycline (VIBRA-TABS) 100 MG tablet Take 100 mg by mouth 2 (two) times daily. For 7 days 06/23/20  Yes [provider]  furosemide (LASIX) 20 MG tablet Take 3 tablets (60 mg total) by mouth 2 (two) times daily. Patient taking differently: Take 80 mg by mouth 2 (two) times daily. 02/16/17  Yes Rodolph Bong, MD  Infant Care Products (BABY SHAMPOO EX) Apply 1 application topically daily. Apply to eyelids every morning for irritation   Yes [provider]  insulin lispro (HUMALOG) 100 UNIT/ML injection Inject 5-10 Units into the skin 3 (three) times daily as needed. Take 5 units every evening and as needed with meals per sliding scale; sliding scale - blood glucose 71-150 give 0 units, 151-200 give 2 units, 201-250 give 4 units. 251-300 give 6 units, 301-350 give 8 units, and for 351-400 give 10 units and call provider   Yes [provider]  insulin NPH Human (HUMULIN N,NOVOLIN N) 100 UNIT/ML injection Inject 20-30 Units into the skin 2 (two) times daily. 40units in the morning and 20 units bedtime   Yes [provider]  isosorbide mononitrate (IMDUR) 30 MG 24 hr tablet Take 0.5 tablets (15 mg total) by mouth daily. 02/17/17  Yes Rodolph Bong, MD  JARDIANCE 10 MG TABS tablet Take 10 mg by mouth daily. 10/05/19  Yes [provider]  Multiple Vitamin (MULTIVITAMIN  WITH MINERALS) TABS tablet Take 1 tablet by mouth daily.   Yes [provider]  nitroGLYCERIN (NITROSTAT) 0.4 MG SL tablet Place 1 tablet (0.4 mg total) under the tongue every 5 (five) minutes as needed for chest pain. 07/18/15  Yes Wendall Stade, MD  pravastatin (PRAVACHOL) 20 MG tablet Take 20 mg by mouth daily. 09/27/19  Yes [provider]  PROAIR HFA 108 [phone] Base) MCG/ACT inhaler  10/17/19  Yes [provider]  spironolactone (ALDACTONE) 25 MG tablet Take 0.5 tablets (12.5 mg total) by mouth 2 (two) times daily. Patient taking differently: Take 25 mg by mouth 2 (two) times daily. 02/16/17  Yes Rodolph Bong, MD  SYNTHROID 137 MCG tablet Take 137 mcg by mouth daily. 09/29/19  Yes [provider]  zinc sulfate 220 (50 Zn) MG capsule Take 220 mg by mouth daily.   Yes [provider]  lisinopril (PRINIVIL,ZESTRIL) 2.5 MG tablet Take 1 tablet (2.5 mg total) by mouth daily. Patient not taking: Reported on 06/25/2020 02/17/17  Rodolph Bong, MD     Family History  Problem Relation Age of Onset  . Alcohol abuse Father   . Cancer Mother        lung  . Heart disease Mother   . Diabetes Sister   . Heart disease Sister     Social History   Socioeconomic History  . Marital status: Widowed    Spouse name: Not on file  . Number of children: Not on file  . Years of education: Not on file  . Highest education level: Not on file  Occupational History  . Not on file  Tobacco Use  . Smoking status: Current Every Day Smoker    Packs/day: 0.00    Years: 63.00    Pack years: 0.00    Types: Cigarettes  . Smokeless tobacco: Never Used  . Tobacco comment: 12 cigarettes daily  Vaping Use  . Vaping Use: Never used  Substance and Sexual Activity  . Alcohol use: Not Currently    Alcohol/week: 0.0 standard drinks    Comment: 02/09/2017 "used to have a glass of wine at night; stopped cause I take so many pills"  . Drug use: No  . Sexual activity:  Never  Other Topics Concern  . Not on file  Social History Narrative   Diet:      Do you drink/ eat things with caffeine? yes      Marital status:  widowed                             What year were you married ? 1969      Do you live in a house, apartment,assistred living, condo, trailer, etc.)?trailer      Is it one or more stories? no      How many persons live in your home ? none      Do you have any pets in your home ?(please list) 1 dog      Current or past profession: LPN      Do you exercise?  yes                            Type & how often: with PT      Do you have a living will? no      Do you have a DNR form? no                      If not, do you want to discuss one? yes      Do you have signed POA?HPOA forms?   no              If so, please bring to your        appointment      Social Determinants of Health   Financial Resource Strain: Not on file  Food Insecurity: Not on file  Transportation Needs: Not on file  Physical Activity: Not on file  Stress: Not on file  Social Connections: Not on file     Review of Systems: A 12 point ROS discussed and pertinent positives are indicated in the HPI above.  All other systems are negative.  Review of Systems  Constitutional: Positive for activity change. Negative for fatigue and fever.  Respiratory: Negative for cough and shortness of breath.   Cardiovascular: Negative for chest pain.  Gastrointestinal: Negative for abdominal pain.  Musculoskeletal: Positive for gait problem.  Psychiatric/Behavioral: Negative for behavioral problems and confusion.    Vital Signs: BP 129/63 (BP Location: Left Arm)   Pulse 61   Temp 98.3 F (36.8 C) (Oral)   Resp 20   Ht 5\' 2"  (1.575 m)   Wt 162 lb 4.1 oz (73.6 kg)   SpO2 92%   BMI 29.68 kg/m   Physical Exam Vitals reviewed.  HENT:     Mouth/Throat:     Mouth: Mucous membranes are moist.  Cardiovascular:     Rate and Rhythm: Normal rate. Rhythm irregular.      Heart sounds: Normal heart sounds.  Pulmonary:     Effort: Pulmonary effort is normal.     Breath sounds: Normal breath sounds.  Abdominal:     Palpations: Abdomen is soft.  Musculoskeletal:        General: Swelling and tenderness present.     Comments: LLE redness; swelling Left partial foot wrapped +odorous drainage on dressing  Skin:    General: Skin is warm.  Neurological:     Mental Status: She is alert and oriented to person, place, and time.  Psychiatric:        Behavior: Behavior normal.     Imaging: ARTERIAL ABI (SCREENING LOWER EXTREMITY)  Result Date: 06/25/2020 CLINICAL DATA:  85 year old female with a history of foot infection EXAM: NONINVASIVE PHYSIOLOGIC VASCULAR STUDY OF BILATERAL LOWER EXTREMITIES TECHNIQUE: Evaluation of both lower extremities was performed at rest, including calculation of ankle-brachial indices, multiple segmental pressure evaluation, segmental Doppler and segmental pulse volume recording. COMPARISON:  None. FINDINGS: Right ABI:  0.74 Left ABI:  0.60 Right Lower Extremity: Monophasic waveforms of the posterior tibial artery and dorsalis pedis. Left Lower Extremity: Monophasic waveforms of the posterior tibial artery and dorsalis pedis. IMPRESSION: Right: Resting ABI in the mild range of occlusive disease, although likely falsely elevated secondary to noncompressive vessels, given the monophasic waveforms at the ankles. Left: Resting ABI in the moderate range occlusive disease, likely falsely elevated secondary to noncompressible vessels given the monophasic waveforms at the ankles. Signed, 97. Yvone Neu, RPVI [facility] Electronically Signed   By: [facility] D.O.   On: 06/25/2020 15:45   DG Foot Complete Left  Result Date: 06/25/2020 CLINICAL DATA:  Diabetic patient with a skin ulceration on the left foot. History of prior amputation. EXAM: LEFT FOOT - COMPLETE 3+ VIEW COMPARISON:   Plain films left ankle 11/18/2016. FINDINGS: Again seen is postoperative change of forefoot amputation. There is a large skin ulceration projecting inferior to the cuboid. There is subtle loss of cortical bone along the inferior margin of the cuboid worrisome for osteomyelitis. Multiple small calcifications are seen at the amputation site. On the frontal view, 2 ossific fragments are seen at the wound. IMPRESSION: Large skin wound on the plantar surface of the foot with findings worrisome for osteomyelitis of the cuboid. Electronically Signed   By: 11/20/2016 M.D.   On: 06/25/2020 11:42    Labs:  CBC: Recent Labs    06/25/20 1201 06/26/20 0417  WBC 10.4 9.2  HGB 12.8 11.5*  HCT 40.3 36.5  PLT 377 350    COAGS: No results for input(s): INR, APTT in the last 8760 hours.  BMP: Recent Labs    06/25/20 1201 06/26/20 0417  NA 136 134*  K 4.5 3.6  CL 95* 98  CO2 31 27  GLUCOSE 69* 114*  BUN 27* 25*  CALCIUM  9.4 8.8*  CREATININE 0.88 0.74  GFRNONAA >60 >60    LIVER FUNCTION TESTS: Recent Labs    06/26/20 0417  BILITOT 0.5  AST 13*  ALT 14  ALKPHOS 90  PROT 7.3  ALBUMIN 2.5*    TUMOR MARKERS: No results for input(s): AFPTM, CEA, CA199, CHROMGRNA in the last 8760 hours.  Assessment and Plan:  Known PVD; +smoker Partial left foot amputation 2018 secondary osteomyelitis New left foot ulcer and drainage Failed antibiotic course Abnormal ABI and imaging suggesting osteomyelitis Scheduled for lower extremity arteriogram in IR 1/7  Risks and benefits of Bilateral lower extremity arteriogram with possible Left lower extremity intervention were discussed with the patient including, but not limited to bleeding, infection, vascular injury or contrast induced renal failure.  This interventional procedure involves the use of X-rays and because of the nature of the planned procedure, it is possible that we will have prolonged use of X-ray fluoroscopy.  Potential  radiation risks to you include (but are not limited to) the following: - A slightly elevated risk for cancer  several years later in life. This risk is typically less than 0.5% percent. This risk is low in comparison to the normal incidence of human cancer, which is 33% for women and 50% for men according to the [employer]. - Radiation induced injury can include skin redness, resembling a rash, tissue breakdown / ulcers and hair loss (which can be temporary or permanent).   The likelihood of either of these occurring depends on the difficulty of the procedure and whether you are sensitive to radiation due to previous procedures, disease, or genetic conditions.   IF your procedure requires a prolonged use of radiation, you will be notified and given written instructions for further action.  It is your responsibility to monitor the irradiated area for the 2 weeks following the procedure and to notify your physician if you are concerned that you have suffered a radiation induced injury.    All of the patient's questions were answered, patient is agreeable to proceed.  Consent signed and in chart.  Thank you for this interesting consult.  I greatly enjoyed meeting BINTOU LAFATA and look forward to participating in their care.  A copy of this report was sent to the requesting provider on this date.  Electronically Signed: Lavonia Drafts, PA-C 06/27/2020, 12:53 PM   I spent a total of 40 Minutes    in face to face in clinical consultation, greater than 50% of which was counseling/coordinating care for lower extremity arteriogram with poss intervention

## 2020-06-27 NOTE — Progress Notes (Signed)
[facility]   Patient agrees to angiogram tomorrow. Some minor bedside debridement tolerated. Will order santyl and BID saline dampened gauze dressing changes now.   She wants to save her foot she reports. Will see what angiogram shows.   Predebridement   Post Debridement    Minor bleeding, gauze applied and kerlix wrap.   Algis Greenhouse, MD [facility] [address] [place], [place] [address] [phone] (office)

## 2020-06-27 NOTE — Progress Notes (Signed)
PROGRESS NOTE  Tara Flores MLY:[id number] DOB: 1935/08/15 DOA: 06/25/2020 PCP: Hilbert Corrigan, MD  HPI/Recap of past 100 hours: 85 year old female with past medical history of PVD, atrial fibrillation with RVR, COPD, chronic diastolic heart failure, diabetes mellitus, ongoing tobacco abuse and status post partial left foot transmetatarsal amputation on 4/18 who is a long-term care resident at [facility] who presented to the emergency room on 1/4 with redness and swelling in her left foot going up her leg.  In the emergency room, x-ray noted findings of osteomyelitis to the left cuboid.  No evidence of sepsis/sepsis ruled out and patient started on Rocephin and vancomycin and general surgery consulted for evaluation of amputation.  Seen by general surgery who will do an amputation, but if it's possible to salvage foot, recommended vasc surgery input, who advised starting with angiogram.  (Angiogram set for Friday at [facility] by IR.)    Since admission, patient has been wavering as far as what she wants to have done.  She wants as much of her leg salvage is possible but at the same time, is not sure if she wants to even go through surgery.  Palliative care was consulted and patient has been considering her options.  Initially was unsure if she wanted to proceed with angiogram and after a discussion, at this time, she would like to proceed with angiogram to give her more information about whether or not she wants to proceed with surgery.  Assessment/Plan: Principal Problem:   Foot osteomyelitis, left [facility]): Previous history of transmetatarsal amputation and with ongoing PVD plus ongoing tobacco use makes it strong possibility that she may not be able to salvage the foot.  Angiogram planned for Friday.  At this time, patient is amenable to having this done. Active Problems:   Essential hypertension: Blood pressure stable.    Hypothyroidism: Continue Synthroid.    Anemia due to chronic  kidney disease: Hemoglobin stable right now.  She has stage I-2 chronic kidney disease.  Recheck labs in the morning.    Atrial fibrillation with RVR (HCC)   Tobacco use disorder: Ongoing.  Counseled.  Uncontrolled diabetes mellitus with peripheral vascular disease issues: A1c at 9.6.    Chronic diastolic CHF (congestive heart failure) ([place]): Currently euvolemic.  Stable.  Continue home p.o. Lasix    COPD (chronic obstructive pulmonary disease) ([place]): Stable.  At this time.    Overweight (BMI 25.0-29.9):  Meets criteria BMI greater than 25   Code Status: DNR  Family Communication: Left message for patient's daughter Mireyah  Disposition Plan: Depending on findings of angiogram if patient wants to proceed with surgery.   Consultants:  General Surgery  Discussion with Vascular Surgery  IR  Pall Care   Procedures:  Planned angiogram 1/7  Antimicrobials:  Iv Rocephin & Vanco 1/4-present   DVT prophylaxis:  Lovenox   Objective: Vitals:   06/26/20 2140 06/27/20 0539  BP:  129/63  Pulse:  61  Resp:  20  Temp:  98.3 F (36.8 C)  SpO2: 92% 92%    Intake/Output Summary (Last 24 hours) at 06/27/2020 1307 Last data filed at 06/27/2020 1024 Gross per 24 hour  Intake 100.23 ml  Output 1000 ml  Net -899.77 ml   Filed Weights   06/25/20 1056 06/26/20 0500 06/27/20 0539  Weight: 68 kg 68 kg 73.6 kg   Body mass index is 29.68 kg/m.  Exam:   General: Alert and oriented x3, mild distress secondary to issues with her foot  HEENT: Normocephalic and atraumatic, mucous membranes are moist  Cardiovascular: Irregular rhythm, rate controlled  Respiratory: Clear to auscultation bilaterally  Abdomen: Soft, nontender, nondistended, positive bowel sounds  Musculoskeletal: Status post previous transmetatarsal amputation on left foot.  No clubbing or cyanosis or edema  Skin: Remaining foot is erythematous tracings up left leg  Psychiatry: Appropriate, no evidence of  psychoses   Data Reviewed: CBC: Recent Labs  Lab 06/25/20 1201 06/26/20 0417  WBC 10.4 9.2  NEUTROABS 8.4*  --   HGB 12.8 11.5*  HCT 40.3 36.5  MCV 97.8 97.6  PLT 377 350   Basic Metabolic Panel: Recent Labs  Lab 06/25/20 1201 06/26/20 0417  NA 136 134*  K 4.5 3.6  CL 95* 98  CO2 31 27  GLUCOSE 69* 114*  BUN 27* 25*  CREATININE 0.88 0.74  CALCIUM 9.4 8.8*  MG  --  2.2   GFR: Estimated Creatinine Clearance: 50.9 mL/min (by C-G formula based on SCr of 0.74 mg/dL). Liver Function Tests: Recent Labs  Lab 06/26/20 0417  AST 13*  ALT 14  ALKPHOS 90  BILITOT 0.5  PROT 7.3  ALBUMIN 2.5*   No results for input(s): LIPASE, AMYLASE in the last 168 hours. No results for input(s): AMMONIA in the last 168 hours. Coagulation Profile: No results for input(s): INR, PROTIME in the last 168 hours. Cardiac Enzymes: No results for input(s): CKTOTAL, CKMB, CKMBINDEX, TROPONINI in the last 168 hours. BNP (last 3 results) No results for input(s): PROBNP in the last 8760 hours. HbA1C: Recent Labs    06/25/20 1550  HGBA1C 9.6*   CBG: Recent Labs  Lab 06/26/20 1149 06/26/20 1614 06/26/20 2018 06/27/20 0725 06/27/20 1124  GLUCAP 195* 177* 355* 212* 301*   Lipid Profile: No results for input(s): CHOL, HDL, LDLCALC, TRIG, CHOLHDL, LDLDIRECT in the last 72 hours. Thyroid Function Tests: No results for input(s): TSH, T4TOTAL, FREET4, T3FREE, THYROIDAB in the last 72 hours. Anemia Panel: No results for input(s): VITAMINB12, FOLATE, FERRITIN, TIBC, IRON, RETICCTPCT in the last 72 hours. Urine analysis:    Component Value Date/Time   COLORURINE YELLOW 02/09/2017 2030   APPEARANCEUR CLEAR 02/09/2017 2030   LABSPEC 1.009 02/09/2017 2030   PHURINE 7.0 02/09/2017 2030   GLUCOSEU NEGATIVE 02/09/2017 2030   HGBUR NEGATIVE 02/09/2017 2030   BILIRUBINUR NEGATIVE 02/09/2017 2030   KETONESUR NEGATIVE 02/09/2017 2030   PROTEINUR 30 (A) 02/09/2017 2030   UROBILINOGEN 0.2  02/05/2015 0528   NITRITE NEGATIVE 02/09/2017 2030   LEUKOCYTESUR MODERATE (A) 02/09/2017 2030   Sepsis Labs: @LABRCNTIP (procalcitonin:4,lacticidven:4)  ) Recent Results (from the past 240 hour(s))  Wound or Superficial Culture     Status: None   Collection Time: 06/25/20 11:35 AM   Specimen: Foot; Wound  Result Value Ref Range Status   Specimen Description   Final    FOOT LEFT Performed at [facility], [address], [place], [place]    Special Requests   Final    NONE Performed at [facility], [address] [place], [place]    Gram Stain   Final    NO WBC SEEN ABUNDANT GRAM POSITIVE COCCI FEW GRAM NEGATIVE RODS    Culture   Final    FEW GROUP B STREP(S.AGALACTIAE)ISOLATED TESTING AGAINST S. AGALACTIAE NOT ROUTINELY PERFORMED DUE TO PREDICTABILITY OF AMP/PEN/VAN SUSCEPTIBILITY. NO STAPHYLOCOCCUS AUREUS ISOLATED WITHIN MIXED CULTURE Performed at [facility], [address] [place], [place]    Report Status 06/27/2020 FINAL  Final  Culture, blood (routine x 2)     Status: None (Preliminary result)   Collection Time: 06/25/20 12:01 PM   Specimen: BLOOD  Result Value Ref Range Status   Specimen Description BLOOD BLOOD LEFT HAND  Final   Special Requests   Final    BOTTLES DRAWN AEROBIC AND ANAEROBIC Blood Culture results may not be optimal due to an inadequate volume of blood received in culture bottles   Culture   Final    NO GROWTH 2 DAYS Performed at [facility], [address], [place], [place] [address]    Report Status PENDING  Incomplete  Culture, blood (routine x 2)     Status: None (Preliminary result)   Collection Time: 06/25/20 12:01 PM   Specimen: BLOOD  Result Value Ref Range Status   Specimen Description BLOOD LEFT ANTECUBITAL  Final   Special Requests   Final    BOTTLES DRAWN AEROBIC AND ANAEROBIC Blood Culture adequate volume   Culture   Final    NO GROWTH 2 DAYS Performed at [facility], [address] [place], [place] [address]    Report Status PENDING  Incomplete  SARS CORONAVIRUS 2 (TAT 6-24 HRS) Nasopharyngeal Nasopharyngeal Swab     Status: None   Collection Time: 06/25/20  2:00 PM   Specimen: Nasopharyngeal Swab  Result Value Ref Range Status   SARS Coronavirus 2 NEGATIVE NEGATIVE Final    Comment: (NOTE) SARS-CoV-2 target nucleic acids are NOT DETECTED.  The SARS-CoV-2 RNA is generally detectable in upper and lower respiratory specimens during the acute phase of infection. Negative results do not preclude SARS-CoV-2 infection, do not rule out co-infections with other pathogens, and should not be used as the sole basis for treatment or other patient management decisions. Negative results must be combined with clinical observations, patient history, and epidemiological information. The expected result is Negative.  Fact Sheet for Patients: HairSlick.no  Fact Sheet for Healthcare Providers: quierodirigir.com  This test is not yet approved or cleared by the [place] FDA and  has been authorized for detection and/or diagnosis of SARS-CoV-2 by FDA under an Emergency Use Authorization (EUA). This EUA will remain  in effect (meaning this test can be used) for the duration of the COVID-19 declaration under Se ction 564(b)(1) of the Act, 21 U.S.C. section 360bbb-3(b)(1), unless the authorization is terminated or revoked sooner.  Performed at [facility], [address], [place], [place] [address]   MRSA PCR Screening     Status: Abnormal   Collection Time: 06/26/20  1:32 AM   Specimen: Nasal Mucosa; Nasopharyngeal  Result Value Ref Range Status   MRSA by PCR POSITIVE (A) NEGATIVE Final    Comment:        The GeneXpert MRSA Assay (FDA approved for NASAL specimens only), is one component of a comprehensive MRSA colonization surveillance program. It is not intended to diagnose MRSA infection nor to guide  or monitor treatment for MRSA infections. RESULT CALLED TO, READ BACK BY AND VERIFIED WITH: PEACH,RN@0520  06/26/20 [facility] Performed at [facility], [address], [place], [place] [address]       Studies: No results found.  Scheduled Meds: . (feeding supplement) PROSource Plus  30 mL Oral Daily  . bisoprolol  5 mg Oral Daily  . calcium-vitamin D  1 tablet Oral Daily  . Chlorhexidine Gluconate Cloth  6 each Topical Q0600  . diltiazem  120 mg Oral Daily  . enoxaparin (LOVENOX) injection  70 mg Subcutaneous Q12H  .  furosemide  60 mg Oral BID  . insulin aspart  0-5 Units Subcutaneous QHS  . insulin aspart  0-9 Units Subcutaneous TID WC  . insulin NPH Human  10 Units Subcutaneous BID AC & HS  . isosorbide mononitrate  15 mg Oral Daily  . levothyroxine  137 mcg Oral Daily  . multivitamin with minerals  1 tablet Oral Daily  . mupirocin ointment  1 application Nasal BID  . nicotine  7 mg Transdermal Daily  . pravastatin  20 mg Oral Daily  . sodium chloride flush  3 mL Intravenous Q12H  . spironolactone  12.5 mg Oral BID  . zinc sulfate  220 mg Oral Daily    Continuous Infusions: . sodium chloride    . cefTRIAXone (ROCEPHIN)  IV 2 g (06/26/20 1604)  . vancomycin 750 mg (06/26/20 1755)     LOS: 2 days     Hollice Espy, MD [facility]   06/27/2020, 1:07 PM

## 2020-06-27 NOTE — Progress Notes (Signed)
Palliative: Tara Flores, is sitting up in the [place] chair in her room.  She greets me making but not keeping contact.  She appears acutely/chronically ill and frail.  She is alert and oriented, able to make her basic needs known.  There is no family at bedside at this time.  She continues to have a flat affect.  Artie and I talk about the treatment plan and her options in detail.  We talked about planned angiogram for 1/7 with a goal of blood flow evaluation and revascularization options.  I again today encouraged Tara Flores to consider whether she would except an amputation if revascularization efforts are inadequate.  She shares that her ultimate goal is to save her foot, and she is undecided whether she will accept amputation.  She asks, "what if I have it done and diet in a year?".  I share that if she does not have amputation, infection is expected to spread throughout her body.  I shared that she may only have months left at that point.  She seems very thoughtful about this.  I reassured Tara Flores that we are going to respect her choice, and care for her.  Conference with attending, bedside nursing staff, transition of care team related to patient condition, needs, goals of care.  Plan: Arteriogram 1/7 for blood flow and revascularization options.  Tara Flores still undecided as to whether she would accept surgery, instead focusing on, "I want to save my foot".  PMT to follow-up Monday, 1/10  35 minutes  Seth Bake, NP Palliative medicine team Team phone [phone] Greater than 50% of this time was spent counseling and coordinating care related to the above assessment and plan.

## 2020-06-28 ENCOUNTER — Ambulatory Visit (HOSPITAL_COMMUNITY)
Admission: RE | Admit: 2020-06-28 | Discharge: 2020-06-28 | Disposition: A | Discharge status: Home / Self Care | Payer: Medicare Other | Source: Ambulatory Visit | Attending: Internal Medicine | Admitting: Internal Medicine

## 2020-06-28 ENCOUNTER — Ambulatory Visit (HOSPITAL_COMMUNITY): Payer: Medicare Other

## 2020-06-28 ENCOUNTER — Inpatient Hospital Stay (HOSPITAL_COMMUNITY): Payer: Medicare Other

## 2020-06-28 DIAGNOSIS — L97429 Non-pressure chronic ulcer of left heel and midfoot with unspecified severity: Secondary | ICD-10-CM | POA: Insufficient documentation

## 2020-06-28 DIAGNOSIS — I5032 Chronic diastolic (congestive) heart failure: Secondary | ICD-10-CM | POA: Diagnosis not present

## 2020-06-28 DIAGNOSIS — I70262 Atherosclerosis of native arteries of extremities with gangrene, left leg: Secondary | ICD-10-CM | POA: Insufficient documentation

## 2020-06-28 DIAGNOSIS — I4891 Unspecified atrial fibrillation: Secondary | ICD-10-CM | POA: Diagnosis not present

## 2020-06-28 DIAGNOSIS — E11621 Type 2 diabetes mellitus with foot ulcer: Secondary | ICD-10-CM | POA: Insufficient documentation

## 2020-06-28 DIAGNOSIS — E1152 Type 2 diabetes mellitus with diabetic peripheral angiopathy with gangrene: Secondary | ICD-10-CM | POA: Insufficient documentation

## 2020-06-28 DIAGNOSIS — E663 Overweight: Secondary | ICD-10-CM | POA: Diagnosis not present

## 2020-06-28 DIAGNOSIS — M868X7 Other osteomyelitis, ankle and foot: Secondary | ICD-10-CM | POA: Diagnosis not present

## 2020-06-28 LAB — CBC
HCT: 37.7 % (ref 36.0–46.0)
Hemoglobin: 11.9 g/dL — ABNORMAL LOW (ref 12.0–15.0)
MCH: 31 pg (ref 26.0–34.0)
MCHC: 31.6 g/dL (ref 30.0–36.0)
MCV: 98.2 fL (ref 80.0–100.0)
Platelets: 334 10*3/uL (ref 150–400)
RBC: 3.84 MIL/uL — ABNORMAL LOW (ref 3.87–5.11)
RDW: 13.2 % (ref 11.5–15.5)
WBC: 7.1 10*3/uL (ref 4.0–10.5)
nRBC: 0 % (ref 0.0–0.2)

## 2020-06-28 LAB — GLUCOSE, CAPILLARY
Glucose-Capillary: 295 mg/dL — ABNORMAL HIGH (ref 70–99)
Glucose-Capillary: 281 mg/dL — ABNORMAL HIGH (ref 70–99)
Glucose-Capillary: 245 mg/dL — ABNORMAL HIGH (ref 70–99)
Glucose-Capillary: 285 mg/dL — ABNORMAL HIGH (ref 70–99)

## 2020-06-28 LAB — BASIC METABOLIC PANEL
Anion gap: 9 (ref 5–15)
BUN: 18 mg/dL (ref 8–23)
CO2: 29 mmol/L (ref 22–32)
Calcium: 8.9 mg/dL (ref 8.9–10.3)
Chloride: 96 mmol/L — ABNORMAL LOW (ref 98–111)
Creatinine, Ser: 0.88 mg/dL (ref 0.44–1.00)
GFR, Estimated: >60 mL/min (ref >60)
Glucose, Bld: 345 mg/dL — ABNORMAL HIGH (ref 70–99)
Potassium: 3.8 mmol/L (ref 3.5–5.1)
Sodium: 134 mmol/L — ABNORMAL LOW (ref 135–145)

## 2020-06-28 LAB — PROTIME-INR
INR: 0.9 (ref 0.8–1.2)
Prothrombin Time: 12.2 seconds (ref 11.4–15.2)

## 2020-06-28 MED ORDER — MIDAZOLAM HCL 2 MG/2ML IJ SOLN
INTRAMUSCULAR | Status: AC
  Filled 2020-06-28: qty 4

## 2020-06-28 MED ORDER — ASPIRIN 325 MG PO TABS
ORAL_TABLET | ORAL | Status: AC
  Administered 2020-06-28: 650 mg via ORAL
  Filled 2020-06-28: qty 2

## 2020-06-28 MED ORDER — FENTANYL CITRATE (PF) 100 MCG/2ML IJ SOLN
INTRAMUSCULAR | Status: AC
  Filled 2020-06-28: qty 4

## 2020-06-28 MED ORDER — IOHEXOL 300 MG/ML  SOLN
100.0000 mL | Freq: Once | INTRAMUSCULAR | Status: AC | PRN
  Administered 2020-06-28: 50 mL via INTRA_ARTERIAL

## 2020-06-28 MED ORDER — CLOPIDOGREL BISULFATE 75 MG PO TABS: 300.0000 mg | ORAL_TABLET | Freq: Once | ORAL | Status: AC

## 2020-06-28 MED ORDER — ONDANSETRON HCL 4 MG/2ML IJ SOLN
INTRAMUSCULAR | Status: AC | PRN
  Administered 2020-06-28 (×2): 4 mg via INTRAVENOUS

## 2020-06-28 MED ORDER — HEPARIN SODIUM (PORCINE) 1000 UNIT/ML IJ SOLN
INTRAMUSCULAR | Status: AC
  Filled 2020-06-28: qty 1

## 2020-06-28 MED ORDER — HEPARIN SODIUM (PORCINE) 1000 UNIT/ML IJ SOLN
INTRAMUSCULAR | Status: AC | PRN
  Administered 2020-06-28: 8000 [IU] via INTRAVENOUS

## 2020-06-28 MED ORDER — IOHEXOL 300 MG/ML  SOLN
100.0000 mL | Freq: Once | INTRAMUSCULAR | Status: AC | PRN
  Administered 2020-06-28: 75 mL via INTRA_ARTERIAL

## 2020-06-28 MED ORDER — CLOPIDOGREL BISULFATE 300 MG PO TABS
ORAL_TABLET | ORAL | Status: AC
  Administered 2020-06-28: 300 mg via ORAL
  Filled 2020-06-28: qty 1

## 2020-06-28 MED ORDER — MIDAZOLAM HCL 2 MG/2ML IJ SOLN
INTRAMUSCULAR | Status: AC | PRN
  Administered 2020-06-28 (×4): 1 mg via INTRAVENOUS

## 2020-06-28 MED ORDER — FENTANYL CITRATE (PF) 100 MCG/2ML IJ SOLN
INTRAMUSCULAR | Status: AC | PRN
  Administered 2020-06-28: 25 ug via INTRAVENOUS
  Administered 2020-06-28: 50 ug via INTRAVENOUS
  Administered 2020-06-28: 25 ug via INTRAVENOUS
  Administered 2020-06-28 (×2): 50 ug via INTRAVENOUS

## 2020-06-28 MED ORDER — CHLORHEXIDINE GLUCONATE 4 % EX LIQD
CUTANEOUS | Status: AC
  Filled 2020-06-28: qty 15

## 2020-06-28 MED ORDER — ASPIRIN 325 MG PO TABS: 650.0000 mg | ORAL_TABLET | Freq: Once | ORAL | Status: AC

## 2020-06-28 MED ORDER — ONDANSETRON HCL 4 MG/2ML IJ SOLN
INTRAMUSCULAR | Status: AC
  Filled 2020-06-28: qty 2

## 2020-06-28 MED ORDER — LIDOCAINE HCL (PF) 1 % IJ SOLN
INTRAMUSCULAR | Status: AC | PRN
  Administered 2020-06-28: 10 mL

## 2020-06-28 MED ORDER — LIDOCAINE HCL 1 % IJ SOLN
INTRAMUSCULAR | Status: AC
  Filled 2020-06-28: qty 20

## 2020-06-28 MED ORDER — IOHEXOL 300 MG/ML  SOLN
100.0000 mL | Freq: Once | INTRAMUSCULAR | Status: AC | PRN
  Administered 2020-06-28: 16 mL via INTRA_ARTERIAL

## 2020-06-28 NOTE — Procedures (Addendum)
Interventional Radiology Procedure Note  Procedure:   US guided access right CFA and left DP. Angiogram LLE Treatment of left SFA stenosis and left peroneal artery occlusion, restoring inline flow into the dorsalis pedis.  DEB of the left SFA, 0% residual stenosis PTA of the left peroneal artery restoring flow, 100% improved flow  Closure failed.  Manual pressure for hemostasis  Complications: None  Recommendations:  - Continue dual anti-platelet daily, po ASA 81mg , po Plavix 75mg  - frequent NV checks, including right groin checks - right hip straight overnight, 12 hours, with pressure dressing overnight - ok to advance diet - no metformin x 48 hours - Do not submerge for 7 days - Routine wound care  - follow up with Dr. in clinic in 6 weeks, no imaging  Signed,  . Loreta Ave, DO

## 2020-06-28 NOTE — Sedation Documentation (Signed)
Closure device failed. Manual pressure held. Hemostasis achieved at 1730

## 2020-06-28 NOTE — Sedation Documentation (Signed)
[facility] transport arrived to transport patient to [facility] ICU room 6.  Care turned over to Mellody Dance and [employer] at this time for transport.

## 2020-06-28 NOTE — Progress Notes (Signed)
[facility] transport arrived to transport patient to [facility] ICU room 6.  Care turned over to Keith and Tim EMT/Paramedic at this time for transport.  

## 2020-06-28 NOTE — Progress Notes (Signed)
[facility] unable to accept patient post procedure requiring arterial access and monitoring of groin site and pulses.  Patient transferred to Radiology Nurses Station for monitoring while awaiting bed placement.  

## 2020-06-28 NOTE — Sedation Documentation (Signed)
Patient new bed assignment received, [facility] ICU 6. Report called at this time to receiving RN Morrie Sheldon. Notified Morrie Sheldon that only patient belonging that is here at [facility] with patient is her red cell phone.

## 2020-06-28 NOTE — Progress Notes (Signed)
Patient new bed assignment received, [facility] ICU 6. Report called at this time to receiving RN Ashley. Notified Ashley that only patient belonging that is here at [place] with patient is her red cell phone.   

## 2020-06-28 NOTE — Sedation Documentation (Addendum)
[facility] unable to accept patient post procedure requiring arterial access and monitoring of groin site and pulses.  Patient transferred to Radiology Nurses Station for monitoring while awaiting bed placement.

## 2020-06-28 NOTE — Progress Notes (Signed)
[facility]  Will reassess wound on Monday. Continue Santyl with dressing changes. Angiogram today.  Discussed with Dr. Rito Ehrlich.   Algis Greenhouse, MD [facility] [address] [place], [place] [address] [phone] (office)

## 2020-06-28 NOTE — Progress Notes (Signed)
Inpatient Diabetes Program Recommendations  AACE/ADA: New Consensus Statement on Inpatient Glycemic Control  Target Ranges:  Prepandial:   less than 140 mg/dL      Peak postprandial:   less than 180 mg/dL (1-2 hours)      Critically ill patients:  140 - 180 mg/dL   Results for Tara Flores, Tara Flores (MRN [id number]) as of 06/28/2020 09:13  Ref. Range 06/27/2020 07:25 06/27/2020 11:24 06/27/2020 16:46 06/27/2020 21:06 06/28/2020 07:30  Glucose-Capillary Latest Ref Range: 70 - 99 mg/dL 299 (H) 371 (H) 696 (H) 122 (H) 295 (H)   Review of Glycemic Control  Diabetes history: DM2 Outpatient Diabetes medications: NPH 40 units QAM, NPH 20 units QPM, Humalog 5-10 units TID with meals, Jardiance 10 mg daily, Byetta 10 mcg BID Current orders for Inpatient glycemic control: NPH 14 units BID, Novolog 0-9 units TID with meals, Novolog 0-5 units QHS  Inpatient Diabetes Program Recommendations:    Insulin: Please consider ordering Novolog 5 units TID with meals for meal coverage if patient eats at least 50% of meals.  Thanks, Orlando Penner, RN, MSN, CDE Diabetes Coordinator Inpatient Diabetes Program [phone] (Team Pager from 8am to 5pm)

## 2020-06-28 NOTE — Progress Notes (Signed)
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      Non-invasive lower extremity exam and imaging work-up shows evidence of severe PAD, including fem-pop and tibial disease.   I had a discussion with Tara Flores today regarding   natural history of this disease. The indications for treatment supported by updated guidelines1, 2 were discussed.  Physical exam:  Wound on the plantar left heel.  Non-palpable pulses.  Left PT is no doppler at this time Left AT is present Right AT and PT are doppler +  I did say that if no revascularization is possible, she is looking at primary amputation as treatment.  She understands, though is not certain she would allow the amputation.   Patient has elected to proceed with endovascular options.   Regarding endovascular options, specific risks discussed include: bleeding, infection, contrast reaction, renal injury/nephropathy, arterial injury/dissection, need for additional procedure/surgery, worsening symptoms/tissue including limb loss, cardiopulmonary collapse, death.    Smoking cessation was addressed, with strategies including counseling and nicotine replacement.  She says, "smoking is the only pleasure I get, and I am not quitting."    Plan:  - load with anti-platelet. - Plan is to proceed with aorto-peripheral angiogram and possible intervention.  ___________________________________________________________________   1Monte Fantasia MD, et al. 2016 AHA/ACC Guideline on the Management of Patients With Lower Extremity Peripheral Artery Disease: Executive Summary: A Report of the American College of Cardiology/American Heart Association Task Force on Clinical Practice Guidelines. J Am Coll Cardiol. 2017 Mar 21;69(11):1465-1508. doi: 10.1016/j.jacc.2016.11.008.   2 - Norgren L, et al. TASC II Working Group. Inter-society consensus for the management of peripheral  arterial disease. Int Nunzio Cobbs. 2007 Jun;26(2):81-157. Review. PubMed PMID: 37106269  3 - Hingorani A, et al. The management of diabetic foot: A clinical practice guideline by the Society for Vascular Surgery in collaboration with the American Podiatric Medical Association and the Society  for Vascular Medicine. J Vasc Surg. 2016 Feb;63(2 Suppl):3S-21S. doi: 10.1016/j.jvs.2015.10.003. PubMed PMID: 48546270.  4 - Luther Hearing, Saab FA, Elyse Jarvis, Danae Orleans, Deeann Cree, Driver VR, [place], Lookstein R, van den Tilman Neat, Jaff MR, Reinaldo Raddle, Henao S, AlMahameed A, Katzen B. Digital Subtraction Angiography Prior to an Amputation for Critical Limb Ischemia (CLI): An Expert Recommendation Statement From the CLI Global Society to Optimize Limb Salvage. J Endovasc Ther. 2020 Aug;27(4):540-546. doi: 10.1177/1526602820928590. Epub 2020 May 29. PMID: 35009381.

## 2020-06-28 NOTE — Progress Notes (Signed)
Patient transported to [facility] for procedure but unable to return to unit 300. Patient to be transported back to [facility] to ICU. Report called and given to Shon Baton, RN. Patient took cell phone with her to procedure, other belongings to be taken to ICU.

## 2020-06-28 NOTE — Care Management Important Message (Signed)
Important Message  Patient Details  Name: Tara Flores MRN: [id number] Date of Birth: [birth year]   Medicare Important Message Given:  Yes     Corey Harold 06/28/2020, 12:51 PM

## 2020-06-28 NOTE — Progress Notes (Signed)
PROGRESS NOTE  TEREA NEUBAUER QQI:[id number] DOB: 10-10-1935 DOA: 06/25/2020 PCP: Bernerd Limbo, MD  HPI/Recap of past 12 hours: 85 year old female with past medical history of PVD, atrial fibrillation with RVR, COPD, chronic diastolic heart failure, diabetes mellitus, ongoing tobacco abuse and status post partial left foot transmetatarsal amputation on 4/18 who is a long-term care resident at [facility] who presented to the emergency room on 1/4 with redness and swelling in her left foot going up her leg.  In the emergency room, x-ray noted findings of osteomyelitis to the left cuboid.  No evidence of sepsis/sepsis ruled out and patient started on Rocephin and vancomycin and general surgery consulted for evaluation of amputation.  Seen by general surgery who will do an amputation, but if it's possible to salvage foot, needed vascular input.    Since admission, patient has been wavering as far as what she wants to have done.  She wants as much of her leg salvage is possible but at the same time, is not sure if she wants to even go through surgery.  Palliative care was consulted and patient has been considering her options.  After some consideration, she is amenable to getting the angiogram that we will give her full information about whether or not she would consider surgery/possible amputation.  Seen by interventional radiology and patient underwent initial angiogram of aorta with bifemoral which noted some high-grade stenosis of the left hypogastric region and advanced bilateral femoral-popliteal disease with multifocal high-grade SFA stenoses bilaterally & other areas of diffuse disease on 1/6.    Today, she is doing okay.  She underwent bedside debridement this morning by general surgery of the stump.  Patient otherwise tired, but feels okay.  She is scheduled to go for aortogram and possible endovascular intervention if possible today by interventional  radiology.  Assessment/Plan: Principal Problem:   Foot osteomyelitis, left [facility]): Previous history of transmetatarsal amputation and with ongoing PVD plus ongoing tobacco use makes it strong possibility that she may not be able to salvage the foot.  S/p CT angio of the aorta with bifemoral runoff.  Angiogram of Left lower ext with possible intervention for later today.  If intervention is not possible, patient will have to face amputation.  She is undecided on amputation and may end up choosing palliative care.   Active Problems:   Essential hypertension: Blood pressure stable.    Hypothyroidism: Continue Synthroid.    Anemia due to chronic kidney disease: Hemoglobin stable right now.  She has stage I-2 chronic kidney disease.      Atrial fibrillation with RVR (HCC)   Tobacco use disorder: Ongoing.  Counseled.  Uncontrolled diabetes mellitus with peripheral vascular disease issues: A1c at 9.6.    Chronic diastolic CHF (congestive heart failure) (HCC): Currently euvolemic.  Stable.  Continue home p.o. Lasix & she has diuresed over 4 liters.    COPD (chronic obstructive pulmonary disease) (HCC): Stable.  At this time.    Overweight (BMI 25.0-29.9):  Meets criteria BMI greater than 25   Code Status: DNR  Family Communication: Left message for patient's daughter Candia  Disposition Plan: Depending on findings of angiogram if patient wants to proceed with surgery.   Consultants:  General Surgery  Discussion with Vascular Surgery  IR  Pall Care   Procedures:  Planned angiogram 1/7  Angiogram 1/6 of aorta w/ bifemoral runoff  Debridement of stump on 1/7  Angiogram with endovascular intervention planned 1/7  Antimicrobials:  Iv Rocephin & Vanco 1/4-present  DVT prophylaxis:  Lovenox   Objective: Vitals:   06/27/20 2142 06/28/20 0515  BP:  (!) 124/59  Pulse:  67  Resp:  18  Temp:  98.7 F (37.1 C)  SpO2: 93% 90%    Intake/Output Summary (Last 24 hours) at  06/28/2020 1259 Last data filed at 06/28/2020 1100 Gross per 24 hour  Intake 0 ml  Output 3100 ml  Net -3100 ml   Filed Weights   06/26/20 0500 06/27/20 0539 06/28/20 0515  Weight: 68 kg 73.6 kg 74.4 kg   Body mass index is 30 kg/m.  Exam:   General: Alert and oriented x3, mild distress secondary to issues with her foot  HEENT: Normocephalic and atraumatic, mucous membranes are moist  Cardiovascular: Irregular rhythm, rate controlled  Respiratory: Clear to auscultation bilaterally  Abdomen: Soft, nontender, nondistended, positive bowel sounds  Musculoskeletal: Status post previous transmetatarsal amputation on left foot.  Foot is wrapped following debridement  Psychiatry: Appropriate, no evidence of psychoses   Data Reviewed: CBC: Recent Labs  Lab 06/25/20 1201 06/26/20 0417 06/28/20 0632  WBC 10.4 9.2 7.1  NEUTROABS 8.4*  --   --   HGB 12.8 11.5* 11.9*  HCT 40.3 36.5 37.7  MCV 97.8 97.6 98.2  PLT 377 350 334   Basic Metabolic Panel: Recent Labs  Lab 06/25/20 1201 06/26/20 0417 06/28/20 0632  NA 136 134* 134*  K 4.5 3.6 3.8  CL 95* 98 96*  CO2 31 27 29   GLUCOSE 69* 114* 345*  BUN 27* 25* 18  CREATININE 0.88 0.74 0.88  CALCIUM 9.4 8.8* 8.9  MG  --  2.2  --    GFR: Estimated Creatinine Clearance: 46.5 mL/min (by C-G formula based on SCr of 0.88 mg/dL). Liver Function Tests: Recent Labs  Lab 06/26/20 0417  AST 13*  ALT 14  ALKPHOS 90  BILITOT 0.5  PROT 7.3  ALBUMIN 2.5*   No results for input(s): LIPASE, AMYLASE in the last 168 hours. No results for input(s): AMMONIA in the last 168 hours. Coagulation Profile: Recent Labs  Lab 06/28/20 0632  INR 0.9   Cardiac Enzymes: No results for input(s): CKTOTAL, CKMB, CKMBINDEX, TROPONINI in the last 168 hours. BNP (last 3 results) No results for input(s): PROBNP in the last 8760 hours. HbA1C: Recent Labs    06/25/20 1550  HGBA1C 9.6*   CBG: Recent Labs  Lab 06/27/20 1124 06/27/20 1646  06/27/20 2106 06/28/20 0730 06/28/20 1108  GLUCAP 301* 251* 122* 295* 281*   Lipid Profile: No results for input(s): CHOL, HDL, LDLCALC, TRIG, CHOLHDL, LDLDIRECT in the last 72 hours. Thyroid Function Tests: No results for input(s): TSH, T4TOTAL, FREET4, T3FREE, THYROIDAB in the last 72 hours. Anemia Panel: No results for input(s): VITAMINB12, FOLATE, FERRITIN, TIBC, IRON, RETICCTPCT in the last 72 hours. Urine analysis:    Component Value Date/Time   COLORURINE YELLOW 02/09/2017 2030   APPEARANCEUR CLEAR 02/09/2017 2030   LABSPEC 1.009 02/09/2017 2030   PHURINE 7.0 02/09/2017 2030   GLUCOSEU NEGATIVE 02/09/2017 2030   HGBUR NEGATIVE 02/09/2017 2030   BILIRUBINUR NEGATIVE 02/09/2017 2030   KETONESUR NEGATIVE 02/09/2017 2030   PROTEINUR 30 (A) 02/09/2017 2030   UROBILINOGEN 0.2 02/05/2015 0528   NITRITE NEGATIVE 02/09/2017 2030   LEUKOCYTESUR MODERATE (A) 02/09/2017 2030   Sepsis Labs: @LABRCNTIP (procalcitonin:4,lacticidven:4)  ) Recent Results (from the past 240 hour(s))  Wound or Superficial Culture     Status: None   Collection Time: 06/25/20 11:35 AM   Specimen: Foot; Wound  Result Value Ref Range Status   Specimen Description   Final    FOOT LEFT Performed at [facility], [address] [place], [place] [address]    Special Requests   Final    NONE Performed at [facility], [address], [place], [place] [address]    Gram Stain   Final    NO WBC SEEN ABUNDANT GRAM POSITIVE COCCI FEW GRAM NEGATIVE RODS    Culture   Final    FEW GROUP B STREP(S.AGALACTIAE)ISOLATED TESTING AGAINST S. AGALACTIAE NOT ROUTINELY PERFORMED DUE TO PREDICTABILITY OF AMP/PEN/VAN SUSCEPTIBILITY. NO STAPHYLOCOCCUS AUREUS ISOLATED WITHIN MIXED CULTURE Performed at [facility], [address], [place], [place] [address]    Report Status 06/27/2020 FINAL  Final  Culture, blood (routine x 2)     Status: None (Preliminary result)   Collection Time: 06/25/20 12:01 PM   Specimen:  BLOOD  Result Value Ref Range Status   Specimen Description BLOOD BLOOD LEFT HAND  Final   Special Requests   Final    BOTTLES DRAWN AEROBIC AND ANAEROBIC Blood Culture results may not be optimal due to an inadequate volume of blood received in culture bottles   Culture   Final    NO GROWTH 3 DAYS Performed at [facility], [address] [place], [place] [address]    Report Status PENDING  Incomplete  Culture, blood (routine x 2)     Status: None (Preliminary result)   Collection Time: 06/25/20 12:01 PM   Specimen: BLOOD  Result Value Ref Range Status   Specimen Description BLOOD LEFT ANTECUBITAL  Final   Special Requests   Final    BOTTLES DRAWN AEROBIC AND ANAEROBIC Blood Culture adequate volume   Culture   Final    NO GROWTH 3 DAYS Performed at [facility], [address], [place], [place] [address]    Report Status PENDING  Incomplete  SARS CORONAVIRUS 2 (TAT 6-24 HRS) Nasopharyngeal Nasopharyngeal Swab     Status: None   Collection Time: 06/25/20  2:00 PM   Specimen: Nasopharyngeal Swab  Result Value Ref Range Status   SARS Coronavirus 2 NEGATIVE NEGATIVE Final    Comment: (NOTE) SARS-CoV-2 target nucleic acids are NOT DETECTED.  The SARS-CoV-2 RNA is generally detectable in upper and lower respiratory specimens during the acute phase of infection. Negative results do not preclude SARS-CoV-2 infection, do not rule out co-infections with other pathogens, and should not be used as the sole basis for treatment or other patient management decisions. Negative results must be combined with clinical observations, patient history, and epidemiological information. The expected result is Negative.  Fact Sheet for Patients: HairSlick.no  Fact Sheet for Healthcare Providers: quierodirigir.com  This test is not yet approved or cleared by the [place] FDA and  has been authorized for detection and/or diagnosis of  SARS-CoV-2 by FDA under an Emergency Use Authorization (EUA). This EUA will remain  in effect (meaning this test can be used) for the duration of the COVID-19 declaration under Se ction 564(b)(1) of the Act, 21 U.S.C. section 360bbb-3(b)(1), unless the authorization is terminated or revoked sooner.  Performed at [facility], [address] [place], [place] [address]   MRSA PCR Screening     Status: Abnormal   Collection Time: 06/26/20  1:32 AM   Specimen: Nasal Mucosa; Nasopharyngeal  Result Value Ref Range Status   MRSA by PCR POSITIVE (A) NEGATIVE Final    Comment:  The GeneXpert MRSA Assay (FDA approved for NASAL specimens only), is one component of a comprehensive MRSA colonization surveillance program. It is not intended to diagnose MRSA infection nor to guide or monitor treatment for MRSA infections. RESULT CALLED TO, READ BACK BY AND VERIFIED WITH: PEACH,RN@0520  06/26/20 [facility] Performed at [facility], [address], [place], [place] [address]       Studies: CT ANGIO AO+BIFEM W & OR WO CONTRAST  Result Date: 06/27/2020 CLINICAL DATA:  85 year old female with a history left foot infection EXAM: CT ANGIOGRAPHY OF ABDOMINAL AORTA WITH ILIOFEMORAL RUNOFF TECHNIQUE: Multidetector CT imaging of the abdomen, pelvis and lower extremities was performed using the standard protocol during bolus administration of intravenous contrast. Multiplanar CT image reconstructions and MIPs were obtained to evaluate the vascular anatomy. CONTRAST:  6mL OMNIPAQUE IOHEXOL 350 MG/ML SOLN COMPARISON:  Noninvasive 06/25/2020 FINDINGS: VASCULAR Aorta: Atherosclerotic changes of the lower thoracic aorta. Diameter of the aorta at the hiatus measures 2 cm. No dissection. No ulcerated plaque or pedunculated plaque. No periaortic inflammatory changes or fluid. Circumferential atherosclerotic calcifications of the abdominal aorta. Celiac: Celiac artery is patent, with configuration at the  origin compatible with overlying compression of the cruise. SMA: SMA patent with mild atherosclerosis at the origin. Renals: - Right: Single right renal artery with mild to moderate atherosclerotic changes at the origin. No evidence of high-grade stenosis. - Left: Single left renal artery with mild atherosclerosis at the origin. IMA: IMA is patent. Right lower extremity: Moderate atherosclerotic changes of the right iliac system. No dissection or aneurysm of the common iliac artery. No high-grade stenosis or occlusion. Hypogastric artery is patent. External iliac artery is patent with mild atherosclerosis. Common femoral artery with mild atherosclerotic changes with calcified plaque on the posterior wall. Profunda femoris and the thigh branches are patent. Advanced atherosclerotic changes of the right SFA with multifocal calcified and soft plaque. Short segment critical stenosis in the proximal third. Additional high-grade stenosis in the mid third. At least 50% narrowing within the adductor canal. Popliteal artery is patent with moderate atherosclerotic changes. Proximal anterior tibial artery patent. Tibioperoneal trunk is patent with calcified plaque in the mid segment. Anterior tibial artery patent from the origin to the ankle with mild to moderate calcified plaque. Posterior tibial artery is small caliber with calcifications, appears to be occluded distally. Peroneal artery is patent proximally, small caliber, with some calcifications. Uncertain regarding patency distally. Left lower extremity: Moderate atherosclerotic changes of the left iliac system. No aneurysm, dissection, high-grade stenosis or occlusion. Hypogastric is patent with advanced atherosclerotic changes at the origin. External iliac artery patent with moderate atherosclerotic changes. Common femoral artery patent with calcified plaque on the posterior wall. Profunda femoris and thigh branches patent. Advanced atherosclerotic changes of the left  SFA with multifocal narrowing. 50% narrowing short segment proximal third. Critical stenosis in the mid third at the abductor canal. Tandem critical stenosis in the above knee popliteal artery. Popliteal artery patent with circumferential mild plaque. Patency of the anterior tibial artery uncertain given the degree of calcifications. Tibioperoneal trunk demonstrates at least moderate atherosclerotic calcifications and appears patent to the presumed site of the bifurcation. The length of the posterior tibial artery is not well visualized, except for linear calcifications in the expected location presumably occluded. Peroneal artery patency is uncertain. Veins: Unremarkable IVC and iliac veins. Engorged left great saphenous vein with tortuous accessory saphenous of the thigh associated with multiple engorged varicosities of the left calf. On the right there is a smaller  network of superficial venous varicosities associated with the great saphenous network, including distal thigh and the calf. Perforator veins are evident associated with the peroneal vein of the calf. Review of the MIP images confirms the above findings. NON-VASCULAR Lower chest: No acute finding at the lung bases Hepatobiliary: Unremarkable appearance of the liver. Gallbladder is contracted with possible wall thickening. No radiopaque stones within the gallbladder lumen. No inflammatory changes or pericholecystic fluid. Pancreas: Unremarkable. Spleen: Unremarkable. Adrenals/Urinary Tract: - Right adrenal gland: Unremarkable - Left adrenal gland: Unremarkable. - Right kidney: No hydronephrosis, nephrolithiasis, inflammation, or ureteral dilation. No focal lesion. - Left Kidney: No hydronephrosis, nephrolithiasis, inflammation, or ureteral dilation. No focal lesion. - Urinary Bladder: Urinary bladder partially distended. Stomach/Bowel: - Stomach: Unremarkable. - Small bowel: No abnormally distended small bowel. Partially fecalized distal small bowel. No  focal inflammatory changes or air-fluid levels. No mesenteric inflammatory changes. - Appendix: Appendix is not visualized, however, no inflammatory changes are present adjacent to the cecum to indicate an appendicitis. - Colon: Colonic diverticula, without associated inflammatory changes. Lymphatic: Enlarged lymph nodes of the left greater than right inguinal nodal stations. Small lymph nodes along the left iliac nodal chain, associated with the left inguinal adenopathy and the left foot wound. Mesenteric: No free fluid or air. No mesenteric adenopathy. Reproductive: Unremarkable uterus/adnexa. Other: Fat containing umbilical hernia. Nonspecific pelvic floor laxity Musculoskeletal: Surgical changes of right hip fixation. Degenerative changes of the spine. Compression fracture of L1 with less than 50% height loss. Indeterminate age. Circumferential soft tissue swelling of the left greater than right calf. Ulceration evident at the plantar aspect of the left foot IMPRESSION: Multi segment PA D including: -aortic atherosclerosis.  Aortic Atherosclerosis (ICD10-I70.0). -moderate bilateral iliac arterial disease without high-grade stenosis or occlusion of the CIA or EIA. Likely high-grade stenosis of left hypogastric origin. -advanced bilateral femoropopliteal disease, with multifocal high-grade SFA stenoses bilaterally. -bilateral tibial arterial disease. On the right the AT appears patent from the origin to the ankle, with uncertain patency of the PT and the peroneal artery. On the left, the peroneal artery is partially patent, with uncertain patency of the AT, PT, and distal peroneal artery. 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 Bilateral renal arterial disease without high-grade stenosis or occlusion. Evidence of bilateral, left greater than right venous insufficiency of the lower extremities. Left inguinal adenopathy and associated left iliac chain lymph nodes. These are presumably  reactive given the known chronic left foot wound/ulceration. Additional ancillary findings as above. Signed, Yvone Neu. Reyne Dumas, RPVI [facility] Radiology Electronically Signed   By: Gilmer Mor D.O.   On: 06/27/2020 16:38    Scheduled Meds: . (feeding supplement) PROSource Plus  30 mL Oral Daily  . bisoprolol  5 mg Oral Daily  . calcium-vitamin D  1 tablet Oral Daily  . Chlorhexidine Gluconate Cloth  6 each Topical Q0600  . collagenase   Topical BID  . diltiazem  120 mg Oral Daily  . enoxaparin (LOVENOX) injection  70 mg Subcutaneous Q12H  . furosemide  60 mg Oral BID  . insulin aspart  0-5 Units Subcutaneous QHS  . insulin aspart  0-9 Units Subcutaneous TID WC  . insulin NPH Human  14 Units Subcutaneous BID AC & HS  . isosorbide mononitrate  15 mg Oral Daily  . levothyroxine  137 mcg Oral Daily  . multivitamin with minerals  1 tablet Oral Daily  . mupirocin ointment  1 application Nasal BID  . nicotine  7 mg Transdermal Daily  . pravastatin  20 mg Oral Daily  . sodium chloride flush  3 mL Intravenous Q12H  . spironolactone  12.5 mg Oral BID  . zinc sulfate  220 mg Oral Daily    Continuous Infusions: . sodium chloride    . cefTRIAXone (ROCEPHIN)  IV 2 g (06/27/20 1728)     LOS: 3 days     Hollice Espy, MD [facility]   06/28/2020, 12:59 PM

## 2020-06-29 DIAGNOSIS — M868X7 Other osteomyelitis, ankle and foot: Secondary | ICD-10-CM | POA: Diagnosis not present

## 2020-06-29 LAB — GLUCOSE, CAPILLARY
Glucose-Capillary: 195 mg/dL — ABNORMAL HIGH (ref 70–99)
Glucose-Capillary: 140 mg/dL — ABNORMAL HIGH (ref 70–99)
Glucose-Capillary: 107 mg/dL — ABNORMAL HIGH (ref 70–99)
Glucose-Capillary: 146 mg/dL — ABNORMAL HIGH (ref 70–99)

## 2020-06-29 MED ORDER — ASPIRIN 81 MG PO CHEW
81.0000 mg | CHEWABLE_TABLET | Freq: Every day | ORAL | Status: DC
  Administered 2020-06-29 – 2020-07-03 (×5): 81 mg via ORAL
  Filled 2020-06-29 (×5): qty 1

## 2020-06-29 MED ORDER — CLOPIDOGREL BISULFATE 75 MG PO TABS
75.0000 mg | ORAL_TABLET | Freq: Every day | ORAL | Status: DC
Start: 2020-06-29 — End: 2020-07-03
  Administered 2020-06-29 – 2020-07-02 (×4): 75 mg via ORAL
  Filled 2020-06-29 (×5): qty 1

## 2020-06-29 MED ORDER — EXENATIDE 10 MCG/0.04ML ~~LOC~~ SOPN: 10.0000 ug | PEN_INJECTOR | Freq: Two times a day (BID) | SUBCUTANEOUS | Status: DC

## 2020-06-29 NOTE — Progress Notes (Signed)
PROGRESS NOTE  Tara Flores  DOB: [birth year]  PCP: Bernerd Limbo, MD QHU:[id number]  DOA: 06/25/2020  LOS: 4 days   Chief Complaint  Patient presents with  . Foot Problem   Brief narrative: Tara Flores is a 85 y.o. female with PMH significant for DM2, chronic diastolic CHF, atrial fibrillation, peripheral vascular disease, ongoing tobacco abuse and status post partial left foot transmetatarsal amputation on 4/18 who is a long-term care resident at [facility].  Patient was sent to the ED on 1/4 with grossly worsening redness and swelling in her left foot going up her leg.   In the ED, x-ray of left foot noted osteomyelitis of the left cuboid.   Admitted under hospitalist service.   General surgery was consulted for possible need of amputation.  However patient is not yet clear if she wants to proceed with amputation.  Palliative care consultation was obtained.  1/6, patient underwent bedside debridement by general surgery of left foot stump. 1/6, CT angiogram of aorta and bifemoral showed multilevel peripheral artery disease including multifocal high-grade SFA stenosis bilaterally. 1/7, patient had angiogram done by IR she underwent treatment of left SFA stenosis and left peroneal artery occlusion.  She had drug eluting ballooning of the left SFA with 0% residual stenting and PTA of the left peroneal artery restoring 100% improved flow.   Subjective: Patient was seen and examined this morning.  Elderly Caucasian female.  Lying on bed.  Not in distress.  Left foot has bandage on with some soakage from fresh blood.  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  Amputation recommended which patient has not agreed to yet. -Currently on IV Rocephin. -No fever.  WBC count normal.  Peripheral vascular disease Ongoing tobacco use -1/7, patient had angiogram done by IR she  underwent treatment of left SFA stenosis and left peroneal artery occlusion.  She had drug eluting ballooning of the left SFA with 0% residual stenting and PTA of the left peroneal artery restoring 100% improved flow. -Counseled to quit smoking.  Chronic diastolic CHF Essential hypertension -Currently blood pressure is stable, CHF stable -Home meds include Bisoprolol 5 mg daily, Cardizem 120 mg daily, Lasix 80 mg twice daily, Imdur 15 mg daily, Aldactone 25 mg twice daily. -Continue all.  Atrial fibrillation -Continue bisoprolol, Cardizem.  Continue anticoagulation with Eliquis.  Type 2 diabetes mellitus -A1c 9.6 on 1/4 -Home meds include Exenatide 10 mcg twice daily, NPH 40 units and 20 units, sliding scale insulin, Jardiance 10 mg daily. -Currently on reduced dose of Novolin at 14 units twice daily and sliding scale insulin with Accu-Cheks. -Blood sugar level consistently over 200.  I would resume exenatide twice daily.  Continue to monitor. Recent Labs  Lab 06/28/20 0730 06/28/20 1108 06/28/20 1657 06/28/20 2150 06/29/20 0749  GLUCAP 295* 281* 245* 285* 195*   Hypothyroidism -Continue Synthroid.  COPD  -Stable.    Anxiety  -Continue Xanax 0.25 mg twice daily,  Mobility: Long-term nursing home resident Code Status:   Code Status: DNR.  Consultation appreciated Nutritional status: Body mass index is 29.8 kg/m.     Diet Order            Diet NPO time specified Except for: Sips with Meds  Diet effective now                 DVT prophylaxis: Lovenox subcu   Antimicrobials:  IV Rocephin  Fluid: None  Consultants: General surgery, palliative care Family Communication:  None at bedside  Status is: Inpatient  Remains inpatient appropriate because -patient has not made a decision about amputation yet.  Dispo: The patient is from: SNF              Anticipated d/c is to: SNF              Anticipated d/c date is: > 3 days              Patient currently is not  medically stable to d/c.       Infusions:  . sodium chloride    . cefTRIAXone (ROCEPHIN)  IV 2 g (06/27/20 1728)    Scheduled Meds: . (feeding supplement) PROSource Plus  30 mL Oral Daily  . bisoprolol  5 mg Oral Daily  . calcium-vitamin D  1 tablet Oral Daily  . Chlorhexidine Gluconate Cloth  6 each Topical Q0600  . collagenase   Topical BID  . diltiazem  120 mg Oral Daily  . enoxaparin (LOVENOX) injection  70 mg Subcutaneous Q12H  . exenatide  10 mcg Subcutaneous BID WC  . furosemide  60 mg Oral BID  . insulin aspart  0-5 Units Subcutaneous QHS  . insulin aspart  0-9 Units Subcutaneous TID WC  . insulin NPH Human  14 Units Subcutaneous BID AC & HS  . isosorbide mononitrate  15 mg Oral Daily  . levothyroxine  137 mcg Oral Daily  . multivitamin with minerals  1 tablet Oral Daily  . mupirocin ointment  1 application Nasal BID  . nicotine  7 mg Transdermal Daily  . pravastatin  20 mg Oral Daily  . sodium chloride flush  3 mL Intravenous Q12H  . spironolactone  12.5 mg Oral BID  . zinc sulfate  220 mg Oral Daily    Antimicrobials: Anti-infectives (From admission, onward)   Start     Dose/Rate Route Frequency Ordered Stop   06/26/20 1600  vancomycin (VANCOREADY) IVPB 750 mg/150 mL  Status:  Discontinued        750 mg 150 mL/hr over 60 Minutes Intravenous Every 24 hours 06/25/20 1458 06/28/20 1026   06/25/20 1600  cefTRIAXone (ROCEPHIN) 2 g in sodium chloride 0.9 % 100 mL IVPB        2 g 200 mL/hr over 30 Minutes Intravenous Every 24 hours 06/25/20 1449     06/25/20 1415  vancomycin (VANCOREADY) IVPB 1500 mg/300 mL        1,500 mg 150 mL/hr over 120 Minutes Intravenous  Once 06/25/20 1356 06/25/20 1721   06/25/20 1400  cefTRIAXone (ROCEPHIN) 2 g in sodium chloride 0.9 % 100 mL IVPB  Status:  Discontinued        2 g 200 mL/hr over 30 Minutes Intravenous  Once 06/25/20 1349 06/25/20 1449      PRN meds: sodium chloride, acetaminophen **OR** acetaminophen, ALPRAZolam,  levalbuterol, nitroGLYCERIN, ondansetron **OR** ondansetron (ZOFRAN) IV, sodium chloride flush   Objective: Vitals:   06/29/20 0800 06/29/20 0900  BP: (!) 140/101 91/60  Pulse: 84 81  Resp: (!) 21 (!) 29  Temp: (!) 97.5 F (36.4 C)   SpO2: 100% 99%    Intake/Output Summary (Last 24 hours) at 06/29/2020 1106 Last data filed at 06/29/2020 0800 Gross per 24 hour  Intake 340 ml  Output -  Net 340 ml   Filed Weights   06/27/20 0539 06/28/20 0515 06/29/20 0405  Weight: 73.6 kg 74.4 kg 73.9 kg   Weight  change: -0.5 kg Body mass index is 29.8 kg/m.   Physical Exam: General exam: Elderly Caucasian female.  Not in physical distress Skin: No rashes, lesions or ulcers. HEENT: Atraumatic, normocephalic, no obvious bleeding Lungs: Clear to auscultation bilaterally CVS: Regular rate and rhythm, no murmur GI/Abd soft, nontender, nondistended, bowel sound present CNS: Alert, awake, oriented x3 Psychiatry: Depressed look Extremities: Left foot stump bandaged.  Has mild soakage of blood.  Data Review: I have personally reviewed the laboratory data and studies available.  Recent Labs  Lab 06/25/20 1201 06/26/20 0417 06/28/20 0632  WBC 10.4 9.2 7.1  NEUTROABS 8.4*  --   --   HGB 12.8 11.5* 11.9*  HCT 40.3 36.5 37.7  MCV 97.8 97.6 98.2  PLT 377 350 334   Recent Labs  Lab 06/25/20 1201 06/26/20 0417 06/28/20 0632  NA 136 134* 134*  K 4.5 3.6 3.8  CL 95* 98 96*  CO2 31 27 29   GLUCOSE 69* 114* 345*  BUN 27* 25* 18  CREATININE 0.88 0.74 0.88  CALCIUM 9.4 8.8* 8.9  MG  --  2.2  --     F/u labs ordered.  Signed, , MD [facility] 06/29/2020

## 2020-06-29 NOTE — Progress Notes (Signed)
Neurointerventional Radiology Brief Note:  PA called to unit to follow-up s/p treatment of left SFA stenosis and left peroneal artery occlusion resulting in restoration of inline flow into the dorsalis pedis via DEB of the left SFA with 0% residual stenosis and PTA of the left peroneal artery.   Per RN, patient recovering well post-procedure.  Has been able to sit up at 45 degrees. Diet advanced with tolerance. No complaints r/t groin site or lower extremity.  Procedure site intact with no bruising or oozing.  Vital signs stable.  Afebrile.  WBC 7.1 BMP ordered, not yet resulted.   Patient stable s/p procedure yesterday. Ok to transfer from ICU per discretion of hospitalist team.   Loyce Dys, MS RD PA-C 1:01 PM

## 2020-06-29 NOTE — Progress Notes (Signed)
ANTICOAGULATION CONSULT NOTE -Consult  Pharmacy Consult for lovenox Indication: atrial fibrillation  Allergies  Allergen Reactions  . Avelox [Moxifloxacin]   . Brethine [Terbutaline]     Made patient confused  . Keflex [Cephalexin] Nausea Only    Loss of appetite    Patient Measurements: Height: 5\' 2"  (157.5 cm) Weight: 73.9 kg (162 lb 14.7 oz) IBW/kg (Calculated) : 50.1 Heparin Dosing Weight:   Vital Signs: Temp: 97.5 F (36.4 C) (01/08 0405) Temp Source: Axillary (01/08 0019) BP: 140/101 (01/08 0800) Pulse Rate: 84 (01/08 0800)  Labs: Recent Labs    06/28/20 0632  HGB 11.9*  HCT 37.7  PLT 334  LABPROT 12.2  INR 0.9  CREATININE 0.88    Estimated Creatinine Clearance: 46.4 mL/min (by C-G formula based on SCr of 0.88 mg/dL).   Medical History: Past Medical History:  Diagnosis Date  . Acquired absence of left foot (HCC)   . Anemia in chronic kidney disease   . Anxiety   . Arthritis   . Asthma   . Atherosclerotic heart disease   . Atrial fibrillation (HCC)   . CAD (coronary artery disease)   . Cardiac disease   . CHF (congestive heart failure) (HCC)   . CHF (congestive heart failure) (HCC)   . COPD (chronic obstructive pulmonary disease) (HCC)   . Depression   . Diabetes mellitus without complication (HCC)   . Diabetic neuropathy (HCC)   . Emphysema of lung (HCC)   . Hyperlipidemia   . Hypertension   . Hypothyroidism   . Mild cognitive impairment   . Peripheral vascular disease (HCC)   . TIA (transient ischemic attack)     Medications:  Medications Prior to Admission  Medication Sig Dispense Refill Last Dose  . acetaminophen (TYLENOL) 325 MG tablet Take 650 mg by mouth every 4 (four) hours as needed for mild pain.   06/24/2020 at 2149  . ALPRAZolam (XANAX) 0.25 MG tablet Take 1 tablet (0.25 mg total) by mouth 2 (two) times daily as needed for anxiety. 10 tablet 0 Past Week at Unknown time  . Amino Acids-Protein Hydrolys (FEEDING SUPPLEMENT,  PRO-STAT SUGAR FREE 64,) LIQD Take 30 mLs by mouth daily.   06/24/2020 at Unknown time  . apixaban (ELIQUIS) 5 MG TABS tablet Take 1 tablet (5 mg total) by mouth 2 (two) times daily. 60 tablet 0 06/24/2020 at 1600  . Ascorbic Acid (VITAMIN C PO) Take 500 mg by mouth 2 (two) times daily.   06/25/2020 at Unknown time  . bisoprolol (ZEBETA) 5 MG tablet Take 1 tablet (5 mg total) by mouth daily. 30 tablet 0 06/24/2020 at Unknown time  . BYETTA 10 MCG PEN 10 MCG/0.04ML SOPN injection Inject 10 mcg into the skin in the morning and at bedtime.   06/25/2020 at Unknown time  . Calcium Carb-Cholecalciferol 500-400 MG-UNIT TABS Take 1 tablet by mouth daily.   06/24/2020 at Unknown time  . COMBIVENT RESPIMAT 20-100 MCG/ACT AERS respimat    06/25/2020 at Unknown time  . diltiazem (TIAZAC) 240 MG 24 hr capsule Take 120 mg by mouth daily.   06/24/2020 at Unknown time  . doxycycline (VIBRA-TABS) 100 MG tablet Take 100 mg by mouth 2 (two) times daily. For 7 days   06/24/2020 at Unknown time  . furosemide (LASIX) 20 MG tablet Take 3 tablets (60 mg total) by mouth 2 (two) times daily. (Patient taking differently: Take 80 mg by mouth 2 (two) times daily.) 180 tablet 0 06/24/2020 at Unknown time  .  Infant Care Products (BABY SHAMPOO EX) Apply 1 application topically daily. Apply to eyelids every morning for irritation   06/24/2020 at Unknown time  . insulin lispro (HUMALOG) 100 UNIT/ML injection Inject 5-10 Units into the skin 3 (three) times daily as needed. Take 5 units every evening and as needed with meals per sliding scale; sliding scale - blood glucose 71-150 give 0 units, 151-200 give 2 units, 201-250 give 4 units. 251-300 give 6 units, 301-350 give 8 units, and for 351-400 give 10 units and call provider   06/24/2020 at Unknown time  . insulin NPH Human (HUMULIN N,NOVOLIN N) 100 UNIT/ML injection Inject 20-30 Units into the skin 2 (two) times daily. 40units in the morning and 20 units bedtime   06/25/2020 at Unknown time  . isosorbide  mononitrate (IMDUR) 30 MG 24 hr tablet Take 0.5 tablets (15 mg total) by mouth daily. 30 tablet 0 06/24/2020 at Unknown time  . JARDIANCE 10 MG TABS tablet Take 10 mg by mouth daily.   06/25/2020 at Unknown time  . Multiple Vitamin (MULTIVITAMIN WITH MINERALS) TABS tablet Take 1 tablet by mouth daily.   06/24/2020 at Unknown time  . nitroGLYCERIN (NITROSTAT) 0.4 MG SL tablet Place 1 tablet (0.4 mg total) under the tongue every 5 (five) minutes as needed for chest pain. 25 tablet 3   . pravastatin (PRAVACHOL) 20 MG tablet Take 20 mg by mouth daily.   06/24/2020 at Unknown time  . PROAIR HFA 108 (90 Base) MCG/ACT inhaler      . spironolactone (ALDACTONE) 25 MG tablet Take 0.5 tablets (12.5 mg total) by mouth 2 (two) times daily. (Patient taking differently: Take 25 mg by mouth 2 (two) times daily.) 30 tablet 0 06/24/2020 at Unknown time  . SYNTHROID 137 MCG tablet Take 137 mcg by mouth daily.   06/25/2020 at Unknown time  . zinc sulfate 220 (50 Zn) MG capsule Take 220 mg by mouth daily.   06/24/2020 at Unknown time  . lisinopril (PRINIVIL,ZESTRIL) 2.5 MG tablet Take 1 tablet (2.5 mg total) by mouth daily. (Patient not taking: Reported on 06/25/2020) 30 tablet 0 Not Taking at Unknown time    Assessment: Pharmacy consulted to dose lovenox in patient with atrial fibrillation.  Patient on Eliquis prior to admission with last dose listed as 1/3 1600.    CBC WNL  Goal of Therapy:   Monitor platelets by anticoagulation protocol: Yes   Plan:  Lovenox 70 mg subq every 12 hours. Monitor labs and s/s of bleeding.  Gerre Pebbles Davide Risdon 06/29/2020,8:17 AM

## 2020-06-29 NOTE — Progress Notes (Signed)
Changed dressing over left shin area where there were two skin tears that are still bleeding from yesterday. Placed vasoline gauze and covered with 4x4 and kurlex.

## 2020-06-30 DIAGNOSIS — I5032 Chronic diastolic (congestive) heart failure: Secondary | ICD-10-CM | POA: Diagnosis not present

## 2020-06-30 DIAGNOSIS — I4891 Unspecified atrial fibrillation: Secondary | ICD-10-CM | POA: Diagnosis not present

## 2020-06-30 DIAGNOSIS — I10 Essential (primary) hypertension: Secondary | ICD-10-CM | POA: Diagnosis not present

## 2020-06-30 DIAGNOSIS — M868X7 Other osteomyelitis, ankle and foot: Secondary | ICD-10-CM | POA: Diagnosis not present

## 2020-06-30 LAB — CBC WITH DIFFERENTIAL/PLATELET
Abs Immature Granulocytes: 0.03 10*3/uL (ref 0.00–0.07)
Basophils Absolute: 0.1 10*3/uL (ref 0.0–0.1)
Basophils Relative: 1 %
Eosinophils Absolute: 0.1 10*3/uL (ref 0.0–0.5)
Eosinophils Relative: 2 %
HCT: 37.3 % (ref 36.0–46.0)
Hemoglobin: 11.3 g/dL — ABNORMAL LOW (ref 12.0–15.0)
Immature Granulocytes: 1 %
Lymphocytes Relative: 19 %
Lymphs Abs: 1.1 10*3/uL (ref 0.7–4.0)
MCH: 30.2 pg (ref 26.0–34.0)
MCHC: 30.3 g/dL (ref 30.0–36.0)
MCV: 99.7 fL (ref 80.0–100.0)
Monocytes Absolute: 1 10*3/uL (ref 0.1–1.0)
Monocytes Relative: 16 %
Neutro Abs: 3.7 10*3/uL (ref 1.7–7.7)
Neutrophils Relative %: 61 %
Platelets: 271 10*3/uL (ref 150–400)
RBC: 3.74 MIL/uL — ABNORMAL LOW (ref 3.87–5.11)
RDW: 13.1 % (ref 11.5–15.5)
WBC: 6 10*3/uL (ref 4.0–10.5)
nRBC: 0 % (ref 0.0–0.2)

## 2020-06-30 LAB — BASIC METABOLIC PANEL
Anion gap: 11 (ref 5–15)
BUN: 23 mg/dL (ref 8–23)
CO2: 28 mmol/L (ref 22–32)
Calcium: 8.8 mg/dL — ABNORMAL LOW (ref 8.9–10.3)
Chloride: 92 mmol/L — ABNORMAL LOW (ref 98–111)
Creatinine, Ser: 0.87 mg/dL (ref 0.44–1.00)
GFR, Estimated: >60 mL/min (ref >60)
Glucose, Bld: 338 mg/dL — ABNORMAL HIGH (ref 70–99)
Potassium: 4.2 mmol/L (ref 3.5–5.1)
Sodium: 131 mmol/L — ABNORMAL LOW (ref 135–145)

## 2020-06-30 LAB — CULTURE, BLOOD (ROUTINE X 2)
Culture: NO GROWTH
Culture: NO GROWTH
Special Requests: ADEQUATE

## 2020-06-30 LAB — GLUCOSE, CAPILLARY
Glucose-Capillary: 340 mg/dL — ABNORMAL HIGH (ref 70–99)
Glucose-Capillary: 305 mg/dL — ABNORMAL HIGH (ref 70–99)
Glucose-Capillary: 233 mg/dL — ABNORMAL HIGH (ref 70–99)
Glucose-Capillary: 266 mg/dL — ABNORMAL HIGH (ref 70–99)

## 2020-06-30 MED ORDER — CHLORHEXIDINE GLUCONATE CLOTH 2 % EX PADS
6.0000 | MEDICATED_PAD | Freq: Every day | CUTANEOUS | Status: DC
  Administered 2020-06-30 – 2020-07-01 (×2): 6 via TOPICAL

## 2020-06-30 NOTE — Progress Notes (Signed)
PROGRESS NOTE  Tara Flores  DOB: 05/13/1936  PCP: Bernerd Limbo, MD TGG:[id number]  DOA: 06/25/2020  LOS: 5 days   Chief Complaint  Patient presents with  . Foot Problem   Brief narrative: Tara Flores is a 85 y.o. female with PMH significant for DM2, chronic diastolic CHF, atrial fibrillation, peripheral vascular disease, ongoing tobacco abuse and status post partial left foot transmetatarsal amputation on 4/18 who is a long-term care resident at [facility].  Patient was sent to the ED on 1/4 with grossly worsening redness and swelling in her left foot going up her leg.   In the ED, x-ray of left foot noted osteomyelitis of the left cuboid.   Admitted under hospitalist service.   General surgery was consulted for possible need of amputation.  However patient is not yet clear if she wants to proceed with amputation.  Palliative care consultation was obtained.  1/6, patient underwent bedside debridement by general surgery of left foot stump. 1/6, CT angiogram of aorta and bifemoral showed multilevel peripheral artery disease including multifocal high-grade SFA stenosis bilaterally. 1/7, patient had angiogram done by IR she underwent treatment of left SFA stenosis and left peroneal artery occlusion.  She had drug eluting ballooning of the left SFA with 0% residual stenting and PTA of the left peroneal artery restoring 100% improved flow.   Subjective: Patient was seen and examined this morning.  Elderly Caucasian female.  Lying on bed.  Not in distress.  Left foot has bandage on with some soakage from fresh blood.  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  Pt adamantly against amputation.  -Currently on IV Rocephin.  Peripheral vascular disease Ongoing tobacco use -1/7, patient had angiogram done by IR she underwent treatment of left SFA stenosis and left peroneal  artery occlusion.  She had drug eluting ballooning of the left SFA with 0% residual stenting and PTA of the left peroneal artery restoring 100% improved flow. -Counseled to quit smoking.  Chronic diastolic CHF Essential hypertension -Currently blood pressure is stable, CHF stable -Home meds include Bisoprolol 5 mg daily, Cardizem 120 mg daily, Lasix 80 mg twice daily, Imdur 15 mg daily, Aldactone 25 mg twice daily.  Atrial fibrillation -Continue bisoprolol, Cardizem.  Continue anticoagulation with Eliquis.  Type 2 diabetes mellitus -A1c 9.6 on 1/4 -Home meds include Exenatide 10 mcg twice daily, NPH 40 units and 20 units, sliding scale insulin, Jardiance 10 mg daily.  Recent Labs  Lab 06/29/20 0749 06/29/20 1112 06/29/20 1707 06/29/20 2025 06/30/20 0737  GLUCAP 195* 140* 107* 146* 340*   Hypothyroidism -Continue Synthroid.  COPD  -Stable.    Anxiety  -Continue Xanax 0.25 mg twice daily,  Mobility: Long-term nursing home resident Code Status:   Code Status: DNR.  Consultation appreciated Nutritional status: Body mass index is 29.48 kg/m.     Diet Order            Diet Carb Modified Fluid consistency: Thin; Room service appropriate? Yes  Diet effective now                 DVT prophylaxis: Lovenox subcu   Antimicrobials:  IV Rocephin  Fluid: None Consultants: General surgery, palliative care Family Communication:  None at bedside  Status is: Inpatient  Remains inpatient appropriate because -patient has not made a decision about amputation yet.  Dispo: The patient is from: SNF  Anticipated d/c is to: SNF              Anticipated d/c date is: 1 day              Patient currently is not medically stable to d/c.  Infusions:  . sodium chloride    . cefTRIAXone (ROCEPHIN)  IV 2 g (06/29/20 1553)    Scheduled Meds: . (feeding supplement) PROSource Plus  30 mL Oral Daily  . aspirin  81 mg Oral Daily  . bisoprolol  5 mg Oral Daily  .  calcium-vitamin D  1 tablet Oral Daily  . Chlorhexidine Gluconate Cloth  6 each Topical Daily  . clopidogrel  75 mg Oral Daily  . collagenase   Topical BID  . diltiazem  120 mg Oral Daily  . enoxaparin (LOVENOX) injection  70 mg Subcutaneous Q12H  . furosemide  60 mg Oral BID  . insulin aspart  0-5 Units Subcutaneous QHS  . insulin aspart  0-9 Units Subcutaneous TID WC  . insulin NPH Human  14 Units Subcutaneous BID AC & HS  . isosorbide mononitrate  15 mg Oral Daily  . levothyroxine  137 mcg Oral Daily  . multivitamin with minerals  1 tablet Oral Daily  . mupirocin ointment  1 application Nasal BID  . nicotine  7 mg Transdermal Daily  . pravastatin  20 mg Oral Daily  . sodium chloride flush  3 mL Intravenous Q12H  . spironolactone  12.5 mg Oral BID  . zinc sulfate  220 mg Oral Daily    Antimicrobials: Anti-infectives (From admission, onward)   Start     Dose/Rate Route Frequency Ordered Stop   06/26/20 1600  vancomycin (VANCOREADY) IVPB 750 mg/150 mL  Status:  Discontinued        750 mg 150 mL/hr over 60 Minutes Intravenous Every 24 hours 06/25/20 1458 06/28/20 1026   06/25/20 1600  cefTRIAXone (ROCEPHIN) 2 g in sodium chloride 0.9 % 100 mL IVPB        2 g 200 mL/hr over 30 Minutes Intravenous Every 24 hours 06/25/20 1449     06/25/20 1415  vancomycin (VANCOREADY) IVPB 1500 mg/300 mL        1,500 mg 150 mL/hr over 120 Minutes Intravenous  Once 06/25/20 1356 06/25/20 1721   06/25/20 1400  cefTRIAXone (ROCEPHIN) 2 g in sodium chloride 0.9 % 100 mL IVPB  Status:  Discontinued        2 g 200 mL/hr over 30 Minutes Intravenous  Once 06/25/20 1349 06/25/20 1449      PRN meds: sodium chloride, acetaminophen **OR** acetaminophen, ALPRAZolam, levalbuterol, nitroGLYCERIN, ondansetron **OR** ondansetron (ZOFRAN) IV, sodium chloride flush   Objective: Vitals:   06/30/20 0600 06/30/20 0730  BP: (!) 148/51   Pulse: 89   Resp: (!) 22   Temp:  98.1 F (36.7 C)  SpO2: 98%      Intake/Output Summary (Last 24 hours) at 06/30/2020 1116 Last data filed at 06/30/2020 0900 Gross per 24 hour  Intake 460.49 ml  Output 1025 ml  Net -564.51 ml   Filed Weights   06/28/20 0515 06/29/20 0405 06/30/20 0516  Weight: 74.4 kg 73.9 kg 73.1 kg   Weight change: -0.8 kg Body mass index is 29.48 kg/m.   Physical Exam: General exam: Elderly female awake, alert, NAD, cooperative.  Skin: No gross lesions. HEENT:  NCAT, neck supple. No JVD.  Lungs: BBS CTA, no rales, crackles or wheezes.  CVS: normal s1, s2 sounds.  No m/r/g.  GI/Abd NDNT, no HSN, no masses, normal BS.  CNS: nonfocal exam.  Psychiatry: normal affect.   Extremities: Left foot stump wound c/d/I.   Data Review: I have personally reviewed the laboratory data and studies available.  Recent Labs  Lab 06/25/20 1201 06/26/20 0417 06/28/20 0632 06/30/20 0646  WBC 10.4 9.2 7.1 6.0  NEUTROABS 8.4*  --   --  3.7  HGB 12.8 11.5* 11.9* 11.3*  HCT 40.3 36.5 37.7 37.3  MCV 97.8 97.6 98.2 99.7  PLT 377 350 334 271   Recent Labs  Lab 06/25/20 1201 06/26/20 0417 06/28/20 0632 06/30/20 0646  NA 136 134* 134* 131*  K 4.5 3.6 3.8 4.2  CL 95* 98 96* 92*  CO2 31 27 29 28   GLUCOSE 69* 114* 345* 338*  BUN 27* 25* 18 23  CREATININE 0.88 0.74 0.88 0.87  CALCIUM 9.4 8.8* 8.9 8.8*  MG  --  2.2  --   --     F/u labs ordered.  Signed, , MD [facility] 06/30/2020

## 2020-06-30 NOTE — Progress Notes (Signed)
Dressing change to lower left leg. Removed old dressing (vaseline gauze, gauze, transparent dressing, and kerlex) heavily soiled with sanguinous drainage. Cleaned site with sterile water and redressed with xeroform, gauze for pressure, foam, and kerlex. Pt tolerated well. Elevated leg on pillow. Will continue to monitor.

## 2020-07-01 ENCOUNTER — Inpatient Hospital Stay (HOSPITAL_COMMUNITY): Payer: Medicare Other

## 2020-07-01 DIAGNOSIS — Z7189 Other specified counseling: Secondary | ICD-10-CM | POA: Diagnosis not present

## 2020-07-01 DIAGNOSIS — M868X7 Other osteomyelitis, ankle and foot: Secondary | ICD-10-CM | POA: Diagnosis not present

## 2020-07-01 DIAGNOSIS — I5032 Chronic diastolic (congestive) heart failure: Secondary | ICD-10-CM | POA: Diagnosis not present

## 2020-07-01 DIAGNOSIS — I4891 Unspecified atrial fibrillation: Secondary | ICD-10-CM | POA: Diagnosis not present

## 2020-07-01 DIAGNOSIS — Z515 Encounter for palliative care: Secondary | ICD-10-CM | POA: Diagnosis not present

## 2020-07-01 HISTORY — PX: IR TIB-PERO ART ATHEREC INC PTA MOD SED: IMG2314

## 2020-07-01 HISTORY — PX: IR US GUIDE VASC ACCESS LEFT: IMG2389

## 2020-07-01 HISTORY — PX: IR FEM POP ART ATHERECT INC PTA MOD SED: IMG2310

## 2020-07-01 HISTORY — PX: IR US GUIDE VASC ACCESS RIGHT: IMG2390

## 2020-07-01 HISTORY — PX: IR ANGIOGRAM EXTREMITY LEFT: IMG651

## 2020-07-01 LAB — CBC WITH DIFFERENTIAL/PLATELET
Abs Immature Granulocytes: 0.01 10*3/uL (ref 0.00–0.07)
Basophils Absolute: 0 10*3/uL (ref 0.0–0.1)
Basophils Relative: 1 %
Eosinophils Absolute: 0.2 10*3/uL (ref 0.0–0.5)
Eosinophils Relative: 3 %
HCT: 33.2 % — ABNORMAL LOW (ref 36.0–46.0)
Hemoglobin: 10.3 g/dL — ABNORMAL LOW (ref 12.0–15.0)
Immature Granulocytes: 0 %
Lymphocytes Relative: 29 %
Lymphs Abs: 1.4 10*3/uL (ref 0.7–4.0)
MCH: 30.5 pg (ref 26.0–34.0)
MCHC: 31 g/dL (ref 30.0–36.0)
MCV: 98.2 fL (ref 80.0–100.0)
Monocytes Absolute: 0.8 10*3/uL (ref 0.1–1.0)
Monocytes Relative: 18 %
Neutro Abs: 2.3 10*3/uL (ref 1.7–7.7)
Neutrophils Relative %: 49 %
Platelets: 265 10*3/uL (ref 150–400)
RBC: 3.38 MIL/uL — ABNORMAL LOW (ref 3.87–5.11)
RDW: 13 % (ref 11.5–15.5)
WBC: 4.7 10*3/uL (ref 4.0–10.5)
nRBC: 0 % (ref 0.0–0.2)

## 2020-07-01 LAB — BASIC METABOLIC PANEL
Anion gap: 12 (ref 5–15)
BUN: 25 mg/dL — ABNORMAL HIGH (ref 8–23)
CO2: 30 mmol/L (ref 22–32)
Calcium: 8.4 mg/dL — ABNORMAL LOW (ref 8.9–10.3)
Chloride: 90 mmol/L — ABNORMAL LOW (ref 98–111)
Creatinine, Ser: 0.81 mg/dL (ref 0.44–1.00)
GFR, Estimated: >60 mL/min (ref >60)
Glucose, Bld: 246 mg/dL — ABNORMAL HIGH (ref 70–99)
Potassium: 3.9 mmol/L (ref 3.5–5.1)
Sodium: 132 mmol/L — ABNORMAL LOW (ref 135–145)

## 2020-07-01 LAB — POCT ACTIVATED CLOTTING TIME: Activated Clotting Time: 225 seconds

## 2020-07-01 LAB — GLUCOSE, CAPILLARY
Glucose-Capillary: 255 mg/dL — ABNORMAL HIGH (ref 70–99)
Glucose-Capillary: 358 mg/dL — ABNORMAL HIGH (ref 70–99)
Glucose-Capillary: 317 mg/dL — ABNORMAL HIGH (ref 70–99)
Glucose-Capillary: 265 mg/dL — ABNORMAL HIGH (ref 70–99)

## 2020-07-01 LAB — SARS CORONAVIRUS 2 (TAT 6-24 HRS): SARS Coronavirus 2: POSITIVE — AB

## 2020-07-01 LAB — RESP PANEL BY RT-PCR (FLU A&B, COVID) ARPGX2
Influenza A by PCR: NEGATIVE
Influenza B by PCR: NEGATIVE
SARS Coronavirus 2 by RT PCR: POSITIVE — AB

## 2020-07-01 MED ORDER — INSULIN NPH (HUMAN) (ISOPHANE) 100 UNIT/ML ~~LOC~~ SUSP
20.0000 [IU] | Freq: Two times a day (BID) | SUBCUTANEOUS | Status: DC
  Administered 2020-07-02: 20 [IU] via SUBCUTANEOUS
  Filled 2020-07-01: qty 10

## 2020-07-01 MED ORDER — INSULIN NPH (HUMAN) (ISOPHANE) 100 UNIT/ML ~~LOC~~ SUSP
16.0000 [IU] | Freq: Two times a day (BID) | SUBCUTANEOUS | Status: DC
  Filled 2020-07-01: qty 10

## 2020-07-01 NOTE — Care Management Important Message (Signed)
Important Message  Patient Details  Name: Tara Flores MRN: [id number] Date of Birth: [birth year]   Medicare Important Message Given:  Yes - Important Message mailed due to current National Emergency     Corey Harold 07/01/2020, 1:06 PM

## 2020-07-01 NOTE — TOC Progression Note (Signed)
Transition of Care [facility]) - Progression Note    Patient Details  Name: JACAYLA NORDELL MRN: [id number] Date of Birth: [birth year]  Transition of Care [facility] CM/SW Contact  Karn Cassis, [place] Phone Number: 07/01/2020, 1:58 PM  Clinical Narrative:  LCSW notified by RN that pt's COVID test came back positive. PCR also done this morning and awaiting results. LCSW spoke with Melissa at [facility] who reports pt can return tomorrow even if PCR is positive, but they need confirmation so they know which hall to put her on. MD and RN updated.       Expected Discharge Plan: Skilled Nursing Facility Barriers to Discharge: Continued Medical Work up  Expected Discharge Plan and Services Expected Discharge Plan: Skilled Nursing Facility In-house Referral: Clinical Social Work Discharge Planning Services: NA Post Acute Care Choice: Skilled Nursing Facility Living arrangements for the past 2 months: Skilled Nursing Facility                 DME Arranged: N/A DME Agency: NA       HH Arranged: NA HH Agency: NA         Social Determinants of Health (SDOH) Interventions    Readmission Risk Interventions No flowsheet data found.

## 2020-07-01 NOTE — Progress Notes (Signed)
PROGRESS NOTE  Tara Flores  DOB: 15-Aug-1935  PCP: Bernerd Limbo, MD XBJ:[id number]  DOA: 06/25/2020  LOS: 6 days   Chief Complaint  Patient presents with  . Foot Problem   Brief narrative: Tara Flores is a 85 y.o. female with PMH significant for DM2, chronic diastolic CHF, atrial fibrillation, peripheral vascular disease, ongoing tobacco abuse and status post partial left foot transmetatarsal amputation on 4/18 who is a long-term care resident at [facility].  Patient was sent to the ED on 1/4 with grossly worsening redness and swelling in her left foot going up her leg.   In the ED, x-ray of left foot noted osteomyelitis of the left cuboid.   Admitted under hospitalist service.   General surgery was consulted for possible need of amputation.  However patient is not yet clear if she wants to proceed with amputation.  Palliative care consultation was obtained.  1/6, patient underwent bedside debridement by general surgery of left foot stump. 1/6, CT angiogram of aorta and bifemoral showed multilevel peripheral artery disease including multifocal high-grade SFA stenosis bilaterally. 1/7, patient had angiogram done by IR she underwent treatment of left SFA stenosis and left peroneal artery occlusion.  She had drug eluting ballooning of the left SFA with 0% residual stenting and PTA of the left peroneal artery restoring 100% improved flow.   Subjective: Patient was seen and examined this morning.  Elderly Caucasian female.  Lying on bed.  Not in distress.  Left foot has bandage on with some soakage from fresh blood.  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  Pt adamantly against amputation.  -Currently on IV Rocephin.   MRI confirms osteomyelitis PT REFUSES SURGERY  Peripheral vascular disease Ongoing tobacco use -1/7, patient had angiogram done by IR she underwent  treatment of left SFA stenosis and left peroneal artery occlusion.  She had drug eluting ballooning of the left SFA with 0% residual stenting and PTA of the left peroneal artery restoring 100% improved flow. -Counseled to quit smoking.  Possible Covid Infection - Airborne isolation - asymptomatic.  Awaiting PCR confirmation test.   Chronic diastolic CHF Essential hypertension -Currently blood pressure is stable, CHF stable -Home meds include Bisoprolol 5 mg daily, Cardizem 120 mg daily, Lasix 80 mg twice daily, Imdur 15 mg daily, Aldactone 25 mg twice daily.  Atrial fibrillation -Continue bisoprolol, Cardizem.  Continue anticoagulation with Eliquis.  Type 2 diabetes mellitus -A1c 9.6 on 1/4 -Home meds include Exenatide 10 mcg twice daily, NPH 40 units and 20 units, sliding scale insulin, Jardiance 10 mg daily.  PT REFUSING TO TAKE MEDS TODAY  Recent Labs  Lab 06/30/20 1616 06/30/20 2049 07/01/20 0728 07/01/20 1058 07/01/20 1617  GLUCAP 233* 266* 255* 358* 317*   Hypothyroidism -Continue Synthroid.  COPD  -Stable.    Anxiety  -Continue Xanax 0.25 mg twice daily,  Mobility: Long-term nursing home resident Code Status:   Code Status: DNR.  Consultation appreciated Nutritional status: Body mass index is 29.48 kg/m.     Diet Order            Diet Carb Modified Fluid consistency: Thin; Room service appropriate? Yes  Diet effective now                 DVT prophylaxis: Lovenox subcu   Antimicrobials:  IV Rocephin  Fluid: None Consultants: General surgery, palliative care Family Communication:  None at bedside  Status is:  Inpatient  Remains inpatient appropriate because -patient has not made a decision about amputation yet.  Dispo: The patient is from: SNF              Anticipated d/c is to: SNF              Anticipated d/c date is: 1 day              Patient currently is not medically stable to d/c.  Infusions:  . sodium chloride    . cefTRIAXone  (ROCEPHIN)  IV 2 g (06/30/20 1702)    Scheduled Meds: . (feeding supplement) PROSource Plus  30 mL Oral Daily  . aspirin  81 mg Oral Daily  . bisoprolol  5 mg Oral Daily  . calcium-vitamin D  1 tablet Oral Daily  . Chlorhexidine Gluconate Cloth  6 each Topical Daily  . clopidogrel  75 mg Oral Daily  . collagenase   Topical BID  . diltiazem  120 mg Oral Daily  . enoxaparin (LOVENOX) injection  70 mg Subcutaneous Q12H  . furosemide  60 mg Oral BID  . insulin aspart  0-5 Units Subcutaneous QHS  . insulin aspart  0-9 Units Subcutaneous TID WC  . insulin NPH Human  20 Units Subcutaneous BID AC & HS  . isosorbide mononitrate  15 mg Oral Daily  . levothyroxine  137 mcg Oral Daily  . multivitamin with minerals  1 tablet Oral Daily  . nicotine  7 mg Transdermal Daily  . pravastatin  20 mg Oral Daily  . sodium chloride flush  3 mL Intravenous Q12H  . spironolactone  12.5 mg Oral BID  . zinc sulfate  220 mg Oral Daily    Antimicrobials: Anti-infectives (From admission, onward)   Start     Dose/Rate Route Frequency Ordered Stop   06/26/20 1600  vancomycin (VANCOREADY) IVPB 750 mg/150 mL  Status:  Discontinued        750 mg 150 mL/hr over 60 Minutes Intravenous Every 24 hours 06/25/20 1458 06/28/20 1026   06/25/20 1600  cefTRIAXone (ROCEPHIN) 2 g in sodium chloride 0.9 % 100 mL IVPB        2 g 200 mL/hr over 30 Minutes Intravenous Every 24 hours 06/25/20 1449     06/25/20 1415  vancomycin (VANCOREADY) IVPB 1500 mg/300 mL        1,500 mg 150 mL/hr over 120 Minutes Intravenous  Once 06/25/20 1356 06/25/20 1721   06/25/20 1400  cefTRIAXone (ROCEPHIN) 2 g in sodium chloride 0.9 % 100 mL IVPB  Status:  Discontinued        2 g 200 mL/hr over 30 Minutes Intravenous  Once 06/25/20 1349 06/25/20 1449      PRN meds: sodium chloride, acetaminophen **OR** acetaminophen, ALPRAZolam, levalbuterol, nitroGLYCERIN, ondansetron **OR** ondansetron (ZOFRAN) IV, sodium chloride flush    Objective: Vitals:   07/01/20 0155 07/01/20 0542  BP: (!) 110/53 (!) 124/59  Pulse: 69 72  Resp: 17 17  Temp: 98.6 F (37 C) 98.9 F (37.2 C)  SpO2: 99% 100%    Intake/Output Summary (Last 24 hours) at 07/01/2020 1818 Last data filed at 07/01/2020 0957 Gross per 24 hour  Intake 360 ml  Output 1050 ml  Net -690 ml   Filed Weights   06/29/20 0405 06/30/20 0516 07/01/20 0542  Weight: 73.9 kg 73.1 kg 73.1 kg   Weight change: 0 kg Body mass index is 29.48 kg/m.   Physical Exam: General exam: Elderly female awake, alert, NAD,  cooperative.  Skin: No gross lesions. HEENT:  NCAT, neck supple. No JVD.  Lungs: BBS CTA, no rales, crackles or wheezes.  CVS: normal s1, s2 sounds.  No m/r/g.  GI/Abd NDNT, no HSN, no masses, normal BS.  CNS: nonfocal exam.  Psychiatry: normal affect.   Extremities: Left foot stump wound c/d/I.   Data Review: I have personally reviewed the laboratory data and studies available.  Recent Labs  Lab 06/25/20 1201 06/26/20 0417 06/28/20 0632 06/30/20 0646 07/01/20 0635  WBC 10.4 9.2 7.1 6.0 4.7  NEUTROABS 8.4*  --   --  3.7 2.3  HGB 12.8 11.5* 11.9* 11.3* 10.3*  HCT 40.3 36.5 37.7 37.3 33.2*  MCV 97.8 97.6 98.2 99.7 98.2  PLT 377 350 334 271 265   Recent Labs  Lab 06/25/20 1201 06/26/20 0417 06/28/20 0632 06/30/20 0646 07/01/20 0635  NA 136 134* 134* 131* 132*  K 4.5 3.6 3.8 4.2 3.9  CL 95* 98 96* 92* 90*  CO2 31 27 29 28 30   GLUCOSE 69* 114* 345* 338* 246*  BUN 27* 25* 18 23 25*  CREATININE 0.88 0.74 0.88 0.87 0.81  CALCIUM 9.4 8.8* 8.9 8.8* 8.4*  MG  --  2.2  --   --   --     F/u labs ordered.  Signed, , MD [facility] 07/01/2020

## 2020-07-01 NOTE — Progress Notes (Signed)
Inpatient Diabetes Program Recommendations  AACE/ADA: New Consensus Statement on Inpatient Glycemic Control (2015)  Target Ranges:  Prepandial:   less than 140 mg/dL      Peak postprandial:   less than 180 mg/dL (1-2 hours)      Critically ill patients:  140 - 180 mg/dL   Results for HARLEAN, REGULA (MRN [id number]) as of 07/01/2020 08:22  Ref. Range 06/30/2020 07:37 06/30/2020 11:26 06/30/2020 16:16 06/30/2020 20:49  Glucose-Capillary Latest Ref Range: 70 - 99 mg/dL 283 (H)  7 units NOVOLOG  14 units NPH @8am   305 (H)  7 units NOVOLOG  233 (H)  3 units NOVOLOG  266 (H)  3 units NOVOLOG  14 units NPH @11pm    Results for SHEKINAH, PITONES (MRN ) as of 07/01/2020 08:22  Ref. Range 07/01/2020 07:28  Glucose-Capillary Latest Ref Range: 70 - 99 mg/dL 08/29/2020 (H)     Home DM Meds: Byetta 10 mcg BID       NPH Insulin 40 units AM/ 20 units QHS       Humalog 5-10 units TID per SSI       Jardiance 10 mg Daily  Current Orders: NPH Insulin 14 units BID      Novolog Sensitive Correction Scale/ SSI (0-9 units) TID AC + HS    MD- Note CBGs >250.  If more aggressive glucose control is within goals of care for this pt (note palliative team following), please consider:  Increase NPH to 20 units BID  May also consider increasing the Novolog Correction scale to the Moderate scale (0-15 units)    --Will follow patient during hospitalization--  08/29/2020 RN, MSN, CDE Diabetes Coordinator Inpatient Glycemic Control Team Team Pager: [phone] (8a-5p)

## 2020-07-01 NOTE — Progress Notes (Signed)
Palliative: Mrs. Sample is sitting up in the [place] chair in her room.  She greets me making and mostly keeping eye contact.  She is alert and oriented, able to make her needs known.  There is no family at bedside at this time.  We did talk about her revascularization, and anticipated wound care for her left foot.  She shares that she will work with the wound care nurse at [facility] for the treatment plan.  She tells me she is ready to go "home".  No questions or concerns at this time.  Conference with attending related to patient condition, needs, goals of care, disposition.  Plan: Continue to treat the treatable but no CPR or intubation.  Return to [facility] under long-term care.  15 minutes Lillia Carmel, NP Palliative medicine team Team phone [phone] Greater than 50% of this time was spent counseling and coordinating care related to the above assessment and plan.

## 2020-07-01 NOTE — Progress Notes (Signed)
Patient is refusing to take any pm medications. Patient is also refusing for nursing staff to change dressing to leg. Patient has been educated on the importance of medications and dressing change, pt is still refusing after education. Will continue to monitor as needed.

## 2020-07-01 NOTE — Progress Notes (Signed)
[facility] Progress Note     Subjective: Still does not want anything more done to the foot. IR able to get blood flow into foot on Friday. Dressing changes being done.   Objective: Vital signs in last 24 hours: Temp:  [98.6 F (37 C)-98.9 F (37.2 C)] 98.9 F (37.2 C) (01/10 0542) Pulse Rate:  [60-73] 72 (01/10 0542) Resp:  [17-19] 17 (01/10 0542) BP: (107-124)/(53-69) 124/59 (01/10 0542) SpO2:  [99 %-100 %] 100 % (01/10 0542) Weight:  [73.1 kg] 73.1 kg (01/10 0542) Last BM Date: 07/01/20  Intake/Output from previous day: 01/09 0701 - 01/10 0700 In: 460 [P.O.:360; IV Piggyback:100] Out: 2265 [Urine:2265] Intake/Output this shift: Total I/O In: 120 [P.O.:120] Out: -   General appearance: alert and no distress Extremities: left foot plantar surface ulcer with continued fibrinous material lateral, some granulation starting medial, santyl and dressing replaced  Lab Results:  Recent Labs    06/30/20 0646 07/01/20 0635  WBC 6.0 4.7  HGB 11.3* 10.3*  HCT 37.3 33.2*  PLT 271 265   BMET Recent Labs    06/30/20 0646 07/01/20 0635  NA 131* 132*  K 4.2 3.9  CL 92* 90*  CO2 28 30  GLUCOSE 338* 246*  BUN 23 25*  CREATININE 0.87 0.81  CALCIUM 8.8* 8.4*   PT/INR No results for input(s): LABPROT, INR in the last 72 hours.  Studies/Results: IR Angiogram Extremity Left  Result Date: 07/01/2020 INDICATION: 85 year old female with left lower extremity wound dehiscence and PA D presents for angiogram and possible intervention EXAM: ULTRASOUND-GUIDED ACCESS RIGHT COMMON FEMORAL ARTERY PELVIC AND LEFT LOWER EXTREMITY ANGIOGRAM LASER ATHERECTOMY LEFT SFA AND LEFT PERONEAL ARTERY DRUG-ELUTING BALLOON ANGIOPLASTY LEFT SFA BALLOON ANGIOPLASTY LEFT PERONEAL ARTERY MEDICATIONS: 8000 units IV heparin. ANESTHESIA/SEDATION: Moderate (conscious) sedation was employed during this procedure. A total of Versed 3.0 mg and Fentanyl 150 mcg was administered intravenously.  Moderate Sedation Time: 310 minutes. The patient's level of consciousness and vital signs were monitored continuously by radiology nursing throughout the procedure under my direct supervision. CONTRAST:  141 CC CONTRAST FLUOROSCOPY TIME:  Fluoroscopy Time: 28 minutes 0 seconds (104.8 mGy). COMPLICATIONS: None PROCEDURE: Informed consent was obtained from the patient following explanation of the procedure, risks, benefits and alternatives. The patient understands, agrees and consents for the procedure. All questions were addressed. A time out was performed prior to the initiation of the procedure. Maximal barrier sterile technique utilized including caps, mask, sterile gowns, sterile gloves, large sterile drape, hand hygiene, and Betadine prep. Ultrasound survey of the right inguinal region was performed with images stored and sent to PACs, confirming patency of the vessel. 1% lidocaine was used for local anesthesia. Small stab incision was made. Blunt dissection was performed. A micropuncture needle was used access the right common femoral artery under ultrasound. With excellent arterial blood flow returned, and an .018 micro wire was passed through the needle, observed enter the abdominal aorta under fluoroscopy. The needle was removed, and a micropuncture sheath was placed over the wire. The inner dilator and wire were removed, and an 035 Bentson wire was advanced under fluoroscopy into the abdominal aorta. The sheath was removed and a standard 5 [place] vascular sheath was placed. The dilator was removed and the sheath was flushed. Omni Flush catheter was advanced to the lower abdominal aorta. Angiogram was performed. Bentson wire was navigated into the left iliac system with the use of the Omni Flush catheter. Omni Flush catheter was removed and a 035 quick cross  crossing catheter was advanced to the left common femoral artery. Angiogram of the left lower extremity was performed. Rose in wire was then placed  through the crossing catheter and sheath exchange was made for a 6 French 45 cm straight tip to room 0 destination sheath. The sheath was position in the proximal superficial femoral artery, and the introducer and wire were removed. Given the disease segment of the peroneal artery contributing to the dorsalis pedis, we elected to use ultrasound-guided puncture distally for a retrograde approach. 1% lidocaine was used for local anesthesia. Ultrasound-guided access of the left dorsalis pedis was then performed with a V18 wire advanced through the needle retrograde. The wire was advanced to the proximal peroneal artery with the assistance of 018 crossing catheter. The wire and catheter were in a subintimal channel within the proximal peroneal artery. Catheter was withdrawn. A whisper wire was then used in attempt to find a luminal channel within the mid segment of the peroneal artery after withdrawal of the crossing catheter. We then used a Glidewire and an angled quick cross crossing catheter through the 6 [place] sheath. The catheter was navigated to the origin of the peroneal artery. 12 g crossing wire was then passed through the angle crossing catheter antegrade through the peroneal artery with a distal position achieved. 2 mm balloon angioplasty was then performed in the proximal segment in order to create a connecting channel from the subintimal channel and the lumen of the proximal peroneal artery. We were then successful in navigating the retrograde directed 018 system into the true lumen of the peroneal artery and the tibioperoneal trunk. The quick cross catheter was then advanced retrograde to an angled vertebral catheter. A rendezvous was then performed with retrograde passage of a whisper wire. This wire was then ultimately externalized from both the pedal access and the right femoral sheath access. 014 crossing catheter was then passed distally over the wire. With a distal position achieved in the dorsalis  pedis the wire was removed. Antegrade whisper wire was placed from the top down. Once the whisper wire was in position we initiated laser atherectomy of first the femoral segment, and then the tibial segment. 1.5 mm device was used for both the femoral and the tibial segment. After atherectomy was completed, balloon angioplasty was performed of the tibial segment, with 2.5 mm balloon angioplasty starting distally. We increased the diameter to a 3 mm segment proximally. Given some slow flow through the distal segment, repeat 2 mm balloon angioplasty was performed in the distal peroneal artery crossing the ankle joint into the dorsalis pedis. Drug-eluting balloon angioplasty was then performed in the femoropopliteal segment, with 5 mm x 120 mm. We observed a 3 minutes interval. After treatment of both segments, there was excellent response of the femoral segment with 0 residual stenosis. There was restoration of flow through the previously occluded tibial/peroneal segment into the dorsalis pedis. A non flow limiting dissection was present. We confirmed a palpable pulse at the foot in the dorsalis pedis segment and elected to withdraw at this point. The destination sheath was exchanged for a short 6 French sheath at the right common femoral artery access point. We attempted to deploy a Celt hemostasis device, which ultimately failed and was withdrawn. Manual pressure of 25 minutes was used for hemostasis with a pressure dressing applied. Patient tolerated the procedure well and remained hemodynamically stable throughout. Blood loss was estimated 50 cc. FINDINGS: Ultrasound demonstrates significant calcifications of the right common femoral artery though patent.  Pelvic angiogram demonstrates mild atherosclerosis of the lower abdominal aorta. Bilateral iliac arteries with mild atherosclerosis and no high-grade stenosis or occlusion. Hypogastric arteries patent bilaterally. Bilateral external iliac arteries patent with  mild to moderate calcified plaque. Bilateral common femoral arteries patent. Proximal right SFA and profunda femoris patent. Angiogram left lower extremity demonstrates patent profunda femoris and thigh branches. Moderate to advanced atherosclerotic changes of the left SFA with high-grade 80% stenosis in the adductor canal. Popliteal artery patent without significant stenosis. Posterior tibial artery is patent with mild atherosclerosis. There is a paucity of flow from the posterior tibial artery into the common plantar artery. Variant anatomy of the tibioperoneal trunk with the TP trunk contributing to anterior tibial artery and peroneal artery. Peroneal artery is significantly disease with distal contribution to the dorsalis pedis. Anterior tibial artery is occluded in the mid segment with collateral flow. After treatment of the SFA there is 0% residual stenosis in the abductor canal. After treatment of the tibial segment, target peroneal artery, there is 100% improved perfusion into the dorsalis pedis. Non flow limiting dissection present in the TP trunk and peroneal artery, with a palpable pulse at the ankle. IMPRESSION: Status post ultrasound guided access right common femoral artery and left dorsalis pedis for treatment of multi segment left-sided PAD contributing to poor wound healing, with laser atherectomy of both the femoral segment and occluded left peroneal artery, drug-eluting balloon angioplasty of SFA, and PTA of left peroneal artery restoring palpable dorsalis pedis pulse. Variant anatomy observed, with the TP trunk contributing to peroneal artery and anterior tibial artery, and the peroneal artery terminating in the dorsalis pedis. Signed, Yvone Neu. Reyne Dumas, RPVI [facility] Rad[facility] Electronically Signed   By: Gilmer Mor D.O.   On: 07/01/2020 09:10   IR FEM POP ART ATHERECT INC PTA MOD SED  Result Date: 07/01/2020 INDICATION: 85 year old  female with left lower extremity wound dehiscence and PA D presents for angiogram and possible intervention EXAM: ULTRASOUND-GUIDED ACCESS RIGHT COMMON FEMORAL ARTERY PELVIC AND LEFT LOWER EXTREMITY ANGIOGRAM LASER ATHERECTOMY LEFT SFA AND LEFT PERONEAL ARTERY DRUG-ELUTING BALLOON ANGIOPLASTY LEFT SFA BALLOON ANGIOPLASTY LEFT PERONEAL ARTERY MEDICATIONS: 8000 units IV heparin. ANESTHESIA/SEDATION: Moderate (conscious) sedation was employed during this procedure. A total of Versed 3.0 mg and Fentanyl 150 mcg was administered intravenously. Moderate Sedation Time: 310 minutes. The patient's level of consciousness and vital signs were monitored continuously by radiology nursing throughout the procedure under my direct supervision. CONTRAST:  141 CC CONTRAST FLUOROSCOPY TIME:  Fluoroscopy Time: 28 minutes 0 seconds (104.8 mGy). COMPLICATIONS: None PROCEDURE: Informed consent was obtained from the patient following explanation of the procedure, risks, benefits and alternatives. The patient understands, agrees and consents for the procedure. All questions were addressed. A time out was performed prior to the initiation of the procedure. Maximal barrier sterile technique utilized including caps, mask, sterile gowns, sterile gloves, large sterile drape, hand hygiene, and Betadine prep. Ultrasound survey of the right inguinal region was performed with images stored and sent to PACs, confirming patency of the vessel. 1% lidocaine was used for local anesthesia. Small stab incision was made. Blunt dissection was performed. A micropuncture needle was used access the right common femoral artery under ultrasound. With excellent arterial blood flow returned, and an .018 micro wire was passed through the needle, observed enter the abdominal aorta under fluoroscopy. The needle was removed, and a micropuncture sheath was placed over the wire. The inner dilator and wire were removed, and an  035 Bentson wire was advanced under  fluoroscopy into the abdominal aorta. The sheath was removed and a standard 5 [place] vascular sheath was placed. The dilator was removed and the sheath was flushed. Omni Flush catheter was advanced to the lower abdominal aorta. Angiogram was performed. Bentson wire was navigated into the left iliac system with the use of the Omni Flush catheter. Omni Flush catheter was removed and a 035 quick cross crossing catheter was advanced to the left common femoral artery. Angiogram of the left lower extremity was performed. Rose in wire was then placed through the crossing catheter and sheath exchange was made for a 6 French 45 cm straight tip to room 0 destination sheath. The sheath was position in the proximal superficial femoral artery, and the introducer and wire were removed. Given the disease segment of the peroneal artery contributing to the dorsalis pedis, we elected to use ultrasound-guided puncture distally for a retrograde approach. 1% lidocaine was used for local anesthesia. Ultrasound-guided access of the left dorsalis pedis was then performed with a V18 wire advanced through the needle retrograde. The wire was advanced to the proximal peroneal artery with the assistance of 018 crossing catheter. The wire and catheter were in a subintimal channel within the proximal peroneal artery. Catheter was withdrawn. A whisper wire was then used in attempt to find a luminal channel within the mid segment of the peroneal artery after withdrawal of the crossing catheter. We then used a Glidewire and an angled quick cross crossing catheter through the 6 [place] sheath. The catheter was navigated to the origin of the peroneal artery. 12 g crossing wire was then passed through the angle crossing catheter antegrade through the peroneal artery with a distal position achieved. 2 mm balloon angioplasty was then performed in the proximal segment in order to create a connecting channel from the subintimal channel and the lumen of the  proximal peroneal artery. We were then successful in navigating the retrograde directed 018 system into the true lumen of the peroneal artery and the tibioperoneal trunk. The quick cross catheter was then advanced retrograde to an angled vertebral catheter. A rendezvous was then performed with retrograde passage of a whisper wire. This wire was then ultimately externalized from both the pedal access and the right femoral sheath access. 014 crossing catheter was then passed distally over the wire. With a distal position achieved in the dorsalis pedis the wire was removed. Antegrade whisper wire was placed from the top down. Once the whisper wire was in position we initiated laser atherectomy of first the femoral segment, and then the tibial segment. 1.5 mm device was used for both the femoral and the tibial segment. After atherectomy was completed, balloon angioplasty was performed of the tibial segment, with 2.5 mm balloon angioplasty starting distally. We increased the diameter to a 3 mm segment proximally. Given some slow flow through the distal segment, repeat 2 mm balloon angioplasty was performed in the distal peroneal artery crossing the ankle joint into the dorsalis pedis. Drug-eluting balloon angioplasty was then performed in the femoropopliteal segment, with 5 mm x 120 mm. We observed a 3 minutes interval. After treatment of both segments, there was excellent response of the femoral segment with 0 residual stenosis. There was restoration of flow through the previously occluded tibial/peroneal segment into the dorsalis pedis. A non flow limiting dissection was present. We confirmed a palpable pulse at the foot in the dorsalis pedis segment and elected to withdraw at this point. The destination sheath  was exchanged for a short 6 French sheath at the right common femoral artery access point. We attempted to deploy a Celt hemostasis device, which ultimately failed and was withdrawn. Manual pressure of 25  minutes was used for hemostasis with a pressure dressing applied. Patient tolerated the procedure well and remained hemodynamically stable throughout. Blood loss was estimated 50 cc. FINDINGS: Ultrasound demonstrates significant calcifications of the right common femoral artery though patent. Pelvic angiogram demonstrates mild atherosclerosis of the lower abdominal aorta. Bilateral iliac arteries with mild atherosclerosis and no high-grade stenosis or occlusion. Hypogastric arteries patent bilaterally. Bilateral external iliac arteries patent with mild to moderate calcified plaque. Bilateral common femoral arteries patent. Proximal right SFA and profunda femoris patent. Angiogram left lower extremity demonstrates patent profunda femoris and thigh branches. Moderate to advanced atherosclerotic changes of the left SFA with high-grade 80% stenosis in the adductor canal. Popliteal artery patent without significant stenosis. Posterior tibial artery is patent with mild atherosclerosis. There is a paucity of flow from the posterior tibial artery into the common plantar artery. Variant anatomy of the tibioperoneal trunk with the TP trunk contributing to anterior tibial artery and peroneal artery. Peroneal artery is significantly disease with distal contribution to the dorsalis pedis. Anterior tibial artery is occluded in the mid segment with collateral flow. After treatment of the SFA there is 0% residual stenosis in the abductor canal. After treatment of the tibial segment, target peroneal artery, there is 100% improved perfusion into the dorsalis pedis. Non flow limiting dissection present in the TP trunk and peroneal artery, with a palpable pulse at the ankle. IMPRESSION: Status post ultrasound guided access right common femoral artery and left dorsalis pedis for treatment of multi segment left-sided PAD contributing to poor wound healing, with laser atherectomy of both the femoral segment and occluded left peroneal  artery, drug-eluting balloon angioplasty of SFA, and PTA of left peroneal artery restoring palpable dorsalis pedis pulse. Variant anatomy observed, with the TP trunk contributing to peroneal artery and anterior tibial artery, and the peroneal artery terminating in the dorsalis pedis. Signed, Yvone Neu. Reyne Dumas, RPVI [facility]iology Special[facility] Electronically Signed   By: Gilmer Mor D.O.   On: 07/01/2020 09:10   IR US Guide Vasc Access Left  Result Date: 07/01/2020 INDICATION: 85 year old female with left lower extremity wound dehiscence and PA D presents for angiogram and possible intervention EXAM: ULTRASOUND-GUIDED ACCESS RIGHT COMMON FEMORAL ARTERY PELVIC AND LEFT LOWER EXTREMITY ANGIOGRAM LASER ATHERECTOMY LEFT SFA AND LEFT PERONEAL ARTERY DRUG-ELUTING BALLOON ANGIOPLASTY LEFT SFA BALLOON ANGIOPLASTY LEFT PERONEAL ARTERY MEDICATIONS: 8000 units IV heparin. ANESTHESIA/SEDATION: Moderate (conscious) sedation was employed during this procedure. A total of Versed 3.0 mg and Fentanyl 150 mcg was administered intravenously. Moderate Sedation Time: 310 minutes. The patient's level of consciousness and vital signs were monitored continuously by radiology nursing throughout the procedure under my direct supervision. CONTRAST:  141 CC CONTRAST FLUOROSCOPY TIME:  Fluoroscopy Time: 28 minutes 0 seconds (104.8 mGy). COMPLICATIONS: None PROCEDURE: Informed consent was obtained from the patient following explanation of the procedure, risks, benefits and alternatives. The patient understands, agrees and consents for the procedure. All questions were addressed. A time out was performed prior to the initiation of the procedure. Maximal barrier sterile technique utilized including caps, mask, sterile gowns, sterile gloves, large sterile drape, hand hygiene, and Betadine prep. Ultrasound survey of the right inguinal region was performed with images stored and sent to PACs,  confirming patency of the vessel. 1% lidocaine was  used for local anesthesia. Small stab incision was made. Blunt dissection was performed. A micropuncture needle was used access the right common femoral artery under ultrasound. With excellent arterial blood flow returned, and an .018 micro wire was passed through the needle, observed enter the abdominal aorta under fluoroscopy. The needle was removed, and a micropuncture sheath was placed over the wire. The inner dilator and wire were removed, and an 035 Bentson wire was advanced under fluoroscopy into the abdominal aorta. The sheath was removed and a standard 5 [place] vascular sheath was placed. The dilator was removed and the sheath was flushed. Omni Flush catheter was advanced to the lower abdominal aorta. Angiogram was performed. Bentson wire was navigated into the left iliac system with the use of the Omni Flush catheter. Omni Flush catheter was removed and a 035 quick cross crossing catheter was advanced to the left common femoral artery. Angiogram of the left lower extremity was performed. Rose in wire was then placed through the crossing catheter and sheath exchange was made for a 6 French 45 cm straight tip to room 0 destination sheath. The sheath was position in the proximal superficial femoral artery, and the introducer and wire were removed. Given the disease segment of the peroneal artery contributing to the dorsalis pedis, we elected to use ultrasound-guided puncture distally for a retrograde approach. 1% lidocaine was used for local anesthesia. Ultrasound-guided access of the left dorsalis pedis was then performed with a V18 wire advanced through the needle retrograde. The wire was advanced to the proximal peroneal artery with the assistance of 018 crossing catheter. The wire and catheter were in a subintimal channel within the proximal peroneal artery. Catheter was withdrawn. A whisper wire was then used in attempt to find a luminal channel within  the mid segment of the peroneal artery after withdrawal of the crossing catheter. We then used a Glidewire and an angled quick cross crossing catheter through the 6 [place] sheath. The catheter was navigated to the origin of the peroneal artery. 12 g crossing wire was then passed through the angle crossing catheter antegrade through the peroneal artery with a distal position achieved. 2 mm balloon angioplasty was then performed in the proximal segment in order to create a connecting channel from the subintimal channel and the lumen of the proximal peroneal artery. We were then successful in navigating the retrograde directed 018 system into the true lumen of the peroneal artery and the tibioperoneal trunk. The quick cross catheter was then advanced retrograde to an angled vertebral catheter. A rendezvous was then performed with retrograde passage of a whisper wire. This wire was then ultimately externalized from both the pedal access and the right femoral sheath access. 014 crossing catheter was then passed distally over the wire. With a distal position achieved in the dorsalis pedis the wire was removed. Antegrade whisper wire was placed from the top down. Once the whisper wire was in position we initiated laser atherectomy of first the femoral segment, and then the tibial segment. 1.5 mm device was used for both the femoral and the tibial segment. After atherectomy was completed, balloon angioplasty was performed of the tibial segment, with 2.5 mm balloon angioplasty starting distally. We increased the diameter to a 3 mm segment proximally. Given some slow flow through the distal segment, repeat 2 mm balloon angioplasty was performed in the distal peroneal artery crossing the ankle joint into the dorsalis pedis. Drug-eluting balloon angioplasty was then performed in the femoropopliteal segment, with 5 mm x  120 mm. We observed a 3 minutes interval. After treatment of both segments, there was excellent response of  the femoral segment with 0 residual stenosis. There was restoration of flow through the previously occluded tibial/peroneal segment into the dorsalis pedis. A non flow limiting dissection was present. We confirmed a palpable pulse at the foot in the dorsalis pedis segment and elected to withdraw at this point. The destination sheath was exchanged for a short 6 French sheath at the right common femoral artery access point. We attempted to deploy a Celt hemostasis device, which ultimately failed and was withdrawn. Manual pressure of 25 minutes was used for hemostasis with a pressure dressing applied. Patient tolerated the procedure well and remained hemodynamically stable throughout. Blood loss was estimated 50 cc. FINDINGS: Ultrasound demonstrates significant calcifications of the right common femoral artery though patent. Pelvic angiogram demonstrates mild atherosclerosis of the lower abdominal aorta. Bilateral iliac arteries with mild atherosclerosis and no high-grade stenosis or occlusion. Hypogastric arteries patent bilaterally. Bilateral external iliac arteries patent with mild to moderate calcified plaque. Bilateral common femoral arteries patent. Proximal right SFA and profunda femoris patent. Angiogram left lower extremity demonstrates patent profunda femoris and thigh branches. Moderate to advanced atherosclerotic changes of the left SFA with high-grade 80% stenosis in the adductor canal. Popliteal artery patent without significant stenosis. Posterior tibial artery is patent with mild atherosclerosis. There is a paucity of flow from the posterior tibial artery into the common plantar artery. Variant anatomy of the tibioperoneal trunk with the TP trunk contributing to anterior tibial artery and peroneal artery. Peroneal artery is significantly disease with distal contribution to the dorsalis pedis. Anterior tibial artery is occluded in the mid segment with collateral flow. After treatment of the SFA there is  0% residual stenosis in the abductor canal. After treatment of the tibial segment, target peroneal artery, there is 100% improved perfusion into the dorsalis pedis. Non flow limiting dissection present in the TP trunk and peroneal artery, with a palpable pulse at the ankle. IMPRESSION: Status post ultrasound guided access right common femoral artery and left dorsalis pedis for treatment of multi segment left-sided PAD contributing to poor wound healing, with laser atherectomy of both the femoral segment and occluded left peroneal artery, drug-eluting balloon angioplasty of SFA, and PTA of left peroneal artery restoring palpable dorsalis pedis pulse. Variant anatomy observed, with the TP trunk contributing to peroneal artery and anterior tibial artery, and the peroneal artery terminating in the dorsalis pedis. Signed, Yvone Neu. Reyne Dumas, RPVI [facility] Radiology Electronically Signed   By: Gilmer Mor D.O.   On: 07/01/2020 09:10   IR US Guide Vasc Access Right  Result Date: 07/01/2020 INDICATION: 85 year old female with left lower extremity wound dehiscence and PA D presents for angiogram and possible intervention EXAM: ULTRASOUND-GUIDED ACCESS RIGHT COMMON FEMORAL ARTERY PELVIC AND LEFT LOWER EXTREMITY ANGIOGRAM LASER ATHERECTOMY LEFT SFA AND LEFT PERONEAL ARTERY DRUG-ELUTING BALLOON ANGIOPLASTY LEFT SFA BALLOON ANGIOPLASTY LEFT PERONEAL ARTERY MEDICATIONS: 8000 units IV heparin. ANESTHESIA/SEDATION: Moderate (conscious) sedation was employed during this procedure. A total of Versed 3.0 mg and Fentanyl 150 mcg was administered intravenously. Moderate Sedation Time: 310 minutes. The patient's level of consciousness and vital signs were monitored continuously by radiology nursing throughout the procedure under my direct supervision. CONTRAST:  141 CC CONTRAST FLUOROSCOPY TIME:  Fluoroscopy Time: 28 minutes 0 seconds (104.8 mGy). COMPLICATIONS: None PROCEDURE:  Informed consent was obtained from the patient following explanation of the procedure, risks, benefits and  alternatives. The patient understands, agrees and consents for the procedure. All questions were addressed. A time out was performed prior to the initiation of the procedure. Maximal barrier sterile technique utilized including caps, mask, sterile gowns, sterile gloves, large sterile drape, hand hygiene, and Betadine prep. Ultrasound survey of the right inguinal region was performed with images stored and sent to PACs, confirming patency of the vessel. 1% lidocaine was used for local anesthesia. Small stab incision was made. Blunt dissection was performed. A micropuncture needle was used access the right common femoral artery under ultrasound. With excellent arterial blood flow returned, and an .018 micro wire was passed through the needle, observed enter the abdominal aorta under fluoroscopy. The needle was removed, and a micropuncture sheath was placed over the wire. The inner dilator and wire were removed, and an 035 Bentson wire was advanced under fluoroscopy into the abdominal aorta. The sheath was removed and a standard 5 [place] vascular sheath was placed. The dilator was removed and the sheath was flushed. Omni Flush catheter was advanced to the lower abdominal aorta. Angiogram was performed. Bentson wire was navigated into the left iliac system with the use of the Omni Flush catheter. Omni Flush catheter was removed and a 035 quick cross crossing catheter was advanced to the left common femoral artery. Angiogram of the left lower extremity was performed. Rose in wire was then placed through the crossing catheter and sheath exchange was made for a 6 French 45 cm straight tip to room 0 destination sheath. The sheath was position in the proximal superficial femoral artery, and the introducer and wire were removed. Given the disease segment of the peroneal artery contributing to the dorsalis pedis, we  elected to use ultrasound-guided puncture distally for a retrograde approach. 1% lidocaine was used for local anesthesia. Ultrasound-guided access of the left dorsalis pedis was then performed with a V18 wire advanced through the needle retrograde. The wire was advanced to the proximal peroneal artery with the assistance of 018 crossing catheter. The wire and catheter were in a subintimal channel within the proximal peroneal artery. Catheter was withdrawn. A whisper wire was then used in attempt to find a luminal channel within the mid segment of the peroneal artery after withdrawal of the crossing catheter. We then used a Glidewire and an angled quick cross crossing catheter through the 6 [place] sheath. The catheter was navigated to the origin of the peroneal artery. 12 g crossing wire was then passed through the angle crossing catheter antegrade through the peroneal artery with a distal position achieved. 2 mm balloon angioplasty was then performed in the proximal segment in order to create a connecting channel from the subintimal channel and the lumen of the proximal peroneal artery. We were then successful in navigating the retrograde directed 018 system into the true lumen of the peroneal artery and the tibioperoneal trunk. The quick cross catheter was then advanced retrograde to an angled vertebral catheter. A rendezvous was then performed with retrograde passage of a whisper wire. This wire was then ultimately externalized from both the pedal access and the right femoral sheath access. 014 crossing catheter was then passed distally over the wire. With a distal position achieved in the dorsalis pedis the wire was removed. Antegrade whisper wire was placed from the top down. Once the whisper wire was in position we initiated laser atherectomy of first the femoral segment, and then the tibial segment. 1.5 mm device was used for both the femoral and the tibial segment. After  atherectomy was completed, balloon  angioplasty was performed of the tibial segment, with 2.5 mm balloon angioplasty starting distally. We increased the diameter to a 3 mm segment proximally. Given some slow flow through the distal segment, repeat 2 mm balloon angioplasty was performed in the distal peroneal artery crossing the ankle joint into the dorsalis pedis. Drug-eluting balloon angioplasty was then performed in the femoropopliteal segment, with 5 mm x 120 mm. We observed a 3 minutes interval. After treatment of both segments, there was excellent response of the femoral segment with 0 residual stenosis. There was restoration of flow through the previously occluded tibial/peroneal segment into the dorsalis pedis. A non flow limiting dissection was present. We confirmed a palpable pulse at the foot in the dorsalis pedis segment and elected to withdraw at this point. The destination sheath was exchanged for a short 6 French sheath at the right common femoral artery access point. We attempted to deploy a Celt hemostasis device, which ultimately failed and was withdrawn. Manual pressure of 25 minutes was used for hemostasis with a pressure dressing applied. Patient tolerated the procedure well and remained hemodynamically stable throughout. Blood loss was estimated 50 cc. FINDINGS: Ultrasound demonstrates significant calcifications of the right common femoral artery though patent. Pelvic angiogram demonstrates mild atherosclerosis of the lower abdominal aorta. Bilateral iliac arteries with mild atherosclerosis and no high-grade stenosis or occlusion. Hypogastric arteries patent bilaterally. Bilateral external iliac arteries patent with mild to moderate calcified plaque. Bilateral common femoral arteries patent. Proximal right SFA and profunda femoris patent. Angiogram left lower extremity demonstrates patent profunda femoris and thigh branches. Moderate to advanced atherosclerotic changes of the left SFA with high-grade 80% stenosis in the adductor  canal. Popliteal artery patent without significant stenosis. Posterior tibial artery is patent with mild atherosclerosis. There is a paucity of flow from the posterior tibial artery into the common plantar artery. Variant anatomy of the tibioperoneal trunk with the TP trunk contributing to anterior tibial artery and peroneal artery. Peroneal artery is significantly disease with distal contribution to the dorsalis pedis. Anterior tibial artery is occluded in the mid segment with collateral flow. After treatment of the SFA there is 0% residual stenosis in the abductor canal. After treatment of the tibial segment, target peroneal artery, there is 100% improved perfusion into the dorsalis pedis. Non flow limiting dissection present in the TP trunk and peroneal artery, with a palpable pulse at the ankle. IMPRESSION: Status post ultrasound guided access right common femoral artery and left dorsalis pedis for treatment of multi segment left-sided PAD contributing to poor wound healing, with laser atherectomy of both the femoral segment and occluded left peroneal artery, drug-eluting balloon angioplasty of SFA, and PTA of left peroneal artery restoring palpable dorsalis pedis pulse. Variant anatomy observed, with the TP trunk contributing to peroneal artery and anterior tibial artery, and the peroneal artery terminating in the dorsalis pedis. Signed, Yvone Neu. Reyne Dumas, RPVI Vascular and Interventional Radiology Specialists [facility] Radiology Electronically Signed   By: Gilmer Mor D.O.   On: 07/01/2020 09:10    Anti-infectives: Anti-infectives (From admission, onward)   Start     Dose/Rate Route Frequency Ordered Stop   06/26/20 1600  vancomycin (VANCOREADY) IVPB 750 mg/150 mL  Status:  Discontinued        750 mg 150 mL/hr over 60 Minutes Intravenous Every 24 hours 06/25/20 1458 06/28/20 1026   06/25/20 1600  cefTRIAXone (ROCEPHIN) 2 g in sodium chloride 0.9 % 100 mL IVPB  2 g 200 mL/hr over 30  Minutes Intravenous Every 24 hours 06/25/20 1449     06/25/20 1415  vancomycin (VANCOREADY) IVPB 1500 mg/300 mL        1,500 mg 150 mL/hr over 120 Minutes Intravenous  Once 06/25/20 1356 06/25/20 1721   06/25/20 1400  cefTRIAXone (ROCEPHIN) 2 g in sodium chloride 0.9 % 100 mL IVPB  Status:  Discontinued        2 g 200 mL/hr over 30 Minutes Intravenous  Once 06/25/20 1349 06/25/20 1449      Assessment/Plan: Ms. Tara SchoonerUlrich is a 85 yo with a plantar surface ulcer that I did some debridement on at the bedside. Xray with possible osteo. She does not want an amputation and wants to go home. IR was able to revascularize on Friday.  MRI could show if true osteo or not so can tailor antibiotics as needed Continue BID santyl and dressing changes with saline dampened gauze, kerlix Local debridement as tolerated   She refuses any amputation even if there is osteo based on her conversations with me.  COVID test now possibly positive, confirmatory being dong.  Discussed with Dr. Laural BenesJohnson.   LOS: 6 days    Tara Flores 07/01/2020

## 2020-07-01 NOTE — [place] FL2 (Signed)
[place] MEDICAID FL2 LEVEL OF CARE SCREENING TOOL     IDENTIFICATION  Patient Name: Tara Flores Birthdate: 10-Jul-1935 Sex: female Admission Date (Current Location): 06/25/2020  [place] and [place] Number:  Aaron Edelman [id number] M Facility and Address:  [facility],  [address] [place] [address]      Provider Number: [id number]  Attending Physician Name and Address:  Cleora Fleet, MD  Relative Name and Phone Number:  Noemy Hallmon (daughter) Ph: [phone]    Current Level of Care: Hospital Recommended Level of Care: Skilled Nursing Facility Prior Approval Number:    Date Approved/Denied:   PASRR Number:    Discharge Plan: SNF    Current Diagnoses: Patient Active Problem List   Diagnosis Date Noted  . Chronic diastolic CHF (congestive heart failure) (HCC) 06/26/2020  . COPD (chronic obstructive pulmonary disease) (HCC) 06/26/2020  . Overweight (BMI 25.0-29.9) 06/26/2020  . Plantar ulcer of left foot, with unspecified severity (HCC)   . Foot osteomyelitis, left (HCC) 06/25/2020  . Tobacco use disorder 02/10/2017  . Pulmonary emphysema (HCC)   . Acute on chronic diastolic (congestive) heart failure (HCC) 02/09/2017  . Type 2 diabetes mellitus with hypoglycemia (HCC) 02/09/2017  . New onset a-fib (HCC) 11/20/2016  . Atrial fibrillation with RVR (HCC)   . COPD exacerbation (HCC)   . Goals of care, counseling/discussion   . Palliative care by specialist   . TIA (transient ischemic attack) 11/19/2016  . Diabetic foot ulcer (HCC) 10/10/2016  . Hypokalemia 10/10/2016  . Anemia due to chronic kidney disease 10/10/2016  . Cellulitis in diabetic foot (HCC) 10/09/2016  . Venous ulcer of left leg (HCC) 07/28/2016  . Dry skin dermatitis 07/28/2016  . Type 2 diabetes mellitus with hyperglycemia (HCC) 02/06/2015  . Hip fracture (HCC) 02/05/2015  . Closed right hip fracture (HCC) 02/05/2015  . Abnormal EKG 02/05/2015  . Tobacco abuse 02/05/2015  .  DM2 (diabetes mellitus, type 2) (HCC) 02/05/2015  . Essential hypertension 02/05/2015  . Hypothyroidism 02/05/2015  . Coronary artery disease involving native coronary artery of native heart without angina pectoris     Orientation RESPIRATION BLADDER Height & Weight     Self,Time,Situation,Place  Normal External catheter Weight: 161 lb 2.5 oz (73.1 kg) Height:  5\' 2"  (157.5 cm)  BEHAVIORAL SYMPTOMS/MOOD NEUROLOGICAL BOWEL NUTRITION STATUS      Continent Diet (Heart healthy/carb modified. See d/c summary for updates.)  AMBULATORY STATUS COMMUNICATION OF NEEDS Skin   Extensive Assist Verbally Surgical wounds,Other (Comment) (puncture wound right groin)                       Personal Care Assistance Level of Assistance  Bathing,Feeding,Dressing Bathing Assistance: Maximum assistance Feeding assistance: Independent Dressing Assistance: Maximum assistance     Functional Limitations Info  Sight,Hearing,Speech Sight Info: Adequate Hearing Info: Adequate Speech Info: Adequate    SPECIAL CARE FACTORS FREQUENCY                       Contractures      Additional Factors Info  Code Status,Allergies,Psychotropic,Insulin Sliding Scale Code Status Info: DNR Allergies Info: Avelox (Moxifloxacin); Brethine (Terbutaline); Keflex (Cephalexin) Psychotropic Info: Xanax Insulin Sliding Scale Info: See discharge summary       Current Medications (07/01/2020):  This is the current hospital active medication list Current Facility-Administered Medications  Medication Dose Route Frequency Provider Last Rate Last Admin  . (feeding supplement) PROSource Plus liquid 30 mL  30 mL Oral  Daily Sherryll Burger, Pratik D, DO   30 mL at 06/30/20 0944  . 0.9 %  sodium chloride infusion  250 mL Intravenous PRN Sherryll Burger, Pratik D, DO      . acetaminophen (TYLENOL) tablet 650 mg  650 mg Oral Q6H PRN Maurilio Lovely D, DO   650 mg at 06/30/20 2114   Or  . acetaminophen (TYLENOL) suppository 650 mg  650 mg Rectal  Q6H PRN Sherryll Burger, Pratik D, DO      . ALPRAZolam Prudy Feeler) tablet 0.25 mg  0.25 mg Oral BID PRN Maurilio Lovely D, DO   0.25 mg at 06/30/20 2114  . aspirin chewable tablet 81 mg  81 mg Oral Daily Loyce Dys Sue-Ellen, PA   81 mg at 06/30/20 0943  . bisoprolol (ZEBETA) tablet 5 mg  5 mg Oral Daily Sherryll Burger, Pratik D, DO   5 mg at 06/30/20 0944  . calcium-vitamin D (OSCAL WITH D) 500-200 MG-UNIT per tablet 1 tablet  1 tablet Oral Daily Sherryll Burger, Pratik D, DO   1 tablet at 06/30/20 0944  . cefTRIAXone (ROCEPHIN) 2 g in sodium chloride 0.9 % 100 mL IVPB  2 g Intravenous Q24H Sherryll Burger, Pratik D, DO 200 mL/hr at 06/30/20 1702 2 g at 06/30/20 1702  . Chlorhexidine Gluconate Cloth 2 % PADS 6 each  6 each Topical Daily Cleora Fleet, MD   6 each at 06/30/20 0954  . clopidogrel (PLAVIX) tablet 75 mg  75 mg Oral Daily Loyce Dys Sue-Ellen, PA   75 mg at 06/30/20 0944  . collagenase (SANTYL) ointment   Topical BID Lucretia Roers, MD   Given at 07/01/20 [phone]  . diltiazem (TIAZAC) 24 hr capsule 120 mg  120 mg Oral Daily Sherryll Burger, Pratik D, DO   120 mg at 06/30/20 0944  . enoxaparin (LOVENOX) injection 70 mg  70 mg Subcutaneous Q12H Sherryll Burger, Pratik D, DO   70 mg at 07/01/20 0545  . furosemide (LASIX) tablet 60 mg  60 mg Oral BID Maurilio Lovely D, DO   60 mg at 06/30/20 1704  . insulin aspart (novoLOG) injection 0-5 Units  0-5 Units Subcutaneous QHS Maurilio Lovely D, DO   3 Units at 06/30/20 2114  . insulin aspart (novoLOG) injection 0-9 Units  0-9 Units Subcutaneous TID WC Shah, Pratik D, DO   3 Units at 06/30/20 1700  . insulin NPH Human (NOVOLIN N) injection 14 Units  14 Units Subcutaneous BID AC & HS Hollice Espy, MD   14 Units at 06/30/20 2248  . isosorbide mononitrate (IMDUR) 24 hr tablet 15 mg  15 mg Oral Daily Sherryll Burger, Pratik D, DO   15 mg at 06/30/20 0943  . levalbuterol (XOPENEX) nebulizer solution 0.63 mg  0.63 mg Inhalation Q6H PRN Sherryll Burger, Pratik D, DO   0.63 mg at 06/29/20 1049  . levothyroxine (SYNTHROID) tablet  137 mcg  137 mcg Oral Daily Maurilio Lovely D, DO   137 mcg at 07/01/20 0546  . multivitamin with minerals tablet 1 tablet  1 tablet Oral Daily Maurilio Lovely D, DO   1 tablet at 06/30/20 0943  . nicotine (NICODERM CQ - dosed in mg/24 hr) patch 7 mg  7 mg Transdermal Daily Sherryll Burger, Pratik D, DO   7 mg at 06/30/20 0953  . nitroGLYCERIN (NITROSTAT) SL tablet 0.4 mg  0.4 mg Sublingual Q5 min PRN Sherryll Burger, Pratik D, DO      . ondansetron (ZOFRAN) tablet 4 mg  4 mg Oral Q6H PRN Sherryll Burger, Pratik D, DO  Or  . ondansetron (ZOFRAN) injection 4 mg  4 mg Intravenous Q6H PRN Sherryll Burger, Pratik D, DO      . pravastatin (PRAVACHOL) tablet 20 mg  20 mg Oral Daily Sherryll Burger, Pratik D, DO   20 mg at 06/30/20 0943  . sodium chloride flush (NS) 0.9 % injection 3 mL  3 mL Intravenous Q12H Shah, Pratik D, DO   3 mL at 06/30/20 2116  . sodium chloride flush (NS) 0.9 % injection 3 mL  3 mL Intravenous PRN Sherryll Burger, Pratik D, DO      . spironolactone (ALDACTONE) tablet 12.5 mg  12.5 mg Oral BID Sherryll Burger, Pratik D, DO   12.5 mg at 06/30/20 2114  . zinc sulfate capsule 220 mg  220 mg Oral Daily Sherryll Burger, Pratik D, DO   220 mg at 06/30/20 3244     Discharge Medications: Please see discharge summary for a list of discharge medications.  Relevant Imaging Results:  Relevant Lab Results:   Additional Information SSN: [id number]  Karn Cassis, LCSW

## 2020-07-02 ENCOUNTER — Inpatient Hospital Stay: Payer: Self-pay

## 2020-07-02 ENCOUNTER — Encounter (HOSPITAL_COMMUNITY): Payer: Self-pay

## 2020-07-02 DIAGNOSIS — F17200 Nicotine dependence, unspecified, uncomplicated: Secondary | ICD-10-CM | POA: Diagnosis not present

## 2020-07-02 DIAGNOSIS — J449 Chronic obstructive pulmonary disease, unspecified: Secondary | ICD-10-CM

## 2020-07-02 DIAGNOSIS — M869 Osteomyelitis, unspecified: Secondary | ICD-10-CM | POA: Diagnosis not present

## 2020-07-02 DIAGNOSIS — I5032 Chronic diastolic (congestive) heart failure: Secondary | ICD-10-CM | POA: Diagnosis not present

## 2020-07-02 DIAGNOSIS — Z515 Encounter for palliative care: Secondary | ICD-10-CM | POA: Diagnosis not present

## 2020-07-02 DIAGNOSIS — Z7189 Other specified counseling: Secondary | ICD-10-CM | POA: Diagnosis not present

## 2020-07-02 DIAGNOSIS — M868X7 Other osteomyelitis, ankle and foot: Secondary | ICD-10-CM | POA: Diagnosis not present

## 2020-07-02 DIAGNOSIS — I4891 Unspecified atrial fibrillation: Secondary | ICD-10-CM | POA: Diagnosis not present

## 2020-07-02 LAB — GLUCOSE, CAPILLARY
Glucose-Capillary: 363 mg/dL — ABNORMAL HIGH (ref 70–99)
Glucose-Capillary: 377 mg/dL — ABNORMAL HIGH (ref 70–99)
Glucose-Capillary: 205 mg/dL — ABNORMAL HIGH (ref 70–99)
Glucose-Capillary: 137 mg/dL — ABNORMAL HIGH (ref 70–99)

## 2020-07-02 MED ORDER — VANCOMYCIN HCL IN DEXTROSE 1-5 GM/200ML-% IV SOLN: 1000.0000 mg | INTRAVENOUS | Status: DC

## 2020-07-02 MED ORDER — COLLAGENASE 250 UNIT/GM EX OINT: TOPICAL_OINTMENT | Freq: Two times a day (BID) | CUTANEOUS | 0 refills | Status: AC

## 2020-07-02 MED ORDER — INSULIN NPH (HUMAN) (ISOPHANE) 100 UNIT/ML ~~LOC~~ SUSP
30.0000 [IU] | Freq: Two times a day (BID) | SUBCUTANEOUS | Status: DC
  Administered 2020-07-02: 30 [IU] via SUBCUTANEOUS
  Filled 2020-07-02: qty 10

## 2020-07-02 MED ORDER — CLOPIDOGREL BISULFATE 75 MG PO TABS: 75.0000 mg | ORAL_TABLET | Freq: Every day | ORAL | Status: DC

## 2020-07-02 MED ORDER — VANCOMYCIN IV (FOR PTA / DISCHARGE USE ONLY): 1000.0000 mg | INTRAVENOUS | 0 refills | Status: AC

## 2020-07-02 MED ORDER — VANCOMYCIN HCL 1750 MG/350ML IV SOLN
1750.0000 mg | Freq: Once | INTRAVENOUS | Status: AC
  Administered 2020-07-02: 1750 mg via INTRAVENOUS
  Filled 2020-07-02: qty 350

## 2020-07-02 MED ORDER — CEFTRIAXONE IV (FOR PTA / DISCHARGE USE ONLY): 2.0000 g | INTRAVENOUS | 0 refills | Status: AC

## 2020-07-02 NOTE — Progress Notes (Signed)
[facility]  MRI with osteo as suspected. Patient does not want further amputation. Would recommend antibiotics for osteo and dressing changes with santyl Bid.    Patient now COVID positive so placement pending.  Discussed with Dr. Kerry Hough.   Algis Greenhouse, MD [facility] [address] [place], [place] [address] [phone] (office)

## 2020-07-02 NOTE — Progress Notes (Signed)
Palliative:  Tara Flores is sitting up in a Geri chair in her room.  She greets me making and mostly keeping eye contact.  She appears acutely/chronically ill and somewhat frail.  She is alert and oriented, but confused at times.  She is able to make her needs known.  There is no family at bedside at this time.  She will confabulate a story that seems to go between this hospitalization and her time at her SNF.  We talked about her left foot wound, osteomyelitis.  Tara Flores able to tell me some of what has happened, but seems confused at times.  She is able to independently tell me that she has returned blood flow to her left foot, and that surgery is offering an above-the-knee amputation if needed.  She continues to state that her goal is to save her foot.  We talked about some "what if's and may be's".  I encouraged Tara Flores to consider her choices, sharing that we will respect her decisions.  Call to daughter, Jashiya Bassett.  Left somewhat detailed voicemail message related to patient condition and needs.  Conference with attending, surgeon, transition of care team, bedside nursing staff related to patient condition, needs, goals of care.  Plan:   At this point continue to treat the treatable but no CPR or intubation.  Return to [facility] under [facility]-term care.  Will need IV antibiotics and close monitoring for worsening of osteomyelitis.  Follow-up with surgery outpatient as needed.  Would benefit from outpatient palliative services  40 minutes  Lillia Carmel, NP Palliative Medicine Team  Team Phone  [phone] Greater than 50% of this time was spent counseling and coordinating care related to the above assessment and plan.

## 2020-07-02 NOTE — Progress Notes (Signed)
97% on room air and denies SOB.  Agreed to dressing change. Old dressing to proximal shin area saturated with sanguinous drainage.  Areas look like skin tears.  Plantar wound to left foot cleansed and santyl applied.  Some purulent areas along with bloody drainage and odor.  Assisted back to chair for breakfast.

## 2020-07-02 NOTE — Progress Notes (Signed)
PHARMACY CONSULT NOTE FOR:  OUTPATIENT  PARENTERAL ANTIBIOTIC THERAPY (OPAT)  Indication: osteomyelitis Regimen: Vancomycin 1000 mg IV every 24 hours and Ceftriaxone 2000 mg IV every 24 hours. End date: Last day of therapy for Ceftriaxone: 08/05/2020  Vancomycin: 08/12/2020   IV antibiotic discharge orders are pended. To discharging provider:  please sign these orders via discharge navigator,  Select New Orders & click on the button choice - Manage This Unsigned Work.     Thank you for allowing pharmacy to be a part of this patient's care.  Tad Moore 07/02/2020, 3:09 PM

## 2020-07-02 NOTE — Progress Notes (Deleted)
Physician Discharge Summary  EVONNA STOLTZ AGT:[id number] DOB: 26-Aug-1935 DOA: 06/25/2020  PCP: Hilbert Corrigan, MD  Admit date: 06/25/2020 Discharge date: 07/03/2020  Admitted From: SNF Disposition:  SNF  Recommendations for Outpatient Follow-up:  1. Follow up with PCP in 1-2 weeks 2. Please obtain BMP/CBC in one week 3. Follow-up with general surgery, Dr. Constance Haw as needed for further surgical management of her left foot if desired 4. Palliative care services should follow patient upon return to skilled nursing facility 5. Patient will be discharged with PICC line and long-term IV antibiotics as noted below 6. Follow-up with interventional radiology, Dr. Earleen Newport in 6 weeks 7. Continue dressing changes twice daily  Discharge Condition: Stable CODE STATUS: DNR Diet recommendation: Heart healthy, carb modified  Brief/Interim Summary: ILAISAANE MARTS is a 85 y.o. female with PMH significant for DM2, chronic diastolic CHF, atrial fibrillation, peripheral vascular disease, ongoing tobacco abuse and status post partial left foot transmetatarsal amputation on 4/18 who is a long-term care resident at [facility].  Patient was sent to the ED on 1/4 with grossly worsening redness and swelling in her left foot going up her leg.  In the ED, x-ray of left foot noted osteomyelitis of the left cuboid.   Admitted under hospitalist service.   General surgery was consulted for possible need of amputation.  However patient is not yet clear if she wants to proceed with amputation.  Palliative care consultation was obtained.  1/6, patient underwent bedside debridement by general surgery of left foot stump. 1/6, CT angiogram of aorta and bifemoral showed multilevel peripheral artery disease including multifocal high-grade SFA stenosis bilaterally. 1/7, patient had angiogram done by IR she underwent treatment of left SFA stenosis and left peroneal artery occlusion.  She had drug eluting ballooning of the  left SFA with 0% residual stenting and PTA of the left peroneal artery restoring 100% improved flow.  Discharge Diagnoses:  Principal Problem:   Foot osteomyelitis, left ([place]) Active Problems:   DM2 (diabetes mellitus, type 2) ([place])   Essential hypertension   Hypothyroidism   Anemia due to chronic kidney disease   Atrial fibrillation with RVR (H[facility])   Tobacco use disorder   Chronic diastolic CHF (congestive heart failure) (H[facility])   COPD (chronic obstructive pulmonary disease) (H[facility])   Overweight (BMI 25.0-29.9)   Plantar ulcer of left foot, with unspecified severity ([place])  Left foot osteomyelitis of the cuboid bone History of partial left foot transmetatarsal amputation -General surgery consulted.  Pt adamantly against amputation.  -Currently on IV Rocephin.   MRI confirms osteomyelitis Patient does not want to have surgical management at this time -Discussed with infectious disease recommended 6 weeks of vancomycin and ceftriaxone -PICC line placed for antibiotic therapy -Follow-up with general surgery in the next few weeks if patient reconsiders surgical management -Palliative care to follow patient at skilled nursing facility to further address goals of care  Peripheral vascular disease Ongoing tobacco use -1/7, patient had angiogram done by IR she underwent treatment of left SFA stenosis and left peroneal artery occlusion.  She had drug eluting ballooning of the left SFA with 0% residual stenting and PTA of the left peroneal artery restoring 100% improved flow. -Counseled to quit smoking. -Follow-up with interventional radiology, Dr. Earleen Newport in 6 weeks  Covid Infection -patient does not appear to be symptomatic at this time.  Would continue supportive treatment.   Chronic diastolic CHF Essential hypertension -Currently blood pressure is stable, CHF stable -Home meds include Bisoprolol 5 mg daily,  Cardizem 120 mg daily, Lasix 80 mg twice daily, Imdur 15 mg daily, Aldactone 25  mg twice daily.  Atrial fibrillation -Continue bisoprolol, Cardizem.  Continue anticoagulation with Eliquis.  Type 2 diabetes mellitus -A1c 9.6 on 1/4 -Home meds include Exenatide 10 mcg twice daily, NPH 40 units and 20 units, sliding scale insulin, Jardiance 10 mg daily.    These should be resumed on discharge   Hypothyroidism -Continue Synthroid.  COPD  -Stable.    Anxiety  -Continue Xanax 0.25 mg twice daily,   Discharge Instructions  Discharge Instructions    [facility] pharmacist to adjust dose for Vancomycin, Aminoglycosides and other anti-infective therapies as requested by physician.   Complete by: As directed    [facility] to provide Cath Flo 48m   Complete by: As directed    Administer for PICC line occlusion and as ordered by physician for other access device issues.   Anaphylaxis Kit: Provided to treat any anaphylactic reaction to the medication being provided to the patient if First Dose or when requested by physician   Complete by: As directed    Epinephrine 186mml vial / amp: Administer 0.53m45m0.53ml1mubcutaneously once for moderate to severe anaphylaxis, nurse to call physician and pharmacy when reaction occurs and call 911 if needed for immediate care   Diphenhydramine 50mg7mIV vial: Administer 25-50mg 57mM PRN for first dose reaction, rash, itching, mild reaction, nurse to call physician and pharmacy when reaction occurs   Sodium Chloride 0.9% NS 500ml I47mdminister if needed for hypovolemic blood pressure drop or as ordered by physician after call to physician with anaphylactic reaction   Change dressing on IV access line weekly and PRN   Complete by: As directed    Flush IV access with Sodium Chloride 0.9% and Heparin 10 units/ml or 100 units/ml   Complete by: As directed    Home infusion instructions - [facility]   Complete by: As directed    Instructions: Flush IV access with Sodium Chloride 0.9% and Heparin  10units/ml or 100units/ml   Change dressing on IV access line: Weekly and PRN   Instructions Cath Flo 2mg: Ad105mister for PICC Line occlusion and as ordered by physician for other access device   [facility] pharmacist to adjust dose for: Vancomycin, Aminoglycosides and other anti-infective therapies as requested by physician   Method of administration may be changed at the discretion of home infusion pharmacist based upon assessment of the patient and/or caregiver's ability to self-administer the medication ordered   Complete by: As directed      Allergies as of 07/02/2020      Reactions   Avelox [moxifloxacin]    Brethine [terbutaline]    Made patient confused   Keflex [cephalexin] Nausea Only   Loss of appetite      Medication List    STOP taking these medications   doxycycline 100 MG tablet Commonly known as: VIBRA-TABS   lisinopril 2.5 MG tablet Commonly known as: ZESTRIL     TAKE these medications   acetaminophen 325 MG tablet Commonly known as: TYLENOL Take 650 mg by mouth every 4 (four) hours as needed for mild pain.   ALPRAZolam 0.25 MG tablet Commonly known as: XANAX Take 1 tablet (0.25 mg total) by mouth 2 (two) times daily as needed for anxiety.   apixaban 5 MG Tabs tablet Commonly known as: ELIQUIS Take 1 tablet (5 mg total) by mouth 2 (two) times daily.   BABY SHAMPOO EX Apply  1 application topically daily. Apply to eyelids every morning for irritation   bisoprolol 5 MG tablet Commonly known as: ZEBETA Take 1 tablet (5 mg total) by mouth daily.   Byetta 10 MCG Pen 10 MCG/0.04ML Sopn injection Generic drug: exenatide Inject 10 mcg into the skin in the morning and at bedtime.   Calcium Carb-Cholecalciferol 500-400 MG-UNIT Tabs Take 1 tablet by mouth daily.   cefTRIAXone  IVPB Commonly known as: ROCEPHIN Inject 2 g into the vein daily. Indication:  osteomyelitis First Dose: Yes Last Day of Therapy:  2/14/20222 Labs - Once weekly:  CBC/D  and BMP, Labs - Every other week:  ESR and CRP Method of administration: IV Push Method of administration may be changed at the discretion of home infusion pharmacist based upon assessment of the patient and/or caregiver's ability to self-administer the medication ordered. Start taking on: July 03, 2020   clopidogrel 75 MG tablet Commonly known as: PLAVIX Take 1 tablet (75 mg total) by mouth daily. Start taking on: July 03, 2020   collagenase ointment Commonly known as: SANTYL Apply topically 2 (two) times daily. Start taking on: July 03, 2020   Combivent Respimat 20-100 MCG/ACT Aers respimat Generic drug: Ipratropium-Albuterol   diltiazem 240 MG 24 hr capsule Commonly known as: TIAZAC Take 120 mg by mouth daily.   feeding supplement (PRO-STAT SUGAR FREE 64) Liqd Take 30 mLs by mouth daily.   furosemide 20 MG tablet Commonly known as: Lasix Take 3 tablets (60 mg total) by mouth 2 (two) times daily. What changed: how much to take   insulin lispro 100 UNIT/ML injection Commonly known as: HUMALOG Inject 5-10 Units into the skin 3 (three) times daily as needed. Take 5 units every evening and as needed with meals per sliding scale; sliding scale - blood glucose 71-150 give 0 units, 151-200 give 2 units, 201-250 give 4 units. 251-300 give 6 units, 301-350 give 8 units, and for 351-400 give 10 units and call provider   insulin NPH Human 100 UNIT/ML injection Commonly known as: NOVOLIN N Inject 20-30 Units into the skin 2 (two) times daily. 40units in the morning and 20 units bedtime   isosorbide mononitrate 30 MG 24 hr tablet Commonly known as: IMDUR Take 0.5 tablets (15 mg total) by mouth daily.   Jardiance 10 MG Tabs tablet Generic drug: empagliflozin Take 10 mg by mouth daily.   multivitamin with minerals Tabs tablet Take 1 tablet by mouth daily.   nitroGLYCERIN 0.4 MG SL tablet Commonly known as: NITROSTAT Place 1 tablet (0.4 mg total) under the tongue every  5 (five) minutes as needed for chest pain.   pravastatin 20 MG tablet Commonly known as: PRAVACHOL Take 20 mg by mouth daily.   ProAir HFA 108 (90 Base) MCG/ACT inhaler Generic drug: albuterol   spironolactone 25 MG tablet Commonly known as: ALDACTONE Take 0.5 tablets (12.5 mg total) by mouth 2 (two) times daily. What changed: how much to take   Synthroid 137 MCG tablet Generic drug: levothyroxine Take 137 mcg by mouth daily.   vancomycin  IVPB Inject 1,000 mg into the vein daily. Indication:  osteomyelitis First Dose: Yes Last Day of Therapy:  08/12/2020 Labs - Sunday/Monday:  CBC/D, BMP, and vancomycin trough. Labs - Thursday:  BMP and vancomycin trough Labs - Every other week:  ESR and CRP Method of administration:Elastomeric Method of administration may be changed at the discretion of the patient and/or caregiver's ability to self-administer the medication ordered. Start taking on: July 03, 2020   VITAMIN C PO Take 500 mg by mouth 2 (two) times daily.   zinc sulfate 220 (50 Zn) MG capsule Take 220 mg by mouth daily.            Discharge Care Instructions  (From admission, onward)         Start     Ordered   07/02/20 0000  Change dressing on IV access line weekly and PRN  (Home infusion instructions - [facility] )        07/02/20 2107          Allergies  Allergen Reactions  . Avelox [Moxifloxacin]   . Brethine [Terbutaline]     Made patient confused  . Keflex [Cephalexin] Nausea Only    Loss of appetite    Consultations:  Interventional radiology  General surgery  Palliative care   Procedures/Studies: IR Angiogram Extremity Left  Result Date: 07/01/2020 INDICATION: 85 year old female with left lower extremity wound dehiscence and PA D presents for angiogram and possible intervention EXAM: ULTRASOUND-GUIDED ACCESS RIGHT COMMON FEMORAL ARTERY PELVIC AND LEFT LOWER EXTREMITY ANGIOGRAM LASER ATHERECTOMY LEFT SFA AND LEFT PERONEAL  ARTERY DRUG-ELUTING BALLOON ANGIOPLASTY LEFT SFA BALLOON ANGIOPLASTY LEFT PERONEAL ARTERY MEDICATIONS: 8000 units IV heparin. ANESTHESIA/SEDATION: Moderate (conscious) sedation was employed during this procedure. A total of Versed 3.0 mg and Fentanyl 150 mcg was administered intravenously. Moderate Sedation Time: 310 minutes. The patient's level of consciousness and vital signs were monitored continuously by radiology nursing throughout the procedure under my direct supervision. CONTRAST:  141 CC CONTRAST FLUOROSCOPY TIME:  Fluoroscopy Time: 28 minutes 0 seconds (104.8 mGy). COMPLICATIONS: None PROCEDURE: Informed consent was obtained from the patient following explanation of the procedure, risks, benefits and alternatives. The patient understands, agrees and consents for the procedure. All questions were addressed. A time out was performed prior to the initiation of the procedure. Maximal barrier sterile technique utilized including caps, mask, sterile gowns, sterile gloves, large sterile drape, hand hygiene, and Betadine prep. Ultrasound survey of the right inguinal region was performed with images stored and sent to PACs, confirming patency of the vessel. 1% lidocaine was used for local anesthesia. Small stab incision was made. Blunt dissection was performed. A micropuncture needle was used access the right common femoral artery under ultrasound. With excellent arterial blood flow returned, and an .018 micro wire was passed through the needle, observed enter the abdominal aorta under fluoroscopy. The needle was removed, and a micropuncture sheath was placed over the wire. The inner dilator and wire were removed, and an 035 Bentson wire was advanced under fluoroscopy into the abdominal aorta. The sheath was removed and a standard 5 [place] vascular sheath was placed. The dilator was removed and the sheath was flushed. Omni Flush catheter was advanced to the lower abdominal aorta. Angiogram was performed. Bentson  wire was navigated into the left iliac system with the use of the Omni Flush catheter. Omni Flush catheter was removed and a 035 quick cross crossing catheter was advanced to the left common femoral artery. Angiogram of the left lower extremity was performed. Rose in wire was then placed through the crossing catheter and sheath exchange was made for a 6 French 45 cm straight tip to room 0 destination sheath. The sheath was position in the proximal superficial femoral artery, and the introducer and wire were removed. Given the disease segment of the peroneal artery contributing to the dorsalis pedis, we elected to use ultrasound-guided puncture distally for a retrograde approach. 1% lidocaine  was used for local anesthesia. Ultrasound-guided access of the left dorsalis pedis was then performed with a V18 wire advanced through the needle retrograde. The wire was advanced to the proximal peroneal artery with the assistance of 018 crossing catheter. The wire and catheter were in a subintimal channel within the proximal peroneal artery. Catheter was withdrawn. A whisper wire was then used in attempt to find a luminal channel within the mid segment of the peroneal artery after withdrawal of the crossing catheter. We then used a Glidewire and an angled quick cross crossing catheter through the 6 [place] sheath. The catheter was navigated to the origin of the peroneal artery. 12 g crossing wire was then passed through the angle crossing catheter antegrade through the peroneal artery with a distal position achieved. 2 mm balloon angioplasty was then performed in the proximal segment in order to create a connecting channel from the subintimal channel and the lumen of the proximal peroneal artery. We were then successful in navigating the retrograde directed 018 system into the true lumen of the peroneal artery and the tibioperoneal trunk. The quick cross catheter was then advanced retrograde to an angled vertebral catheter. A  rendezvous was then performed with retrograde passage of a whisper wire. This wire was then ultimately externalized from both the pedal access and the right femoral sheath access. 014 crossing catheter was then passed distally over the wire. With a distal position achieved in the dorsalis pedis the wire was removed. Antegrade whisper wire was placed from the top down. Once the whisper wire was in position we initiated laser atherectomy of first the femoral segment, and then the tibial segment. 1.5 mm device was used for both the femoral and the tibial segment. After atherectomy was completed, balloon angioplasty was performed of the tibial segment, with 2.5 mm balloon angioplasty starting distally. We increased the diameter to a 3 mm segment proximally. Given some slow flow through the distal segment, repeat 2 mm balloon angioplasty was performed in the distal peroneal artery crossing the ankle joint into the dorsalis pedis. Drug-eluting balloon angioplasty was then performed in the femoropopliteal segment, with 5 mm x 120 mm. We observed a 3 minutes interval. After treatment of both segments, there was excellent response of the femoral segment with 0 residual stenosis. There was restoration of flow through the previously occluded tibial/peroneal segment into the dorsalis pedis. A non flow limiting dissection was present. We confirmed a palpable pulse at the foot in the dorsalis pedis segment and elected to withdraw at this point. The destination sheath was exchanged for a short 6 French sheath at the right common femoral artery access point. We attempted to deploy a Celt hemostasis device, which ultimately failed and was withdrawn. Manual pressure of 25 minutes was used for hemostasis with a pressure dressing applied. Patient tolerated the procedure well and remained hemodynamically stable throughout. Blood loss was estimated 50 cc. FINDINGS: Ultrasound demonstrates significant calcifications of the right common  femoral artery though patent. Pelvic angiogram demonstrates mild atherosclerosis of the lower abdominal aorta. Bilateral iliac arteries with mild atherosclerosis and no high-grade stenosis or occlusion. Hypogastric arteries patent bilaterally. Bilateral external iliac arteries patent with mild to moderate calcified plaque. Bilateral common femoral arteries patent. Proximal right SFA and profunda femoris patent. Angiogram left lower extremity demonstrates patent profunda femoris and thigh branches. Moderate to advanced atherosclerotic changes of the left SFA with high-grade 80% stenosis in the adductor canal. Popliteal artery patent without significant stenosis. Posterior tibial artery is patent with  mild atherosclerosis. There is a paucity of flow from the posterior tibial artery into the common plantar artery. Variant anatomy of the tibioperoneal trunk with the TP trunk contributing to anterior tibial artery and peroneal artery. Peroneal artery is significantly disease with distal contribution to the dorsalis pedis. Anterior tibial artery is occluded in the mid segment with collateral flow. After treatment of the SFA there is 0% residual stenosis in the abductor canal. After treatment of the tibial segment, target peroneal artery, there is 100% improved perfusion into the dorsalis pedis. Non flow limiting dissection present in the TP trunk and peroneal artery, with a palpable pulse at the ankle. IMPRESSION: Status post ultrasound guided access right common femoral artery and left dorsalis pedis for treatment of multi segment left-sided PAD contributing to poor wound healing, with laser atherectomy of both the femoral segment and occluded left peroneal artery, drug-eluting balloon angioplasty of SFA, and PTA of left peroneal artery restoring palpable dorsalis pedis pulse. Variant anatomy observed, with the TP trunk contributing to peroneal artery and anterior tibial artery, and the peroneal artery terminating in the  dorsalis pedis. Signed, Dulcy Fanny. Dellia Nims, RPVI [facility] Rad[facility] Electronically Signed   By: Corrie Mckusick D.O.   On: 07/01/2020 09:10   CT ANGIO AO+BIFEM W & OR WO CONTRAST  Result Date: 06/27/2020 CLINICAL DATA:  85 year old female with a history left foot infection EXAM: CT ANGIOGRAPHY OF ABDOMINAL AORTA WITH ILIOFEMORAL RUNOFF TECHNIQUE: Multidetector CT imaging of the abdomen, pelvis and lower extremities was performed using the standard protocol during bolus administration of intravenous contrast. Multiplanar CT image reconstructions and MIPs were obtained to evaluate the vascular anatomy. CONTRAST:  62m OMNIPAQUE IOHEXOL 350 MG/ML SOLN COMPARISON:  Noninvasive 06/25/2020 FINDINGS: VASCULAR Aorta: Atherosclerotic changes of the lower thoracic aorta. Diameter of the aorta at the hiatus measures 2 cm. No dissection. No ulcerated plaque or pedunculated plaque. No periaortic inflammatory changes or fluid. Circumferential atherosclerotic calcifications of the abdominal aorta. Celiac: Celiac artery is patent, with configuration at the origin compatible with overlying compression of the cruise. SMA: SMA patent with mild atherosclerosis at the origin. Renals: - Right: Single right renal artery with mild to moderate atherosclerotic changes at the origin. No evidence of high-grade stenosis. - Left: Single left renal artery with mild atherosclerosis at the origin. IMA: IMA is patent. Right lower extremity: Moderate atherosclerotic changes of the right iliac system. No dissection or aneurysm of the common iliac artery. No high-grade stenosis or occlusion. Hypogastric artery is patent. External iliac artery is patent with mild atherosclerosis. Common femoral artery with mild atherosclerotic changes with calcified plaque on the posterior wall. Profunda femoris and the thigh branches are patent. Advanced atherosclerotic changes of the right SFA with multifocal  calcified and soft plaque. Short segment critical stenosis in the proximal third. Additional high-grade stenosis in the mid third. At least 50% narrowing within the adductor canal. Popliteal artery is patent with moderate atherosclerotic changes. Proximal anterior tibial artery patent. Tibioperoneal trunk is patent with calcified plaque in the mid segment. Anterior tibial artery patent from the origin to the ankle with mild to moderate calcified plaque. Posterior tibial artery is small caliber with calcifications, appears to be occluded distally. Peroneal artery is patent proximally, small caliber, with some calcifications. Uncertain regarding patency distally. Left lower extremity: Moderate atherosclerotic changes of the left iliac system. No aneurysm, dissection, high-grade stenosis or occlusion. Hypogastric is patent with advanced atherosclerotic changes at the origin. External iliac artery patent with  moderate atherosclerotic changes. Common femoral artery patent with calcified plaque on the posterior wall. Profunda femoris and thigh branches patent. Advanced atherosclerotic changes of the left SFA with multifocal narrowing. 50% narrowing short segment proximal third. Critical stenosis in the mid third at the abductor canal. Tandem critical stenosis in the above knee popliteal artery. Popliteal artery patent with circumferential mild plaque. Patency of the anterior tibial artery uncertain given the degree of calcifications. Tibioperoneal trunk demonstrates at least moderate atherosclerotic calcifications and appears patent to the presumed site of the bifurcation. The length of the posterior tibial artery is not well visualized, except for linear calcifications in the expected location presumably occluded. Peroneal artery patency is uncertain. Veins: Unremarkable IVC and iliac veins. Engorged left great saphenous vein with tortuous accessory saphenous of the thigh associated with multiple engorged varicosities of  the left calf. On the right there is a smaller network of superficial venous varicosities associated with the great saphenous network, including distal thigh and the calf. Perforator veins are evident associated with the peroneal vein of the calf. Review of the MIP images confirms the above findings. NON-VASCULAR Lower chest: No acute finding at the lung bases Hepatobiliary: Unremarkable appearance of the liver. Gallbladder is contracted with possible wall thickening. No radiopaque stones within the gallbladder lumen. No inflammatory changes or pericholecystic fluid. Pancreas: Unremarkable. Spleen: Unremarkable. Adrenals/Urinary Tract: - Right adrenal gland: Unremarkable - Left adrenal gland: Unremarkable. - Right kidney: No hydronephrosis, nephrolithiasis, inflammation, or ureteral dilation. No focal lesion. - Left Kidney: No hydronephrosis, nephrolithiasis, inflammation, or ureteral dilation. No focal lesion. - Urinary Bladder: Urinary bladder partially distended. Stomach/Bowel: - Stomach: Unremarkable. - Small bowel: No abnormally distended small bowel. Partially fecalized distal small bowel. No focal inflammatory changes or air-fluid levels. No mesenteric inflammatory changes. - Appendix: Appendix is not visualized, however, no inflammatory changes are present adjacent to the cecum to indicate an appendicitis. - Colon: Colonic diverticula, without associated inflammatory changes. Lymphatic: Enlarged lymph nodes of the left greater than right inguinal nodal stations. Small lymph nodes along the left iliac nodal chain, associated with the left inguinal adenopathy and the left foot wound. Mesenteric: No free fluid or air. No mesenteric adenopathy. Reproductive: Unremarkable uterus/adnexa. Other: Fat containing umbilical hernia. Nonspecific pelvic floor laxity Musculoskeletal: Surgical changes of right hip fixation. Degenerative changes of the spine. Compression fracture of L1 with less than 50% height loss.  Indeterminate age. Circumferential soft tissue swelling of the left greater than right calf. Ulceration evident at the plantar aspect of the left foot IMPRESSION: Multi segment PA D including: -aortic atherosclerosis.  Aortic Atherosclerosis (ICD10-I70.0). -moderate bilateral iliac arterial disease without high-grade stenosis or occlusion of the CIA or EIA. Likely high-grade stenosis of left hypogastric origin. -advanced bilateral femoropopliteal disease, with multifocal high-grade SFA stenoses bilaterally. -bilateral tibial arterial disease. On the right the AT appears patent from the origin to the ankle, with uncertain patency of the PT and the peroneal artery. On the left, the peroneal artery is partially patent, with uncertain patency of the AT, PT, and distal peroneal artery. 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 Bilateral renal arterial disease without high-grade stenosis or occlusion. Evidence of bilateral, left greater than right venous insufficiency of the lower extremities. Left inguinal adenopathy and associated left iliac chain lymph nodes. These are presumably reactive given the known chronic left foot wound/ulceration. Additional ancillary findings as above. Signed, Dulcy Fanny. Dellia Nims, [facility] Radiology Electronically Signed   By: Corrie Mckusick  D.O.   On: 06/27/2020 16:38   MR FOOT LEFT WO CONTRAST  Result Date: 07/01/2020 CLINICAL DATA:  Left foot pain for 3 months EXAM: MRI OF THE LEFT FOOT WITHOUT CONTRAST TECHNIQUE: Multiplanar, multisequence MR imaging of the left foot was performed. No intravenous contrast was administered. COMPARISON:  X-ray 06/25/2020 FINDINGS: Technical note: Examination is significantly degraded by motion artifact. Bones/Joint/Cartilage Bone marrow edema with confluent low T1 marrow signal within the plantar aspect of the cuboid (series 7 and 8, images 15-16). Poor definition of the  overlying cortical signal. Patient is status post forefoot amputation. The remaining osseous structures of the midfoot and hindfoot are intact. No additional sites of bone marrow edema or osteomyelitis. No acute fracture or malalignment. No joint effusion. Ligaments Poorly evaluated.  No acute ligamentous injury is evident. Muscles and Tendons Post amputation changes to the flexor and extensor tendons. No tenosynovitis. Soft tissues Soft tissue ulceration at the plantar aspect of the left midfoot overlying the cuboid. There is associated soft tissue edema. No fluid collection or abscess. IMPRESSION: 1. Motion degraded exam. 2. Soft tissue ulceration at the left midfoot with acute osteomyelitis of the plantar aspect of the cuboid. 3. Prior forefoot amputation. No additional sites of acute osteomyelitis are identified. 4. No fluid collection or abscess. Electronically Signed   By: Davina Poke D.O.   On: 07/01/2020 16:09   US ARTERIAL ABI (SCREENING LOWER EXTREMITY)  Result Date: 06/25/2020 CLINICAL DATA:  85 year old female with a history of foot infection EXAM: NONINVASIVE PHYSIOLOGIC VASCULAR STUDY OF BILATERAL LOWER EXTREMITIES TECHNIQUE: Evaluation of both lower extremities was performed at rest, including calculation of ankle-brachial indices, multiple segmental pressure evaluation, segmental Doppler and segmental pulse volume recording. COMPARISON:  None. FINDINGS: Right ABI:  0.74 Left ABI:  0.60 Right Lower Extremity: Monophasic waveforms of the posterior tibial artery and dorsalis pedis. Left Lower Extremity: Monophasic waveforms of the posterior tibial artery and dorsalis pedis. IMPRESSION: Right: Resting ABI in the mild range of occlusive disease, although likely falsely elevated secondary to noncompressive vessels, given the monophasic waveforms at the ankles. Left: Resting ABI in the moderate range occlusive disease, likely falsely elevated secondary to noncompressible vessels given the monophasic  waveforms at the ankles. Signed, Dulcy Fanny. Dellia Nims, [name] Radiology Electronically Signed   By: Corrie Mckusick D.O.   On: 06/25/2020 15:45   IR FEM POP ART ATHERECT INC PTA MOD SED  Result Date: 07/01/2020 INDICATION: 85 year old female with left lower extremity wound dehiscence and PA D presents for angiogram and possible intervention EXAM: ULTRASOUND-GUIDED ACCESS RIGHT COMMON FEMORAL ARTERY PELVIC AND LEFT LOWER EXTREMITY ANGIOGRAM LASER ATHERECTOMY LEFT SFA AND LEFT PERONEAL ARTERY DRUG-ELUTING BALLOON ANGIOPLASTY LEFT SFA BALLOON ANGIOPLASTY LEFT PERONEAL ARTERY MEDICATIONS: 8000 units IV heparin. ANESTHESIA/SEDATION: Moderate (conscious) sedation was employed during this procedure. A total of Versed 3.0 mg and Fentanyl 150 mcg was administered intravenously. Moderate Sedation Time: 310 minutes. The patient's level of consciousness and vital signs were monitored continuously by radiology nursing throughout the procedure under my direct supervision. CONTRAST:  141 CC CONTRAST FLUOROSCOPY TIME:  Fluoroscopy Time: 28 minutes 0 seconds (104.8 mGy). COMPLICATIONS: None PROCEDURE: Informed consent was obtained from the patient following explanation of the procedure, risks, benefits and alternatives. The patient understands, agrees and consents for the procedure. All questions were addressed. A time out was performed prior to the initiation of the procedure. Maximal barrier sterile technique utilized including caps, mask, sterile gowns, sterile gloves, large sterile drape,  hand hygiene, and Betadine prep. Ultrasound survey of the right inguinal region was performed with images stored and sent to PACs, confirming patency of the vessel. 1% lidocaine was used for local anesthesia. Small stab incision was made. Blunt dissection was performed. A micropuncture needle was used access the right common femoral artery under ultrasound. With excellent arterial  blood flow returned, and an .018 micro wire was passed through the needle, observed enter the abdominal aorta under fluoroscopy. The needle was removed, and a micropuncture sheath was placed over the wire. The inner dilator and wire were removed, and an 035 Bentson wire was advanced under fluoroscopy into the abdominal aorta. The sheath was removed and a standard 5 [place] vascular sheath was placed. The dilator was removed and the sheath was flushed. Omni Flush catheter was advanced to the lower abdominal aorta. Angiogram was performed. Bentson wire was navigated into the left iliac system with the use of the Omni Flush catheter. Omni Flush catheter was removed and a 035 quick cross crossing catheter was advanced to the left common femoral artery. Angiogram of the left lower extremity was performed. Rose in wire was then placed through the crossing catheter and sheath exchange was made for a 6 French 45 cm straight tip to room 0 destination sheath. The sheath was position in the proximal superficial femoral artery, and the introducer and wire were removed. Given the disease segment of the peroneal artery contributing to the dorsalis pedis, we elected to use ultrasound-guided puncture distally for a retrograde approach. 1% lidocaine was used for local anesthesia. Ultrasound-guided access of the left dorsalis pedis was then performed with a V18 wire advanced through the needle retrograde. The wire was advanced to the proximal peroneal artery with the assistance of 018 crossing catheter. The wire and catheter were in a subintimal channel within the proximal peroneal artery. Catheter was withdrawn. A whisper wire was then used in attempt to find a luminal channel within the mid segment of the peroneal artery after withdrawal of the crossing catheter. We then used a Glidewire and an angled quick cross crossing catheter through the 6 [place] sheath. The catheter was navigated to the origin of the peroneal artery. 12 g  crossing wire was then passed through the angle crossing catheter antegrade through the peroneal artery with a distal position achieved. 2 mm balloon angioplasty was then performed in the proximal segment in order to create a connecting channel from the subintimal channel and the lumen of the proximal peroneal artery. We were then successful in navigating the retrograde directed 018 system into the true lumen of the peroneal artery and the tibioperoneal trunk. The quick cross catheter was then advanced retrograde to an angled vertebral catheter. A rendezvous was then performed with retrograde passage of a whisper wire. This wire was then ultimately externalized from both the pedal access and the right femoral sheath access. 014 crossing catheter was then passed distally over the wire. With a distal position achieved in the dorsalis pedis the wire was removed. Antegrade whisper wire was placed from the top down. Once the whisper wire was in position we initiated laser atherectomy of first the femoral segment, and then the tibial segment. 1.5 mm device was used for both the femoral and the tibial segment. After atherectomy was completed, balloon angioplasty was performed of the tibial segment, with 2.5 mm balloon angioplasty starting distally. We increased the diameter to a 3 mm segment proximally. Given some slow flow through the distal segment, repeat 2 mm balloon  angioplasty was performed in the distal peroneal artery crossing the ankle joint into the dorsalis pedis. Drug-eluting balloon angioplasty was then performed in the femoropopliteal segment, with 5 mm x 120 mm. We observed a 3 minutes interval. After treatment of both segments, there was excellent response of the femoral segment with 0 residual stenosis. There was restoration of flow through the previously occluded tibial/peroneal segment into the dorsalis pedis. A non flow limiting dissection was present. We confirmed a palpable pulse at the foot in the  dorsalis pedis segment and elected to withdraw at this point. The destination sheath was exchanged for a short 6 French sheath at the right common femoral artery access point. We attempted to deploy a Celt hemostasis device, which ultimately failed and was withdrawn. Manual pressure of 25 minutes was used for hemostasis with a pressure dressing applied. Patient tolerated the procedure well and remained hemodynamically stable throughout. Blood loss was estimated 50 cc. FINDINGS: Ultrasound demonstrates significant calcifications of the right common femoral artery though patent. Pelvic angiogram demonstrates mild atherosclerosis of the lower abdominal aorta. Bilateral iliac arteries with mild atherosclerosis and no high-grade stenosis or occlusion. Hypogastric arteries patent bilaterally. Bilateral external iliac arteries patent with mild to moderate calcified plaque. Bilateral common femoral arteries patent. Proximal right SFA and profunda femoris patent. Angiogram left lower extremity demonstrates patent profunda femoris and thigh branches. Moderate to advanced atherosclerotic changes of the left SFA with high-grade 80% stenosis in the adductor canal. Popliteal artery patent without significant stenosis. Posterior tibial artery is patent with mild atherosclerosis. There is a paucity of flow from the posterior tibial artery into the common plantar artery. Variant anatomy of the tibioperoneal trunk with the TP trunk contributing to anterior tibial artery and peroneal artery. Peroneal artery is significantly disease with distal contribution to the dorsalis pedis. Anterior tibial artery is occluded in the mid segment with collateral flow. After treatment of the SFA there is 0% residual stenosis in the abductor canal. After treatment of the tibial segment, target peroneal artery, there is 100% improved perfusion into the dorsalis pedis. Non flow limiting dissection present in the TP trunk and peroneal artery, with a  palpable pulse at the ankle. IMPRESSION: Status post ultrasound guided access right common femoral artery and left dorsalis pedis for treatment of multi segment left-sided PAD contributing to poor wound healing, with laser atherectomy of both the femoral segment and occluded left peroneal artery, drug-eluting balloon angioplasty of SFA, and PTA of left peroneal artery restoring palpable dorsalis pedis pulse. Variant anatomy observed, with the TP trunk contributing to peroneal artery and anterior tibial artery, and the peroneal artery terminating in the dorsalis pedis. Signed, Dulcy Fanny. Dellia Nims, [facility] Radiology Electronically Signed   By: Corrie Mckusick D.O.   On: 07/01/2020 09:10   IR TIB-PERO ART ATHEREC INC PTA MOD SED  Result Date: 07/02/2020 INDICATION: 85 year old female with left lower extremity wound dehiscence and PA D presents for angiogram and possible intervention EXAM: ULTRASOUND-GUIDED ACCESS RIGHT COMMON FEMORAL ARTERY PELVIC AND LEFT LOWER EXTREMITY ANGIOGRAM LASER ATHERECTOMY LEFT SFA AND LEFT PERONEAL ARTERY DRUG-ELUTING BALLOON ANGIOPLASTY LEFT SFA BALLOON ANGIOPLASTY LEFT PERONEAL ARTERY MEDICATIONS: 8000 units IV heparin. ANESTHESIA/SEDATION: Moderate (conscious) sedation was employed during this procedure. A total of Versed 3.0 mg and Fentanyl 150 mcg was administered intravenously. Moderate Sedation Time: 310 minutes. The patient's level of consciousness and vital signs were monitored continuously by radiology nursing throughout the procedure under my direct supervision. CONTRAST:  141  CC CONTRAST FLUOROSCOPY TIME:  Fluoroscopy Time: 28 minutes 0 seconds (104.8 mGy). COMPLICATIONS: None PROCEDURE: Informed consent was obtained from the patient following explanation of the procedure, risks, benefits and alternatives. The patient understands, agrees and consents for the procedure. All questions were addressed. A time out was  performed prior to the initiation of the procedure. Maximal barrier sterile technique utilized including caps, mask, sterile gowns, sterile gloves, large sterile drape, hand hygiene, and Betadine prep. Ultrasound survey of the right inguinal region was performed with images stored and sent to PACs, confirming patency of the vessel. 1% lidocaine was used for local anesthesia. Small stab incision was made. Blunt dissection was performed. A micropuncture needle was used access the right common femoral artery under ultrasound. With excellent arterial blood flow returned, and an .018 micro wire was passed through the needle, observed enter the abdominal aorta under fluoroscopy. The needle was removed, and a micropuncture sheath was placed over the wire. The inner dilator and wire were removed, and an 035 Bentson wire was advanced under fluoroscopy into the abdominal aorta. The sheath was removed and a standard 5 [place] vascular sheath was placed. The dilator was removed and the sheath was flushed. Omni Flush catheter was advanced to the lower abdominal aorta. Angiogram was performed. Bentson wire was navigated into the left iliac system with the use of the Omni Flush catheter. Omni Flush catheter was removed and a 035 quick cross crossing catheter was advanced to the left common femoral artery. Angiogram of the left lower extremity was performed. Rose in wire was then placed through the crossing catheter and sheath exchange was made for a 6 French 45 cm straight tip to room 0 destination sheath. The sheath was position in the proximal superficial femoral artery, and the introducer and wire were removed. Given the disease segment of the peroneal artery contributing to the dorsalis pedis, we elected to use ultrasound-guided puncture distally for a retrograde approach. 1% lidocaine was used for local anesthesia. Ultrasound-guided access of the left dorsalis pedis was then performed with a V18 wire advanced through the  needle retrograde. The wire was advanced to the proximal peroneal artery with the assistance of 018 crossing catheter. The wire and catheter were in a subintimal channel within the proximal peroneal artery. Catheter was withdrawn. A whisper wire was then used in attempt to find a luminal channel within the mid segment of the peroneal artery after withdrawal of the crossing catheter. We then used a Glidewire and an angled quick cross crossing catheter through the 6 [place] sheath. The catheter was navigated to the origin of the peroneal artery. 12 g crossing wire was then passed through the angle crossing catheter antegrade through the peroneal artery with a distal position achieved. 2 mm balloon angioplasty was then performed in the proximal segment in order to create a connecting channel from the subintimal channel and the lumen of the proximal peroneal artery. We were then successful in navigating the retrograde directed 018 system into the true lumen of the peroneal artery and the tibioperoneal trunk. The quick cross catheter was then advanced retrograde to an angled vertebral catheter. A rendezvous was then performed with retrograde passage of a whisper wire. This wire was then ultimately externalized from both the pedal access and the right femoral sheath access. 014 crossing catheter was then passed distally over the wire. With a distal position achieved in the dorsalis pedis the wire was removed. Antegrade whisper wire was placed from the top down. Once the whisper  wire was in position we initiated laser atherectomy of first the femoral segment, and then the tibial segment. 1.5 mm device was used for both the femoral and the tibial segment. After atherectomy was completed, balloon angioplasty was performed of the tibial segment, with 2.5 mm balloon angioplasty starting distally. We increased the diameter to a 3 mm segment proximally. Given some slow flow through the distal segment, repeat 2 mm balloon  angioplasty was performed in the distal peroneal artery crossing the ankle joint into the dorsalis pedis. Drug-eluting balloon angioplasty was then performed in the femoropopliteal segment, with 5 mm x 120 mm. We observed a 3 minutes interval. After treatment of both segments, there was excellent response of the femoral segment with 0 residual stenosis. There was restoration of flow through the previously occluded tibial/peroneal segment into the dorsalis pedis. A non flow limiting dissection was present. We confirmed a palpable pulse at the foot in the dorsalis pedis segment and elected to withdraw at this point. The destination sheath was exchanged for a short 6 French sheath at the right common femoral artery access point. We attempted to deploy a Celt hemostasis device, which ultimately failed and was withdrawn. Manual pressure of 25 minutes was used for hemostasis with a pressure dressing applied. Patient tolerated the procedure well and remained hemodynamically stable throughout. Blood loss was estimated 50 cc. FINDINGS: Ultrasound demonstrates significant calcifications of the right common femoral artery though patent. Pelvic angiogram demonstrates mild atherosclerosis of the lower abdominal aorta. Bilateral iliac arteries with mild atherosclerosis and no high-grade stenosis or occlusion. Hypogastric arteries patent bilaterally. Bilateral external iliac arteries patent with mild to moderate calcified plaque. Bilateral common femoral arteries patent. Proximal right SFA and profunda femoris patent. Angiogram left lower extremity demonstrates patent profunda femoris and thigh branches. Moderate to advanced atherosclerotic changes of the left SFA with high-grade 80% stenosis in the adductor canal. Popliteal artery patent without significant stenosis. Posterior tibial artery is patent with mild atherosclerosis. There is a paucity of flow from the posterior tibial artery into the common plantar artery. Variant  anatomy of the tibioperoneal trunk with the TP trunk contributing to anterior tibial artery and peroneal artery. Peroneal artery is significantly disease with distal contribution to the dorsalis pedis. Anterior tibial artery is occluded in the mid segment with collateral flow. After treatment of the SFA there is 0% residual stenosis in the abductor canal. After treatment of the tibial segment, target peroneal artery, there is 100% improved perfusion into the dorsalis pedis. Non flow limiting dissection present in the TP trunk and peroneal artery, with a palpable pulse at the ankle. IMPRESSION: Status post ultrasound guided access right common femoral artery and left dorsalis pedis for treatment of multi segment left-sided PAD contributing to poor wound healing, with laser atherectomy of both the femoral segment and occluded left peroneal artery, drug-eluting balloon angioplasty of SFA, and PTA of left peroneal artery restoring palpable dorsalis pedis pulse. Variant anatomy observed, with the TP trunk contributing to peroneal artery and anterior tibial artery, and the peroneal artery terminating in the dorsalis pedis. Signed, Dulcy Fanny. Dellia Nims, [facility] Radiology Electronically Signed   By: Corrie Mckusick D.O.   On: 07/01/2020 09:10   IR US Guide Vasc Access Left  Result Date: 07/01/2020 INDICATION: 85 year old female with left lower extremity wound dehiscence and PA D presents for angiogram and possible intervention EXAM: ULTRASOUND-GUIDED ACCESS RIGHT COMMON FEMORAL ARTERY PELVIC AND LEFT LOWER EXTREMITY ANGIOGRAM LASER ATHERECTOMY LEFT SFA  AND LEFT PERONEAL ARTERY DRUG-ELUTING BALLOON ANGIOPLASTY LEFT SFA BALLOON ANGIOPLASTY LEFT PERONEAL ARTERY MEDICATIONS: 8000 units IV heparin. ANESTHESIA/SEDATION: Moderate (conscious) sedation was employed during this procedure. A total of Versed 3.0 mg and Fentanyl 150 mcg was administered intravenously. Moderate  Sedation Time: 310 minutes. The patient's level of consciousness and vital signs were monitored continuously by radiology nursing throughout the procedure under my direct supervision. CONTRAST:  141 CC CONTRAST FLUOROSCOPY TIME:  Fluoroscopy Time: 28 minutes 0 seconds (104.8 mGy). COMPLICATIONS: None PROCEDURE: Informed consent was obtained from the patient following explanation of the procedure, risks, benefits and alternatives. The patient understands, agrees and consents for the procedure. All questions were addressed. A time out was performed prior to the initiation of the procedure. Maximal barrier sterile technique utilized including caps, mask, sterile gowns, sterile gloves, large sterile drape, hand hygiene, and Betadine prep. Ultrasound survey of the right inguinal region was performed with images stored and sent to PACs, confirming patency of the vessel. 1% lidocaine was used for local anesthesia. Small stab incision was made. Blunt dissection was performed. A micropuncture needle was used access the right common femoral artery under ultrasound. With excellent arterial blood flow returned, and an .018 micro wire was passed through the needle, observed enter the abdominal aorta under fluoroscopy. The needle was removed, and a micropuncture sheath was placed over the wire. The inner dilator and wire were removed, and an 035 Bentson wire was advanced under fluoroscopy into the abdominal aorta. The sheath was removed and a standard 5 [place] vascular sheath was placed. The dilator was removed and the sheath was flushed. Omni Flush catheter was advanced to the lower abdominal aorta. Angiogram was performed. Bentson wire was navigated into the left iliac system with the use of the Omni Flush catheter. Omni Flush catheter was removed and a 035 quick cross crossing catheter was advanced to the left common femoral artery. Angiogram of the left lower extremity was performed. Rose in wire was then placed through the  crossing catheter and sheath exchange was made for a 6 French 45 cm straight tip to room 0 destination sheath. The sheath was position in the proximal superficial femoral artery, and the introducer and wire were removed. Given the disease segment of the peroneal artery contributing to the dorsalis pedis, we elected to use ultrasound-guided puncture distally for a retrograde approach. 1% lidocaine was used for local anesthesia. Ultrasound-guided access of the left dorsalis pedis was then performed with a V18 wire advanced through the needle retrograde. The wire was advanced to the proximal peroneal artery with the assistance of 018 crossing catheter. The wire and catheter were in a subintimal channel within the proximal peroneal artery. Catheter was withdrawn. A whisper wire was then used in attempt to find a luminal channel within the mid segment of the peroneal artery after withdrawal of the crossing catheter. We then used a Glidewire and an angled quick cross crossing catheter through the 6 [place] sheath. The catheter was navigated to the origin of the peroneal artery. 12 g crossing wire was then passed through the angle crossing catheter antegrade through the peroneal artery with a distal position achieved. 2 mm balloon angioplasty was then performed in the proximal segment in order to create a connecting channel from the subintimal channel and the lumen of the proximal peroneal artery. We were then successful in navigating the retrograde directed 018 system into the true lumen of the peroneal artery and the tibioperoneal trunk. The quick cross catheter was then advanced  retrograde to an angled vertebral catheter. A rendezvous was then performed with retrograde passage of a whisper wire. This wire was then ultimately externalized from both the pedal access and the right femoral sheath access. 014 crossing catheter was then passed distally over the wire. With a distal position achieved in the dorsalis pedis the  wire was removed. Antegrade whisper wire was placed from the top down. Once the whisper wire was in position we initiated laser atherectomy of first the femoral segment, and then the tibial segment. 1.5 mm device was used for both the femoral and the tibial segment. After atherectomy was completed, balloon angioplasty was performed of the tibial segment, with 2.5 mm balloon angioplasty starting distally. We increased the diameter to a 3 mm segment proximally. Given some slow flow through the distal segment, repeat 2 mm balloon angioplasty was performed in the distal peroneal artery crossing the ankle joint into the dorsalis pedis. Drug-eluting balloon angioplasty was then performed in the femoropopliteal segment, with 5 mm x 120 mm. We observed a 3 minutes interval. After treatment of both segments, there was excellent response of the femoral segment with 0 residual stenosis. There was restoration of flow through the previously occluded tibial/peroneal segment into the dorsalis pedis. A non flow limiting dissection was present. We confirmed a palpable pulse at the foot in the dorsalis pedis segment and elected to withdraw at this point. The destination sheath was exchanged for a short 6 French sheath at the right common femoral artery access point. We attempted to deploy a Celt hemostasis device, which ultimately failed and was withdrawn. Manual pressure of 25 minutes was used for hemostasis with a pressure dressing applied. Patient tolerated the procedure well and remained hemodynamically stable throughout. Blood loss was estimated 50 cc. FINDINGS: Ultrasound demonstrates significant calcifications of the right common femoral artery though patent. Pelvic angiogram demonstrates mild atherosclerosis of the lower abdominal aorta. Bilateral iliac arteries with mild atherosclerosis and no high-grade stenosis or occlusion. Hypogastric arteries patent bilaterally. Bilateral external iliac arteries patent with mild to  moderate calcified plaque. Bilateral common femoral arteries patent. Proximal right SFA and profunda femoris patent. Angiogram left lower extremity demonstrates patent profunda femoris and thigh branches. Moderate to advanced atherosclerotic changes of the left SFA with high-grade 80% stenosis in the adductor canal. Popliteal artery patent without significant stenosis. Posterior tibial artery is patent with mild atherosclerosis. There is a paucity of flow from the posterior tibial artery into the common plantar artery. Variant anatomy of the tibioperoneal trunk with the TP trunk contributing to anterior tibial artery and peroneal artery. Peroneal artery is significantly disease with distal contribution to the dorsalis pedis. Anterior tibial artery is occluded in the mid segment with collateral flow. After treatment of the SFA there is 0% residual stenosis in the abductor canal. After treatment of the tibial segment, target peroneal artery, there is 100% improved perfusion into the dorsalis pedis. Non flow limiting dissection present in the TP trunk and peroneal artery, with a palpable pulse at the ankle. IMPRESSION: Status post ultrasound guided access right common femoral artery and left dorsalis pedis for treatment of multi segment left-sided PAD contributing to poor wound healing, with laser atherectomy of both the femoral segment and occluded left peroneal artery, drug-eluting balloon angioplasty of SFA, and PTA of left peroneal artery restoring palpable dorsalis pedis pulse. Variant anatomy observed, with the TP trunk contributing to peroneal artery and anterior tibial artery, and the peroneal artery terminating in the dorsalis pedis. Signed, Dulcy Fanny. Earleen Newport,  DO, RPVI [facility] Radiology Electronically Signed   By: Corrie Mckusick D.O.   On: 07/01/2020 09:10   IR US Guide Vasc Access Right  Result Date: 07/01/2020 INDICATION: 85 year old female with left  lower extremity wound dehiscence and PA D presents for angiogram and possible intervention EXAM: ULTRASOUND-GUIDED ACCESS RIGHT COMMON FEMORAL ARTERY PELVIC AND LEFT LOWER EXTREMITY ANGIOGRAM LASER ATHERECTOMY LEFT SFA AND LEFT PERONEAL ARTERY DRUG-ELUTING BALLOON ANGIOPLASTY LEFT SFA BALLOON ANGIOPLASTY LEFT PERONEAL ARTERY MEDICATIONS: 8000 units IV heparin. ANESTHESIA/SEDATION: Moderate (conscious) sedation was employed during this procedure. A total of Versed 3.0 mg and Fentanyl 150 mcg was administered intravenously. Moderate Sedation Time: 310 minutes. The patient's level of consciousness and vital signs were monitored continuously by radiology nursing throughout the procedure under my direct supervision. CONTRAST:  141 CC CONTRAST FLUOROSCOPY TIME:  Fluoroscopy Time: 28 minutes 0 seconds (104.8 mGy). COMPLICATIONS: None PROCEDURE: Informed consent was obtained from the patient following explanation of the procedure, risks, benefits and alternatives. The patient understands, agrees and consents for the procedure. All questions were addressed. A time out was performed prior to the initiation of the procedure. Maximal barrier sterile technique utilized including caps, mask, sterile gowns, sterile gloves, large sterile drape, hand hygiene, and Betadine prep. Ultrasound survey of the right inguinal region was performed with images stored and sent to PACs, confirming patency of the vessel. 1% lidocaine was used for local anesthesia. Small stab incision was made. Blunt dissection was performed. A micropuncture needle was used access the right common femoral artery under ultrasound. With excellent arterial blood flow returned, and an .018 micro wire was passed through the needle, observed enter the abdominal aorta under fluoroscopy. The needle was removed, and a micropuncture sheath was placed over the wire. The inner dilator and wire were removed, and an 035 Bentson wire was advanced under fluoroscopy into the  abdominal aorta. The sheath was removed and a standard 5 [place] vascular sheath was placed. The dilator was removed and the sheath was flushed. Omni Flush catheter was advanced to the lower abdominal aorta. Angiogram was performed. Bentson wire was navigated into the left iliac system with the use of the Omni Flush catheter. Omni Flush catheter was removed and a 035 quick cross crossing catheter was advanced to the left common femoral artery. Angiogram of the left lower extremity was performed. Rose in wire was then placed through the crossing catheter and sheath exchange was made for a 6 French 45 cm straight tip to room 0 destination sheath. The sheath was position in the proximal superficial femoral artery, and the introducer and wire were removed. Given the disease segment of the peroneal artery contributing to the dorsalis pedis, we elected to use ultrasound-guided puncture distally for a retrograde approach. 1% lidocaine was used for local anesthesia. Ultrasound-guided access of the left dorsalis pedis was then performed with a V18 wire advanced through the needle retrograde. The wire was advanced to the proximal peroneal artery with the assistance of 018 crossing catheter. The wire and catheter were in a subintimal channel within the proximal peroneal artery. Catheter was withdrawn. A whisper wire was then used in attempt to find a luminal channel within the mid segment of the peroneal artery after withdrawal of the crossing catheter. We then used a Glidewire and an angled quick cross crossing catheter through the 6 [place] sheath. The catheter was navigated to the origin of the peroneal artery. 12 g crossing wire was then passed through the angle crossing catheter  antegrade through the peroneal artery with a distal position achieved. 2 mm balloon angioplasty was then performed in the proximal segment in order to create a connecting channel from the subintimal channel and the lumen of the proximal peroneal  artery. We were then successful in navigating the retrograde directed 018 system into the true lumen of the peroneal artery and the tibioperoneal trunk. The quick cross catheter was then advanced retrograde to an angled vertebral catheter. A rendezvous was then performed with retrograde passage of a whisper wire. This wire was then ultimately externalized from both the pedal access and the right femoral sheath access. 014 crossing catheter was then passed distally over the wire. With a distal position achieved in the dorsalis pedis the wire was removed. Antegrade whisper wire was placed from the top down. Once the whisper wire was in position we initiated laser atherectomy of first the femoral segment, and then the tibial segment. 1.5 mm device was used for both the femoral and the tibial segment. After atherectomy was completed, balloon angioplasty was performed of the tibial segment, with 2.5 mm balloon angioplasty starting distally. We increased the diameter to a 3 mm segment proximally. Given some slow flow through the distal segment, repeat 2 mm balloon angioplasty was performed in the distal peroneal artery crossing the ankle joint into the dorsalis pedis. Drug-eluting balloon angioplasty was then performed in the femoropopliteal segment, with 5 mm x 120 mm. We observed a 3 minutes interval. After treatment of both segments, there was excellent response of the femoral segment with 0 residual stenosis. There was restoration of flow through the previously occluded tibial/peroneal segment into the dorsalis pedis. A non flow limiting dissection was present. We confirmed a palpable pulse at the foot in the dorsalis pedis segment and elected to withdraw at this point. The destination sheath was exchanged for a short 6 French sheath at the right common femoral artery access point. We attempted to deploy a Celt hemostasis device, which ultimately failed and was withdrawn. Manual pressure of 25 minutes was used for  hemostasis with a pressure dressing applied. Patient tolerated the procedure well and remained hemodynamically stable throughout. Blood loss was estimated 50 cc. FINDINGS: Ultrasound demonstrates significant calcifications of the right common femoral artery though patent. Pelvic angiogram demonstrates mild atherosclerosis of the lower abdominal aorta. Bilateral iliac arteries with mild atherosclerosis and no high-grade stenosis or occlusion. Hypogastric arteries patent bilaterally. Bilateral external iliac arteries patent with mild to moderate calcified plaque. Bilateral common femoral arteries patent. Proximal right SFA and profunda femoris patent. Angiogram left lower extremity demonstrates patent profunda femoris and thigh branches. Moderate to advanced atherosclerotic changes of the left SFA with high-grade 80% stenosis in the adductor canal. Popliteal artery patent without significant stenosis. Posterior tibial artery is patent with mild atherosclerosis. There is a paucity of flow from the posterior tibial artery into the common plantar artery. Variant anatomy of the tibioperoneal trunk with the TP trunk contributing to anterior tibial artery and peroneal artery. Peroneal artery is significantly disease with distal contribution to the dorsalis pedis. Anterior tibial artery is occluded in the mid segment with collateral flow. After treatment of the SFA there is 0% residual stenosis in the abductor canal. After treatment of the tibial segment, target peroneal artery, there is 100% improved perfusion into the dorsalis pedis. Non flow limiting dissection present in the TP trunk and peroneal artery, with a palpable pulse at the ankle. IMPRESSION: Status post ultrasound guided access right common femoral artery and left dorsalis  pedis for treatment of multi segment left-sided PAD contributing to poor wound healing, with laser atherectomy of both the femoral segment and occluded left peroneal artery, drug-eluting  balloon angioplasty of SFA, and PTA of left peroneal artery restoring palpable dorsalis pedis pulse. Variant anatomy observed, with the TP trunk contributing to peroneal artery and anterior tibial artery, and the peroneal artery terminating in the dorsalis pedis. Signed, Dulcy Fanny. Dellia Nims, RPVI [facility] and Interventional Radiology Specialists [facility] Radiology Electronically Signed   By: Corrie Mckusick D.O.   On: 07/01/2020 09:10   DG Foot Complete Left  Result Date: 06/25/2020 CLINICAL DATA:  Diabetic patient with a skin ulceration on the left foot. History of prior amputation. EXAM: LEFT FOOT - COMPLETE 3+ VIEW COMPARISON:  Plain films left ankle 11/18/2016. FINDINGS: Again seen is postoperative change of forefoot amputation. There is a large skin ulceration projecting inferior to the cuboid. There is subtle loss of cortical bone along the inferior margin of the cuboid worrisome for osteomyelitis. Multiple small calcifications are seen at the amputation site. On the frontal view, 2 ossific fragments are seen at the wound. IMPRESSION: Large skin wound on the plantar surface of the foot with findings worrisome for osteomyelitis of the cuboid. Electronically Signed   By: Inge Rise M.D.   On: 06/25/2020 11:42   [place] EKG SITE RITE  Result Date: 07/02/2020 If Site Rite image not attached, placement could not be confirmed due to current cardiac rhythm.      Subjective: Denies any significant pain.  No shortness of breath.  No cough.  Discharge Exam: Vitals:   07/01/20 2031 07/01/20 2046 07/02/20 0850 07/02/20 2036  BP:  (!) 121/55 109/65 (!) 116/54  Pulse: 67 70 67 (!) 56  Resp: 16   20  Temp: 97.9 F (36.6 C)   98.1 F (36.7 C)  TempSrc: Oral   Oral  SpO2: 95%  97% 98%  Weight:      Height:        General: Pt is alert, awake, not in acute distress Cardiovascular: RRR, S1/S2 +, no rubs, no gallops Respiratory: CTA bilaterally, no wheezing, no rhonchi Abdominal: Soft, NT, ND,  bowel sounds + Extremities: Status post left foot transmetatarsal amputation.  Foot is wrapped in dressings.    The results of significant diagnostics from this hospitalization (including imaging, microbiology, ancillary and laboratory) are listed below for reference.     Microbiology: Recent Results (from the past 240 hour(s))  Wound or Superficial Culture     Status: None   Collection Time: 06/25/20 11:35 AM   Specimen: Foot; Wound  Result Value Ref Range Status   Specimen Description   Final    FOOT LEFT Performed at [facility], [address], [place], [place] [address]    Special Requests   Final    NONE Performed at [facility], [address] [place], [place] [address]    Gram Stain   Final    NO WBC SEEN ABUNDANT GRAM POSITIVE COCCI FEW GRAM NEGATIVE RODS    Culture   Final    FEW GROUP B STREP(S.AGALACTIAE)ISOLATED TESTING AGAINST S. AGALACTIAE NOT ROUTINELY PERFORMED DUE TO PREDICTABILITY OF AMP/PEN/VAN SUSCEPTIBILITY. NO STAPHYLOCOCCUS AUREUS ISOLATED WITHIN MIXED CULTURE Performed at [facility], [place] [address] [place],  [address]    Report Status 06/27/2020 FINAL  Final  Culture, blood (routine x 2)     Status: None   Collection Time: 06/25/20 12:01 PM   Specimen: BLOOD  Result Value Ref Range Status   Specimen Description BLOOD BLOOD LEFT HAND  Final   Special Requests   Final    BOTTLES DRAWN AEROBIC AND ANAEROBIC Blood Culture results may not be optimal due to an inadequate volume of blood received in culture bottles   Culture   Final    NO GROWTH 5 DAYS Performed at [facility], [address], [place], [place] [address]    Report Status 06/30/2020 FINAL  Final  Culture, blood (routine x 2)     Status: None   Collection Time: 06/25/20 12:01 PM   Specimen: BLOOD  Result Value Ref Range Status   Specimen Description BLOOD LEFT ANTECUBITAL  Final   Special Requests   Final    BOTTLES DRAWN AEROBIC AND ANAEROBIC Blood Culture adequate  volume   Culture   Final    NO GROWTH 5 DAYS Performed at [facility], [address] [place],  [address]    Report Status 06/30/2020 FINAL  Final  SARS CORONAVIRUS 2 (TAT 6-24 HRS) Nasopharyngeal Nasopharyngeal Swab     Status: None   Collection Time: 06/25/20  2:00 PM   Specimen: Nasopharyngeal Swab  Result Value Ref Range Status   SARS Coronavirus 2 NEGATIVE NEGATIVE Final    Comment: (NOTE) SARS-CoV-2 target nucleic acids are NOT DETECTED.  The SARS-CoV-2 RNA is generally detectable in upper and lower respiratory specimens during the acute phase of infection. Negative results do not preclude SARS-CoV-2 infection, do not rule out co-infections with other pathogens, and should not be used as the sole basis for treatment or other patient management decisions. Negative results must be combined with clinical observations, patient history, and epidemiological information. The expected result is Negative.  Fact Sheet for Patients: SugarRoll.be  Fact Sheet for Healthcare Providers: https://www.woods-mathews.com/  This test is not yet approved or cleared by the [place] FDA and  has been authorized for detection and/or diagnosis of SARS-CoV-2 by FDA under an Emergency Use Authorization (EUA). This EUA will remain  in effect (meaning this test can be used) for the duration of the COVID-19 declaration under Se ction 564(b)(1) of the Act, 21 U.S.C. section 360bbb-3(b)(1), unless the authorization is terminated or revoked sooner.  Performed at [facility], [place] [address], [place],  [address]   MRSA PCR Screening     Status: Abnormal   Collection Time: 06/26/20  1:32 AM   Specimen: Nasal Mucosa; Nasopharyngeal  Result Value Ref Range Status   MRSA by PCR POSITIVE (A) NEGATIVE Final    Comment:        The GeneXpert MRSA Assay (FDA approved for NASAL specimens only), is one component of a comprehensive MRSA  colonization surveillance program. It is not intended to diagnose MRSA infection nor to guide or monitor treatment for MRSA infections. RESULT CALLED TO, READ BACK BY AND VERIFIED WITH: PEACH,[email or web address]  06/26/20 [facility] Performed at [facility], [address], [place], [place] [address]   SARS CORONAVIRUS 2 (TAT 6-24 HRS) Nasopharyngeal Nasopharyngeal Swab     Status: Abnormal   Collection Time: 07/01/20  8:52 AM   Specimen: Nasopharyngeal Swab  Result Value Ref Range Status   SARS Coronavirus 2 POSITIVE (A) NEGATIVE Final    Comment: (NOTE) SARS-CoV-2 target nucleic acids are DETECTED.  The SARS-CoV-2 RNA is generally detectable in upper and lower respiratory specimens during the acute phase of infection. Positive results are indicative of the presence of SARS-CoV-2 RNA. Clinical correlation with patient history and other diagnostic information  is  necessary to determine patient infection status. Positive results do not rule out bacterial infection or co-infection with other viruses.  The expected result is Negative.  Fact Sheet for Patients: SugarRoll.be  Fact Sheet for Healthcare Providers: https://www.woods-mathews.com/  This test is not yet approved or cleared by the [place] FDA and  has been authorized for detection and/or diagnosis of SARS-CoV-2 by FDA under an Emergency Use Authorization (EUA). This EUA will remain  in effect (meaning this test can be used) for the duration of the COVID-19 declaration under Section 564(b)(1) of the Act, 21 U. S.C. section 360bbb-3(b)(1), unless the authorization is terminated or revoked sooner.   Performed at [facility], [place] [address] [place], [place] [address]   Resp Panel by RT-PCR (Flu A&B, Covid) Nasopharyngeal Swab     Status: Abnormal   Collection Time: 07/01/20 12:05 PM   Specimen: Nasopharyngeal Swab; Nasopharyngeal(NP) swabs in vial transport medium  Result Value Ref  Range Status   SARS Coronavirus 2 by RT PCR POSITIVE (A) NEGATIVE Final    Comment: RESULT CALLED TO, READ BACK BY AND VERIFIED WITH: THOMAS, C. 1/10 @ 1320 BY BEARD, S. (NOTE) SARS-CoV-2 target nucleic acids are DETECTED.  The SARS-CoV-2 RNA is generally detectable in upper respiratory specimens during the acute phase of infection. Positive results are indicative of the presence of the identified virus, but do not rule out bacterial infection or co-infection with other pathogens not detected by the test. Clinical correlation with patient history and other diagnostic information is necessary to determine patient infection status. The expected result is Negative.  Fact Sheet for Patients: EntrepreneurPulse.com.au  Fact Sheet for Healthcare Providers: IncredibleEmployment.be  This test is not yet approved or cleared by the [place] FDA and  has been authorized for detection and/or diagnosis of SARS-CoV-2 by FDA under an Emergency Use Authorization (EUA).  This EUA will remain in effect (meaning this test can  be used) for the duration of  the COVID-19 declaration under Section 564(b)(1) of the Act, 21 U.S.C. section 360bbb-3(b)(1), unless the authorization is terminated or revoked sooner.     Influenza A by PCR NEGATIVE NEGATIVE Final   Influenza B by PCR NEGATIVE NEGATIVE Final    Comment: (NOTE) The Xpert Xpress SARS-CoV-2/FLU/RSV plus assay is intended as an aid in the diagnosis of influenza from Nasopharyngeal swab specimens and should not be used as a sole basis for treatment. Nasal washings and aspirates are unacceptable for Xpert Xpress SARS-CoV-2/FLU/RSV testing.  Fact Sheet for Patients: EntrepreneurPulse.com.au  Fact Sheet for Healthcare Providers: IncredibleEmployment.be  This test is not yet approved or cleared by the [place] FDA and has been authorized for detection and/or  diagnosis of SARS-CoV-2 by FDA under an Emergency Use Authorization (EUA). This EUA will remain in effect (meaning this test can be used) for the duration of the COVID-19 declaration under Section 564(b)(1) of the Act, 21 U.S.C. section 360bbb-3(b)(1), unless the authorization is terminated or revoked.  Performed at [facility], [address], [place], [place] [address]      Labs: BNP (last 3 results) No results for input(s): BNP in the last 8760 hours. Basic Metabolic Panel: Recent Labs  Lab 06/26/20 0417 06/28/20 0632 06/30/20 0646 07/01/20 0635  NA 134* 134* 131* 132*  K 3.6 3.8 4.2 3.9  CL 98 96* 92* 90*  CO2 _0 GLUCOSE 114* 345* 338* 246*  BUN 25* 18 23 25*  CREATININE 0.74 0.88 0.87 0.81  CALCIUM 8.8* 8.9 8.8* 8.4*  MG 2.2  --   --   --    Liver Function Tests: Recent Labs  Lab 06/26/20 0417  AST 13*  ALT 14  ALKPHOS 90  BILITOT 0.5  PROT 7.3  ALBUMIN 2.5*   No results for input(s): LIPASE, AMYLASE in the last 168 hours. No results for input(s): AMMONIA in the last 168 hours. CBC: Recent Labs  Lab 06/26/20 0417 06/28/20 0632 06/30/20 0646 07/01/20 0635  WBC 9.2 7.1 6.0 4.7  NEUTROABS  --   --  3.7 2.3  HGB 11.5* 11.9* 11.3* 10.3*  HCT 36.5 37.7 37.3 33.2*  MCV 97.6 98.2 99.7 98.2  PLT 350 334 271 265   Cardiac Enzymes: No results for input(s): CKTOTAL, CKMB, CKMBINDEX, TROPONINI in the last 168 hours. BNP: Invalid input(s): POCBNP CBG: Recent Labs  Lab 07/01/20 2024 07/02/20 0731 07/02/20 1121 07/02/20 1621 07/02/20 2033  GLUCAP 265* 363* 377* 205* 137*   D-Dimer No results for input(s): DDIMER in the last 72 hours. Hgb A1c No results for input(s): HGBA1C in the last 72 hours. Lipid Profile No results for input(s): CHOL, HDL, LDLCALC, TRIG, CHOLHDL, LDLDIRECT in the last 72 hours. Thyroid function studies No results for input(s): TSH, T4TOTAL, T3FREE, THYROIDAB in the last 72 hours.  Invalid input(s): FREET3 Anemia  work up No results for input(s): VITAMINB12, FOLATE, FERRITIN, TIBC, IRON, RETICCTPCT in the last 72 hours. Urinalysis    Component Value Date/Time   COLORURINE YELLOW 02/09/2017 2030   [place] 02/09/2017 2030   [place] 1.009 02/09/2017 2030   PHURINE 7.0 02/09/2017 2030   GLUCOSEU NEGATIVE 02/09/2017 2030   [place] NEGATIVE 02/09/2017 2030   [place] 02/09/2017 2030   [place] 02/09/2017 2030   PROTEINUR 30 (A) 02/09/2017 2030   UROBILINOGEN 0.2 02/05/2015 0528   NITRITE NEGATIVE 02/09/2017 2030   LEUKOCYTESUR MODERATE (A) 02/09/2017 2030   Sepsis Labs Invalid input(s): PROCALCITONIN,  WBC,  LACTICIDVEN Microbiology Recent Results (from the past 240 hour(s))  Wound or Superficial Culture     Status: None   Collection Time: 06/25/20 11:35 AM   Specimen: Foot; Wound  Result Value Ref Range Status   Specimen Description   Final    FOOT LEFT Performed at [facility], [address] [place], [place] [address]    Special Requests   Final    NONE Performed at [facility], [address], [place], [place] [address]    Gram Stain   Final    NO WBC SEEN ABUNDANT GRAM POSITIVE COCCI FEW GRAM NEGATIVE RODS    Culture   Final    FEW GROUP B STREP(S.AGALACTIAE)ISOLATED TESTING AGAINST S. AGALACTIAE NOT ROUTINELY PERFORMED DUE TO PREDICTABILITY OF AMP/PEN/VAN SUSCEPTIBILITY. NO STAPHYLOCOCCUS AUREUS ISOLATED WITHIN MIXED CULTURE Performed at [facility], [place] [address] [place], [place] [address]    Report Status 06/27/2020 FINAL  Final  Culture, blood (routine x 2)     Status: None   Collection Time: 06/25/20 12:01 PM   Specimen: BLOOD  Result Value Ref Range Status   Specimen Description BLOOD BLOOD LEFT HAND  Final   Special Requests   Final    BOTTLES DRAWN AEROBIC AND ANAEROBIC Blood Culture results may not be optimal due to an inadequate volume of blood received in culture bottles   Culture   Final    NO GROWTH 5 DAYS Performed at  [facility], [address] [place], [place] [address]    Report  Status 06/30/2020 FINAL  Final  Culture, blood (routine x 2)     Status: None   Collection Time: 06/25/20 12:01 PM   Specimen: BLOOD  Result Value Ref Range Status   Specimen Description BLOOD LEFT ANTECUBITAL  Final   Special Requests   Final    BOTTLES DRAWN AEROBIC AND ANAEROBIC Blood Culture adequate volume   Culture   Final    NO GROWTH 5 DAYS Performed at [facility], [address] [place], [place] [address]    Report Status 06/30/2020 FINAL  Final  SARS CORONAVIRUS 2 (TAT 6-24 HRS) Nasopharyngeal Nasopharyngeal Swab     Status: None   Collection Time: 06/25/20  2:00 PM   Specimen: Nasopharyngeal Swab  Result Value Ref Range Status   SARS Coronavirus 2 NEGATIVE NEGATIVE Final    Comment: (NOTE) SARS-CoV-2 target nucleic acids are NOT DETECTED.  The SARS-CoV-2 RNA is generally detectable in upper and lower respiratory specimens during the acute phase of infection. Negative results do not preclude SARS-CoV-2 infection, do not rule out co-infections with other pathogens, and should not be used as the sole basis for treatment or other patient management decisions. Negative results must be combined with clinical observations, patient history, and epidemiological information. The expected result is Negative.  Fact Sheet for Patients: SugarRoll.be  Fact Sheet for Healthcare Providers: https://www.woods-mathews.com/  This test is not yet approved or cleared by the [place] FDA and  has been authorized for detection and/or diagnosis of SARS-CoV-2 by FDA under an Emergency Use Authorization (EUA). This EUA will remain  in effect (meaning this test can be used) for the duration of the COVID-19 declaration under Se ction 564(b)(1) of the Act, 21 U.S.C. section 360bbb-3(b)(1), unless the authorization is terminated or revoked sooner.  Performed at [facility], [place] [address] [place], [place] [address]   MRSA PCR Screening     Status: Abnormal   Collection Time: 06/26/20  1:32 AM   Specimen: Nasal Mucosa; Nasopharyngeal  Result Value Ref Range Status   MRSA by PCR POSITIVE (A) NEGATIVE Final    Comment:        The GeneXpert MRSA Assay (FDA approved for NASAL specimens only), is one component of a comprehensive MRSA colonization surveillance program. It is not intended to diagnose MRSA infection nor to guide or monitor treatment for MRSA infections. RESULT CALLED TO, READ BACK BY AND VERIFIED WITH: PEACH,RN_0  06/26/20 [facility] Performed at [facility], [address], [place], [place] [address]   SARS CORONAVIRUS 2 (TAT 6-24 HRS) Nasopharyngeal Nasopharyngeal Swab     Status: Abnormal   Collection Time: 07/01/20  8:52 AM   Specimen: Nasopharyngeal Swab  Result Value Ref Range Status   SARS Coronavirus 2 POSITIVE (A) NEGATIVE Final    Comment: (NOTE) SARS-CoV-2 target nucleic acids are DETECTED.  The SARS-CoV-2 RNA is generally detectable in upper and lower respiratory specimens during the acute phase of infection. Positive results are indicative of the presence of SARS-CoV-2 RNA. Clinical correlation with patient history and other diagnostic information is  necessary to determine patient infection status. Positive results do not rule out bacterial infection or co-infection with other viruses.  The expected result is Negative.  Fact Sheet for Patients: SugarRoll.be  Fact Sheet for Healthcare Providers: https://www.woods-mathews.com/  This test is not yet approved or cleared by the [place] FDA and  has been authorized for detection and/or diagnosis of SARS-CoV-2 by FDA under an Emergency Use Authorization (EUA). This EUA will remain  in effect (meaning this test can be used) for the duration of the COVID-19 declaration under Section 564(b)(1) of the Act, 21 U. S.C. section  360bbb-3(b)(1), unless the authorization is terminated or revoked sooner.   Performed at [facility], [place] [address] [place], [place] [address]   Resp Panel by RT-PCR (Flu A&B, Covid) Nasopharyngeal Swab     Status: Abnormal   Collection Time: 07/01/20 12:05 PM   Specimen: Nasopharyngeal Swab; Nasopharyngeal(NP) swabs in vial transport medium  Result Value Ref Range Status   SARS Coronavirus 2 by RT PCR POSITIVE (A) NEGATIVE Final    Comment: RESULT CALLED TO, READ BACK BY AND VERIFIED WITH: THOMAS, C. 1/10 @ 1320 BY BEARD, S. (NOTE) SARS-CoV-2 target nucleic acids are DETECTED.  The SARS-CoV-2 RNA is generally detectable in upper respiratory specimens during the acute phase of infection. Positive results are indicative of the presence of the identified virus, but do not rule out bacterial infection or co-infection with other pathogens not detected by the test. Clinical correlation with patient history and other diagnostic information is necessary to determine patient infection status. The expected result is Negative.  Fact Sheet for Patients: EntrepreneurPulse.com.au  Fact Sheet for Healthcare Providers: IncredibleEmployment.be  This test is not yet approved or cleared by the [place] FDA and  has been authorized for detection and/or diagnosis of SARS-CoV-2 by FDA under an Emergency Use Authorization (EUA).  This EUA will remain in effect (meaning this test can  be used) for the duration of  the COVID-19 declaration under Section 564(b)(1) of the Act, 21 U.S.C. section 360bbb-3(b)(1), unless the authorization is terminated or revoked sooner.     Influenza A by PCR NEGATIVE NEGATIVE Final   Influenza B by PCR NEGATIVE NEGATIVE Final    Comment: (NOTE) The Xpert Xpress SARS-CoV-2/FLU/RSV plus assay is intended as an aid in the diagnosis of influenza from Nasopharyngeal swab specimens and should not be used as a sole basis for  treatment. Nasal washings and aspirates are unacceptable for Xpert Xpress SARS-CoV-2/FLU/RSV testing.  Fact Sheet for Patients: EntrepreneurPulse.com.au  Fact Sheet for Healthcare Providers: IncredibleEmployment.be  This test is not yet approved or cleared by the [place] FDA and has been authorized for detection and/or diagnosis of SARS-CoV-2 by FDA under an Emergency Use Authorization (EUA). This EUA will remain in effect (meaning this test can be used) for the duration of the COVID-19 declaration under Section 564(b)(1) of the Act, 21 U.S.C. section 360bbb-3(b)(1), unless the authorization is terminated or revoked.  Performed at [facility], [address] [place],  [address]      Time coordinating discharge: 41mns  SIGNED:   JKathie Dike MD  [facility] 07/02/2020, 9:21 PM   If 7PM-7AM, please contact night-coverage [email or web address]

## 2020-07-02 NOTE — TOC Progression Note (Signed)
Transition of Care [facility]) - Progression Note    Patient Details  Name: Tara Flores MRN: [id number] Date of Birth: [birth year]  Transition of Care [facility]) CM/SW Contact  Annice Needy, LCSW Phone Number: 07/02/2020, 4:42 PM  Clinical Narrative:    Message left for BJ [place] at [facility] regarding patient returning to the facility on IV abx.    Expected Discharge Plan: Skilled Nursing Facility Barriers to Discharge: Other (comment),SNF Covid (awaiting PCR results)  Expected Discharge Plan and Services Expected Discharge Plan: Skilled Nursing Facility In-house Referral: Clinical Social Work Discharge Planning Services: NA Post Acute Care Choice: Skilled Nursing Facility Living arrangements for the past 2 months: Skilled Nursing Facility                 DME Arranged: N/A DME Agency: NA       HH Arranged: NA HH Agency: NA         Social Determinants of Health (SDOH) Interventions    Readmission Risk Interventions No flowsheet data found.

## 2020-07-03 DIAGNOSIS — M86172 Other acute osteomyelitis, left ankle and foot: Secondary | ICD-10-CM | POA: Diagnosis not present

## 2020-07-03 DIAGNOSIS — I10 Essential (primary) hypertension: Secondary | ICD-10-CM | POA: Diagnosis not present

## 2020-07-03 DIAGNOSIS — I739 Peripheral vascular disease, unspecified: Secondary | ICD-10-CM

## 2020-07-03 DIAGNOSIS — I5032 Chronic diastolic (congestive) heart failure: Secondary | ICD-10-CM | POA: Diagnosis not present

## 2020-07-03 DIAGNOSIS — M869 Osteomyelitis, unspecified: Secondary | ICD-10-CM | POA: Diagnosis not present

## 2020-07-03 LAB — GLUCOSE, CAPILLARY
Glucose-Capillary: 39 mg/dL — CL (ref 70–99)
Glucose-Capillary: 44 mg/dL — CL (ref 70–99)
Glucose-Capillary: 61 mg/dL — ABNORMAL LOW (ref 70–99)
Glucose-Capillary: 90 mg/dL (ref 70–99)
Glucose-Capillary: 149 mg/dL — ABNORMAL HIGH (ref 70–99)
Glucose-Capillary: 159 mg/dL — ABNORMAL HIGH (ref 70–99)
Glucose-Capillary: 122 mg/dL — ABNORMAL HIGH (ref 70–99)

## 2020-07-03 LAB — CREATININE, SERUM
Creatinine, Ser: 0.84 mg/dL (ref 0.44–1.00)
GFR, Estimated: >60 mL/min (ref >60)

## 2020-07-03 MED ORDER — SODIUM CHLORIDE 0.9% FLUSH: 10.0000 mL | INTRAVENOUS | Status: DC | PRN

## 2020-07-03 MED ORDER — ASPIRIN 81 MG PO CHEW: 81.0000 mg | CHEWABLE_TABLET | Freq: Every day | ORAL | Status: DC

## 2020-07-03 MED ORDER — DEXTROSE 50 % IV SOLN
INTRAVENOUS | Status: AC
  Filled 2020-07-03: qty 50

## 2020-07-03 MED ORDER — DEXTROSE 50 % IV SOLN: 1.0000 | Freq: Once | INTRAVENOUS | Status: DC

## 2020-07-03 MED ORDER — LORAZEPAM 2 MG/ML IJ SOLN: 1.0000 mg | Freq: Once | INTRAMUSCULAR | Status: DC

## 2020-07-03 NOTE — NC FL2 (Deleted)
[place] MEDICAID FL2 LEVEL OF CARE SCREENING TOOL     IDENTIFICATION  Patient Name: Tara Flores Birthdate: Mar 19, 1936 Sex: female Admission Date (Current Location): 06/25/2020  [place] and [place] Number:  Aaron Edelman [id number] M Facility and Address:  [facility],  [address] [place] [address]      Provider Number: [id number]  Attending Physician Name and Address:  Catarina Hartshorn, MD  Relative Name and Phone Number:  Desirae Mancusi (daughter) Ph: [phone]    Current Level of Care: Hospital Recommended Level of Care: Skilled Nursing Facility Prior Approval Number:    Date Approved/Denied:   PASRR Number:    Discharge Plan: SNF    Current Diagnoses: Patient Active Problem List   Diagnosis Date Noted  . Chronic diastolic CHF (congestive heart failure) (HCC) 06/26/2020  . COPD (chronic obstructive pulmonary disease) (HCC) 06/26/2020  . Overweight (BMI 25.0-29.9) 06/26/2020  . Plantar ulcer of left foot, with unspecified severity (HCC)   . Foot osteomyelitis, left (HCC) 06/25/2020  . Tobacco use disorder 02/10/2017  . Pulmonary emphysema (HCC)   . Acute on chronic diastolic (congestive) heart failure (HCC) 02/09/2017  . Type 2 diabetes mellitus with hypoglycemia (HCC) 02/09/2017  . New onset a-fib (HCC) 11/20/2016  . Atrial fibrillation with RVR (HCC)   . COPD exacerbation (HCC)   . Goals of care, counseling/discussion   . Palliative care by specialist   . TIA (transient ischemic attack) 11/19/2016  . Diabetic foot ulcer (HCC) 10/10/2016  . Hypokalemia 10/10/2016  . Anemia due to chronic kidney disease 10/10/2016  . Cellulitis in diabetic foot (HCC) 10/09/2016  . Venous ulcer of left leg (HCC) 07/28/2016  . Dry skin dermatitis 07/28/2016  . Type 2 diabetes mellitus with hyperglycemia (HCC) 02/06/2015  . Hip fracture (HCC) 02/05/2015  . Closed right hip fracture (HCC) 02/05/2015  . Abnormal EKG 02/05/2015  . Tobacco abuse 02/05/2015  . DM2  (diabetes mellitus, type 2) (HCC) 02/05/2015  . Essential hypertension 02/05/2015  . Hypothyroidism 02/05/2015  . Coronary artery disease involving native coronary artery of native heart without angina pectoris     Orientation RESPIRATION BLADDER Height & Weight     Self,Time,Situation,Place  Normal External catheter Weight:  (refused, pt. was in recliner) Height:  5\' 2"  (157.5 cm)  BEHAVIORAL SYMPTOMS/MOOD NEUROLOGICAL BOWEL NUTRITION STATUS      Continent Diet (Heart healthy/carb modified. See d/c summary for updates.)  AMBULATORY STATUS COMMUNICATION OF NEEDS Skin   Extensive Assist Verbally Surgical wounds,Other (Comment) (puncture wound right groin)                       Personal Care Assistance Level of Assistance  Bathing,Feeding,Dressing Bathing Assistance: Maximum assistance Feeding assistance: Independent Dressing Assistance: Maximum assistance     Functional Limitations Info  Sight,Hearing,Speech Sight Info: Adequate Hearing Info: Adequate Speech Info: Adequate    SPECIAL CARE FACTORS FREQUENCY                       Contractures Contractures Info: Not present    Additional Factors Info  Code Status,Allergies,Psychotropic,Insulin Sliding Scale Code Status Info: DNR Allergies Info: Avelox (Moxifloxacin); Brethine (Terbutaline); Keflex (Cephalexin) Psychotropic Info: Xanax Insulin Sliding Scale Info: See discharge summary       Current Medications (07/03/2020):  This is the current hospital active medication list Current Facility-Administered Medications  Medication Dose Route Frequency Provider Last Rate Last Admin  . (feeding supplement) PROSource Plus liquid 30 mL  30 mL  Oral Daily Sherryll Burger, Pratik D, DO   30 mL at 07/02/20 0831  . 0.9 %  sodium chloride infusion  250 mL Intravenous PRN Maurilio Lovely D, DO 10 mL/hr at 07/02/20 1625 250 mL at 07/02/20 1625  . acetaminophen (TYLENOL) tablet 650 mg  650 mg Oral Q6H PRN Maurilio Lovely D, DO   650 mg at  07/02/20 2042   Or  . acetaminophen (TYLENOL) suppository 650 mg  650 mg Rectal Q6H PRN Sherryll Burger, Pratik D, DO      . ALPRAZolam Prudy Feeler) tablet 0.25 mg  0.25 mg Oral BID PRN Maurilio Lovely D, DO   0.25 mg at 07/02/20 2041  . aspirin chewable tablet 81 mg  81 mg Oral Daily Loyce Dys Sue-Ellen, PA   81 mg at 07/02/20 0836  . bisoprolol (ZEBETA) tablet 5 mg  5 mg Oral Daily Sherryll Burger, Pratik D, DO   5 mg at 07/02/20 7106  . calcium-vitamin D (OSCAL WITH D) 500-200 MG-UNIT per tablet 1 tablet  1 tablet Oral Daily Sherryll Burger, Pratik D, DO   1 tablet at 07/02/20 [phone]  . cefTRIAXone (ROCEPHIN) 2 g in sodium chloride 0.9 % 100 mL IVPB  2 g Intravenous Q24H Sherryll Burger, Pratik D, DO 200 mL/hr at 07/02/20 1627 2 g at 07/02/20 1627  . Chlorhexidine Gluconate Cloth 2 % PADS 6 each  6 each Topical Daily Cleora Fleet, MD   6 each at 07/01/20 0932  . collagenase (SANTYL) ointment   Topical BID Lucretia Roers, MD   Given at 07/02/20 2100  . dextrose 50 % solution 50 mL  1 ampule Intravenous Once Zierle-Ghosh, Asia B, DO      . diltiazem (CARDIZEM CD) 24 hr capsule 120 mg  120 mg Oral Daily Sherryll Burger, Pratik D, DO   120 mg at 07/02/20 0852  . enoxaparin (LOVENOX) injection 70 mg  70 mg Subcutaneous Q12H Shah, Pratik D, DO   70 mg at 07/03/20 0532  . furosemide (LASIX) tablet 60 mg  60 mg Oral BID Maurilio Lovely D, DO   60 mg at 07/02/20 1622  . insulin aspart (novoLOG) injection 0-5 Units  0-5 Units Subcutaneous QHS Maurilio Lovely D, DO   3 Units at 06/30/20 2114  . insulin aspart (novoLOG) injection 0-9 Units  0-9 Units Subcutaneous TID WC Shah, Pratik D, DO   3 Units at 07/02/20 1629  . insulin NPH Human (NOVOLIN N) injection 30 Units  30 Units Subcutaneous BID AC & HS Erick Blinks, MD   30 Units at 07/02/20 2100  . isosorbide mononitrate (IMDUR) 24 hr tablet 15 mg  15 mg Oral Daily Sherryll Burger, Pratik D, DO   15 mg at 07/02/20 8546  . levalbuterol (XOPENEX) nebulizer solution 0.63 mg  0.63 mg Inhalation Q6H PRN Sherryll Burger, Pratik D, DO    0.63 mg at 06/29/20 1049  . levothyroxine (SYNTHROID) tablet 137 mcg  137 mcg Oral Daily Maurilio Lovely D, DO   137 mcg at 07/03/20 0532  . LORazepam (ATIVAN) injection 1 mg  1 mg Intravenous Once Zierle-Ghosh, Asia B, DO      . multivitamin with minerals tablet 1 tablet  1 tablet Oral Daily Sherryll Burger, Pratik D, DO   1 tablet at 07/02/20 0908  . nicotine (NICODERM CQ - dosed in mg/24 hr) patch 7 mg  7 mg Transdermal Daily Sherryll Burger, Pratik D, DO   7 mg at 07/02/20 0848  . nitroGLYCERIN (NITROSTAT) SL tablet 0.4 mg  0.4 mg Sublingual Q5 min PRN Sherryll Burger,  Pratik D, DO      . ondansetron (ZOFRAN) tablet 4 mg  4 mg Oral Q6H PRN Sherryll Burger, Pratik D, DO       Or  . ondansetron (ZOFRAN) injection 4 mg  4 mg Intravenous Q6H PRN Sherryll Burger, Pratik D, DO      . pravastatin (PRAVACHOL) tablet 20 mg  20 mg Oral Daily Sherryll Burger, Pratik D, DO   20 mg at 07/02/20 0908  . sodium chloride flush (NS) 0.9 % injection 3 mL  3 mL Intravenous Q12H Shah, Pratik D, DO   3 mL at 07/02/20 2100  . sodium chloride flush (NS) 0.9 % injection 3 mL  3 mL Intravenous PRN Sherryll Burger, Pratik D, DO   3 mL at 07/02/20 0854  . spironolactone (ALDACTONE) tablet 12.5 mg  12.5 mg Oral BID Maurilio Lovely D, DO   12.5 mg at 07/02/20 2100  . vancomycin (VANCOCIN) IVPB 1000 mg/200 mL premix  1,000 mg Intravenous Q24H Erick Blinks, MD      . zinc sulfate capsule 220 mg  220 mg Oral Daily Sherryll Burger, Pratik D, DO   220 mg at 07/02/20 2549     Discharge Medications: Please see discharge summary for a list of discharge medications.  Relevant Imaging Results:  Relevant Lab Results:   Additional Information SSN: [id number].Vancomycin 1000 mg IV every 24 hours and Ceftriaxone 2000 mg IV every 24 hours.Last day of therapy for Ceftriaxone: 08/05/2020  Vancomycin: 08/12/2020  Sandeep Radell, Juleen China, LCSW

## 2020-07-03 NOTE — Progress Notes (Signed)
Called receiving facility, gave report to brandy LPN.

## 2020-07-03 NOTE — Progress Notes (Signed)
Pt refused her lunch time insulin. Stated she does take insulin for a sugar of 122. I educated her on the doctors order of 1 unit of insulin. Pt still refused.

## 2020-07-03 NOTE — [place] FL2 (Addendum)
[place] MEDICAID FL2 LEVEL OF CARE SCREENING TOOL     IDENTIFICATION  Patient Name: Tara Flores Birthdate: 1935-12-31 Sex: female Admission Date (Current Location): 06/25/2020  [place] and [place] Number:  Aaron Edelman [id number] Facility and Address:  [facility],  [address] [place] [address]      Provider Number: [id number]  Attending Physician Name and Address:  Catarina Hartshorn, MD  Relative Name and Phone Number:  Mannat Benedetti (daughter) Ph: [phone]    Current Level of Care: Hospital Recommended Level of Care: Skilled Nursing Facility Prior Approval Number:    Date Approved/Denied:   PASRR Number:    Discharge Plan: SNF    Current Diagnoses: Patient Active Problem List   Diagnosis Date Noted  . Chronic diastolic CHF (congestive heart failure) (HCC) 06/26/2020  . COPD (chronic obstructive pulmonary disease) (HCC) 06/26/2020  . Overweight (BMI 25.0-29.9) 06/26/2020  . Plantar ulcer of left foot, with unspecified severity (HCC)   . Foot osteomyelitis, left (HCC) 06/25/2020  . Tobacco use disorder 02/10/2017  . Pulmonary emphysema (HCC)   . Acute on chronic diastolic (congestive) heart failure (HCC) 02/09/2017  . Type 2 diabetes mellitus with hypoglycemia (HCC) 02/09/2017  . New onset a-fib (HCC) 11/20/2016  . Atrial fibrillation with RVR (HCC)   . COPD exacerbation (HCC)   . Goals of care, counseling/discussion   . Palliative care by specialist   . TIA (transient ischemic attack) 11/19/2016  . Diabetic foot ulcer (HCC) 10/10/2016  . Hypokalemia 10/10/2016  . Anemia due to chronic kidney disease 10/10/2016  . Cellulitis in diabetic foot (HCC) 10/09/2016  . Venous ulcer of left leg (HCC) 07/28/2016  . Dry skin dermatitis 07/28/2016  . Type 2 diabetes mellitus with hyperglycemia (HCC) 02/06/2015  . Hip fracture (HCC) 02/05/2015  . Closed right hip fracture (HCC) 02/05/2015  . Abnormal EKG 02/05/2015  . Tobacco abuse 02/05/2015  . DM2  (diabetes mellitus, type 2) (HCC) 02/05/2015  . Essential hypertension 02/05/2015  . Hypothyroidism 02/05/2015  . Coronary artery disease involving native coronary artery of native heart without angina pectoris     Orientation RESPIRATION BLADDER Height & Weight     Self,Time,Situation,Place  Normal External catheter Weight:  (refused, pt. was in recliner) Height:  5\' 2"  (157.5 cm)  BEHAVIORAL SYMPTOMS/MOOD NEUROLOGICAL BOWEL NUTRITION STATUS      Continent Diet (Heart healthy/carb modified. See d/c summary for updates.)  AMBULATORY STATUS COMMUNICATION OF NEEDS Skin Isolation Precautions  Extensive Assist Verbally Surgical wounds,Other (Comment) (puncture wound right groin)         COVID+ 07/01/20              Personal Care Assistance Level of Assistance  Bathing,Feeding,Dressing Bathing Assistance: Maximum assistance Feeding assistance: Independent Dressing Assistance: Maximum assistance     Functional Limitations Info  Sight,Hearing,Speech Sight Info: Adequate Hearing Info: Adequate Speech Info: Adequate    SPECIAL CARE FACTORS FREQUENCY                       Contractures Contractures Info: Not present    Additional Factors Info  Isolation Precautions Code Status Info: DNR Allergies Info: Avelox (Moxifloxacin); Brethine (Terbutaline); Keflex (Cephalexin) Psychotropic Info: Xanax Insulin Sliding Scale Info: See discharge summary Isolation Precautions Info: COVID+ 07/01/20     Current Medications (07/03/2020):  This is the current hospital active medication list Current Facility-Administered Medications  Medication Dose Route Frequency Provider Last Rate Last Admin  . (feeding supplement) PROSource Plus liquid 30 mL  30 mL Oral Daily Sherryll Burger, Pratik D, DO   30 mL at 07/03/20 0920  . 0.9 %  sodium chloride infusion  250 mL Intravenous PRN Maurilio Lovely D, DO 10 mL/hr at 07/02/20 1625 250 mL at 07/02/20 1625  . acetaminophen (TYLENOL) tablet 650 mg  650 mg Oral  Q6H PRN Maurilio Lovely D, DO   650 mg at 07/02/20 2042   Or  . acetaminophen (TYLENOL) suppository 650 mg  650 mg Rectal Q6H PRN Sherryll Burger, Pratik D, DO      . ALPRAZolam Prudy Feeler) tablet 0.25 mg  0.25 mg Oral BID PRN Maurilio Lovely D, DO   0.25 mg at 07/02/20 2041  . aspirin chewable tablet 81 mg  81 mg Oral Daily Loyce Dys Sue-Ellen, PA   81 mg at 07/03/20 7353  . bisoprolol (ZEBETA) tablet 5 mg  5 mg Oral Daily Sherryll Burger, Pratik D, DO   5 mg at 07/03/20 2992  . calcium-vitamin D (OSCAL WITH D) 500-200 MG-UNIT per tablet 1 tablet  1 tablet Oral Daily Sherryll Burger, Pratik D, DO   1 tablet at 07/03/20 4268  . cefTRIAXone (ROCEPHIN) 2 g in sodium chloride 0.9 % 100 mL IVPB  2 g Intravenous Q24H Sherryll Burger, Pratik D, DO 200 mL/hr at 07/02/20 1627 2 g at 07/02/20 1627  . Chlorhexidine Gluconate Cloth 2 % PADS 6 each  6 each Topical Daily Cleora Fleet, MD   6 each at 07/01/20 0932  . collagenase (SANTYL) ointment   Topical BID Lucretia Roers, MD   Given at 07/03/20 1142  . dextrose 50 % solution 50 mL  1 ampule Intravenous Once Zierle-Ghosh, Asia B, DO      . diltiazem (CARDIZEM CD) 24 hr capsule 120 mg  120 mg Oral Daily Sherryll Burger, Pratik D, DO   120 mg at 07/03/20 0923  . enoxaparin (LOVENOX) injection 70 mg  70 mg Subcutaneous Q12H Shah, Pratik D, DO   70 mg at 07/03/20 0532  . furosemide (LASIX) tablet 60 mg  60 mg Oral BID Maurilio Lovely D, DO   60 mg at 07/03/20 3419  . insulin aspart (novoLOG) injection 0-5 Units  0-5 Units Subcutaneous QHS Maurilio Lovely D, DO   3 Units at 06/30/20 2114  . insulin aspart (novoLOG) injection 0-9 Units  0-9 Units Subcutaneous TID WC Sherryll Burger, Pratik D, DO   2 Units at 07/03/20 6222  . insulin NPH Human (NOVOLIN N) injection 30 Units  30 Units Subcutaneous BID AC & HS Erick Blinks, MD   30 Units at 07/02/20 2100  . isosorbide mononitrate (IMDUR) 24 hr tablet 15 mg  15 mg Oral Daily Sherryll Burger, Pratik D, DO   15 mg at 07/03/20 9798  . levalbuterol (XOPENEX) nebulizer solution 0.63 mg  0.63 mg  Inhalation Q6H PRN Sherryll Burger, Pratik D, DO   0.63 mg at 06/29/20 1049  . levothyroxine (SYNTHROID) tablet 137 mcg  137 mcg Oral Daily Maurilio Lovely D, DO   137 mcg at 07/03/20 0532  . LORazepam (ATIVAN) injection 1 mg  1 mg Intravenous Once Zierle-Ghosh, Asia B, DO      . multivitamin with minerals tablet 1 tablet  1 tablet Oral Daily Sherryll Burger, Pratik D, DO   1 tablet at 07/03/20 0920  . nicotine (NICODERM CQ - dosed in mg/24 hr) patch 7 mg  7 mg Transdermal Daily Sherryll Burger, Pratik D, DO   7 mg at 07/03/20 0931  . nitroGLYCERIN (NITROSTAT) SL tablet 0.4 mg  0.4 mg Sublingual Q5 min  PRN Sherryll Burger, Pratik D, DO      . ondansetron [facility]) tablet 4 mg  4 mg Oral Q6H PRN Sherryll Burger, Pratik D, DO       Or  . ondansetron (ZOFRAN) injection 4 mg  4 mg Intravenous Q6H PRN Sherryll Burger, Pratik D, DO      . pravastatin (PRAVACHOL) tablet 20 mg  20 mg Oral Daily Sherryll Burger, Pratik D, DO   20 mg at 07/03/20 9381  . sodium chloride flush (NS) 0.9 % injection 3 mL  3 mL Intravenous Q12H Shah, Pratik D, DO   3 mL at 07/02/20 2100  . sodium chloride flush (NS) 0.9 % injection 3 mL  3 mL Intravenous PRN Sherryll Burger, Pratik D, DO   3 mL at 07/02/20 0854  . spironolactone (ALDACTONE) tablet 12.5 mg  12.5 mg Oral BID Maurilio Lovely D, DO   12.5 mg at 07/03/20 0175  . vancomycin (VANCOCIN) IVPB 1000 mg/200 mL premix  1,000 mg Intravenous Q24H Erick Blinks, MD      . zinc sulfate capsule 220 mg  220 mg Oral Daily Sherryll Burger, Pratik D, DO   220 mg at 07/03/20 0920     Discharge Medications: Please see discharge summary for a list of discharge medications.  Relevant Imaging Results:  Relevant Lab Results:   Additional Information SSN: [id number].Vancomycin 1000 mg IV every 24 hours and Ceftriaxone 2000 mg IV every 24 hours.Last day of therapy for Ceftriaxone: 08/05/2020  Vancomycin: 08/12/2020  Khadar Monger, Juleen China, LCSW

## 2020-07-03 NOTE — TOC Transition Note (Signed)
Transition of Care [facility]) - CM/SW Discharge Note   Patient Details  Name: AYLIN RHOADS MRN: [id number] Date of Birth: 06-16-1936  [facility]) CM/SW Contact:  Annice Needy, LCSW Phone Number: 07/03/2020, 10:00 AM   Clinical Narrative:    Spoke with Melissa at [facility] and advised of discharge today. Melissa has printed d/c summary. Aware of IV abx.  Left message for daughter regarding discharge.    Final next level of care: Skilled Nursing Facility Barriers to Discharge: Other (comment),SNF Covid (awaiting PCR results)   Patient Goals and CMS Choice Patient states their goals for this hospitalization and ongoing recovery are:: [facility] to [facility].gov Compare Post Acute Care list provided to:: Patient Choice offered to / list presented to : Patient  Discharge Placement                       Discharge Plan and Services In-house Referral: Clinical Social Work Discharge Planning Services: NA Post Acute Care Choice: Skilled Nursing Facility          DME Arranged: N/A DME Agency: NA       HH Arranged: NA HH Agency: NA        Social Determinants of Health (SDOH) Interventions     Readmission Risk Interventions No flowsheet data found.

## 2020-07-03 NOTE — Discharge Summary (Signed)
Physician Discharge Summary  Tara Flores NKN:[id number] DOB: 11-01-1935 DOA: 06/25/2020  PCP: Hilbert Corrigan, MD  Admit date: 06/25/2020 Discharge date: 07/03/2020  Admitted From: SNF Disposition:  SNF  Recommendations for Outpatient Follow-up:  1. Follow up with PCP in 1-2 weeks 2. Please obtain BMP/CBC in one week 3. Follow-up with general surgery, Dr. Constance Haw as needed for further surgical management of her left foot if desired 4. Palliative care services should follow patient upon return to skilled nursing facility 5. Patient will be discharged with PICC line and long-term IV antibiotics as noted below 6. Follow-up with interventional radiology, Dr. Earleen Newport in 6 weeks 7. Continue dressing changes twice daily  Discharge Condition: Stable CODE STATUS: DNR Diet recommendation: Heart healthy, carb modified  Brief/Interim Summary: Tara Flores is a 85 y.o. female with PMH significant for DM2, chronic diastolic CHF, atrial fibrillation, peripheral vascular disease, ongoing tobacco abuse and status post partial left foot transmetatarsal amputation on 4/18 who is a long-term care resident at [facility].  Patient was sent to the ED on 1/4 with grossly worsening redness and swelling in her left foot going up her leg.  In the ED, x-ray of left foot noted osteomyelitis of the left cuboid.   Admitted under hospitalist service.   General surgery was consulted for possible need of amputation.  However patient is not yet clear if she wants to proceed with amputation.  Palliative care consultation was obtained.  1/6, patient underwent bedside debridement by general surgery of left foot stump. 1/6, CT angiogram of aorta and bifemoral showed multilevel peripheral artery disease including multifocal high-grade SFA stenosis bilaterally. 1/7, patient had angiogram done by IR she underwent treatment of left SFA stenosis and left peroneal artery occlusion.  She had drug eluting ballooning of the  left SFA with 0% residual stenting and PTA of the left peroneal artery restoring 100% improved flow.  Discharge Diagnoses:  Principal Problem:   Foot osteomyelitis, left ([place]) Active Problems:   DM2 (diabetes mellitus, type 2) ([place])   Essential hypertension   Hypothyroidism   Anemia due to chronic kidney disease   Atrial fibrillation with RVR ([facility])   Tobacco use disorder   Chronic diastolic CHF (congestive heart failure) ([facility])   COPD (chronic obstructive pulmonary disease) ([facility])   Overweight (BMI 25.0-29.9)   Plantar ulcer of left foot, with unspecified severity ([place])  Left foot osteomyelitis of the cuboid bone History of partial left foot transmetatarsal amputation -General surgery consulted.  Pt adamantly against amputation.  -Currently on IV Rocephin.   MRI confirms osteomyelitis Patient does not want to have surgical management at this time -Discussed with infectious disease recommended 6 weeks of vancomycin and ceftriaxone -PICC line placed for antibiotic therapy -Follow-up with general surgery in the next few weeks if patient reconsiders surgical management -Palliative care to follow patient at skilled nursing facility to further address goals of care  Peripheral vascular disease Ongoing tobacco use -1/7, patient had angiogram done by IR she underwent treatment of left SFA stenosis and left peroneal artery occlusion.  She had drug eluting ballooning of the left SFA with 0% residual stenting and PTA of the left peroneal artery restoring 100% improved flow. -Counseled to quit smoking. -She is to continue on aspirin and eliquis -Follow-up with interventional radiology, Dr. Earleen Newport in 6 weeks  Covid Infection -patient does not appear to be symptomatic at this time.  Would continue supportive treatment.   Chronic diastolic CHF Essential hypertension -Currently blood pressure is stable, CHF  stable -Home meds include Bisoprolol 5 mg daily, Cardizem 120 mg daily, Lasix 80 mg  twice daily, Imdur 15 mg daily, Aldactone 25 mg twice daily.  Atrial fibrillation -Continue bisoprolol, Cardizem.  Continue anticoagulation with Eliquis.  Type 2 diabetes mellitus -A1c 9.6 on 1/4 -Home meds include Exenatide 10 mcg twice daily, NPH 40 units and 20 units, sliding scale insulin, Jardiance 10 mg daily.    These should be resumed on discharge   Hypothyroidism -Continue Synthroid.  COPD  -Stable.    Anxiety  -Continue Xanax 0.25 mg twice daily,   Discharge Instructions  Discharge Instructions    [facility] pharmacist to adjust dose for Vancomycin, Aminoglycosides and other anti-infective therapies as requested by physician.   Complete by: As directed    [facility] to provide Cath Flo 2m   Complete by: As directed    Administer for PICC line occlusion and as ordered by physician for other access device issues.   Anaphylaxis Kit: Provided to treat any anaphylactic reaction to the medication being provided to the patient if First Dose or when requested by physician   Complete by: As directed    Epinephrine 162mml vial / amp: Administer 0.65m67m0.65ml74mubcutaneously once for moderate to severe anaphylaxis, nurse to call physician and pharmacy when reaction occurs and call 911 if needed for immediate care   Diphenhydramine 50mg81mIV vial: Administer 25-50mg 69mM PRN for first dose reaction, rash, itching, mild reaction, nurse to call physician and pharmacy when reaction occurs   Sodium Chloride 0.9% NS 500ml I14mdminister if needed for hypovolemic blood pressure drop or as ordered by physician after call to physician with anaphylactic reaction   Change dressing on IV access line weekly and PRN   Complete by: As directed    Flush IV access with Sodium Chloride 0.9% and Heparin 10 units/ml or 100 units/ml   Complete by: As directed    Home infusion instructions - [facility]   Complete by: As directed    Instructions: Flush IV  access with Sodium Chloride 0.9% and Heparin 10units/ml or 100units/ml   Change dressing on IV access line: Weekly and PRN   Instructions Cath Flo 2mg: Ad365mister for PICC Line occlusion and as ordered by physician for other access device   [facility] pharmacist to adjust dose for: Vancomycin, Aminoglycosides and other anti-infective therapies as requested by physician   Method of administration may be changed at the discretion of home infusion pharmacist based upon assessment of the patient and/or caregiver's ability to self-administer the medication ordered   Complete by: As directed      Allergies as of 07/03/2020      Reactions   Avelox [moxifloxacin]    Brethine [terbutaline]    Made patient confused   Keflex [cephalexin] Nausea Only   Loss of appetite      Medication List    STOP taking these medications   doxycycline 100 MG tablet Commonly known as: VIBRA-TABS   lisinopril 2.5 MG tablet Commonly known as: ZESTRIL     TAKE these medications   acetaminophen 325 MG tablet Commonly known as: TYLENOL Take 650 mg by mouth every 4 (four) hours as needed for mild pain.   ALPRAZolam 0.25 MG tablet Commonly known as: XANAX Take 1 tablet (0.25 mg total) by mouth 2 (two) times daily as needed for anxiety.   apixaban 5 MG Tabs tablet Commonly known as: ELIQUIS Take 1 tablet (5 mg total) by mouth 2 (two)  times daily.   aspirin 81 MG chewable tablet Chew 1 tablet (81 mg total) by mouth daily. Start taking on: July 04, 2020   BABY SHAMPOO EX Apply 1 application topically daily. Apply to eyelids every morning for irritation   bisoprolol 5 MG tablet Commonly known as: ZEBETA Take 1 tablet (5 mg total) by mouth daily.   Byetta 10 MCG Pen 10 MCG/0.04ML Sopn injection Generic drug: exenatide Inject 10 mcg into the skin in the morning and at bedtime.   Calcium Carb-Cholecalciferol 500-400 MG-UNIT Tabs Take 1 tablet by mouth daily.   cefTRIAXone  IVPB Commonly  known as: ROCEPHIN Inject 2 g into the vein daily. Indication:  osteomyelitis First Dose: Yes Last Day of Therapy:  2/14/20222 Labs - Once weekly:  CBC/D and BMP, Labs - Every other week:  ESR and CRP Method of administration: IV Push Method of administration may be changed at the discretion of home infusion pharmacist based upon assessment of the patient and/or caregiver's ability to self-administer the medication ordered.   collagenase ointment Commonly known as: SANTYL Apply topically 2 (two) times daily.   Combivent Respimat 20-100 MCG/ACT Aers respimat Generic drug: Ipratropium-Albuterol   diltiazem 240 MG 24 hr capsule Commonly known as: TIAZAC Take 120 mg by mouth daily.   feeding supplement (PRO-STAT SUGAR FREE 64) Liqd Take 30 mLs by mouth daily.   furosemide 20 MG tablet Commonly known as: Lasix Take 3 tablets (60 mg total) by mouth 2 (two) times daily. What changed: how much to take   insulin lispro 100 UNIT/ML injection Commonly known as: HUMALOG Inject 5-10 Units into the skin 3 (three) times daily as needed. Take 5 units every evening and as needed with meals per sliding scale; sliding scale - blood glucose 71-150 give 0 units, 151-200 give 2 units, 201-250 give 4 units. 251-300 give 6 units, 301-350 give 8 units, and for 351-400 give 10 units and call provider   insulin NPH Human 100 UNIT/ML injection Commonly known as: NOVOLIN N Inject 20-30 Units into the skin 2 (two) times daily. 40units in the morning and 20 units bedtime   isosorbide mononitrate 30 MG 24 hr tablet Commonly known as: IMDUR Take 0.5 tablets (15 mg total) by mouth daily.   Jardiance 10 MG Tabs tablet Generic drug: empagliflozin Take 10 mg by mouth daily.   multivitamin with minerals Tabs tablet Take 1 tablet by mouth daily.   nitroGLYCERIN 0.4 MG SL tablet Commonly known as: NITROSTAT Place 1 tablet (0.4 mg total) under the tongue every 5 (five) minutes as needed for chest pain.    pravastatin 20 MG tablet Commonly known as: PRAVACHOL Take 20 mg by mouth daily.   ProAir HFA 108 (90 Base) MCG/ACT inhaler Generic drug: albuterol   spironolactone 25 MG tablet Commonly known as: ALDACTONE Take 0.5 tablets (12.5 mg total) by mouth 2 (two) times daily. What changed: how much to take   Synthroid 137 MCG tablet Generic drug: levothyroxine Take 137 mcg by mouth daily.   vancomycin  IVPB Inject 1,000 mg into the vein daily. Indication:  osteomyelitis First Dose: Yes Last Day of Therapy:  08/12/2020 Labs - Sunday/Monday:  CBC/D, BMP, and vancomycin trough. Labs - Thursday:  BMP and vancomycin trough Labs - Every other week:  ESR and CRP Method of administration:Elastomeric Method of administration may be changed at the discretion of the patient and/or caregiver's ability to self-administer the medication ordered.   VITAMIN C PO Take 500 mg by mouth 2 (two)  times daily.   zinc sulfate 220 (50 Zn) MG capsule Take 220 mg by mouth daily.            Discharge Care Instructions  (From admission, onward)         Start     Ordered   07/02/20 0000  Change dressing on IV access line weekly and PRN  (Home infusion instructions - [facility] )        07/02/20 2107          Allergies  Allergen Reactions  . Avelox [Moxifloxacin]   . Brethine [Terbutaline]     Made patient confused  . Keflex [Cephalexin] Nausea Only    Loss of appetite    Consultations:  Interventional radiology  General surgery  Palliative care   Procedures/Studies: IR Angiogram Extremity Left  Result Date: 07/01/2020 INDICATION: 85 year old female with left lower extremity wound dehiscence and PA D presents for angiogram and possible intervention EXAM: ULTRASOUND-GUIDED ACCESS RIGHT COMMON FEMORAL ARTERY PELVIC AND LEFT LOWER EXTREMITY ANGIOGRAM LASER ATHERECTOMY LEFT SFA AND LEFT PERONEAL ARTERY DRUG-ELUTING BALLOON ANGIOPLASTY LEFT SFA BALLOON ANGIOPLASTY LEFT  PERONEAL ARTERY MEDICATIONS: 8000 units IV heparin. ANESTHESIA/SEDATION: Moderate (conscious) sedation was employed during this procedure. A total of Versed 3.0 mg and Fentanyl 150 mcg was administered intravenously. Moderate Sedation Time: 310 minutes. The patient's level of consciousness and vital signs were monitored continuously by radiology nursing throughout the procedure under my direct supervision. CONTRAST:  141 CC CONTRAST FLUOROSCOPY TIME:  Fluoroscopy Time: 28 minutes 0 seconds (104.8 mGy). COMPLICATIONS: None PROCEDURE: Informed consent was obtained from the patient following explanation of the procedure, risks, benefits and alternatives. The patient understands, agrees and consents for the procedure. All questions were addressed. A time out was performed prior to the initiation of the procedure. Maximal barrier sterile technique utilized including caps, mask, sterile gowns, sterile gloves, large sterile drape, hand hygiene, and Betadine prep. Ultrasound survey of the right inguinal region was performed with images stored and sent to PACs, confirming patency of the vessel. 1% lidocaine was used for local anesthesia. Small stab incision was made. Blunt dissection was performed. A micropuncture needle was used access the right common femoral artery under ultrasound. With excellent arterial blood flow returned, and an .018 micro wire was passed through the needle, observed enter the abdominal aorta under fluoroscopy. The needle was removed, and a micropuncture sheath was placed over the wire. The inner dilator and wire were removed, and an 035 Bentson wire was advanced under fluoroscopy into the abdominal aorta. The sheath was removed and a standard 5 [place] vascular sheath was placed. The dilator was removed and the sheath was flushed. Omni Flush catheter was advanced to the lower abdominal aorta. Angiogram was performed. Bentson wire was navigated into the left iliac system with the use of the Omni Flush  catheter. Omni Flush catheter was removed and a 035 quick cross crossing catheter was advanced to the left common femoral artery. Angiogram of the left lower extremity was performed. Rose in wire was then placed through the crossing catheter and sheath exchange was made for a 6 French 45 cm straight tip to room 0 destination sheath. The sheath was position in the proximal superficial femoral artery, and the introducer and wire were removed. Given the disease segment of the peroneal artery contributing to the dorsalis pedis, we elected to use ultrasound-guided puncture distally for a retrograde approach. 1% lidocaine was used for local anesthesia. Ultrasound-guided access of the left dorsalis pedis was  then performed with a V18 wire advanced through the needle retrograde. The wire was advanced to the proximal peroneal artery with the assistance of 018 crossing catheter. The wire and catheter were in a subintimal channel within the proximal peroneal artery. Catheter was withdrawn. A whisper wire was then used in attempt to find a luminal channel within the mid segment of the peroneal artery after withdrawal of the crossing catheter. We then used a Glidewire and an angled quick cross crossing catheter through the 6 [place] sheath. The catheter was navigated to the origin of the peroneal artery. 12 g crossing wire was then passed through the angle crossing catheter antegrade through the peroneal artery with a distal position achieved. 2 mm balloon angioplasty was then performed in the proximal segment in order to create a connecting channel from the subintimal channel and the lumen of the proximal peroneal artery. We were then successful in navigating the retrograde directed 018 system into the true lumen of the peroneal artery and the tibioperoneal trunk. The quick cross catheter was then advanced retrograde to an angled vertebral catheter. A rendezvous was then performed with retrograde passage of a whisper wire. This  wire was then ultimately externalized from both the pedal access and the right femoral sheath access. 014 crossing catheter was then passed distally over the wire. With a distal position achieved in the dorsalis pedis the wire was removed. Antegrade whisper wire was placed from the top down. Once the whisper wire was in position we initiated laser atherectomy of first the femoral segment, and then the tibial segment. 1.5 mm device was used for both the femoral and the tibial segment. After atherectomy was completed, balloon angioplasty was performed of the tibial segment, with 2.5 mm balloon angioplasty starting distally. We increased the diameter to a 3 mm segment proximally. Given some slow flow through the distal segment, repeat 2 mm balloon angioplasty was performed in the distal peroneal artery crossing the ankle joint into the dorsalis pedis. Drug-eluting balloon angioplasty was then performed in the femoropopliteal segment, with 5 mm x 120 mm. We observed a 3 minutes interval. After treatment of both segments, there was excellent response of the femoral segment with 0 residual stenosis. There was restoration of flow through the previously occluded tibial/peroneal segment into the dorsalis pedis. A non flow limiting dissection was present. We confirmed a palpable pulse at the foot in the dorsalis pedis segment and elected to withdraw at this point. The destination sheath was exchanged for a short 6 French sheath at the right common femoral artery access point. We attempted to deploy a Celt hemostasis device, which ultimately failed and was withdrawn. Manual pressure of 25 minutes was used for hemostasis with a pressure dressing applied. Patient tolerated the procedure well and remained hemodynamically stable throughout. Blood loss was estimated 50 cc. FINDINGS: Ultrasound demonstrates significant calcifications of the right common femoral artery though patent. Pelvic angiogram demonstrates mild atherosclerosis  of the lower abdominal aorta. Bilateral iliac arteries with mild atherosclerosis and no high-grade stenosis or occlusion. Hypogastric arteries patent bilaterally. Bilateral external iliac arteries patent with mild to moderate calcified plaque. Bilateral common femoral arteries patent. Proximal right SFA and profunda femoris patent. Angiogram left lower extremity demonstrates patent profunda femoris and thigh branches. Moderate to advanced atherosclerotic changes of the left SFA with high-grade 80% stenosis in the adductor canal. Popliteal artery patent without significant stenosis. Posterior tibial artery is patent with mild atherosclerosis. There is a paucity of flow from the posterior tibial artery  into the common plantar artery. Variant anatomy of the tibioperoneal trunk with the TP trunk contributing to anterior tibial artery and peroneal artery. Peroneal artery is significantly disease with distal contribution to the dorsalis pedis. Anterior tibial artery is occluded in the mid segment with collateral flow. After treatment of the SFA there is 0% residual stenosis in the abductor canal. After treatment of the tibial segment, target peroneal artery, there is 100% improved perfusion into the dorsalis pedis. Non flow limiting dissection present in the TP trunk and peroneal artery, with a palpable pulse at the ankle. IMPRESSION: Status post ultrasound guided access right common femoral artery and left dorsalis pedis for treatment of multi segment left-sided PAD contributing to poor wound healing, with laser atherectomy of both the femoral segment and occluded left peroneal artery, drug-eluting balloon angioplasty of SFA, and PTA of left peroneal artery restoring palpable dorsalis pedis pulse. Variant anatomy observed, with the TP trunk contributing to peroneal artery and anterior tibial artery, and the peroneal artery terminating in the dorsalis pedis. Signed, Dulcy Fanny. Dellia Nims, RPVI [facility] Rad[facility] Electronically Signed   By: Corrie Mckusick D.O.   On: 07/01/2020 09:10   CT ANGIO AO+BIFEM W & OR WO CONTRAST  Result Date: 06/27/2020 CLINICAL DATA:  85 year old female with a history left foot infection EXAM: CT ANGIOGRAPHY OF ABDOMINAL AORTA WITH ILIOFEMORAL RUNOFF TECHNIQUE: Multidetector CT imaging of the abdomen, pelvis and lower extremities was performed using the standard protocol during bolus administration of intravenous contrast. Multiplanar CT image reconstructions and MIPs were obtained to evaluate the vascular anatomy. CONTRAST:  89m OMNIPAQUE IOHEXOL 350 MG/ML SOLN COMPARISON:  Noninvasive 06/25/2020 FINDINGS: VASCULAR Aorta: Atherosclerotic changes of the lower thoracic aorta. Diameter of the aorta at the hiatus measures 2 cm. No dissection. No ulcerated plaque or pedunculated plaque. No periaortic inflammatory changes or fluid. Circumferential atherosclerotic calcifications of the abdominal aorta. Celiac: Celiac artery is patent, with configuration at the origin compatible with overlying compression of the cruise. SMA: SMA patent with mild atherosclerosis at the origin. Renals: - Right: Single right renal artery with mild to moderate atherosclerotic changes at the origin. No evidence of high-grade stenosis. - Left: Single left renal artery with mild atherosclerosis at the origin. IMA: IMA is patent. Right lower extremity: Moderate atherosclerotic changes of the right iliac system. No dissection or aneurysm of the common iliac artery. No high-grade stenosis or occlusion. Hypogastric artery is patent. External iliac artery is patent with mild atherosclerosis. Common femoral artery with mild atherosclerotic changes with calcified plaque on the posterior wall. Profunda femoris and the thigh branches are patent. Advanced atherosclerotic changes of the right SFA with multifocal calcified and soft plaque. Short segment critical stenosis in the proximal third.  Additional high-grade stenosis in the mid third. At least 50% narrowing within the adductor canal. Popliteal artery is patent with moderate atherosclerotic changes. Proximal anterior tibial artery patent. Tibioperoneal trunk is patent with calcified plaque in the mid segment. Anterior tibial artery patent from the origin to the ankle with mild to moderate calcified plaque. Posterior tibial artery is small caliber with calcifications, appears to be occluded distally. Peroneal artery is patent proximally, small caliber, with some calcifications. Uncertain regarding patency distally. Left lower extremity: Moderate atherosclerotic changes of the left iliac system. No aneurysm, dissection, high-grade stenosis or occlusion. Hypogastric is patent with advanced atherosclerotic changes at the origin. External iliac artery patent with moderate atherosclerotic changes. Common femoral artery patent with calcified plaque on the posterior  wall. Profunda femoris and thigh branches patent. Advanced atherosclerotic changes of the left SFA with multifocal narrowing. 50% narrowing short segment proximal third. Critical stenosis in the mid third at the abductor canal. Tandem critical stenosis in the above knee popliteal artery. Popliteal artery patent with circumferential mild plaque. Patency of the anterior tibial artery uncertain given the degree of calcifications. Tibioperoneal trunk demonstrates at least moderate atherosclerotic calcifications and appears patent to the presumed site of the bifurcation. The length of the posterior tibial artery is not well visualized, except for linear calcifications in the expected location presumably occluded. Peroneal artery patency is uncertain. Veins: Unremarkable IVC and iliac veins. Engorged left great saphenous vein with tortuous accessory saphenous of the thigh associated with multiple engorged varicosities of the left calf. On the right there is a smaller network of superficial venous  varicosities associated with the great saphenous network, including distal thigh and the calf. Perforator veins are evident associated with the peroneal vein of the calf. Review of the MIP images confirms the above findings. NON-VASCULAR Lower chest: No acute finding at the lung bases Hepatobiliary: Unremarkable appearance of the liver. Gallbladder is contracted with possible wall thickening. No radiopaque stones within the gallbladder lumen. No inflammatory changes or pericholecystic fluid. Pancreas: Unremarkable. Spleen: Unremarkable. Adrenals/Urinary Tract: - Right adrenal gland: Unremarkable - Left adrenal gland: Unremarkable. - Right kidney: No hydronephrosis, nephrolithiasis, inflammation, or ureteral dilation. No focal lesion. - Left Kidney: No hydronephrosis, nephrolithiasis, inflammation, or ureteral dilation. No focal lesion. - Urinary Bladder: Urinary bladder partially distended. Stomach/Bowel: - Stomach: Unremarkable. - Small bowel: No abnormally distended small bowel. Partially fecalized distal small bowel. No focal inflammatory changes or air-fluid levels. No mesenteric inflammatory changes. - Appendix: Appendix is not visualized, however, no inflammatory changes are present adjacent to the cecum to indicate an appendicitis. - Colon: Colonic diverticula, without associated inflammatory changes. Lymphatic: Enlarged lymph nodes of the left greater than right inguinal nodal stations. Small lymph nodes along the left iliac nodal chain, associated with the left inguinal adenopathy and the left foot wound. Mesenteric: No free fluid or air. No mesenteric adenopathy. Reproductive: Unremarkable uterus/adnexa. Other: Fat containing umbilical hernia. Nonspecific pelvic floor laxity Musculoskeletal: Surgical changes of right hip fixation. Degenerative changes of the spine. Compression fracture of L1 with less than 50% height loss. Indeterminate age. Circumferential soft tissue swelling of the left greater than  right calf. Ulceration evident at the plantar aspect of the left foot IMPRESSION: Multi segment PA D including: -aortic atherosclerosis.  Aortic Atherosclerosis (ICD10-I70.0). -moderate bilateral iliac arterial disease without high-grade stenosis or occlusion of the CIA or EIA. Likely high-grade stenosis of left hypogastric origin. -advanced bilateral femoropopliteal disease, with multifocal high-grade SFA stenoses bilaterally. -bilateral tibial arterial disease. On the right the AT appears patent from the origin to the ankle, with uncertain patency of the PT and the peroneal artery. On the left, the peroneal artery is partially patent, with uncertain patency of the AT, PT, and distal peroneal artery. 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 Bilateral renal arterial disease without high-grade stenosis or occlusion. Evidence of bilateral, left greater than right venous insufficiency of the lower extremities. Left inguinal adenopathy and associated left iliac chain lymph nodes. These are presumably reactive given the known chronic left foot wound/ulceration. Additional ancillary findings as above. Signed, Dulcy Fanny. Dellia Nims, RPVI [facility] Electronically Signed   By: Corrie Mckusick D.O.   On: 06/27/2020 16:38   MR FOOT LEFT WO CONTRAST  Result Date: 07/01/2020 CLINICAL DATA:  Left foot pain for 3 months EXAM: MRI OF THE LEFT FOOT WITHOUT CONTRAST TECHNIQUE: Multiplanar, multisequence MR imaging of the left foot was performed. No intravenous contrast was administered. COMPARISON:  X-ray 06/25/2020 FINDINGS: Technical note: Examination is significantly degraded by motion artifact. Bones/Joint/Cartilage Bone marrow edema with confluent low T1 marrow signal within the plantar aspect of the cuboid (series 7 and 8, images 15-16). Poor definition of the overlying cortical signal. Patient is status post forefoot amputation. The  remaining osseous structures of the midfoot and hindfoot are intact. No additional sites of bone marrow edema or osteomyelitis. No acute fracture or malalignment. No joint effusion. Ligaments Poorly evaluated.  No acute ligamentous injury is evident. Muscles and Tendons Post amputation changes to the flexor and extensor tendons. No tenosynovitis. Soft tissues Soft tissue ulceration at the plantar aspect of the left midfoot overlying the cuboid. There is associated soft tissue edema. No fluid collection or abscess. IMPRESSION: 1. Motion degraded exam. 2. Soft tissue ulceration at the left midfoot with acute osteomyelitis of the plantar aspect of the cuboid. 3. Prior forefoot amputation. No additional sites of acute osteomyelitis are identified. 4. No fluid collection or abscess. Electronically Signed   By: Davina Poke D.O.   On: 07/01/2020 16:09   US ARTERIAL ABI (SCREENING LOWER EXTREMITY)  Result Date: 06/25/2020 CLINICAL DATA:  85 year old female with a history of foot infection EXAM: NONINVASIVE PHYSIOLOGIC VASCULAR STUDY OF BILATERAL LOWER EXTREMITIES TECHNIQUE: Evaluation of both lower extremities was performed at rest, including calculation of ankle-brachial indices, multiple segmental pressure evaluation, segmental Doppler and segmental pulse volume recording. COMPARISON:  None. FINDINGS: Right ABI:  0.74 Left ABI:  0.60 Right Lower Extremity: Monophasic waveforms of the posterior tibial artery and dorsalis pedis. Left Lower Extremity: Monophasic waveforms of the posterior tibial artery and dorsalis pedis. IMPRESSION: Right: Resting ABI in the mild range of occlusive disease, although likely falsely elevated secondary to noncompressive vessels, given the monophasic waveforms at the ankles. Left: Resting ABI in the moderate range occlusive disease, likely falsely elevated secondary to noncompressible vessels given the monophasic waveforms at the ankles. Signed, Dulcy Fanny. Dellia Nims, RPVI [facility]  [facility] Radiology Special[facility] Electronically Signed   By: Corrie Mckusick D.O.   On: 06/25/2020 15:45   IR FEM POP ART ATHERECT INC PTA MOD SED  Result Date: 07/01/2020 INDICATION: 85 year old female with left lower extremity wound dehiscence and PA D presents for angiogram and possible intervention EXAM: ULTRASOUND-GUIDED ACCESS RIGHT COMMON FEMORAL ARTERY PELVIC AND LEFT LOWER EXTREMITY ANGIOGRAM LASER ATHERECTOMY LEFT SFA AND LEFT PERONEAL ARTERY DRUG-ELUTING BALLOON ANGIOPLASTY LEFT SFA BALLOON ANGIOPLASTY LEFT PERONEAL ARTERY MEDICATIONS: 8000 units IV heparin. ANESTHESIA/SEDATION: Moderate (conscious) sedation was employed during this procedure. A total of Versed 3.0 mg and Fentanyl 150 mcg was administered intravenously. Moderate Sedation Time: 310 minutes. The patient's level of consciousness and vital signs were monitored continuously by radiology nursing throughout the procedure under my direct supervision. CONTRAST:  141 CC CONTRAST FLUOROSCOPY TIME:  Fluoroscopy Time: 28 minutes 0 seconds (104.8 mGy). COMPLICATIONS: None PROCEDURE: Informed consent was obtained from the patient following explanation of the procedure, risks, benefits and alternatives. The patient understands, agrees and consents for the procedure. All questions were addressed. A time out was performed prior to the initiation of the procedure. Maximal barrier sterile technique utilized including caps, mask, sterile gowns, sterile gloves, large sterile drape, hand hygiene, and Betadine prep. Ultrasound survey of the right inguinal region was performed  with images stored and sent to PACs, confirming patency of the vessel. 1% lidocaine was used for local anesthesia. Small stab incision was made. Blunt dissection was performed. A micropuncture needle was used access the right common femoral artery under ultrasound. With excellent arterial blood flow returned, and an .018 micro wire was passed through the needle,  observed enter the abdominal aorta under fluoroscopy. The needle was removed, and a micropuncture sheath was placed over the wire. The inner dilator and wire were removed, and an 035 Bentson wire was advanced under fluoroscopy into the abdominal aorta. The sheath was removed and a standard 5 [place] vascular sheath was placed. The dilator was removed and the sheath was flushed. Omni Flush catheter was advanced to the lower abdominal aorta. Angiogram was performed. Bentson wire was navigated into the left iliac system with the use of the Omni Flush catheter. Omni Flush catheter was removed and a 035 quick cross crossing catheter was advanced to the left common femoral artery. Angiogram of the left lower extremity was performed. Rose in wire was then placed through the crossing catheter and sheath exchange was made for a 6 French 45 cm straight tip to room 0 destination sheath. The sheath was position in the proximal superficial femoral artery, and the introducer and wire were removed. Given the disease segment of the peroneal artery contributing to the dorsalis pedis, we elected to use ultrasound-guided puncture distally for a retrograde approach. 1% lidocaine was used for local anesthesia. Ultrasound-guided access of the left dorsalis pedis was then performed with a V18 wire advanced through the needle retrograde. The wire was advanced to the proximal peroneal artery with the assistance of 018 crossing catheter. The wire and catheter were in a subintimal channel within the proximal peroneal artery. Catheter was withdrawn. A whisper wire was then used in attempt to find a luminal channel within the mid segment of the peroneal artery after withdrawal of the crossing catheter. We then used a Glidewire and an angled quick cross crossing catheter through the 6 [place] sheath. The catheter was navigated to the origin of the peroneal artery. 12 g crossing wire was then passed through the angle crossing catheter antegrade  through the peroneal artery with a distal position achieved. 2 mm balloon angioplasty was then performed in the proximal segment in order to create a connecting channel from the subintimal channel and the lumen of the proximal peroneal artery. We were then successful in navigating the retrograde directed 018 system into the true lumen of the peroneal artery and the tibioperoneal trunk. The quick cross catheter was then advanced retrograde to an angled vertebral catheter. A rendezvous was then performed with retrograde passage of a whisper wire. This wire was then ultimately externalized from both the pedal access and the right femoral sheath access. 014 crossing catheter was then passed distally over the wire. With a distal position achieved in the dorsalis pedis the wire was removed. Antegrade whisper wire was placed from the top down. Once the whisper wire was in position we initiated laser atherectomy of first the femoral segment, and then the tibial segment. 1.5 mm device was used for both the femoral and the tibial segment. After atherectomy was completed, balloon angioplasty was performed of the tibial segment, with 2.5 mm balloon angioplasty starting distally. We increased the diameter to a 3 mm segment proximally. Given some slow flow through the distal segment, repeat 2 mm balloon angioplasty was performed in the distal peroneal artery crossing the ankle joint into the  dorsalis pedis. Drug-eluting balloon angioplasty was then performed in the femoropopliteal segment, with 5 mm x 120 mm. We observed a 3 minutes interval. After treatment of both segments, there was excellent response of the femoral segment with 0 residual stenosis. There was restoration of flow through the previously occluded tibial/peroneal segment into the dorsalis pedis. A non flow limiting dissection was present. We confirmed a palpable pulse at the foot in the dorsalis pedis segment and elected to withdraw at this point. The destination  sheath was exchanged for a short 6 French sheath at the right common femoral artery access point. We attempted to deploy a Celt hemostasis device, which ultimately failed and was withdrawn. Manual pressure of 25 minutes was used for hemostasis with a pressure dressing applied. Patient tolerated the procedure well and remained hemodynamically stable throughout. Blood loss was estimated 50 cc. FINDINGS: Ultrasound demonstrates significant calcifications of the right common femoral artery though patent. Pelvic angiogram demonstrates mild atherosclerosis of the lower abdominal aorta. Bilateral iliac arteries with mild atherosclerosis and no high-grade stenosis or occlusion. Hypogastric arteries patent bilaterally. Bilateral external iliac arteries patent with mild to moderate calcified plaque. Bilateral common femoral arteries patent. Proximal right SFA and profunda femoris patent. Angiogram left lower extremity demonstrates patent profunda femoris and thigh branches. Moderate to advanced atherosclerotic changes of the left SFA with high-grade 80% stenosis in the adductor canal. Popliteal artery patent without significant stenosis. Posterior tibial artery is patent with mild atherosclerosis. There is a paucity of flow from the posterior tibial artery into the common plantar artery. Variant anatomy of the tibioperoneal trunk with the TP trunk contributing to anterior tibial artery and peroneal artery. Peroneal artery is significantly disease with distal contribution to the dorsalis pedis. Anterior tibial artery is occluded in the mid segment with collateral flow. After treatment of the SFA there is 0% residual stenosis in the abductor canal. After treatment of the tibial segment, target peroneal artery, there is 100% improved perfusion into the dorsalis pedis. Non flow limiting dissection present in the TP trunk and peroneal artery, with a palpable pulse at the ankle. IMPRESSION: Status post ultrasound guided access  right common femoral artery and left dorsalis pedis for treatment of multi segment left-sided PAD contributing to poor wound healing, with laser atherectomy of both the femoral segment and occluded left peroneal artery, drug-eluting balloon angioplasty of SFA, and PTA of left peroneal artery restoring palpable dorsalis pedis pulse. Variant anatomy observed, with the TP trunk contributing to peroneal artery and anterior tibial artery, and the peroneal artery terminating in the dorsalis pedis. Signed, Dulcy Fanny. Dellia Nims, RPVI [facility] Radiology Special[facility] Electronically Signed   By: Corrie Mckusick D.O.   On: 07/01/2020 09:10   IR TIB-PERO ART ATHEREC INC PTA MOD SED  Result Date: 07/02/2020 INDICATION: 85 year old female with left lower extremity wound dehiscence and PA D presents for angiogram and possible intervention EXAM: ULTRASOUND-GUIDED ACCESS RIGHT COMMON FEMORAL ARTERY PELVIC AND LEFT LOWER EXTREMITY ANGIOGRAM LASER ATHERECTOMY LEFT SFA AND LEFT PERONEAL ARTERY DRUG-ELUTING BALLOON ANGIOPLASTY LEFT SFA BALLOON ANGIOPLASTY LEFT PERONEAL ARTERY MEDICATIONS: 8000 units IV heparin. ANESTHESIA/SEDATION: Moderate (conscious) sedation was employed during this procedure. A total of Versed 3.0 mg and Fentanyl 150 mcg was administered intravenously. Moderate Sedation Time: 310 minutes. The patient's level of consciousness and vital signs were monitored continuously by radiology nursing throughout the procedure under my direct supervision. CONTRAST:  141 CC CONTRAST FLUOROSCOPY TIME:  Fluoroscopy Time: 28 minutes 0 seconds (104.8 mGy). COMPLICATIONS:  None PROCEDURE: Informed consent was obtained from the patient following explanation of the procedure, risks, benefits and alternatives. The patient understands, agrees and consents for the procedure. All questions were addressed. A time out was performed prior to the initiation of the procedure. Maximal barrier sterile technique  utilized including caps, mask, sterile gowns, sterile gloves, large sterile drape, hand hygiene, and Betadine prep. Ultrasound survey of the right inguinal region was performed with images stored and sent to PACs, confirming patency of the vessel. 1% lidocaine was used for local anesthesia. Small stab incision was made. Blunt dissection was performed. A micropuncture needle was used access the right common femoral artery under ultrasound. With excellent arterial blood flow returned, and an .018 micro wire was passed through the needle, observed enter the abdominal aorta under fluoroscopy. The needle was removed, and a micropuncture sheath was placed over the wire. The inner dilator and wire were removed, and an 035 Bentson wire was advanced under fluoroscopy into the abdominal aorta. The sheath was removed and a standard 5 [place] vascular sheath was placed. The dilator was removed and the sheath was flushed. Omni Flush catheter was advanced to the lower abdominal aorta. Angiogram was performed. Bentson wire was navigated into the left iliac system with the use of the Omni Flush catheter. Omni Flush catheter was removed and a 035 quick cross crossing catheter was advanced to the left common femoral artery. Angiogram of the left lower extremity was performed. Rose in wire was then placed through the crossing catheter and sheath exchange was made for a 6 French 45 cm straight tip to room 0 destination sheath. The sheath was position in the proximal superficial femoral artery, and the introducer and wire were removed. Given the disease segment of the peroneal artery contributing to the dorsalis pedis, we elected to use ultrasound-guided puncture distally for a retrograde approach. 1% lidocaine was used for local anesthesia. Ultrasound-guided access of the left dorsalis pedis was then performed with a V18 wire advanced through the needle retrograde. The wire was advanced to the proximal peroneal artery with the  assistance of 018 crossing catheter. The wire and catheter were in a subintimal channel within the proximal peroneal artery. Catheter was withdrawn. A whisper wire was then used in attempt to find a luminal channel within the mid segment of the peroneal artery after withdrawal of the crossing catheter. We then used a Glidewire and an angled quick cross crossing catheter through the 6 [place] sheath. The catheter was navigated to the origin of the peroneal artery. 12 g crossing wire was then passed through the angle crossing catheter antegrade through the peroneal artery with a distal position achieved. 2 mm balloon angioplasty was then performed in the proximal segment in order to create a connecting channel from the subintimal channel and the lumen of the proximal peroneal artery. We were then successful in navigating the retrograde directed 018 system into the true lumen of the peroneal artery and the tibioperoneal trunk. The quick cross catheter was then advanced retrograde to an angled vertebral catheter. A rendezvous was then performed with retrograde passage of a whisper wire. This wire was then ultimately externalized from both the pedal access and the right femoral sheath access. 014 crossing catheter was then passed distally over the wire. With a distal position achieved in the dorsalis pedis the wire was removed. Antegrade whisper wire was placed from the top down. Once the whisper wire was in position we initiated laser atherectomy of first the femoral segment, and  then the tibial segment. 1.5 mm device was used for both the femoral and the tibial segment. After atherectomy was completed, balloon angioplasty was performed of the tibial segment, with 2.5 mm balloon angioplasty starting distally. We increased the diameter to a 3 mm segment proximally. Given some slow flow through the distal segment, repeat 2 mm balloon angioplasty was performed in the distal peroneal artery crossing the ankle joint into the  dorsalis pedis. Drug-eluting balloon angioplasty was then performed in the femoropopliteal segment, with 5 mm x 120 mm. We observed a 3 minutes interval. After treatment of both segments, there was excellent response of the femoral segment with 0 residual stenosis. There was restoration of flow through the previously occluded tibial/peroneal segment into the dorsalis pedis. A non flow limiting dissection was present. We confirmed a palpable pulse at the foot in the dorsalis pedis segment and elected to withdraw at this point. The destination sheath was exchanged for a short 6 French sheath at the right common femoral artery access point. We attempted to deploy a Celt hemostasis device, which ultimately failed and was withdrawn. Manual pressure of 25 minutes was used for hemostasis with a pressure dressing applied. Patient tolerated the procedure well and remained hemodynamically stable throughout. Blood loss was estimated 50 cc. FINDINGS: Ultrasound demonstrates significant calcifications of the right common femoral artery though patent. Pelvic angiogram demonstrates mild atherosclerosis of the lower abdominal aorta. Bilateral iliac arteries with mild atherosclerosis and no high-grade stenosis or occlusion. Hypogastric arteries patent bilaterally. Bilateral external iliac arteries patent with mild to moderate calcified plaque. Bilateral common femoral arteries patent. Proximal right SFA and profunda femoris patent. Angiogram left lower extremity demonstrates patent profunda femoris and thigh branches. Moderate to advanced atherosclerotic changes of the left SFA with high-grade 80% stenosis in the adductor canal. Popliteal artery patent without significant stenosis. Posterior tibial artery is patent with mild atherosclerosis. There is a paucity of flow from the posterior tibial artery into the common plantar artery. Variant anatomy of the tibioperoneal trunk with the TP trunk contributing to anterior tibial artery  and peroneal artery. Peroneal artery is significantly disease with distal contribution to the dorsalis pedis. Anterior tibial artery is occluded in the mid segment with collateral flow. After treatment of the SFA there is 0% residual stenosis in the abductor canal. After treatment of the tibial segment, target peroneal artery, there is 100% improved perfusion into the dorsalis pedis. Non flow limiting dissection present in the TP trunk and peroneal artery, with a palpable pulse at the ankle. IMPRESSION: Status post ultrasound guided access right common femoral artery and left dorsalis pedis for treatment of multi segment left-sided PAD contributing to poor wound healing, with laser atherectomy of both the femoral segment and occluded left peroneal artery, drug-eluting balloon angioplasty of SFA, and PTA of left peroneal artery restoring palpable dorsalis pedis pulse. Variant anatomy observed, with the TP trunk contributing to peroneal artery and anterior tibial artery, and the peroneal artery terminating in the dorsalis pedis. Signed, Dulcy Fanny. Dellia Nims, RPVI [facility] Inter[facility] Radiology Specialists [facility] Radiology Electronically Signed   By: Corrie Mckusick D.O.   On: 07/01/2020 09:10   IR US Guide Vasc Access Left  Result Date: 07/01/2020 INDICATION: 85 year old female with left lower extremity wound dehiscence and PA D presents for angiogram and possible intervention EXAM: ULTRASOUND-GUIDED ACCESS RIGHT COMMON FEMORAL ARTERY PELVIC AND LEFT LOWER EXTREMITY ANGIOGRAM LASER ATHERECTOMY LEFT SFA AND LEFT PERONEAL ARTERY DRUG-ELUTING BALLOON ANGIOPLASTY LEFT SFA BALLOON ANGIOPLASTY LEFT PERONEAL ARTERY  MEDICATIONS: 8000 units IV heparin. ANESTHESIA/SEDATION: Moderate (conscious) sedation was employed during this procedure. A total of Versed 3.0 mg and Fentanyl 150 mcg was administered intravenously. Moderate Sedation Time: 310 minutes. The patient's level of consciousness and vital signs were  monitored continuously by radiology nursing throughout the procedure under my direct supervision. CONTRAST:  141 CC CONTRAST FLUOROSCOPY TIME:  Fluoroscopy Time: 28 minutes 0 seconds (104.8 mGy). COMPLICATIONS: None PROCEDURE: Informed consent was obtained from the patient following explanation of the procedure, risks, benefits and alternatives. The patient understands, agrees and consents for the procedure. All questions were addressed. A time out was performed prior to the initiation of the procedure. Maximal barrier sterile technique utilized including caps, mask, sterile gowns, sterile gloves, large sterile drape, hand hygiene, and Betadine prep. Ultrasound survey of the right inguinal region was performed with images stored and sent to PACs, confirming patency of the vessel. 1% lidocaine was used for local anesthesia. Small stab incision was made. Blunt dissection was performed. A micropuncture needle was used access the right common femoral artery under ultrasound. With excellent arterial blood flow returned, and an .018 micro wire was passed through the needle, observed enter the abdominal aorta under fluoroscopy. The needle was removed, and a micropuncture sheath was placed over the wire. The inner dilator and wire were removed, and an 035 Bentson wire was advanced under fluoroscopy into the abdominal aorta. The sheath was removed and a standard 5 [place] vascular sheath was placed. The dilator was removed and the sheath was flushed. Omni Flush catheter was advanced to the lower abdominal aorta. Angiogram was performed. Bentson wire was navigated into the left iliac system with the use of the Omni Flush catheter. Omni Flush catheter was removed and a 035 quick cross crossing catheter was advanced to the left common femoral artery. Angiogram of the left lower extremity was performed. Rose in wire was then placed through the crossing catheter and sheath exchange was made for a 6 French 45 cm straight tip to  room 0 destination sheath. The sheath was position in the proximal superficial femoral artery, and the introducer and wire were removed. Given the disease segment of the peroneal artery contributing to the dorsalis pedis, we elected to use ultrasound-guided puncture distally for a retrograde approach. 1% lidocaine was used for local anesthesia. Ultrasound-guided access of the left dorsalis pedis was then performed with a V18 wire advanced through the needle retrograde. The wire was advanced to the proximal peroneal artery with the assistance of 018 crossing catheter. The wire and catheter were in a subintimal channel within the proximal peroneal artery. Catheter was withdrawn. A whisper wire was then used in attempt to find a luminal channel within the mid segment of the peroneal artery after withdrawal of the crossing catheter. We then used a Glidewire and an angled quick cross crossing catheter through the 6 [place] sheath. The catheter was navigated to the origin of the peroneal artery. 12 g crossing wire was then passed through the angle crossing catheter antegrade through the peroneal artery with a distal position achieved. 2 mm balloon angioplasty was then performed in the proximal segment in order to create a connecting channel from the subintimal channel and the lumen of the proximal peroneal artery. We were then successful in navigating the retrograde directed 018 system into the true lumen of the peroneal artery and the tibioperoneal trunk. The quick cross catheter was then advanced retrograde to an angled vertebral catheter. A rendezvous was then performed with retrograde passage  of a whisper wire. This wire was then ultimately externalized from both the pedal access and the right femoral sheath access. 014 crossing catheter was then passed distally over the wire. With a distal position achieved in the dorsalis pedis the wire was removed. Antegrade whisper wire was placed from the top down. Once the  whisper wire was in position we initiated laser atherectomy of first the femoral segment, and then the tibial segment. 1.5 mm device was used for both the femoral and the tibial segment. After atherectomy was completed, balloon angioplasty was performed of the tibial segment, with 2.5 mm balloon angioplasty starting distally. We increased the diameter to a 3 mm segment proximally. Given some slow flow through the distal segment, repeat 2 mm balloon angioplasty was performed in the distal peroneal artery crossing the ankle joint into the dorsalis pedis. Drug-eluting balloon angioplasty was then performed in the femoropopliteal segment, with 5 mm x 120 mm. We observed a 3 minutes interval. After treatment of both segments, there was excellent response of the femoral segment with 0 residual stenosis. There was restoration of flow through the previously occluded tibial/peroneal segment into the dorsalis pedis. A non flow limiting dissection was present. We confirmed a palpable pulse at the foot in the dorsalis pedis segment and elected to withdraw at this point. The destination sheath was exchanged for a short 6 French sheath at the right common femoral artery access point. We attempted to deploy a Celt hemostasis device, which ultimately failed and was withdrawn. Manual pressure of 25 minutes was used for hemostasis with a pressure dressing applied. Patient tolerated the procedure well and remained hemodynamically stable throughout. Blood loss was estimated 50 cc. FINDINGS: Ultrasound demonstrates significant calcifications of the right common femoral artery though patent. Pelvic angiogram demonstrates mild atherosclerosis of the lower abdominal aorta. Bilateral iliac arteries with mild atherosclerosis and no high-grade stenosis or occlusion. Hypogastric arteries patent bilaterally. Bilateral external iliac arteries patent with mild to moderate calcified plaque. Bilateral common femoral arteries patent. Proximal right  SFA and profunda femoris patent. Angiogram left lower extremity demonstrates patent profunda femoris and thigh branches. Moderate to advanced atherosclerotic changes of the left SFA with high-grade 80% stenosis in the adductor canal. Popliteal artery patent without significant stenosis. Posterior tibial artery is patent with mild atherosclerosis. There is a paucity of flow from the posterior tibial artery into the common plantar artery. Variant anatomy of the tibioperoneal trunk with the TP trunk contributing to anterior tibial artery and peroneal artery. Peroneal artery is significantly disease with distal contribution to the dorsalis pedis. Anterior tibial artery is occluded in the mid segment with collateral flow. After treatment of the SFA there is 0% residual stenosis in the abductor canal. After treatment of the tibial segment, target peroneal artery, there is 100% improved perfusion into the dorsalis pedis. Non flow limiting dissection present in the TP trunk and peroneal artery, with a palpable pulse at the ankle. IMPRESSION: Status post ultrasound guided access right common femoral artery and left dorsalis pedis for treatment of multi segment left-sided PAD contributing to poor wound healing, with laser atherectomy of both the femoral segment and occluded left peroneal artery, drug-eluting balloon angioplasty of SFA, and PTA of left peroneal artery restoring palpable dorsalis pedis pulse. Variant anatomy observed, with the TP trunk contributing to peroneal artery and anterior tibial artery, and the peroneal artery terminating in the dorsalis pedis. Signed, Dulcy Fanny. Dellia Nims, RPVI [facility] Inter[facility] Rad[facility]iology Electronically Signed   By:  Corrie Mckusick D.O.   On: 07/01/2020 09:10   IR US Guide Vasc Access Right  Result Date: 07/01/2020 INDICATION: 85 year old female with left lower extremity wound dehiscence and PA D presents for angiogram and possible  intervention EXAM: ULTRASOUND-GUIDED ACCESS RIGHT COMMON FEMORAL ARTERY PELVIC AND LEFT LOWER EXTREMITY ANGIOGRAM LASER ATHERECTOMY LEFT SFA AND LEFT PERONEAL ARTERY DRUG-ELUTING BALLOON ANGIOPLASTY LEFT SFA BALLOON ANGIOPLASTY LEFT PERONEAL ARTERY MEDICATIONS: 8000 units IV heparin. ANESTHESIA/SEDATION: Moderate (conscious) sedation was employed during this procedure. A total of Versed 3.0 mg and Fentanyl 150 mcg was administered intravenously. Moderate Sedation Time: 310 minutes. The patient's level of consciousness and vital signs were monitored continuously by radiology nursing throughout the procedure under my direct supervision. CONTRAST:  141 CC CONTRAST FLUOROSCOPY TIME:  Fluoroscopy Time: 28 minutes 0 seconds (104.8 mGy). COMPLICATIONS: None PROCEDURE: Informed consent was obtained from the patient following explanation of the procedure, risks, benefits and alternatives. The patient understands, agrees and consents for the procedure. All questions were addressed. A time out was performed prior to the initiation of the procedure. Maximal barrier sterile technique utilized including caps, mask, sterile gowns, sterile gloves, large sterile drape, hand hygiene, and Betadine prep. Ultrasound survey of the right inguinal region was performed with images stored and sent to PACs, confirming patency of the vessel. 1% lidocaine was used for local anesthesia. Small stab incision was made. Blunt dissection was performed. A micropuncture needle was used access the right common femoral artery under ultrasound. With excellent arterial blood flow returned, and an .018 micro wire was passed through the needle, observed enter the abdominal aorta under fluoroscopy. The needle was removed, and a micropuncture sheath was placed over the wire. The inner dilator and wire were removed, and an 035 Bentson wire was advanced under fluoroscopy into the abdominal aorta. The sheath was removed and a standard 5 [place] vascular sheath was  placed. The dilator was removed and the sheath was flushed. Omni Flush catheter was advanced to the lower abdominal aorta. Angiogram was performed. Bentson wire was navigated into the left iliac system with the use of the Omni Flush catheter. Omni Flush catheter was removed and a 035 quick cross crossing catheter was advanced to the left common femoral artery. Angiogram of the left lower extremity was performed. Rose in wire was then placed through the crossing catheter and sheath exchange was made for a 6 French 45 cm straight tip to room 0 destination sheath. The sheath was position in the proximal superficial femoral artery, and the introducer and wire were removed. Given the disease segment of the peroneal artery contributing to the dorsalis pedis, we elected to use ultrasound-guided puncture distally for a retrograde approach. 1% lidocaine was used for local anesthesia. Ultrasound-guided access of the left dorsalis pedis was then performed with a V18 wire advanced through the needle retrograde. The wire was advanced to the proximal peroneal artery with the assistance of 018 crossing catheter. The wire and catheter were in a subintimal channel within the proximal peroneal artery. Catheter was withdrawn. A whisper wire was then used in attempt to find a luminal channel within the mid segment of the peroneal artery after withdrawal of the crossing catheter. We then used a Glidewire and an angled quick cross crossing catheter through the 6 [place] sheath. The catheter was navigated to the origin of the peroneal artery. 12 g crossing wire was then passed through the angle crossing catheter antegrade through the peroneal artery with a distal position achieved. 2 mm balloon angioplasty  was then performed in the proximal segment in order to create a connecting channel from the subintimal channel and the lumen of the proximal peroneal artery. We were then successful in navigating the retrograde directed 018 system into  the true lumen of the peroneal artery and the tibioperoneal trunk. The quick cross catheter was then advanced retrograde to an angled vertebral catheter. A rendezvous was then performed with retrograde passage of a whisper wire. This wire was then ultimately externalized from both the pedal access and the right femoral sheath access. 014 crossing catheter was then passed distally over the wire. With a distal position achieved in the dorsalis pedis the wire was removed. Antegrade whisper wire was placed from the top down. Once the whisper wire was in position we initiated laser atherectomy of first the femoral segment, and then the tibial segment. 1.5 mm device was used for both the femoral and the tibial segment. After atherectomy was completed, balloon angioplasty was performed of the tibial segment, with 2.5 mm balloon angioplasty starting distally. We increased the diameter to a 3 mm segment proximally. Given some slow flow through the distal segment, repeat 2 mm balloon angioplasty was performed in the distal peroneal artery crossing the ankle joint into the dorsalis pedis. Drug-eluting balloon angioplasty was then performed in the femoropopliteal segment, with 5 mm x 120 mm. We observed a 3 minutes interval. After treatment of both segments, there was excellent response of the femoral segment with 0 residual stenosis. There was restoration of flow through the previously occluded tibial/peroneal segment into the dorsalis pedis. A non flow limiting dissection was present. We confirmed a palpable pulse at the foot in the dorsalis pedis segment and elected to withdraw at this point. The destination sheath was exchanged for a short 6 French sheath at the right common femoral artery access point. We attempted to deploy a Celt hemostasis device, which ultimately failed and was withdrawn. Manual pressure of 25 minutes was used for hemostasis with a pressure dressing applied. Patient tolerated the procedure well and  remained hemodynamically stable throughout. Blood loss was estimated 50 cc. FINDINGS: Ultrasound demonstrates significant calcifications of the right common femoral artery though patent. Pelvic angiogram demonstrates mild atherosclerosis of the lower abdominal aorta. Bilateral iliac arteries with mild atherosclerosis and no high-grade stenosis or occlusion. Hypogastric arteries patent bilaterally. Bilateral external iliac arteries patent with mild to moderate calcified plaque. Bilateral common femoral arteries patent. Proximal right SFA and profunda femoris patent. Angiogram left lower extremity demonstrates patent profunda femoris and thigh branches. Moderate to advanced atherosclerotic changes of the left SFA with high-grade 80% stenosis in the adductor canal. Popliteal artery patent without significant stenosis. Posterior tibial artery is patent with mild atherosclerosis. There is a paucity of flow from the posterior tibial artery into the common plantar artery. Variant anatomy of the tibioperoneal trunk with the TP trunk contributing to anterior tibial artery and peroneal artery. Peroneal artery is significantly disease with distal contribution to the dorsalis pedis. Anterior tibial artery is occluded in the mid segment with collateral flow. After treatment of the SFA there is 0% residual stenosis in the abductor canal. After treatment of the tibial segment, target peroneal artery, there is 100% improved perfusion into the dorsalis pedis. Non flow limiting dissection present in the TP trunk and peroneal artery, with a palpable pulse at the ankle. IMPRESSION: Status post ultrasound guided access right common femoral artery and left dorsalis pedis for treatment of multi segment left-sided PAD contributing to poor wound healing, with  laser atherectomy of both the femoral segment and occluded left peroneal artery, drug-eluting balloon angioplasty of SFA, and PTA of left peroneal artery restoring palpable dorsalis  pedis pulse. Variant anatomy observed, with the TP trunk contributing to peroneal artery and anterior tibial artery, and the peroneal artery terminating in the dorsalis pedis. Signed, Dulcy Fanny. Dellia Nims, RPVI Vascular and Interventional Radiology Specialists [facility]iology Electronically Signed   By: Corrie Mckusick D.O.   On: 07/01/2020 09:10   DG Foot Complete Left  Result Date: 06/25/2020 CLINICAL DATA:  Diabetic patient with a skin ulceration on the left foot. History of prior amputation. EXAM: LEFT FOOT - COMPLETE 3+ VIEW COMPARISON:  Plain films left ankle 11/18/2016. FINDINGS: Again seen is postoperative change of forefoot amputation. There is a large skin ulceration projecting inferior to the cuboid. There is subtle loss of cortical bone along the inferior margin of the cuboid worrisome for osteomyelitis. Multiple small calcifications are seen at the amputation site. On the frontal view, 2 ossific fragments are seen at the wound. IMPRESSION: Large skin wound on the plantar surface of the foot with findings worrisome for osteomyelitis of the cuboid. Electronically Signed   By: Inge Rise M.D.   On: 06/25/2020 11:42   [place] EKG SITE RITE  Result Date: 07/02/2020 If Site Rite image not attached, placement could not be confirmed due to current cardiac rhythm.     Subjective: Denies any significant pain.  No shortness of breath.  No cough.  Discharge Exam: Vitals:   07/01/20 2046 07/02/20 0850 07/02/20 2036 07/03/20 0400  BP: (!) 121/55 109/65 (!) 116/54 110/70  Pulse: 70 67 (!) 56 60  Resp:   20 14  Temp:   98.1 F (36.7 C) 98.4 F (36.9 C)  TempSrc:   Oral Oral  SpO2:  97% 98% 96%  Weight:      Height:        General: Pt is alert, awake, not in acute distress Cardiovascular: RRR, S1/S2 +, no rubs, no gallops Respiratory: CTA bilaterally, no wheezing, no rhonchi Abdominal: Soft, NT, ND, bowel sounds + Extremities: Status post left foot transmetatarsal amputation.  Foot  is wrapped in dressings.    The results of significant diagnostics from this hospitalization (including imaging, microbiology, ancillary and laboratory) are listed below for reference.     Microbiology: Recent Results (from the past 240 hour(s))  Wound or Superficial Culture     Status: None   Collection Time: 06/25/20 11:35 AM   Specimen: Foot; Wound  Result Value Ref Range Status   Specimen Description   Final    FOOT LEFT Performed at [facility], [address], [place], [place] [address]    Special Requests   Final    NONE Performed at [facility], [address], [place], [place] [address]    Gram Stain   Final    NO WBC SEEN ABUNDANT GRAM POSITIVE COCCI FEW GRAM NEGATIVE RODS    Culture   Final    FEW GROUP B STREP(S.AGALACTIAE)ISOLATED TESTING AGAINST S. AGALACTIAE NOT ROUTINELY PERFORMED DUE TO PREDICTABILITY OF AMP/PEN/VAN SUSCEPTIBILITY. NO STAPHYLOCOCCUS AUREUS ISOLATED WITHIN MIXED CULTURE Performed at [facility], [place] [address] [place], [place] [address]    Report Status 06/27/2020 FINAL  Final  Culture, blood (routine x 2)     Status: None   Collection Time: 06/25/20 12:01 PM   Specimen: BLOOD  Result Value Ref Range Status   Specimen Description BLOOD BLOOD LEFT HAND  Final  Special Requests   Final    BOTTLES DRAWN AEROBIC AND ANAEROBIC Blood Culture results may not be optimal due to an inadequate volume of blood received in culture bottles   Culture   Final    NO GROWTH 5 DAYS Performed at [facility], [address] [place], [place] [address]    Report Status 06/30/2020 FINAL  Final  Culture, blood (routine x 2)     Status: None   Collection Time: 06/25/20 12:01 PM   Specimen: BLOOD  Result Value Ref Range Status   Specimen Description BLOOD LEFT ANTECUBITAL  Final   Special Requests   Final    BOTTLES DRAWN AEROBIC AND ANAEROBIC Blood Culture adequate volume   Culture   Final    NO GROWTH 5 DAYS Performed at [facility],  [address] [place], [place] [address]    Report Status 06/30/2020 FINAL  Final  SARS CORONAVIRUS 2 (TAT 6-24 HRS) Nasopharyngeal Nasopharyngeal Swab     Status: None   Collection Time: 06/25/20  2:00 PM   Specimen: Nasopharyngeal Swab  Result Value Ref Range Status   SARS Coronavirus 2 NEGATIVE NEGATIVE Final    Comment: (NOTE) SARS-CoV-2 target nucleic acids are NOT DETECTED.  The SARS-CoV-2 RNA is generally detectable in upper and lower respiratory specimens during the acute phase of infection. Negative results do not preclude SARS-CoV-2 infection, do not rule out co-infections with other pathogens, and should not be used as the sole basis for treatment or other patient management decisions. Negative results must be combined with clinical observations, patient history, and epidemiological information. The expected result is Negative.  Fact Sheet for Patients: SugarRoll.be  Fact Sheet for Healthcare Providers: https://www.woods-mathews.com/  This test is not yet approved or cleared by the [place] FDA and  has been authorized for detection and/or diagnosis of SARS-CoV-2 by FDA under an Emergency Use Authorization (EUA). This EUA will remain  in effect (meaning this test can be used) for the duration of the COVID-19 declaration under Se ction 564(b)(1) of the Act, 21 U.S.C. section 360bbb-3(b)(1), unless the authorization is terminated or revoked sooner.  Performed at [facility], [place] [address] [place], [place] [address]   MRSA PCR Screening     Status: Abnormal   Collection Time: 06/26/20  1:32 AM   Specimen: Nasal Mucosa; Nasopharyngeal  Result Value Ref Range Status   MRSA by PCR POSITIVE (A) NEGATIVE Final    Comment:        The GeneXpert MRSA Assay (FDA approved for NASAL specimens only), is one component of a comprehensive MRSA colonization surveillance program. It is not intended to diagnose MRSA infection nor  to guide or monitor treatment for MRSA infections. RESULT CALLED TO, READ BACK BY AND VERIFIED WITH: PEACH,RN@[email or web address]  06/26/20 [facility] Performed at [facility], [address], [place], [place] [address]   SARS CORONAVIRUS 2 (TAT 6-24 HRS) Nasopharyngeal Nasopharyngeal Swab     Status: Abnormal   Collection Time: 07/01/20  8:52 AM   Specimen: Nasopharyngeal Swab  Result Value Ref Range Status   SARS Coronavirus 2 POSITIVE (A) NEGATIVE Final    Comment: (NOTE) SARS-CoV-2 target nucleic acids are DETECTED.  The SARS-CoV-2 RNA is generally detectable in upper and lower respiratory specimens during the acute phase of infection. Positive results are indicative of the presence of SARS-CoV-2 RNA. Clinical correlation with patient history and other diagnostic information is  necessary to determine patient infection status. Positive results do not rule out bacterial infection or  co-infection with other viruses.  The expected result is Negative.  Fact Sheet for Patients: SugarRoll.be  Fact Sheet for Healthcare Providers: https://www.woods-mathews.com/  This test is not yet approved or cleared by the [place] FDA and  has been authorized for detection and/or diagnosis of SARS-CoV-2 by FDA under an Emergency Use Authorization (EUA). This EUA will remain  in effect (meaning this test can be used) for the duration of the COVID-19 declaration under Section 564(b)(1) of the Act, 21 U. S.C. section 360bbb-3(b)(1), unless the authorization is terminated or revoked sooner.   Performed at [facility], [place] [address] [place], [place] [address]   Resp Panel by RT-PCR (Flu A&B, Covid) Nasopharyngeal Swab     Status: Abnormal   Collection Time: 07/01/20 12:05 PM   Specimen: Nasopharyngeal Swab; Nasopharyngeal(NP) swabs in vial transport medium  Result Value Ref Range Status   SARS Coronavirus 2 by RT PCR POSITIVE (A) NEGATIVE Final    Comment:  RESULT CALLED TO, READ BACK BY AND VERIFIED WITH: THOMAS, C. 1/10 @ 1320 BY BEARD, S. (NOTE) SARS-CoV-2 target nucleic acids are DETECTED.  The SARS-CoV-2 RNA is generally detectable in upper respiratory specimens during the acute phase of infection. Positive results are indicative of the presence of the identified virus, but do not rule out bacterial infection or co-infection with other pathogens not detected by the test. Clinical correlation with patient history and other diagnostic information is necessary to determine patient infection status. The expected result is Negative.  Fact Sheet for Patients: EntrepreneurPulse.com.au  Fact Sheet for Healthcare Providers: IncredibleEmployment.be  This test is not yet approved or cleared by the [place] FDA and  has been authorized for detection and/or diagnosis of SARS-CoV-2 by FDA under an Emergency Use Authorization (EUA).  This EUA will remain in effect (meaning this test can  be used) for the duration of  the COVID-19 declaration under Section 564(b)(1) of the Act, 21 U.S.C. section 360bbb-3(b)(1), unless the authorization is terminated or revoked sooner.     Influenza A by PCR NEGATIVE NEGATIVE Final   Influenza B by PCR NEGATIVE NEGATIVE Final    Comment: (NOTE) The Xpert Xpress SARS-CoV-2/FLU/RSV plus assay is intended as an aid in the diagnosis of influenza from Nasopharyngeal swab specimens and should not be used as a sole basis for treatment. Nasal washings and aspirates are unacceptable for Xpert Xpress SARS-CoV-2/FLU/RSV testing.  Fact Sheet for Patients: EntrepreneurPulse.com.au  Fact Sheet for Healthcare Providers: IncredibleEmployment.be  This test is not yet approved or cleared by the [place] FDA and has been authorized for detection and/or diagnosis of SARS-CoV-2 by FDA under an Emergency Use Authorization (EUA). This EUA will  remain in effect (meaning this test can be used) for the duration of the COVID-19 declaration under Section 564(b)(1) of the Act, 21 U.S.C. section 360bbb-3(b)(1), unless the authorization is terminated or revoked.  Performed at [facility], [address] [place], [place] [address]      Labs: BNP (last 3 results) No results for input(s): BNP in the last 8760 hours. Basic Metabolic Panel: Recent Labs  Lab 06/28/20 0632 06/30/20 0646 07/01/20 0635 07/03/20 0712  NA 134* 131* 132*  --   K 3.8 4.2 3.9  --   CL 96* 92* 90*  --   CO2 29 28 30   --   GLUCOSE 345* 338* 246*  --   BUN 18 23 25*  --   CREATININE 0.88 0.87 0.81 0.84  CALCIUM 8.9 8.8* 8.4*  --  Liver Function Tests: No results for input(s): AST, ALT, ALKPHOS, BILITOT, PROT, ALBUMIN in the last 168 hours. No results for input(s): LIPASE, AMYLASE in the last 168 hours. No results for input(s): AMMONIA in the last 168 hours. CBC: Recent Labs  Lab 06/28/20 0632 06/30/20 0646 07/01/20 0635  WBC 7.1 6.0 4.7  NEUTROABS  --  3.7 2.3  HGB 11.9* 11.3* 10.3*  HCT 37.7 37.3 33.2*  MCV 98.2 99.7 98.2  PLT 334 271 265   Cardiac Enzymes: No results for input(s): CKTOTAL, CKMB, CKMBINDEX, TROPONINI in the last 168 hours. BNP: Invalid input(s): POCBNP CBG: Recent Labs  Lab 07/03/20 0224 07/03/20 0243 07/03/20 0315 07/03/20 0534 07/03/20 0739  GLUCAP 39* 61* 90 149* 159*   D-Dimer No results for input(s): DDIMER in the last 72 hours. Hgb A1c No results for input(s): HGBA1C in the last 72 hours. Lipid Profile No results for input(s): CHOL, HDL, LDLCALC, TRIG, CHOLHDL, LDLDIRECT in the last 72 hours. Thyroid function studies No results for input(s): TSH, T4TOTAL, T3FREE, THYROIDAB in the last 72 hours.  Invalid input(s): FREET3 Anemia work up No results for input(s): VITAMINB12, FOLATE, FERRITIN, TIBC, IRON, RETICCTPCT in the last 72 hours. Urinalysis    Component Value Date/Time   COLORURINE YELLOW  02/09/2017 2030   [place] 02/09/2017 2030   [place] 1.009 02/09/2017 2030   PHURINE 7.0 02/09/2017 2030   GLUCOSEU NEGATIVE 02/09/2017 2030   [place] NEGATIVE 02/09/2017 2030   [place] 02/09/2017 2030   [place] 02/09/2017 2030   PROTEINUR 30 (A) 02/09/2017 2030   UROBILINOGEN 0.2 02/05/2015 0528   NITRITE NEGATIVE 02/09/2017 2030   LEUKOCYTESUR MODERATE (A) 02/09/2017 2030   Sepsis Labs Invalid input(s): PROCALCITONIN,  WBC,  LACTICIDVEN Microbiology Recent Results (from the past 240 hour(s))  Wound or Superficial Culture     Status: None   Collection Time: 06/25/20 11:35 AM   Specimen: Foot; Wound  Result Value Ref Range Status   Specimen Description   Final    FOOT LEFT Performed at [facility], [address], [place], [place] [address]    Special Requests   Final    NONE Performed at [facility], [address], [place], [place] [address]    Gram Stain   Final    NO WBC SEEN ABUNDANT GRAM POSITIVE COCCI FEW GRAM NEGATIVE RODS    Culture   Final    FEW GROUP B STREP(S.AGALACTIAE)ISOLATED TESTING AGAINST S. AGALACTIAE NOT ROUTINELY PERFORMED DUE TO PREDICTABILITY OF AMP/PEN/VAN SUSCEPTIBILITY. NO STAPHYLOCOCCUS AUREUS ISOLATED WITHIN MIXED CULTURE Performed at [facility], [place] [address] [place], [place] [address]    Report Status 06/27/2020 FINAL  Final  Culture, blood (routine x 2)     Status: None   Collection Time: 06/25/20 12:01 PM   Specimen: BLOOD  Result Value Ref Range Status   Specimen Description BLOOD BLOOD LEFT HAND  Final   Special Requests   Final    BOTTLES DRAWN AEROBIC AND ANAEROBIC Blood Culture results may not be optimal due to an inadequate volume of blood received in culture bottles   Culture   Final    NO GROWTH 5 DAYS Performed at [facility], [address] [place],  [address]    Report Status 06/30/2020 FINAL  Final  Culture, blood (routine x 2)     Status: None   Collection Time:  06/25/20 12:01 PM   Specimen: BLOOD  Result Value Ref Range Status   Specimen Description BLOOD LEFT  ANTECUBITAL  Final   Special Requests   Final    BOTTLES DRAWN AEROBIC AND ANAEROBIC Blood Culture adequate volume   Culture   Final    NO GROWTH 5 DAYS Performed at [facility], [address] [place], [place] [address]    Report Status 06/30/2020 FINAL  Final  SARS CORONAVIRUS 2 (TAT 6-24 HRS) Nasopharyngeal Nasopharyngeal Swab     Status: None   Collection Time: 06/25/20  2:00 PM   Specimen: Nasopharyngeal Swab  Result Value Ref Range Status   SARS Coronavirus 2 NEGATIVE NEGATIVE Final    Comment: (NOTE) SARS-CoV-2 target nucleic acids are NOT DETECTED.  The SARS-CoV-2 RNA is generally detectable in upper and lower respiratory specimens during the acute phase of infection. Negative results do not preclude SARS-CoV-2 infection, do not rule out co-infections with other pathogens, and should not be used as the sole basis for treatment or other patient management decisions. Negative results must be combined with clinical observations, patient history, and epidemiological information. The expected result is Negative.  Fact Sheet for Patients: SugarRoll.be  Fact Sheet for Healthcare Providers: https://www.woods-mathews.com/  This test is not yet approved or cleared by the [place] FDA and  has been authorized for detection and/or diagnosis of SARS-CoV-2 by FDA under an Emergency Use Authorization (EUA). This EUA will remain  in effect (meaning this test can be used) for the duration of the COVID-19 declaration under Se ction 564(b)(1) of the Act, 21 U.S.C. section 360bbb-3(b)(1), unless the authorization is terminated or revoked sooner.  Performed at [facility], [place] [address], [place], [place] [address]   MRSA PCR Screening     Status: Abnormal   Collection Time: 06/26/20  1:32 AM   Specimen: Nasal Mucosa;  Nasopharyngeal  Result Value Ref Range Status   MRSA by PCR POSITIVE (A) NEGATIVE Final    Comment:        The GeneXpert MRSA Assay (FDA approved for NASAL specimens only), is one component of a comprehensive MRSA colonization surveillance program. It is not intended to diagnose MRSA infection nor to guide or monitor treatment for MRSA infections. RESULT CALLED TO, READ BACK BY AND VERIFIED WITH: PEACH,RN@0520  06/26/20 [facility] Performed at [facility], [address] [place], [place] [address]   SARS CORONAVIRUS 2 (TAT 6-24 HRS) Nasopharyngeal Nasopharyngeal Swab     Status: Abnormal   Collection Time: 07/01/20  8:52 AM   Specimen: Nasopharyngeal Swab  Result Value Ref Range Status   SARS Coronavirus 2 POSITIVE (A) NEGATIVE Final    Comment: (NOTE) SARS-CoV-2 target nucleic acids are DETECTED.  The SARS-CoV-2 RNA is generally detectable in upper and lower respiratory specimens during the acute phase of infection. Positive results are indicative of the presence of SARS-CoV-2 RNA. Clinical correlation with patient history and other diagnostic information is  necessary to determine patient infection status. Positive results do not rule out bacterial infection or co-infection with other viruses.  The expected result is Negative.  Fact Sheet for Patients: SugarRoll.be  Fact Sheet for Healthcare Providers: https://www.woods-mathews.com/  This test is not yet approved or cleared by the [place] FDA and  has been authorized for detection and/or diagnosis of SARS-CoV-2 by FDA under an Emergency Use Authorization (EUA). This EUA will remain  in effect (meaning this test can be used) for the duration of the COVID-19 declaration under Section 564(b)(1) of the Act, 21 U. S.C. section 360bbb-3(b)(1), unless the authorization is terminated or revoked sooner.   Performed at [facility], [place] [address] [place], [place] [address]    Resp Panel by RT-PCR (Flu A&B, Covid) Nasopharyngeal Swab     Status: Abnormal   Collection Time: 07/01/20 12:05 PM   Specimen: Nasopharyngeal Swab; Nasopharyngeal(NP) swabs in vial transport medium  Result Value Ref Range Status   SARS Coronavirus 2 by RT PCR POSITIVE (A) NEGATIVE Final    Comment: RESULT CALLED TO, READ BACK BY AND VERIFIED WITH: THOMAS, C. 1/10 @ 1320 BY BEARD, S. (NOTE) SARS-CoV-2 target nucleic acids are DETECTED.  The SARS-CoV-2 RNA is generally detectable in upper respiratory specimens during the acute phase of infection. Positive results are indicative of the presence of the identified virus, but do not rule out bacterial infection or co-infection with other pathogens not detected by the test. Clinical correlation with patient history and other diagnostic information is necessary to determine patient infection status. The expected result is Negative.  Fact Sheet for Patients: EntrepreneurPulse.com.au  Fact Sheet for Healthcare Providers: IncredibleEmployment.be  This test is not yet approved or cleared by the [place] FDA and  has been authorized for detection and/or diagnosis of SARS-CoV-2 by FDA under an Emergency Use Authorization (EUA).  This EUA will remain in effect (meaning this test can  be used) for the duration of  the COVID-19 declaration under Section 564(b)(1) of the Act, 21 U.S.C. section 360bbb-3(b)(1), unless the authorization is terminated or revoked sooner.     Influenza A by PCR NEGATIVE NEGATIVE Final   Influenza B by PCR NEGATIVE NEGATIVE Final    Comment: (NOTE) The Xpert Xpress SARS-CoV-2/FLU/RSV plus assay is intended as an aid in the diagnosis of influenza from Nasopharyngeal swab specimens and should not be used as a sole basis for treatment. Nasal washings and aspirates are unacceptable for Xpert Xpress SARS-CoV-2/FLU/RSV testing.  Fact Sheet for  Patients: EntrepreneurPulse.com.au  Fact Sheet for Healthcare Providers: IncredibleEmployment.be  This test is not yet approved or cleared by the [place] FDA and has been authorized for detection and/or diagnosis of SARS-CoV-2 by FDA under an Emergency Use Authorization (EUA). This EUA will remain in effect (meaning this test can be used) for the duration of the COVID-19 declaration under Section 564(b)(1) of the Act, 21 U.S.C. section 360bbb-3(b)(1), unless the authorization is terminated or revoked.  Performed at [facility], [address], [place], [place] [address]      Time coordinating discharge: 55mns  SIGNED:   JKathie Dike MD  [facility] 07/03/2020, 9:24 AM   If 7PM-7AM, please contact night-coverage [email or web address]

## 2020-07-03 NOTE — Progress Notes (Addendum)
Hypoglycemic Event Patient received standing order for NPH insulin of 30 units post meal- BG was 137  At 2100. Patient stated at 0213 that she felt like her BG was low.   CBG: 44  Treatment: Gave Patient 8 oz of apple juice because she is refusing administration of D50.  Symptoms:  No physical symptoms other than patient stating she felt like she was dropping.   Follow-up CBG: Time 0245 CBG Result: 30  Gave patient additional 8 oz of orange juice at 0248  Follow-up CBG: time: 0216 Result: 90  Possible Reasons for Event: NPH insulin order of 30 units given post dinner at scheduled time. BG was 137 at time of NPH dose   Comments/MD notified: Dr. Sheliah Plane, A. Notified at 0225 of critical low BG    Jonne Ply

## 2020-07-03 NOTE — Progress Notes (Signed)
Peripherally Inserted Central Catheter Placement  The IV Nurse has discussed with the patient and/or persons authorized to consent for the patient, the purpose of this procedure and the potential benefits and risks involved with this procedure.  The benefits include less needle sticks, lab draws from the catheter, and the patient may be discharged home with the catheter. Risks include, but not limited to, infection, bleeding, blood clot (thrombus formation), and puncture of an artery; nerve damage and irregular heartbeat and possibility to perform a PICC exchange if needed/ordered by physician.  Alternatives to this procedure were also discussed.  Bard Power PICC patient education guide, fact sheet on infection prevention and patient information card has been provided to patient /or left at bedside.    PICC Placement Documentation  PICC Single Lumen 07/03/20 PICC Right Basilic 39 cm 0 cm (Active)  Indication for Insertion or Continuance of Line Prolonged intravenous therapies 07/03/20 1358  Exposed Catheter (cm) 0 cm 07/03/20 1358  Site Assessment Clean;Dry;Intact 07/03/20 1358  Line Status Flushed;Blood return noted;Saline locked 07/03/20 1358  Dressing Type Transparent 07/03/20 1358  Dressing Status Clean;Dry;Intact 07/03/20 1358  Antimicrobial disc in place? Yes 07/03/20 1358  Dressing Change Due 07/10/20 07/03/20 1358       Audrie Gallus 07/03/2020, 1:59 PM

## 2020-07-03 NOTE — Progress Notes (Signed)
PROGRESS NOTE  Tara Flores JWJ:[id number] DOB: 1936/04/19 DOA: 06/25/2020 PCP: Bernerd Limbo, MD  Brief History:  85 y.o.femalewith PMH significant for DM2, chronic diastolic CHF, atrial fibrillation, peripheral vascular disease, ongoing tobacco abuse and status post partial left foot transmetatarsal amputation on 4/18 who is a long-term care resident at [facility].  Patient was sent to the ED on 1/4 with grossly worsening redness and swelling in her left foot going up her leg.  In the ED, x-ray of left foot noted osteomyelitis of the left cuboid.  Admitted under hospitalist service.  General surgery was consulted for possible need of amputation. However patient is not yet clear if she wants to proceed with amputation. Palliative care consultation was obtained.  1/6, patient underwent bedside debridement by general surgery of left foot stump. 1/6, CT angiogram of aorta and bifemoral showed multilevel peripheral artery disease including multifocal high-grade SFA stenosis bilaterally. 1/7, patient had angiogram done by IR she underwent treatment of left SFA stenosis and left peroneal artery occlusion. She had drug eluting ballooning of the left SFA with 0% residual stenting and PTA of the left peroneal artery restoring 100% improved flow.   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 Pt adamantly against amputation.  -Currently on IV Rocephin. MRI confirms osteomyelitis Patient does not want to have surgical management at this time -Discussed with infectious disease recommended 6 weeks of vancomycin and ceftriaxone--last day vanco on 08/12/20; ceftriaxone on 08/05/20 -PICC line placed for antibiotic therapy--07/03/20 -Follow-up with general surgery in the next few weeks if patient reconsiders surgical management -Palliative care to follow patient at skilled  nursing facility to further address goals of care  Peripheral vascular disease Ongoing tobacco use -1/7, patient had angiogram done by IR she underwent treatment of left SFA stenosis and left peroneal artery occlusion. She had drug eluting ballooning of the left SFA with 0% residual stenting and PTA of the left peroneal artery restoring 100% improved flow. -Counseled to quit smoking. -She is to continue on aspirin and eliquis (d/c plavix) -Follow-up with interventional radiology, Dr. Loreta Ave in 6 weeks  Covid Infection -patient does not appear to be symptomatic at this time.  Would continue supportive treatment.  Pt remained stable on RA throughout the hospitalization without any sob, n/v/d  Chronic diastolic CHF Essential hypertension -Currently blood pressure is stable, CHF stable -appears clinically euvolemic -Home meds include Bisoprolol 5 mg daily, Cardizem 120 mg daily, Lasix 60 mg twice daily, Imdur 15 mg daily, Aldactone 12.5 mg twice daily.  Atrial fibrillation -Continue bisoprolol, Cardizem. Continue anticoagulation with Eliquis.  Type 2 diabetes mellitus -A1c 9.6 on 1/4 -Home meds include Exenatide 10 mcg twice daily, NPH 40 units and 20 units, sliding scale insulin, Jardiance 10 mg daily.  These should be resumed on discharge -NPH was decreased during hospitalization due to hypoglycemia   Hypothyroidism -Continue Synthroid.  COPD  -Stable.   Anxiety  -Continue Xanax 0.25 mg twice daily,      Status is: Inpatient  Remains inpatient appropriate because:IV treatments appropriate due to intensity of illness or inability to take PO   Dispo: The patient is from: SNF              Anticipated d/c is to: SNF              Anticipated d/c date is: 07/03/20  Patient currently is medically stable to d/c.        Family Communication:  no Family at bedside  Consultants:  General surgery  Code Status:  DNR  DVT Prophylaxis:   apixbabn   Procedures: As Listed in Progress Note Above  Antibiotics: vanco ceftriaxone     Subjective: Patient denies fevers, chills, headache, chest pain, dyspnea, nausea, vomiting, diarrhea, abdominal pain   Objective: Vitals:   07/01/20 2046 07/02/20 0850 07/02/20 2036 07/03/20 0400  BP: (!) 121/55 109/65 (!) 116/54 110/70  Pulse: 70 67 (!) 56 60  Resp:   20 14  Temp:   98.1 F (36.7 C) 98.4 F (36.9 C)  TempSrc:   Oral Oral  SpO2:  97% 98% 96%  Weight:      Height:        Intake/Output Summary (Last 24 hours) at 07/03/2020 1256 Last data filed at 07/03/2020 0900 Gross per 24 hour  Intake 548.69 ml  Output --  Net 548.69 ml   Weight change:  Exam:   General:  Pt is alert, follows commands appropriately, not in acute distress  HEENT: No icterus, No thrush, No neck mass, [place]/AT  Cardiovascular: RRR, S1/S2, no rubs, no gallops  Respiratory: bibasilar rales. No wheeze  Abdomen: Soft/+BS, non tender, non distended, no guarding  Extremities: No edema, No lymphangitis, No petechiae, No rashes, no synovitis   Data Reviewed: I have personally reviewed following labs and imaging studies Basic Metabolic Panel: Recent Labs  Lab 06/28/20 0632 06/30/20 0646 07/01/20 0635 07/03/20 0712  NA 134* 131* 132*  --   K 3.8 4.2 3.9  --   CL 96* 92* 90*  --   CO2 29 28 30   --   GLUCOSE 345* 338* 246*  --   BUN 18 23 25*  --   CREATININE 0.88 0.87 0.81 0.84  CALCIUM 8.9 8.8* 8.4*  --    Liver Function Tests: No results for input(s): AST, ALT, ALKPHOS, BILITOT, PROT, ALBUMIN in the last 168 hours. No results for input(s): LIPASE, AMYLASE in the last 168 hours. No results for input(s): AMMONIA in the last 168 hours. Coagulation Profile: Recent Labs  Lab 06/28/20 0632  INR 0.9   CBC: Recent Labs  Lab 06/28/20 0632 06/30/20 0646 07/01/20 0635  WBC 7.1 6.0 4.7  NEUTROABS  --  3.7 2.3  HGB 11.9* 11.3* 10.3*  HCT 37.7 37.3 33.2*  MCV 98.2 99.7 98.2  PLT  334 271 265   Cardiac Enzymes: No results for input(s): CKTOTAL, CKMB, CKMBINDEX, TROPONINI in the last 168 hours. BNP: Invalid input(s): POCBNP CBG: Recent Labs  Lab 07/03/20 0243 07/03/20 0315 07/03/20 0534 07/03/20 0739 07/03/20 1108  GLUCAP 61* 90 149* 159* 122*   HbA1C: No results for input(s): HGBA1C in the last 72 hours. Urine analysis:    Component Value Date/Time   COLORURINE YELLOW 02/09/2017 2030   APPEARANCEUR CLEAR 02/09/2017 2030   LABSPEC 1.009 02/09/2017 2030   PHURINE 7.0 02/09/2017 2030   GLUCOSEU NEGATIVE 02/09/2017 2030   HGBUR NEGATIVE 02/09/2017 2030   BILIRUBINUR NEGATIVE 02/09/2017 2030   KETONESUR NEGATIVE 02/09/2017 2030   PROTEINUR 30 (A) 02/09/2017 2030   UROBILINOGEN 0.2 02/05/2015 0528   NITRITE NEGATIVE 02/09/2017 2030   LEUKOCYTESUR MODERATE (A) 02/09/2017 2030   Sepsis Labs: @LABRCNTIP (procalcitonin:4,lacticidven:4) ) Recent Results (from the past 240 hour(s))  Wound or Superficial Culture     Status: None   Collection Time: 06/25/20 11:35 AM   Specimen: Foot; Wound  Result  Value Ref Range Status   Specimen Description   Final    FOOT LEFT Performed at [facility], [address] [place], [place] [address]    Special Requests   Final    NONE Performed at [facility], [address] [place], [place] [address]    Gram Stain   Final    NO WBC SEEN ABUNDANT GRAM POSITIVE COCCI FEW GRAM NEGATIVE RODS    Culture   Final    FEW GROUP B STREP(S.AGALACTIAE)ISOLATED TESTING AGAINST S. AGALACTIAE NOT ROUTINELY PERFORMED DUE TO PREDICTABILITY OF AMP/PEN/VAN SUSCEPTIBILITY. NO STAPHYLOCOCCUS AUREUS ISOLATED WITHIN MIXED CULTURE Performed at [facility], [address], [place], [place] [address]    Report Status 06/27/2020 FINAL  Final  Culture, blood (routine x 2)     Status: None   Collection Time: 06/25/20 12:01 PM   Specimen: BLOOD  Result Value Ref Range Status   Specimen Description BLOOD BLOOD LEFT HAND  Final    Special Requests   Final    BOTTLES DRAWN AEROBIC AND ANAEROBIC Blood Culture results may not be optimal due to an inadequate volume of blood received in culture bottles   Culture   Final    NO GROWTH 5 DAYS Performed at [facility], [address], [place], [place] [address]    Report Status 06/30/2020 FINAL  Final  Culture, blood (routine x 2)     Status: None   Collection Time: 06/25/20 12:01 PM   Specimen: BLOOD  Result Value Ref Range Status   Specimen Description BLOOD LEFT ANTECUBITAL  Final   Special Requests   Final    BOTTLES DRAWN AEROBIC AND ANAEROBIC Blood Culture adequate volume   Culture   Final    NO GROWTH 5 DAYS Performed at [facility], [address] [place], [place] [address]    Report Status 06/30/2020 FINAL  Final  SARS CORONAVIRUS 2 (Yuki Brunsman 6-24 HRS) Nasopharyngeal Nasopharyngeal Swab     Status: None   Collection Time: 06/25/20  2:00 PM   Specimen: Nasopharyngeal Swab  Result Value Ref Range Status   SARS Coronavirus 2 NEGATIVE NEGATIVE Final    Comment: (NOTE) SARS-CoV-2 target nucleic acids are NOT DETECTED.  The SARS-CoV-2 RNA is generally detectable in upper and lower respiratory specimens during the acute phase of infection. Negative results do not preclude SARS-CoV-2 infection, do not rule out co-infections with other pathogens, and should not be used as the sole basis for treatment or other patient management decisions. Negative results must be combined with clinical observations, patient history, and epidemiological information. The expected result is Negative.  Fact Sheet for Patients: HairSlick.no  Fact Sheet for Healthcare Providers: quierodirigir.com  This test is not yet approved or cleared by the [place] FDA and  has been authorized for detection and/or diagnosis of SARS-CoV-2 by FDA under an Emergency Use Authorization (EUA). This EUA will remain  in effect (meaning this test  can be used) for the duration of the COVID-19 declaration under Se ction 564(b)(1) of the Act, 21 U.S.C. section 360bbb-3(b)(1), unless the authorization is terminated or revoked sooner.  Performed at [facility], [address] [place], [place] [address]   MRSA PCR Screening     Status: Abnormal   Collection Time: 06/26/20  1:32 AM   Specimen: Nasal Mucosa; Nasopharyngeal  Result Value Ref Range Status   MRSA by PCR POSITIVE (A) NEGATIVE Final    Comment:        The GeneXpert  MRSA Assay (FDA approved for NASAL specimens only), is one component of a comprehensive MRSA colonization surveillance program. It is not intended to diagnose MRSA infection nor to guide or monitor treatment for MRSA infections. RESULT CALLED TO, READ BACK BY AND VERIFIED WITH: PEACH,RN@0520  06/26/20 [facility] [place] Performed at [facility], [address] [place], [place] [address]   SARS CORONAVIRUS 2 (Jeanne Terrance 6-24 HRS) Nasopharyngeal Nasopharyngeal Swab     Status: Abnormal   Collection Time: 07/01/20  8:52 AM   Specimen: Nasopharyngeal Swab  Result Value Ref Range Status   SARS Coronavirus 2 POSITIVE (A) NEGATIVE Final    Comment: (NOTE) SARS-CoV-2 target nucleic acids are DETECTED.  The SARS-CoV-2 RNA is generally detectable in upper and lower respiratory specimens during the acute phase of infection. Positive results are indicative of the presence of SARS-CoV-2 RNA. Clinical correlation with patient history and other diagnostic information is  necessary to determine patient infection status. Positive results do not rule out bacterial infection or co-infection with other viruses.  The expected result is Negative.  Fact Sheet for Patients: HairSlick.no  Fact Sheet for Healthcare Providers: quierodirigir.com  This test is not yet approved or cleared by the [place] FDA and  has been authorized for detection and/or diagnosis of SARS-CoV-2  by FDA under an Emergency Use Authorization (EUA). This EUA will remain  in effect (meaning this test can be used) for the duration of the COVID-19 declaration under Section 564(b)(1) of the Act, 21 U. S.C. section 360bbb-3(b)(1), unless the authorization is terminated or revoked sooner.   Performed at [facility], [address], [place], [place] [address]   Resp Panel by RT-PCR (Flu A&B, Covid) Nasopharyngeal Swab     Status: Abnormal   Collection Time: 07/01/20 12:05 PM   Specimen: Nasopharyngeal Swab; Nasopharyngeal(NP) swabs in vial transport medium  Result Value Ref Range Status   SARS Coronavirus 2 by RT PCR POSITIVE (A) NEGATIVE Final    Comment: RESULT CALLED TO, READ BACK BY AND VERIFIED WITH: THOMAS, C. 1/10 @ 1320 BY BEARD, S. (NOTE) SARS-CoV-2 target nucleic acids are DETECTED.  The SARS-CoV-2 RNA is generally detectable in upper respiratory specimens during the acute phase of infection. Positive results are indicative of the presence of the identified virus, but do not rule out bacterial infection or co-infection with other pathogens not detected by the test. Clinical correlation with patient history and other diagnostic information is necessary to determine patient infection status. The expected result is Negative.  Fact Sheet for Patients: BloggerCourse.com  Fact Sheet for Healthcare Providers: SeriousBroker.it  This test is not yet approved or cleared by the [place] FDA and  has been authorized for detection and/or diagnosis of SARS-CoV-2 by FDA under an Emergency Use Authorization (EUA).  This EUA will remain in effect (meaning this test can  be used) for the duration of  the COVID-19 declaration under Section 564(b)(1) of the Act, 21 U.S.C. section 360bbb-3(b)(1), unless the authorization is terminated or revoked sooner.     Influenza A by PCR NEGATIVE NEGATIVE Final   Influenza B by PCR  NEGATIVE NEGATIVE Final    Comment: (NOTE) The Xpert Xpress SARS-CoV-2/FLU/RSV plus assay is intended as an aid in the diagnosis of influenza from Nasopharyngeal swab specimens and should not be used as a sole basis for treatment. Nasal washings and aspirates are unacceptable for Xpert Xpress SARS-CoV-2/FLU/RSV testing.  Fact Sheet for Patients: BloggerCourse.com  Fact Sheet for Healthcare Providers: SeriousBroker.it  This test is not yet  approved or cleared by the [place] and has been authorized for detection and/or diagnosis of SARS-CoV-2 by FDA under an Emergency Use Authorization (EUA). This EUA will remain in effect (meaning this test can be used) for the duration of the COVID-19 declaration under Section 564(b)(1) of the Act, 21 U.S.C. section 360bbb-3(b)(1), unless the authorization is terminated or revoked.  Performed at [facility], [address], [place], [place] [address]      Scheduled Meds: . (feeding supplement) PROSource Plus  30 mL Oral Daily  . aspirin  81 mg Oral Daily  . bisoprolol  5 mg Oral Daily  . calcium-vitamin D  1 tablet Oral Daily  . Chlorhexidine Gluconate Cloth  6 each Topical Daily  . collagenase   Topical BID  . dextrose  1 ampule Intravenous Once  . diltiazem  120 mg Oral Daily  . enoxaparin (LOVENOX) injection  70 mg Subcutaneous Q12H  . furosemide  60 mg Oral BID  . insulin aspart  0-5 Units Subcutaneous QHS  . insulin aspart  0-9 Units Subcutaneous TID WC  . insulin NPH Human  30 Units Subcutaneous BID AC & HS  . isosorbide mononitrate  15 mg Oral Daily  . levothyroxine  137 mcg Oral Daily  . LORazepam  1 mg Intravenous Once  . multivitamin with minerals  1 tablet Oral Daily  . nicotine  7 mg Transdermal Daily  . pravastatin  20 mg Oral Daily  . sodium chloride flush  3 mL Intravenous Q12H  . spironolactone  12.5 mg Oral BID  . zinc sulfate  220 mg Oral Daily   Continuous  Infusions: . sodium chloride 250 mL (07/02/20 1625)  . cefTRIAXone (ROCEPHIN)  IV 2 g (07/02/20 1627)  . vancomycin      Procedures/Studies: IR Angiogram Extremity Left  Result Date: 07/01/2020 INDICATION: 85 year old female with left lower extremity wound dehiscence and PA D presents for angiogram and possible intervention EXAM: ULTRASOUND-GUIDED ACCESS RIGHT COMMON FEMORAL ARTERY PELVIC AND LEFT LOWER EXTREMITY ANGIOGRAM LASER ATHERECTOMY LEFT SFA AND LEFT PERONEAL ARTERY DRUG-ELUTING BALLOON ANGIOPLASTY LEFT SFA BALLOON ANGIOPLASTY LEFT PERONEAL ARTERY MEDICATIONS: 8000 units IV heparin. ANESTHESIA/SEDATION: Moderate (conscious) sedation was employed during this procedure. A total of Versed 3.0 mg and Fentanyl 150 mcg was administered intravenously. Moderate Sedation Time: 310 minutes. The patient's level of consciousness and vital signs were monitored continuously by radiology nursing throughout the procedure under my direct supervision. CONTRAST:  141 CC CONTRAST FLUOROSCOPY TIME:  Fluoroscopy Time: 28 minutes 0 seconds (104.8 mGy). COMPLICATIONS: None PROCEDURE: Informed consent was obtained from the patient following explanation of the procedure, risks, benefits and alternatives. The patient understands, agrees and consents for the procedure. All questions were addressed. A time out was performed prior to the initiation of the procedure. Maximal barrier sterile technique utilized including caps, mask, sterile gowns, sterile gloves, large sterile drape, hand hygiene, and Betadine prep. Ultrasound survey of the right inguinal region was performed with images stored and sent to PACs, confirming patency of the vessel. 1% lidocaine was used for local anesthesia. Small stab incision was made. Blunt dissection was performed. A micropuncture needle was used access the right common femoral artery under ultrasound. With excellent arterial blood flow returned, and an .018 micro wire was passed through the  needle, observed enter the abdominal aorta under fluoroscopy. The needle was removed, and a micropuncture sheath was placed over the wire. The inner dilator and wire were removed, and an 035 Bentson wire was advanced under  fluoroscopy into the abdominal aorta. The sheath was removed and a standard 5 [place] vascular sheath was placed. The dilator was removed and the sheath was flushed. Omni Flush catheter was advanced to the lower abdominal aorta. Angiogram was performed. Bentson wire was navigated into the left iliac system with the use of the Omni Flush catheter. Omni Flush catheter was removed and a 035 quick cross crossing catheter was advanced to the left common femoral artery. Angiogram of the left lower extremity was performed. Rose in wire was then placed through the crossing catheter and sheath exchange was made for a 6 French 45 cm straight tip to room 0 destination sheath. The sheath was position in the proximal superficial femoral artery, and the introducer and wire were removed. Given the disease segment of the peroneal artery contributing to the dorsalis pedis, we elected to use ultrasound-guided puncture distally for a retrograde approach. 1% lidocaine was used for local anesthesia. Ultrasound-guided access of the left dorsalis pedis was then performed with a V18 wire advanced through the needle retrograde. The wire was advanced to the proximal peroneal artery with the assistance of 018 crossing catheter. The wire and catheter were in a subintimal channel within the proximal peroneal artery. Catheter was withdrawn. A whisper wire was then used in attempt to find a luminal channel within the mid segment of the peroneal artery after withdrawal of the crossing catheter. We then used a Glidewire and an angled quick cross crossing catheter through the 6 [place] sheath. The catheter was navigated to the origin of the peroneal artery. 12 g crossing wire was then passed through the angle crossing catheter  antegrade through the peroneal artery with a distal position achieved. 2 mm balloon angioplasty was then performed in the proximal segment in order to create a connecting channel from the subintimal channel and the lumen of the proximal peroneal artery. We were then successful in navigating the retrograde directed 018 system into the true lumen of the peroneal artery and the tibioperoneal trunk. The quick cross catheter was then advanced retrograde to an angled vertebral catheter. A rendezvous was then performed with retrograde passage of a whisper wire. This wire was then ultimately externalized from both the pedal access and the right femoral sheath access. 014 crossing catheter was then passed distally over the wire. With a distal position achieved in the dorsalis pedis the wire was removed. Antegrade whisper wire was placed from the top down. Once the whisper wire was in position we initiated laser atherectomy of first the femoral segment, and then the tibial segment. 1.5 mm device was used for both the femoral and the tibial segment. After atherectomy was completed, balloon angioplasty was performed of the tibial segment, with 2.5 mm balloon angioplasty starting distally. We increased the diameter to a 3 mm segment proximally. Given some slow flow through the distal segment, repeat 2 mm balloon angioplasty was performed in the distal peroneal artery crossing the ankle joint into the dorsalis pedis. Drug-eluting balloon angioplasty was then performed in the femoropopliteal segment, with 5 mm x 120 mm. We observed a 3 minutes interval. After treatment of both segments, there was excellent response of the femoral segment with 0 residual stenosis. There was restoration of flow through the previously occluded tibial/peroneal segment into the dorsalis pedis. A non flow limiting dissection was present. We confirmed a palpable pulse at the foot in the dorsalis pedis segment and elected to withdraw at this point. The  destination sheath was exchanged for a short 6  French sheath at the right common femoral artery access point. We attempted to deploy a Celt hemostasis device, which ultimately failed and was withdrawn. Manual pressure of 25 minutes was used for hemostasis with a pressure dressing applied. Patient tolerated the procedure well and remained hemodynamically stable throughout. Blood loss was estimated 50 cc. FINDINGS: Ultrasound demonstrates significant calcifications of the right common femoral artery though patent. Pelvic angiogram demonstrates mild atherosclerosis of the lower abdominal aorta. Bilateral iliac arteries with mild atherosclerosis and no high-grade stenosis or occlusion. Hypogastric arteries patent bilaterally. Bilateral external iliac arteries patent with mild to moderate calcified plaque. Bilateral common femoral arteries patent. Proximal right SFA and profunda femoris patent. Angiogram left lower extremity demonstrates patent profunda femoris and thigh branches. Moderate to advanced atherosclerotic changes of the left SFA with high-grade 80% stenosis in the adductor canal. Popliteal artery patent without significant stenosis. Posterior tibial artery is patent with mild atherosclerosis. There is a paucity of flow from the posterior tibial artery into the common plantar artery. Variant anatomy of the tibioperoneal trunk with the TP trunk contributing to anterior tibial artery and peroneal artery. Peroneal artery is significantly disease with distal contribution to the dorsalis pedis. Anterior tibial artery is occluded in the mid segment with collateral flow. After treatment of the SFA there is 0% residual stenosis in the abductor canal. After treatment of the tibial segment, target peroneal artery, there is 100% improved perfusion into the dorsalis pedis. Non flow limiting dissection present in the TP trunk and peroneal artery, with a palpable pulse at the ankle. IMPRESSION: Status post ultrasound guided  access right common femoral artery and left dorsalis pedis for treatment of multi segment left-sided PAD contributing to poor wound healing, with laser atherectomy of both the femoral segment and occluded left peroneal artery, drug-eluting balloon angioplasty of SFA, and PTA of left peroneal artery restoring palpable dorsalis pedis pulse. Variant anatomy observed, with the TP trunk contributing to peroneal artery and anterior tibial artery, and the peroneal artery terminating in the dorsalis pedis. Signed, Yvone Neu. Reyne Dumas, RPVI [facility] Electronically Signed   By: Gilmer Mor D.O.   On: 07/01/2020 09:10   CT ANGIO AO+BIFEM W & OR WO CONTRAST  Result Date: 06/27/2020 CLINICAL DATA:  85 year old female with a history left foot infection EXAM: CT ANGIOGRAPHY OF ABDOMINAL AORTA WITH ILIOFEMORAL RUNOFF TECHNIQUE: Multidetector CT imaging of the abdomen, pelvis and lower extremities was performed using the standard protocol during bolus administration of intravenous contrast. Multiplanar CT image reconstructions and MIPs were obtained to evaluate the vascular anatomy. CONTRAST:  40mL OMNIPAQUE IOHEXOL 350 MG/ML SOLN COMPARISON:  Noninvasive 06/25/2020 FINDINGS: VASCULAR Aorta: Atherosclerotic changes of the lower thoracic aorta. Diameter of the aorta at the hiatus measures 2 cm. No dissection. No ulcerated plaque or pedunculated plaque. No periaortic inflammatory changes or fluid. Circumferential atherosclerotic calcifications of the abdominal aorta. Celiac: Celiac artery is patent, with configuration at the origin compatible with overlying compression of the cruise. SMA: SMA patent with mild atherosclerosis at the origin. Renals: - Right: Single right renal artery with mild to moderate atherosclerotic changes at the origin. No evidence of high-grade stenosis. - Left: Single left renal artery with mild atherosclerosis at the origin. IMA: IMA is patent.  Right lower extremity: Moderate atherosclerotic changes of the right iliac system. No dissection or aneurysm of the common iliac artery. No high-grade stenosis or occlusion. Hypogastric artery is patent. External iliac artery is patent with mild atherosclerosis. Common femoral  artery with mild atherosclerotic changes with calcified plaque on the posterior wall. Profunda femoris and the thigh branches are patent. Advanced atherosclerotic changes of the right SFA with multifocal calcified and soft plaque. Short segment critical stenosis in the proximal third. Additional high-grade stenosis in the mid third. At least 50% narrowing within the adductor canal. Popliteal artery is patent with moderate atherosclerotic changes. Proximal anterior tibial artery patent. Tibioperoneal trunk is patent with calcified plaque in the mid segment. Anterior tibial artery patent from the origin to the ankle with mild to moderate calcified plaque. Posterior tibial artery is small caliber with calcifications, appears to be occluded distally. Peroneal artery is patent proximally, small caliber, with some calcifications. Uncertain regarding patency distally. Left lower extremity: Moderate atherosclerotic changes of the left iliac system. No aneurysm, dissection, high-grade stenosis or occlusion. Hypogastric is patent with advanced atherosclerotic changes at the origin. External iliac artery patent with moderate atherosclerotic changes. Common femoral artery patent with calcified plaque on the posterior wall. Profunda femoris and thigh branches patent. Advanced atherosclerotic changes of the left SFA with multifocal narrowing. 50% narrowing short segment proximal third. Critical stenosis in the mid third at the abductor canal. Tandem critical stenosis in the above knee popliteal artery. Popliteal artery patent with circumferential mild plaque. Patency of the anterior tibial artery uncertain given the degree of calcifications. Tibioperoneal  trunk demonstrates at least moderate atherosclerotic calcifications and appears patent to the presumed site of the bifurcation. The length of the posterior tibial artery is not well visualized, except for linear calcifications in the expected location presumably occluded. Peroneal artery patency is uncertain. Veins: Unremarkable IVC and iliac veins. Engorged left great saphenous vein with tortuous accessory saphenous of the thigh associated with multiple engorged varicosities of the left calf. On the right there is a smaller network of superficial venous varicosities associated with the great saphenous network, including distal thigh and the calf. Perforator veins are evident associated with the peroneal vein of the calf. Review of the MIP images confirms the above findings. NON-VASCULAR Lower chest: No acute finding at the lung bases Hepatobiliary: Unremarkable appearance of the liver. Gallbladder is contracted with possible wall thickening. No radiopaque stones within the gallbladder lumen. No inflammatory changes or pericholecystic fluid. Pancreas: Unremarkable. Spleen: Unremarkable. Adrenals/Urinary Tract: - Right adrenal gland: Unremarkable - Left adrenal gland: Unremarkable. - Right kidney: No hydronephrosis, nephrolithiasis, inflammation, or ureteral dilation. No focal lesion. - Left Kidney: No hydronephrosis, nephrolithiasis, inflammation, or ureteral dilation. No focal lesion. - Urinary Bladder: Urinary bladder partially distended. Stomach/Bowel: - Stomach: Unremarkable. - Small bowel: No abnormally distended small bowel. Partially fecalized distal small bowel. No focal inflammatory changes or air-fluid levels. No mesenteric inflammatory changes. - Appendix: Appendix is not visualized, however, no inflammatory changes are present adjacent to the cecum to indicate an appendicitis. - Colon: Colonic diverticula, without associated inflammatory changes. Lymphatic: Enlarged lymph nodes of the left greater than  right inguinal nodal stations. Small lymph nodes along the left iliac nodal chain, associated with the left inguinal adenopathy and the left foot wound. Mesenteric: No free fluid or air. No mesenteric adenopathy. Reproductive: Unremarkable uterus/adnexa. Other: Fat containing umbilical hernia. Nonspecific pelvic floor laxity Musculoskeletal: Surgical changes of right hip fixation. Degenerative changes of the spine. Compression fracture of L1 with less than 50% height loss. Indeterminate age. Circumferential soft tissue swelling of the left greater than right calf. Ulceration evident at the plantar aspect of the left foot IMPRESSION: Multi segment PA D including: -aortic atherosclerosis.  Aortic Atherosclerosis (ICD10-I70.0). -  moderate bilateral iliac arterial disease without high-grade stenosis or occlusion of the CIA or EIA. Likely high-grade stenosis of left hypogastric origin. -advanced bilateral femoropopliteal disease, with multifocal high-grade SFA stenoses bilaterally. -bilateral tibial arterial disease. On the right the AT appears patent from the origin to the ankle, with uncertain patency of the PT and the peroneal artery. On the left, the peroneal artery is partially patent, with uncertain patency of the AT, PT, and distal peroneal artery. 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 Bilateral renal arterial disease without high-grade stenosis or occlusion. Evidence of bilateral, left greater than right venous insufficiency of the lower extremities. Left inguinal adenopathy and associated left iliac chain lymph nodes. These are presumably reactive given the known chronic left foot wound/ulceration. Additional ancillary findings as above. Signed, Yvone Neu. Reyne Dumas, RPVI [facility] Radiology Electronically Signed   By: Gilmer Mor D.O.   On: 06/27/2020 16:38   MR FOOT LEFT WO CONTRAST  Result Date: 07/01/2020 CLINICAL DATA:   Left foot pain for 3 months EXAM: MRI OF THE LEFT FOOT WITHOUT CONTRAST TECHNIQUE: Multiplanar, multisequence MR imaging of the left foot was performed. No intravenous contrast was administered. COMPARISON:  X-ray 06/25/2020 FINDINGS: Technical note: Examination is significantly degraded by motion artifact. Bones/Joint/Cartilage Bone marrow edema with confluent low T1 marrow signal within the plantar aspect of the cuboid (series 7 and 8, images 15-16). Poor definition of the overlying cortical signal. Patient is status post forefoot amputation. The remaining osseous structures of the midfoot and hindfoot are intact. No additional sites of bone marrow edema or osteomyelitis. No acute fracture or malalignment. No joint effusion. Ligaments Poorly evaluated.  No acute ligamentous injury is evident. Muscles and Tendons Post amputation changes to the flexor and extensor tendons. No tenosynovitis. Soft tissues Soft tissue ulceration at the plantar aspect of the left midfoot overlying the cuboid. There is associated soft tissue edema. No fluid collection or abscess. IMPRESSION: 1. Motion degraded exam. 2. Soft tissue ulceration at the left midfoot with acute osteomyelitis of the plantar aspect of the cuboid. 3. Prior forefoot amputation. No additional sites of acute osteomyelitis are identified. 4. No fluid collection or abscess. Electronically Signed   By: Duanne Guess D.O.   On: 07/01/2020 16:09   US ARTERIAL ABI (SCREENING LOWER EXTREMITY)  Result Date: 06/25/2020 CLINICAL DATA:  85 year old female with a history of foot infection EXAM: NONINVASIVE PHYSIOLOGIC VASCULAR STUDY OF BILATERAL LOWER EXTREMITIES TECHNIQUE: Evaluation of both lower extremities was performed at rest, including calculation of ankle-brachial indices, multiple segmental pressure evaluation, segmental Doppler and segmental pulse volume recording. COMPARISON:  None. FINDINGS: Right ABI:  0.74 Left ABI:  0.60 Right Lower Extremity: Monophasic  waveforms of the posterior tibial artery and dorsalis pedis. Left Lower Extremity: Monophasic waveforms of the posterior tibial artery and dorsalis pedis. IMPRESSION: Right: Resting ABI in the mild range of occlusive disease, although likely falsely elevated secondary to noncompressive vessels, given the monophasic waveforms at the ankles. Left: Resting ABI in the moderate range occlusive disease, likely falsely elevated secondary to noncompressible vessels given the monophasic waveforms at the ankles. Signed, Yvone Neu. Reyne Dumas, RPVI [facility] Radiology Electronically Signed   By: Gilmer Mor D.O.   On: 06/25/2020 15:45   IR FEM POP ART ATHERECT INC PTA MOD SED  Result Date: 07/01/2020 INDICATION: 85 year old female with left lower extremity wound dehiscence and PA D presents for angiogram and possible intervention EXAM: ULTRASOUND-GUIDED ACCESS  RIGHT COMMON FEMORAL ARTERY PELVIC AND LEFT LOWER EXTREMITY ANGIOGRAM LASER ATHERECTOMY LEFT SFA AND LEFT PERONEAL ARTERY DRUG-ELUTING BALLOON ANGIOPLASTY LEFT SFA BALLOON ANGIOPLASTY LEFT PERONEAL ARTERY MEDICATIONS: 8000 units IV heparin. ANESTHESIA/SEDATION: Moderate (conscious) sedation was employed during this procedure. A total of Versed 3.0 mg and Fentanyl 150 mcg was administered intravenously. Moderate Sedation Time: 310 minutes. The patient's level of consciousness and vital signs were monitored continuously by radiology nursing throughout the procedure under my direct supervision. CONTRAST:  141 CC CONTRAST FLUOROSCOPY TIME:  Fluoroscopy Time: 28 minutes 0 seconds (104.8 mGy). COMPLICATIONS: None PROCEDURE: Informed consent was obtained from the patient following explanation of the procedure, risks, benefits and alternatives. The patient understands, agrees and consents for the procedure. All questions were addressed. A time out was performed prior to the initiation of the procedure. Maximal barrier  sterile technique utilized including caps, mask, sterile gowns, sterile gloves, large sterile drape, hand hygiene, and Betadine prep. Ultrasound survey of the right inguinal region was performed with images stored and sent to PACs, confirming patency of the vessel. 1% lidocaine was used for local anesthesia. Small stab incision was made. Blunt dissection was performed. A micropuncture needle was used access the right common femoral artery under ultrasound. With excellent arterial blood flow returned, and an .018 micro wire was passed through the needle, observed enter the abdominal aorta under fluoroscopy. The needle was removed, and a micropuncture sheath was placed over the wire. The inner dilator and wire were removed, and an 035 Bentson wire was advanced under fluoroscopy into the abdominal aorta. The sheath was removed and a standard 5 [place] vascular sheath was placed. The dilator was removed and the sheath was flushed. Omni Flush catheter was advanced to the lower abdominal aorta. Angiogram was performed. Bentson wire was navigated into the left iliac system with the use of the Omni Flush catheter. Omni Flush catheter was removed and a 035 quick cross crossing catheter was advanced to the left common femoral artery. Angiogram of the left lower extremity was performed. Rose in wire was then placed through the crossing catheter and sheath exchange was made for a 6 French 45 cm straight tip to room 0 destination sheath. The sheath was position in the proximal superficial femoral artery, and the introducer and wire were removed. Given the disease segment of the peroneal artery contributing to the dorsalis pedis, we elected to use ultrasound-guided puncture distally for a retrograde approach. 1% lidocaine was used for local anesthesia. Ultrasound-guided access of the left dorsalis pedis was then performed with a V18 wire advanced through the needle retrograde. The wire was advanced to the proximal peroneal artery  with the assistance of 018 crossing catheter. The wire and catheter were in a subintimal channel within the proximal peroneal artery. Catheter was withdrawn. A whisper wire was then used in attempt to find a luminal channel within the mid segment of the peroneal artery after withdrawal of the crossing catheter. We then used a Glidewire and an angled quick cross crossing catheter through the 6 [place] sheath. The catheter was navigated to the origin of the peroneal artery. 12 g crossing wire was then passed through the angle crossing catheter antegrade through the peroneal artery with a distal position achieved. 2 mm balloon angioplasty was then performed in the proximal segment in order to create a connecting channel from the subintimal channel and the lumen of the proximal peroneal artery. We were then successful in navigating the retrograde directed 018 system into the true lumen of  the peroneal artery and the tibioperoneal trunk. The quick cross catheter was then advanced retrograde to an angled vertebral catheter. A rendezvous was then performed with retrograde passage of a whisper wire. This wire was then ultimately externalized from both the pedal access and the right femoral sheath access. 014 crossing catheter was then passed distally over the wire. With a distal position achieved in the dorsalis pedis the wire was removed. Antegrade whisper wire was placed from the top down. Once the whisper wire was in position we initiated laser atherectomy of first the femoral segment, and then the tibial segment. 1.5 mm device was used for both the femoral and the tibial segment. After atherectomy was completed, balloon angioplasty was performed of the tibial segment, with 2.5 mm balloon angioplasty starting distally. We increased the diameter to a 3 mm segment proximally. Given some slow flow through the distal segment, repeat 2 mm balloon angioplasty was performed in the distal peroneal artery crossing the ankle joint  into the dorsalis pedis. Drug-eluting balloon angioplasty was then performed in the femoropopliteal segment, with 5 mm x 120 mm. We observed a 3 minutes interval. After treatment of both segments, there was excellent response of the femoral segment with 0 residual stenosis. There was restoration of flow through the previously occluded tibial/peroneal segment into the dorsalis pedis. A non flow limiting dissection was present. We confirmed a palpable pulse at the foot in the dorsalis pedis segment and elected to withdraw at this point. The destination sheath was exchanged for a short 6 French sheath at the right common femoral artery access point. We attempted to deploy a Celt hemostasis device, which ultimately failed and was withdrawn. Manual pressure of 25 minutes was used for hemostasis with a pressure dressing applied. Patient tolerated the procedure well and remained hemodynamically stable throughout. Blood loss was estimated 50 cc. FINDINGS: Ultrasound demonstrates significant calcifications of the right common femoral artery though patent. Pelvic angiogram demonstrates mild atherosclerosis of the lower abdominal aorta. Bilateral iliac arteries with mild atherosclerosis and no high-grade stenosis or occlusion. Hypogastric arteries patent bilaterally. Bilateral external iliac arteries patent with mild to moderate calcified plaque. Bilateral common femoral arteries patent. Proximal right SFA and profunda femoris patent. Angiogram left lower extremity demonstrates patent profunda femoris and thigh branches. Moderate to advanced atherosclerotic changes of the left SFA with high-grade 80% stenosis in the adductor canal. Popliteal artery patent without significant stenosis. Posterior tibial artery is patent with mild atherosclerosis. There is a paucity of flow from the posterior tibial artery into the common plantar artery. Variant anatomy of the tibioperoneal trunk with the TP trunk contributing to anterior tibial  artery and peroneal artery. Peroneal artery is significantly disease with distal contribution to the dorsalis pedis. Anterior tibial artery is occluded in the mid segment with collateral flow. After treatment of the SFA there is 0% residual stenosis in the abductor canal. After treatment of the tibial segment, target peroneal artery, there is 100% improved perfusion into the dorsalis pedis. Non flow limiting dissection present in the TP trunk and peroneal artery, with a palpable pulse at the ankle. IMPRESSION: Status post ultrasound guided access right common femoral artery and left dorsalis pedis for treatment of multi segment left-sided PAD contributing to poor wound healing, with laser atherectomy of both the femoral segment and occluded left peroneal artery, drug-eluting balloon angioplasty of SFA, and PTA of left peroneal artery restoring palpable dorsalis pedis pulse. Variant anatomy observed, with the TP trunk contributing to peroneal artery and anterior tibial  artery, and the peroneal artery terminating in the dorsalis pedis. Signed, Yvone Neu. Reyne Dumas, RPVI [facility] Radiology Electronically Signed   By: Gilmer Mor D.O.   On: 07/01/2020 09:10   IR TIB-PERO ART ATHEREC INC PTA MOD SED  Result Date: 07/02/2020 INDICATION: 85 year old female with left lower extremity wound dehiscence and PA D presents for angiogram and possible intervention EXAM: ULTRASOUND-GUIDED ACCESS RIGHT COMMON FEMORAL ARTERY PELVIC AND LEFT LOWER EXTREMITY ANGIOGRAM LASER ATHERECTOMY LEFT SFA AND LEFT PERONEAL ARTERY DRUG-ELUTING BALLOON ANGIOPLASTY LEFT SFA BALLOON ANGIOPLASTY LEFT PERONEAL ARTERY MEDICATIONS: 8000 units IV heparin. ANESTHESIA/SEDATION: Moderate (conscious) sedation was employed during this procedure. A total of Versed 3.0 mg and Fentanyl 150 mcg was administered intravenously. Moderate Sedation Time: 310 minutes. The patient's level of consciousness and vital  signs were monitored continuously by radiology nursing throughout the procedure under my direct supervision. CONTRAST:  141 CC CONTRAST FLUOROSCOPY TIME:  Fluoroscopy Time: 28 minutes 0 seconds (104.8 mGy). COMPLICATIONS: None PROCEDURE: Informed consent was obtained from the patient following explanation of the procedure, risks, benefits and alternatives. The patient understands, agrees and consents for the procedure. All questions were addressed. A time out was performed prior to the initiation of the procedure. Maximal barrier sterile technique utilized including caps, mask, sterile gowns, sterile gloves, large sterile drape, hand hygiene, and Betadine prep. Ultrasound survey of the right inguinal region was performed with images stored and sent to PACs, confirming patency of the vessel. 1% lidocaine was used for local anesthesia. Small stab incision was made. Blunt dissection was performed. A micropuncture needle was used access the right common femoral artery under ultrasound. With excellent arterial blood flow returned, and an .018 micro wire was passed through the needle, observed enter the abdominal aorta under fluoroscopy. The needle was removed, and a micropuncture sheath was placed over the wire. The inner dilator and wire were removed, and an 035 Bentson wire was advanced under fluoroscopy into the abdominal aorta. The sheath was removed and a standard 5 [place] vascular sheath was placed. The dilator was removed and the sheath was flushed. Omni Flush catheter was advanced to the lower abdominal aorta. Angiogram was performed. Bentson wire was navigated into the left iliac system with the use of the Omni Flush catheter. Omni Flush catheter was removed and a 035 quick cross crossing catheter was advanced to the left common femoral artery. Angiogram of the left lower extremity was performed. Rose in wire was then placed through the crossing catheter and sheath exchange was made for a 6 French 45 cm straight  tip to room 0 destination sheath. The sheath was position in the proximal superficial femoral artery, and the introducer and wire were removed. Given the disease segment of the peroneal artery contributing to the dorsalis pedis, we elected to use ultrasound-guided puncture distally for a retrograde approach. 1% lidocaine was used for local anesthesia. Ultrasound-guided access of the left dorsalis pedis was then performed with a V18 wire advanced through the needle retrograde. The wire was advanced to the proximal peroneal artery with the assistance of 018 crossing catheter. The wire and catheter were in a subintimal channel within the proximal peroneal artery. Catheter was withdrawn. A whisper wire was then used in attempt to find a luminal channel within the mid segment of the peroneal artery after withdrawal of the crossing catheter. We then used a Glidewire and an angled quick cross crossing catheter through the 6 [place] sheath. The catheter was navigated to the origin  of the peroneal artery. 12 g crossing wire was then passed through the angle crossing catheter antegrade through the peroneal artery with a distal position achieved. 2 mm balloon angioplasty was then performed in the proximal segment in order to create a connecting channel from the subintimal channel and the lumen of the proximal peroneal artery. We were then successful in navigating the retrograde directed 018 system into the true lumen of the peroneal artery and the tibioperoneal trunk. The quick cross catheter was then advanced retrograde to an angled vertebral catheter. A rendezvous was then performed with retrograde passage of a whisper wire. This wire was then ultimately externalized from both the pedal access and the right femoral sheath access. 014 crossing catheter was then passed distally over the wire. With a distal position achieved in the dorsalis pedis the wire was removed. Antegrade whisper wire was placed from the top down. Once the  whisper wire was in position we initiated laser atherectomy of first the femoral segment, and then the tibial segment. 1.5 mm device was used for both the femoral and the tibial segment. After atherectomy was completed, balloon angioplasty was performed of the tibial segment, with 2.5 mm balloon angioplasty starting distally. We increased the diameter to a 3 mm segment proximally. Given some slow flow through the distal segment, repeat 2 mm balloon angioplasty was performed in the distal peroneal artery crossing the ankle joint into the dorsalis pedis. Drug-eluting balloon angioplasty was then performed in the femoropopliteal segment, with 5 mm x 120 mm. We observed a 3 minutes interval. After treatment of both segments, there was excellent response of the femoral segment with 0 residual stenosis. There was restoration of flow through the previously occluded tibial/peroneal segment into the dorsalis pedis. A non flow limiting dissection was present. We confirmed a palpable pulse at the foot in the dorsalis pedis segment and elected to withdraw at this point. The destination sheath was exchanged for a short 6 French sheath at the right common femoral artery access point. We attempted to deploy a Celt hemostasis device, which ultimately failed and was withdrawn. Manual pressure of 25 minutes was used for hemostasis with a pressure dressing applied. Patient tolerated the procedure well and remained hemodynamically stable throughout. Blood loss was estimated 50 cc. FINDINGS: Ultrasound demonstrates significant calcifications of the right common femoral artery though patent. Pelvic angiogram demonstrates mild atherosclerosis of the lower abdominal aorta. Bilateral iliac arteries with mild atherosclerosis and no high-grade stenosis or occlusion. Hypogastric arteries patent bilaterally. Bilateral external iliac arteries patent with mild to moderate calcified plaque. Bilateral common femoral arteries patent. Proximal right  SFA and profunda femoris patent. Angiogram left lower extremity demonstrates patent profunda femoris and thigh branches. Moderate to advanced atherosclerotic changes of the left SFA with high-grade 80% stenosis in the adductor canal. Popliteal artery patent without significant stenosis. Posterior tibial artery is patent with mild atherosclerosis. There is a paucity of flow from the posterior tibial artery into the common plantar artery. Variant anatomy of the tibioperoneal trunk with the TP trunk contributing to anterior tibial artery and peroneal artery. Peroneal artery is significantly disease with distal contribution to the dorsalis pedis. Anterior tibial artery is occluded in the mid segment with collateral flow. After treatment of the SFA there is 0% residual stenosis in the abductor canal. After treatment of the tibial segment, target peroneal artery, there is 100% improved perfusion into the dorsalis pedis. Non flow limiting dissection present in the TP trunk and peroneal artery, with a palpable pulse  at the ankle. IMPRESSION: Status post ultrasound guided access right common femoral artery and left dorsalis pedis for treatment of multi segment left-sided PAD contributing to poor wound healing, with laser atherectomy of both the femoral segment and occluded left peroneal artery, drug-eluting balloon angioplasty of SFA, and PTA of left peroneal artery restoring palpable dorsalis pedis pulse. Variant anatomy observed, with the TP trunk contributing to peroneal artery and anterior tibial artery, and the peroneal artery terminating in the dorsalis pedis. Signed, Yvone Neu. Reyne Dumas, RPVI Vascular and Interventional Radiology Specialists [facility] Radiology Electronically Signed   By: Gilmer Mor D.O.   On: 07/01/2020 09:10   IR US Guide Vasc Access Left  Result Date: 07/01/2020 INDICATION: 85 year old female with left lower extremity wound dehiscence and PA D presents for angiogram and possible  intervention EXAM: ULTRASOUND-GUIDED ACCESS RIGHT COMMON FEMORAL ARTERY PELVIC AND LEFT LOWER EXTREMITY ANGIOGRAM LASER ATHERECTOMY LEFT SFA AND LEFT PERONEAL ARTERY DRUG-ELUTING BALLOON ANGIOPLASTY LEFT SFA BALLOON ANGIOPLASTY LEFT PERONEAL ARTERY MEDICATIONS: 8000 units IV heparin. ANESTHESIA/SEDATION: Moderate (conscious) sedation was employed during this procedure. A total of Versed 3.0 mg and Fentanyl 150 mcg was administered intravenously. Moderate Sedation Time: 310 minutes. The patient's level of consciousness and vital signs were monitored continuously by radiology nursing throughout the procedure under my direct supervision. CONTRAST:  141 CC CONTRAST FLUOROSCOPY TIME:  Fluoroscopy Time: 28 minutes 0 seconds (104.8 mGy). COMPLICATIONS: None PROCEDURE: Informed consent was obtained from the patient following explanation of the procedure, risks, benefits and alternatives. The patient understands, agrees and consents for the procedure. All questions were addressed. A time out was performed prior to the initiation of the procedure. Maximal barrier sterile technique utilized including caps, mask, sterile gowns, sterile gloves, large sterile drape, hand hygiene, and Betadine prep. Ultrasound survey of the right inguinal region was performed with images stored and sent to PACs, confirming patency of the vessel. 1% lidocaine was used for local anesthesia. Small stab incision was made. Blunt dissection was performed. A micropuncture needle was used access the right common femoral artery under ultrasound. With excellent arterial blood flow returned, and an .018 micro wire was passed through the needle, observed enter the abdominal aorta under fluoroscopy. The needle was removed, and a micropuncture sheath was placed over the wire. The inner dilator and wire were removed, and an 035 Bentson wire was advanced under fluoroscopy into the abdominal aorta. The sheath was removed and a standard 5 [place] vascular sheath was  placed. The dilator was removed and the sheath was flushed. Omni Flush catheter was advanced to the lower abdominal aorta. Angiogram was performed. Bentson wire was navigated into the left iliac system with the use of the Omni Flush catheter. Omni Flush catheter was removed and a 035 quick cross crossing catheter was advanced to the left common femoral artery. Angiogram of the left lower extremity was performed. Rose in wire was then placed through the crossing catheter and sheath exchange was made for a 6 French 45 cm straight tip to room 0 destination sheath. The sheath was position in the proximal superficial femoral artery, and the introducer and wire were removed. Given the disease segment of the peroneal artery contributing to the dorsalis pedis, we elected to use ultrasound-guided puncture distally for a retrograde approach. 1% lidocaine was used for local anesthesia. Ultrasound-guided access of the left dorsalis pedis was then performed with a V18 wire advanced through the needle retrograde. The wire was advanced to the proximal peroneal artery with the assistance of 018  crossing catheter. The wire and catheter were in a subintimal channel within the proximal peroneal artery. Catheter was withdrawn. A whisper wire was then used in attempt to find a luminal channel within the mid segment of the peroneal artery after withdrawal of the crossing catheter. We then used a Glidewire and an angled quick cross crossing catheter through the 6 [place] sheath. The catheter was navigated to the origin of the peroneal artery. 12 g crossing wire was then passed through the angle crossing catheter antegrade through the peroneal artery with a distal position achieved. 2 mm balloon angioplasty was then performed in the proximal segment in order to create a connecting channel from the subintimal channel and the lumen of the proximal peroneal artery. We were then successful in navigating the retrograde directed 018 system into  the true lumen of the peroneal artery and the tibioperoneal trunk. The quick cross catheter was then advanced retrograde to an angled vertebral catheter. A rendezvous was then performed with retrograde passage of a whisper wire. This wire was then ultimately externalized from both the pedal access and the right femoral sheath access. 014 crossing catheter was then passed distally over the wire. With a distal position achieved in the dorsalis pedis the wire was removed. Antegrade whisper wire was placed from the top down. Once the whisper wire was in position we initiated laser atherectomy of first the femoral segment, and then the tibial segment. 1.5 mm device was used for both the femoral and the tibial segment. After atherectomy was completed, balloon angioplasty was performed of the tibial segment, with 2.5 mm balloon angioplasty starting distally. We increased the diameter to a 3 mm segment proximally. Given some slow flow through the distal segment, repeat 2 mm balloon angioplasty was performed in the distal peroneal artery crossing the ankle joint into the dorsalis pedis. Drug-eluting balloon angioplasty was then performed in the femoropopliteal segment, with 5 mm x 120 mm. We observed a 3 minutes interval. After treatment of both segments, there was excellent response of the femoral segment with 0 residual stenosis. There was restoration of flow through the previously occluded tibial/peroneal segment into the dorsalis pedis. A non flow limiting dissection was present. We confirmed a palpable pulse at the foot in the dorsalis pedis segment and elected to withdraw at this point. The destination sheath was exchanged for a short 6 French sheath at the right common femoral artery access point. We attempted to deploy a Celt hemostasis device, which ultimately failed and was withdrawn. Manual pressure of 25 minutes was used for hemostasis with a pressure dressing applied. Patient tolerated the procedure well and  remained hemodynamically stable throughout. Blood loss was estimated 50 cc. FINDINGS: Ultrasound demonstrates significant calcifications of the right common femoral artery though patent. Pelvic angiogram demonstrates mild atherosclerosis of the lower abdominal aorta. Bilateral iliac arteries with mild atherosclerosis and no high-grade stenosis or occlusion. Hypogastric arteries patent bilaterally. Bilateral external iliac arteries patent with mild to moderate calcified plaque. Bilateral common femoral arteries patent. Proximal right SFA and profunda femoris patent. Angiogram left lower extremity demonstrates patent profunda femoris and thigh branches. Moderate to advanced atherosclerotic changes of the left SFA with high-grade 80% stenosis in the adductor canal. Popliteal artery patent without significant stenosis. Posterior tibial artery is patent with mild atherosclerosis. There is a paucity of flow from the posterior tibial artery into the common plantar artery. Variant anatomy of the tibioperoneal trunk with the TP trunk contributing to anterior tibial artery and peroneal artery. Peroneal artery  is significantly disease with distal contribution to the dorsalis pedis. Anterior tibial artery is occluded in the mid segment with collateral flow. After treatment of the SFA there is 0% residual stenosis in the abductor canal. After treatment of the tibial segment, target peroneal artery, there is 100% improved perfusion into the dorsalis pedis. Non flow limiting dissection present in the TP trunk and peroneal artery, with a palpable pulse at the ankle. IMPRESSION: Status post ultrasound guided access right common femoral artery and left dorsalis pedis for treatment of multi segment left-sided PAD contributing to poor wound healing, with laser atherectomy of both the femoral segment and occluded left peroneal artery, drug-eluting balloon angioplasty of SFA, and PTA of left peroneal artery restoring palpable dorsalis  pedis pulse. Variant anatomy observed, with the TP trunk contributing to peroneal artery and anterior tibial artery, and the peroneal artery terminating in the dorsalis pedis. Signed, Jaime S. Reyne DumasWagner, DO, RPVI Vascular and InterventionaYvone Neul Radiology Specialists [facility] Electronically Signed   By: Gilmer MorJaime  Wagner D.O.   On: 07/01/2020 09:10   IR US Guide Vasc Access Right  Result Date: 07/01/2020 INDICATION: 85 year old female with left lower extremity wound dehiscence and PA D presents for angiogram and possible intervention EXAM: ULTRASOUND-GUIDED ACCESS RIGHT COMMON FEMORAL ARTERY PELVIC AND LEFT LOWER EXTREMITY ANGIOGRAM LASER ATHERECTOMY LEFT SFA AND LEFT PERONEAL ARTERY DRUG-ELUTING BALLOON ANGIOPLASTY LEFT SFA BALLOON ANGIOPLASTY LEFT PERONEAL ARTERY MEDICATIONS: 8000 units IV heparin. ANESTHESIA/SEDATION: Moderate (conscious) sedation was employed during this procedure. A total of Versed 3.0 mg and Fentanyl 150 mcg was administered intravenously. Moderate Sedation Time: 310 minutes. The patient's level of consciousness and vital signs were monitored continuously by radiology nursing throughout the procedure under my direct supervision. CONTRAST:  141 CC CONTRAST FLUOROSCOPY TIME:  Fluoroscopy Time: 28 minutes 0 seconds (104.8 mGy). COMPLICATIONS: None PROCEDURE: Informed consent was obtained from the patient following explanation of the procedure, risks, benefits and alternatives. The patient understands, agrees and consents for the procedure. All questions were addressed. A time out was performed prior to the initiation of the procedure. Maximal barrier sterile technique utilized including caps, mask, sterile gowns, sterile gloves, large sterile drape, hand hygiene, and Betadine prep. Ultrasound survey of the right inguinal region was performed with images stored and sent to PACs, confirming patency of the vessel. 1% lidocaine was used for local anesthesia. Small stab incision was made. Blunt  dissection was performed. A micropuncture needle was used access the right common femoral artery under ultrasound. With excellent arterial blood flow returned, and an .018 micro wire was passed through the needle, observed enter the abdominal aorta under fluoroscopy. The needle was removed, and a micropuncture sheath was placed over the wire. The inner dilator and wire were removed, and an 035 Bentson wire was advanced under fluoroscopy into the abdominal aorta. The sheath was removed and a standard 5 [place] vascular sheath was placed. The dilator was removed and the sheath was flushed. Omni Flush catheter was advanced to the lower abdominal aorta. Angiogram was performed. Bentson wire was navigated into the left iliac system with the use of the Omni Flush catheter. Omni Flush catheter was removed and a 035 quick cross crossing catheter was advanced to the left common femoral artery. Angiogram of the left lower extremity was performed. Rose in wire was then placed through the crossing catheter and sheath exchange was made for a 6 French 45 cm straight tip to room 0 destination sheath. The sheath was position in the proximal superficial femoral artery, and  the introducer and wire were removed. Given the disease segment of the peroneal artery contributing to the dorsalis pedis, we elected to use ultrasound-guided puncture distally for a retrograde approach. 1% lidocaine was used for local anesthesia. Ultrasound-guided access of the left dorsalis pedis was then performed with a V18 wire advanced through the needle retrograde. The wire was advanced to the proximal peroneal artery with the assistance of 018 crossing catheter. The wire and catheter were in a subintimal channel within the proximal peroneal artery. Catheter was withdrawn. A whisper wire was then used in attempt to find a luminal channel within the mid segment of the peroneal artery after withdrawal of the crossing catheter. We then used a Glidewire and an  angled quick cross crossing catheter through the 6 [place] sheath. The catheter was navigated to the origin of the peroneal artery. 12 g crossing wire was then passed through the angle crossing catheter antegrade through the peroneal artery with a distal position achieved. 2 mm balloon angioplasty was then performed in the proximal segment in order to create a connecting channel from the subintimal channel and the lumen of the proximal peroneal artery. We were then successful in navigating the retrograde directed 018 system into the true lumen of the peroneal artery and the tibioperoneal trunk. The quick cross catheter was then advanced retrograde to an angled vertebral catheter. A rendezvous was then performed with retrograde passage of a whisper wire. This wire was then ultimately externalized from both the pedal access and the right femoral sheath access. 014 crossing catheter was then passed distally over the wire. With a distal position achieved in the dorsalis pedis the wire was removed. Antegrade whisper wire was placed from the top down. Once the whisper wire was in position we initiated laser atherectomy of first the femoral segment, and then the tibial segment. 1.5 mm device was used for both the femoral and the tibial segment. After atherectomy was completed, balloon angioplasty was performed of the tibial segment, with 2.5 mm balloon angioplasty starting distally. We increased the diameter to a 3 mm segment proximally. Given some slow flow through the distal segment, repeat 2 mm balloon angioplasty was performed in the distal peroneal artery crossing the ankle joint into the dorsalis pedis. Drug-eluting balloon angioplasty was then performed in the femoropopliteal segment, with 5 mm x 120 mm. We observed a 3 minutes interval. After treatment of both segments, there was excellent response of the femoral segment with 0 residual stenosis. There was restoration of flow through the previously occluded  tibial/peroneal segment into the dorsalis pedis. A non flow limiting dissection was present. We confirmed a palpable pulse at the foot in the dorsalis pedis segment and elected to withdraw at this point. The destination sheath was exchanged for a short 6 French sheath at the right common femoral artery access point. We attempted to deploy a Celt hemostasis device, which ultimately failed and was withdrawn. Manual pressure of 25 minutes was used for hemostasis with a pressure dressing applied. Patient tolerated the procedure well and remained hemodynamically stable throughout. Blood loss was estimated 50 cc. FINDINGS: Ultrasound demonstrates significant calcifications of the right common femoral artery though patent. Pelvic angiogram demonstrates mild atherosclerosis of the lower abdominal aorta. Bilateral iliac arteries with mild atherosclerosis and no high-grade stenosis or occlusion. Hypogastric arteries patent bilaterally. Bilateral external iliac arteries patent with mild to moderate calcified plaque. Bilateral common femoral arteries patent. Proximal right SFA and profunda femoris patent. Angiogram left lower extremity demonstrates patent profunda femoris  and thigh branches. Moderate to advanced atherosclerotic changes of the left SFA with high-grade 80% stenosis in the adductor canal. Popliteal artery patent without significant stenosis. Posterior tibial artery is patent with mild atherosclerosis. There is a paucity of flow from the posterior tibial artery into the common plantar artery. Variant anatomy of the tibioperoneal trunk with the TP trunk contributing to anterior tibial artery and peroneal artery. Peroneal artery is significantly disease with distal contribution to the dorsalis pedis. Anterior tibial artery is occluded in the mid segment with collateral flow. After treatment of the SFA there is 0% residual stenosis in the abductor canal. After treatment of the tibial segment, target peroneal artery,  there is 100% improved perfusion into the dorsalis pedis. Non flow limiting dissection present in the TP trunk and peroneal artery, with a palpable pulse at the ankle. IMPRESSION: Status post ultrasound guided access right common femoral artery and left dorsalis pedis for treatment of multi segment left-sided PAD contributing to poor wound healing, with laser atherectomy of both the femoral segment and occluded left peroneal artery, drug-eluting balloon angioplasty of SFA, and PTA of left peroneal artery restoring palpable dorsalis pedis pulse. Variant anatomy observed, with the TP trunk contributing to peroneal artery and anterior tibial artery, and the peroneal artery terminating in the dorsalis pedis. Signed, Yvone Neu. Reyne Dumas, RPVI Vascular and Interventional Radiology Specialists [facility] Radiology Electronically Signed   By: Gilmer Mor D.O.   On: 07/01/2020 09:10   DG Foot Complete Left  Result Date: 06/25/2020 CLINICAL DATA:  Diabetic patient with a skin ulceration on the left foot. History of prior amputation. EXAM: LEFT FOOT - COMPLETE 3+ VIEW COMPARISON:  Plain films left ankle 11/18/2016. FINDINGS: Again seen is postoperative change of forefoot amputation. There is a large skin ulceration projecting inferior to the cuboid. There is subtle loss of cortical bone along the inferior margin of the cuboid worrisome for osteomyelitis. Multiple small calcifications are seen at the amputation site. On the frontal view, 2 ossific fragments are seen at the wound. IMPRESSION: Large skin wound on the plantar surface of the foot with findings worrisome for osteomyelitis of the cuboid. Electronically Signed   By: Drusilla Kanner M.D.   On: 06/25/2020 11:42   [place] EKG SITE RITE  Result Date: 07/02/2020 If Site Rite image not attached, placement could not be confirmed due to current cardiac rhythm.   Catarina Hartshorn, DO  [facility]  If 7PM-7AM, please contact night-coverage [email or web address] Password  [facility] 07/03/2020, 12:56 PM   LOS: 8 days

## 2020-07-03 NOTE — Progress Notes (Signed)
Inpatient Diabetes Program Recommendations  AACE/ADA: New Consensus Statement on Inpatient Glycemic Control (2015)  Target Ranges:  Prepandial:   less than 140 mg/dL      Peak postprandial:   less than 180 mg/dL (1-2 hours)      Critically ill patients:  140 - 180 mg/dL   Lab Results  Component Value Date   GLUCAP 159 (H) 07/03/2020   HGBA1C 9.6 (H) 06/25/2020    Review of Glycemic Control Results for CHINWE, LOPE (MRN [id number]) as of 07/03/2020 09:17  Ref. Range 07/03/2020 02:24 07/03/2020 02:43 07/03/2020 03:15 07/03/2020 05:34  Glucose-Capillary Latest Ref Range: 70 - 99 mg/dL 39 (LL) 61 (L) 90 916 (H)   Diabetes history: Type 2 DM Outpatient Diabetes medications: Byetta 10 mcg BID, NPH 40 units QAM/20 units QPM, Humalog 5-10 units TID per SSI, Jardiance 10 mg QD Current orders for Inpatient glycemic control: NPH 30 units BID, Novolog 0-9 units TID, Novolog 0-5 units QHS  Inpatient Diabetes Program Recommendations:    Noted hypoglycemia this AM of 39 mg/dL following increase to NPH dose.  Of note, on 1/10 QHS dose of NPH was refused by patient, hence why elevated yesterday AM.   Consider reducing dose of NPH back to 20 units BID.   Thanks, Lujean Rave, MSN, RNC-OB Diabetes Coordinator [phone] (8a-5p)

## 2020-07-23 ENCOUNTER — Encounter (HOSPITAL_BASED_OUTPATIENT_CLINIC_OR_DEPARTMENT_OTHER): Payer: Medicare Other | Attending: Internal Medicine | Admitting: Internal Medicine

## 2020-07-23 ENCOUNTER — Other Ambulatory Visit: Payer: Self-pay

## 2020-07-23 DIAGNOSIS — I872 Venous insufficiency (chronic) (peripheral): Secondary | ICD-10-CM | POA: Insufficient documentation

## 2020-07-23 DIAGNOSIS — E1151 Type 2 diabetes mellitus with diabetic peripheral angiopathy without gangrene: Secondary | ICD-10-CM | POA: Insufficient documentation

## 2020-07-23 DIAGNOSIS — I5032 Chronic diastolic (congestive) heart failure: Secondary | ICD-10-CM | POA: Insufficient documentation

## 2020-07-23 DIAGNOSIS — E11319 Type 2 diabetes mellitus with unspecified diabetic retinopathy without macular edema: Secondary | ICD-10-CM | POA: Insufficient documentation

## 2020-07-23 DIAGNOSIS — E11621 Type 2 diabetes mellitus with foot ulcer: Secondary | ICD-10-CM | POA: Diagnosis present

## 2020-07-23 DIAGNOSIS — I110 Hypertensive heart disease with heart failure: Secondary | ICD-10-CM | POA: Insufficient documentation

## 2020-07-23 DIAGNOSIS — L97523 Non-pressure chronic ulcer of other part of left foot with necrosis of muscle: Secondary | ICD-10-CM | POA: Diagnosis not present

## 2020-07-23 DIAGNOSIS — L97811 Non-pressure chronic ulcer of other part of right lower leg limited to breakdown of skin: Secondary | ICD-10-CM | POA: Diagnosis not present

## 2020-07-23 NOTE — Progress Notes (Signed)
Tara Flores, Tara Flores ([id number]) Visit Report for 07/23/2020 Abuse/Suicide Risk Screen Details Patient Name: Date of Service: Tara Flores, Tara Flores 07/23/2020 9:00 A M Medical Record Number: [id number] Patient Account Number: [id number] Date of Birth/Sex: Treating RN: [birth year] (85 y.o. Tommye Standard Primary Care Benna Arno: [name] Other Clinician: Referring Jettson Crable: Treating Kieli Golladay/Extender: Quenton Fetter in Treatment: 0 Abuse/Suicide Risk Screen Items Answer ABUSE RISK SCREEN: Has anyone close to you tried to hurt or harm you recentlyo No Do you feel uncomfortable with anyone in your familyo No Has anyone forced you do things that you didnt want to doo No Electronic Signature(s) Signed: 07/23/2020 4:51:35 PM By: Zenaida Deed RN, BSN Entered By: Zenaida Deed on 07/23/2020 09:21:56 -------------------------------------------------------------------------------- Activities of Daily Living Details Patient Name: Date of Service: Tara Flores, Tara Flores 07/23/2020 9:00 A M Medical Record Number: [id number] Patient Account Number: [id number] Date of Birth/Sex: Treating RN: 01-23-1936 (85 y.o. Tommye Standard Primary Care Sidda Humm: [name] Other Clinician: Referring Aleksa Catterton: Treating Andric Kerce/Extender: Quenton Fetter in Treatment: 0 Activities of Daily Living Items Answer Activities of Daily Living (Please select one for each item) Drive Automobile Not Able T Medications ake Need Assistance Use T elephone Completely Able Care for Appearance Need Assistance Use T oilet Completely Able Bath / Shower Need Assistance Dress Self Need Assistance Feed Self Completely Able Walk Not Able Get In / Out Bed Not Able Housework Not Able Prepare Meals Not Able Handle Money Need Assistance Shop for Self Not Able Electronic Signature(s) Signed: 07/23/2020 4:51:35 PM By: Zenaida Deed RN, BSN Entered By: Zenaida Deed on 07/23/2020  09:22:58 -------------------------------------------------------------------------------- Education Screening Details Patient Name: Date of Service: Tara Flores 07/23/2020 9:00 A M Medical Record Number: [id number] Patient Account Number: [id number] Date of Birth/Sex: Treating RN: 03-03-1936 (85 y.o. Tommye Standard Primary Care Marico Buckle: [name] Other Clinician: Referring Caroline Matters: Treating Sumiya Mamaril/Extender: Quenton Fetter in Treatment: 0 Primary Learner Assessed: Patient Learning Preferences/Education Level/Primary Language Learning Preference: Explanation, Demonstration Highest Education Level: College or Above Preferred Language: English Cognitive Barrier Language Barrier: No Translator Needed: No Memory Deficit: Yes Emotional Barrier: No Physical Barrier Impaired Vision: Yes Legally Blind Impaired Hearing: No Decreased Hand dexterity: No Knowledge/Comprehension Knowledge Level: High Comprehension Level: High Ability to understand written instructions: High Ability to understand verbal instructions: High Motivation Anxiety Level: Calm Cooperation: Cooperative Education Importance: Acknowledges Need Interest in Health Problems: Asks Questions Perception: Coherent Willingness to Engage in Self-Management High Activities: Readiness to Engage in Self-Management High Activities: Electronic Signature(s) Signed: 07/23/2020 4:51:35 PM By: Zenaida Deed RN, BSN Entered By: Zenaida Deed on 07/23/2020 09:24:13 -------------------------------------------------------------------------------- Fall Risk Assessment Details Patient Name: Date of Service: Tara Flores 07/23/2020 9:00 A M Medical Record Number: [id number] Patient Account Number: [id number] Date of Birth/Sex: Treating RN: 08-29-1935 (85 y.o. Tommye Standard Primary Care Bransen Fassnacht: [name] Other Clinician: Referring Ahmaad Neidhardt: Treating Jaydalee Bardwell/Extender: Quenton Fetter in Treatment: 0 Fall Risk Assessment Items Have you had 2 or more falls in the last 12 monthso 0 No Have you had any fall that resulted in injury in the last 12 monthso 0 No FALLS RISK SCREEN History of falling - immediate or within 3 months 25 Yes Secondary diagnosis (Do you have 2 or more medical diagnoseso) 0 No Ambulatory aid None/bed rest/wheelchair/nurse 0 Yes Crutches/cane/walker 0 No Furniture 0 No Intravenous therapy Access/Saline/Heparin Lock 0 No Gait/Transferring Normal/ bed rest/ wheelchair 0 Yes Weak (short steps with or without shuffle, stooped but  able to lift head while walking, may seek 0 No support from furniture) Impaired (short steps with shuffle, may have difficulty arising from chair, head down, impaired 0 No balance) Mental Status Oriented to own ability 0 Yes Electronic Signature(s) Signed: 07/23/2020 4:51:35 PM By: Zenaida Deed RN, BSN Entered By: Zenaida Deed on 07/23/2020 09:24:58 -------------------------------------------------------------------------------- Foot Assessment Details Patient Name: Date of Service: Tara Flores 07/23/2020 9:00 A M Medical Record Number: [id number] Patient Account Number: [id number] Date of Birth/Sex: Treating RN: August 31, 1935 (85 y.o. Tommye Standard Primary Care Jaston Havens: [name] Other Clinician: Referring Arabella Revelle: Treating Darlis Wragg/Extender: Quenton Fetter in Treatment: 0 Foot Assessment Items Site Locations + = Sensation present, - = Sensation absent, C = Callus, U = Ulcer R = Redness, W = Warmth, M = Maceration, PU = Pre-ulcerative lesion F = Fissure, S = Swelling, D = Dryness Assessment Right: Left: Other Deformity: No No Prior Foot Ulcer: No Yes Prior Amputation: No Yes Charcot Joint: No No Ambulatory Status: Non-ambulatory Assistance Device: Wheelchair Gait: Electronic Signature(s) Signed: 07/23/2020 4:51:35 PM By: Zenaida Deed  RN, BSN Entered By: Zenaida Deed on 07/23/2020 09:27:58 -------------------------------------------------------------------------------- Nutrition Risk Screening Details Patient Name: Date of Service: Tara Flores 07/23/2020 9:00 A M Medical Record Number: [id number] Patient Account Number: [id number] Date of Birth/Sex: Treating RN: June 05, 1936 (85 y.o. Tommye Standard Primary Care Delsie Amador: [name] Other Clinician: Referring Tyler Robidoux: Treating Lyndon Chenoweth/Extender: Quenton Fetter in Treatment: 0 Height (in): 61 Weight (lbs): Body Mass Index (BMI): Nutrition Risk Screening Items Score Screening NUTRITION RISK SCREEN: I have an illness or condition that made me change the kind and/or amount of food I eat 0 No I eat fewer than two meals per day 0 No I eat few fruits and vegetables, or milk products 0 No I have three or more drinks of beer, liquor or wine almost every day 0 No I have tooth or mouth problems that make it hard for me to eat 0 No I don't always have enough money to buy the food I need 0 No I eat alone most of the time 1 Yes I take three or more different prescribed or over-the-counter drugs a day 0 No Without wanting to, I have lost or gained 10 pounds in the last six months 0 No I am not always physically able to shop, cook and/or feed myself 0 No Nutrition Protocols Good Risk Protocol 0 No interventions needed Moderate Risk Protocol High Risk Proctocol Risk Level: Good Risk Score: 1 Electronic Signature(s) Signed: 07/23/2020 4:51:35 PM By: Zenaida Deed RN, BSN Entered By: Zenaida Deed on 07/23/2020 09:25:33

## 2020-07-29 NOTE — Progress Notes (Signed)
Tara Flores, Tara Flores ([id number]) Visit Report for 07/23/2020 Allergy List Details Patient Name: Date of Service: Tara Flores, Tara Flores Patient Account Number: [id number] Date of Birth/Sex: Treating RN: September 23, 1935 (85 y.o. Female) Zenaida Deed Primary Care Hence Derrick: [name] Other Clinician: Referring Corda Shutt: Treating Marquail Bradwell/Extender: Quenton Fetter in Treatment: 0 Allergies Active Allergies Avelox Type: Medication Brethine Type: Medication Keflex Type: Medication Allergy Notes Electronic Signature(s) Signed: 07/23/2020 4:51:35 PM By: Zenaida Deed RN, BSN Entered By: Zenaida Deed on 07/23/2020 09:09:48 -------------------------------------------------------------------------------- Arrival Information Details Patient Name: Date of Service: Tara Flores 07/23/2020 9:00 A M Medical Record Number: [id number] Patient Account Number: [id number] Date of Birth/Sex: Treating RN: Dec 18, 1935 (85 y.o. Female) Zenaida Deed Primary Care Cerria Randhawa: [name] Other Clinician: Referring Thanos Cousineau: Treating Emmelyn Schmale/Extender: Quenton Fetter in Treatment: 0 Visit Information Patient Arrived: Wheel Chair Arrival Time: 08:57 Accompanied By: facility staff Transfer Assistance: None Patient Identification Verified: Yes Secondary Verification Process Completed: Yes Patient Requires Transmission-Based Precautions: No Patient Has Alerts: Yes Patient Alerts: R ABI = .74, L ABI= .60 History Since Last Visit Has Compression in Place as Prescribed: Yes Electronic Signature(s) Signed: 07/23/2020 4:51:35 PM By: Zenaida Deed RN, BSN Entered By: Zenaida Deed on 07/23/2020 09:08:12 -------------------------------------------------------------------------------- Clinic Level of Care Assessment Details Patient Name: Date of Service: Tara Flores 07/23/2020 9:00 A M Medical Record  Number: [id number] Patient Account Number: [id number] Date of Birth/Sex: Treating RN: 10/14/1935 (85 y.o. Female) Fonnie Mu Primary Care Marvis Saefong: [name] Other Clinician: Referring Adie Vilar: Treating Jeniya Flannigan/Extender: Quenton Fetter in Treatment: 0 Clinic Level of Care Assessment Items TOOL 1 Quantity Score X- 1 0 Use when EandM and Procedure is performed on INITIAL visit ASSESSMENTS - Nursing Assessment / Reassessment X- 1 20 General Physical Exam (combine w/ comprehensive assessment (listed just below) when performed on new pt. evals) X- 1 25 Comprehensive Assessment (HX, ROS, Risk Assessments, Wounds Hx, etc.) ASSESSMENTS - Wound and Skin Assessment / Reassessment X- 1 10 Dermatologic / Skin Assessment (not related to wound area) ASSESSMENTS - Ostomy and/or Continence Assessment and Care []  - 0 Incontinence Assessment and Management []  - 0 Ostomy Care Assessment and Management (repouching, etc.) PROCESS - Coordination of Care []  - 0 Simple Patient / Family Education for ongoing care X- 1 20 Complex (extensive) Patient / Family Education for ongoing care X- 1 10 Staff obtains Chiropractor, Records, T Results / Process Orders est X- 1 10 Staff telephones HHA, Nursing Homes / Clarify orders / etc []  - 0 Routine Transfer to another Facility (non-emergent condition) []  - 0 Routine Hospital Admission (non-emergent condition) X- 1 15 New Admissions / Manufacturing engineer / Ordering NPWT Apligraf, etc. , []  - 0 Emergency Hospital Admission (emergent condition) PROCESS - Special Needs []  - 0 Pediatric / Minor Patient Management []  - 0 Isolation Patient Management []  - 0 Hearing / Language / Visual special needs []  - 0 Assessment of Community assistance (transportation, D/C planning, etc.) []  - 0 Additional assistance / Altered mentation []  - 0 Support Surface(s) Assessment (bed, cushion, seat, etc.) INTERVENTIONS -  Miscellaneous []  - 0 External ear exam []  - 0 Patient Transfer (multiple staff / Nurse, adult / Similar devices) []  - 0 Simple Staple / Suture removal (25 or less) []  - 0 Complex Staple / Suture removal (26 or more) []  - 0 Hypo/Hyperglycemic Management (do not check if billed separately) X- 1 15 Ankle / Brachial Index (ABI) - do not  check if billed separately Has the patient been seen at the hospital within the last three years: Yes Total Score: 125 Level Of Care: New/Established - Level 4 Electronic Signature(s) Signed: 07/29/2020 9:09:54 AM By: Fonnie Mu RN Signed: 07/29/2020 9:09:54 AM By: Fonnie Mu RN Entered By: Fonnie Mu on 07/23/2020 10:32:03 -------------------------------------------------------------------------------- Encounter Discharge Information Details Patient Name: Date of Service: Tara Flores 07/23/2020 9:00 A M Medical Record Number: [id number] Patient Account Number: [id number] Date of Birth/Sex: Treating RN: 08/19/1935 (85 y.o. Female) Zandra Abts Primary Care Ziyon Cedotal: [name] Other Clinician: Referring Jett Kulzer: Treating Sissi Padia/Extender: Quenton Fetter in Treatment: 0 Encounter Discharge Information Items Post Procedure Vitals Discharge Condition: Stable Temperature (F): 97.8 Ambulatory Status: Wheelchair Pulse (bpm): 81 Discharge Destination: Home Respiratory Rate (breaths/min): 18 Transportation: Private Auto Blood Pressure (mmHg): 120/63 Accompanied By: caregiver Schedule Follow-up Appointment: Yes Clinical Summary of Care: Patient Declined Electronic Signature(s) Signed: 07/23/2020 5:18:27 PM By: Zandra Abts RN, BSN Entered By: Zandra Abts on 07/23/2020 11:32:39 -------------------------------------------------------------------------------- Lower Extremity Assessment Details Patient Name: Date of Service: Tara Flores 07/23/2020 9:00 A M Medical Record Number:  [id number] Patient Account Number: [id number] Date of Birth/Sex: Treating RN: [birth year] (85 y.o. Female) Zenaida Deed Primary Care Trampus Mcquerry: [name] Other Clinician: Referring Koree Staheli: Treating Chassidy Layson/Extender: Quenton Fetter in Treatment: 0 Edema Assessment Assessed: [name]: No] [Right: No] Edema: [Left: Yes] [Right: Yes] Calf Left: Right: Point of Measurement: From Medial Instep 41 cm 39 cm Ankle Left: Right: Point of Measurement: From Medial Instep 21.5 cm 22.2 cm Vascular Assessment Pulses: Dorsalis Pedis Palpable: [Left:No] [Right:No] Blood Pressure: Brachial: [Left:120] [Right:120] Dorsalis Pedis: 78 Ankle: [Right:Dorsalis Pedis: 68] Posterior Tibial: 68 Ankle Brachial Index: [Left:0.65] [Right:0.57] Electronic Signature(s) Signed: 07/23/2020 4:51:35 PM By: Zenaida Deed RN, BSN Entered By: Zenaida Deed on 07/23/2020 09:41:17 -------------------------------------------------------------------------------- Multi Wound Chart Details Patient Name: Date of Service: Tara Flores 07/23/2020 9:00 A M Medical Record Number: [id number] Patient Account Number: [id number] Date of Birth/Sex: Treating RN: 1935-10-01 (85 y.o. Female) Fonnie Mu Primary Care Kaeden Mester: [name] Other Clinician: Referring Wacey Zieger: Treating Lauriel Helin/Extender: Quenton Fetter in Treatment: 0 Vital Signs Height(in): 61 Pulse(bpm): 81 Weight(lbs): 157 Blood Pressure(mmHg): 120/63 Body Mass Index(BMI): 30 Temperature(F): 97.8 Respiratory Rate(breaths/min): 18 Photos: [5:No Photos Left, Plantar Foot] [6:No Photos Left, Anterior Lower Leg] [7:No Photos Left, Medial Lower Leg] Wound Location: [5:Gradually Appeared] [6:Gradually Appeared] [7:Gradually Appeared] Wounding Event: [5:Diabetic Wound/Ulcer of the Lower] [6:Venous Leg Ulcer] [7:Venous Leg Ulcer] Primary Etiology: [5:Extremity N/A] [6:Diabetic Wound/Ulcer of the  Lower] [7:Diabetic Wound/Ulcer of the Lower] Secondary Etiology: [5:Cataracts, Chronic sinus] [6:Extremity Cataracts, Chronic sinus] [7:Extremity Cataracts, Chronic sinus] Comorbid History: [5:problems/congestion, Anemia, Asthma, problems/congestion, Anemia, Asthma, problems/congestion, Anemia, Asthma, Chronic Obstructive Pulmonary Disease (COPD), Arrhythmia, Congestive Heart Failure, Coronary Congestive Heart Failure,  Coronary Congestive Heart Failure, Coronary Artery Disease, Hypertension, Peripheral Arterial Disease, Colitis, Peripheral Arterial Disease, Colitis, Peripheral Arterial Disease, Colitis, Type II Diabetes, Osteoarthritis, Osteomyelitis, Neuropathy,  Confinement Anxiety 04/22/2020] [6:Chronic Obstructive Pulmonary Disease (COPD), Arrhythmia, Artery Disease, Hypertension, Type II Diabetes, Osteoarthritis, Osteomyelitis, Neuropathy, Confinement Anxiety 06/22/2020] [7:Chronic Obstructive Pulmonary Disease  (COPD), Arrhythmia, Artery Disease, Hypertension, Type II Diabetes, Osteoarthritis, Osteomyelitis, Neuropathy, Confinement Anxiety 06/22/2020] Date Acquired: [5:0] [6:0] [7:0] Weeks of Treatment: [5:Open] [6:Open] [7:Open] Wound Status: [5:4x5.8x0.8] [6:0.5x0.7x0.1] [7:1.7x1.2x0.1] Measurements L x W x D (cm) [5:18.221] [6:0.275] [7:1.602] A (cm) : rea [5:14.577] [6:0.027] [7:0.16] Volume (cm) : [5:9] Starting Position 1 (o'clock): [5:11] Ending Position 1 (o'clock): [5:1.4] Maximum Distance 1 (cm): [5:Yes] [6:No] [7:No]  Undermining: [5:Grade 3] [6:Full Thickness Without Exposed] [7:Full Thickness Without Exposed] Classification: [5:MRI] [6:Support Structures N/A] [7:Support Structures N/A] Wagner Verification: [5:Medium] [6:Medium] [7:Medium] Exudate A mount: [5:Serosanguineous] [6:Serous] [7:Serous] Exudate Type: [5:red, brown] [6:amber] [7:amber] Exudate Color: [5:Epibole] [6:Flat and Intact] [7:Flat and Intact] Wound Margin: [5:Medium (34-66%)] [6:Small (1-33%)] [7:None Present  (0%)] Granulation A mount: [5:Pink] [6:Pink] [7:N/A] Granulation Quality: [5:Medium (34-66%)] [6:Large (67-100%)] [7:Large (67-100%)] Necrotic A mount: [5:Adherent Slough] [6:Adherent Slough] [7:Eschar] Necrotic Tissue: [5:Fat Layer (Subcutaneous Tissue): Yes Fat Layer (Subcutaneous Tissue): Yes Fat Layer (Subcutaneous Tissue): Yes] Exposed Structures: [5:Fascia: No Tendon: No Muscle: No Joint: No Bone: No Small (1-33%)] [6:Fascia: No Tendon: No Muscle: No Joint: No Bone: No Small (1-33%)] [7:Fascia: No Tendon: No Muscle: No Joint: No Bone: No Small (1-33%)] Epithelialization: [5:Debridement - Excisional] [6:Debridement - Excisional] [7:Debridement - Excisional] Debridement: Pre-procedure Verification/Time Out 10:20 [6:10:24] [7:10:23] Taken: [5:Lidocaine] [6:Lidocaine] [7:Lidocaine] Pain Control: [5:Subcutaneous, Slough] [6:Subcutaneous, Slough] [7:Subcutaneous, Slough] Tissue Debrided: [5:Skin/Subcutaneous Tissue] [6:Skin/Subcutaneous Tissue] [7:Skin/Subcutaneous Tissue] Level: [5:23.2] [6:0.35] [7:2.04] Debridement A (sq cm): [5:rea Curette] [6:Curette] [7:Curette] Instrument: [5:Minimum] [6:Minimum] [7:Minimum] Bleeding: [5:Pressure] [6:Pressure] [7:Pressure] Hemostasis Achieved: [5:0] [6:0] [7:0] Procedural Pain: [5:0] [6:0] [7:0] Post Procedural Pain: [5:Procedure was tolerated well] [6:Procedure was tolerated well] [7:Procedure was tolerated well] Debridement Treatment Response: [5:4x5.8x0.8] [6:0.5x0.7x0.1] [7:1.7x1.2x0.1] Post Debridement Measurements L x W x D (cm) [5:14.577] [6:0.027] [7:0.16] Post Debridement Volume: (cm) [5:Debridement] [6:Debridement] [7:Debridement] Wound Number: 8 N/A N/A Photos: No Photos N/A N/A Right, Lateral Lower Leg N/A N/A Wound Location: Gradually Appeared N/A N/A Wounding Event: Venous Leg Ulcer N/A N/A Primary Etiology: Diabetic Wound/Ulcer of the Lower N/A N/A Secondary Etiology: Extremity Cataracts, Chronic sinus N/A N/A Comorbid  History: problems/congestion, Anemia, Asthma, Chronic Obstructive Pulmonary Disease (COPD), Arrhythmia, Congestive Heart Failure, Coronary Artery Disease, Hypertension, Peripheral Arterial Disease, Colitis, Type II Diabetes, Osteoarthritis, Osteomyelitis, Neuropathy, Confinement Anxiety 06/22/2020 N/A N/A Date Acquired: 0 N/A N/A Weeks of Treatment: Open N/A N/A Wound Status: 0.8x1x0.1 N/A N/A Measurements L x W x D (cm) 0.628 N/A N/A A (cm) : rea 0.063 N/A N/A Volume (cm) : No N/A N/A Undermining: Full Thickness Without Exposed N/A N/A Classification: Support Structures N/A N/A N/A [address] Verification: Medium N/A N/A Exudate A mount: Serous N/A N/A Exudate Type: amber N/A N/A Exudate Color: Flat and Intact N/A N/A Wound Margin: None Present (0%) N/A N/A Granulation A mount: N/A N/A N/A Granulation Quality: Large (67-100%) N/A N/A Necrotic A mount: Eschar N/A N/A Necrotic Tissue: Fat Layer (Subcutaneous Tissue): Yes N/A N/A Exposed Structures: Fascia: No Tendon: No Muscle: No Joint: No Bone: No Medium (34-66%) N/A N/A Epithelialization: Debridement - Excisional N/A N/A Debridement: Pre-procedure Verification/Time Out 10:22 N/A N/A Taken: Lidocaine N/A N/A Pain Control: Subcutaneous, Slough N/A N/A Tissue Debrided: Skin/Subcutaneous Tissue N/A N/A Level: 0.8 N/A N/A Debridement A (sq cm): rea Curette N/A N/A Instrument: Minimum N/A N/A Bleeding: Pressure N/A N/A Hemostasis A chieved: 0 N/A N/A Procedural Pain: 0 N/A N/A Post Procedural Pain: Procedure was tolerated well N/A N/A Debridement Treatment Response: 0.8x1x0.1 N/A N/A Post Debridement Measurements L x W x D (cm) 0.063 N/A N/A Post Debridement Volume: (cm) Debridement N/A N/A Procedures Performed: Treatment Notes Electronic Signature(s) Signed: 07/23/2020 4:57:44 PM By: Baltazar Najjar MD Signed: 07/29/2020 9:09:54 AM By: Fonnie Mu RN Entered By: Baltazar Najjar on  07/23/2020 10:34:45 -------------------------------------------------------------------------------- Multi-Disciplinary Care Plan Details Patient Name: Date of Service: Tara Flores 07/23/2020 9:00 A M Medical Record Number: [id number] Patient Account Number: [id number] Date of Birth/Sex: Treating RN: 04/19/1936 (84  y.o. Female) Fonnie Mu Primary Care Wyndham Santilli: [name] Other Clinician: Referring Denay Pleitez: Treating Lavene Penagos/Extender: Quenton Fetter in Treatment: 0 Active Inactive Orientation to the Wound Care Program Nursing Diagnoses: Knowledge deficit related to the wound healing center program Goals: Patient/caregiver will verbalize understanding of the Wound Healing Center Program Date Initiated: 07/23/2020 Target Resolution Date: 08/30/2020 Goal Status: Active Interventions: Provide education on orientation to the wound center Notes: Wound/Skin Impairment Nursing Diagnoses: Impaired tissue integrity Knowledge deficit related to ulceration/compromised skin integrity Goals: Patient will demonstrate a reduced rate of smoking or cessation of smoking Date Initiated: 07/23/2020 Target Resolution Date: 08/30/2020 Goal Status: Active Patient will have a decrease in wound volume by X% from date: (specify in notes) Date Initiated: 07/23/2020 Target Resolution Date: 08/30/2020 Goal Status: Active Patient/caregiver will verbalize understanding of skin care regimen Date Initiated: 07/23/2020 Target Resolution Date: 08/30/2020 Goal Status: Active Interventions: Assess patient/caregiver ability to obtain necessary supplies Assess patient/caregiver ability to perform ulcer/skin care regimen upon admission and as needed Assess ulceration(s) every visit Provide education on smoking Notes: Electronic Signature(s) Signed: 07/29/2020 9:09:54 AM By: Fonnie Mu RN Entered By: Fonnie Mu on 07/23/2020  10:15:44 -------------------------------------------------------------------------------- Pain Assessment Details Patient Name: Date of Service: Tara Flores 07/23/2020 9:00 A M Medical Record Number: [id number] Patient Account Number: [id number] Date of Birth/Sex: Treating RN: [birth year] (85 y.o. Female) Zenaida Deed Primary Care Michel Eskelson: [name] Other Clinician: Referring Maveryk Renstrom: Treating Daijah Scrivens/Extender: Quenton Fetter in Treatment: 0 Active Problems Location of Pain Severity and Description of Pain Patient Has Paino No Site Locations Rate the pain. Current Pain Level: 0 Pain Management and Medication Current Pain Management: Electronic Signature(s) Signed: 07/23/2020 4:51:35 PM By: Zenaida Deed RN, BSN Entered By: Zenaida Deed on 07/23/2020 09:52:17 -------------------------------------------------------------------------------- Patient/Caregiver Education Details Patient Name: Date of Service: Tara Flores, Tara L. 2/1/2022andnbsp9:00 A M Medical Record Number: [id number] Patient Account Number: [id number] Date of Birth/Gender: Treating RN: 1935-07-19 (85 y.o. Female) Fonnie Mu Primary Care Physician: [name] Other Clinician: Referring Physician: Treating Physician/Extender: Quenton Fetter in Treatment: 0 Education Assessment Education Provided To: Patient Education Topics Provided Smoking and Wound Healing: Methods: Explain/Verbal Responses: State content correctly Welcome T [facility]: o Methods: Explain/Verbal Responses: State content correctly Electronic Signature(s) Signed: 07/29/2020 9:09:54 AM By: Fonnie Mu RN Entered By: Fonnie Mu on 07/23/2020 10:15:58 -------------------------------------------------------------------------------- Wound Assessment Details Patient Name: Date of Service: Tara Flores 07/23/2020 9:00 A M Medical Record Number:  [id number] Patient Account Number: [id number] Date of Birth/Sex: Treating RN: May 05, 1936 (85 y.o. Female) Zenaida Deed Primary Care Freddy Kinne: [name] Other Clinician: Referring Anntonette Madewell: Treating Jerrik Housholder/Extender: Quenton Fetter in Treatment: 0 Wound Status Wound Number: 5 Primary Diabetic Wound/Ulcer of the Lower Extremity Etiology: Wound Location: Left, Plantar Foot Wound Open Wounding Event: Gradually Appeared Status: Date Acquired: 04/22/2020 Comorbid Cataracts, Chronic sinus problems/congestion, Anemia, Asthma, Weeks Of Treatment: 0 History: Chronic Obstructive Pulmonary Disease (COPD), Arrhythmia, Clustered Wound: No Congestive Heart Failure, Coronary Artery Disease, Hypertension, Peripheral Arterial Disease, Colitis, Type II Diabetes, Osteoarthritis, Osteomyelitis, Neuropathy, Confinement Anxiety Photos Photo Uploaded By: Benjaman Kindler on 07/26/2020 13:48:15 Wound Measurements Length: (cm) 4 % Redu Width: (cm) 5.8 % Redu Depth: (cm) 0.8 Epithe Area: (cm) 18.221 Tunne Volume: (cm) 14.577 Under Sta End Max ction in Area: ction in Volume: lialization: Small (1-33%) ling: No mining: Yes rting Position (o'clock): 9 ing Position (o'clock): 11 imum Distance: (cm) 1.4 Wound Description Classification: Grade 3 Foul Blair Hailey Verification: MRI [employer]  Wound Margin: Epibole Exudate Amount: Medium Exudate Type: Serosanguineous Exudate Color: red, brown dor After Cleansing: No /Fibrino Yes Wound Bed Granulation Amount: Medium (34-66%) Exposed Structure Granulation Quality: Pink Fascia Exposed: No Necrotic Amount: Medium (34-66%) Fat Layer (Subcutaneous Tissue) Exposed: Yes Necrotic Quality: Adherent Slough Tendon Exposed: No Muscle Exposed: No Joint Exposed: No Bone Exposed: No Treatment Notes Wound #5 (Foot) Wound Laterality: Plantar, Left Cleanser Soap and Water Discharge Instruction: May shower and wash wound with dial  antibacterial soap and water prior to dressing change. Wound Cleanser Discharge Instruction: Cleanse the wound with wound cleanser prior to applying a clean dressing using gauze sponges, not tissue or cotton balls. Peri-Wound Care Sween Lotion (Moisturizing lotion) Discharge Instruction: Apply moisturizing lotion as directed Topical Primary Dressing IODOFLEX 0.9% Cadexomer Iodine Pad 4x6 cm Discharge Instruction: Apply to wound bed as instructed Secondary Dressing Woven Gauze Sponge, Non-Sterile 4x4 in Discharge Instruction: Apply over primary dressing as directed. ABD Pad, 5x9 Discharge Instruction: Apply over primary dressing as directed. Secured With [employer] 4x5 (in/yd) Discharge Instruction: Secure with Coban as directed. Kerlix Roll Sterile, 4.5x3.1 (in/yd) Discharge Instruction: Secure with Kerlix as directed. 44M Medipore H Soft Cloth Surgical T 4 x 2 (in/yd) ape Discharge Instruction: Secure dressing with tape as directed. Compression Wrap Compression Stockings Add-Ons Electronic Signature(s) Signed: 07/23/2020 4:51:35 PM By: Zenaida Deed RN, BSN Entered By: Zenaida Deed on 07/23/2020 09:46:48 -------------------------------------------------------------------------------- Wound Assessment Details Patient Name: Date of Service: Tara Flores 07/23/2020 9:00 A M Medical Record Number: [id number] Patient Account Number: [id number] Date of Birth/Sex: Treating RN: [birth year] (85 y.o. Female) Zenaida Deed Primary Care Preslie Depasquale: [name] Other Clinician: Referring Jalik Gellatly: Treating Irvin Lizama/Extender: Quenton Fetter in Treatment: 0 Wound Status Wound Number: 6 Primary Venous Leg Ulcer Etiology: Wound Location: Left, Anterior Lower Leg Secondary Diabetic Wound/Ulcer of the Lower Extremity Wounding Event: Gradually Appeared Etiology: Date Acquired: 06/22/2020 Wound Open Weeks Of Treatment: 0 Status: Clustered  Wound: No Comorbid Cataracts, Chronic sinus problems/congestion, Anemia, Asthma, History: Chronic Obstructive Pulmonary Disease (COPD), Arrhythmia, Congestive Heart Failure, Coronary Artery Disease, Hypertension, Peripheral Arterial Disease, Colitis, Type II Diabetes, Osteoarthritis, Osteomyelitis, Neuropathy, Confinement Anxiety Photos Photo Uploaded By: Benjaman Kindler on 07/26/2020 13:48:02 Wound Measurements Length: (cm) 0.5 Width: (cm) 0.7 Depth: (cm) 0.1 Area: (cm) 0.275 Volume: (cm) 0.027 % Reduction in Area: % Reduction in Volume: Epithelialization: Small (1-33%) Tunneling: No Undermining: No Wound Description Classification: Full Thickness Without Exposed Support Structures Wound Margin: Flat and Intact Exudate Amount: Medium Exudate Type: Serous Exudate Color: amber Foul Odor After Cleansing: No Slough/Fibrino Yes Wound Bed Granulation Amount: Small (1-33%) Exposed Structure Granulation Quality: Pink Fascia Exposed: No Necrotic Amount: Large (67-100%) Fat Layer (Subcutaneous Tissue) Exposed: Yes Necrotic Quality: Adherent Slough Tendon Exposed: No Muscle Exposed: No Joint Exposed: No Bone Exposed: No Treatment Notes Wound #6 (Lower Leg) Wound Laterality: Left, Anterior Cleanser Soap and Water Discharge Instruction: May shower and wash wound with dial antibacterial soap and water prior to dressing change. Wound Cleanser Discharge Instruction: Cleanse the wound with wound cleanser prior to applying a clean dressing using gauze sponges, not tissue or cotton balls. Peri-Wound Care Sween Lotion (Moisturizing lotion) Discharge Instruction: Apply moisturizing lotion as directed Topical Primary Dressing IODOFLEX 0.9% Cadexomer Iodine Pad 4x6 cm Discharge Instruction: Apply to wound bed as instructed Secondary Dressing Woven Gauze Sponge, Non-Sterile 4x4 in Discharge Instruction: Apply over primary dressing as directed. ABD Pad, 5x9 Discharge Instruction:  Apply over primary dressing as directed. Secured With [employer]  4x5 (in/yd) Discharge Instruction: Secure with Coban as directed. Kerlix Roll Sterile, 4.5x3.1 (in/yd) Discharge Instruction: Secure with Kerlix as directed. 68M Medipore H Soft Cloth Surgical T 4 x 2 (in/yd) ape Discharge Instruction: Secure dressing with tape as directed. Compression Wrap Compression Stockings Add-Ons Electronic Signature(s) Signed: 07/23/2020 4:51:35 PM By: Zenaida Deed RN, BSN Entered By: Zenaida Deed on 07/23/2020 09:48:20 -------------------------------------------------------------------------------- Wound Assessment Details Patient Name: Date of Service: Tara Flores 07/23/2020 9:00 A M Medical Record Number: [id number] Patient Account Number: [id number] Date of Birth/Sex: Treating RN: September 12, 1935 (85 y.o. Female) Zenaida Deed Primary Care Dia Jefferys: [name] Other Clinician: Referring Joanmarie Tsang: Treating Cortney Mckinney/Extender: Quenton Fetter in Treatment: 0 Wound Status Wound Number: 7 Primary Venous Leg Ulcer Etiology: Wound Location: Left, Medial Lower Leg Secondary Diabetic Wound/Ulcer of the Lower Extremity Wounding Event: Gradually Appeared Etiology: Date Acquired: 06/22/2020 Wound Open Weeks Of Treatment: 0 Status: Clustered Wound: No Comorbid Cataracts, Chronic sinus problems/congestion, Anemia, Asthma, History: Chronic Obstructive Pulmonary Disease (COPD), Arrhythmia, Congestive Heart Failure, Coronary Artery Disease, Hypertension, Peripheral Arterial Disease, Colitis, Type II Diabetes, Osteoarthritis, Osteomyelitis, Neuropathy, Confinement Anxiety Photos Photo Uploaded By: Benjaman Kindler on 07/26/2020 13:48:02 Wound Measurements Length: (cm) 1.7 Width: (cm) 1.2 Depth: (cm) 0.1 Area: (cm) 1.602 Volume: (cm) 0.16 % Reduction in Area: % Reduction in Volume: Epithelialization: Small (1-33%) Tunneling: No Undermining:  No Wound Description Classification: Full Thickness Without Exposed Support Structu Wound Margin: Flat and Intact Exudate Amount: Medium Exudate Type: Serous Exudate Color: amber res Foul Odor After Cleansing: No Slough/Fibrino No Wound Bed Granulation Amount: None Present (0%) Exposed Structure Necrotic Amount: Large (67-100%) Fascia Exposed: No Necrotic Quality: Eschar Fat Layer (Subcutaneous Tissue) Exposed: Yes Tendon Exposed: No Muscle Exposed: No Joint Exposed: No Bone Exposed: No Treatment Notes Wound #7 (Lower Leg) Wound Laterality: Left, Medial Cleanser Soap and Water Discharge Instruction: May shower and wash wound with dial antibacterial soap and water prior to dressing change. Wound Cleanser Discharge Instruction: Cleanse the wound with wound cleanser prior to applying a clean dressing using gauze sponges, not tissue or cotton balls. Peri-Wound Care Sween Lotion (Moisturizing lotion) Discharge Instruction: Apply moisturizing lotion as directed Topical Primary Dressing IODOFLEX 0.9% Cadexomer Iodine Pad 4x6 cm Discharge Instruction: Apply to wound bed as instructed Secondary Dressing Woven Gauze Sponge, Non-Sterile 4x4 in Discharge Instruction: Apply over primary dressing as directed. ABD Pad, 5x9 Discharge Instruction: Apply over primary dressing as directed. Secured With [employer] 4x5 (in/yd) Discharge Instruction: Secure with Coban as directed. Kerlix Roll Sterile, 4.5x3.1 (in/yd) Discharge Instruction: Secure with Kerlix as directed. 68M Medipore H Soft Cloth Surgical T 4 x 2 (in/yd) ape Discharge Instruction: Secure dressing with tape as directed. Compression Wrap Compression Stockings Add-Ons Electronic Signature(s) Signed: 07/23/2020 4:51:35 PM By: Zenaida Deed RN, BSN Entered By: Zenaida Deed on 07/23/2020 09:50:25 -------------------------------------------------------------------------------- Wound Assessment Details Patient  Name: Date of Service: Tara Flores 07/23/2020 9:00 A M Medical Record Number: [id number] Patient Account Number: [id number] Date of Birth/Sex: Treating RN: Oct 17, 1935 (85 y.o. Female) Zenaida Deed Primary Care Kennadee Walthour: [name] Other Clinician: Referring Sylus Stgermain: Treating Terryn Redner/Extender: Quenton Fetter in Treatment: 0 Wound Status Wound Number: 8 Primary Venous Leg Ulcer Etiology: Wound Location: Right, Lateral Lower Leg Secondary Diabetic Wound/Ulcer of the Lower Extremity Wounding Event: Gradually Appeared Etiology: Date Acquired: 06/22/2020 Wound Open Weeks Of Treatment: 0 Status: Clustered Wound: No Comorbid Cataracts, Chronic sinus problems/congestion, Anemia, Asthma, History: Chronic Obstructive Pulmonary Disease (COPD), Arrhythmia, Congestive Heart Failure, Coronary Artery Disease,  Hypertension, Peripheral Arterial Disease, Colitis, Type II Diabetes, Osteoarthritis, Osteomyelitis, Neuropathy, Confinement Anxiety Photos Photo Uploaded By: Benjaman KindlerJones, Dedrick on 07/26/2020 13:47:41 Wound Measurements Length: (cm) 0.8 Width: (cm) 1 Depth: (cm) 0.1 Area: (cm) 0.628 Volume: (cm) 0.063 % Reduction in Area: % Reduction in Volume: Epithelialization: Medium (34-66%) Tunneling: No Undermining: No Wound Description Classification: Full Thickness Without Exposed Support Structures Wound Margin: Flat and Intact Exudate Amount: Medium Exudate Type: Serous Exudate Color: amber Foul Odor After Cleansing: No Slough/Fibrino No Wound Bed Granulation Amount: None Present (0%) Exposed Structure Necrotic Amount: Large (67-100%) Fascia Exposed: No Necrotic Quality: Eschar Fat Layer (Subcutaneous Tissue) Exposed: Yes Tendon Exposed: No Muscle Exposed: No Joint Exposed: No Bone Exposed: No Treatment Notes Wound #8 (Lower Leg) Wound Laterality: Right, Lateral Cleanser Soap and Water Discharge Instruction: May shower and wash wound with  dial antibacterial soap and water prior to dressing change. Wound Cleanser Discharge Instruction: Cleanse the wound with wound cleanser prior to applying a clean dressing using gauze sponges, not tissue or cotton balls. Peri-Wound Care Sween Lotion (Moisturizing lotion) Discharge Instruction: Apply moisturizing lotion as directed Topical Primary Dressing IODOFLEX 0.9% Cadexomer Iodine Pad 4x6 cm Discharge Instruction: Apply to wound bed as instructed Secondary Dressing Woven Gauze Sponge, Non-Sterile 4x4 in Discharge Instruction: Apply over primary dressing as directed. ABD Pad, 5x9 Discharge Instruction: Apply over primary dressing as directed. Secured With [employer] Self-Adherent Wrap 4x5 (in/yd) Discharge Instruction: Secure with Coban as directed. Kerlix Roll Sterile, 4.5x3.1 (in/yd) Discharge Instruction: Secure with Kerlix as directed. 15M Medipore H Soft Cloth Surgical T 4 x 2 (in/yd) ape Discharge Instruction: Secure dressing with tape as directed. Compression Wrap Compression Stockings Add-Ons Electronic Signature(s) Signed: 07/23/2020 4:51:35 PM By: Zenaida DeedBoehlein, Linda RN, BSN Entered By: Zenaida DeedBoehlein, Linda on 07/23/2020 09:51:59 -------------------------------------------------------------------------------- Vitals Details Patient Name: Date of Service: Tara PoeULRICH, Tara L. 07/23/2020 9:00 A M Medical Record Number: [id number] Patient Account Number: [id number] Date of Birth/Sex: Treating RN: 04/12/1936 (85 y.o. Female) Zenaida DeedBoehlein, Linda Primary Care Sylvester Salonga: Anson FretAriza, Fernando Other Clinician: Referring Liliah Dorian: Treating Jacquel Mccamish/Extender: Quenton Fetterobson, Michael Ariza, Fernando Weeks in Treatment: 0 Vital Signs Time Taken: 09:01 Temperature (F): 97.8 Height (in): 61 Pulse (bpm): 81 Source: Stated Respiratory Rate (breaths/min): 18 Weight (lbs): 157 Blood Pressure (mmHg): 120/63 Body Mass Index (BMI): 29.7 Reference Range: 80 - 120 mg / dl Electronic Signature(s) Signed: 07/23/2020  4:51:35 PM By: Zenaida DeedBoehlein, Linda RN, BSN Entered By: Zenaida DeedBoehlein, Linda on 07/23/2020 10:32:05

## 2020-07-29 NOTE — Progress Notes (Signed)
Tara Flores ([id number]) Visit Report for 07/23/2020 Chief Complaint Document Details Patient Name: Date of Service: Tara Flores, Tara Flores 07/23/2020 9:00 A M Medical Record Number: [id number] Patient Account Number: [id number] Date of Birth/Sex: Treating RN: 1936-03-12 (85 y.o. Female) Fonnie Mu Primary Care Provider: [name] Other Clinician: Referring Provider: Treating Provider/Extender: Quenton Fetter in Treatment: 0 Information Obtained from: Patient Chief Complaint spontaneous wounds both legs. no pain. 2 courses of Keflex. complication of dm, legally blid 07/23/2020; patient is here for review of the left plantar foot wound and wounds on her bilateral anterior lower legs Electronic Signature(s) Signed: 07/23/2020 4:57:44 PM By: Baltazar Najjar MD Entered By: Baltazar Najjar on 07/23/2020 10:36:23 -------------------------------------------------------------------------------- Debridement Details Patient Name: Date of Service: Tara Flores 07/23/2020 9:00 A M Medical Record Number: [id number] Patient Account Number: [id number] Date of Birth/Sex: Treating RN: 05/29/1936 (85 y.o. Female) Fonnie Mu Primary Care Provider: [name] Other Clinician: Referring Provider: Treating Provider/Extender: Quenton Fetter in Treatment: 0 Debridement Performed for Assessment: Wound #5 Left,Plantar Foot Performed By: Physician Maxwell Caul., MD Debridement Type: Debridement Severity of Tissue Pre Debridement: Fat layer exposed Level of Consciousness (Pre-procedure): Awake and Alert Pre-procedure Verification/Time Out Yes - 10:20 Taken: Start Time: 10:20 Pain Control: Lidocaine T Area Debrided (L x W): otal 4 (cm) x 5.8 (cm) = 23.2 (cm) Tissue and other material debrided: Viable, Non-Viable, Slough, Subcutaneous, Skin: Dermis , Skin: Epidermis, Slough Level: Skin/Subcutaneous Tissue Debridement Description:  Excisional Instrument: Curette Bleeding: Minimum Hemostasis Achieved: Pressure End Time: 10:21 Procedural Pain: 0 Post Procedural Pain: 0 Response to Treatment: Procedure was tolerated well Level of Consciousness (Post- Awake and Alert procedure): Post Debridement Measurements of Total Wound Length: (cm) 4 Width: (cm) 5.8 Depth: (cm) 0.8 Volume: (cm) 14.577 Character of Wound/Ulcer Post Debridement: Improved Severity of Tissue Post Debridement: Fat layer exposed Post Procedure Diagnosis Same as Pre-procedure Electronic Signature(s) Signed: 07/23/2020 4:57:44 PM By: Baltazar Najjar MD Signed: 07/29/2020 9:09:54 AM By: Fonnie Mu RN Entered By: Baltazar Najjar on 07/23/2020 10:35:04 -------------------------------------------------------------------------------- Debridement Details Patient Name: Date of Service: Tara Flores 07/23/2020 9:00 A M Medical Record Number: [id number] Patient Account Number: [id number] Date of Birth/Sex: Treating RN: 1936-04-10 (85 y.o. Female) Fonnie Mu Primary Care Provider: [name] Other Clinician: Referring Provider: Treating Provider/Extender: Quenton Fetter in Treatment: 0 Debridement Performed for Assessment: Wound #6 Left,Anterior Lower Leg Performed By: Physician Maxwell Caul., MD Debridement Type: Debridement Severity of Tissue Pre Debridement: Fat layer exposed Level of Consciousness (Pre-procedure): Awake and Alert Pre-procedure Verification/Time Out Yes - 10:24 Taken: Start Time: 10:24 Pain Control: Lidocaine T Area Debrided (L x W): otal 0.5 (cm) x 0.7 (cm) = 0.35 (cm) Tissue and other material debrided: Viable, Non-Viable, Slough, Subcutaneous, Skin: Dermis , Skin: Epidermis, Slough Level: Skin/Subcutaneous Tissue Debridement Description: Excisional Instrument: Curette Bleeding: Minimum Hemostasis Achieved: Pressure End Time: 10:24 Procedural Pain: 0 Post Procedural Pain:  0 Response to Treatment: Procedure was tolerated well Level of Consciousness (Post- Awake and Alert procedure): Post Debridement Measurements of Total Wound Length: (cm) 0.5 Width: (cm) 0.7 Depth: (cm) 0.1 Volume: (cm) 0.027 Character of Wound/Ulcer Post Debridement: Improved Severity of Tissue Post Debridement: Fat layer exposed Post Procedure Diagnosis Same as Pre-procedure Electronic Signature(s) Signed: 07/23/2020 4:57:44 PM By: Baltazar Najjar MD Signed: 07/29/2020 9:09:54 AM By: Fonnie Mu RN Entered By: Baltazar Najjar on 07/23/2020 10:35:17 -------------------------------------------------------------------------------- Debridement Details Patient Name: Date of Service: Tara Flores. 07/23/2020 9:00 A M Medical Record  Number: [id number] Patient Account Number: [id number] Date of Birth/Sex: Treating RN: 07/07/1935 (85 y.o. Female) Fonnie Mu Primary Care Provider: [name] Other Clinician: Referring Provider: Treating Provider/Extender: Quenton Fetter in Treatment: 0 Debridement Performed for Assessment: Wound #7 Left,Medial Lower Leg Performed By: Physician Maxwell Caul., MD Debridement Type: Debridement Severity of Tissue Pre Debridement: Fat layer exposed Level of Consciousness (Pre-procedure): Awake and Alert Pre-procedure Verification/Time Out Yes - 10:23 Taken: Start Time: 10:23 Pain Control: Lidocaine T Area Debrided (L x W): otal 1.7 (cm) x 1.2 (cm) = 2.04 (cm) Tissue and other material debrided: Viable, Non-Viable, Slough, Subcutaneous, Skin: Dermis , Skin: Epidermis, Slough Level: Skin/Subcutaneous Tissue Debridement Description: Excisional Instrument: Curette Bleeding: Minimum Hemostasis Achieved: Pressure End Time: 10:23 Procedural Pain: 0 Post Procedural Pain: 0 Response to Treatment: Procedure was tolerated well Level of Consciousness (Post- Awake and Alert procedure): Post Debridement  Measurements of Total Wound Length: (cm) 1.7 Width: (cm) 1.2 Depth: (cm) 0.1 Volume: (cm) 0.16 Character of Wound/Ulcer Post Debridement: Improved Severity of Tissue Post Debridement: Fat layer exposed Post Procedure Diagnosis Same as Pre-procedure Electronic Signature(s) Signed: 07/23/2020 4:57:44 PM By: Baltazar Najjar MD Signed: 07/29/2020 9:09:54 AM By: Fonnie Mu RN Entered By: Baltazar Najjar on 07/23/2020 10:35:28 -------------------------------------------------------------------------------- Debridement Details Patient Name: Date of Service: Tara Flores 07/23/2020 9:00 A M Medical Record Number: [id number] Patient Account Number: [id number] Date of Birth/Sex: Treating RN: 06/29/1935 (85 y.o. Female) Fonnie Mu Primary Care Provider: [name] Other Clinician: Referring Provider: Treating Provider/Extender: Quenton Fetter in Treatment: 0 Debridement Performed for Assessment: Wound #8 Right,Lateral Lower Leg Performed By: Physician Maxwell Caul., MD Debridement Type: Debridement Severity of Tissue Pre Debridement: Fat layer exposed Level of Consciousness (Pre-procedure): Awake and Alert Pre-procedure Verification/Time Out Yes - 10:22 Taken: Start Time: 10:22 Pain Control: Lidocaine T Area Debrided (L x W): otal 0.8 (cm) x 1 (cm) = 0.8 (cm) Tissue and other material debrided: Viable, Non-Viable, Slough, Subcutaneous, Skin: Dermis , Skin: Epidermis, Slough Level: Skin/Subcutaneous Tissue Debridement Description: Excisional Instrument: Curette Bleeding: Minimum Hemostasis Achieved: Pressure End Time: 10:22 Procedural Pain: 0 Post Procedural Pain: 0 Response to Treatment: Procedure was tolerated well Level of Consciousness (Post- Awake and Alert procedure): Post Debridement Measurements of Total Wound Length: (cm) 0.8 Width: (cm) 1 Depth: (cm) 0.1 Volume: (cm) 0.063 Character of Wound/Ulcer Post Debridement:  Improved Severity of Tissue Post Debridement: Fat layer exposed Post Procedure Diagnosis Same as Pre-procedure Electronic Signature(s) Signed: 07/23/2020 4:57:44 PM By: Baltazar Najjar MD Signed: 07/29/2020 9:09:54 AM By: Fonnie Mu RN Entered By: Baltazar Najjar on 07/23/2020 10:35:41 -------------------------------------------------------------------------------- HPI Details Patient Name: Date of Service: Tara Flores 07/23/2020 9:00 A M Medical Record Number: [id number] Patient Account Number: [id number] Date of Birth/Sex: Treating RN: [birth year] (85 y.o. Female) Fonnie Mu Primary Care Provider: [name] Other Clinician: Referring Provider: Treating Provider/Extender: Quenton Fetter in Treatment: 0 History of Present Illness Location: both legs Quality: Patient reports No Pain. Severity: Patient states wound(s) are getting better. Modifying Factors: has had keflex HPI Description: dm with neuropathy and retinopathy. stasis changes surround the wounds. edema is minimal. feet are very dry and pulsesless. abi bilat around 0.7 READMISSION 07/23/2020 This is an 85 year old woman who comes from [facility] skilled facility where she is apparently been a resident for 3 years. She was here and saw Dr. Wiliam Ke I believe in 2016 with wounds on her bilateral legs. At some point she had a left transmetatarsal amputation but  I do not really have any information on this. Furthermore I am not really certain what they have been dressing her wounds with. She has a large area on the plantar left foot and smaller areas on the bilateral anterior lower legs. She was hospitalized from 06/25/2020 through 07/03/2020 discovered to have a left foot ulcer with underlying osteomyelitis of the left cuboid. She was either seen or consulted with ID and she is on IV Rocephin and vancomycin with the Rocephin finishing on February 14 and the vancomycin on the 21st. I am not  really sure what follow-up she has. She was also revascularized during this hospitalization with an angiogram showing left SFA stenosis. She underwent stenting of the left SFA stenting of the PTA and left peroneal arteries. She refused an amputation on the left. I am not sure what they are putting on any of these wounds although she does have a kerlix Coban wrap. She is a poorly controlled type II diabetic she also is a continued smoker Past medical history includes atrial fibrillation, chronic venous insufficiency, type 2 diabetes with most recent hemoglobin A1c of 9.6, chronic diastolic heart failure, peripheral vascular disease, left foot transmet apparently in April 2018. Her post revascularization ABIs were 0.74 on the right and 0.60 on the left. Waveforms were monophasic this was on 06/25/2020. T oday we obtained 0.57 on the right and 0.6 5 on the left. MRI on 1/10 showed osteomyelitis of the plantar aspect of the cuboid. No additional sites of acute osteomyelitis noted prior forefoot amputation and no abscess I have reviewed her angiogram which resulted in a laser atherectomy of the left SFA and left peroneal artery. Angioplasty of the left SFA and angioplasty of the left peroneal I do not see mentions of stents however. Electronic Signature(s) Signed: 07/23/2020 4:57:44 PM By: Baltazar Najjar MD Entered By: Baltazar Najjar on 07/23/2020 10:55:46 -------------------------------------------------------------------------------- Physical Exam Details Patient Name: Date of Service: Tara Flores 07/23/2020 9:00 A M Medical Record Number: [id number] Patient Account Number: [id number] Date of Birth/Sex: Treating RN: 10-26-1935 (85 y.o. Female) Fonnie Mu Primary Care Provider: [name] Other Clinician: Referring Provider: Treating Provider/Extender: Quenton Fetter in Treatment: 0 Constitutional Sitting or standing Blood Pressure is within target range for  patient.. Pulse regular and within target range for patient.Marland Kitchen Respirations regular, non-labored and within target range.. Temperature is normal and within the target range for the patient.Marland Kitchen Appears in no distress. Cardiovascular Dorsalis pedis pulses were palpable but I could not feel a posterior tibial. Had some difficulty with a popliteal low I think they are present bilaterally. Faint femoral pulse on the left I could not feel the right. Edema in the bilateral lower extremities with significant stasis dermatitis diffusely but no tenderness no warmth. Notes Wound exam She has a substantial wound on the left plantar foot. Completely nonviable surface extensive debridement of the left foot with difficulty achieving hemostasis probably because of anticoagulation. There is no exposed bone but this is clearly down to the muscle layer of her foot She has chronic venous insufficiency with stasis dermatitis in both lower legs. We had one necrotic wound on the right and 2 on the left. All of these require debridement to clean up the wound surfaces. Electronic Signature(s) Signed: 07/23/2020 4:57:44 PM By: Baltazar Najjar MD Entered By: Baltazar Najjar on 07/23/2020 10:57:57 -------------------------------------------------------------------------------- Physician Orders Details Patient Name: Date of Service: Tara Flores 07/23/2020 9:00 A M Medical Record Number: [id number] Patient Account Number: [id number] Date of Birth/Sex:  Treating RN: 05/21/1936 (85 y.o. Female) Fonnie Mu Primary Care Provider: [name] Other Clinician: Referring Provider: Treating Provider/Extender: Quenton Fetter in Treatment: 0 Verbal / Phone Orders: No Diagnosis Coding Follow-up Appointments Return Appointment in 2 weeks. Bathing/ Shower/ Hygiene May shower with protection but do not get wound dressing(s) wet. - May bed bathe, or use cast protectors in shower. You can shower on  days that dressings get changed, just take dressings off first, shower, then dressings should get changed right after. Edema Control - Lymphedema / SCD / Other Elevate legs to the level of the heart or above for 30 minutes daily and/or when sitting, a frequency of: Avoid standing for long periods of time. Wound Treatment Wound #5 - Foot Wound Laterality: Plantar, Left Cleanser: Soap and Water 3 x Per Week/15 Days Discharge Instructions: May shower and wash wound with dial antibacterial soap and water prior to dressing change. Cleanser: Wound Cleanser (DME) (Generic) 3 x Per Week/15 Days Discharge Instructions: Cleanse the wound with wound cleanser prior to applying a clean dressing using gauze sponges, not tissue or cotton balls. Peri-Wound Care: Sween Lotion (Moisturizing lotion) 3 x Per Week/15 Days Discharge Instructions: Apply moisturizing lotion as directed Prim Dressing: IODOFLEX 0.9% Cadexomer Iodine Pad 4x6 cm (DME) (Generic) 3 x Per Week/15 Days ary Discharge Instructions: Apply to wound bed as instructed Secondary Dressing: Woven Gauze Sponge, Non-Sterile 4x4 in (DME) (Generic) 3 x Per Week/15 Days Discharge Instructions: Apply over primary dressing as directed. Secondary Dressing: ABD Pad, 5x9 (DME) 3 x Per Week/15 Days Discharge Instructions: Apply over primary dressing as directed. Compression Wrap: Kerlix Roll 4.5x3.1 (in/yd) (DME) (Generic) 3 x Per Week/15 Days Discharge Instructions: Apply Kerlix and Coban compression as directed. Compression Wrap: Coban Self-Adherent Wrap 4x5 (in/yd) (DME) (Generic) 3 x Per Week/15 Days Discharge Instructions: Apply over Kerlix as directed. Wound #6 - Lower Leg Wound Laterality: Left, Anterior Cleanser: Soap and Water 3 x Per Week/15 Days Discharge Instructions: May shower and wash wound with dial antibacterial soap and water prior to dressing change. Cleanser: Wound Cleanser (DME) (Generic) 3 x Per Week/15 Days Discharge Instructions:  Cleanse the wound with wound cleanser prior to applying a clean dressing using gauze sponges, not tissue or cotton balls. Peri-Wound Care: Sween Lotion (Moisturizing lotion) 3 x Per Week/15 Days Discharge Instructions: Apply moisturizing lotion as directed Prim Dressing: IODOFLEX 0.9% Cadexomer Iodine Pad 4x6 cm (DME) (Generic) 3 x Per Week/15 Days ary Discharge Instructions: Apply to wound bed as instructed Secondary Dressing: Woven Gauze Sponge, Non-Sterile 4x4 in (DME) (Generic) 3 x Per Week/15 Days Discharge Instructions: Apply over primary dressing as directed. Secondary Dressing: ABD Pad, 5x9 (DME) 3 x Per Week/15 Days Discharge Instructions: Apply over primary dressing as directed. Compression Wrap: Kerlix Roll 4.5x3.1 (in/yd) (DME) (Generic) 3 x Per Week/15 Days Discharge Instructions: Apply Kerlix and Coban compression as directed. Compression Wrap: Coban Self-Adherent Wrap 4x5 (in/yd) (DME) (Generic) 3 x Per Week/15 Days Discharge Instructions: Apply over Kerlix as directed. Wound #7 - Lower Leg Wound Laterality: Left, Medial Cleanser: Soap and Water 3 x Per Week/15 Days Discharge Instructions: May shower and wash wound with dial antibacterial soap and water prior to dressing change. Cleanser: Wound Cleanser (DME) (Generic) 3 x Per Week/15 Days Discharge Instructions: Cleanse the wound with wound cleanser prior to applying a clean dressing using gauze sponges, not tissue or cotton balls. Peri-Wound Care: Sween Lotion (Moisturizing lotion) 3 x Per Week/15 Days Discharge Instructions: Apply moisturizing lotion as directed  Prim Dressing: IODOFLEX 0.9% Cadexomer Iodine Pad 4x6 cm (DME) (Generic) 3 x Per Week/15 Days ary Discharge Instructions: Apply to wound bed as instructed Secondary Dressing: Woven Gauze Sponge, Non-Sterile 4x4 in (DME) (Generic) 3 x Per Week/15 Days Discharge Instructions: Apply over primary dressing as directed. Secondary Dressing: ABD Pad, 5x9 (DME) 3 x Per  Week/15 Days Discharge Instructions: Apply over primary dressing as directed. Compression Wrap: Kerlix Roll 4.5x3.1 (in/yd) (DME) (Generic) 3 x Per Week/15 Days Discharge Instructions: Apply Kerlix and Coban compression as directed. Compression Wrap: Coban Self-Adherent Wrap 4x5 (in/yd) (DME) (Generic) 3 x Per Week/15 Days Discharge Instructions: Apply over Kerlix as directed. Wound #8 - Lower Leg Wound Laterality: Right, Lateral Cleanser: Soap and Water 3 x Per Week/15 Days Discharge Instructions: May shower and wash wound with dial antibacterial soap and water prior to dressing change. Cleanser: Wound Cleanser (DME) (Generic) 3 x Per Week/15 Days Discharge Instructions: Cleanse the wound with wound cleanser prior to applying a clean dressing using gauze sponges, not tissue or cotton balls. Peri-Wound Care: Sween Lotion (Moisturizing lotion) 3 x Per Week/15 Days Discharge Instructions: Apply moisturizing lotion as directed Prim Dressing: IODOFLEX 0.9% Cadexomer Iodine Pad 4x6 cm (DME) (Generic) 3 x Per Week/15 Days ary Discharge Instructions: Apply to wound bed as instructed Secondary Dressing: Woven Gauze Sponge, Non-Sterile 4x4 in (DME) (Generic) 3 x Per Week/15 Days Discharge Instructions: Apply over primary dressing as directed. Secondary Dressing: ABD Pad, 5x9 (DME) 3 x Per Week/15 Days Discharge Instructions: Apply over primary dressing as directed. Compression Wrap: Kerlix Roll 4.5x3.1 (in/yd) (DME) (Generic) 3 x Per Week/15 Days Discharge Instructions: Apply Kerlix and Coban compression as directed. Compression Wrap: Coban Self-Adherent Wrap 4x5 (in/yd) (DME) (Generic) 3 x Per Week/15 Days Discharge Instructions: Apply over Kerlix as directed. Electronic Signature(s) Signed: 07/23/2020 4:57:44 PM By: Baltazar Najjar MD Signed: 07/29/2020 9:09:54 AM By: Fonnie Mu RN Entered By: Fonnie Mu on 07/23/2020  16:32:29 -------------------------------------------------------------------------------- Problem List Details Patient Name: Date of Service: Tara Flores 07/23/2020 9:00 A M Medical Record Number: [id number] Patient Account Number: [id number] Date of Birth/Sex: Treating RN: 09-24-1935 (85 y.o. Female) Fonnie Mu Primary Care Provider: [name] Other Clinician: Referring Provider: Treating Provider/Extender: Quenton Fetter in Treatment: 0 Active Problems ICD-10 Encounter Code Description Active Date MDM Diagnosis E11.621 Type 2 diabetes mellitus with foot ulcer 07/23/2020 No Yes E11.51 Type 2 diabetes mellitus with diabetic peripheral angiopathy without gangrene 07/23/2020 No Yes I87.333 Chronic venous hypertension (idiopathic) with ulcer and inflammation of 07/23/2020 No Yes bilateral lower extremity L97.523 Non-pressure chronic ulcer of other part of left foot with necrosis of muscle 07/23/2020 No Yes L97.811 Non-pressure chronic ulcer of other part of right lower leg limited to breakdown 07/23/2020 No Yes of skin L97.121 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 07/23/2020 No Yes Inactive Problems Resolved Problems Electronic Signature(s) Signed: 07/23/2020 4:57:44 PM By: Baltazar Najjar MD Entered By: Baltazar Najjar on 07/23/2020 10:34:30 -------------------------------------------------------------------------------- Progress Note Details Patient Name: Date of Service: Tara Flores 07/23/2020 9:00 A M Medical Record Number: [id number] Patient Account Number: [id number] Date of Birth/Sex: Treating RN: 1936-03-06 (85 y.o. Female) Fonnie Mu Primary Care Provider: [name] Other Clinician: Referring Provider: Treating Provider/Extender: Quenton Fetter in Treatment: 0 Subjective Chief Complaint Information obtained from Patient spontaneous wounds both legs. no pain. 2 courses of  Keflex. complication of dm, legally blid 07/23/2020; patient is here for review of the left plantar foot wound and wounds  on her bilateral anterior lower legs History of Present Illness (HPI) The following HPI elements were documented for the patient's wound: Location: both legs Quality: Patient reports No Pain. Severity: Patient states wound(s) are getting better. Modifying Factors: has had keflex dm with neuropathy and retinopathy. stasis changes surround the wounds. edema is minimal. feet are very dry and pulsesless. abi bilat around 0.7 READMISSION 07/23/2020 This is an 85 year old woman who comes from [facility] skilled facility where she is apparently been a resident for 3 years. She was here and saw Dr. Wiliam Ke I believe in 2016 with wounds on her bilateral legs. At some point she had a left transmetatarsal amputation but I do not really have any information on this. Furthermore I am not really certain what they have been dressing her wounds with. She has a large area on the plantar left foot and smaller areas on the bilateral anterior lower legs. She was hospitalized from 06/25/2020 through 07/03/2020 discovered to have a left foot ulcer with underlying osteomyelitis of the left cuboid. She was either seen or consulted with ID and she is on IV Rocephin and vancomycin with the Rocephin finishing on February 14 and the vancomycin on the 21st. I am not really sure what follow-up she has. She was also revascularized during this hospitalization with an angiogram showing left SFA stenosis. She underwent stenting of the left SFA stenting of the PTA and left peroneal arteries. She refused an amputation on the left. I am not sure what they are putting on any of these wounds although she does have a kerlix Coban wrap. She is a poorly controlled type II diabetic she also is a continued smoker Past medical history includes atrial fibrillation, chronic venous insufficiency, type 2 diabetes with most recent  hemoglobin A1c of 9.6, chronic diastolic heart failure, peripheral vascular disease, left foot transmet apparently in April 2018. Her post revascularization ABIs were 0.74 on the right and 0.60 on the left. Waveforms were monophasic this was on 06/25/2020. T oday we obtained 0.57 on the right and 0.6 5 on the left. MRI on 1/10 showed osteomyelitis of the plantar aspect of the cuboid. No additional sites of acute osteomyelitis noted prior forefoot amputation and no abscess I have reviewed her angiogram which resulted in a laser atherectomy of the left SFA and left peroneal artery. Angioplasty of the left SFA and angioplasty of the left peroneal I do not see mentions of stents however. Patient History Information obtained from Patient. Allergies Avelox, Brethine, Keflex Family History Cancer - Mother, Diabetes - Mother,Siblings, Hypertension - Mother, Thyroid Problems - Mother, No family history of Hereditary Spherocytosis, Tuberculosis. Social History Current every day smoker, Marital Status - Widowed, Alcohol Use - Rarely, Drug Use - No History, Caffeine Use - Daily. Medical History Eyes Patient has history of Cataracts - surgery Denies history of Glaucoma, Optic Neuritis Ear/Nose/Mouth/Throat Patient has history of Chronic sinus problems/congestion - seasonal allergies Hematologic/Lymphatic Patient has history of Anemia Denies history of Lymphedema Respiratory Patient has history of Asthma, Chronic Obstructive Pulmonary Disease (COPD) Denies history of Aspiration, Pneumothorax, Sleep Apnea, Tuberculosis Cardiovascular Patient has history of Arrhythmia - afib, Congestive Heart Failure, Coronary Artery Disease, Hypertension, Peripheral Arterial Disease Gastrointestinal Patient has history of Colitis Endocrine Patient has history of Type II Diabetes Denies history of Type I Diabetes Genitourinary Denies history of End Stage Renal Disease Integumentary (Skin) Denies history of  History of Burn Musculoskeletal Patient has history of Osteoarthritis, Osteomyelitis - left foot Denies history of Gout, Rheumatoid  Arthritis Neurologic Patient has history of Neuropathy Oncologic Denies history of Received Chemotherapy, Received Radiation Psychiatric Patient has history of Confinement Anxiety Denies history of Anorexia/bulimia Patient is treated with Insulin. Blood sugar is tested. Hospitalization/Surgery History - left transmet amputation. - appendectomy. - cardiac cath. - right intertrochanteri nailing. - eye surgery. - right hip replacement. Medical A Surgical History Notes nd Constitutional Symptoms (General Health) obesity Eyes Macular degeneration, retinopathy, legally blind Respiratory emphysema Cardiovascular hyperlipidemia Neurologic TIA Review of Systems (ROS) Constitutional Symptoms (General Health) Denies complaints or symptoms of Fatigue, Fever, Chills, Marked Weight Change. Ear/Nose/Mouth/Throat Denies complaints or symptoms of Chronic sinus problems or rhinitis. Respiratory Complains or has symptoms of Shortness of Breath. Denies complaints or symptoms of Chronic or frequent coughs. Gastrointestinal Denies complaints or symptoms of Frequent diarrhea, Nausea, Vomiting. Endocrine Denies complaints or symptoms of Heat/cold intolerance. Genitourinary Denies complaints or symptoms of Frequent urination. Integumentary (Skin) Complains or has symptoms of Wounds - left foot, bil lower legs. Musculoskeletal Complains or has symptoms of Muscle Weakness. Neurologic Complains or has symptoms of Numbness/parasthesias. Psychiatric Complains or has symptoms of Claustrophobia - mild. Denies complaints or symptoms of Suicidal. Objective Constitutional Sitting or standing Blood Pressure is within target range for patient.. Pulse regular and within target range for patient.Marland Kitchen Respirations regular, non-labored and within target range.. Temperature is  normal and within the target range for the patient.Marland Kitchen Appears in no distress. Vitals Time Taken: 9:01 AM, Height: 61 in, Source: Stated, Weight: 157 lbs, BMI: 29.7, Temperature: 97.8 F, Pulse: 81 bpm, Respiratory Rate: 18 breaths/min, Blood Pressure: 120/63 mmHg. Cardiovascular Dorsalis pedis pulses were palpable but I could not feel a posterior tibial. Had some difficulty with a popliteal low I think they are present bilaterally. Faint femoral pulse on the left I could not feel the right. Edema in the bilateral lower extremities with significant stasis dermatitis diffusely but no tenderness no warmth. General Notes: Wound exam ooShe has a substantial wound on the left plantar foot. Completely nonviable surface extensive debridement of the left foot with difficulty achieving hemostasis probably because of anticoagulation. There is no exposed bone but this is clearly down to the muscle layer of her foot ooShe has chronic venous insufficiency with stasis dermatitis in both lower legs. We had one necrotic wound on the right and 2 on the left. All of these require debridement to clean up the wound surfaces. Integumentary (Hair, Skin) Wound #5 status is Open. Original cause of wound was Gradually Appeared. The wound is located on the Left,Plantar Foot. The wound measures 4cm length x 5.8cm width x 0.8cm depth; 18.221cm^2 area and 14.577cm^3 volume. There is Fat Layer (Subcutaneous Tissue) exposed. There is no tunneling noted, however, there is undermining starting at 9:00 and ending at 11:00 with a maximum distance of 1.4cm. There is a medium amount of serosanguineous drainage noted. The wound margin is epibole. There is medium (34-66%) pink granulation within the wound bed. There is a medium (34-66%) amount of necrotic tissue within the wound bed including Adherent Slough. Wound #6 status is Open. Original cause of wound was Gradually Appeared. The wound is located on the Left,Anterior Lower Leg. The  wound measures 0.5cm length x 0.7cm width x 0.1cm depth; 0.275cm^2 area and 0.027cm^3 volume. There is Fat Layer (Subcutaneous Tissue) exposed. There is no tunneling or undermining noted. There is a medium amount of serous drainage noted. The wound margin is flat and intact. There is small (1-33%) pink granulation within the wound bed. There is a large (  67-100%) amount of necrotic tissue within the wound bed including Adherent Slough. Wound #7 status is Open. Original cause of wound was Gradually Appeared. The wound is located on the Left,Medial Lower Leg. The wound measures 1.7cm length x 1.2cm width x 0.1cm depth; 1.602cm^2 area and 0.16cm^3 volume. There is Fat Layer (Subcutaneous Tissue) exposed. There is no tunneling or undermining noted. There is a medium amount of serous drainage noted. The wound margin is flat and intact. There is no granulation within the wound bed. There is a large (67-100%) amount of necrotic tissue within the wound bed including Eschar. Wound #8 status is Open. Original cause of wound was Gradually Appeared. The wound is located on the Right,Lateral Lower Leg. The wound measures 0.8cm length x 1cm width x 0.1cm depth; 0.628cm^2 area and 0.063cm^3 volume. There is Fat Layer (Subcutaneous Tissue) exposed. There is no tunneling or undermining noted. There is a medium amount of serous drainage noted. The wound margin is flat and intact. There is no granulation within the wound bed. There is a large (67-100%) amount of necrotic tissue within the wound bed including Eschar. Assessment Active Problems ICD-10 Type 2 diabetes mellitus with foot ulcer Type 2 diabetes mellitus with diabetic peripheral angiopathy without gangrene Chronic venous hypertension (idiopathic) with ulcer and inflammation of bilateral lower extremity Non-pressure chronic ulcer of other part of left foot with necrosis of muscle Non-pressure chronic ulcer of other part of right lower leg limited to  breakdown of skin 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 Procedures Wound #5 Pre-procedure diagnosis of Wound #5 is a Diabetic Wound/Ulcer of the Lower Extremity located on the Left,Plantar Foot .Severity of Tissue Pre Debridement is: Fat layer exposed. There was a Excisional Skin/Subcutaneous Tissue Debridement with a total area of 23.2 sq cm performed by Maxwell Caul., MD. With the following instrument(s): Curette to remove Viable and Non-Viable tissue/material. Material removed includes Subcutaneous Tissue, Slough, Skin: Dermis, and Skin: Epidermis after achieving pain control using Lidocaine. No specimens were taken. A time out was conducted at 10:20, prior to the start of the procedure. A Minimum amount of bleeding was controlled with Pressure. The procedure was tolerated well with a pain level of 0 throughout and a pain level of 0 following the procedure. Post Debridement Measurements: 4cm length x 5.8cm width x 0.8cm depth; 14.577cm^3 volume. Character of Wound/Ulcer Post Debridement is improved. Severity of Tissue Post Debridement is: Fat layer exposed. Post procedure Diagnosis Wound #5: Same as Pre-Procedure Wound #6 Pre-procedure diagnosis of Wound #6 is a Venous Leg Ulcer located on the Left,Anterior Lower Leg .Severity of Tissue Pre Debridement is: Fat layer exposed. There was a Excisional Skin/Subcutaneous Tissue Debridement with a total area of 0.35 sq cm performed by Maxwell Caul., MD. With the following instrument(s): Curette to remove Viable and Non-Viable tissue/material. Material removed includes Subcutaneous Tissue, Slough, Skin: Dermis, and Skin: Epidermis after achieving pain control using Lidocaine. No specimens were taken. A time out was conducted at 10:24, prior to the start of the procedure. A Minimum amount of bleeding was controlled with Pressure. The procedure was tolerated well with a pain level of 0 throughout and a pain level  of 0 following the procedure. Post Debridement Measurements: 0.5cm length x 0.7cm width x 0.1cm depth; 0.027cm^3 volume. Character of Wound/Ulcer Post Debridement is improved. Severity of Tissue Post Debridement is: Fat layer exposed. Post procedure Diagnosis Wound #6: Same as Pre-Procedure Wound #7 Pre-procedure diagnosis of Wound #7 is a  Venous Leg Ulcer located on the Left,Medial Lower Leg .Severity of Tissue Pre Debridement is: Fat layer exposed. There was a Excisional Skin/Subcutaneous Tissue Debridement with a total area of 2.04 sq cm performed by Maxwell Caul., MD. With the following instrument(s): Curette to remove Viable and Non-Viable tissue/material. Material removed includes Subcutaneous Tissue, Slough, Skin: Dermis, and Skin: Epidermis after achieving pain control using Lidocaine. No specimens were taken. A time out was conducted at 10:23, prior to the start of the procedure. A Minimum amount of bleeding was controlled with Pressure. The procedure was tolerated well with a pain level of 0 throughout and a pain level of 0 following the procedure. Post Debridement Measurements: 1.7cm length x 1.2cm width x 0.1cm depth; 0.16cm^3 volume. Character of Wound/Ulcer Post Debridement is improved. Severity of Tissue Post Debridement is: Fat layer exposed. Post procedure Diagnosis Wound #7: Same as Pre-Procedure Wound #8 Pre-procedure diagnosis of Wound #8 is a Venous Leg Ulcer located on the Right,Lateral Lower Leg .Severity of Tissue Pre Debridement is: Fat layer exposed. There was a Excisional Skin/Subcutaneous Tissue Debridement with a total area of 0.8 sq cm performed by Maxwell Caul., MD. With the following instrument(s): Curette to remove Viable and Non-Viable tissue/material. Material removed includes Subcutaneous Tissue, Slough, Skin: Dermis, and Skin: Epidermis after achieving pain control using Lidocaine. No specimens were taken. A time out was conducted at 10:22, prior to the  start of the procedure. A Minimum amount of bleeding was controlled with Pressure. The procedure was tolerated well with a pain level of 0 throughout and a pain level of 0 following the procedure. Post Debridement Measurements: 0.8cm length x 1cm width x 0.1cm depth; 0.063cm^3 volume. Character of Wound/Ulcer Post Debridement is improved. Severity of Tissue Post Debridement is: Fat layer exposed. Post procedure Diagnosis Wound #8: Same as Pre-Procedure Plan Follow-up Appointments: Return Appointment in 2 weeks. Bathing/ Shower/ Hygiene: May shower with protection but do not get wound dressing(s) wet. - May bed bathe, or use cast protectors in shower. You can shower on days that dressings get changed, just take dressings off first, shower, then dressings should get changed right after. Edema Control - Lymphedema / SCD / Other: Elevate legs to the level of the heart or above for 30 minutes daily and/or when sitting, a frequency of: Avoid standing for long periods of time. WOUND #5: - Foot Wound Laterality: Plantar, Left Cleanser: Soap and Water 3 x Per Week/15 Days Discharge Instructions: May shower and wash wound with dial antibacterial soap and water prior to dressing change. Cleanser: Wound Cleanser (DME) (Generic) 3 x Per Week/15 Days Discharge Instructions: Cleanse the wound with wound cleanser prior to applying a clean dressing using gauze sponges, not tissue or cotton balls. Peri-Wound Care: Sween Lotion (Moisturizing lotion) 3 x Per Week/15 Days Discharge Instructions: Apply moisturizing lotion as directed Prim Dressing: IODOFLEX 0.9% Cadexomer Iodine Pad 4x6 cm (DME) (Generic) 3 x Per Week/15 Days ary Discharge Instructions: Apply to wound bed as instructed Secondary Dressing: Woven Gauze Sponge, Non-Sterile 4x4 in (DME) (Generic) 3 x Per Week/15 Days Discharge Instructions: Apply over primary dressing as directed. Secondary Dressing: ABD Pad, 5x9 (DME) 3 x Per Week/15  Days Discharge Instructions: Apply over primary dressing as directed. Secured With: Coban Self-Adherent Wrap 4x5 (in/yd) (DME) (Generic) 3 x Per Week/15 Days Discharge Instructions: Secure with Coban as directed. Secured With: [employer], 4.5x3.1 (in/yd) (DME) (Generic) 3 x Per Week/15 Days Discharge Instructions: Secure with Kerlix as directed. Secured With: [employer]  Medipore H Soft Cloth Surgical T 4 x 2 (in/yd) (DME) (Generic) 3 x Per Week/15 Days ape Discharge Instructions: Secure dressing with tape as directed. WOUND #6: - Lower Leg Wound Laterality: Left, Anterior Cleanser: Soap and Water 3 x Per Week/15 Days Discharge Instructions: May shower and wash wound with dial antibacterial soap and water prior to dressing change. Cleanser: Wound Cleanser (DME) (Generic) 3 x Per Week/15 Days Discharge Instructions: Cleanse the wound with wound cleanser prior to applying a clean dressing using gauze sponges, not tissue or cotton balls. Peri-Wound Care: Sween Lotion (Moisturizing lotion) 3 x Per Week/15 Days Discharge Instructions: Apply moisturizing lotion as directed Prim Dressing: IODOFLEX 0.9% Cadexomer Iodine Pad 4x6 cm (DME) (Generic) 3 x Per Week/15 Days ary Discharge Instructions: Apply to wound bed as instructed Secondary Dressing: Woven Gauze Sponge, Non-Sterile 4x4 in (DME) (Generic) 3 x Per Week/15 Days Discharge Instructions: Apply over primary dressing as directed. Secondary Dressing: ABD Pad, 5x9 (DME) 3 x Per Week/15 Days Discharge Instructions: Apply over primary dressing as directed. Secured With: Coban Self-Adherent Wrap 4x5 (in/yd) (DME) (Generic) 3 x Per Week/15 Days Discharge Instructions: Secure with Coban as directed. Secured With: [employer], 4.5x3.1 (in/yd) (DME) (Generic) 3 x Per Week/15 Days Discharge Instructions: Secure with Kerlix as directed. Secured With: 5M Medipore H Soft Cloth Surgical T 4 x 2 (in/yd) (DME) (Generic) 3 x Per Week/15  Days ape Discharge Instructions: Secure dressing with tape as directed. WOUND #7: - Lower Leg Wound Laterality: Left, Medial Cleanser: Soap and Water 3 x Per Week/15 Days Discharge Instructions: May shower and wash wound with dial antibacterial soap and water prior to dressing change. Cleanser: Wound Cleanser (DME) (Generic) 3 x Per Week/15 Days Discharge Instructions: Cleanse the wound with wound cleanser prior to applying a clean dressing using gauze sponges, not tissue or cotton balls. Peri-Wound Care: Sween Lotion (Moisturizing lotion) 3 x Per Week/15 Days Discharge Instructions: Apply moisturizing lotion as directed Prim Dressing: IODOFLEX 0.9% Cadexomer Iodine Pad 4x6 cm (DME) (Generic) 3 x Per Week/15 Days ary Discharge Instructions: Apply to wound bed as instructed Secondary Dressing: Woven Gauze Sponge, Non-Sterile 4x4 in (DME) (Generic) 3 x Per Week/15 Days Discharge Instructions: Apply over primary dressing as directed. Secondary Dressing: ABD Pad, 5x9 (DME) 3 x Per Week/15 Days Discharge Instructions: Apply over primary dressing as directed. Secured With: Coban Self-Adherent Wrap 4x5 (in/yd) (DME) (Generic) 3 x Per Week/15 Days Discharge Instructions: Secure with Coban as directed. Secured With: [employer], 4.5x3.1 (in/yd) (DME) (Generic) 3 x Per Week/15 Days Discharge Instructions: Secure with Kerlix as directed. Secured With: 5M Medipore H Soft Cloth Surgical T 4 x 2 (in/yd) (DME) (Generic) 3 x Per Week/15 Days ape Discharge Instructions: Secure dressing with tape as directed. WOUND #8: - Lower Leg Wound Laterality: Right, Lateral Cleanser: Soap and Water 3 x Per Week/15 Days Discharge Instructions: May shower and wash wound with dial antibacterial soap and water prior to dressing change. Cleanser: Wound Cleanser (DME) (Generic) 3 x Per Week/15 Days Discharge Instructions: Cleanse the wound with wound cleanser prior to applying a clean dressing using gauze sponges,  not tissue or cotton balls. Peri-Wound Care: Sween Lotion (Moisturizing lotion) 3 x Per Week/15 Days Discharge Instructions: Apply moisturizing lotion as directed Prim Dressing: IODOFLEX 0.9% Cadexomer Iodine Pad 4x6 cm (DME) (Generic) 3 x Per Week/15 Days ary Discharge Instructions: Apply to wound bed as instructed Secondary Dressing: Woven Gauze Sponge, Non-Sterile 4x4 in (DME) (Generic) 3 x Per Week/15 Days Discharge  Instructions: Apply over primary dressing as directed. Secondary Dressing: ABD Pad, 5x9 (DME) 3 x Per Week/15 Days Discharge Instructions: Apply over primary dressing as directed. Secured With: Coban Self-Adherent Wrap 4x5 (in/yd) (DME) (Generic) 3 x Per Week/15 Days Discharge Instructions: Secure with Coban as directed. Secured With: [employer], 4.5x3.1 (in/yd) (DME) (Generic) 3 x Per Week/15 Days Discharge Instructions: Secure with Kerlix as directed. Secured With: 57M Medipore H Soft Cloth Surgical T 4 x 2 (in/yd) (DME) (Generic) 3 x Per Week/15 Days ape Discharge Instructions: Secure dressing with tape as directed. 1. Multiple different etiologies here including diabetic foot wounds with neuropathy and PAD as well as chronic venous insufficiency stasis dermatitis and probably some degree of lymphedema. She is nonambulatory but apparently pushes herself around in her wheelchair using her feet this is probably not helpful. All of these wound areas are going to require ongoing debridement. I am not exactly sure what the nursing home will be able to provide my suggestion was Iodoflex, Medihoney would be a good alternative. 2. I suppose we could use Santyl and change the kerlix Coban daily. 3. She has ongoing PAD but bled profusely from the bottom of the plantar foot this is probably a good sign. 4. I did not see any evidence of infection although she apparently is finishing up antibiotics. 5. She refused an amputation of the left leg in hospital and for now I do not  think an amputation is necessary. She is on IV antibiotics vancomycin and Rocephin for underlying osteomyelitis. I will see if she had inflammatory markers. I am not sure if she has a vascular follow-up planned. I spent 45 minutes in review of this patient's past medical history, face-to-face evaluation and preparation of this record Electronic Signature(s) Signed: 07/23/2020 4:57:44 PM By: Baltazar Najjar MD Entered By: Baltazar Najjar on 07/23/2020 11:01:45 -------------------------------------------------------------------------------- HxROS Details Patient Name: Date of Service: Tara Flores. 07/23/2020 9:00 A M Medical Record Number: [id number] Patient Account Number: [id number] Date of Birth/Sex: Treating RN: 1935-11-12 (85 y.o. Female) Zenaida Deed Primary Care Provider: [name] Other Clinician: Referring Provider: Treating Provider/Extender: Quenton Fetter in Treatment: 0 Information Obtained From Patient Constitutional Symptoms (General Health) Complaints and Symptoms: Negative for: Fatigue; Fever; Chills; Marked Weight Change Medical History: Past Medical History Notes: obesity Ear/Nose/Mouth/Throat Complaints and Symptoms: Negative for: Chronic sinus problems or rhinitis Medical History: Positive for: Chronic sinus problems/congestion - seasonal allergies Respiratory Complaints and Symptoms: Positive for: Shortness of Breath Negative for: Chronic or frequent coughs Medical History: Positive for: Asthma; Chronic Obstructive Pulmonary Disease (COPD) Negative for: Aspiration; Pneumothorax; Sleep Apnea; Tuberculosis Past Medical History Notes: emphysema Gastrointestinal Complaints and Symptoms: Negative for: Frequent diarrhea; Nausea; Vomiting Medical History: Positive for: Colitis Endocrine Complaints and Symptoms: Negative for: Heat/cold intolerance Medical History: Positive for: Type II Diabetes Negative for: Type I  Diabetes Time with diabetes: 30 + years Treated with: Insulin Blood sugar tested every day: Yes Tested : 4 times per day Genitourinary Complaints and Symptoms: Negative for: Frequent urination Medical History: Negative for: End Stage Renal Disease Integumentary (Skin) Complaints and Symptoms: Positive for: Wounds - left foot, bil lower legs Medical History: Negative for: History of Burn Musculoskeletal Complaints and Symptoms: Positive for: Muscle Weakness Medical History: Positive for: Osteoarthritis; Osteomyelitis - left foot Negative for: Gout; Rheumatoid Arthritis Neurologic Complaints and Symptoms: Positive for: Numbness/parasthesias Medical History: Positive for: Neuropathy Past Medical History Notes: TIA Psychiatric Complaints and Symptoms: Positive for: Claustrophobia - mild Negative for: Suicidal Medical  History: Positive for: Confinement Anxiety Negative for: Anorexia/bulimia Eyes Medical History: Positive for: Cataracts - surgery Negative for: Glaucoma; Optic Neuritis Past Medical History Notes: Macular degeneration, retinopathy, legally blind Hematologic/Lymphatic Medical History: Positive for: Anemia Negative for: Lymphedema Cardiovascular Medical History: Positive for: Arrhythmia - afib; Congestive Heart Failure; Coronary Artery Disease; Hypertension; Peripheral Arterial Disease Past Medical History Notes: hyperlipidemia Immunological Oncologic Medical History: Negative for: Received Chemotherapy; Received Radiation HBO Extended History Items Ear/Nose/Mouth/Throat: Eyes: Chronic sinus Cataracts problems/congestion Immunizations Pneumococcal Vaccine: Received Pneumococcal Vaccination: Yes Implantable Devices Yes Hospitalization / Surgery History Type of Hospitalization/Surgery left transmet amputation appendectomy cardiac cath right intertrochanteri nailing eye surgery right hip replacement Family and Social History Cancer: Yes -  Mother; Diabetes: Yes - Mother,Siblings; Hereditary Spherocytosis: No; Hypertension: Yes - Mother; Thyroid Problems: Yes - Mother; Tuberculosis: No; Current every day smoker; Marital Status - Widowed; Alcohol Use: Rarely; Drug Use: No History; Caffeine Use: Daily; Financial Concerns: No; Food, Clothing or Shelter Needs: No; Support System Lacking: No; Transportation Concerns: No Psychologist, prison and probation serviceslectronic Signature(s) Signed: 07/23/2020 4:51:35 PM By: Zenaida DeedBoehlein, Linda RN, BSN Signed: 07/23/2020 4:57:44 PM By: Baltazar Najjarobson, Tyquisha Sharps MD Entered By: Zenaida DeedBoehlein, Linda on 07/23/2020 09:21:48 -------------------------------------------------------------------------------- SuperBill Details Patient Name: Date of Service: Tara PoeULRICH, Tiffinie L. 07/23/2020 Medical Record Number: [id number] Patient Account Number: [id number] Date of Birth/Sex: Treating RN: 04/20/1936 (85 y.o. Female) Fonnie MuBreedlove, [name] Primary Care Provider: Anson FretAriza, Fernando Other Clinician: Referring Provider: Treating Provider/Extender: Quenton Fetterobson, Ludie Hudon Ariza, Fernando Weeks in Treatment: 0 Diagnosis Coding ICD-10 Codes Code Description [id number] Type 2 diabetes mellitus with foot ulcer E11.51 Type 2 diabetes mellitus with diabetic peripheral angiopathy without gangrene I87.333 Chronic venous hypertension (idiopathic) with ulcer and inflammation of bilateral lower extremity L97.523 Non-pressure chronic ulcer of other part of left foot with necrosis of muscle L97.811 Non-pressure chronic ulcer of other part of right lower leg limited to breakdown of skin L97.121 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 Facility Procedures CPT4 Code: [id number] Description: 99214 - WOUND CARE VISIT-LEV 4 EST PT Modifier: Quantity: 1 CPT4 Code: [id number] Description: 11042 - DEB SUBQ TISSUE 20 SQ CM/< ICD-10 Diagnosis Description L97.523 Non-pressure chronic ulcer of other part of left foot with necrosis of muscle L97.811 Non-pressure chronic ulcer of other  part of right lower leg limited to breakdown  L97.121 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 Modifier: of skin Quantity: 1 CPT4 Code: [id number] IC L L L Description: 11045 - DEB SUBQ TISS EA ADDL 20CM D-10 Diagnosis Description 97.523 Non-pressure chronic ulcer of other part of left foot with necrosis of muscle 97.811 Non-pressure chronic ulcer of other part of right lower leg limited to breakdown of ski  97.121 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 Modifier: n Quantity: 1 Physician Procedures : CPT4 Code Description Modifier [id number] - WC PHYS LEVEL 4 - NEW PT 25 ICD-10 Diagnosis Description L97.523 Non-pressure chronic ulcer of other part of left foot with necrosis of muscle E11.621 Type 2 diabetes mellitus with foot ulcer I87.333  Chronic venous hypertension (idiopathic) with ulcer and inflammation of bilateral lower extremity Quantity: 1 : [id number] 11042 - WC PHYS SUBQ TISS 20 SQ CM ICD-10 Diagnosis Description L97.523 Non-pressure chronic ulcer of other part of left foot with necrosis of muscle L97.811 Non-pressure chronic ulcer of other part of right lower leg limited to breakdown of  skin L97.121 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

## 2020-08-06 ENCOUNTER — Encounter (HOSPITAL_BASED_OUTPATIENT_CLINIC_OR_DEPARTMENT_OTHER): Payer: Medicare Other | Admitting: Internal Medicine

## 2020-08-06 ENCOUNTER — Other Ambulatory Visit: Payer: Self-pay

## 2020-08-06 DIAGNOSIS — E11621 Type 2 diabetes mellitus with foot ulcer: Secondary | ICD-10-CM | POA: Diagnosis not present

## 2020-08-09 NOTE — Progress Notes (Signed)
Tara Flores, Evadna L. ([id number]) Visit Report for 08/06/2020 Debridement Details Patient Name: Date of Service: Tara Flores, Justine L. 08/06/2020 11:00 A M Medical Record Number: [id number] Patient Account Number: [id number] Date of Birth/Sex: Treating RN: 03/08/1936 (85 y.o. Ardis RowanF) Breedlove, [name] Primary Care Provider: Anson FretAriza, Fernando Other Clinician: Referring Provider: Treating Provider/Extender: Quenton Fetterobson, Michael Ariza, Fernando Weeks in Treatment: 2 Debridement Performed for Assessment: Wound #5 Left,Plantar Foot Performed By: Physician Maxwell Caulobson, Michael G., MD Debridement Type: Debridement Severity of Tissue Pre Debridement: Fat layer exposed Level of Consciousness (Pre-procedure): Awake and Alert Pre-procedure Verification/Time Out Yes - 12:17 Taken: Start Time: 12:17 Pain Control: Lidocaine T Area Debrided (L x W): otal 4.3 (cm) x 5.2 (cm) = 22.36 (cm) Tissue and other material debrided: Viable, Non-Viable, Callus, Slough, Subcutaneous, Skin: Dermis , Skin: Epidermis, Slough Level: Skin/Subcutaneous Tissue Debridement Description: Excisional Instrument: Curette Bleeding: Minimum Hemostasis Achieved: Pressure End Time: 12:19 Procedural Pain: 0 Post Procedural Pain: 0 Response to Treatment: Procedure was tolerated well Level of Consciousness (Post- Awake and Alert procedure): Post Debridement Measurements of Total Wound Length: (cm) 4.3 Width: (cm) 5.2 Depth: (cm) 1.1 Volume: (cm) 19.318 Character of Wound/Ulcer Post Debridement: Improved Severity of Tissue Post Debridement: Fat layer exposed Post Procedure Diagnosis Same as Pre-procedure Electronic Signature(s) Signed: 08/06/2020 5:22:09 PM By: Baltazar Najjarobson, Michael MD Signed: 08/09/2020 5:09:47 PM By: Fonnie MuBreedlove, [name] RN Entered By: Baltazar Najjarobson, Michael on 08/06/2020 13:00:12 -------------------------------------------------------------------------------- HPI Details Patient Name: Date of Service: Tara Flores, Tara L. 08/06/2020 11:00 A  M Medical Record Number: [id number] Patient Account Number: [id number] Date of Birth/Sex: Treating RN: 05/09/1936 (85 y.o. Ardis RowanF) Breedlove, [name] Primary Care Provider: Anson FretAriza, Fernando Other Clinician: Referring Provider: Treating Provider/Extender: Quenton Fetterobson, Michael Ariza, Fernando Weeks in Treatment: 2 History of Present Illness Location: both legs Quality: Patient reports No Pain. Severity: Patient states wound(s) are getting better. Modifying Factors: has had keflex HPI Description: dm with neuropathy and retinopathy. stasis changes surround the wounds. edema is minimal. feet are very dry and pulsesless. abi bilat around 0.7 READMISSION 07/23/2020 This is an 85 year old woman [facility] she is apparently been a resident for 3 years. She was here and saw Dr. Wiliam KeArkin I believe in 2016 with wounds on her bilateral legs. At some point she had a left transmetatarsal amputation but I do not really have any information on this. Furthermore I am not really certain what they have been dressing her wounds with. She has a large area on the plantar left foot and smaller areas on the bilateral anterior lower legs. She was hospitalized from 06/25/2020 through 07/03/2020 discovered to have a left foot ulcer with underlying osteomyelitis of the left cuboid. She was either seen or consulted with ID and she is on IV Rocephin and vancomycin with the Rocephin finishing on February 14 and the vancomycin on the 21st. I am not really sure what follow-up she has. She was also revascularized during this hospitalization with an angiogram showing left SFA stenosis. She underwent stenting of the left SFA stenting of the PTA and left peroneal arteries. She refused an amputation on the left. I am not sure what they are putting on any of these wounds although she does have a kerlix Coban wrap. She is a poorly controlled type II diabetic she also is a continued smoker Past medical history  includes atrial fibrillation, chronic venous insufficiency, type 2 diabetes with most recent hemoglobin A1c of 9.6, chronic diastolic heart failure, peripheral vascular disease, left foot transmet apparently in April 2018. Her post  revascularization ABIs were 0.74 on the right and 0.60 on the left. Waveforms were monophasic this was on 06/25/2020. T oday we obtained 0.57 on the right and 0.6 5 on the left. MRI on 1/10 showed osteomyelitis of the plantar aspect of the cuboid. No additional sites of acute osteomyelitis noted prior forefoot amputation and no abscess I have reviewed her angiogram which resulted in a laser atherectomy of the left SFA and left peroneal artery. Angioplasty of the left SFA and angioplasty of the left peroneal I do not see mentions of stents however. 08/06/2018 2 seconds; second visit in the clinic for this woman with a substantial wound on the left plantar foot just below her left TMA. She was hospitalized for this and underwent a substantial revascularization. MRI suggested osteomyelitis. By my notes from last time the antibiotic should have finished although apparently she is still getting antibiotics and still has her PICC line. I cannot see looking through [place] link that she foot has a follow-up with vascular or ID. We have been using Iodoflex on this area to hopefully get to a better surface she arrives with a wound looking about the same. She has undermining laterally no exposed bone Electronic Signature(s) Signed: 08/06/2020 5:22:09 PM By: Baltazar Najjar MD Entered By: Baltazar Najjar on 08/06/2020 13:01:50 -------------------------------------------------------------------------------- Physical Exam Details Patient Name: Date of Service: Tara Poe 08/06/2020 11:00 A M Medical Record Number: [id number] Patient Account Number: [id number] Date of Birth/Sex: Treating RN: [birth year] (85 y.o. [name], [name] Primary Care Provider: [name] Other  Clinician: Referring Provider: Treating Provider/Extender: Quenton Fetter in Treatment: 2 Constitutional Patient is hypotensive.. Pulse regular and within target range for patient.Marland Kitchen Respirations regular, non-labored and within target range.. Temperature is normal and within the target range for the patient.Marland Kitchen Appears in no distress. Notes Wound exam Substantial wound on the left plantar foot. Again not a very viable surface. Extensive debridement with another 5 curette with really copious amounts of bleeding. Hemostasis with silver nitrate and a pressure dressing. He also has wounds on the left anterior left medial and right lower leg which look like venous insufficiency wounds. She She has chronic venous insufficiency and stasis dermatitis Electronic Signature(s) Signed: 08/06/2020 5:22:09 PM By: Baltazar Najjar MD Entered By: Baltazar Najjar on 08/06/2020 13:03:05 -------------------------------------------------------------------------------- Physician Orders Details Patient Name: Date of Service: Tara Poe 08/06/2020 11:00 A M Medical Record Number: [id number] Patient Account Number: [id number] Date of Birth/Sex: Treating RN: [birth year] (85 y.o. [name], [name] Primary Care Provider: [name] Other Clinician: Referring Provider: Treating Provider/Extender: Quenton Fetter in Treatment: 2 Verbal / Phone Orders: No Diagnosis Coding Follow-up Appointments Return Appointment in 2 weeks. Bathing/ Shower/ Hygiene May shower with protection but do not get wound dressing(s) wet. - May bed bathe, or use cast protectors in shower. You can shower on days that dressings get changed, just take dressings off first, shower, then dressings should get changed right after. Edema Control - Lymphedema / SCD / Other Elevate legs to the level of the heart or above for 30 minutes daily and/or when sitting, a frequency of: Avoid standing  for long periods of time. Wound Treatment Wound #10 - Lower Leg Wound Laterality: Right, Lateral, Proximal Cleanser: Soap and Water 3 x Per Week/15 Days Discharge Instructions: May shower and wash wound with dial antibacterial soap and water prior to dressing change. Cleanser: Wound Cleanser (Generic) 3 x Per Week/15 Days Discharge Instructions: Cleanse the wound with wound cleanser  prior to applying a clean dressing using gauze sponges, not tissue or cotton balls. Peri-Wound Care: Triamcinolone 15 (g) 3 x Per Week/15 Days Discharge Instructions: Use triamcinolone 15 (g) as directed Peri-Wound Care: Sween Lotion (Moisturizing lotion) 3 x Per Week/15 Days Discharge Instructions: Apply moisturizing lotion as directed Prim Dressing: IODOFLEX 0.9% Cadexomer Iodine Pad 4x6 cm (Generic) 3 x Per Week/15 Days ary Discharge Instructions: Apply to wound bed as instructed Secondary Dressing: Woven Gauze Sponge, Non-Sterile 4x4 in (Generic) 3 x Per Week/15 Days Discharge Instructions: Apply over primary dressing as directed. Secondary Dressing: ABD Pad, 5x9 3 x Per Week/15 Days Discharge Instructions: Apply over primary dressing as directed. Compression Wrap: Kerlix Roll 4.5x3.1 (in/yd) (Generic) 3 x Per Week/15 Days Discharge Instructions: Apply Kerlix and Coban compression as directed. Compression Wrap: Coban Self-Adherent Wrap 4x5 (in/yd) (Generic) 3 x Per Week/15 Days Discharge Instructions: Apply over Kerlix as directed. Apply very light coban. Wound #5 - Foot Wound Laterality: Plantar, Left Cleanser: Soap and Water 3 x Per Week/15 Days Discharge Instructions: May shower and wash wound with dial antibacterial soap and water prior to dressing change. Cleanser: Wound Cleanser (Generic) 3 x Per Week/15 Days Discharge Instructions: Cleanse the wound with wound cleanser prior to applying a clean dressing using gauze sponges, not tissue or cotton balls. Peri-Wound Care: Triamcinolone 15 (g) 3 x Per  Week/15 Days Discharge Instructions: Use triamcinolone 15 (g) as directed Peri-Wound Care: Sween Lotion (Moisturizing lotion) 3 x Per Week/15 Days Discharge Instructions: Apply moisturizing lotion as directed Prim Dressing: IODOFLEX 0.9% Cadexomer Iodine Pad 4x6 cm (Generic) 3 x Per Week/15 Days ary Discharge Instructions: Apply to wound bed as instructed Secondary Dressing: Woven Gauze Sponge, Non-Sterile 4x4 in (Generic) 3 x Per Week/15 Days Discharge Instructions: Apply over primary dressing as directed. Secondary Dressing: ABD Pad, 5x9 3 x Per Week/15 Days Discharge Instructions: Apply over primary dressing as directed. Compression Wrap: Kerlix Roll 4.5x3.1 (in/yd) (Generic) 3 x Per Week/15 Days Discharge Instructions: Apply Kerlix and Coban compression as directed. Compression Wrap: Coban Self-Adherent Wrap 4x5 (in/yd) (Generic) 3 x Per Week/15 Days Discharge Instructions: Apply over Kerlix as directed. Apply very light coban. Wound #7 - Lower Leg Wound Laterality: Left, Medial Cleanser: Soap and Water 3 x Per Week/15 Days Discharge Instructions: May shower and wash wound with dial antibacterial soap and water prior to dressing change. Cleanser: Wound Cleanser (Generic) 3 x Per Week/15 Days Discharge Instructions: Cleanse the wound with wound cleanser prior to applying a clean dressing using gauze sponges, not tissue or cotton balls. Peri-Wound Care: Triamcinolone 15 (g) 3 x Per Week/15 Days Discharge Instructions: Use triamcinolone 15 (g) as directed Peri-Wound Care: Sween Lotion (Moisturizing lotion) 3 x Per Week/15 Days Discharge Instructions: Apply moisturizing lotion as directed Prim Dressing: IODOFLEX 0.9% Cadexomer Iodine Pad 4x6 cm (Generic) 3 x Per Week/15 Days ary Discharge Instructions: Apply to wound bed as instructed Secondary Dressing: Woven Gauze Sponge, Non-Sterile 4x4 in (Generic) 3 x Per Week/15 Days Discharge Instructions: Apply over primary dressing as  directed. Secondary Dressing: ABD Pad, 5x9 3 x Per Week/15 Days Discharge Instructions: Apply over primary dressing as directed. Compression Wrap: Kerlix Roll 4.5x3.1 (in/yd) (Generic) 3 x Per Week/15 Days Discharge Instructions: Apply Kerlix and Coban compression as directed. Compression Wrap: Coban Self-Adherent Wrap 4x5 (in/yd) (Generic) 3 x Per Week/15 Days Discharge Instructions: Apply over Kerlix as directed. Apply very light coban. Wound #8 - Lower Leg Wound Laterality: Right, Lateral Cleanser: Soap and Water 3 x Per Week/15 Days  Discharge Instructions: May shower and wash wound with dial antibacterial soap and water prior to dressing change. Cleanser: Wound Cleanser (Generic) 3 x Per Week/15 Days Discharge Instructions: Cleanse the wound with wound cleanser prior to applying a clean dressing using gauze sponges, not tissue or cotton balls. Peri-Wound Care: Triamcinolone 15 (g) 3 x Per Week/15 Days Discharge Instructions: Use triamcinolone 15 (g) as directed Peri-Wound Care: Sween Lotion (Moisturizing lotion) 3 x Per Week/15 Days Discharge Instructions: Apply moisturizing lotion as directed Prim Dressing: IODOFLEX 0.9% Cadexomer Iodine Pad 4x6 cm (Generic) 3 x Per Week/15 Days ary Discharge Instructions: Apply to wound bed as instructed Secondary Dressing: Woven Gauze Sponge, Non-Sterile 4x4 in (Generic) 3 x Per Week/15 Days Discharge Instructions: Apply over primary dressing as directed. Secondary Dressing: ABD Pad, 5x9 3 x Per Week/15 Days Discharge Instructions: Apply over primary dressing as directed. Compression Wrap: Kerlix Roll 4.5x3.1 (in/yd) (Generic) 3 x Per Week/15 Days Discharge Instructions: Apply Kerlix and Coban compression as directed. Compression Wrap: Coban Self-Adherent Wrap 4x5 (in/yd) (Generic) 3 x Per Week/15 Days Discharge Instructions: Apply over Kerlix as directed. Apply very light coban. Wound #9 - Lower Leg Wound Laterality: Left, Anterior,  Proximal Cleanser: Soap and Water 3 x Per Week/15 Days Discharge Instructions: May shower and wash wound with dial antibacterial soap and water prior to dressing change. Cleanser: Wound Cleanser (Generic) 3 x Per Week/15 Days Discharge Instructions: Cleanse the wound with wound cleanser prior to applying a clean dressing using gauze sponges, not tissue or cotton balls. Peri-Wound Care: Triamcinolone 15 (g) 3 x Per Week/15 Days Discharge Instructions: Use triamcinolone 15 (g) as directed Peri-Wound Care: Sween Lotion (Moisturizing lotion) 3 x Per Week/15 Days Discharge Instructions: Apply moisturizing lotion as directed Prim Dressing: IODOFLEX 0.9% Cadexomer Iodine Pad 4x6 cm (Generic) 3 x Per Week/15 Days ary Discharge Instructions: Apply to wound bed as instructed Secondary Dressing: Woven Gauze Sponge, Non-Sterile 4x4 in (Generic) 3 x Per Week/15 Days Discharge Instructions: Apply over primary dressing as directed. Secondary Dressing: ABD Pad, 5x9 3 x Per Week/15 Days Discharge Instructions: Apply over primary dressing as directed. Compression Wrap: Kerlix Roll 4.5x3.1 (in/yd) (Generic) 3 x Per Week/15 Days Discharge Instructions: Apply Kerlix and Coban compression as directed. Compression Wrap: Coban Self-Adherent Wrap 4x5 (in/yd) (Generic) 3 x Per Week/15 Days Discharge Instructions: Apply over Kerlix as directed. Apply very light coban. Electronic Signature(s) Signed: 08/06/2020 5:22:09 PM By: Baltazar Najjar MD Signed: 08/09/2020 5:09:47 PM By: Fonnie Mu RN Entered By: Fonnie Mu on 08/06/2020 12:27:09 -------------------------------------------------------------------------------- Problem List Details Patient Name: Date of Service: Tara Poe 08/06/2020 11:00 A M Medical Record Number: [id number] Patient Account Number: [id number] Date of Birth/Sex: Treating RN: 07/28/1935 (85 y.o. [name], [name] Primary Care Provider: [name] Other  Clinician: Referring Provider: Treating Provider/Extender: Quenton Fetter in Treatment: 2 Active Problems ICD-10 Encounter Code Description Active Date MDM Diagnosis E11.621 Type 2 diabetes mellitus with foot ulcer 07/23/2020 No Yes E11.51 Type 2 diabetes mellitus with diabetic peripheral angiopathy without gangrene 07/23/2020 No Yes I87.333 Chronic venous hypertension (idiopathic) with ulcer and inflammation of 07/23/2020 No Yes bilateral lower extremity L97.523 Non-pressure chronic ulcer of other part of left foot with necrosis of muscle 07/23/2020 No Yes L97.811 Non-pressure chronic ulcer of other part of right lower leg limited to breakdown 07/23/2020 No Yes of skin L97.121 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 07/23/2020 No Yes Inactive Problems Resolved Problems Electronic Signature(s) Signed: 08/06/2020 5:22:09 PM By: Leanord Hawking,  Casimiro Needle MD Entered By: Baltazar Najjar on 08/06/2020 12:59:45 -------------------------------------------------------------------------------- Progress Note Details Patient Name: Date of Service: Tara Flores, Tara Flores 08/06/2020 11:00 A M Medical Record Number: [id number] Patient Account Number: [id number] Date of Birth/Sex: Treating RN: August 21, 1935 (85 y.o. [name], [name] Primary Care Provider: [name] Other Clinician: Referring Provider: Treating Provider/Extender: Quenton Fetter in Treatment: 2 Subjective History of Present Illness (HPI) The following HPI elements were documented for the patient's wound: Location: both legs Quality: Patient reports No Pain. Severity: Patient states wound(s) are getting better. Modifying Factors: has had keflex dm with neuropathy and retinopathy. stasis changes surround the wounds. edema is minimal. feet are very dry and pulsesless. abi bilat around 0.7 READMISSION 07/23/2020 This is an 85 year old woman who comes from [facility] skilled  facility where she is apparently been a resident for 3 years. She was here and saw Dr. Wiliam Ke I believe in 2016 with wounds on her bilateral legs. At some point she had a left transmetatarsal amputation but I do not really have any information on this. Furthermore I am not really certain what they have been dressing her wounds with. She has a large area on the plantar left foot and smaller areas on the bilateral anterior lower legs. She was hospitalized from 06/25/2020 through 07/03/2020 discovered to have a left foot ulcer with underlying osteomyelitis of the left cuboid. She was either seen or consulted with ID and she is on IV Rocephin and vancomycin with the Rocephin finishing on February 14 and the vancomycin on the 21st. I am not really sure what follow-up she has. She was also revascularized during this hospitalization with an angiogram showing left SFA stenosis. She underwent stenting of the left SFA stenting of the PTA and left peroneal arteries. She refused an amputation on the left. I am not sure what they are putting on any of these wounds although she does have a kerlix Coban wrap. She is a poorly controlled type II diabetic she also is a continued smoker Past medical history includes atrial fibrillation, chronic venous insufficiency, type 2 diabetes with most recent hemoglobin A1c of 9.6, chronic diastolic heart failure, peripheral vascular disease, left foot transmet apparently in April 2018. Her post revascularization ABIs were 0.74 on the right and 0.60 on the left. Waveforms were monophasic this was on 06/25/2020. T oday we obtained 0.57 on the right and 0.6 5 on the left. MRI on 1/10 showed osteomyelitis of the plantar aspect of the cuboid. No additional sites of acute osteomyelitis noted prior forefoot amputation and no abscess I have reviewed her angiogram which resulted in a laser atherectomy of the left SFA and left peroneal artery. Angioplasty of the left SFA and angioplasty of the  left peroneal I do not see mentions of stents however. 08/06/2018 2 seconds; second visit in the clinic for this woman with a substantial wound on the left plantar foot just below her left TMA. She was hospitalized for this and underwent a substantial revascularization. MRI suggested osteomyelitis. By my notes from last time the antibiotic should have finished although apparently she is still getting antibiotics and still has her PICC line. I cannot see looking through [place] link that she foot has a follow-up with vascular or ID. We have been using Iodoflex on this area to hopefully get to a better surface she arrives with a wound looking about the same. She has undermining laterally no exposed bone Objective Constitutional Patient is hypotensive.. Pulse regular and within target range  for patient.Marland Kitchen Respirations regular, non-labored and within target range.. Temperature is normal and within the target range for the patient.Marland Kitchen Appears in no distress. Vitals Time Taken: 11:35 AM, Height: 61 in, Source: Stated, Weight: 157 lbs, Source: Stated, BMI: 29.7, Temperature: 98 F, Pulse: 68 bpm, Respiratory Rate: 18 breaths/min, Blood Pressure: 95/56 mmHg. General Notes: Wound exam ooSubstantial wound on the left plantar foot. Again not a very viable surface. Extensive debridement with another 5 curette with really copious amounts of bleeding. Hemostasis with silver nitrate and a pressure dressing. ooHe also has wounds on the left anterior left medial and right lower leg which look like venous insufficiency wounds. She ooShe has chronic venous insufficiency and stasis dermatitis Integumentary (Hair, Skin) Wound #10 status is Open. Original cause of wound was Gradually Appeared. The wound is located on the Right,Proximal,Lateral Lower Leg. The wound measures 0.9cm length x 0.9cm width x 0.1cm depth; 0.636cm^2 area and 0.064cm^3 volume. There is Fat Layer (Subcutaneous Tissue) exposed. There is  no tunneling or undermining noted. There is a small amount of serous drainage noted. The wound margin is flat and intact. There is small (1-33%) pink granulation within the wound bed. There is a large (67-100%) amount of necrotic tissue within the wound bed including Adherent Slough. Wound #5 status is Open. Original cause of wound was Gradually Appeared. The wound is located on the Left,Plantar Foot. The wound measures 4.3cm length x 5.2cm width x 1.1cm depth; 17.562cm^2 area and 19.318cm^3 volume. There is Fat Layer (Subcutaneous Tissue) exposed. There is no tunneling or undermining noted. There is a large amount of serosanguineous drainage noted. The wound margin is epibole. There is small (1-33%) pink granulation within the wound bed. There is a large (67-100%) amount of necrotic tissue within the wound bed including Adherent Slough. Wound #6 status is Healed - Epithelialized. Original cause of wound was Gradually Appeared. The wound is located on the Left,Anterior Lower Leg. The wound measures 0cm length x 0cm width x 0cm depth; 0cm^2 area and 0cm^3 volume. There is no tunneling or undermining noted. There is a none present amount of drainage noted. The wound margin is flat and intact. There is no granulation within the wound bed. There is no necrotic tissue within the wound bed. Wound #7 status is Open. Original cause of wound was Gradually Appeared. The wound is located on the Left,Medial Lower Leg. The wound measures 2cm length x 1cm width x 0.1cm depth; 1.571cm^2 area and 0.157cm^3 volume. There is Fat Layer (Subcutaneous Tissue) exposed. There is no tunneling or undermining noted. There is a medium amount of serous drainage noted. The wound margin is flat and intact. There is small (1-33%) pink granulation within the wound bed. There is a large (67-100%) amount of necrotic tissue within the wound bed including Adherent Slough. Wound #8 status is Open. Original cause of wound was Gradually  Appeared. The wound is located on the Right,Lateral Lower Leg. The wound measures 0.3cm length x 0.3cm width x 0.1cm depth; 0.071cm^2 area and 0.007cm^3 volume. The wound is limited to skin breakdown. There is no tunneling or undermining noted. There is a small amount of serous drainage noted. The wound margin is flat and intact. There is large (67-100%) pink granulation within the wound bed. There is no necrotic tissue within the wound bed. Wound #9 status is Open. Original cause of wound was Gradually Appeared. The wound is located on the Left,Proximal,Anterior Lower Leg. The wound measures 0.3cm length x 0.3cm width x 0.1cm depth; 0.071cm^2  area and 0.007cm^3 volume. There is Fat Layer (Subcutaneous Tissue) exposed. There is no tunneling or undermining noted. There is a medium amount of serous drainage noted. The wound margin is flat and intact. There is small (1-33%) pink granulation within the wound bed. There is a large (67-100%) amount of necrotic tissue within the wound bed including Adherent Slough. 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 There was a Excisional Skin/Subcutaneous Tissue Debridement with a total area of 22.36 sq cm performed by Maxwell Caul., MD. With the following instrument(s): Curette to remove Viable and Non-Viable  tissue/material. Material removed includes Callus, Subcutaneous Tissue, Slough, Skin: Dermis, and Skin: Epidermis after achieving pain control using Lidocaine. No specimens were taken. A time out was conducted at 12:17, prior to the start of the procedure. A Minimum amount of bleeding was controlled with Pressure. The procedure was tolerated well with a pain level of 0 throughout and a pain level of 0 following the procedure. Post Debridement Measurements: 4.3cm length x 5.2cm width x 1.1cm depth; 19.318cm^3 volume. Character of Wound/Ulcer Post Debridement is improved. Severity of Tissue Post Debridement is: Fat layer exposed. Post procedure Diagnosis Wound #5: Same as Pre-Procedure Plan Follow-up Appointments: Return Appointment in 2 weeks. Bathing/ Shower/ Hygiene: May shower with protection but do not get wound dressing(s) wet. - May bed bathe, or use cast protectors in shower. You can shower on days that dressings get changed, just take dressings off first, shower, then dressings should get changed right after. Edema Control - Lymphedema / SCD / Other: Elevate legs to the level of the heart or above for 30 minutes daily and/or when sitting, a frequency of: Avoid standing for long periods of time. WOUND #10: - Lower Leg Wound Laterality: Right, Lateral, Proximal Cleanser: Soap and Water 3 x Per Week/15 Days Discharge Instructions: May shower and wash wound with dial antibacterial soap and water prior to dressing change. Cleanser: Wound Cleanser (Generic) 3 x Per Week/15 Days Discharge Instructions: Cleanse the wound with wound cleanser prior to applying a clean dressing using gauze sponges, not tissue or cotton balls. Peri-Wound Care: Triamcinolone 15 (g) 3 x Per Week/15 Days Discharge Instructions: Use triamcinolone 15 (g) as directed Peri-Wound Care: Sween Lotion (Moisturizing lotion) 3 x Per Week/15 Days Discharge Instructions: Apply moisturizing lotion as directed Prim Dressing:  IODOFLEX 0.9% Cadexomer Iodine Pad 4x6 cm (Generic) 3 x Per Week/15 Days ary Discharge Instructions: Apply to wound bed as instructed Secondary Dressing: Woven Gauze Sponge, Non-Sterile 4x4 in (Generic) 3 x Per Week/15 Days Discharge Instructions: Apply over primary dressing as directed. Secondary Dressing: ABD Pad, 5x9 3 x Per Week/15 Days Discharge Instructions: Apply over primary dressing as directed. Com pression Wrap: Kerlix Roll 4.5x3.1 (in/yd) (Generic) 3 x Per Week/15 Days Discharge Instructions: Apply Kerlix and Coban compression as directed. Com pression Wrap: Coban Self-Adherent Wrap 4x5 (in/yd) (Generic) 3 x Per Week/15 Days Discharge Instructions: Apply over Kerlix as directed. Apply very  light coban. WOUND #5: - Foot Wound Laterality: Plantar, Left Cleanser: Soap and Water 3 x Per Week/15 Days Discharge Instructions: May shower and wash wound with dial antibacterial soap and water prior to dressing change. Cleanser: Wound Cleanser (Generic) 3 x Per Week/15 Days Discharge Instructions: Cleanse the wound with wound cleanser prior to applying a clean dressing using gauze sponges, not tissue or cotton balls. Peri-Wound Care: Triamcinolone 15 (g) 3 x Per Week/15 Days Discharge Instructions: Use triamcinolone 15 (g) as directed Peri-Wound Care: Sween Lotion (Moisturizing lotion) 3 x Per Week/15 Days Discharge Instructions: Apply moisturizing lotion as directed Prim Dressing: IODOFLEX 0.9% Cadexomer Iodine Pad 4x6 cm (Generic) 3 x Per Week/15 Days ary Discharge Instructions: Apply to wound bed as instructed Secondary Dressing: Woven Gauze Sponge, Non-Sterile 4x4 in (Generic) 3 x Per Week/15 Days Discharge Instructions: Apply over primary dressing as directed. Secondary Dressing: ABD Pad, 5x9 3 x Per Week/15 Days Discharge Instructions: Apply over primary dressing as directed. Com pression Wrap: Kerlix Roll 4.5x3.1 (in/yd) (Generic) 3 x Per Week/15 Days Discharge Instructions: Apply  Kerlix and Coban compression as directed. Com pression Wrap: Coban Self-Adherent Wrap 4x5 (in/yd) (Generic) 3 x Per Week/15 Days Discharge Instructions: Apply over Kerlix as directed. Apply very light coban. WOUND #7: - Lower Leg Wound Laterality: Left, Medial Cleanser: Soap and Water 3 x Per Week/15 Days Discharge Instructions: May shower and wash wound with dial antibacterial soap and water prior to dressing change. Cleanser: Wound Cleanser (Generic) 3 x Per Week/15 Days Discharge Instructions: Cleanse the wound with wound cleanser prior to applying a clean dressing using gauze sponges, not tissue or cotton balls. Peri-Wound Care: Triamcinolone 15 (g) 3 x Per Week/15 Days Discharge Instructions: Use triamcinolone 15 (g) as directed Peri-Wound Care: Sween Lotion (Moisturizing lotion) 3 x Per Week/15 Days Discharge Instructions: Apply moisturizing lotion as directed Prim Dressing: IODOFLEX 0.9% Cadexomer Iodine Pad 4x6 cm (Generic) 3 x Per Week/15 Days ary Discharge Instructions: Apply to wound bed as instructed Secondary Dressing: Woven Gauze Sponge, Non-Sterile 4x4 in (Generic) 3 x Per Week/15 Days Discharge Instructions: Apply over primary dressing as directed. Secondary Dressing: ABD Pad, 5x9 3 x Per Week/15 Days Discharge Instructions: Apply over primary dressing as directed. Com pression Wrap: Kerlix Roll 4.5x3.1 (in/yd) (Generic) 3 x Per Week/15 Days Discharge Instructions: Apply Kerlix and Coban compression as directed. Com pression Wrap: Coban Self-Adherent Wrap 4x5 (in/yd) (Generic) 3 x Per Week/15 Days Discharge Instructions: Apply over Kerlix as directed. Apply very light coban. WOUND #8: - Lower Leg Wound Laterality: Right, Lateral Cleanser: Soap and Water 3 x Per Week/15 Days Discharge Instructions: May shower and wash wound with dial antibacterial soap and water prior to dressing change. Cleanser: Wound Cleanser (Generic) 3 x Per Week/15 Days Discharge Instructions: Cleanse  the wound with wound cleanser prior to applying a clean dressing using gauze sponges, not tissue or cotton balls. Peri-Wound Care: Triamcinolone 15 (g) 3 x Per Week/15 Days Discharge Instructions: Use triamcinolone 15 (g) as directed Peri-Wound Care: Sween Lotion (Moisturizing lotion) 3 x Per Week/15 Days Discharge Instructions: Apply moisturizing lotion as directed Prim Dressing: IODOFLEX 0.9% Cadexomer Iodine Pad 4x6 cm (Generic) 3 x Per Week/15 Days ary Discharge Instructions: Apply to wound bed as instructed Secondary Dressing: Woven Gauze Sponge, Non-Sterile 4x4 in (Generic) 3 x Per Week/15 Days Discharge Instructions: Apply over primary dressing as directed. Secondary Dressing: ABD Pad, 5x9 3 x Per Week/15 Days Discharge Instructions: Apply over primary dressing as directed. Com pression Wrap: Kerlix Roll  4.5x3.1 (in/yd) (Generic) 3 x Per Week/15 Days Discharge Instructions: Apply Kerlix and Coban compression as directed. Com pression Wrap: Coban Self-Adherent Wrap 4x5 (in/yd) (Generic) 3 x Per Week/15 Days Discharge Instructions: Apply over Kerlix as directed. Apply very light coban. WOUND #9: - Lower Leg Wound Laterality: Left, Anterior, Proximal Cleanser: Soap and Water 3 x Per Week/15 Days Discharge Instructions: May shower and wash wound with dial antibacterial soap and water prior to dressing change. Cleanser: Wound Cleanser (Generic) 3 x Per Week/15 Days Discharge Instructions: Cleanse the wound with wound cleanser prior to applying a clean dressing using gauze sponges, not tissue or cotton balls. Peri-Wound Care: Triamcinolone 15 (g) 3 x Per Week/15 Days Discharge Instructions: Use triamcinolone 15 (g) as directed Peri-Wound Care: Sween Lotion (Moisturizing lotion) 3 x Per Week/15 Days Discharge Instructions: Apply moisturizing lotion as directed Prim Dressing: IODOFLEX 0.9% Cadexomer Iodine Pad 4x6 cm (Generic) 3 x Per Week/15 Days ary Discharge Instructions: Apply to wound  bed as instructed Secondary Dressing: Woven Gauze Sponge, Non-Sterile 4x4 in (Generic) 3 x Per Week/15 Days Discharge Instructions: Apply over primary dressing as directed. Secondary Dressing: ABD Pad, 5x9 3 x Per Week/15 Days Discharge Instructions: Apply over primary dressing as directed. Com pression Wrap: Kerlix Roll 4.5x3.1 (in/yd) (Generic) 3 x Per Week/15 Days Discharge Instructions: Apply Kerlix and Coban compression as directed. Com pression Wrap: Coban Self-Adherent Wrap 4x5 (in/yd) (Generic) 3 x Per Week/15 Days Discharge Instructions: Apply over Kerlix as directed. Apply very light coban. 1. We are using Iodoflex to all wound areas 2. Kerlix and light Coban wraps on her legs. 3. I have checked epic, as it does not seem like she has any follow-up 4. She has a [facility], still has a PICC line although I do not see that she is on antibiotics or not. By my notes this should have completed yesterday Electronic Signature(s) Signed: 08/06/2020 5:22:09 PM By: Baltazar Najjar MD Entered By: Baltazar Najjar on 08/06/2020 13:05:03 -------------------------------------------------------------------------------- SuperBill Details Patient Name: Date of Service: Tara Poe 08/06/2020 Medical Record Number: [id number] Patient Account Number: [id number] Date of Birth/Sex: Treating RN: 02/28/1936 (85 y.o. [name], [name] Primary Care Provider: [name] Other Clinician: Referring Provider: Treating Provider/Extender: Quenton Fetter in Treatment: 2 Diagnosis Coding ICD-10 Codes Code Description [phone] Type 2 diabetes mellitus with foot ulcer E11.51 Type 2 diabetes mellitus with diabetic peripheral angiopathy without gangrene I87.333 Chronic venous hypertension (idiopathic) with ulcer and inflammation of bilateral lower extremity L97.523 Non-pressure chronic ulcer of other part of left foot with necrosis of muscle L97.811 Non-pressure chronic ulcer  of other part of right lower leg limited to breakdown of skin L97.121 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 Facility Procedures CPT4 Code: [id number] Description: 11042 - DEB SUBQ TISSUE 20 SQ CM/< ICD-10 Diagnosis Description L97.523 Non-pressure chronic ulcer of other part of left foot with necrosis of muscle Modifier: Quantity: 1 CPT4 Code: [id number] Description: 11045 - DEB SUBQ TISS EA ADDL 20CM ICD-10 Diagnosis Description L97.523 Non-pressure chronic ulcer of other part of left foot with necrosis of muscle Modifier: Quantity: 1 Physician Procedures : CPT4 Code Description Modifier [id number] 11042 - WC PHYS SUBQ TISS 20 SQ CM ICD-10 Diagnosis Description L97.523 Non-pressure chronic ulcer of other part of left foot with necrosis of muscle Quantity: 1 : [id number] 11045 - WC PHYS SUBQ TISS EA ADDL 20 CM ICD-10 Diagnosis Description L97.523 Non-pressure chronic ulcer of other part of left foot with necrosis of muscle  Quantity: 1 Electronic Signature(s) Signed: 08/06/2020 5:22:09 PM By: Baltazar Najjar MD Entered By: Baltazar Najjar on 08/06/2020 13:05:17

## 2020-08-09 NOTE — Progress Notes (Signed)
Tara Flores, Tara Flores ([id number]) Visit Report for 08/06/2020 Arrival Information Details Patient Name: Date of Service: Tara Flores, Tara Flores 08/06/2020 11:00 A M Medical Record Number: [id number] Patient Account Number: [id number] Date of Birth/Sex: Treating RN: 1936/04/16 (85 y.o. Tara Flores, Tara Flores Primary Care Tawsha Terrero: [name] Other Clinician: Referring Trace Wirick: Treating Chey Cho/Extender: Quenton Fetter in Treatment: 2 Visit Information History Since Last Visit Added or deleted any medications: No Patient Arrived: Wheel Chair Any new allergies or adverse reactions: No Arrival Time: 11:30 Had a fall or experienced change in Yes Accompanied By: facility staff activities of daily living that may affect Transfer Assistance: None risk of falls: Patient Identification Verified: Yes Signs or symptoms of abuse/neglect since last visito No Secondary Verification Process Completed: Yes Hospitalized since last visit: No Patient Requires Transmission-Based Precautions: No Implantable device outside of the clinic excluding No Patient Has Alerts: Yes cellular tissue based products placed in the center Patient Alerts: R ABI = .74, L ABI= .60 since last visit: Has Dressing in Place as Prescribed: Yes Has Compression in Place as Prescribed: Yes Pain Present Now: No Electronic Signature(s) Signed: 08/06/2020 5:47:53 PM By: Zenaida Deed RN, BSN Entered By: Zenaida Deed on 08/06/2020 11:34:53 -------------------------------------------------------------------------------- Encounter Discharge Information Details Patient Name: Date of Service: Tara Flores 08/06/2020 11:00 A M Medical Record Number: [id number] Patient Account Number: [id number] Date of Birth/Sex: Treating RN: [birth year] (85 y.o. Wynelle Link Primary Care Nazaret Chea: [name] Other Clinician: Referring Tija Biss: Treating Tiawanna Luchsinger/Extender: Quenton Fetter in  Treatment: 2 Encounter Discharge Information Items Post Procedure Vitals Discharge Condition: Stable Temperature (F): 98 Ambulatory Status: Wheelchair Pulse (bpm): 68 Discharge Destination: Home Respiratory Rate (breaths/min): 18 Transportation: Private Auto Blood Pressure (mmHg): 95/56 Accompanied By: caregiver Schedule Follow-up Appointment: Yes Clinical Summary of Care: Patient Declined Electronic Signature(s) Signed: 08/06/2020 5:45:50 PM By: Zandra Abts RN, BSN Entered By: Zandra Abts on 08/06/2020 17:27:35 -------------------------------------------------------------------------------- Lower Extremity Assessment Details Patient Name: Date of Service: Tara Flores 08/06/2020 11:00 A M Medical Record Number: [id number] Patient Account Number: [id number] Date of Birth/Sex: Treating RN: 06-14-1936 (85 y.o. Tara Flores Primary Care Marlita Keil: [name] Other Clinician: Referring Deangela Randleman: Treating Justine Cossin/Extender: Quenton Fetter in Treatment: 2 Edema Assessment Assessed: Tara Flores: No] [Right: No] Edema: [Left: Yes] [Right: Yes] Calf Left: Right: Point of Measurement: From Medial Instep 40 cm 36.7 cm Ankle Left: Right: Point of Measurement: From Medial Instep 21.5 cm 21.2 cm Vascular Assessment Pulses: Dorsalis Pedis Palpable: [Left:No] [Right:No] Electronic Signature(s) Signed: 08/06/2020 5:47:53 PM By: Zenaida Deed RN, BSN Entered By: Zenaida Deed on 08/06/2020 11:46:47 -------------------------------------------------------------------------------- Multi Wound Chart Details Patient Name: Date of Service: Tara Flores 08/06/2020 11:00 A M Medical Record Number: [id number] Patient Account Number: [id number] Date of Birth/Sex: Treating RN: [birth year] (85 y.o. Tara Flores, Tara Flores Primary Care Syrah Daughtrey: [name] Other Clinician: Referring Ermina Oberman: Treating Mysha Peeler/Extender: Quenton Fetter in Treatment: 2 Vital Signs Height(in): 61 Pulse(bpm): 68 Weight(lbs): 157 Blood Pressure(mmHg): 95/56 Body Mass Index(BMI): 30 Temperature(F): 98 Respiratory Rate(breaths/min): 18 Photos: [10:No Photos Right, Proximal, Lateral Lower Leg] [5:No Photos Left, Plantar Foot] [6:No Photos Left, Anterior Lower Leg] Wound Location: [10:Gradually Appeared] [5:Gradually Appeared] [6:Gradually Appeared] Wounding Event: [10:Lymphedema] [5:Diabetic Wound/Ulcer of the Lower] [6:Venous Leg Ulcer] Primary Etiology: [10:N/A] [5:Extremity N/A] [6:Diabetic Wound/Ulcer of the Lower] Secondary Etiology: [10:Cataracts, Chronic sinus] [5:Cataracts, Chronic sinus] [6:Extremity Cataracts, Chronic sinus] Comorbid History: [10:problems/congestion, Anemia, Asthma, problems/congestion, Anemia, Asthma, problems/congestion, Anemia, Asthma, Chronic Obstructive Pulmonary Disease (COPD), Arrhythmia,  Congestive Heart Failure, Coronary Congestive Heart Failure,  Coronary Congestive Heart Failure, Coronary Artery Disease, Hypertension, Peripheral Arterial Disease, Colitis, Peripheral Arterial Disease, Colitis, Peripheral Arterial Disease, Colitis, Type II Diabetes, Osteoarthritis, Osteomyelitis, Neuropathy,  Confinement Anxiety 08/06/2020] [5:Chronic Obstructive Pulmonary Disease (COPD), Arrhythmia, Artery Disease, Hypertension, Type II Diabetes, Osteoarthritis, Osteomyelitis, Neuropathy, Confinement Anxiety 04/22/2020] [6:Chronic Obstructive Pulmonary Disease  (COPD), Arrhythmia, Artery Disease, Hypertension, Type II Diabetes, Osteoarthritis, Osteomyelitis, Neuropathy, Confinement Anxiety 06/22/2020] Date Acquired: [10:0] [5:2] [6:2] Weeks of Treatment: [10:Open] [5:Open] [6:Healed - Epithelialized] Wound Status: [10:0.9x0.9x0.1] [5:4.3x5.2x1.1] [6:0x0x0] Measurements L x W x D (cm) [10:0.636] [5:17.562] [6:0] A (cm) : rea [10:0.064] [5:19.318] [6:0] Volume (cm) : [10:N/A] [5:3.60%] [6:100.00%] % Reduction in  A [10:rea: N/A] [5:-32.50%] [6:100.00%] % Reduction in Volume: [10:Full Thickness Without Exposed] [5:Grade 3] [6:Full Thickness Without Exposed] Classification: [10:Support Structures Small] [5:Large] [6:Support Structures None Present] Exudate A mount: [10:Serous] [5:Serosanguineous] [6:N/A] Exudate Type: [10:amber] [5:red, brown] [6:N/A] Exudate Color: [10:Flat and Intact] [5:Epibole] [6:Flat and Intact] Wound Margin: [10:Small (1-33%)] [5:Small (1-33%)] [6:None Present (0%)] Granulation A mount: [10:Pink] [5:Pink] [6:N/A] Granulation Quality: [10:Large (67-100%)] [5:Large (67-100%)] [6:None Present (0%)] Necrotic A mount: [10:Fat Layer (Subcutaneous Tissue): Yes Fat Layer (Subcutaneous Tissue): Yes Fascia: No] Exposed Structures: [10:Fascia: No Tendon: No Muscle: No Joint: No Bone: No Small (1-33%)] [5:Fascia: No Tendon: No Muscle: No Joint: No Bone: No None] [6:Fat Layer (Subcutaneous Tissue): No Tendon: No Muscle: No Joint: No Bone: No Large (67-100%)] Epithelialization: [10:N/A] [5:Debridement - Excisional] [6:N/A] Debridement: Pre-procedure Verification/Time Out N/A [5:12:17] [6:N/A] Taken: [10:N/A] [5:Lidocaine] [6:N/A] Pain Control: [10:N/A] [5:Callus, Subcutaneous, Slough] [6:N/A] Tissue Debrided: [10:N/A] [5:Skin/Subcutaneous Tissue] [6:N/A] Level: [10:N/A] [5:22.36] [6:N/A] Debridement A (sq cm): [10:rea N/A] [5:Curette] [6:N/A] Instrument: [10:N/A] [5:Minimum] [6:N/A] Bleeding: [10:N/A] [5:Pressure] [6:N/A] Hemostasis A chieved: [10:N/A] [5:0] [6:N/A] Procedural Pain: [10:N/A] [5:0] [6:N/A] Post Procedural Pain: [10:N/A] [5:Procedure was tolerated well] [6:N/A] Debridement Treatment Response: [10:N/A] [5:4.3x5.2x1.1] [6:N/A] Post Debridement Measurements L x W x D (cm) [10:N/A] [5:19.318] [6:N/A] Post Debridement Volume: (cm) [10:N/A] [5:Debridement] [6:N/A] Wound Number: Photos: No Photos No Photos No Photos Left, Medial Lower Leg Right, Lateral Lower Leg Left,  Proximal, Anterior Lower Leg Wound Location: Gradually Appeared Gradually Appeared Gradually Appeared Wounding Event: Venous Leg Ulcer Venous Leg Ulcer Lymphedema Primary Etiology: Diabetic Wound/Ulcer of the Lower Diabetic Wound/Ulcer of the Lower N/A Secondary Etiology: Extremity Extremity Cataracts, Chronic sinus Cataracts, Chronic sinus Cataracts, Chronic sinus Comorbid History: problems/congestion, Anemia, Asthma, problems/congestion, Anemia, Asthma, problems/congestion, Anemia, Asthma, Chronic Obstructive Pulmonary Chronic Obstructive Pulmonary Chronic Obstructive Pulmonary Disease (COPD), Arrhythmia, Disease (COPD), Arrhythmia, Disease (COPD), Arrhythmia, Congestive Heart Failure, Coronary Congestive Heart Failure, Coronary Congestive Heart Failure, Coronary Artery Disease, Hypertension, Artery Disease, Hypertension, Artery Disease, Hypertension, Peripheral Arterial Disease, Colitis, Peripheral Arterial Disease, Colitis, Peripheral Arterial Disease, Colitis, Type II Diabetes, Osteoarthritis, Type II Diabetes, Osteoarthritis, Type II Diabetes, Osteoarthritis, Osteomyelitis, Neuropathy, Osteomyelitis, Neuropathy, Osteomyelitis, Neuropathy, Confinement Anxiety Confinement Anxiety Confinement Anxiety 06/22/2020 06/22/2020 08/06/2020 Date Acquired: 2 2 0 Weeks of Treatment: Open Open Open Wound Status: 2x1x0.1 0.3x0.3x0.1 0.3x0.3x0.1 Measurements L x W x D (cm) 1.571 0.071 0.071 A (cm) : rea 0.157 0.007 0.007 Volume (cm) : 1.90% 88.70% 0.00% % Reduction in Area: 1.90% 88.90% 0.00% % Reduction in Volume: Full Thickness Without Exposed Full Thickness Without Exposed Full Thickness Without Exposed Classification: Support Structures Support Structures Support Structures Medium Small Medium Exudate Amount: Serous Serous Serous Exudate Type: Psychologist, forensic Exudate Color: Flat and Intact Flat and Intact Flat and Intact Wound Margin: Small (1-33%) Large (67-100%)  Small  (1-33%) Granulation Amount: Pink Pink Pink Granulation Quality: Large (67-100%) None Present (0%) Large (67-100%) Necrotic Amount: Fat Layer (Subcutaneous Tissue): Yes Fascia: No Fat Layer (Subcutaneous Tissue): Yes Exposed Structures: Fascia: No Fat Layer (Subcutaneous Tissue): No Fascia: No Tendon: No Tendon: No Tendon: No Muscle: No Muscle: No Muscle: No Joint: No Joint: No Joint: No Bone: No Bone: No Bone: No Limited to Skin Breakdown Small (1-33%) Large (67-100%) None Epithelialization: N/A N/A N/A Debridement: N/A N/A N/A Pain Control: N/A N/A N/A Tissue Debrided: N/A N/A N/A Level: N/A N/A N/A Debridement A (sq cm): rea N/A N/A N/A Instrument: N/A N/A N/A Bleeding: N/A N/A N/A Hemostasis A chieved: N/A N/A N/A Procedural Pain: N/A N/A N/A Post Procedural Pain: N/A N/A N/A Debridement Treatment Response: N/A N/A N/A Post Debridement Measurements L x W x D (cm) N/A N/A N/A Post Debridement Volume: (cm) N/A N/A N/A Procedures Performed: Treatment Notes Electronic Signature(s) Signed: 08/06/2020 5:22:09 PM By: Baltazar Najjar MD Signed: 08/09/2020 5:09:47 PM By: Fonnie Mu RN Entered By: Baltazar Najjar on 08/06/2020 12:59:54 -------------------------------------------------------------------------------- Multi-Disciplinary Care Plan Details Patient Name: Date of Service: Tara Flores. 08/06/2020 11:00 A M Medical Record Number: [id number] Patient Account Number: [id number] Date of Birth/Sex: Treating RN: 11-22-1935 (84 y.o. Tara Flores, Tara Flores Primary Care Shaneya Taketa: [name] Other Clinician: Referring Marchella Hibbard: Treating Dvid Pendry/Extender: Quenton Fetter in Treatment: 2 Active Inactive Orientation to the Wound Care Program Nursing Diagnoses: Knowledge deficit related to the wound healing center program Goals: Patient/caregiver will verbalize understanding of the Wound Healing Center Program Date  Initiated: 07/23/2020 Target Resolution Date: 08/30/2020 Goal Status: Active Interventions: Provide education on orientation to the wound center Notes: Wound/Skin Impairment Nursing Diagnoses: Impaired tissue integrity Knowledge deficit related to ulceration/compromised skin integrity Goals: Patient will demonstrate a reduced rate of smoking or cessation of smoking Date Initiated: 07/23/2020 Target Resolution Date: 08/30/2020 Goal Status: Active Patient will have a decrease in wound volume by X% from date: (specify in notes) Date Initiated: 07/23/2020 Target Resolution Date: 08/30/2020 Goal Status: Active Patient/caregiver will verbalize understanding of skin care regimen Date Initiated: 07/23/2020 Target Resolution Date: 08/30/2020 Goal Status: Active Interventions: Assess patient/caregiver ability to obtain necessary supplies Assess patient/caregiver ability to perform ulcer/skin care regimen upon admission and as needed Assess ulceration(s) every visit Provide education on smoking Notes: Electronic Signature(s) Signed: 08/09/2020 5:09:47 PM By: Fonnie Mu RN Entered By: Fonnie Mu on 08/06/2020 12:27:15 -------------------------------------------------------------------------------- Pain Assessment Details Patient Name: Date of Service: Tara Flores 08/06/2020 11:00 A M Medical Record Number: [id number] Patient Account Number: [id number] Date of Birth/Sex: Treating RN: 1935/12/18 (85 y.o. Tara Flores Primary Care Kaleia Longhi: [name] Other Clinician: Referring Ark Agrusa: Treating Daris Harkins/Extender: Quenton Fetter in Treatment: 2 Active Problems Location of Pain Severity and Description of Pain Patient Has Paino No Site Locations Rate the pain. Current Pain Level: 0 Pain Management and Medication Current Pain Management: Electronic Signature(s) Signed: 08/06/2020 5:47:53 PM By: Zenaida Deed RN, BSN Entered By: Zenaida Deed on 08/06/2020 11:36:08 -------------------------------------------------------------------------------- Patient/Caregiver Education Details Patient Name: Date of Service: Tara Flores, Tara L. 2/15/2022andnbsp11:00 A M Medical Record Number: [id number] Patient Account Number: [id number] Date of Birth/Gender: Treating RN: December 11, 1935 (85 y.o. Tara Flores, Tara Flores Primary Care Physician: [name] Other Clinician: Referring Physician: Treating Physician/Extender: Quenton Fetter in Treatment: 2 Education Assessment Education Provided To: Patient Education Topics Provided Welcome T [facility]: o Methods: Explain/Verbal Responses: State content correctly Electronic Signature(s) Signed: 08/09/2020 5:09:47  PM By: Fonnie Mu RN Entered By: Fonnie Mu on 08/06/2020 12:27:29 -------------------------------------------------------------------------------- Wound Assessment Details Patient Name: Date of Service: Tara Flores 08/06/2020 11:00 A M Medical Record Number: [id number] Patient Account Number: [id number] Date of Birth/Sex: Treating RN: 30-Jan-1936 (85 y.o. Tara Flores, Tara Flores Primary Care Mehki Klumpp: [name] Other Clinician: Referring Cydnie Deason: Treating Mikeya Tomasetti/Extender: Quenton Fetter in Treatment: 2 Wound Status Wound Number: 10 Primary Lymphedema Etiology: Wound Location: Right, Proximal, Lateral Lower Leg Wound Open Wounding Event: Gradually Appeared Status: Date Acquired: 08/06/2020 Comorbid Cataracts, Chronic sinus problems/congestion, Anemia, Asthma, Weeks Of Treatment: 0 History: Chronic Obstructive Pulmonary Disease (COPD), Arrhythmia, Clustered Wound: No Congestive Heart Failure, Coronary Artery Disease, Hypertension, Peripheral Arterial Disease, Colitis, Type II Diabetes, Osteoarthritis, Osteomyelitis, Neuropathy, Confinement Anxiety Photos Photo Uploaded By: Benjaman Kindler on  08/07/2020 13:30:09 Wound Measurements Length: (cm) 0.9 Width: (cm) 0.9 Depth: (cm) 0.1 Area: (cm) 0.636 Volume: (cm) 0.064 % Reduction in Area: % Reduction in Volume: Epithelialization: Small (1-33%) Tunneling: No Undermining: No Wound Description Classification: Full Thickness Without Exposed Support Structures Wound Margin: Flat and Intact Exudate Amount: Small Exudate Type: Serous Exudate Color: amber Foul Odor After Cleansing: No Slough/Fibrino No Wound Bed Granulation Amount: Small (1-33%) Exposed Structure Granulation Quality: Pink Fascia Exposed: No Necrotic Amount: Large (67-100%) Fat Layer (Subcutaneous Tissue) Exposed: Yes Necrotic Quality: Adherent Slough Tendon Exposed: No Muscle Exposed: No Joint Exposed: No Bone Exposed: No Treatment Notes Wound #10 (Lower Leg) Wound Laterality: Right, Lateral, Proximal Cleanser Soap and Water Discharge Instruction: May shower and wash wound with dial antibacterial soap and water prior to dressing change. Wound Cleanser Discharge Instruction: Cleanse the wound with wound cleanser prior to applying a clean dressing using gauze sponges, not tissue or cotton balls. Peri-Wound Care Triamcinolone 15 (g) Discharge Instruction: Use triamcinolone 15 (g) as directed Sween Lotion (Moisturizing lotion) Discharge Instruction: Apply moisturizing lotion as directed Topical Primary Dressing IODOFLEX 0.9% Cadexomer Iodine Pad 4x6 cm Discharge Instruction: Apply to wound bed as instructed Secondary Dressing Woven Gauze Sponge, Non-Sterile 4x4 in Discharge Instruction: Apply over primary dressing as directed. ABD Pad, 5x9 Discharge Instruction: Apply over primary dressing as directed. Secured With Compression Wrap Kerlix Roll 4.5x3.1 (in/yd) Discharge Instruction: Apply Kerlix and Coban compression as directed. Coban Self-Adherent Wrap 4x5 (in/yd) Discharge Instruction: Apply over Kerlix as directed. Apply very light  coban. Compression Stockings Add-Ons Electronic Signature(s) Signed: 08/06/2020 5:47:53 PM By: Zenaida Deed RN, BSN Entered By: Zenaida Deed on 08/06/2020 11:56:06 -------------------------------------------------------------------------------- Wound Assessment Details Patient Name: Date of Service: Tara Flores 08/06/2020 11:00 A M Medical Record Number: [id number] Patient Account Number: [id number] Date of Birth/Sex: Treating RN: [birth year] (85 y.o. Tara Flores, Tara Flores Primary Care Kalicia Dufresne: [name] Other Clinician: Referring Doneen Ollinger: Treating Charla Criscione/Extender: Quenton Fetter in Treatment: 2 Wound Status Wound Number: 5 Primary Diabetic Wound/Ulcer of the Lower Extremity Etiology: Wound Location: Left, Plantar Foot Wound Open Wounding Event: Gradually Appeared Status: Date Acquired: 04/22/2020 Comorbid Cataracts, Chronic sinus problems/congestion, Anemia, Asthma, Weeks Of Treatment: 2 History: Chronic Obstructive Pulmonary Disease (COPD), Arrhythmia, Clustered Wound: No Congestive Heart Failure, Coronary Artery Disease, Hypertension, Peripheral Arterial Disease, Colitis, Type II Diabetes, Osteoarthritis, Osteomyelitis, Neuropathy, Confinement Anxiety Photos Photo Uploaded By: Benjaman Kindler on 08/07/2020 13:23:50 Wound Measurements Length: (cm) 4.3 Width: (cm) 5.2 Depth: (cm) 1.1 Area: (cm) 17.562 Volume: (cm) 19.318 % Reduction in Area: 3.6% % Reduction in Volume: -32.5% Epithelialization: None Tunneling: No Undermining: No Wound Description Classification: Grade 3 Wound Margin: Epibole Exudate Amount: Large Exudate Type:  Serosanguineous Exudate Color: red, brown Foul Odor After Cleansing: No Slough/Fibrino Yes Wound Bed Granulation Amount: Small (1-33%) Exposed Structure Granulation Quality: Pink Fascia Exposed: No Necrotic Amount: Large (67-100%) Fat Layer (Subcutaneous Tissue) Exposed: Yes Necrotic Quality:  Adherent Slough Tendon Exposed: No Muscle Exposed: No Joint Exposed: No Bone Exposed: No Treatment Notes Wound #5 (Foot) Wound Laterality: Plantar, Left Cleanser Soap and Water Discharge Instruction: May shower and wash wound with dial antibacterial soap and water prior to dressing change. Wound Cleanser Discharge Instruction: Cleanse the wound with wound cleanser prior to applying a clean dressing using gauze sponges, not tissue or cotton balls. Peri-Wound Care Triamcinolone 15 (g) Discharge Instruction: Use triamcinolone 15 (g) as directed Sween Lotion (Moisturizing lotion) Discharge Instruction: Apply moisturizing lotion as directed Topical Primary Dressing IODOFLEX 0.9% Cadexomer Iodine Pad 4x6 cm Discharge Instruction: Apply to wound bed as instructed Secondary Dressing Woven Gauze Sponge, Non-Sterile 4x4 in Discharge Instruction: Apply over primary dressing as directed. ABD Pad, 5x9 Discharge Instruction: Apply over primary dressing as directed. Secured With Compression Wrap Kerlix Roll 4.5x3.1 (in/yd) Discharge Instruction: Apply Kerlix and Coban compression as directed. Coban Self-Adherent Wrap 4x5 (in/yd) Discharge Instruction: Apply over Kerlix as directed. Apply very light coban. Compression Stockings Add-Ons Electronic Signature(s) Signed: 08/06/2020 5:47:53 PM By: Zenaida Deed RN, BSN Entered By: Zenaida Deed on 08/06/2020 11:57:31 -------------------------------------------------------------------------------- Wound Assessment Details Patient Name: Date of Service: Tara Flores 08/06/2020 11:00 A M Medical Record Number: [id number] Patient Account Number: [id number] Date of Birth/Sex: Treating RN: [birth year] (85 y.o. Tara Flores, Tara Flores Primary Care Linsi Humann: [name] Other Clinician: Referring Natividad Halls: Treating Elenie Coven/Extender: Quenton Fetter in Treatment: 2 Wound Status Wound Number: 6 Primary Venous Leg  Ulcer Etiology: Wound Location: Left, Anterior Lower Leg Secondary Diabetic Wound/Ulcer of the Lower Extremity Wounding Event: Gradually Appeared Etiology: Date Acquired: 06/22/2020 Wound Healed - Epithelialized Weeks Of Treatment: 2 Status: Clustered Wound: No Comorbid Cataracts, Chronic sinus problems/congestion, Anemia, Asthma, History: Chronic Obstructive Pulmonary Disease (COPD), Arrhythmia, Congestive Heart Failure, Coronary Artery Disease, Hypertension, Peripheral Arterial Disease, Colitis, Type II Diabetes, Osteoarthritis, Osteomyelitis, Neuropathy, Confinement Anxiety Photos Photo Uploaded By: Benjaman Kindler on 08/07/2020 13:23:50 Wound Measurements Length: (cm) Width: (cm) Depth: (cm) Area: (cm) Volume: (cm) Wound Description Classification: Full Thickness Without Exposed Support Structu Wound Margin: Flat and Intact Exudate Amount: None Present res Foul Odor After Cleansing: Slough/Fibrino 0 % Reduction in Area: 100% 0 % Reduction in Volume: 100% 0 Epithelialization: Large (67-100%) 0 Tunneling: No 0 Undermining: No No No Wound Bed Granulation Amount: None Present (0%) Exposed Structure Necrotic Amount: None Present (0%) Fascia Exposed: No Fat Layer (Subcutaneous Tissue) Exposed: No Tendon Exposed: No Muscle Exposed: No Joint Exposed: No Bone Exposed: No Electronic Signature(s) Signed: 08/06/2020 5:47:53 PM By: Zenaida Deed RN, BSN Entered By: Zenaida Deed on 08/06/2020 11:57:47 -------------------------------------------------------------------------------- Wound Assessment Details Patient Name: Date of Service: Tara Flores 08/06/2020 11:00 A M Medical Record Number: [id number] Patient Account Number: [id number] Date of Birth/Sex: Treating RN: [birth year] (85 y.o. Tara Flores Primary Care Edelmiro Innocent: [name] Other Clinician: Referring Latresha Yahr: Treating Sparsh Callens/Extender: Quenton Fetter in Treatment:  2 Wound Status Wound Number: 7 Primary Venous Leg Ulcer Etiology: Wound Location: Left, Medial Lower Leg Secondary Diabetic Wound/Ulcer of the Lower Extremity Wounding Event: Gradually Appeared Etiology: Date Acquired: 06/22/2020 Wound Open Weeks Of Treatment: 2 Status: Clustered Wound: No Comorbid Cataracts, Chronic sinus problems/congestion, Anemia, Asthma, History: Chronic Obstructive Pulmonary Disease (COPD), Arrhythmia, Congestive Heart Failure, Coronary Artery Disease, Hypertension,  Peripheral Arterial Disease, Colitis, Type II Diabetes, Osteoarthritis, Osteomyelitis, Neuropathy, Confinement Anxiety Photos Photo Uploaded By: Benjaman Kindler on 08/07/2020 13:29:47 Wound Measurements Length: (cm) 2 Width: (cm) 1 Depth: (cm) 0.1 Area: (cm) 1.571 Volume: (cm) 0.157 % Reduction in Area: 1.9% % Reduction in Volume: 1.9% Epithelialization: Small (1-33%) Tunneling: No Undermining: No Wound Description Classification: Full Thickness Without Exposed Support Structu Wound Margin: Flat and Intact Exudate Amount: Medium Exudate Type: Serous Exudate Color: amber res Foul Odor After Cleansing: No Slough/Fibrino No Wound Bed Granulation Amount: Small (1-33%) Exposed Structure Granulation Quality: Pink Fascia Exposed: No Necrotic Amount: Large (67-100%) Fat Layer (Subcutaneous Tissue) Exposed: Yes Necrotic Quality: Adherent Slough Tendon Exposed: No Muscle Exposed: No Joint Exposed: No Bone Exposed: No Treatment Notes Wound #7 (Lower Leg) Wound Laterality: Left, Medial Cleanser Soap and Water Discharge Instruction: May shower and wash wound with dial antibacterial soap and water prior to dressing change. Wound Cleanser Discharge Instruction: Cleanse the wound with wound cleanser prior to applying a clean dressing using gauze sponges, not tissue or cotton balls. Peri-Wound Care Triamcinolone 15 (g) Discharge Instruction: Use triamcinolone 15 (g) as directed Sween Lotion  (Moisturizing lotion) Discharge Instruction: Apply moisturizing lotion as directed Topical Primary Dressing IODOFLEX 0.9% Cadexomer Iodine Pad 4x6 cm Discharge Instruction: Apply to wound bed as instructed Secondary Dressing Woven Gauze Sponge, Non-Sterile 4x4 in Discharge Instruction: Apply over primary dressing as directed. ABD Pad, 5x9 Discharge Instruction: Apply over primary dressing as directed. Secured With Compression Wrap Kerlix Roll 4.5x3.1 (in/yd) Discharge Instruction: Apply Kerlix and Coban compression as directed. Coban Self-Adherent Wrap 4x5 (in/yd) Discharge Instruction: Apply over Kerlix as directed. Apply very light coban. Compression Stockings Add-Ons Electronic Signature(s) Signed: 08/06/2020 5:47:53 PM By: Zenaida Deed RN, BSN Entered By: Zenaida Deed on 08/06/2020 11:58:04 -------------------------------------------------------------------------------- Wound Assessment Details Patient Name: Date of Service: Tara Flores 08/06/2020 11:00 A M Medical Record Number: [id number] Patient Account Number: [id number] Date of Birth/Sex: Treating RN: [birth year] (85 y.o. Tara Flores, Tara Flores Primary Care Rola Lennon: [name] Other Clinician: Referring Dravon Nott: Treating Lil Lepage/Extender: Quenton Fetter in Treatment: 2 Wound Status Wound Number: 8 Primary Venous Leg Ulcer Etiology: Wound Location: Right, Lateral Lower Leg Secondary Diabetic Wound/Ulcer of the Lower Extremity Wounding Event: Gradually Appeared Etiology: Date Acquired: 06/22/2020 Wound Open Weeks Of Treatment: 2 Status: Clustered Wound: No Comorbid Cataracts, Chronic sinus problems/congestion, Anemia, Asthma, History: Chronic Obstructive Pulmonary Disease (COPD), Arrhythmia, Congestive Heart Failure, Coronary Artery Disease, Hypertension, Peripheral Arterial Disease, Colitis, Type II Diabetes, Osteoarthritis, Osteomyelitis, Neuropathy, Confinement  Anxiety Photos Photo Uploaded By: Benjaman Kindler on 08/07/2020 13:30:10 Wound Measurements Length: (cm) 0.3 Width: (cm) 0.3 Depth: (cm) 0.1 Area: (cm) 0.071 Volume: (cm) 0.007 % Reduction in Area: 88.7% % Reduction in Volume: 88.9% Epithelialization: Large (67-100%) Tunneling: No Undermining: No Wound Description Classification: Full Thickness Without Exposed Support Structures Wound Margin: Flat and Intact Exudate Amount: Small Exudate Type: Serous Exudate Color: amber Foul Odor After Cleansing: No Slough/Fibrino No Wound Bed Granulation Amount: Large (67-100%) Exposed Structure Granulation Quality: Pink Fascia Exposed: No Necrotic Amount: None Present (0%) Fat Layer (Subcutaneous Tissue) Exposed: No Tendon Exposed: No Muscle Exposed: No Joint Exposed: No Bone Exposed: No Limited to Skin Breakdown Treatment Notes Wound #8 (Lower Leg) Wound Laterality: Right, Lateral Cleanser Soap and Water Discharge Instruction: May shower and wash wound with dial antibacterial soap and water prior to dressing change. Wound Cleanser Discharge Instruction: Cleanse the wound with wound cleanser prior to applying a clean dressing using gauze sponges, not tissue  or cotton balls. Peri-Wound Care Triamcinolone 15 (g) Discharge Instruction: Use triamcinolone 15 (g) as directed Sween Lotion (Moisturizing lotion) Discharge Instruction: Apply moisturizing lotion as directed Topical Primary Dressing IODOFLEX 0.9% Cadexomer Iodine Pad 4x6 cm Discharge Instruction: Apply to wound bed as instructed Secondary Dressing Woven Gauze Sponge, Non-Sterile 4x4 in Discharge Instruction: Apply over primary dressing as directed. ABD Pad, 5x9 Discharge Instruction: Apply over primary dressing as directed. Secured With Compression Wrap Kerlix Roll 4.5x3.1 (in/yd) Discharge Instruction: Apply Kerlix and Coban compression as directed. Coban Self-Adherent Wrap 4x5 (in/yd) Discharge Instruction: Apply  over Kerlix as directed. Apply very light coban. Compression Stockings Add-Ons Electronic Signature(s) Signed: 08/06/2020 5:47:53 PM By: Zenaida DeedBoehlein, Linda RN, BSN Entered By: Zenaida DeedBoehlein, Tara Flores on 08/06/2020 11:58:21 -------------------------------------------------------------------------------- Wound Assessment Details Patient Name: Date of Service: Tara Flores, Tara L. 08/06/2020 11:00 A M Medical Record Number: [id number] Patient Account Number: [id number] Date of Birth/Sex: Treating RN: 09/25/1935 (85 y.o. Tara CoastF) Tara Flores, Tara Flores Primary Care Dewain Platz: Anson FretAriza, Fernando Other Clinician: Referring Ayeden Gladman: Treating Maday Guarino/Extender: Quenton Fetterobson, Michael Ariza, Fernando Weeks in Treatment: 2 Wound Status Wound Number: 9 Primary Lymphedema Etiology: Wound Location: Left, Proximal, Anterior Lower Leg Wound Open Wounding Event: Gradually Appeared Status: Date Acquired: 08/06/2020 Comorbid Cataracts, Chronic sinus problems/congestion, Anemia, Asthma, Weeks Of Treatment: 0 History: Chronic Obstructive Pulmonary Disease (COPD), Arrhythmia, Clustered Wound: No Congestive Heart Failure, Coronary Artery Disease, Hypertension, Peripheral Arterial Disease, Colitis, Type II Diabetes, Osteoarthritis, Osteomyelitis, Neuropathy, Confinement Anxiety Photos Photo Uploaded By: Benjaman KindlerJones, Dedrick on 08/07/2020 13:29:48 Wound Measurements Length: (cm) 0.3 Width: (cm) 0.3 Depth: (cm) 0.1 Area: (cm) 0.071 Volume: (cm) 0.007 % Reduction in Area: 0% % Reduction in Volume: 0% Epithelialization: None Tunneling: No Undermining: No Wound Description Classification: Full Thickness Without Exposed Support Structures Wound Margin: Flat and Intact Exudate Amount: Medium Exudate Type: Serous Exudate Color: amber Foul Odor After Cleansing: No Slough/Fibrino Yes Wound Bed Granulation Amount: Small (1-33%) Exposed Structure Granulation Quality: Pink Fascia Exposed: No Necrotic Amount: Large (67-100%) Fat Layer  (Subcutaneous Tissue) Exposed: Yes Necrotic Quality: Adherent Slough Tendon Exposed: No Muscle Exposed: No Joint Exposed: No Bone Exposed: No Treatment Notes Wound #9 (Lower Leg) Wound Laterality: Left, Anterior, Proximal Cleanser Soap and Water Discharge Instruction: May shower and wash wound with dial antibacterial soap and water prior to dressing change. Wound Cleanser Discharge Instruction: Cleanse the wound with wound cleanser prior to applying a clean dressing using gauze sponges, not tissue or cotton balls. Peri-Wound Care Triamcinolone 15 (g) Discharge Instruction: Use triamcinolone 15 (g) as directed Sween Lotion (Moisturizing lotion) Discharge Instruction: Apply moisturizing lotion as directed Topical Primary Dressing IODOFLEX 0.9% Cadexomer Iodine Pad 4x6 cm Discharge Instruction: Apply to wound bed as instructed Secondary Dressing Woven Gauze Sponge, Non-Sterile 4x4 in Discharge Instruction: Apply over primary dressing as directed. ABD Pad, 5x9 Discharge Instruction: Apply over primary dressing as directed. Secured With Compression Wrap Kerlix Roll 4.5x3.1 (in/yd) Discharge Instruction: Apply Kerlix and Coban compression as directed. Coban Self-Adherent Wrap 4x5 (in/yd) Discharge Instruction: Apply over Kerlix as directed. Apply very light coban. Compression Stockings Add-Ons Electronic Signature(s) Signed: 08/06/2020 5:47:53 PM By: Zenaida DeedBoehlein, Linda RN, BSN Entered By: Zenaida DeedBoehlein, Tara Flores on 08/06/2020 11:58:53 -------------------------------------------------------------------------------- Vitals Details Patient Name: Date of Service: Tara Flores, Tara L. 08/06/2020 11:00 A M Medical Record Number: [id number] Patient Account Number: [id number] Date of Birth/Sex: Treating RN: 11/29/1935 (85 y.o. Tara Flores) Tara Flores, Tara Flores Primary Care Jahari Wiginton: Anson FretAriza, Fernando Other Clinician: Referring Kloee Ballew: Treating Neena Beecham/Extender: Quenton Fetterobson, Michael Ariza, Fernando Weeks in Treatment:  2 Vital Signs Time Taken: 11:35 Temperature (F): 98  Height (in): 61 Pulse (bpm): 68 Source: Stated Respiratory Rate (breaths/min): 18 Weight (lbs): 157 Blood Pressure (mmHg): 95/56 Source: Stated Reference Range: 80 - 120 mg / dl Body Mass Index (BMI): 29.7 Electronic Signature(s) Signed: 08/06/2020 5:47:53 PM By: Zenaida Deed RN, BSN Entered By: Zenaida Deed on 08/06/2020 11:35:57

## 2020-08-20 ENCOUNTER — Encounter (HOSPITAL_BASED_OUTPATIENT_CLINIC_OR_DEPARTMENT_OTHER): Payer: Medicare Other | Attending: Internal Medicine | Admitting: Internal Medicine

## 2020-08-20 DIAGNOSIS — L97121 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: Secondary | ICD-10-CM | POA: Insufficient documentation

## 2020-08-20 DIAGNOSIS — E11621 Type 2 diabetes mellitus with foot ulcer: Secondary | ICD-10-CM | POA: Insufficient documentation

## 2020-08-20 DIAGNOSIS — L97811 Non-pressure chronic ulcer of other part of right lower leg limited to breakdown of skin: Secondary | ICD-10-CM | POA: Insufficient documentation

## 2020-08-20 DIAGNOSIS — L97523 Non-pressure chronic ulcer of other part of left foot with necrosis of muscle: Secondary | ICD-10-CM | POA: Insufficient documentation

## 2020-08-20 DIAGNOSIS — E1151 Type 2 diabetes mellitus with diabetic peripheral angiopathy without gangrene: Secondary | ICD-10-CM | POA: Insufficient documentation

## 2020-08-20 DIAGNOSIS — I87333 Chronic venous hypertension (idiopathic) with ulcer and inflammation of bilateral lower extremity: Secondary | ICD-10-CM | POA: Insufficient documentation

## 2020-08-27 ENCOUNTER — Encounter (HOSPITAL_BASED_OUTPATIENT_CLINIC_OR_DEPARTMENT_OTHER): Payer: Medicare Other | Admitting: Physician Assistant

## 2020-08-27 ENCOUNTER — Other Ambulatory Visit: Payer: Self-pay

## 2020-08-27 DIAGNOSIS — L97529 Non-pressure chronic ulcer of other part of left foot with unspecified severity: Secondary | ICD-10-CM | POA: Diagnosis present

## 2020-08-27 DIAGNOSIS — L97121 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: Secondary | ICD-10-CM | POA: Diagnosis not present

## 2020-08-27 DIAGNOSIS — E1151 Type 2 diabetes mellitus with diabetic peripheral angiopathy without gangrene: Secondary | ICD-10-CM | POA: Diagnosis not present

## 2020-08-27 DIAGNOSIS — I87333 Chronic venous hypertension (idiopathic) with ulcer and inflammation of bilateral lower extremity: Secondary | ICD-10-CM | POA: Diagnosis not present

## 2020-08-27 DIAGNOSIS — L97811 Non-pressure chronic ulcer of other part of right lower leg limited to breakdown of skin: Secondary | ICD-10-CM | POA: Diagnosis not present

## 2020-08-27 DIAGNOSIS — E11621 Type 2 diabetes mellitus with foot ulcer: Secondary | ICD-10-CM | POA: Diagnosis not present

## 2020-08-27 DIAGNOSIS — L97523 Non-pressure chronic ulcer of other part of left foot with necrosis of muscle: Secondary | ICD-10-CM | POA: Diagnosis not present

## 2020-08-27 NOTE — Progress Notes (Addendum)
Tara, Flores ([id number]) Visit Report for 08/27/2020 Chief Complaint Document Details Patient Name: Date of Service: Tara Flores, Tara Flores 08/27/2020 1:45 PM Medical Record Number: [id number] Patient Account Number: [id number] Date of Birth/Sex: Treating RN: 1936-03-17 (85 y.o. [name], [name] Primary Care Provider: [name] Other Clinician: Referring Provider: Treating Provider/Extender: Imogene Burn in Treatment: 5 Information Obtained from: Patient Chief Complaint spontaneous wounds both legs. no pain. 2 courses of Keflex. complication of dm, legally blid 07/23/2020; patient is here for review of the left plantar foot wound and wounds on her bilateral anterior lower legs Electronic Signature(s) Signed: 08/27/2020 2:27:15 PM By: Lenda Kelp PA-C Entered By: Lenda Kelp on 08/27/2020 14:27:14 -------------------------------------------------------------------------------- Debridement Details Patient Name: Date of Service: Tara Flores 08/27/2020 1:45 PM Medical Record Number: [id number] Patient Account Number: [id number] Date of Birth/Sex: Treating RN: [birth year] (85 y.o. [name], [name] Primary Care Provider: [name] Other Clinician: Referring Provider: Treating Provider/Extender: Imogene Burn in Treatment: 5 Debridement Performed for Assessment: Wound #5 Left,Plantar Foot Performed By: Physician Lenda Kelp, PA Debridement Type: Debridement Severity of Tissue Pre Debridement: Fat layer exposed Level of Consciousness (Pre-procedure): Awake and Alert Pre-procedure Verification/Time Out Yes - 15:00 Taken: Start Time: 15:00 Pain Control: Other : Benz T Area Debrided (L x W): otal 3.9 (cm) x 5 (cm) = 19.5 (cm) Tissue and other material debrided: Viable, Non-Viable, Slough, Subcutaneous, Skin: Dermis , Slough Level: Skin/Subcutaneous Tissue Debridement Description: Excisional Instrument:  Curette Bleeding: Minimum Hemostasis Achieved: Pressure End Time: 15:01 Procedural Pain: 0 Post Procedural Pain: 0 Response to Treatment: Procedure was tolerated well Level of Consciousness (Post- Awake and Alert procedure): Post Debridement Measurements of Total Wound Length: (cm) 3.9 Width: (cm) 5 Depth: (cm) 0.6 Volume: (cm) 9.189 Character of Wound/Ulcer Post Debridement: Improved Severity of Tissue Post Debridement: Fat layer exposed Post Procedure Diagnosis Same as Pre-procedure Electronic Signature(s) Signed: 08/28/2020 8:28:17 AM By: Lenda Kelp PA-C Signed: 08/28/2020 5:36:09 PM By: Fonnie Mu RN Entered By: Fonnie Mu on 08/27/2020 15:01:30 -------------------------------------------------------------------------------- HPI Details Patient Name: Date of Service: Tara Flores 08/27/2020 1:45 PM Medical Record Number: [id number] Patient Account Number: [id number] Date of Birth/Sex: Treating RN: 30-Oct-1935 (85 y.o. [name], [name] Primary Care Provider: [name] Other Clinician: Referring Provider: Treating Provider/Extender: Imogene Burn in Treatment: 5 History of Present Illness Location: both legs Quality: Patient reports No Pain. Severity: Patient states wound(s) are getting better. Modifying Factors: has had keflex HPI Description: dm with neuropathy and retinopathy. stasis changes surround the wounds. edema is minimal. feet are very dry and pulsesless. abi bilat around 0.7 READMISSION 07/23/2020 This is an 85 year old woman who comes from [facility] skilled facility where she is apparently been a resident for 3 years. She was here and saw Dr. Wiliam Ke I believe in 2016 with wounds on her bilateral legs. At some point she had a left transmetatarsal amputation but I do not really have any information on this. Furthermore I am not really certain what they have been dressing her wounds with. She has a large area on  the plantar left foot and smaller areas on the bilateral anterior lower legs. She was hospitalized from 06/25/2020 through 07/03/2020 discovered to have a left foot ulcer with underlying osteomyelitis of the left cuboid. She was either seen or consulted with ID and she is on IV Rocephin and vancomycin with the Rocephin finishing on February 14 and the vancomycin on the 21st.  I am not really sure what follow-up she has. She was also revascularized during this hospitalization with an angiogram showing left SFA stenosis. She underwent stenting of the left SFA stenting of the PTA and left peroneal arteries. She refused an amputation on the left. I am not sure what they are putting on any of these wounds although she does have a kerlix Coban wrap. She is a poorly controlled type II diabetic she also is a continued smoker Past medical history includes atrial fibrillation, chronic venous insufficiency, type 2 diabetes with most recent hemoglobin A1c of 9.6, chronic diastolic heart failure, peripheral vascular disease, left foot transmet apparently in April 2018. Her post revascularization ABIs were 0.74 on the right and 0.60 on the left. Waveforms were monophasic this was on 06/25/2020. T oday we obtained 0.57 on the right and 0.6 5 on the left. MRI on 1/10 showed osteomyelitis of the plantar aspect of the cuboid. No additional sites of acute osteomyelitis noted prior forefoot amputation and no abscess I have reviewed her angiogram which resulted in a laser atherectomy of the left SFA and left peroneal artery. Angioplasty of the left SFA and angioplasty of the left peroneal I do not see mentions of stents however. 08/06/2018 2 seconds; second visit in the clinic for this woman with a substantial wound on the left plantar foot just below her left TMA. She was hospitalized for this and underwent a substantial revascularization. MRI suggested osteomyelitis. By my notes from last time the antibiotic should have  finished although apparently she is still getting antibiotics and still has her PICC line. I cannot see looking through [place] link that she foot has a follow-up with vascular or ID. We have been using Iodoflex on this area to hopefully get to a better surface she arrives with a wound looking about the same. She has undermining laterally no exposed bone 08/27/2020 upon evaluation today patient appears to be doing decently well in regard to her foot ulcer all things considered. This is still quite significant wound. Her legs actually appear to be doing much better in fact I am not seeing much open at this point in that regard. Fortunately there is no signs of overall infection which is good she has completed her IV antibiotics. Electronic Signature(s) Signed: 08/27/2020 3:08:18 PM By: Lenda Kelp PA-C Entered By: Lenda Kelp on 08/27/2020 15:08:17 -------------------------------------------------------------------------------- Physical Exam Details Patient Name: Date of Service: OMARIA, PLUNK 08/27/2020 1:45 PM Medical Record Number: [id number] Patient Account Number: [id number] Date of Birth/Sex: Treating RN: Jul 23, 1935 (85 y.o. [name], [name] Primary Care Provider: [name] Other Clinician: Referring Provider: Treating Provider/Extender: Imogene Burn in Treatment: 5 Constitutional Well-nourished and well-hydrated in no acute distress. Respiratory normal breathing without difficulty. Psychiatric this patient is able to make decisions and demonstrates good insight into disease process. Alert and Oriented x 3. pleasant and cooperative. Notes Upon inspection patient's wound bed actually showed signs of good granulation at this time. There does not appear to be any evidence of infection which is great news overall very pleased with where things stand. I did perform sharp debridement to clear away some of the necrotic debris on the surface of  the wound patient tolerated that today without complication post debridement wound bed appears to be doing much better which is great news. Electronic Signature(s) Signed: 08/27/2020 3:09:01 PM By: Lenda Kelp PA-C Entered By: Lenda Kelp on 08/27/2020 15:09:01 -------------------------------------------------------------------------------- Physician Orders Details Patient Name: Date  of Service: Tara PoeULRICH, Kymiah L. 08/27/2020 1:45 PM Medical Record Number: [id number] Patient Account Number: [id number] Date of Birth/Sex: Treating RN: 04/20/1936 (85 y.o. Ardis RowanF) Breedlove, [name] Primary Care Provider: Anson FretAriza, Fernando Other Clinician: Referring Provider: Treating Provider/Extender: Imogene BurnStone III, Jeana Kersting Ariza, Fernando Weeks in Treatment: 5 Verbal / Phone Orders: No Diagnosis Coding ICD-10 Coding Code Description E11.621 Type 2 diabetes mellitus with foot ulcer E11.51 Type 2 diabetes mellitus with diabetic peripheral angiopathy without gangrene I87.333 Chronic venous hypertension (idiopathic) with ulcer and inflammation of bilateral lower extremity L97.523 Non-pressure chronic ulcer of other part of left foot with necrosis of muscle L97.811 Non-pressure chronic ulcer of other part of right lower leg limited to breakdown of skin L97.121 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 Follow-up Appointments Return Appointment in 2 weeks. Bathing/ Shower/ Hygiene May shower with protection but do not get wound dressing(s) wet. - May bed bathe, or use cast protectors in shower. You can shower on days that dressings get changed, just take dressings off first, shower, then dressings should get changed right after. Edema Control - Lymphedema / SCD / Other Elevate legs to the level of the heart or above for 30 minutes daily and/or when sitting, a frequency of: Avoid standing for long periods of time. Wound Treatment Wound #5 - Foot Wound Laterality: Plantar, Left Cleanser: Soap  and Water 3 x Per Week/15 Days Discharge Instructions: May shower and wash wound with dial antibacterial soap and water prior to dressing change. Cleanser: Wound Cleanser (Generic) 3 x Per Week/15 Days Discharge Instructions: Cleanse the wound with wound cleanser prior to applying a clean dressing using gauze sponges, not tissue or cotton balls. Peri-Wound Care: Triamcinolone 15 (g) 3 x Per Week/15 Days Discharge Instructions: Use triamcinolone 15 (g) as directed Peri-Wound Care: Sween Lotion (Moisturizing lotion) 3 x Per Week/15 Days Discharge Instructions: Apply moisturizing lotion as directed Prim Dressing: IODOFLEX 0.9% Cadexomer Iodine Pad 4x6 cm (Generic) 3 x Per Week/15 Days ary Discharge Instructions: Apply to wound bed as instructed Secondary Dressing: Woven Gauze Sponge, Non-Sterile 4x4 in (Generic) 3 x Per Week/15 Days Discharge Instructions: Apply over primary dressing as directed. Secondary Dressing: ABD Pad, 5x9 3 x Per Week/15 Days Discharge Instructions: Apply over primary dressing as directed. Compression Wrap: Kerlix Roll 4.5x3.1 (in/yd) (Generic) 3 x Per Week/15 Days Discharge Instructions: Apply Kerlix and Coban compression as directed. Compression Wrap: Coban Self-Adherent Wrap 4x5 (in/yd) (Generic) 3 x Per Week/15 Days Discharge Instructions: Apply over Kerlix as directed. Apply very light coban. Wound #7 - Lower Leg Wound Laterality: Left, Medial Cleanser: Soap and Water 3 x Per Week/15 Days Discharge Instructions: May shower and wash wound with dial antibacterial soap and water prior to dressing change. Cleanser: Wound Cleanser (Generic) 3 x Per Week/15 Days Discharge Instructions: Cleanse the wound with wound cleanser prior to applying a clean dressing using gauze sponges, not tissue or cotton balls. Peri-Wound Care: Triamcinolone 15 (g) 3 x Per Week/15 Days Discharge Instructions: Use triamcinolone 15 (g) as directed Peri-Wound Care: Sween Lotion (Moisturizing  lotion) 3 x Per Week/15 Days Discharge Instructions: Apply moisturizing lotion as directed Prim Dressing: IODOFLEX 0.9% Cadexomer Iodine Pad 4x6 cm (Generic) 3 x Per Week/15 Days ary Discharge Instructions: Apply to wound bed as instructed Secondary Dressing: Woven Gauze Sponge, Non-Sterile 4x4 in (Generic) 3 x Per Week/15 Days Discharge Instructions: Apply over primary dressing as directed. Secondary Dressing: ABD Pad, 5x9 3 x Per Week/15 Days Discharge Instructions: Apply over primary dressing as directed.  Compression Wrap: Kerlix Roll 4.5x3.1 (in/yd) (Generic) 3 x Per Week/15 Days Discharge Instructions: Apply Kerlix and Coban compression as directed. Compression Wrap: Coban Self-Adherent Wrap 4x5 (in/yd) (Generic) 3 x Per Week/15 Days Discharge Instructions: Apply over Kerlix as directed. Apply very light coban. Electronic Signature(s) Signed: 08/28/2020 8:28:17 AM By: Lenda Kelp PA-C Signed: 08/28/2020 5:36:09 PM By: Fonnie Mu RN Entered By: Fonnie Mu on 08/27/2020 15:00:04 -------------------------------------------------------------------------------- Problem List Details Patient Name: Date of Service: Tara Flores 08/27/2020 1:45 PM Medical Record Number: [id number] Patient Account Number: [id number] Date of Birth/Sex: Treating RN: Jul 18, 1935 (85 y.o. [name], [name] Primary Care Provider: Other Clinician: [name] Referring Provider: Treating Provider/Extender: Imogene Burn in Treatment: 5 Active Problems ICD-10 Encounter Code Description Active Date MDM Diagnosis E11.621 Type 2 diabetes mellitus with foot ulcer 07/23/2020 No Yes E11.51 Type 2 diabetes mellitus with diabetic peripheral angiopathy without gangrene 07/23/2020 No Yes I87.333 Chronic venous hypertension (idiopathic) with ulcer and inflammation of 07/23/2020 No Yes bilateral lower extremity L97.523 Non-pressure chronic ulcer of other part of left foot with  necrosis of muscle 07/23/2020 No Yes L97.811 Non-pressure chronic ulcer of other part of right lower leg limited to breakdown 07/23/2020 No Yes of skin L97.121 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 07/23/2020 No Yes Inactive Problems Resolved Problems Electronic Signature(s) Signed: 08/27/2020 2:27:09 PM By: Lenda Kelp PA-C Entered By: Lenda Kelp on 08/27/2020 14:27:08 -------------------------------------------------------------------------------- Progress Note Details Patient Name: Date of Service: Tara Flores 08/27/2020 1:45 PM Medical Record Number: [id number] Patient Account Number: [id number] Date of Birth/Sex: Treating RN: 31-Jul-1935 (85 y.o. [name], [name] Primary Care Provider: [name] Other Clinician: Referring Provider: Treating Provider/Extender: Imogene Burn in Treatment: 5 Subjective Chief Complaint Information obtained from Patient spontaneous wounds both legs. no pain. 2 courses of Keflex. complication of dm, legally blid 07/23/2020; patient is here for review of the left plantar foot wound and wounds on her bilateral anterior lower legs History of Present Illness (HPI) The following HPI elements were documented for the patient's wound: Location: both legs Quality: Patient reports No Pain. Severity: Patient states wound(s) are getting better. Modifying Factors: has had keflex dm with neuropathy and retinopathy. stasis changes surround the wounds. edema is minimal. feet are very dry and pulsesless. abi bilat around 0.7 READMISSION 07/23/2020 This is an 85 year old woman who comes from [facility] skilled facility where she is apparently been a resident for 3 years. She was here and saw Dr. Wiliam Ke I believe in 2016 with wounds on her bilateral legs. At some point she had a left transmetatarsal amputation but I do not really have any information on this. Furthermore I am not really certain what they  have been dressing her wounds with. She has a large area on the plantar left foot and smaller areas on the bilateral anterior lower legs. She was hospitalized from 06/25/2020 through 07/03/2020 discovered to have a left foot ulcer with underlying osteomyelitis of the left cuboid. She was either seen or consulted with ID and she is on IV Rocephin and vancomycin with the Rocephin finishing on February 14 and the vancomycin on the 21st. I am not really sure what follow-up she has. She was also revascularized during this hospitalization with an angiogram showing left SFA stenosis. She underwent stenting of the left SFA stenting of the PTA and left peroneal arteries. She refused an amputation on the left. I am not sure what they  are putting on any of these wounds although she does have a kerlix Coban wrap. She is a poorly controlled type II diabetic she also is a continued smoker Past medical history includes atrial fibrillation, chronic venous insufficiency, type 2 diabetes with most recent hemoglobin A1c of 9.6, chronic diastolic heart failure, peripheral vascular disease, left foot transmet apparently in April 2018. Her post revascularization ABIs were 0.74 on the right and 0.60 on the left. Waveforms were monophasic this was on 06/25/2020. T oday we obtained 0.57 on the right and 0.6 5 on the left. MRI on 1/10 showed osteomyelitis of the plantar aspect of the cuboid. No additional sites of acute osteomyelitis noted prior forefoot amputation and no abscess I have reviewed her angiogram which resulted in a laser atherectomy of the left SFA and left peroneal artery. Angioplasty of the left SFA and angioplasty of the left peroneal I do not see mentions of stents however. 08/06/2018 2 seconds; second visit in the clinic for this woman with a substantial wound on the left plantar foot just below her left TMA. She was hospitalized for this and underwent a substantial revascularization. MRI suggested osteomyelitis.  By my notes from last time the antibiotic should have finished although apparently she is still getting antibiotics and still has her PICC line. I cannot see looking through [place] link that she foot has a follow-up with vascular or ID. We have been using Iodoflex on this area to hopefully get to a better surface she arrives with a wound looking about the same. She has undermining laterally no exposed bone 08/27/2020 upon evaluation today patient appears to be doing decently well in regard to her foot ulcer all things considered. This is still quite significant wound. Her legs actually appear to be doing much better in fact I am not seeing much open at this point in that regard. Fortunately there is no signs of overall infection which is good she has completed her IV antibiotics. Objective Constitutional Well-nourished and well-hydrated in no acute distress. Vitals Time Taken: 2:22 PM, Height: 61 in, Weight: 157 lbs, BMI: 29.7, Temperature: 97.6 F, Pulse: 108 bpm, Respiratory Rate: 18 breaths/min, Blood Pressure: 107/61 mmHg. Respiratory normal breathing without difficulty. Psychiatric this patient is able to make decisions and demonstrates good insight into disease process. Alert and Oriented x 3. pleasant and cooperative. General Notes: Upon inspection patient's wound bed actually showed signs of good granulation at this time. There does not appear to be any evidence of infection which is great news overall very pleased with where things stand. I did perform sharp debridement to clear away some of the necrotic debris on the surface of the wound patient tolerated that today without complication post debridement wound bed appears to be doing much better which is great news. Integumentary (Hair, Skin) Wound #10 status is Healed - Epithelialized. Original cause of wound was Gradually Appeared. The date acquired was: 08/06/2020. The wound has been in treatment 3 weeks. The wound is located on  the Right,Proximal,Lateral Lower Leg. The wound measures 0cm length x 0cm width x 0cm depth; 0cm^2 area and 0cm^3 volume. Wound #5 status is Open. Original cause of wound was Gradually Appeared. The date acquired was: 04/22/2020. The wound has been in treatment 5 weeks. The wound is located on the Left,Plantar Foot. The wound measures 3.9cm length x 5cm width x 0.6cm depth; 15.315cm^2 area and 9.189cm^3 volume. There is Fat Layer (Subcutaneous Tissue) exposed. There is no tunneling or undermining noted. There is a  large amount of serosanguineous drainage noted. The wound margin is epibole. There is medium (34-66%) pink, pale granulation within the wound bed. There is a medium (34-66%) amount of necrotic tissue within the wound bed including Adherent Slough. Wound #7 status is Open. Original cause of wound was Gradually Appeared. The date acquired was: 06/22/2020. The wound has been in treatment 5 weeks. The wound is located on the Left,Medial Lower Leg. The wound measures 0cm length x 0cm width x 0cm depth; 0cm^2 area and 0cm^3 volume. Wound #8 status is Healed - Epithelialized. Original cause of wound was Gradually Appeared. The date acquired was: 06/22/2020. The wound has been in treatment 5 weeks. The wound is located on the Right,Lateral Lower Leg. The wound measures 0cm length x 0cm width x 0cm depth; 0cm^2 area and 0cm^3 volume. Wound #9 status is Healed - Epithelialized. Original cause of wound was Gradually Appeared. The date acquired was: 08/06/2020. The wound has been in treatment 3 weeks. The wound is located on the Left,Proximal,Anterior Lower Leg. The wound measures 0cm length x 0cm width x 0cm depth; 0cm^2 area and 0cm^3 volume. Assessment Active Problems ICD-10 Type 2 diabetes mellitus with foot ulcer Type 2 diabetes mellitus with diabetic peripheral angiopathy without gangrene Chronic venous hypertension (idiopathic) with ulcer and inflammation of bilateral lower extremity Non-pressure  chronic ulcer of other part of left foot with necrosis of muscle Non-pressure chronic ulcer of other part of right lower leg limited to breakdown of skin 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 Procedures Wound #5 Pre-procedure diagnosis of Wound #5 is a Diabetic Wound/Ulcer of the Lower Extremity located on the Left,Plantar Foot .Severity of Tissue Pre Debridement is: Fat layer exposed. There was a Excisional Skin/Subcutaneous Tissue Debridement with a total area of 19.5 sq cm performed by Lenda Kelp, PA. With the following instrument(s): Curette to remove Viable and Non-Viable tissue/material. Material removed includes Subcutaneous Tissue, Slough, and Skin: Dermis after achieving pain control using Other Elisabeth Most). No specimens were taken. A time out was conducted at 15:00, prior to the start of the procedure. A Minimum amount of bleeding was controlled with Pressure. The procedure was tolerated well with a pain level of 0 throughout and a pain level of 0 following the procedure. Post Debridement Measurements: 3.9cm length x 5cm width x 0.6cm depth; 9.189cm^3 volume. Character of Wound/Ulcer Post Debridement is improved. Severity of Tissue Post Debridement is: Fat layer exposed. Post procedure Diagnosis Wound #5: Same as Pre-Procedure Plan Follow-up Appointments: Return Appointment in 2 weeks. Bathing/ Shower/ Hygiene: May shower with protection but do not get wound dressing(s) wet. - May bed bathe, or use cast protectors in shower. You can shower on days that dressings get changed, just take dressings off first, shower, then dressings should get changed right after. Edema Control - Lymphedema / SCD / Other: Elevate legs to the level of the heart or above for 30 minutes daily and/or when sitting, a frequency of: Avoid standing for long periods of time. WOUND #5: - Foot Wound Laterality: Plantar, Left Cleanser: Soap and Water 3 x Per Week/15 Days Discharge  Instructions: May shower and wash wound with dial antibacterial soap and water prior to dressing change. Cleanser: Wound Cleanser (Generic) 3 x Per Week/15 Days Discharge Instructions: Cleanse the wound with wound cleanser prior to applying a clean dressing using gauze sponges, not tissue or cotton balls. Peri-Wound Care: Triamcinolone 15 (g) 3 x Per Week/15 Days Discharge Instructions: Use triamcinolone 15 (g) as  directed Peri-Wound Care: Sween Lotion (Moisturizing lotion) 3 x Per Week/15 Days Discharge Instructions: Apply moisturizing lotion as directed Prim Dressing: IODOFLEX 0.9% Cadexomer Iodine Pad 4x6 cm (Generic) 3 x Per Week/15 Days ary Discharge Instructions: Apply to wound bed as instructed Secondary Dressing: Woven Gauze Sponge, Non-Sterile 4x4 in (Generic) 3 x Per Week/15 Days Discharge Instructions: Apply over primary dressing as directed. Secondary Dressing: ABD Pad, 5x9 3 x Per Week/15 Days Discharge Instructions: Apply over primary dressing as directed. Com pression Wrap: Kerlix Roll 4.5x3.1 (in/yd) (Generic) 3 x Per Week/15 Days Discharge Instructions: Apply Kerlix and Coban compression as directed. Com pression Wrap: Coban Self-Adherent Wrap 4x5 (in/yd) (Generic) 3 x Per Week/15 Days Discharge Instructions: Apply over Kerlix as directed. Apply very light coban. WOUND #7: - Lower Leg Wound Laterality: Left, Medial Cleanser: Soap and Water 3 x Per Week/15 Days Discharge Instructions: May shower and wash wound with dial antibacterial soap and water prior to dressing change. Cleanser: Wound Cleanser (Generic) 3 x Per Week/15 Days Discharge Instructions: Cleanse the wound with wound cleanser prior to applying a clean dressing using gauze sponges, not tissue or cotton balls. Peri-Wound Care: Triamcinolone 15 (g) 3 x Per Week/15 Days Discharge Instructions: Use triamcinolone 15 (g) as directed Peri-Wound Care: Sween Lotion (Moisturizing lotion) 3 x Per Week/15 Days Discharge  Instructions: Apply moisturizing lotion as directed Prim Dressing: IODOFLEX 0.9% Cadexomer Iodine Pad 4x6 cm (Generic) 3 x Per Week/15 Days ary Discharge Instructions: Apply to wound bed as instructed Secondary Dressing: Woven Gauze Sponge, Non-Sterile 4x4 in (Generic) 3 x Per Week/15 Days Discharge Instructions: Apply over primary dressing as directed. Secondary Dressing: ABD Pad, 5x9 3 x Per Week/15 Days Discharge Instructions: Apply over primary dressing as directed. Com pression Wrap: Kerlix Roll 4.5x3.1 (in/yd) (Generic) 3 x Per Week/15 Days Discharge Instructions: Apply Kerlix and Coban compression as directed. Com pression Wrap: Coban Self-Adherent Wrap 4x5 (in/yd) (Generic) 3 x Per Week/15 Days Discharge Instructions: Apply over Kerlix as directed. Apply very light coban. 1. I would recommend currently that we go ahead and continue with the Iodoflex I feel like that is doing a good job helping to clean up the surface of the wound I was able to get a lot of the surface slough and biofilm off the surface of the wound which also I think will be beneficial that is great news. 2. I am also can recommend at this point that we have the patient go ahead and continue with the Curlex and Coban wrap as well which I think has been beneficial to keep some of the edema under control. 3. I am also can recommend the patient avoid pivoting and putting pressure on her foot is much as possible to try to protect this. Obviously like she can do the better in my opinion. We will see patient back for reevaluation in 1 week here in the clinic. If anything worsens or changes patient will contact our office for additional recommendations. Electronic Signature(s) Signed: 08/27/2020 3:09:51 PM By: Lenda Kelp PA-C Entered By: Lenda Kelp on 08/27/2020 15:09:51 -------------------------------------------------------------------------------- SuperBill Details Patient Name: Date of Service: Tara Flores  08/27/2020 Medical Record Number: [id number] Patient Account Number: [id number] Date of Birth/Sex: Treating RN: [birth year] (85 y.o. [name], [name] Primary Care Provider: [name] Other Clinician: Referring Provider: Treating Provider/Extender: Imogene Burn in Treatment: 5 Diagnosis Coding ICD-10 Codes Code Description [phone] Type 2 diabetes mellitus with foot ulcer E11.51 Type 2 diabetes mellitus with diabetic  peripheral angiopathy without gangrene I87.333 Chronic venous hypertension (idiopathic) with ulcer and inflammation of bilateral lower extremity L97.523 Non-pressure chronic ulcer of other part of left foot with necrosis of muscle L97.811 Non-pressure chronic ulcer of other part of right lower leg limited to breakdown of skin L97.121 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 Facility Procedures CPT4 Code: [id number] Description: 11042 - DEB SUBQ TISSUE 20 SQ CM/< ICD-10 Diagnosis Description L97.523 Non-pressure chronic ulcer of other part of left foot with necrosis of muscle Modifier: Quantity: 1 Physician Procedures : CPT4 Code Description Modifier [id number] 11042 - WC PHYS SUBQ TISS 20 SQ CM ICD-10 Diagnosis Description L97.523 Non-pressure chronic ulcer of other part of left foot with necrosis of muscle Quantity: 1 Electronic Signature(s) Signed: 08/27/2020 3:11:45 PM By: Lenda Kelp PA-C Entered By: Lenda Kelp on 08/27/2020 15:11:44

## 2020-08-28 NOTE — Progress Notes (Addendum)
Tara, Flores ([id number]) Visit Report for 08/27/2020 Arrival Information Details Patient Name: Date of Service: Tara Flores, Tara Flores 08/27/2020 1:45 PM Medical Record Number: [id number] Patient Account Number: [id number] Date of Birth/Sex: Treating RN: April 08, 1936 (85 y.o. [name], [name] Primary Care Morrison Mcbryar: [name] Other Clinician: Referring Jon Lall: Treating [name]/Extender: Imogene Burn in Treatment: 5 Visit Information History Since Last Visit Added or deleted any medications: No Patient Arrived: Wheel Chair Any new allergies or adverse reactions: No Arrival Time: 14:21 Had a fall or experienced change in No Accompanied By: Facility Staff activities of daily living that may affect Transfer Assistance: None risk of falls: Patient Identification Verified: Yes Signs or symptoms of abuse/neglect since last visito No Secondary Verification Process Completed: Yes Hospitalized since last visit: No Patient Requires Transmission-Based Precautions: No Implantable device outside of the clinic excluding No Patient Has Alerts: Yes cellular tissue based products placed in the center Patient Alerts: R ABI = .74, L ABI= .60 since last visit: Has Dressing in Place as Prescribed: Yes Has Compression in Place as Prescribed: Yes Pain Present Now: No Electronic Signature(s) Signed: 08/27/2020 5:00:20 PM By: Antonieta Iba Entered By: Antonieta Iba on 08/27/2020 14:22:49 -------------------------------------------------------------------------------- Encounter Discharge Information Details Patient Name: Date of Service: Tara Flores 08/27/2020 1:45 PM Medical Record Number: [id number] Patient Account Number: [id number] Date of Birth/Sex: Treating RN: 1936-05-21 (85 y.o. [name], [name] Primary Care [name]: [name] Other Clinician: Referring [name]: Treating [name]/Extender: Imogene Burn in Treatment: 5 Encounter  Discharge Information Items Post Procedure Vitals Discharge Condition: Stable Temperature (F): 97.6 Ambulatory Status: Wheelchair Pulse (bpm): 108 Discharge Destination: Skilled Nursing Facility Respiratory Rate (breaths/min): 18 Telephoned: No Blood Pressure (mmHg): 107/61 Orders Sent: Yes Transportation: Private Auto Accompanied By: caregiver Schedule Follow-up Appointment: Yes Clinical Summary of Care: Electronic Signature(s) Signed: 08/27/2020 5:55:08 PM By: Shawn Stall Entered By: Shawn Stall on 08/27/2020 15:48:26 -------------------------------------------------------------------------------- Lower Extremity Assessment Details Patient Name: Date of Service: Tara Flores, Tara Flores 08/27/2020 1:45 PM Medical Record Number: [id number] Patient Account Number: [id number] Date of Birth/Sex: Treating RN: February 04, 1936 (85 y.o. [name], [name] Primary Care Chayce Rullo: [name] Other Clinician: Referring [name]: Treating [name]/Extender: Wonda Cheng Weeks in Treatment: 5 Edema Assessment Assessed: [name]: Yes] [name]: Yes] Edema: [Left: Yes] [Right: Yes] Calf Left: Right: Point of Measurement: 28 cm From Medial Instep 36 cm 34.5 cm Ankle Left: Right: Point of Measurement: 9 cm From Medial Instep 20.8 cm 20.6 cm Vascular Assessment Pulses: Dorsalis Pedis Palpable: [Left:No] [Right:No] Electronic Signature(s) Signed: 08/27/2020 5:00:20 PM By: Antonieta Iba Signed: 08/28/2020 5:36:09 PM By: Fonnie Mu RN Entered By: Antonieta Iba on 08/27/2020 14:27:07 -------------------------------------------------------------------------------- Multi-Disciplinary Care Plan Details Patient Name: Date of Service: Tara Flores 08/27/2020 1:45 PM Medical Record Number: [id number] Patient Account Number: [id number] Date of Birth/Sex: Treating RN: [birth year] (85 y.o. [name], [name] Primary Care Shermon Bozzi: [name] Other Clinician: Referring  Dorrie Cocuzza: Treating [name]/Extender: Imogene Burn in Treatment: 5 Active Inactive Electronic Signature(s) Signed: 10/25/2020 3:32:18 PM By: Fonnie Mu RN Signed: 11/07/2020 5:24:09 PM By: Zenaida Deed RN, BSN Previous Signature: 08/28/2020 5:36:09 PM Version By: Fonnie Mu RN Entered By: Zenaida Deed on 09/09/2020 15:58:07 -------------------------------------------------------------------------------- Pain Assessment Details Patient Name: Date of Service: Tara Flores 08/27/2020 1:45 PM Medical Record Number: [id number] Patient Account Number: [id number] Date of Birth/Sex: Treating RN: 01-03-1936 (85 y.o. [name], [name] Primary Care Tanielle Emigh: [name] Other Clinician: Referring Milton Streicher: Treating [name]/Extender: Lenda Kelp  [name] Weeks in Treatment: 5 Active Problems Location of Pain Severity and Description of Pain Patient Has Paino No Site Locations Pain Management and Medication Current Pain Management: Electronic Signature(s) Signed: 08/27/2020 5:00:20 PM By: Antonieta Iba Signed: 08/28/2020 5:36:09 PM By: Fonnie Mu RN Entered By: Antonieta Iba on 08/27/2020 14:23:36 -------------------------------------------------------------------------------- Patient/Caregiver Education Details Patient Name: Date of Service: Tara Flores 3/8/2022andnbsp1:45 PM Medical Record Number: [id number] Patient Account Number: [id number] Date of Birth/Gender: Treating RN: Nov 12, 1935 (85 y.o. [name], [name] Primary Care Physician: [name] Other Clinician: Referring Physician: Treating Physician/Extender: Imogene Burn in Treatment: 5 Education Assessment Education Provided To: Patient Education Topics Provided Wound/Skin Impairment: Methods: Explain/Verbal Responses: State content correctly Electronic Signature(s) Signed: 08/28/2020 5:36:09 PM By: Fonnie Mu  RN Entered By: Fonnie Mu on 08/27/2020 14:53:06 -------------------------------------------------------------------------------- Wound Assessment Details Patient Name: Date of Service: Tara Flores 08/27/2020 1:45 PM Medical Record Number: [id number] Patient Account Number: [id number] Date of Birth/Sex: Treating RN: April 16, 1936 (85 y.o. [name], [name] Primary Care Tereza Gilham: [name] Other Clinician: Referring Espyn Radwan: Treating Kong Packett/Extender: Imogene Burn in Treatment: 5 Wound Status Wound Number: 10 Primary Etiology: Lymphedema Wound Location: Right, Proximal, Lateral Lower Leg Wound Status: Healed - Epithelialized Wounding Event: Gradually Appeared Date Acquired: 08/06/2020 Weeks Of Treatment: 3 Clustered Wound: No Wound Measurements Length: (cm) Width: (cm) Depth: (cm) Area: (cm) Volume: (cm) 0 % Reduction in Area: 100% 0 % Reduction in Volume: 100% 0 0 0 Wound Description Classification: Full Thickness Without Exposed Support Structur es Treatment Notes Wound #10 (Lower Leg) Wound Laterality: Right, Lateral, Proximal Cleanser Peri-Wound Care Topical Primary Dressing Secondary Dressing Secured With Compression Wrap Compression Stockings Add-Ons Electronic Signature(s) Signed: 08/27/2020 5:00:20 PM By: Antonieta Iba Signed: 08/28/2020 5:36:09 PM By: Fonnie Mu RN Entered By: Antonieta Iba on 08/27/2020 14:31:56 -------------------------------------------------------------------------------- Wound Assessment Details Patient Name: Date of Service: Tara Flores 08/27/2020 1:45 PM Medical Record Number: [id number] Patient Account Number: [id number] Date of Birth/Sex: Treating RN: 1935-12-14 (85 y.o. [name], [name] Primary Care Dotsie Gillette: [name] Other Clinician: Referring Nykole Matos: Treating Janvi Ammar/Extender: Imogene Burn in Treatment: 5 Wound Status Wound  Number: 5 Primary Diabetic Wound/Ulcer of the Lower Extremity Etiology: Wound Location: Left, Plantar Foot Wound Open Wounding Event: Gradually Appeared Status: Date Acquired: 04/22/2020 Comorbid Cataracts, Chronic sinus problems/congestion, Anemia, Asthma, Weeks Of Treatment: 5 History: Chronic Obstructive Pulmonary Disease (COPD), Arrhythmia, Clustered Wound: No Congestive Heart Failure, Coronary Artery Disease, Hypertension, Peripheral Arterial Disease, Colitis, Type II Diabetes, Osteoarthritis, Osteomyelitis, Neuropathy, Confinement Anxiety Photos Wound Measurements Length: (cm) 3.9 Width: (cm) 5 Depth: (cm) 0.6 Area: (cm) 15.315 Volume: (cm) 9.189 % Reduction in Area: 15.9% % Reduction in Volume: 37% Epithelialization: Small (1-33%) Tunneling: No Undermining: No Wound Description Classification: Grade 3 Wound Margin: Epibole Exudate Amount: Large Exudate Type: Serosanguineous Exudate Color: red, brown Foul Odor After Cleansing: No Slough/Fibrino Yes Wound Bed Granulation Amount: Medium (34-66%) Exposed Structure Granulation Quality: Pink, Pale Fascia Exposed: No Necrotic Amount: Medium (34-66%) Fat Layer (Subcutaneous Tissue) Exposed: Yes Necrotic Quality: Adherent Slough Tendon Exposed: No Muscle Exposed: No Joint Exposed: No Bone Exposed: No Electronic Signature(s) Signed: 08/29/2020 5:24:28 PM By: Fonnie Mu RN Signed: 08/30/2020 10:32:25 AM By: Karl Ito Previous Signature: 08/27/2020 5:00:20 PM Version By: Antonieta Iba Previous Signature: 08/28/2020 5:36:09 PM Version By: Fonnie Mu RN Entered By: Karl Ito on 08/29/2020 11:15:18 -------------------------------------------------------------------------------- Wound Assessment Details Patient Name: Date of Service: Tara Flores 08/27/2020 1:45 PM Medical Record  Number: [id number] Patient Account Number: [id number] Date of Birth/Sex: Treating RN: 08/09/1935 (85 y.o. Ardis RowanF)  Breedlove, [name] Primary Care Alizon Schmeling: Anson FretAriza, Fernando Other Clinician: Referring Carine Nordgren: Treating Heidi Maclin/Extender: Imogene BurnStone III, Hoyt Ariza, Fernando Weeks in Treatment: 5 Wound Status Wound Number: 7 Primary Venous Leg Ulcer Etiology: Wound Location: Left, Medial Lower Leg Secondary Diabetic Wound/Ulcer of the Lower Extremity Wounding Event: Gradually Appeared Etiology: Date Acquired: 06/22/2020 Wound Open Weeks Of Treatment: 5 Status: Clustered Wound: No Comorbid Cataracts, Chronic sinus problems/congestion, Anemia, Asthma, History: Chronic Obstructive Pulmonary Disease (COPD), Arrhythmia, Congestive Heart Failure, Coronary Artery Disease, Hypertension, Peripheral Arterial Disease, Colitis, Type II Diabetes, Osteoarthritis, Osteomyelitis, Neuropathy, Confinement Anxiety Wound Measurements Length: (cm) Width: (cm) Depth: (cm) Area: (cm) Volume: (cm) 0 % Reduction in Area: 100% 0 % Reduction in Volume: 100% 0 0 0 Wound Description Classification: Full Thickness Without Exposed Support Structur es Electronic Signature(s) Signed: 08/27/2020 5:00:20 PM By: Antonieta IbaBarnhart, Jodi Signed: 08/28/2020 5:36:09 PM By: Fonnie MuBreedlove, [name] RN Entered By: Antonieta IbaBarnhart, Jodi on 08/27/2020 14:30:44 -------------------------------------------------------------------------------- Wound Assessment Details Patient Name: Date of Service: Tara PoeULRICH, Tara L. 08/27/2020 1:45 PM Medical Record Number: [id number] Patient Account Number: [id number] Date of Birth/Sex: Treating RN: 11/18/1935 (85 y.o. Ardis RowanF) Breedlove, [name] Primary Care Roshard Rezabek: Anson FretAriza, Fernando Other Clinician: Referring Ange Puskas: Treating Jessie Cowher/Extender: Imogene BurnStone III, Hoyt Ariza, Fernando Weeks in Treatment: 5 Wound Status Wound Number: 8 Primary Venous Leg Ulcer Etiology: Wound Location: Right, Lateral Lower Leg Secondary Diabetic Wound/Ulcer of the Lower Extremity Wounding Event: Gradually Appeared Etiology: Date Acquired:  06/22/2020 Wound Healed - Epithelialized Weeks Of Treatment: 5 Status: Clustered Wound: No Comorbid Cataracts, Chronic sinus problems/congestion, Anemia, Asthma, History: Chronic Obstructive Pulmonary Disease (COPD), Arrhythmia, Congestive Heart Failure, Coronary Artery Disease, Hypertension, Peripheral Arterial Disease, Colitis, Type II Diabetes, Osteoarthritis, Osteomyelitis, Neuropathy, Confinement Anxiety Wound Measurements Length: (cm) Width: (cm) Depth: (cm) Area: (cm) Volume: (cm) 0 % Reduction in Area: 100% 0 % Reduction in Volume: 100% 0 0 0 Wound Description Classification: Full Thickness Without Exposed Support Structur Electronic Signature(s) Signed: 08/27/2020 5:00:20 PM By: Antonieta IbaBarnhart, Jodi Signed: 08/28/2020 5:36:09 PM By: Fonnie MuBreedlove, [name] RN Entered By: Tamsen RoersBa es rnhart, Jodi on 08/27/2020 14:31:02 -------------------------------------------------------------------------------- Wound Assessment Details Patient Name: Date of Service: Tara PoeULRICH, Tara L. 08/27/2020 1:45 PM Medical Record Number: [id number] Patient Account Number: [id number] Date of Birth/Sex: Treating RN: 05/13/1936 (85 y.o. Ardis RowanF) Breedlove, [name] Primary Care Dhanya Bogle: Anson FretAriza, Fernando Other Clinician: Referring Rawson Minix: Treating Tujuana Kilmartin/Extender: Imogene BurnStone III, Hoyt Ariza, Fernando Weeks in Treatment: 5 Wound Status Wound Number: 9 Primary Lymphedema Etiology: Wound Location: Left, Proximal, Anterior Lower Leg Wound Healed - Epithelialized Wounding Event: Gradually Appeared Status: Date Acquired: 08/06/2020 Comorbid Cataracts, Chronic sinus problems/congestion, Anemia, Asthma, Weeks Of Treatment: 3 History: Chronic Obstructive Pulmonary Disease (COPD), Arrhythmia, Clustered Wound: No Congestive Heart Failure, Coronary Artery Disease, Hypertension, Peripheral Arterial Disease, Colitis, Type II Diabetes, Osteoarthritis, Osteomyelitis, Neuropathy, Confinement Anxiety Wound Measurements Length:  (cm) Width: (cm) Depth: (cm) Area: (cm) Volume: (cm) 0 % Reduction in Area: 100% 0 % Reduction in Volume: 100% 0 0 0 Wound Description Classification: Full Thickness Without Exposed Support Structur es Electronic Signature(s) Signed: 08/27/2020 5:00:20 PM By: Antonieta IbaBarnhart, Jodi Signed: 08/28/2020 5:36:09 PM By: Fonnie MuBreedlove, [name] RN Entered By: Antonieta IbaBarnhart, Jodi on 08/27/2020 14:31:29 -------------------------------------------------------------------------------- Vitals Details Patient Name: Date of Service: Tara PoeULRICH, Tara L. 08/27/2020 1:45 PM Medical Record Number: [id number] Patient Account Number: [id number] Date of Birth/Sex: Treating RN: 07/24/1935 (85 y.o. Ardis RowanF) Breedlove, [name] Primary Care Briannia Laba: Anson FretAriza, Fernando Other Clinician: Referring Garvin Ellena: Treating Shannen Vernon/Extender: Imogene BurnStone III, Hoyt Ariza, Fernando Weeks in  Treatment: 5 Vital Signs Time Taken: 14:22 Temperature (F): 97.6 Height (in): 61 Pulse (bpm): 108 Weight (lbs): 157 Respiratory Rate (breaths/min): 18 Body Mass Index (BMI): 29.7 Blood Pressure (mmHg): 107/61 Reference Range: 80 - 120 mg / dl Electronic Signature(s) Signed: 08/27/2020 5:00:20 PM By: Antonieta Iba Entered By: Antonieta Iba on 08/27/2020 14:23:20

## 2020-09-03 ENCOUNTER — Observation Stay (HOSPITAL_COMMUNITY)
Admission: EM | Admit: 2020-09-03 | Discharge: 2020-09-04 | Disposition: A | Discharge status: Home / Self Care | Payer: Medicare Other | Attending: Family Medicine | Admitting: Family Medicine

## 2020-09-03 ENCOUNTER — Other Ambulatory Visit: Payer: Self-pay

## 2020-09-03 DIAGNOSIS — E440 Moderate protein-calorie malnutrition: Secondary | ICD-10-CM

## 2020-09-03 DIAGNOSIS — Z20822 Contact with and (suspected) exposure to covid-19: Secondary | ICD-10-CM | POA: Diagnosis not present

## 2020-09-03 DIAGNOSIS — E1165 Type 2 diabetes mellitus with hyperglycemia: Secondary | ICD-10-CM | POA: Diagnosis present

## 2020-09-03 DIAGNOSIS — Z7901 Long term (current) use of anticoagulants: Secondary | ICD-10-CM | POA: Insufficient documentation

## 2020-09-03 DIAGNOSIS — Z720 Tobacco use: Secondary | ICD-10-CM | POA: Diagnosis present

## 2020-09-03 DIAGNOSIS — Z794 Long term (current) use of insulin: Secondary | ICD-10-CM | POA: Diagnosis not present

## 2020-09-03 DIAGNOSIS — Z7982 Long term (current) use of aspirin: Secondary | ICD-10-CM | POA: Diagnosis not present

## 2020-09-03 DIAGNOSIS — R4182 Altered mental status, unspecified: Secondary | ICD-10-CM | POA: Diagnosis present

## 2020-09-03 DIAGNOSIS — E119 Type 2 diabetes mellitus without complications: Secondary | ICD-10-CM

## 2020-09-03 DIAGNOSIS — E039 Hypothyroidism, unspecified: Secondary | ICD-10-CM | POA: Diagnosis present

## 2020-09-03 DIAGNOSIS — Z8673 Personal history of transient ischemic attack (TIA), and cerebral infarction without residual deficits: Secondary | ICD-10-CM | POA: Diagnosis not present

## 2020-09-03 DIAGNOSIS — N179 Acute kidney failure, unspecified: Secondary | ICD-10-CM

## 2020-09-03 DIAGNOSIS — Z7984 Long term (current) use of oral hypoglycemic drugs: Secondary | ICD-10-CM | POA: Diagnosis not present

## 2020-09-03 DIAGNOSIS — N189 Chronic kidney disease, unspecified: Secondary | ICD-10-CM | POA: Insufficient documentation

## 2020-09-03 DIAGNOSIS — Z96641 Presence of right artificial hip joint: Secondary | ICD-10-CM | POA: Insufficient documentation

## 2020-09-03 DIAGNOSIS — J441 Chronic obstructive pulmonary disease with (acute) exacerbation: Secondary | ICD-10-CM | POA: Diagnosis not present

## 2020-09-03 DIAGNOSIS — I2510 Atherosclerotic heart disease of native coronary artery without angina pectoris: Secondary | ICD-10-CM | POA: Diagnosis not present

## 2020-09-03 DIAGNOSIS — I509 Heart failure, unspecified: Secondary | ICD-10-CM | POA: Diagnosis not present

## 2020-09-03 DIAGNOSIS — D649 Anemia, unspecified: Secondary | ICD-10-CM

## 2020-09-03 DIAGNOSIS — I130 Hypertensive heart and chronic kidney disease with heart failure and stage 1 through stage 4 chronic kidney disease, or unspecified chronic kidney disease: Secondary | ICD-10-CM | POA: Insufficient documentation

## 2020-09-03 DIAGNOSIS — J45909 Unspecified asthma, uncomplicated: Secondary | ICD-10-CM | POA: Diagnosis not present

## 2020-09-03 DIAGNOSIS — K921 Melena: Secondary | ICD-10-CM | POA: Diagnosis not present

## 2020-09-03 DIAGNOSIS — K922 Gastrointestinal hemorrhage, unspecified: Secondary | ICD-10-CM | POA: Diagnosis not present

## 2020-09-03 DIAGNOSIS — D631 Anemia in chronic kidney disease: Secondary | ICD-10-CM | POA: Diagnosis not present

## 2020-09-03 DIAGNOSIS — I4891 Unspecified atrial fibrillation: Secondary | ICD-10-CM | POA: Diagnosis present

## 2020-09-03 DIAGNOSIS — E11649 Type 2 diabetes mellitus with hypoglycemia without coma: Secondary | ICD-10-CM

## 2020-09-03 DIAGNOSIS — R571 Hypovolemic shock: Secondary | ICD-10-CM | POA: Diagnosis not present

## 2020-09-03 DIAGNOSIS — Z79899 Other long term (current) drug therapy: Secondary | ICD-10-CM | POA: Insufficient documentation

## 2020-09-03 DIAGNOSIS — F17210 Nicotine dependence, cigarettes, uncomplicated: Secondary | ICD-10-CM | POA: Diagnosis not present

## 2020-09-03 DIAGNOSIS — I10 Essential (primary) hypertension: Secondary | ICD-10-CM | POA: Diagnosis present

## 2020-09-03 LAB — CBC WITH DIFFERENTIAL/PLATELET
Abs Immature Granulocytes: 0.07 10*3/uL (ref 0.00–0.07)
Basophils Absolute: 0.1 10*3/uL (ref 0.0–0.1)
Basophils Relative: 1 %
Eosinophils Absolute: 0.1 10*3/uL (ref 0.0–0.5)
Eosinophils Relative: 1 %
HCT: 17.9 % — ABNORMAL LOW (ref 36.0–46.0)
Hemoglobin: 5.5 g/dL — CL (ref 12.0–15.0)
Immature Granulocytes: 1 %
Lymphocytes Relative: 10 %
Lymphs Abs: 1.1 10*3/uL (ref 0.7–4.0)
MCH: 29.4 pg (ref 26.0–34.0)
MCHC: 30.7 g/dL (ref 30.0–36.0)
MCV: 95.7 fL (ref 80.0–100.0)
Monocytes Absolute: 0.8 10*3/uL (ref 0.1–1.0)
Monocytes Relative: 8 %
Neutro Abs: 8.5 10*3/uL — ABNORMAL HIGH (ref 1.7–7.7)
Neutrophils Relative %: 79 %
Platelets: 331 10*3/uL (ref 150–400)
RBC: 1.87 MIL/uL — ABNORMAL LOW (ref 3.87–5.11)
RDW: 15.7 % — ABNORMAL HIGH (ref 11.5–15.5)
WBC: 10.6 10*3/uL — ABNORMAL HIGH (ref 4.0–10.5)
nRBC: 0.3 % — ABNORMAL HIGH (ref 0.0–0.2)

## 2020-09-03 LAB — RESP PANEL BY RT-PCR (FLU A&B, COVID) ARPGX2
Influenza A by PCR: NEGATIVE
Influenza B by PCR: NEGATIVE
SARS Coronavirus 2 by RT PCR: NEGATIVE

## 2020-09-03 LAB — CBC
HCT: 20.9 % — ABNORMAL LOW (ref 36.0–46.0)
Hemoglobin: 6.6 g/dL — CL (ref 12.0–15.0)
MCH: 28.1 pg (ref 26.0–34.0)
MCHC: 31.6 g/dL (ref 30.0–36.0)
MCV: 88.9 fL (ref 80.0–100.0)
Platelets: 308 10*3/uL (ref 150–400)
RBC: 2.35 MIL/uL — ABNORMAL LOW (ref 3.87–5.11)
RDW: 18.5 % — ABNORMAL HIGH (ref 11.5–15.5)
WBC: 8.5 10*3/uL (ref 4.0–10.5)
nRBC: 0.5 % — ABNORMAL HIGH (ref 0.0–0.2)

## 2020-09-03 LAB — COMPREHENSIVE METABOLIC PANEL
ALT: 16 U/L (ref 0–44)
AST: 25 U/L (ref 15–41)
Albumin: 2.8 g/dL — ABNORMAL LOW (ref 3.5–5.0)
Alkaline Phosphatase: 73 U/L (ref 38–126)
Anion gap: 13 (ref 5–15)
BUN: 74 mg/dL — ABNORMAL HIGH (ref 8–23)
CO2: 22 mmol/L (ref 22–32)
Calcium: 9.1 mg/dL (ref 8.9–10.3)
Chloride: 95 mmol/L — ABNORMAL LOW (ref 98–111)
Creatinine, Ser: 1.28 mg/dL — ABNORMAL HIGH (ref 0.44–1.00)
GFR, Estimated: 42 mL/min — ABNORMAL LOW (ref >60)
Glucose, Bld: 281 mg/dL — ABNORMAL HIGH (ref 70–99)
Potassium: 3.5 mmol/L (ref 3.5–5.1)
Sodium: 130 mmol/L — ABNORMAL LOW (ref 135–145)
Total Bilirubin: 0.5 mg/dL (ref 0.3–1.2)
Total Protein: 6.8 g/dL (ref 6.5–8.1)

## 2020-09-03 LAB — PREPARE RBC (CROSSMATCH)

## 2020-09-03 LAB — POC OCCULT BLOOD, ED: Fecal Occult Bld: POSITIVE — AB

## 2020-09-03 LAB — LIPASE, BLOOD: Lipase: 48 U/L (ref 11–51)

## 2020-09-03 LAB — PROTIME-INR
INR: 1.9 — ABNORMAL HIGH (ref 0.8–1.2)
Prothrombin Time: 20.7 seconds — ABNORMAL HIGH (ref 11.4–15.2)

## 2020-09-03 LAB — GLUCOSE, CAPILLARY
Glucose-Capillary: 66 mg/dL — ABNORMAL LOW (ref 70–99)
Glucose-Capillary: 112 mg/dL — ABNORMAL HIGH (ref 70–99)

## 2020-09-03 MED ORDER — SODIUM CHLORIDE 0.9 % IV SOLN
80.0000 mg | Freq: Once | INTRAVENOUS | Status: AC
  Administered 2020-09-03: 80 mg via INTRAVENOUS
  Filled 2020-09-03: qty 80

## 2020-09-03 MED ORDER — SODIUM CHLORIDE 0.9 % IV SOLN: INTRAVENOUS | Status: DC

## 2020-09-03 MED ORDER — IPRATROPIUM-ALBUTEROL 20-100 MCG/ACT IN AERS: 1.0000 | INHALATION_SPRAY | Freq: Four times a day (QID) | RESPIRATORY_TRACT | Status: DC | PRN

## 2020-09-03 MED ORDER — SODIUM CHLORIDE 0.9 % IV SOLN
10.0000 mL/h | Freq: Once | INTRAVENOUS | Status: AC
  Administered 2020-09-03: 10 mL/h via INTRAVENOUS

## 2020-09-03 MED ORDER — INSULIN ASPART 100 UNIT/ML ~~LOC~~ SOLN: 0.0000 [IU] | Freq: Every day | SUBCUTANEOUS | Status: DC

## 2020-09-03 MED ORDER — LEVOTHYROXINE SODIUM 137 MCG PO TABS: 137.0000 ug | ORAL_TABLET | Freq: Every day | ORAL | Status: DC

## 2020-09-03 MED ORDER — INSULIN ASPART 100 UNIT/ML ~~LOC~~ SOLN: 0.0000 [IU] | Freq: Three times a day (TID) | SUBCUTANEOUS | Status: DC

## 2020-09-03 MED ORDER — SODIUM CHLORIDE 0.9 % IV BOLUS
500.0000 mL | Freq: Once | INTRAVENOUS | Status: AC
  Administered 2020-09-03: 500 mL via INTRAVENOUS

## 2020-09-03 MED ORDER — ACETAMINOPHEN 650 MG RE SUPP: 650.0000 mg | Freq: Four times a day (QID) | RECTAL | Status: DC | PRN

## 2020-09-03 MED ORDER — OXYCODONE HCL 5 MG PO TABS: 5.0000 mg | ORAL_TABLET | ORAL | Status: DC | PRN

## 2020-09-03 MED ORDER — ALPRAZOLAM 0.25 MG PO TABS
0.2500 mg | ORAL_TABLET | Freq: Two times a day (BID) | ORAL | Status: DC | PRN
  Administered 2020-09-03: 0.25 mg via ORAL
  Filled 2020-09-03: qty 1

## 2020-09-03 MED ORDER — PANTOPRAZOLE SODIUM 40 MG IV SOLR: 40.0000 mg | Freq: Two times a day (BID) | INTRAVENOUS | Status: DC

## 2020-09-03 MED ORDER — ACETAMINOPHEN 325 MG PO TABS: 650.0000 mg | ORAL_TABLET | Freq: Four times a day (QID) | ORAL | Status: DC | PRN

## 2020-09-03 MED ORDER — ONDANSETRON HCL 4 MG PO TABS: 4.0000 mg | ORAL_TABLET | Freq: Four times a day (QID) | ORAL | Status: DC | PRN

## 2020-09-03 MED ORDER — NICOTINE 21 MG/24HR TD PT24
21.0000 mg | MEDICATED_PATCH | Freq: Once | TRANSDERMAL | Status: AC
  Administered 2020-09-03: 21 mg via TRANSDERMAL
  Filled 2020-09-03: qty 1

## 2020-09-03 MED ORDER — INSULIN DETEMIR 100 UNIT/ML ~~LOC~~ SOLN
15.0000 [IU] | Freq: Every day | SUBCUTANEOUS | Status: DC
  Filled 2020-09-03 (×2): qty 0.15

## 2020-09-03 MED ORDER — PRAVASTATIN SODIUM 10 MG PO TABS
20.0000 mg | ORAL_TABLET | Freq: Every day | ORAL | Status: DC
  Administered 2020-09-03 – 2020-09-04 (×2): 20 mg via ORAL
  Filled 2020-09-03 (×2): qty 2

## 2020-09-03 MED ORDER — NITROGLYCERIN 0.4 MG SL SUBL: 0.4000 mg | SUBLINGUAL_TABLET | SUBLINGUAL | Status: DC | PRN

## 2020-09-03 MED ORDER — ONDANSETRON HCL 4 MG/2ML IJ SOLN: 4.0000 mg | Freq: Four times a day (QID) | INTRAMUSCULAR | Status: DC | PRN

## 2020-09-03 NOTE — ED Notes (Signed)
Patient changed into a hospital gown. Patient had $10 in one dollar bills, cellphone, and a small black key placed in a small clear bag into the patient belongings bag with her clothing. Nichole RN & two techs Lona Six and Tammy verified cash amount.

## 2020-09-03 NOTE — H&P (Signed)
TRH H&P    Patient Demographics:    Tara Flores, is a 85 y.o. female  MRN: [id number]  DOB - 09-21-1935  Admit Date - 09/03/2020  Referring MD/NP/PA: Long  Outpatient Primary MD for the patient is System, Provider Not In  Patient coming from: [facility] complaint- abnormal labs   HPI:    Tara Flores  is a 85 y.o. female with history of TIA, peripheral vascular disease, mild cognitive impairment, hypothyroidism, hypertension, hyperlipidemia, emphysema of lung, diabetic neuropathy, diabetes mellitus type 2, depression, COPD, CHF, CAD, atrial fibrillation, and more presents the ED with a chief complaint of "I was forced to come."  Patient is irritable about the fact that she is in the ER.  Due to this she does not provide much history.  Chart review reveals that patient had outpatient lab work done that showed a low hemoglobin, and she was sent to the ER from the nursing home.  Patient reports before she came in she was in her normal state of health.  She reports her neck her little from the way she was sitting.  When specifically asked she reports she did have some shortness of breath, seemed worse with exertion, better with rest.  No chest pain, no palpitations.  Chart review reveals that she has had melena at the nursing home, but patient reports that she does not see well so she does not know she has had melena or not.  Patient reports no abdominal pain, no nausea, no vomiting.  She reports regular bowel movements.  Patient reports normal appetite, eating and drinking normally at the nursing home.  Patient is on aspirin daily, and Eliquis.  Patient reports that she has had a GI bleed in the past, she cannot remember when but reports it was a long time ago.  She reports that at that time "it was nothing."  Patient has no other complaints at this time.  Patient is a current smoker half a pack per day.  She does  request nicotine patch.  She does not drink alcohol.  She is vaccinated for COVID.  She is DNR.  In the ED Afebrile, heart rate 81-1 40, respiratory rate 13-25, blood pressure 80/47-92/55 maintaining oxygen saturations on room air White blood cell count 10.6, hemoglobin 5.5 (last hemoglobin 10.24 June 2020) AKI with a BUN of 74, creatinine 1.28 Hyperglycemia 281 Lipase 48, INR 1.9 Covid negative FOBT positive 80 mg IV loading Protonix, Protonix continued at 40 mg twice daily 500 mL bolus 2 units of blood ordered GI consulted and will plan to do an upper endoscopy tomorrow Admission requested for further work-up and management of GI bleeding acute symptomatic anemia     Review of systems:    In addition to the HPI above,  No Fever-chills, No Headache, No changes with Vision or hearing, No problems swallowing food or Liquids, No Chest pain, Cough does admit to shortness of Breath, No Abdominal pain, No Nausea or Vomiting, bowel movements are regular, No dysuria, No new skin rashes or bruises, No new  joints pains-aches,  No new weakness, tingling, numbness in any extremity, No recent weight gain or loss, No polyuria, polydypsia or polyphagia, No significant Mental Stressors.  All other systems reviewed and are negative.    Past History of the following :    Past Medical History:  Diagnosis Date  . Acquired absence of left foot (HCC)   . Anemia in chronic kidney disease   . Anxiety   . Arthritis   . Asthma   . Atherosclerotic heart disease   . Atrial fibrillation (HCC)   . CAD (coronary artery disease)   . Cardiac disease   . CHF (congestive heart failure) (HCC)   . CHF (congestive heart failure) (HCC)   . COPD (chronic obstructive pulmonary disease) (HCC)   . Depression   . Diabetes mellitus without complication (HCC)   . Diabetic neuropathy (HCC)   . Emphysema of lung (HCC)   . Hyperlipidemia   . Hypertension   . Hypothyroidism   . Mild cognitive  impairment   . Peripheral vascular disease (HCC)   . TIA (transient ischemic attack)       Past Surgical History:  Procedure Laterality Date  . AMPUTATION Left 10/12/2016   Procedure: TRANSMETATARSAL AMPUTATION LEFT FOOT WITH ACHILLES TENDON LENGTHENING;  Surgeon: Felecia Shelling, DPM;  Location: [facility] OR;  Service: Podiatry;  Laterality: Left;  . APPENDECTOMY    . CARDIAC CATHETERIZATION N/A 02/05/2015   Procedure: Left Heart Cath and Coronary Angiography;  Surgeon: Kathleene Hazel, MD;  Location: [facility] INVASIVE CV LAB;  Service: Cardiovascular;  Laterality: N/A;  . EYE SURGERY  2006   unsure if exact procedure  . HIP FRACTURE SURGERY    . INTRAMEDULLARY (IM) NAIL INTERTROCHANTERIC Right 02/06/2015   Procedure: INTRAMEDULLARY (IM) NAIL RIGHT HIP;  Surgeon: Tarry Kos, MD;  Location: [facility] OR;  Service: Orthopedics;  Laterality: Right;  . IR ANGIOGRAM EXTREMITY LEFT  07/01/2020  . IR FEM POP ART ATHERECT INC PTA MOD SED  07/01/2020  . IR TIB-PERO ART ATHEREC INC PTA MOD SED  07/01/2020  . IR US GUIDE VASC ACCESS LEFT  07/01/2020  . IR US GUIDE VASC ACCESS RIGHT  07/01/2020  . JOINT REPLACEMENT     due to hip fracture      Social History:      Social History   Tobacco Use  . Smoking status: Current Every Day Smoker    Packs/day: 0.00    Years: 63.00    Pack years: 0.00    Types: Cigarettes  . Smokeless tobacco: Never Used  . Tobacco comment: 12 cigarettes daily  Substance Use Topics  . Alcohol use: Not Currently    Alcohol/week: 0.0 standard drinks    Comment: 02/09/2017 "used to have a glass of wine at night; stopped cause I take so many pills"       Family History :     Family History  Problem Relation Age of Onset  . Alcohol abuse Father   . Cancer Mother        lung  . Heart disease Mother   . Diabetes Sister   . Heart disease Sister       Home Medications:   Prior to Admission medications   Medication Sig Start Date End Date Taking? Authorizing Provider   acetaminophen (TYLENOL) 325 MG tablet Take 650 mg by mouth every 4 (four) hours as needed for mild pain.   Yes [provider]  albuterol (VENTOLIN HFA) 108 (90 Base) MCG/ACT inhaler  Inhale 2 puffs into the lungs every 6 (six) hours as needed for wheezing or shortness of breath.   Yes [provider]  ALPRAZolam (XANAX) 0.25 MG tablet Take 1 tablet (0.25 mg total) by mouth 2 (two) times daily as needed for anxiety. 02/16/17  Yes Rodolph Bong, MD  Amino Acids-Protein Hydrolys (FEEDING SUPPLEMENT, PRO-STAT SUGAR FREE 64,) LIQD Take 30 mLs by mouth daily.   Yes [provider]  apixaban (ELIQUIS) 5 MG TABS tablet Take 1 tablet (5 mg total) by mouth 2 (two) times daily. 11/23/16  Yes Marquette Saa, MD  Ascorbic Acid (VITAMIN C PO) Take 500 mg by mouth 2 (two) times daily.   Yes [provider]  aspirin 81 MG chewable tablet Chew 1 tablet (81 mg total) by mouth daily. 07/04/20  Yes Erick Blinks, MD  bisoprolol (ZEBETA) 5 MG tablet Take 1 tablet (5 mg total) by mouth daily. 02/17/17  Yes Rodolph Bong, MD  BYETTA 10 MCG PEN 10 MCG/0.04ML SOPN injection Inject 10 mcg into the skin in the morning and at bedtime. 05/30/20  Yes [provider]  Calcium Carb-Cholecalciferol 500-200 MG-UNIT TABS Take 1 tablet by mouth daily.   Yes [provider]  COMBIVENT RESPIMAT 20-100 MCG/ACT AERS respimat  07/27/19  Yes [provider]  diltiazem (TIAZAC) 240 MG 24 hr capsule Take 120 mg by mouth daily.   Yes [provider]  Emollient (EUCERIN) lotion Apply 1 mL topically as needed for dry skin.   Yes [provider]  furosemide (LASIX) 20 MG tablet Take 3 tablets (60 mg total) by mouth 2 (two) times daily. Patient taking differently: Take 60 mg by mouth 2 (two) times daily. 02/16/17  Yes Rodolph Bong, MD  Infant Care Products (BABY SHAMPOO EX) Apply 1 application topically daily. Apply to eyelids every morning for  irritation   Yes [provider]  insulin lispro (HUMALOG) 100 UNIT/ML injection Inject 10 Units into the skin once.   Yes [provider]  insulin NPH Human (HUMULIN N,NOVOLIN N) 100 UNIT/ML injection Inject 15-30 Units into the skin See admin instructions. 30units in the morning and 15units bedtime   Yes [provider]  isosorbide mononitrate (IMDUR) 30 MG 24 hr tablet Take 0.5 tablets (15 mg total) by mouth daily. 02/17/17  Yes Rodolph Bong, MD  JARDIANCE 10 MG TABS tablet Take 10 mg by mouth daily. 10/05/19  Yes [provider]  Multiple Vitamin (MULTIVITAMIN WITH MINERALS) TABS tablet Take 1 tablet by mouth daily.   Yes [provider]  nitroGLYCERIN (NITROSTAT) 0.4 MG SL tablet Place 1 tablet (0.4 mg total) under the tongue every 5 (five) minutes as needed for chest pain. 07/18/15  Yes Wendall Stade, MD  pravastatin (PRAVACHOL) 20 MG tablet Take 20 mg by mouth daily. 09/27/19  Yes [provider]  spironolactone (ALDACTONE) 25 MG tablet Take 0.5 tablets (12.5 mg total) by mouth 2 (two) times daily. Patient taking differently: Take 25 mg by mouth 2 (two) times daily. 02/16/17  Yes Rodolph Bong, MD  SYNTHROID 137 MCG tablet Take 137 mcg by mouth daily. 09/29/19  Yes [provider]  zinc sulfate 220 (50 Zn) MG capsule Take 220 mg by mouth daily.   Yes [provider]  collagenase (SANTYL) ointment Apply topically 2 (two) times daily. 07/03/20   Erick Blinks, MD  diltiazem (CARDIZEM CD) 120 MG 24 hr capsule Take 120 mg by mouth daily. 07/30/20  [provider]  PROAIR HFA 108 [phone] Base) MCG/ACT inhaler  10/17/19   [provider]  vancomycin (VANCOCIN) 500 MG injection  08/04/20   [provider]     Allergies:     Allergies  Allergen Reactions  . Avelox [Moxifloxacin]   . Brethine [Terbutaline]     Made patient confused  . Keflex [Cephalexin] Nausea Only    Loss of appetite      Physical Exam:   Vitals  Blood pressure 94/81, pulse 90, temperature 97.8 F (36.6 C), temperature source Oral, resp. rate 18, height  (1.575 m), weight 73 kg, SpO2 98 %.  1.  General: Patient lying supine in bed with head of bed elevated, no acute distress  2. Psychiatric: Oriented x3, irritable, initially refusing admission until daughter was able to reason with her, cooperative with exam  3. Neurologic: Cranial nerves II through XII are intact, equal sensation in the upper and lower extremities bilaterally, speech and language are normal, patient at baseline-no acute focal deficits on limited exam  4. HEENMT:  Head is atraumatic, normocephalic, pupils are reactive to light, neck is supple, trachea is midline, mucous membranes are mildly dry  5. Respiratory : Respiratory rate is normal, no accessory muscle use, no increased work of breathing, lungs are clear to auscultation bilaterally without wheezes, rhonchi, rales  6. Cardiovascular : Heart rate is normal, rhythm is irregularly irregular, systolic murmur present, no rubs or gallops  7. Gastrointestinal:  Abdomen is soft, nondistended, nontender to palpation, bowel sounds active  8. Skin:  Venous stasis changes in the right lower extremity, skin is warm dry and intact, no drainage or bruises  9.Musculoskeletal:  Midfoot amputee left lower extremity, no acute deformities, no calf tenderness, peripheral edema present    Data Review:    CBC Recent Labs  Lab 09/03/20 1452  WBC 10.6*  HGB 5.5*  HCT 17.9*  PLT 331  MCV 95.7  MCH 29.4  MCHC 30.7  RDW 15.7*  LYMPHSABS 1.1  MONOABS 0.8  EOSABS 0.1  BASOSABS 0.1   ------------------------------------------------------------------------------------------------------------------  Results for orders placed or performed during the hospital encounter of 09/03/20 (from the past 48 hour(s))  Comprehensive metabolic panel     Status: Abnormal   Collection Time:  09/03/20  2:52 PM  Result Value Ref Range   Sodium 130 (L) 135 - 145 mmol/L   Potassium 3.5 3.5 - 5.1 mmol/L   Chloride 95 (L) 98 - 111 mmol/L   CO2 22 22 - 32 mmol/L   Glucose, Bld 281 (H) 70 - 99 mg/dL    Comment: Glucose reference range applies only to samples taken after fasting for at least 8 hours.   BUN 74 (H) 8 - 23 mg/dL   Creatinine, Ser 1.09 (H) 0.44 - 1.00 mg/dL   Calcium 9.1 8.9 - 60.4 mg/dL   Total Protein 6.8 6.5 - 8.1 g/dL   Albumin 2.8 (L) 3.5 - 5.0 g/dL   AST 25 15 - 41 U/L   ALT 16 0 - 44 U/L   Alkaline Phosphatase 73 38 - 126 U/L   Total Bilirubin 0.5 0.3 - 1.2 mg/dL   GFR, Estimated 42 (L) >60 mL/min    Comment: (NOTE) Calculated using the CKD-EPI Creatinine Equation (2021)    Anion gap 13 5 - 15    Comment: Performed at [facility], [address] [place], [place] [address]  Lipase, blood     Status: None   Collection Time: 09/03/20  2:52 PM  Result Value Ref Range   Lipase 48 11 - 51 U/L    Comment: Performed at [facility], [address] [place], [place] [address]  CBC with Differential     Status: Abnormal   Collection Time: 09/03/20  2:52 PM  Result Value Ref Range   WBC 10.6 (H) 4.0 - 10.5 K/uL   RBC 1.87 (L) 3.87 - 5.11 MIL/uL   Hemoglobin 5.5 (LL) 12.0 - 15.0 g/dL    Comment: REPEATED TO VERIFY THIS CRITICAL RESULT HAS VERIFIED AND BEEN CALLED TO WHITE,M RN BY LAURA BILLINGSLEY ON 03 15 2022 AT 1548, AND HAS BEEN READ BACK.     HCT 17.9 (L) 36.0 - 46.0 %   MCV 95.7 80.0 - 100.0 fL   MCH 29.4 26.0 - 34.0 pg   MCHC 30.7 30.0 - 36.0 g/dL   RDW 00.3 (H) 70.4 - 88.8 %   Platelets 331 150 - 400 K/uL   nRBC 0.3 (H) 0.0 - 0.2 %   Neutrophils Relative % 79 %   Neutro Abs 8.5 (H) 1.7 - 7.7 K/uL   Lymphocytes Relative 10 %   Lymphs Abs 1.1 0.7 - 4.0 K/uL   Monocytes Relative 8 %   Monocytes Absolute 0.8 0.1 - 1.0 K/uL   Eosinophils Relative 1 %   Eosinophils Absolute 0.1 0.0 - 0.5 K/uL   Basophils Relative 1 %   Basophils Absolute 0.1 0.0 -  0.1 K/uL   Immature Granulocytes 1 %   Abs Immature Granulocytes 0.07 0.00 - 0.07 K/uL    Comment: Performed at [facility], [address] [place], [place] [address]  Protime-INR     Status: Abnormal   Collection Time: 09/03/20  2:52 PM  Result Value Ref Range   Prothrombin Time 20.7 (H) 11.4 - 15.2 seconds   INR 1.9 (H) 0.8 - 1.2    Comment: (NOTE) INR goal varies based on device and disease states. Performed at [facility], [address], [place], [place] [address]   Type and screen [facility]     Status: None (Preliminary result)   Collection Time: 09/03/20  2:52 PM  Result Value Ref Range   ABO/RH(D) B POS    Antibody Screen NEG    Sample Expiration 09/06/2020,2359    Unit Number [id number]    Blood Component Type RED CELLS,LR    Unit division 00    Status of Unit ALLOCATED    Transfusion Status OK TO TRANSFUSE    Crossmatch Result Compatible    Unit Number [id number]    Blood Component Type RED CELLS,LR    Unit division 00    Status of Unit ISSUED    Transfusion Status OK TO TRANSFUSE    Crossmatch Result      Compatible Performed at [facility], [address] [place], [place] [address]   Prepare RBC (crossmatch)     Status: None   Collection Time: 09/03/20  2:52 PM  Result Value Ref Range   Order Confirmation      ORDER PROCESSED BY BLOOD BANK Performed at [facility], [address] [place], [place] [address]   POC occult blood, ED     Status: Abnormal   Collection Time: 09/03/20  3:00 PM  Result Value Ref Range   Fecal Occult Bld POSITIVE (A) NEGATIVE  Resp Panel by RT-PCR (Flu A&B, Covid) Nasopharyngeal Swab     Status: None   Collection Time: 09/03/20  3:19 PM   Specimen: Nasopharyngeal  Swab; Nasopharyngeal(NP) swabs in vial transport medium  Result Value Ref Range   SARS Coronavirus 2 by RT PCR NEGATIVE NEGATIVE    Comment: (NOTE) SARS-CoV-2 target nucleic acids are NOT DETECTED.  The SARS-CoV-2 RNA is generally detectable in upper  respiratory specimens during the acute phase of infection. The lowest concentration of SARS-CoV-2 viral copies this assay can detect is 138 copies/mL. A negative result does not preclude SARS-Cov-2 infection and should not be used as the sole basis for treatment or other patient management decisions. A negative result may occur with  improper specimen collection/handling, submission of specimen other than nasopharyngeal swab, presence of viral mutation(s) within the areas targeted by this assay, and inadequate number of viral copies(<138 copies/mL). A negative result must be combined with clinical observations, patient history, and epidemiological information. The expected result is Negative.  Fact Sheet for Patients:  BloggerCourse.comhttps://www.fda.gov/media/152166/download  Fact Sheet for Healthcare Providers:  SeriousBroker.ithttps://www.fda.gov/media/152162/download  This test is no t yet approved or cleared by the [place] FDA and  has been authorized for detection and/or diagnosis of SARS-CoV-2 by FDA under an Emergency Use Authorization (EUA). This EUA will remain  in effect (meaning this test can be used) for the duration of the COVID-19 declaration under Section 564(b)(1) of the Act, 21 U.S.C.section 360bbb-3(b)(1), unless the authorization is terminated  or revoked sooner.       Influenza A by PCR NEGATIVE NEGATIVE   Influenza B by PCR NEGATIVE NEGATIVE    Comment: (NOTE) The Xpert Xpress SARS-CoV-2/FLU/RSV plus assay is intended as an aid in the diagnosis of influenza from Nasopharyngeal swab specimens and should not be used as a sole basis for treatment. Nasal washings and aspirates are unacceptable for Xpert Xpress SARS-CoV-2/FLU/RSV testing.  Fact Sheet for Patients: BloggerCourse.comhttps://www.fda.gov/media/152166/download  Fact Sheet for Healthcare Providers: SeriousBroker.ithttps://www.fda.gov/media/152162/download  This test is not yet approved or cleared by the [place] FDA and has been authorized for  detection and/or diagnosis of SARS-CoV-2 by FDA under an Emergency Use Authorization (EUA). This EUA will remain in effect (meaning this test can be used) for the duration of the COVID-19 declaration under Section 564(b)(1) of the Act, 21 U.S.C. section 360bbb-3(b)(1), unless the authorization is terminated or revoked.  Performed at [facility], [address] [place], [place] [address]     Chemistries  Recent Labs  Lab 09/03/20 1452  NA 130*  K 3.5  CL 95*  CO2 22  GLUCOSE 281*  BUN 74*  CREATININE 1.28*  CALCIUM 9.1  AST 25  ALT 16  ALKPHOS 73  BILITOT 0.5   ------------------------------------------------------------------------------------------------------------------  ------------------------------------------------------------------------------------------------------------------ GFR: Estimated Creatinine Clearance: 31.2 mL/min (A) (by C-G formula based on SCr of 1.28 mg/dL (H)). Liver Function Tests: Recent Labs  Lab 09/03/20 1452  AST 25  ALT 16  ALKPHOS 73  BILITOT 0.5  PROT 6.8  ALBUMIN 2.8*   Recent Labs  Lab 09/03/20 1452  LIPASE 48   No results for input(s): AMMONIA in the last 168 hours. Coagulation Profile: Recent Labs  Lab 09/03/20 1452  INR 1.9*   Cardiac Enzymes: No results for input(s): CKTOTAL, CKMB, CKMBINDEX, TROPONINI in the last 168 hours. BNP (last 3 results) No results for input(s): PROBNP in the last 8760 hours. HbA1C: No results for input(s): HGBA1C in the last 72 hours. CBG: No results for input(s): GLUCAP in the last 168 hours. Lipid Profile: No results for input(s): CHOL, HDL, LDLCALC, TRIG, CHOLHDL, LDLDIRECT in the last 72 hours. Thyroid Function Tests: No results for input(s):  TSH, T4TOTAL, FREET4, T3FREE, THYROIDAB in the last 72 hours. Anemia Panel: No results for input(s): VITAMINB12, FOLATE, FERRITIN, TIBC, IRON, RETICCTPCT in the last 72  hours.  --------------------------------------------------------------------------------------------------------------- Urine analysis:    Component Value Date/Time   COLORURINE YELLOW 02/09/2017 2030   APPEARANCEUR CLEAR 02/09/2017 2030   LABSPEC 1.009 02/09/2017 2030   PHURINE 7.0 02/09/2017 2030   GLUCOSEU NEGATIVE 02/09/2017 2030   HGBUR NEGATIVE 02/09/2017 2030   BILIRUBINUR NEGATIVE 02/09/2017 2030   KETONESUR NEGATIVE 02/09/2017 2030   PROTEINUR 30 (A) 02/09/2017 2030   UROBILINOGEN 0.2 02/05/2015 0528   NITRITE NEGATIVE 02/09/2017 2030   LEUKOCYTESUR MODERATE (A) 02/09/2017 2030      Imaging Results:    No results found.  EKG pending   Assessment & Plan:    Active Problems:   Acute GI bleeding   1. Acute GI bleed 1. Globin in January 2022 was 10 2. Today hemoglobin 5.5 3. Melena reported in chart review 4. FOBT positive 5. GI consulted and plans for upper endoscopy 6. N.p.o. 7. Loaded with 80 mg IV Protonix 8. Continue 40 mg IV Protonix twice daily 9. Type and screen, transfuse 2 units 10. Check CBC every 6 hours 11. Continue to monitor 2. Symptomatic anemia  1. Secondary to above 2. With reported shortness of breath 3. See plan above 3. Moderate protein calorie malnutrition 1. Patient currently n.p.o. for upper endoscopy 2. When patient is able to tolerate p.o., consider dietary supplement, and counseled on nutrient dense food choices 4. AKI 1. Baseline 0.84, today creatinine is 1.28 2. Secondary to acute anemia 3. Continue transfusion, maintenance fluids posttransfusion, trend in a.m. 4. Avoid nephrotoxic agents when possible 5. Diabetes mellitus type 2 with hyperglycemia 1. Patient takes 20 units Novolin at home 2. Continue 15 units long-acting insulin while in the hospital 3. Sliding scale coverage 4. Glucose at time of arrival 281 5. CBG every 8 hours 6. Last hemoglobin A1c was 9.6 7. Continue to monitor 6. Hypotension with diagnosis of  hypertension 1. Hypotension secondary to anemia 2. Patient received 500 mL bolus in the ED which improved pressure to 92/55 3. Continue with transfusion 4. Holds about, diltiazem, Lasix, spironolactone 5. Continue to monitor 7. Thyroid disease 1. Check TSH 2. Continue Synthroid 8. Tobacco abuse 1. Continue nicotine patch   DVT Prophylaxis-  SCDs   AM Labs Ordered, also please review Full Orders  Family Communication: No family at bedside at the time of my exam, daughter presents to the hospital later Code Status: DNR  Admission status: Inpatient :The appropriate admission status for this patient is INPATIENT. Inpatient status is judged to be reasonable and necessary in order to provide the required intensity of service to ensure the patient's safety. The patient's presenting symptoms, physical exam findings, and initial radiographic and laboratory data in the context of their chronic comorbidities is felt to place them at high risk for further clinical deterioration. Furthermore, it is not anticipated that the patient will be medically stable for discharge from the hospital within 2 midnights of admission. The following factors support the admission status of inpatient.     The patient's presenting symptoms include abnormal labs The worrisome physical exam findings include A. fib RVR to 140 The initial radiographic and laboratory data are worrisome because of hemoglobin 5.5, FOBT positive The chronic co-morbidities include diabetes mellitus type 2, peripheral vascular disease, COPD, atrial fibrillation, CHF       * I certify that at the point of admission it is  my clinical judgment that the patient will require inpatient hospital care spanning beyond 2 midnights from the point of admission due to high intensity of service, high risk for further deterioration and high frequency of surveillance required.*  Time spent in minutes : 65   Dalyla Chui B Zierle-Ghosh DO

## 2020-09-03 NOTE — ED Notes (Signed)
Upon arrival pt noted to have black dried stool on her sacral area.  Pts sacral area red and excoriated.  Pt immediately cleansed and purwick applied.  POC hemoccult completed.    Pt also noted to have 1/2 right foot removed bandaged dated 3/14.

## 2020-09-03 NOTE — ED Provider Notes (Signed)
Emergency Department Provider Note   I have reviewed the triage vital signs and the nursing notes.   HISTORY  Chief Complaint Altered Mental Status   HPI Tara Flores is a 85 y.o. female with past medical history reviewed below including atrial fibrillation on Eliquis presents with altered mental status from [facility]  She is awake and alert with me and tells me she has no complaints and does not want to be here.  She was noted by nursing staff at the time of arrival to have melena in her depends which they sent for occult blood which came back positive.  Patient's lab work from [facility] shows hemoglobin less than 6.  Patient denies any abdominal or back pain.  She is not having chest pain or shortness of breath. No radiation of symptoms or modifying factors.    Past Medical History:  Diagnosis Date  . Acquired absence of left foot (HCC)   . Anemia in chronic kidney disease   . Anxiety   . Arthritis   . Asthma   . Atherosclerotic heart disease   . Atrial fibrillation (HCC)   . CAD (coronary artery disease)   . Cardiac disease   . CHF (congestive heart failure) (HCC)   . CHF (congestive heart failure) (HCC)   . COPD (chronic obstructive pulmonary disease) (HCC)   . Depression   . Diabetes mellitus without complication (HCC)   . Diabetic neuropathy (HCC)   . Emphysema of lung (HCC)   . Hyperlipidemia   . Hypertension   . Hypothyroidism   . Mild cognitive impairment   . Peripheral vascular disease (HCC)   . TIA (transient ischemic attack)     Patient Active Problem List   Diagnosis Date Noted  . Acute GI bleeding 09/03/2020  . AKI (acute kidney injury) (HCC) 09/03/2020  . Symptomatic anemia 09/03/2020  . Moderate protein-calorie malnutrition (HCC) 09/03/2020  . PVD (peripheral vascular disease) (HCC)   . Chronic diastolic CHF (congestive heart failure) (HCC) 06/26/2020  . COPD (chronic obstructive pulmonary disease) (HCC) 06/26/2020  . Overweight (BMI  25.0-29.9) 06/26/2020  . Plantar ulcer of left foot, with unspecified severity (HCC)   . Foot osteomyelitis, left (HCC) 06/25/2020  . Tobacco use disorder 02/10/2017  . Pulmonary emphysema (HCC)   . Acute on chronic diastolic (congestive) heart failure (HCC) 02/09/2017  . Type 2 diabetes mellitus with hypoglycemia (HCC) 02/09/2017  . New onset a-fib (HCC) 11/20/2016  . COPD exacerbation (HCC)   . Goals of care, counseling/discussion   . Palliative care by specialist   . TIA (transient ischemic attack) 11/19/2016  . Diabetic foot ulcer (HCC) 10/10/2016  . Hypokalemia 10/10/2016  . Anemia due to chronic kidney disease 10/10/2016  . Cellulitis in diabetic foot (HCC) 10/09/2016  . Venous ulcer of left leg (HCC) 07/28/2016  . Dry skin dermatitis 07/28/2016  . Type 2 diabetes mellitus with hyperglycemia (HCC) 02/06/2015  . Hip fracture (HCC) 02/05/2015  . Closed right hip fracture (HCC) 02/05/2015  . Abnormal EKG 02/05/2015  . Tobacco abuse 02/05/2015  . Essential hypertension 02/05/2015  . Hypothyroidism 02/05/2015  . Coronary artery disease involving native coronary artery of native heart without angina pectoris     Past Surgical History:  Procedure Laterality Date  . AMPUTATION Left 10/12/2016   Procedure: TRANSMETATARSAL AMPUTATION LEFT FOOT WITH ACHILLES TENDON LENGTHENING;  Surgeon: Felecia Shelling, DPM;  Location: [facility] OR;  Service: Podiatry;  Laterality: Left;  . APPENDECTOMY    . CARDIAC CATHETERIZATION  N/A 02/05/2015   Procedure: Left Heart Cath and Coronary Angiography;  Surgeon: Kathleene Hazel, MD;  Location: [facility] INVASIVE CV LAB;  Service: Cardiovascular;  Laterality: N/A;  . EYE SURGERY  2006   unsure if exact procedure  . HIP FRACTURE SURGERY    . INTRAMEDULLARY (IM) NAIL INTERTROCHANTERIC Right 02/06/2015   Procedure: INTRAMEDULLARY (IM) NAIL RIGHT HIP;  Surgeon: Tarry Kos, MD;  Location: [facility] OR;  Service: Orthopedics;  Laterality: Right;  . IR ANGIOGRAM EXTREMITY  LEFT  07/01/2020  . IR FEM POP ART ATHERECT INC PTA MOD SED  07/01/2020  . IR TIB-PERO ART ATHEREC INC PTA MOD SED  07/01/2020  . IR US GUIDE VASC ACCESS LEFT  07/01/2020  . IR US GUIDE VASC ACCESS RIGHT  07/01/2020  . JOINT REPLACEMENT     due to hip fracture    Allergies Avelox [moxifloxacin], Brethine [terbutaline], and Keflex [cephalexin]  Family History  Problem Relation Age of Onset  . Alcohol abuse Father   . Cancer Mother        lung  . Heart disease Mother   . Diabetes Sister   . Heart disease Sister     Social History Social History   Tobacco Use  . Smoking status: Current Every Day Smoker    Packs/day: 0.00    Years: 63.00    Pack years: 0.00    Types: Cigarettes  . Smokeless tobacco: Never Used  . Tobacco comment: 12 cigarettes daily  Vaping Use  . Vaping Use: Never used  Substance Use Topics  . Alcohol use: Not Currently    Alcohol/week: 0.0 standard drinks    Comment: 02/09/2017 "used to have a glass of wine at night; stopped cause I take so many pills"  . Drug use: No    Review of Systems  Constitutional: No fever/chills. Positive weakness.  Eyes: No visual changes. ENT: No sore throat. Cardiovascular: Denies chest pain. Respiratory: Denies shortness of breath. Gastrointestinal: No abdominal pain.  No nausea, no vomiting.  No diarrhea.  No constipation. Genitourinary: Negative for dysuria. Musculoskeletal: Negative for back pain. Skin: Negative for rash. Neurological: Negative for headaches, focal weakness or numbness.  10-point ROS otherwise negative.  ____________________________________________   PHYSICAL EXAM:  VITAL SIGNS: ED Triage Vitals  Enc Vitals Group     BP 09/03/20 1438 (!) 88/53     Pulse Rate 09/03/20 1438 83     Resp 09/03/20 1438 16     Temp --      Temp src --      SpO2 09/03/20 1438 98 %     Weight 09/03/20 1441 160 lb 15 oz (73 kg)     Height 09/03/20 1441 5\' 2"  (1.575 m)   Constitutional: Alert and oriented. Well  appearing and in no acute distress. Eyes: Conjunctivae are normal.  Head: Atraumatic. Nose: No congestion/rhinnorhea. Mouth/Throat: Mucous membranes are moist.   Neck: No stridor.  Cardiovascular: Normal rate, regular rhythm. Good peripheral circulation. Grossly normal heart sounds.   Respiratory: Normal respiratory effort.  No retractions. Lungs CTAB. Gastrointestinal: Soft and nontender. No distention.  Musculoskeletal: No lower extremity tenderness with bilateral trace edema. No gross deformities of extremities. Neurologic:  Normal speech and language. No gross focal neurologic deficits are appreciated.  Skin:  Skin is warm and dry. Wrap to the left foot without surrounding cellulitis.    ____________________________________________   LABS (all labs ordered are listed, but only abnormal results are displayed)  Labs Reviewed  COMPREHENSIVE METABOLIC PANEL -  Abnormal; Notable for the following components:      Result Value   Sodium 130 (*)    Chloride 95 (*)    Glucose, Bld 281 (*)    BUN 74 (*)    Creatinine, Ser 1.28 (*)    Albumin 2.8 (*)    GFR, Estimated 42 (*)    All other components within normal limits  CBC WITH DIFFERENTIAL/PLATELET - Abnormal; Notable for the following components:   WBC 10.6 (*)    RBC 1.87 (*)    Hemoglobin 5.5 (*)    HCT 17.9 (*)    RDW 15.7 (*)    nRBC 0.3 (*)    Neutro Abs 8.5 (*)    All other components within normal limits  PROTIME-INR - Abnormal; Notable for the following components:   Prothrombin Time 20.7 (*)    INR 1.9 (*)    All other components within normal limits  CBC - Abnormal; Notable for the following components:   RBC 2.35 (*)    Hemoglobin 6.6 (*)    HCT 20.9 (*)    RDW 18.5 (*)    nRBC 0.5 (*)    All other components within normal limits  GLUCOSE, CAPILLARY - Abnormal; Notable for the following components:   Glucose-Capillary 66 (*)    All other components within normal limits  POC OCCULT BLOOD, ED - Abnormal;  Notable for the following components:   Fecal Occult Bld POSITIVE (*)    All other components within normal limits  RESP PANEL BY RT-PCR (FLU A&B, COVID) ARPGX2  URINE CULTURE  LIPASE, BLOOD  URINALYSIS, ROUTINE W REFLEX MICROSCOPIC  CBC  COMPREHENSIVE METABOLIC PANEL  MAGNESIUM  TSH  CBC  TYPE AND SCREEN  PREPARE RBC (CROSSMATCH)   ____________________________________________  EKG  Not performed ____________________________________________  RADIOLOGY   None  ____________________________________________   PROCEDURES  Procedure(s) performed:   .Critical Care Performed by: Maia Plan, MD Authorized by: Maia Plan, MD   Critical care provider statement:    Critical care time (minutes):  45   Critical care time was exclusive of:  Separately billable procedures and treating other patients and teaching time   Critical care was necessary to treat or prevent imminent or life-threatening deterioration of the following conditions:  Shock   Critical care was time spent personally by me on the following activities:  Discussions with consultants, evaluation of patient's response to treatment, examination of patient, ordering and performing treatments and interventions, ordering and review of laboratory studies, ordering and review of radiographic studies, pulse oximetry, re-evaluation of patient's condition, obtaining history from patient or surrogate, review of old charts, blood draw for specimens and development of treatment plan with patient or surrogate   I assumed direction of critical care for this patient from another provider in my specialty: no     Care discussed with: admitting provider       ____________________________________________   INITIAL IMPRESSION / ASSESSMENT AND PLAN / ED COURSE  Pertinent labs & imaging results that were available during my care of the patient were reviewed by me and considered in my medical decision making (see chart for  details).   Patient presents to the emergency department with abnormal labs with hemoglobin on paperwork from [facility] showing Hb of 5.7.  She has melena here on exam.  She has mild tachycardia and hypotension on arrival.  Despite her abnormal vital signs patient is awake, alert, and asking to be discharged.  She he does confirm to  me her DNR/DNI status.  She is on Eliquis for atrial fibrillation.  She is okay with receiving blood transfusion and absent order for PRBC transfusion given her hypotension and will repeat her lab work and send type and screen. Will hold on Kcentra with BP improving with gentle IVF.   04:16 PM  Spoke with Dr. Levon Hedgerastaneda with GI.  We reviewed the patient's lab work, presentation, vital signs.  Recommends IV Protonix bolus followed by 40 twice daily IV dosing.  We will start with 2 units of PRBC.  Please keep patient n.p.o. after midnight for possible upper endoscopy tomorrow.   Discussed patient's case with [facility] to request admission. Patient and family (if present) updated with plan. [facility]  I reviewed all nursing notes, vitals, pertinent old records, EKGs, labs, imaging (as available).  ____________________________________________  FINAL CLINICAL IMPRESSION(S) / ED DIAGNOSES  Final diagnoses:  Hypovolemic shock (HCC)  Melena     MEDICATIONS GIVEN DURING THIS VISIT:  Medications  pantoprazole (PROTONIX) injection 40 mg (has no administration in time range)  nitroGLYCERIN (NITROSTAT) SL tablet 0.4 mg (has no administration in time range)  pravastatin (PRAVACHOL) tablet 20 mg (20 mg Oral Given 09/03/20 1835)  ALPRAZolam Prudy Feeler(XANAX) tablet 0.25 mg (0.25 mg Oral Given 09/03/20 2123)  Ipratropium-Albuterol (COMBIVENT) respimat 1 puff (has no administration in time range)  insulin detemir (LEVEMIR) injection 15 Units (0 Units Subcutaneous Hold 09/03/20 2216)  insulin aspart (novoLOG) injection 0-15 Units (has no administration in time range)   insulin aspart (novoLOG) injection 0-5 Units (0 Units Subcutaneous Not Given 09/03/20 2202)  0.9 %  sodium chloride infusion (has no administration in time range)  acetaminophen (TYLENOL) tablet 650 mg (has no administration in time range)    Or  acetaminophen (TYLENOL) suppository 650 mg (has no administration in time range)  oxyCODONE (Oxy IR/ROXICODONE) immediate release tablet 5 mg (has no administration in time range)  ondansetron (ZOFRAN) tablet 4 mg (has no administration in time range)    Or  ondansetron (ZOFRAN) injection 4 mg (has no administration in time range)  nicotine (NICODERM CQ - dosed in mg/24 hours) patch 21 mg (21 mg Transdermal Patch Applied 09/03/20 1827)  levothyroxine (SYNTHROID) tablet 137 mcg (has no administration in time range)  sodium chloride 0.9 % bolus 500 mL (0 mLs Intravenous Stopped 09/03/20 1559)  0.9 %  sodium chloride infusion (10 mL/hr Intravenous New Bag/Given 09/03/20 1559)  pantoprazole (PROTONIX) 80 mg in sodium chloride 0.9 % 100 mL IVPB (0 mg Intravenous Stopped 09/03/20 1738)    Note:  This document was prepared using Dragon voice recognition software and may include unintentional dictation errors.  Alona BeneJoshua Zacharius Funari, MD, [facility] Emergency Medicine    Jayleah Garbers, Arlyss RepressJoshua G, MD 09/03/20 2234

## 2020-09-03 NOTE — ED Triage Notes (Signed)
Per EMS here for altered labs, pt appears to have AMS.

## 2020-09-03 NOTE — ED Notes (Signed)
Pt told this nurse to get 2 dollars out to get her a Diet Dr Reino Kent

## 2020-09-04 ENCOUNTER — Encounter (HOSPITAL_COMMUNITY): Payer: Self-pay | Admitting: Family Medicine

## 2020-09-04 DIAGNOSIS — D649 Anemia, unspecified: Secondary | ICD-10-CM | POA: Diagnosis not present

## 2020-09-04 DIAGNOSIS — E440 Moderate protein-calorie malnutrition: Secondary | ICD-10-CM

## 2020-09-04 DIAGNOSIS — R571 Hypovolemic shock: Secondary | ICD-10-CM | POA: Diagnosis not present

## 2020-09-04 DIAGNOSIS — K922 Gastrointestinal hemorrhage, unspecified: Secondary | ICD-10-CM

## 2020-09-04 DIAGNOSIS — Z515 Encounter for palliative care: Secondary | ICD-10-CM

## 2020-09-04 DIAGNOSIS — I4891 Unspecified atrial fibrillation: Secondary | ICD-10-CM

## 2020-09-04 DIAGNOSIS — N179 Acute kidney failure, unspecified: Secondary | ICD-10-CM | POA: Diagnosis not present

## 2020-09-04 DIAGNOSIS — K921 Melena: Secondary | ICD-10-CM

## 2020-09-04 DIAGNOSIS — Z7189 Other specified counseling: Secondary | ICD-10-CM

## 2020-09-04 LAB — TYPE AND SCREEN
ABO/RH(D): B POS
Antibody Screen: NEGATIVE
Unit division: 0
Unit division: 0

## 2020-09-04 LAB — COMPREHENSIVE METABOLIC PANEL
ALT: 16 U/L (ref 0–44)
AST: 20 U/L (ref 15–41)
Albumin: 2.8 g/dL — ABNORMAL LOW (ref 3.5–5.0)
Alkaline Phosphatase: 71 U/L (ref 38–126)
Anion gap: 11 (ref 5–15)
BUN: 60 mg/dL — ABNORMAL HIGH (ref 8–23)
CO2: 21 mmol/L — ABNORMAL LOW (ref 22–32)
Calcium: 8.7 mg/dL — ABNORMAL LOW (ref 8.9–10.3)
Chloride: 102 mmol/L (ref 98–111)
Creatinine, Ser: 1.07 mg/dL — ABNORMAL HIGH (ref 0.44–1.00)
GFR, Estimated: 52 mL/min — ABNORMAL LOW (ref >60)
Glucose, Bld: 81 mg/dL (ref 70–99)
Potassium: 3.1 mmol/L — ABNORMAL LOW (ref 3.5–5.1)
Sodium: 134 mmol/L — ABNORMAL LOW (ref 135–145)
Total Bilirubin: 0.5 mg/dL (ref 0.3–1.2)
Total Protein: 6.7 g/dL (ref 6.5–8.1)

## 2020-09-04 LAB — MAGNESIUM: Magnesium: 2.6 mg/dL — ABNORMAL HIGH (ref 1.7–2.4)

## 2020-09-04 LAB — BPAM RBC
Blood Product Expiration Date: 202204162359
Blood Product Expiration Date: 202204162359
ISSUE DATE / TIME: 202203151742
ISSUE DATE / TIME: 202203152041
Unit Type and Rh: 1700
Unit Type and Rh: 1700

## 2020-09-04 LAB — URINALYSIS, ROUTINE W REFLEX MICROSCOPIC
Bilirubin Urine: NEGATIVE
Glucose, UA: >=500 mg/dL — AB
Hgb urine dipstick: NEGATIVE
Ketones, ur: NEGATIVE mg/dL
Nitrite: NEGATIVE
Protein, ur: 100 mg/dL — AB
RBC / HPF: >50 RBC/hpf — ABNORMAL HIGH (ref 0–5)
Specific Gravity, Urine: 1.01 (ref 1.005–1.030)
WBC, UA: >50 WBC/hpf — ABNORMAL HIGH (ref 0–5)
pH: 5 (ref 5.0–8.0)

## 2020-09-04 LAB — CBC
HCT: 24.6 % — ABNORMAL LOW (ref 36.0–46.0)
Hemoglobin: 7.8 g/dL — ABNORMAL LOW (ref 12.0–15.0)
MCH: 28.3 pg (ref 26.0–34.0)
MCHC: 31.7 g/dL (ref 30.0–36.0)
MCV: 89.1 fL (ref 80.0–100.0)
Platelets: 297 10*3/uL (ref 150–400)
RBC: 2.76 MIL/uL — ABNORMAL LOW (ref 3.87–5.11)
RDW: 18.5 % — ABNORMAL HIGH (ref 11.5–15.5)
WBC: 8.4 10*3/uL (ref 4.0–10.5)
nRBC: 0.4 % — ABNORMAL HIGH (ref 0.0–0.2)

## 2020-09-04 LAB — GLUCOSE, CAPILLARY
Glucose-Capillary: 105 mg/dL — ABNORMAL HIGH (ref 70–99)
Glucose-Capillary: 275 mg/dL — ABNORMAL HIGH (ref 70–99)

## 2020-09-04 LAB — TSH: TSH: 2.965 u[IU]/mL (ref 0.350–4.500)

## 2020-09-04 SURGERY — ESOPHAGOGASTRODUODENOSCOPY (EGD) WITH PROPOFOL
Anesthesia: Choice

## 2020-09-04 MED ORDER — PANTOPRAZOLE SODIUM 40 MG IV SOLR: 40.0000 mg | Freq: Two times a day (BID) | INTRAVENOUS | Status: DC

## 2020-09-04 MED ORDER — COLLAGENASE 250 UNIT/GM EX OINT
TOPICAL_OINTMENT | Freq: Every day | CUTANEOUS | Status: DC
  Filled 2020-09-04: qty 30

## 2020-09-04 MED ORDER — ALPRAZOLAM 0.25 MG PO TABS: 0.2500 mg | ORAL_TABLET | Freq: Two times a day (BID) | ORAL | 0 refills | Status: AC | PRN

## 2020-09-04 MED ORDER — INSULIN NPH (HUMAN) (ISOPHANE) 100 UNIT/ML ~~LOC~~ SUSP
10.0000 [IU] | SUBCUTANEOUS | 11 refills | Status: AC
Start: 2020-09-04 — End: ?

## 2020-09-04 MED ORDER — SODIUM CHLORIDE 0.9 % IV SOLN
8.0000 mg/h | INTRAVENOUS | Status: DC
  Filled 2020-09-04 (×3): qty 80

## 2020-09-04 MED ORDER — SODIUM CHLORIDE 0.9 % IV SOLN: INTRAVENOUS | Status: DC

## 2020-09-04 MED ORDER — OXYCODONE HCL 5 MG PO TABS: 5.0000 mg | ORAL_TABLET | ORAL | 0 refills | Status: AC | PRN

## 2020-09-04 MED ORDER — CHLORHEXIDINE GLUCONATE CLOTH 2 % EX PADS: 6.0000 | MEDICATED_PAD | Freq: Every day | CUTANEOUS | Status: DC

## 2020-09-04 MED ORDER — FUROSEMIDE 40 MG PO TABS: 40.0000 mg | ORAL_TABLET | Freq: Every day | ORAL | 2 refills | Status: AC

## 2020-09-04 NOTE — Consult Note (Signed)
Consultation Note Date: 09/04/20  Patient Name: Tara Flores  DOB: May 17, 1936  MRN: [id number]  Age / Sex: 85 y.o., female  PCP: System, Provider Not In Referring Physician: No att. providers found  Reason for Consultation: Establishing goals of care  HPI/Patient Profile per H&P: Tara Flores  is a 85 y.o. female with history of TIA, peripheral vascular disease, left foot wound with possible osteomyelitis, mild cognitive impairment, hypothyroidism, hypertension, hyperlipidemia, emphysema of lung, diabetic neuropathy, diabetes mellitus type 2, depression, COPD, CHF, CAD, atrial fibrillation, and more presents the ED with a chief complaint of "I was forced to come."  Patient is irritable about the fact that she is in the ER.  Due to this she does not provide much history.  Chart review reveals that patient had outpatient lab work done that showed a low hemoglobin, and she was sent to the ER from the nursing home.  Patient reports before she came in she was in her normal state of health.  She reports her neck her little from the way she was sitting.  When specifically asked she reports she did have some shortness of breath, seemed worse with exertion, better with rest.  No chest pain, no palpitations.  Chart review reveals that she has had melena at the nursing home, but patient reports that she does not see well so she does not know she has had melena or not.  Patient reports no abdominal pain, no nausea, no vomiting.  She reports regular bowel movements.  Patient reports normal appetite, eating and drinking normally at the nursing home.  Patient is on aspirin daily, and Eliquis.  Patient reports that she has had a GI bleed in the past, she cannot remember when but reports it was a long time ago.  She reports that at that time "it was nothing."  Patient has no other complaints at this time.  Patient is a current smoker  half a pack per day.  She does request nicotine patch.  She does not drink alcohol. She is vaccinated for COVID.  She is DNR. Admission requested for further work-up and management of GI bleeding acute symptomatic anemia. Patient declined invasive GI evaluation for her melena and anemia.  Palliative medicine consultation for goals of care.   Clinical Assessment and Goals of Care:  I have reviewed medical records, discussed with care team, and met with patient and two daughters Tara Flores and Tara Flores at [place]) at bedside to discuss goals of care. Patient awake, alert, oriented and able to participate in discussion. She is getting dressed and tells me she will be discharged today. She denies pain or discomfort.   I introduced Palliative Medicine as specialized medical care for people living with serious illness. It focuses on providing relief from the symptoms and stress of a serious illness. The goal is to improve quality of life for both the patient and the family.  Prior to hospitalization, living at [facility] for approximately 4 years. Baseline, she is able to ambulate with walker and requires some assist with  ADL's. Supportive daughters.  Discussed events leading up to admission and course of hospitalization including diagnoses, interventions, plan of care. Patient confirms her decision against aggressive workup for GI bleeding. Daughters and I made it clear that at this point she is stable, but if she continues to bleed, she will continue to decline and this will lead to the end of her life.   I attempted to elicit values and goals of care important to the patient. Tara Flores is VERY adamant to be discharged back to [facility] today and does not wish to return to the hospital. She shares that she is 85yo and has lived a good life. She speaks of being ready to 'be with Jesus' whenever the time comes. Daughters and I confirmed her understanding of her condition and her wishes for discharge and no further  hospitalization.   Introduced and discussed hospice support at [facility] on discharge, emphasizing hospice philosophy and comfort focused pathway. Patient understands and confirms she would like to start hospice services on discharge. Daughters understand and respect her wishes.   MOST form completed with patient and daughters. Decisions include: DNR/DNI, comfort focused pathway and initiation of hospice services on discharge, ABX for time trial if indicated, NO IVF, and NO feeding tube. Electronic Vynca MOST completed. Copies placed in chart and given to daughters.   Patient is very much at peace with her decision and again speaks of being ready to see Jesus when her time comes. She jokes that she will be buried with a pack of [place] on her chest.    Questions and concerns were addressed.  Hard Choices booklet and PMT contact information given.   **Discussed with Dr. Denton Brick, RN, and SW. Hospice consult placed and patient will discharge this afternoon as requested. No symptom concerns.     SUMMARY OF RECOMMENDATIONS    GOC discussion with patient and two daughters Tara Flores and Tara Flores).   Patient declines aggressive workup for GI bleeding.   Patient is adamant on her wishes for discharge today and that she does not wish to return to the hospital if further decline. Patient is ready to start hospice services at [place] facility on discharge, understanding hospice philosophy and guarded prognosis (especially if ongoing melena and anemia). Daughters understand and respect her wishes.   MOST form completed. Decisions include: DNR/DNI, comfort pathway and initiation of hospice on discharge, ABX for time trial if indicated, NO IVF, NO feeding tube. Electronic Vynca MOST completed.  Hospice referral placed by [facility] team. Plan for discharge today.   Code Status/Advance Care Planning:  DNR/DNI  Symptom Management:   Per attending  Palliative Prophylaxis:   Aspiration, Bowel Regimen, Delirium  Protocol, Frequent Pain Assessment, Oral Care and Turn Reposition  Additional Recommendations (Limitations, Scope, Preferences):  Full Comfort Care  Psycho-social/Spiritual:   Desire for further Chaplaincy support:yes  Additional Recommendations: Caregiving  Support/Resources, Compassionate Wean Education and Education on Hospice  Prognosis:   Poor long-term  Discharge Planning: Back to [facility] with hospice services     Primary Diagnoses: Present on Admission: . Acute GI bleeding . Essential hypertension . Hypothyroidism . Tobacco abuse . Type 2 diabetes mellitus with hyperglycemia ([place]) . (Resolved) Atrial fibrillation with RVR ([place])   I have reviewed the medical record, interviewed the patient and family, and examined the patient. The following aspects are pertinent.  Past Medical History:  Diagnosis Date  . Acquired absence of left foot ([facility])   . Anemia in chronic kidney disease   .  Anxiety   . Arthritis   . Asthma   . Atherosclerotic heart disease   . Atrial fibrillation ([place])   . CAD (coronary artery disease)   . Cardiac disease   . CHF (congestive heart failure) ([place])   . CHF (congestive heart failure) ([place])   . COPD (chronic obstructive pulmonary disease) ([place])   . Depression   . Diabetes mellitus without complication ([place])   . Diabetic neuropathy ([place])   . Emphysema of lung ([place])   . Hyperlipidemia   . Hypertension   . Hypothyroidism   . Mild cognitive impairment   . Peripheral vascular disease ([place])   . TIA (transient ischemic attack)    Social History   Socioeconomic History  . Marital status: Widowed    Spouse name: Not on file  . Number of children: Not on file  . Years of education: Not on file  . Highest education level: Not on file  Occupational History  . Not on file  Tobacco Use  . Smoking status: Current Every Day Smoker    Packs/day: 0.00    Years: 63.00    Pack years: 0.00    Types: Cigarettes  . Smokeless tobacco:  Never Used  . Tobacco comment: 12 cigarettes daily  Vaping Use  . Vaping Use: Never used  Substance and Sexual Activity  . Alcohol use: Not Currently    Alcohol/week: 0.0 standard drinks    Comment: 02/09/2017 "used to have a glass of wine at night; stopped cause I take so many pills"  . Drug use: No  . Sexual activity: Never  Other Topics Concern  . Not on file  Social History Narrative   Diet:      Do you drink/ eat things with caffeine? yes      Marital status:  widowed                             What year were you married ? 1969      Do you live in a house, apartment,assistred living, condo, trailer, etc.)?trailer      Is it one or more stories? no      How many persons live in your home ? none      Do you have any pets in your home ?(please list) 1 dog      Current or past profession: LPN      Do you exercise?  yes                            Type & how often: with PT      Do you have a living will? no      Do you have a DNR form? no                      If not, do you want to discuss one? yes      Do you have signed POA?HPOA forms?   no              If so, please bring to your        appointment      Social Determinants of Health   Financial Resource Strain: Not on file  Food Insecurity: Not on file  Transportation Needs: Not on file  Physical Activity: Not on file  Stress: Not on file  Social Connections: Not on file  Family History  Problem Relation Age of Onset  . Alcohol abuse Father   . Cancer Mother        lung  . Heart disease Mother   . Diabetes Sister   . Heart disease Sister    Scheduled Meds:  Continuous Infusions:  PRN Meds:. Medications Prior to Admission:  Prior to Admission medications   Medication Sig Start Date End Date Taking? Authorizing Provider  acetaminophen (TYLENOL) 325 MG tablet Take 650 mg by mouth every 4 (four) hours as needed for mild pain.   Yes [provider]  albuterol (VENTOLIN HFA) 108 (90 Base)  MCG/ACT inhaler Inhale 2 puffs into the lungs every 6 (six) hours as needed for wheezing or shortness of breath.   Yes [provider]  Amino Acids-Protein Hydrolys (FEEDING SUPPLEMENT, PRO-STAT SUGAR FREE 64,) LIQD Take 30 mLs by mouth daily.   Yes [provider]  apixaban (ELIQUIS) 5 MG TABS tablet Take 1 tablet (5 mg total) by mouth 2 (two) times daily. 11/23/16  Yes Verner Mould, MD  Ascorbic Acid (VITAMIN C PO) Take 500 mg by mouth 2 (two) times daily.   Yes [provider]  aspirin 81 MG chewable tablet Chew 1 tablet (81 mg total) by mouth daily. 07/04/20  Yes Kathie Dike, MD  bisoprolol (ZEBETA) 5 MG tablet Take 1 tablet (5 mg total) by mouth daily. 02/17/17  Yes Eugenie Filler, MD  BYETTA 10 MCG PEN 10 MCG/0.04ML SOPN injection Inject 10 mcg into the skin in the morning and at bedtime. 05/30/20  Yes [provider]  Calcium Carb-Cholecalciferol 500-200 MG-UNIT TABS Take 1 tablet by mouth daily.   Yes [provider]  COMBIVENT RESPIMAT 20-100 MCG/ACT AERS respimat  07/27/19  Yes [provider]  diltiazem (TIAZAC) 240 MG 24 hr capsule Take 120 mg by mouth daily.   Yes [provider]  Emollient (EUCERIN) lotion Apply 1 mL topically as needed for dry skin.   Yes [provider]  Infant Care Products (BABY SHAMPOO EX) Apply 1 application topically daily. Apply to eyelids every morning for irritation   Yes [provider]  insulin lispro (HUMALOG) 100 UNIT/ML injection Inject 10 Units into the skin once.   Yes [provider]  isosorbide mononitrate (IMDUR) 30 MG 24 hr tablet Take 0.5 tablets (15 mg total) by mouth daily. 02/17/17  Yes Eugenie Filler, MD  JARDIANCE 10 MG TABS tablet Take 10 mg by mouth daily. 10/05/19  Yes [provider]  Multiple Vitamin (MULTIVITAMIN WITH MINERALS) TABS tablet Take 1 tablet by mouth daily.   Yes [provider]  nitroGLYCERIN  (NITROSTAT) 0.4 MG SL tablet Place 1 tablet (0.4 mg total) under the tongue every 5 (five) minutes as needed for chest pain. 07/18/15  Yes Josue Hector, MD  pravastatin (PRAVACHOL) 20 MG tablet Take 20 mg by mouth daily. 09/27/19  Yes [provider]  spironolactone (ALDACTONE) 25 MG tablet Take 0.5 tablets (12.5 mg total) by mouth 2 (two) times daily. Patient taking differently: Take 25 mg by mouth 2 (two) times daily. 02/16/17  Yes Eugenie Filler, MD  SYNTHROID 137 MCG tablet Take 137 mcg by mouth daily. 09/29/19  Yes [provider]  zinc sulfate 220 (50 Zn) MG capsule Take 220 mg by mouth daily.   Yes [provider]  ALPRAZolam (XANAX) 0.25 MG tablet Take 1 tablet (0.25 mg total) by mouth 2 (two) times daily as needed for anxiety. 09/04/20  Roxan Hockey, MD  collagenase (SANTYL) ointment Apply topically 2 (two) times daily. 07/03/20   Kathie Dike, MD  diltiazem (CARDIZEM CD) 120 MG 24 hr capsule Take 120 mg by mouth daily. 07/30/20   [provider]  furosemide (LASIX) 40 MG tablet Take 1 tablet (40 mg total) by mouth daily. 09/04/20   Emokpae, Courage, MD  insulin NPH Human (NOVOLIN N) 100 UNIT/ML injection Inject 0.1-0.15 mLs (10-15 Units total) into the skin See admin instructions. 15units in the morning and 10units bedtime 09/04/20   Roxan Hockey, MD  oxyCODONE (OXY IR/ROXICODONE) 5 MG immediate release tablet Take 1 tablet (5 mg total) by mouth every 4 (four) hours as needed for moderate pain. 09/04/20   Roxan Hockey, MD  PROAIR HFA 108 [phone] Base) MCG/ACT inhaler  10/17/19   [provider]  vancomycin (VANCOCIN) 500 MG injection  08/04/20   [provider]   Allergies  Allergen Reactions  . Avelox [Moxifloxacin]   . Brethine [Terbutaline]     Made patient confused  . Keflex [Cephalexin] Nausea Only    Loss of appetite   Review of Systems  Constitutional: Positive for activity change and appetite change.   Physical  Exam Vitals and nursing note reviewed.  Constitutional:      General: She is awake.     Appearance: She is ill-appearing.  HENT:     Head: Normocephalic and atraumatic.  Pulmonary:     Effort: No tachypnea, accessory muscle usage or respiratory distress.  Skin:    General: Skin is warm and dry.     Coloration: Skin is pale.  Neurological:     Mental Status: She is alert and oriented to person, place, and time.  Psychiatric:        Mood and Affect: Mood normal.        Speech: Speech normal.        Behavior: Behavior normal.        Cognition and Memory: Cognition normal.     Vital Signs: BP 112/70   Pulse 85   Temp 98 F (36.7 C)   Resp 18   Ht _0  (1.575 m)   Wt 68.2 kg   SpO2 100%   BMI 27.50 kg/m  Pain Scale: 0-10   Pain Score: 0-No pain   SpO2: SpO2: 100 % O2 Device:SpO2: 100 % O2 Flow Rate: .   IO: Intake/output summary:  No intake or output data in the 24 hours ending 09/06/20 1659  LBM: Last BM Date: 09/04/20 Baseline Weight: Weight: 73 kg Most recent weight: Weight: 68.2 kg     Palliative Assessment/Data: PPS 50%   Flowsheet Rows   Flowsheet Row Most Recent Value  Intake Tab   Referral Department Hospitalist  Unit at Time of Referral Med/Surg Unit  Palliative Care Primary Diagnosis Other (Comment)  Palliative Care Type Return patient Palliative Care  Reason for referral Clarify Goals of Care  Date first seen by Palliative Care 09/04/20  Clinical Assessment   Palliative Performance Scale Score 50%  Psychosocial & Spiritual Assessment   Palliative Care Outcomes   Patient/Family meeting held? Yes  Who was at the meeting? patient, two daughters  Palliative Care Outcomes Counseled regarding hospice, Provided end of life care assistance, Clarified goals of care, Provided psychosocial or spiritual support, Transitioned to hospice, ACP counseling assistance      Time Total: 66 Greater than 50%  of this time was spent counseling and coordinating  care related to the above assessment and  plan.  Signed by:  Ihor Dow, DNP, FNP-C Palliative Medicine Team  Phone: [phone] Fax: [phone]   Please contact Palliative Medicine Team phone at [phone] for questions and concerns.  For individual provider: See Shea Evans

## 2020-09-04 NOTE — Progress Notes (Signed)
Pt refusing lab draws, IV fluids, and telemetry. Education given. MD aware. RN [employer].

## 2020-09-04 NOTE — Progress Notes (Signed)
Patient has $9 in cash in the bed with her. Patient refused for [place] to put cash in safe. Patient educated on policy.

## 2020-09-04 NOTE — TOC Transition Note (Signed)
Transition of Care [facility]) - CM/SW Discharge Note   Patient Details  Name: Tara Flores MRN: [id number] Date of Birth: 18-Jun-1936  Transition of Care [facility]) CM/SW Contact:  Karn Cassis, LCSW Phone Number: 09/04/2020, 4:14 PM   Clinical Narrative:  Pt d/c today and will return to [facility]. Pt and family decided to return with hospice. Referred to [facility]. Melissa at [facility] notified. D/C summary sent on hub. Pt and daughters aware and agreeable. Pt will transport via [facility].  RN given number to call report.     Final next level of care: Skilled Nursing Facility Barriers to Discharge: Barriers Resolved   Patient Goals and CMS Choice Patient states their goals for this hospitalization and ongoing recovery are:: return to [facility] offered to / list presented to : Adult Children  Discharge Placement              Patient chooses bed at: [facility] Patient to be transferred to facility by: [facility] Name of family member notified: Tara Flores, daughter Patient and family notified of of transfer: 09/04/20  Discharge Plan and Services In-house Referral: Clinical Social Work,Hospice / Palliative Care   Post Acute Care Choice: Resumption of Svcs/PTA Provider,Nursing Home          DME Arranged: N/A DME Agency: NA         HH Agency: [facility] Date HH Agency Contacted: 09/04/20 Time HH Agency Contacted: 1614 Representative spoke with at [facility] Agency: [place]  Social Determinants of Health (SDOH) Interventions     Readmission Risk Interventions Readmission Risk Prevention Plan 09/04/2020  Transportation Screening Complete  Medication Review [place]) Complete  HRI or Home Care Consult Complete  SW Recovery Care/Counseling Consult Complete  Palliative Care Screening Complete  Skilled Nursing Facility Complete  Some recent data might be hidden

## 2020-09-04 NOTE — Care Management CC44 (Signed)
Condition Code 44 Documentation Completed  Patient Details  Name: Tara Flores MRN: [id number] Date of Birth: 1936/01/07   Condition Code 44 given:  Yes Patient signature on Condition Code 44 notice:  Yes Documentation of 2 MD's agreement:  Yes Code 44 added to claim:  Yes    Karn Cassis, LCSW 09/04/2020, 4:00 PM

## 2020-09-04 NOTE — Consult Note (Addendum)
WOC Nurse Consult Note: Consult requested for left foot.  Performed remotely after review of the progress notes and photos in the EMR. Pt had a transmetatarsal midfoot amputation performed in 2018. Pt was seen 06/26/20 for the same problem by the surgical team at [facility]; she had osteomyelitis and further amputation was recommended at that time but the patient refused, according to the progress notes.  Wound type: Left plantar foot with full thickness chronic wound; yellow slough with mod amt tan drainage. Dressing procedure/placement/frequency: Pt could benefit from a CT scan to r/o an abscess or osteomyelitis if aggressive plan of care is desired; secure chat message sent to the primary team to inform them of this.  Topical treatment orders provided for bedside nurses to perform as follows to assist with removal of nonviable tissue: Apply Santyl to left foot Q day, then cover with moist gauze and dry 4X4 and kerlex Please re-consult if further assistance is needed.  Thank-you,  Cammie Mcgee MSN, RN, CWOCN, [place], CNS [phone]

## 2020-09-04 NOTE — Consult Note (Signed)
Referring Provider: Shon Hale, MD Primary Care Physician:  System, Provider Not In Primary Gastroenterologist:  Formerly unassigned. Dr. Lionel December  Reason for Consultation:  Anemia, melena  HPI: Tara Flores is a 85 y.o. female, resident of [place], with history of TIA, peripheral vascular disease, mild cognitive impairment, hypothyroidism, hypertension, hyperlipidemia, emphysema, diabetic neuropathy, diabetes, COPD, CHF, CAD, A. fib on Eliquis who presented to the emergency department for abnormal labs.  Outpatient labs had noted low hemoglobin and she was sent to the ER.  Chart review reveals that she had melena at the nursing home.  When she presented she was noted to have black stool in her depends which was heme positive.  Patient is on aspirin and Eliquis daily.  Patient reports prior history of remote GI bleeding. Hemoglobin back in January was 10.3.  On presentation hemoglobin 5.5.  After 1 unit of blood was up to 6.6.  Received a second unit of blood overnight hemoglobin now up to 7.8.  In the ED, blood pressures initially soft.  Today BP 112/70, pulse 85.  Labs today with magnesium 2.6, BUN 60 (down from 74 yesterday), creatinine 1.07, sodium 134 up from 130 yesterday.  Albumin 2.8 otherwise LFTs normal.  Hemoglobin 7.8, hematocrit 24.6, MCV 89.1, platelets 297,000, white blood cell count 8400.  Covid negative in the ED.  Notably she was +2 months ago.  INR yesterday 1.9.  Patient denies any abdominal pain but when I push her right upper abdomen she does report having pain there with me palpating.  She denies nausea or vomiting.  She reports regular bowel movements.  No issues eating or drinking, normal appetite.  Current smoker.  She had COVID 2 months ago.  She reports shortness of breath if she lays flat.  She is on aspirin daily.  She receives Eliquis twice daily, likely received a.m. dose yesterday.  She did not receive a p.m. dose.  No PPI.   Prior to Admission  medications   Medication Sig Start Date End Date Taking? Authorizing Provider  acetaminophen (TYLENOL) 325 MG tablet Take 650 mg by mouth every 4 (four) hours as needed for mild pain.   Yes [provider]  albuterol (VENTOLIN HFA) 108 (90 Base) MCG/ACT inhaler Inhale 2 puffs into the lungs every 6 (six) hours as needed for wheezing or shortness of breath.   Yes [provider]  ALPRAZolam (XANAX) 0.25 MG tablet Take 1 tablet (0.25 mg total) by mouth 2 (two) times daily as needed for anxiety. 02/16/17  Yes Rodolph Bong, MD  Amino Acids-Protein Hydrolys (FEEDING SUPPLEMENT, PRO-STAT SUGAR FREE 64,) LIQD Take 30 mLs by mouth daily.   Yes [provider]  apixaban (ELIQUIS) 5 MG TABS tablet Take 1 tablet (5 mg total) by mouth 2 (two) times daily. 11/23/16  Yes Marquette Saa, MD  Ascorbic Acid (VITAMIN C PO) Take 500 mg by mouth 2 (two) times daily.   Yes [provider]  aspirin 81 MG chewable tablet Chew 1 tablet (81 mg total) by mouth daily. 07/04/20  Yes Erick Blinks, MD  bisoprolol (ZEBETA) 5 MG tablet Take 1 tablet (5 mg total) by mouth daily. 02/17/17  Yes Rodolph Bong, MD  BYETTA 10 MCG PEN 10 MCG/0.04ML SOPN injection Inject 10 mcg into the skin in the morning and at bedtime. 05/30/20  Yes [provider]  Calcium Carb-Cholecalciferol 500-200 MG-UNIT TABS Take 1 tablet by mouth daily.   Yes [provider]  COMBIVENT RESPIMAT 20-100  MCG/ACT AERS respimat  07/27/19  Yes [provider]  diltiazem (TIAZAC) 240 MG 24 hr capsule Take 120 mg by mouth daily.   Yes [provider]  Emollient (EUCERIN) lotion Apply 1 mL topically as needed for dry skin.   Yes [provider]  furosemide (LASIX) 20 MG tablet Take 3 tablets (60 mg total) by mouth 2 (two) times daily. Patient taking differently: Take 60 mg by mouth 2 (two) times daily. 02/16/17  Yes Rodolph Bong, MD  Infant Care Products (BABY SHAMPOO  EX) Apply 1 application topically daily. Apply to eyelids every morning for irritation   Yes [provider]  insulin lispro (HUMALOG) 100 UNIT/ML injection Inject 10 Units into the skin once.   Yes [provider]  insulin NPH Human (HUMULIN N,NOVOLIN N) 100 UNIT/ML injection Inject 15-30 Units into the skin See admin instructions. 30units in the morning and 15units bedtime   Yes [provider]  isosorbide mononitrate (IMDUR) 30 MG 24 hr tablet Take 0.5 tablets (15 mg total) by mouth daily. 02/17/17  Yes Rodolph Bong, MD  JARDIANCE 10 MG TABS tablet Take 10 mg by mouth daily. 10/05/19  Yes [provider]  Multiple Vitamin (MULTIVITAMIN WITH MINERALS) TABS tablet Take 1 tablet by mouth daily.   Yes [provider]  nitroGLYCERIN (NITROSTAT) 0.4 MG SL tablet Place 1 tablet (0.4 mg total) under the tongue every 5 (five) minutes as needed for chest pain. 07/18/15  Yes Wendall Stade, MD  pravastatin (PRAVACHOL) 20 MG tablet Take 20 mg by mouth daily. 09/27/19  Yes [provider]  spironolactone (ALDACTONE) 25 MG tablet Take 0.5 tablets (12.5 mg total) by mouth 2 (two) times daily. Patient taking differently: Take 25 mg by mouth 2 (two) times daily. 02/16/17  Yes Rodolph Bong, MD  SYNTHROID 137 MCG tablet Take 137 mcg by mouth daily. 09/29/19  Yes [provider]  zinc sulfate 220 (50 Zn) MG capsule Take 220 mg by mouth daily.   Yes [provider]  collagenase (SANTYL) ointment Apply topically 2 (two) times daily. 07/03/20   Erick Blinks, MD  diltiazem (CARDIZEM CD) 120 MG 24 hr capsule Take 120 mg by mouth daily. 07/30/20   [provider]  PROAIR HFA 108 [phone] Base) MCG/ACT inhaler  10/17/19   [provider]  vancomycin (VANCOCIN) 500 MG injection  08/04/20   [provider]    Current Facility-Administered Medications  Medication Dose Route Frequency Provider Last Rate Last Admin  . 0.9 %   sodium chloride infusion   Intravenous Continuous Zierle-Ghosh, Asia B, DO 75 mL/hr at 09/03/20 2317 New Bag at 09/03/20 2317  . acetaminophen (TYLENOL) tablet 650 mg  650 mg Oral Q6H PRN Zierle-Ghosh, Asia B, DO       Or  . acetaminophen (TYLENOL) suppository 650 mg  650 mg Rectal Q6H PRN Zierle-Ghosh, Asia B, DO      . ALPRAZolam (XANAX) tablet 0.25 mg  0.25 mg Oral BID PRN Zierle-Ghosh, Asia B, DO   0.25 mg at 09/03/20 2123  . insulin aspart (novoLOG) injection 0-15 Units  0-15 Units Subcutaneous TID WC Zierle-Ghosh, Asia B, DO      . insulin aspart (novoLOG) injection 0-5 Units  0-5 Units Subcutaneous QHS Zierle-Ghosh, Asia B, DO      . insulin detemir (LEVEMIR) injection 15 Units  15 Units Subcutaneous QHS Zierle-Ghosh, Asia B, DO      . Ipratropium-Albuterol (COMBIVENT) respimat 1 puff  1 puff Inhalation Q6H PRN Zierle-Ghosh, Asia B, DO      . [START ON 09/05/2020] levothyroxine (SYNTHROID) tablet 137 mcg  137 mcg Oral Q0600 Royce Macadamia, RPH      . nicotine (NICODERM CQ - dosed in mg/24 hours) patch 21 mg  21 mg Transdermal Once Long, Arlyss Repress, MD   21 mg at 09/03/20 1827  . nitroGLYCERIN (NITROSTAT) SL tablet 0.4 mg  0.4 mg Sublingual Q5 min PRN Zierle-Ghosh, Asia B, DO      . ondansetron (ZOFRAN) tablet 4 mg  4 mg Oral Q6H PRN Zierle-Ghosh, Asia B, DO       Or  . ondansetron (ZOFRAN) injection 4 mg  4 mg Intravenous Q6H PRN Zierle-Ghosh, Asia B, DO      . oxyCODONE (Oxy IR/ROXICODONE) immediate release tablet 5 mg  5 mg Oral Q4H PRN Zierle-Ghosh, Asia B, DO      . [START ON 09/07/2020] pantoprazole (PROTONIX) injection 40 mg  40 mg Intravenous Q12H Zierle-Ghosh, Asia B, DO      . pravastatin (PRAVACHOL) tablet 20 mg  20 mg Oral Daily Zierle-Ghosh, Asia B, DO   20 mg at 09/03/20 1835    Allergies as of 09/03/2020 - Review Complete 09/03/2020  Allergen Reaction Noted  . Avelox [moxifloxacin]  06/25/2020  . Brethine [terbutaline]  10/01/2016  . Keflex [cephalexin] Nausea Only  09/17/2015    Past Medical History:  Diagnosis Date  . Acquired absence of left foot (HCC)   . Anemia in chronic kidney disease   . Anxiety   . Arthritis   . Asthma   . Atherosclerotic heart disease   . Atrial fibrillation (HCC)   . CAD (coronary artery disease)   . Cardiac disease   . CHF (congestive heart failure) (HCC)   . CHF (congestive heart failure) (HCC)   . COPD (chronic obstructive pulmonary disease) (HCC)   . Depression   . Diabetes mellitus without complication (HCC)   . Diabetic neuropathy (HCC)   . Emphysema of lung (HCC)   . Hyperlipidemia   . Hypertension   . Hypothyroidism   . Mild cognitive impairment   . Peripheral vascular disease (HCC)   . TIA (transient ischemic attack)     Past Surgical History:  Procedure Laterality Date  . AMPUTATION Left 10/12/2016   Procedure: TRANSMETATARSAL AMPUTATION LEFT FOOT WITH ACHILLES TENDON LENGTHENING;  Surgeon: Felecia Shelling, DPM;  Location: [facility] OR;  Service: Podiatry;  Laterality: Left;  . APPENDECTOMY    . CARDIAC CATHETERIZATION N/A 02/05/2015   Procedure: Left Heart Cath and Coronary Angiography;  Surgeon: Kathleene Hazel, MD;  Location[facility] INVASIVE CV LAB;  Service: Cardiovascular;  Laterality: N/A;  . EYE SURGERY  2006   unsure if exact procedure  . HIP FRACTURE SURGERY    . INTRAMEDULLARY (IM) NAIL INTERTROCHANTERIC Right 02/06/2015   Procedure: INTRAMEDULLARY (IM) NAIL RIGHT HIP;  Surgeon: Tarry Kos, MD;  Location: [facility] OR;  Service: Orthopedics;  Laterality: Right;  . IR ANGIOGRAM EXTREMITY LEFT  07/01/2020  . IR FEM POP ART ATHERECT INC PTA MOD SED  07/01/2020  . IR TIB-PERO ART ATHEREC INC PTA MOD SED  07/01/2020  . IR US GUIDE VASC ACCESS LEFT  07/01/2020  . IR US GUIDE VASC ACCESS RIGHT  07/01/2020  . JOINT REPLACEMENT     due to hip fracture    Family History  Problem Relation Age of Onset  . Alcohol abuse Father   . Cancer Mother  lung  . Heart disease Mother   . Diabetes Sister   .  Heart disease Sister     Social History   Socioeconomic History  . Marital status: Widowed    Spouse name: Not on file  . Number of children: Not on file  . Years of education: Not on file  . Highest education level: Not on file  Occupational History  . Not on file  Tobacco Use  . Smoking status: Current Every Day Smoker    Packs/day: 0.00    Years: 63.00    Pack years: 0.00    Types: Cigarettes  . Smokeless tobacco: Never Used  . Tobacco comment: 12 cigarettes daily  Vaping Use  . Vaping Use: Never used  Substance and Sexual Activity  . Alcohol use: Not Currently    Alcohol/week: 0.0 standard drinks    Comment: 02/09/2017 "used to have a glass of wine at night; stopped cause I take so many pills"  . Drug use: No  . Sexual activity: Never  Other Topics Concern  . Not on file  Social History Narrative   Diet:      Do you drink/ eat things with caffeine? yes      Marital status:  widowed                             What year were you married ? 1969      Do you live in a house, apartment,assistred living, condo, trailer, etc.)?trailer      Is it one or more stories? no      How many persons live in your home ? none      Do you have any pets in your home ?(please list) 1 dog      Current or past profession: LPN      Do you exercise?  yes                            Type & how often: with PT      Do you have a living will? no      Do you have a DNR form? no                      If not, do you want to discuss one? yes      Do you have signed POA?HPOA forms?   no              If so, please bring to your        appointment      Social Determinants of Health   Financial Resource Strain: Not on file  Food Insecurity: Not on file  Transportation Needs: Not on file  Physical Activity: Not on file  Stress: Not on file  Social Connections: Not on file  Intimate Partner Violence: Not on file     ROS: Question reliability  General: Negative for anorexia, weight  loss, fever, chills, fatigue, weakness. Eyes: Negative for vision changes.  ENT: Negative for hoarseness, difficulty swallowing , nasal congestion. CV: Negative for chest pain, angina, palpitations, dyspnea on exertion, positive peripheral edema.  Respiratory: Negative for dyspnea at rest, dyspnea on exertion, cough, sputum, wheezing.  Shortness of breath with lying flat GI: See history of present illness. GU:  Negative for dysuria, hematuria, urinary incontinence, urinary frequency, nocturnal urination.  MS: Negative for joint pain,  low back pain.  Derm: Negative for rash or itching.  Neuro: Negative for weakness, abnormal sensation, seizure, frequent headaches, memory loss, confusion.  Psych: Negative for anxiety, depression, suicidal ideation, hallucinations.  Endo: Negative for unusual weight change.  Heme: Negative for bruising or bleeding. Allergy: Negative for rash or hives.       Physical Examination: Vital signs in last 24 hours: Temp:  [96.8 F (36 C)-98 F (36.7 C)] 98 F (36.7 C) (03/16 0657) Pulse Rate:  [60-140] 85 (03/16 0657) Resp:  [13-25] 18 (03/16 0657) BP: (80-113)/(47-90) 112/70 (03/16 0657) SpO2:  [93 %-100 %] 100 % (03/16 0657) Weight:  [68.2 kg-73 kg] 68.2 kg (03/15 1939) Last BM Date: 09/03/20  General: Chronically ill-appearing, pale female in no acute distress.  Head: Normocephalic, atraumatic.   Eyes: Conjunctiva pale, no icterus. Mouth: Oropharyngeal mucosa moist and pink , no lesions erythema or exudate. Neck: Supple without thyromegaly, masses, or lymphadenopathy.  Lungs: Clear to auscultation bilaterally.  Heart: Regular rate and rhythm, no murmurs rubs or gallops.  Abdomen: Bowel sounds are normal, nondistended, no hepatosplenomegaly or masses, no abdominal bruits or    hernia , no rebound or guarding.  Mild right upper quadrant tenderness Rectal: Not performed Extremities: Trace bilateral lower extremity edema, left lower extremity amputation  foot, erythema involving both lower extremities right greater than left.  Neuro: Alert and oriented x 4 , grossly normal neurologically.  Skin: Warm and dry, no rash or jaundice.   Psych: Alert and cooperative, normal mood and affect.        Intake/Output from previous day: 03/15 0701 - 03/16 0700 In: 2458.6 [I.V.:1228.6; Blood:630; IV Piggyback:600] Out: -  Intake/Output this shift: No intake/output data recorded.  Lab Results: CBC Recent Labs    09/03/20 1452 09/03/20 2046 09/04/20 0315  WBC 10.6* 8.5 8.4  HGB 5.5* 6.6* 7.8*  HCT 17.9* 20.9* 24.6*  MCV 95.7 88.9 89.1  PLT 331 308 297   BMET Recent Labs    09/03/20 1452 09/04/20 0315  NA 130* 134*  K 3.5 3.1*  CL 95* 102  CO2 22 21*  GLUCOSE 281* 81  BUN 74* 60*  CREATININE 1.28* 1.07*  CALCIUM 9.1 8.7*   LFT Recent Labs    09/03/20 1452 09/04/20 0315  BILITOT 0.5 0.5  ALKPHOS 73 71  AST 25 20  ALT 16 16  PROT 6.8 6.7  ALBUMIN 2.8* 2.8*    Lipase Recent Labs    09/03/20 1452  LIPASE 48    PT/INR Recent Labs    09/03/20 1452  LABPROT 20.7*  INR 1.9*      Imaging Studies: No results found.Pierre.Alas week]   Impression: Pleasant 85 year old female with numerous comorbidities as outlined above presenting from skilled nursing facility for hemoglobin of 5.5 and melena  UGI bleed/profound anemia: Hemoglobin 10 back in January 2022.  Presented to our facility with a hemoglobin of 5.5.  Has received 2 units of packed red blood cells, hemoglobin today 7.8.  BUN initially 74 down to 60 today.  Reported melena at the nursing home and documented since arrival, heme positive.  She is on Eliquis and aspirin, no PPI.  Otherwise patient denies GI complaints but noted to have mild rectal quadrant tenderness on exam.  Differential diagnosis includes gastritis, peptic ulcer disease, AVMs, malignancy, etc.   Patient is refusing "any more tests". I am "ready to go home home". I am 85 and "ready to die, enough is  enough". She refuses EGD. She states  she understands that she can die from bleeding out. I spoke to her daughter, Beryle BeamsBetty Flores. She states her mother has underwent psych evaluation in the past to determine her competency due to chronic history of making poor decisions and she was "deemed competent". Per daughter, she had discussion with mother last night and she advised her that she didn't want treatment. She didn't want to be in hospital, have tests, etc. She has requested palliative consult to help solidify patient goals, etc.   Plan:  1. Transfuse as needed. 2. PPI infusion. 3. Palliative consult.  4. Clear liquid diet.    We would like to thank you for the opportunity to participate in the care of Tara Flores.   LOS: 1 day

## 2020-09-04 NOTE — TOC Initial Note (Signed)
Transition of Care [facility]) - Initial/Assessment Note    Patient Details  Name: Tara Flores MRN: [id number] Date of Birth: 27-Mar-1936  [facility]) CM/SW Contact:    Karn Cassis, LCSW Phone Number: 09/04/2020, 8:49 AM  Clinical Narrative:  Pt admitted due to acute GI bleed. Assessment completed due to high risk readmission score and pt admitted from SNF. LCSW spoke with pt's daughter, Krystine as pt oriented to self and place only per chart. Pt has been a resident at [facility] for several years. Louvina lives in [place] and visits regularly. She states pt wants to [facility] to [facility] at d/c. Teagen shared that pt has said she is not interested in coming to hospital/receiving treatment. Discussed palliative and she requests consult. Lolamae works in palliative care. MD notified of request. Per Melissa at [facility], pt is nursing level of care and okay to return. TOC will continue to follow.                  Expected Discharge Plan: Long Term Nursing Home Barriers to Discharge: Continued Medical Work up   Patient Goals and CMS Choice Patient states their goals for this hospitalization and ongoing recovery are:: return to [facility] offered to / list presented to : Adult Children  Expected Discharge Plan and Services Expected Discharge Plan: Long Term Nursing Home In-house Referral: Clinical Social Work,Hospice / Palliative Care   Post Acute Care Choice: Resumption of Svcs/PTA Provider,Nursing Home Living arrangements for the past 2 months: Skilled Nursing Facility                 DME Arranged: N/A DME Agency: NA                  Prior Living Arrangements/Services Living arrangements for the past 2 months: Skilled Nursing Facility Lives with:: Facility Resident Patient language and need for interpreter reviewed:: Yes Do you feel safe going back to the place where you live?: Yes      Need for Family Participation in Patient Care: Yes  (Comment)     Criminal Activity/Legal Involvement Pertinent to Current Situation/Hospitalization: No - Comment as needed  Activities of Daily Living Home Assistive Devices/Equipment: Wheelchair ADL Screening (condition at time of admission) Patient's cognitive ability adequate to safely complete daily activities?: No Is the patient deaf or have difficulty hearing?: Yes Does the patient have difficulty seeing, even when wearing glasses/contacts?: Yes Does the patient have difficulty concentrating, remembering, or making decisions?: Yes Patient able to express need for assistance with ADLs?: Yes Does the patient have difficulty dressing or bathing?: Yes Independently performs ADLs?: No Communication: Independent Dressing (OT): Independent Grooming: Independent Feeding: Independent Bathing: Needs assistance Is this a change from baseline?: Pre-admission baseline Toileting: Needs assistance Is this a change from baseline?: Pre-admission baseline In/Out Bed: Needs assistance Is this a change from baseline?: Pre-admission baseline Walks in Home: Needs assistance Is this a change from baseline?: Pre-admission baseline Does the patient have difficulty walking or climbing stairs?: Yes Weakness of Legs: Both Weakness of Arms/Hands: None  Permission Sought/Granted Permission sought to share information with : Facility Industrial/product designer granted to share information with : Yes, Verbal Permission Granted     Permission granted to share info w AGENCY: [employer] granted to share info w Relationship: SNF     Emotional Assessment   Attitude/Demeanor/Rapport: Unable to Assess Affect (typically observed): Unable to Assess Orientation: : Oriented to Place,Oriented to  Self Alcohol / Substance Use: Not Applicable Psych Involvement: No (comment)  Admission diagnosis:  Hypovolemic shock (HCC) [R57.1] Melena [K92.1] Acute GI bleeding [K92.2] Patient Active  Problem List   Diagnosis Date Noted  . Acute GI bleeding 09/03/2020  . AKI (acute kidney injury) (HCC) 09/03/2020  . Symptomatic anemia 09/03/2020  . Moderate protein-calorie malnutrition (HCC) 09/03/2020  . PVD (peripheral vascular disease) (HCC)   . Chronic diastolic CHF (congestive heart failure) (HCC) 06/26/2020  . COPD (chronic obstructive pulmonary disease) (HCC) 06/26/2020  . Overweight (BMI 25.0-29.9) 06/26/2020  . Plantar ulcer of left foot, with unspecified severity (HCC)   . Foot osteomyelitis, left (HCC) 06/25/2020  . Tobacco use disorder 02/10/2017  . Pulmonary emphysema (HCC)   . Acute on chronic diastolic (congestive) heart failure (HCC) 02/09/2017  . Type 2 diabetes mellitus with hypoglycemia (HCC) 02/09/2017  . New onset a-fib (HCC) 11/20/2016  . COPD exacerbation (HCC)   . Goals of care, counseling/discussion   . Palliative care by specialist   . TIA (transient ischemic attack) 11/19/2016  . Diabetic foot ulcer (HCC) 10/10/2016  . Hypokalemia 10/10/2016  . Anemia due to chronic kidney disease 10/10/2016  . Cellulitis in diabetic foot (HCC) 10/09/2016  . Venous ulcer of left leg (HCC) 07/28/2016  . Dry skin dermatitis 07/28/2016  . Type 2 diabetes mellitus with hyperglycemia (HCC) 02/06/2015  . Hip fracture (HCC) 02/05/2015  . Closed right hip fracture (HCC) 02/05/2015  . Abnormal EKG 02/05/2015  . Tobacco abuse 02/05/2015  . Essential hypertension 02/05/2015  . Hypothyroidism 02/05/2015  . Coronary artery disease involving native coronary artery of native heart without angina pectoris    PCP:  System, Provider Not In Pharmacy:   [facility] [address]), [place] - [address] [place] ([place]) [place] [address] Phone: [phone] Fax: [phone]  [facility] - , [place] - [address] [place], [address] [place], [address] [place] [address] Phone: [phone] Fax: [phone]     Social  Determinants of Health (SDOH) Interventions    Readmission Risk Interventions Readmission Risk Prevention Plan 09/04/2020  Transportation Screening Complete  Medication Review (RN Care Manager) Complete  HRI or Home Care Consult Complete  SW Recovery Care/Counseling Consult Complete  Palliative Care Screening Complete  Skilled Nursing Facility Complete  Some recent data might be hidden

## 2020-09-04 NOTE — Care Management Obs Status (Signed)
MEDICARE OBSERVATION STATUS NOTIFICATION   Patient Details  Name: Tara Flores MRN: [id number] Date of Birth: 23-Aug-1935   Medicare Observation Status Notification Given:  Yes    Karn Cassis, LCSW 09/04/2020, 4:00 PM

## 2020-09-04 NOTE — [place] FL2 (Signed)
[place] MEDICAID FL2 LEVEL OF CARE SCREENING TOOL     IDENTIFICATION  Patient Name: Tara Flores Birthdate: 08/30/1935 Sex: female Admission Date (Current Location): 09/03/2020  [place] and [place] Number:  Aaron Edelman [id number] Facility and Address:  [facility],  [address] [place] [address]      Provider Number: [id number]  Attending Physician Name and Address:  Shon Hale, MD  Relative Name and Phone Number:       Current Level of Care: Hospital Recommended Level of Care: Nursing Facility Prior Approval Number:    Date Approved/Denied:   PASRR Number:    Discharge Plan: SNF    Current Diagnoses: Patient Active Problem List   Diagnosis Date Noted  . Acute GI bleeding 09/03/2020  . AKI (acute kidney injury) (HCC) 09/03/2020  . Symptomatic anemia 09/03/2020  . Moderate protein-calorie malnutrition (HCC) 09/03/2020  . PVD (peripheral vascular disease) (HCC)   . Chronic diastolic CHF (congestive heart failure) (HCC) 06/26/2020  . COPD (chronic obstructive pulmonary disease) (HCC) 06/26/2020  . Overweight (BMI 25.0-29.9) 06/26/2020  . Plantar ulcer of left foot, with unspecified severity (HCC)   . Foot osteomyelitis, left (HCC) 06/25/2020  . Tobacco use disorder 02/10/2017  . Pulmonary emphysema (HCC)   . Acute on chronic diastolic (congestive) heart failure (HCC) 02/09/2017  . Type 2 diabetes mellitus with hypoglycemia (HCC) 02/09/2017  . New onset a-fib (HCC) 11/20/2016  . COPD exacerbation (HCC)   . Goals of care, counseling/discussion   . Palliative care by specialist   . TIA (transient ischemic attack) 11/19/2016  . Diabetic foot ulcer (HCC) 10/10/2016  . Hypokalemia 10/10/2016  . Anemia due to chronic kidney disease 10/10/2016  . Cellulitis in diabetic foot (HCC) 10/09/2016  . Venous ulcer of left leg (HCC) 07/28/2016  . Dry skin dermatitis 07/28/2016  . Type 2 diabetes mellitus with hyperglycemia (HCC) 02/06/2015  . Hip  fracture (HCC) 02/05/2015  . Closed right hip fracture (HCC) 02/05/2015  . Abnormal EKG 02/05/2015  . Tobacco abuse 02/05/2015  . Essential hypertension 02/05/2015  . Hypothyroidism 02/05/2015  . Coronary artery disease involving native coronary artery of native heart without angina pectoris     Orientation RESPIRATION BLADDER Height & Weight     Self,Place  Normal External catheter Weight: 150 lb 5.7 oz (68.2 kg) Height:  5\' 2"  (157.5 cm)  BEHAVIORAL SYMPTOMS/MOOD NEUROLOGICAL BOWEL NUTRITION STATUS      Incontinent Diet (NPO time specified. See d/c summary for updates.)  AMBULATORY STATUS COMMUNICATION OF NEEDS Skin   Extensive Assist Verbally Surgical wounds,Other (Comment) (cellulitis right leg)                       Personal Care Assistance Level of Assistance  Bathing,Feeding,Dressing Bathing Assistance: Maximum assistance Feeding assistance: Limited assistance       Functional Limitations Info  Sight,Hearing,Speech Sight Info: Impaired Hearing Info: Impaired Speech Info: Adequate    SPECIAL CARE FACTORS FREQUENCY                       Contractures      Additional Factors Info  Psychotropic,Insulin Sliding Scale     Psychotropic Info: Xanax         Current Medications (09/04/2020):  This is the current hospital active medication list Current Facility-Administered Medications  Medication Dose Route Frequency Provider Last Rate Last Admin  . 0.9 %  sodium chloride infusion   Intravenous Continuous Zierle-Ghosh, Asia B, DO 75  mL/hr at 09/03/20 2317 New Bag at 09/03/20 2317  . acetaminophen (TYLENOL) tablet 650 mg  650 mg Oral Q6H PRN Zierle-Ghosh, Asia B, DO       Or  . acetaminophen (TYLENOL) suppository 650 mg  650 mg Rectal Q6H PRN Zierle-Ghosh, Asia B, DO      . ALPRAZolam (XANAX) tablet 0.25 mg  0.25 mg Oral BID PRN Zierle-Ghosh, Asia B, DO   0.25 mg at 09/03/20 2123  . insulin aspart (novoLOG) injection 0-15 Units  0-15 Units Subcutaneous  TID WC Zierle-Ghosh, Asia B, DO      . insulin aspart (novoLOG) injection 0-5 Units  0-5 Units Subcutaneous QHS Zierle-Ghosh, Asia B, DO      . insulin detemir (LEVEMIR) injection 15 Units  15 Units Subcutaneous QHS Zierle-Ghosh, Asia B, DO      . Ipratropium-Albuterol (COMBIVENT) respimat 1 puff  1 puff Inhalation Q6H PRN Zierle-Ghosh, Asia B, DO      . [START ON 09/05/2020] levothyroxine (SYNTHROID) tablet 137 mcg  137 mcg Oral Q0600 Royce Macadamia, RPH      . nicotine (NICODERM CQ - dosed in mg/24 hours) patch 21 mg  21 mg Transdermal Once Long, Arlyss Repress, MD   21 mg at 09/03/20 1827  . nitroGLYCERIN (NITROSTAT) SL tablet 0.4 mg  0.4 mg Sublingual Q5 min PRN Zierle-Ghosh, Asia B, DO      . ondansetron (ZOFRAN) tablet 4 mg  4 mg Oral Q6H PRN Zierle-Ghosh, Asia B, DO       Or  . ondansetron (ZOFRAN) injection 4 mg  4 mg Intravenous Q6H PRN Zierle-Ghosh, Asia B, DO      . oxyCODONE (Oxy IR/ROXICODONE) immediate release tablet 5 mg  5 mg Oral Q4H PRN Zierle-Ghosh, Asia B, DO      . pantoprazole (PROTONIX) 80 mg in sodium chloride 0.9 % 100 mL (0.8 mg/mL) infusion  8 mg/hr Intravenous Continuous Tiffany Kocher, PA-C      . [START ON 09/07/2020] pantoprazole (PROTONIX) injection 40 mg  40 mg Intravenous Q12H Tiffany Kocher, PA-C      . pravastatin (PRAVACHOL) tablet 20 mg  20 mg Oral Daily Zierle-Ghosh, Asia B, DO   20 mg at 09/04/20 6203     Discharge Medications: Please see discharge summary for a list of discharge medications.  Relevant Imaging Results:  Relevant Lab Results:   Additional Information    Karn Cassis, LCSW

## 2020-09-04 NOTE — Progress Notes (Signed)
GOC meeting scheduled with patient and daughter today 3/16 at 1430. Thank you.  NO CHARGE  Vennie Homans, DNP, FNP-C Palliative Medicine Team  Phone: [phone] Fax: [phone]

## 2020-09-04 NOTE — Discharge Summary (Signed)
Tara Flores, is a 85 y.o. female  DOB Jul 24, 1935  MRN [id number].  Admission date:  09/03/2020  Admitting Physician  Dejia Ebron Denton Brick, MD  Discharge Date:  09/04/2020   Primary MD  System, Provider Not In  Recommendations for primary care physician for things to follow:   1)Avoid ibuprofen/Advil/Aleve/Motrin/Goody Powders/Naproxen/BC powders/Meloxicam/Diclofenac/Indomethacin and other Nonsteroidal anti-inflammatory medications as these will make you more likely to bleed and can cause stomach ulcers, can also cause Kidney problems.   2) Eliquis/blood thinner discontinued due to gastrointestinal bleeding  3) you have requested to return to [facility] skilled nursing facility with palliative and hospice care with focus on comfort and with avoidance of aggressive diagnostic or aggressive treatment protocols--- you have completed the MOST form with your goals of care   Admission Diagnosis  Hypovolemic shock ([place]) [R57.1] Melena [K92.1] Acute GI bleeding [K92.2]   Discharge Diagnosis  Hypovolemic shock ([place]) [R57.1] Melena [K92.1] Acute GI bleeding [K92.2]    Principal Problem:   Acute GI bleeding Active Problems:   Symptomatic anemia   Tobacco abuse   Essential hypertension   Hypothyroidism   Type 2 diabetes mellitus with hyperglycemia (HCC)   AKI (acute kidney injury) ([place])   Moderate protein-calorie malnutrition ([place])   Melena      Past Medical History:  Diagnosis Date  . Acquired absence of left foot ([place])   . Anemia in chronic kidney disease   . Anxiety   . Arthritis   . Asthma   . Atherosclerotic heart disease   . Atrial fibrillation ([place])   . CAD (coronary artery disease)   . Cardiac disease   . CHF (congestive heart failure) ([place])   . CHF (congestive heart failure) ([place])   . COPD (chronic obstructive pulmonary disease) ([place])   . Depression   . Diabetes mellitus without  complication ([place])   . Diabetic neuropathy ([place])   . Emphysema of lung ([place])   . Hyperlipidemia   . Hypertension   . Hypothyroidism   . Mild cognitive impairment   . Peripheral vascular disease ([place])   . TIA (transient ischemic attack)     Past Surgical History:  Procedure Laterality Date  . AMPUTATION Left 10/12/2016   Procedure: TRANSMETATARSAL AMPUTATION LEFT FOOT WITH ACHILLES TENDON LENGTHENING;  Surgeon: Edrick Kins, DPM;  Location: [facility];  Service: Podiatry;  Laterality: Left;  . APPENDECTOMY    . CARDIAC CATHETERIZATION N/A 02/05/2015   Procedure: Left Heart Cath and Coronary Angiography;  Surgeon: Burnell Blanks, MD;  Location: [facility];  Service: Cardiovascular;  Laterality: N/A;  . EYE SURGERY  2006   unsure if exact procedure  . HIP FRACTURE SURGERY    . INTRAMEDULLARY (IM) NAIL INTERTROCHANTERIC Right 02/06/2015   Procedure: INTRAMEDULLARY (IM) NAIL RIGHT HIP;  Surgeon: Leandrew Koyanagi, MD;  Location: [facility];  Service: Orthopedics;  Laterality: Right;  . IR ANGIOGRAM EXTREMITY LEFT  07/01/2020  . IR FEM POP ART ATHERECT INC PTA MOD SED  07/01/2020  . IR TIB-PERO  ART ATHEREC INC PTA MOD SED  07/01/2020  . IR US GUIDE VASC ACCESS LEFT  07/01/2020  . IR US GUIDE VASC ACCESS RIGHT  07/01/2020  . JOINT REPLACEMENT     due to hip fracture       HPI  from the history and physical done on the day of admission:    Tara Flores  is a 85 y.o. female with history of TIA, peripheral vascular disease, mild cognitive impairment, hypothyroidism, hypertension, hyperlipidemia, emphysema of lung, diabetic neuropathy, diabetes mellitus type 2, depression, COPD, CHF, CAD, atrial fibrillation, and more presents the ED with a chief complaint of "I was forced to come."  Patient is irritable about the fact that she is in the ER.  Due to this she does not provide much history.  Chart review reveals that patient had outpatient lab work done that showed a low hemoglobin, and she was sent  to the ER from the nursing home.  Patient reports before she came in she was in her normal state of health.  She reports her neck her little from the way she was sitting.  When specifically asked she reports she did have some shortness of breath, seemed worse with exertion, better with rest.  No chest pain, no palpitations.  Chart review reveals that she has had melena at the nursing home, but patient reports that she does not see well so she does not know she has had melena or not.  Patient reports no abdominal pain, no nausea, no vomiting.  She reports regular bowel movements.  Patient reports normal appetite, eating and drinking normally at the nursing home.  Patient is on aspirin daily, and Eliquis.  Patient reports that she has had a GI bleed in the past, she cannot remember when but reports it was a long time ago.  She reports that at that time "it was nothing."  Patient has no other complaints at this time.  Patient is a current smoker half a pack per day.  She does request nicotine patch.  She does not drink alcohol.  She is vaccinated for COVID.  She is DNR.  In the ED Afebrile, heart rate 81-1 40, respiratory rate 13-25, blood pressure 80/47-92/55 maintaining oxygen saturations on room air White blood cell count 10.6, hemoglobin 5.5 (last hemoglobin 10.24 June 2020) AKI with a BUN of 74, creatinine 1.28 Hyperglycemia 281 Lipase 48, INR 1.9 Covid negative FOBT positive 80 mg IV loading Protonix, Protonix continued at 40 mg twice daily 500 mL bolus 2 units of blood ordered GI consulted and will plan to do an upper endoscopy tomorrow Admission requested for further work-up and management of GI bleeding acute symptomatic anemia    Hospital Course:    1)Social/Ethics--palliative care consult appreciated  -Patient and her 2 daughters requested a DNR status --requested to return to [facility] skilled nursing facility with palliative and hospice care with focus on comfort and with  avoidance of aggressive diagnostic or aggressive treatment protocols---  -- completed the MOST form with  goals of care  2) left foot wound with possible osteomyelitis--- patient declines further invasive testing or treatments  3) acute GI bleed with hemoglobin down to 5.5, hemoglobin is up to 7.8 posttransfusion -Unable to restart Eliquis as patient refuses endoluminal evaluation -Very high risk of rebleeding if Eliquis is restarted -Avoid NSAIDs -GI consult appreciated  4) symptomatic anemia--- secondary to #3 above, improved as above #3  5) hypotension--- proved with IV fluids and transfusion of PRBC -BP  medications adjusted  6)DM2-okay to continue insulin therapy  7)AKI--- hyponatremia and hypokalemia----patient received IV fluids and electrolyte replacements -Creatinine is down to 1.0 from 1.28, baseline usually around 0.8  8) hypothyroidism--- stable, TSH 2.9  Discharge Condition: Stable  Follow UP--- palliative and hospice team   Consults obtained -GI consult/palliative care consult  Diet and Activity recommendation:  As advised  Discharge Instructions    Discharge Instructions    Call MD for:  difficulty breathing, headache or visual disturbances   Complete by: As directed    Call MD for:  persistant dizziness or light-headedness   Complete by: As directed    Call MD for:  persistant nausea and vomiting   Complete by: As directed    Call MD for:  severe uncontrolled pain   Complete by: As directed    Call MD for:  temperature >100.4   Complete by: As directed    Diet - low sodium heart healthy   Complete by: As directed    Diet Carb Modified   Complete by: As directed    Discharge instructions   Complete by: As directed    1)Avoid ibuprofen/Advil/Aleve/Motrin/Goody Powders/Naproxen/BC powders/Meloxicam/Diclofenac/Indomethacin and other Nonsteroidal anti-inflammatory medications as these will make you more likely to bleed and can cause stomach ulcers, can also  cause Kidney problems.   2) Eliquis/blood thinner discontinued due to gastrointestinal bleeding  3) you have requested to return to [facility] skilled nursing facility with palliative and hospice care with focus on comfort and with avoidance of aggressive diagnostic or aggressive treatment protocols--- you have completed the MOST form with your goals of care   Discharge wound care:   Complete by: As directed    -As advised   Increase activity slowly   Complete by: As directed         Discharge Medications     Allergies as of 09/04/2020      Reactions   Avelox [moxifloxacin]    Brethine [terbutaline]    Made patient confused   Keflex [cephalexin] Nausea Only   Loss of appetite      Medication List    STOP taking these medications   apixaban 5 MG Tabs tablet Commonly known as: ELIQUIS   aspirin 81 MG chewable tablet   BABY SHAMPOO EX   bisoprolol 5 MG tablet Commonly known as: ZEBETA   Byetta 10 MCG Pen 10 MCG/0.04ML Sopn injection Generic drug: exenatide   Calcium Carb-Cholecalciferol 500-200 MG-UNIT Tabs   diltiazem 120 MG 24 hr capsule Commonly known as: CARDIZEM CD   diltiazem 240 MG 24 hr capsule Commonly known as: TIAZAC   insulin lispro 100 UNIT/ML injection Commonly known as: HUMALOG   isosorbide mononitrate 30 MG 24 hr tablet Commonly known as: IMDUR   multivitamin with minerals Tabs tablet   pravastatin 20 MG tablet Commonly known as: PRAVACHOL   vancomycin 500 MG injection Commonly known as: VANCOCIN   VITAMIN C PO   zinc sulfate 220 (50 Zn) MG capsule     TAKE these medications   acetaminophen 325 MG tablet Commonly known as: TYLENOL Take 650 mg by mouth every 4 (four) hours as needed for mild pain.   albuterol 108 (90 Base) MCG/ACT inhaler Commonly known as: VENTOLIN HFA Inhale 2 puffs into the lungs every 6 (six) hours as needed for wheezing or shortness of breath. What changed: Another medication with the same name was  removed. Continue taking this medication, and follow the directions you see here.   ALPRAZolam 0.25  MG tablet Commonly known as: XANAX Take 1 tablet (0.25 mg total) by mouth 2 (two) times daily as needed for anxiety.   collagenase ointment Commonly known as: SANTYL Apply topically 2 (two) times daily.   Combivent Respimat 20-100 MCG/ACT Aers respimat Generic drug: Ipratropium-Albuterol   eucerin lotion Apply 1 mL topically as needed for dry skin.   feeding supplement (PRO-STAT SUGAR FREE 64) Liqd Take 30 mLs by mouth daily.   furosemide 40 MG tablet Commonly known as: Lasix Take 1 tablet (40 mg total) by mouth daily. What changed:   medication strength  how much to take  when to take this   insulin NPH Human 100 UNIT/ML injection Commonly known as: NOVOLIN N Inject 0.1-0.15 mLs (10-15 Units total) into the skin See admin instructions. 15units in the morning and 10units bedtime What changed:   how much to take  additional instructions   Jardiance 10 MG Tabs tablet Generic drug: empagliflozin Take 10 mg by mouth daily.   nitroGLYCERIN 0.4 MG SL tablet Commonly known as: NITROSTAT Place 1 tablet (0.4 mg total) under the tongue every 5 (five) minutes as needed for chest pain.   oxyCODONE 5 MG immediate release tablet Commonly known as: Oxy IR/ROXICODONE Take 1 tablet (5 mg total) by mouth every 4 (four) hours as needed for moderate pain.   spironolactone 25 MG tablet Commonly known as: ALDACTONE Take 0.5 tablets (12.5 mg total) by mouth 2 (two) times daily. What changed: how much to take   Synthroid 137 MCG tablet Generic drug: levothyroxine Take 137 mcg by mouth daily.            Discharge Care Instructions  (From admission, onward)         Start     Ordered   09/04/20 0000  Discharge wound care:       Comments: -As advised   09/04/20 1556          Major procedures and Radiology Reports - PLEASE review detailed and final reports for all  details, in brief -   No results found.  Micro Results   Recent Results (from the past 240 hour(s))  Resp Panel by RT-PCR (Flu A&B, Covid) Nasopharyngeal Swab     Status: None   Collection Time: 09/03/20  3:19 PM   Specimen: Nasopharyngeal Swab; Nasopharyngeal(NP) swabs in vial transport medium  Result Value Ref Range Status   SARS Coronavirus 2 by RT PCR NEGATIVE NEGATIVE Final    Comment: (NOTE) SARS-CoV-2 target nucleic acids are NOT DETECTED.  The SARS-CoV-2 RNA is generally detectable in upper respiratory specimens during the acute phase of infection. The lowest concentration of SARS-CoV-2 viral copies this assay can detect is 138 copies/mL. A negative result does not preclude SARS-Cov-2 infection and should not be used as the sole basis for treatment or other patient management decisions. A negative result may occur with  improper specimen collection/handling, submission of specimen other than nasopharyngeal swab, presence of viral mutation(s) within the areas targeted by this assay, and inadequate number of viral copies(<138 copies/mL). A negative result must be combined with clinical observations, patient history, and epidemiological information. The expected result is Negative.  Fact Sheet for Patients:  EntrepreneurPulse.com.au  Fact Sheet for Healthcare Providers:  IncredibleEmployment.be  This test is no t yet approved or cleared by the [place] FDA and  has been authorized for detection and/or diagnosis of SARS-CoV-2 by FDA under an Emergency Use Authorization (EUA). This EUA will remain  in effect (meaning this  test can be used) for the duration of the COVID-19 declaration under Section 564(b)(1) of the Act, 21 U.S.C.section 360bbb-3(b)(1), unless the authorization is terminated  or revoked sooner.       Influenza A by PCR NEGATIVE NEGATIVE Final   Influenza B by PCR NEGATIVE NEGATIVE Final    Comment: (NOTE) The  Xpert Xpress SARS-CoV-2/FLU/RSV plus assay is intended as an aid in the diagnosis of influenza from Nasopharyngeal swab specimens and should not be used as a sole basis for treatment. Nasal washings and aspirates are unacceptable for Xpert Xpress SARS-CoV-2/FLU/RSV testing.  Fact Sheet for Patients: EntrepreneurPulse.com.au  Fact Sheet for Healthcare Providers: IncredibleEmployment.be  This test is not yet approved or cleared by the [place] FDA and has been authorized for detection and/or diagnosis of SARS-CoV-2 by FDA under an Emergency Use Authorization (EUA). This EUA will remain in effect (meaning this test can be used) for the duration of the COVID-19 declaration under Section 564(b)(1) of the Act, 21 U.S.C. section 360bbb-3(b)(1), unless the authorization is terminated or revoked.  Performed at [facility], [address] [place], [place] [address]     Today   [place] today has no new complaints, no fevers, no emesis -Palliative care provider met with patient and patient's 2 daughters--- -palliative care consult appreciated  -Patient and her 2 daughters requested a DNR status --requested to return to [facility] skilled nursing facility with palliative and hospice care with focus on comfort and with avoidance of aggressive diagnostic or aggressive treatment protocols---  -- completed the MOST form with  goals of care          Patient has been seen and examined prior to discharge   Objective   Blood pressure 112/70, pulse 85, temperature 98 F (36.7 C), resp. rate 18, height 5' 2"  (1.575 m), weight 68.2 kg, SpO2 100 %.   Intake/Output Summary (Last 24 hours) at 09/04/2020 1605 Last data filed at 09/04/2020 1500 Gross per 24 hour  Intake 2331.51 ml  Output 976 ml  Net 1355.51 ml    Exam Gen:- Awake Alert, no acute distress  HEENT:- .AT, No sclera icterus Neck-Supple Neck,No JVD,.  Lungs-  CTAB ,  good air movement bilaterally = CV- S1, S2 normal, regular Abd-  +ve B.Sounds, Abd Soft, No tenderness,    Extremity/Skin:-Left foot ulcer on the sole with purulent drainage  psych-affect is appropriate, oriented x3, not always cooperative Neuro-no new focal deficits, no tremors    Data Review   CBC w Diff:  Lab Results  Component Value Date   WBC 8.4 09/04/2020   HGB 7.8 (L) 09/04/2020   HGB 11.1 03/07/2015   HCT 24.6 (L) 09/04/2020   HCT 32.2 (L) 11/19/2016   PLT 297 09/04/2020   PLT 332 03/07/2015   LYMPHOPCT 10 09/03/2020   MONOPCT 8 09/03/2020   EOSPCT 1 09/03/2020   BASOPCT 1 09/03/2020    CMP:  Lab Results  Component Value Date   NA 134 (L) 09/04/2020   NA 139 10/17/2015   K 3.1 (L) 09/04/2020   CL 102 09/04/2020   CO2 21 (L) 09/04/2020   BUN 60 (H) 09/04/2020   BUN 16 10/17/2015   CREATININE 1.07 (H) 09/04/2020   CREATININE 0.86 06/08/2016   GLU 123 02/19/2015   PROT 6.7 09/04/2020   ALBUMIN 2.8 (L) 09/04/2020   BILITOT 0.5 09/04/2020   ALKPHOS 71 09/04/2020   AST 20 09/04/2020   ALT 16 09/04/2020  .  Total Discharge time is about 33 minutes  Roxan Hockey M.D on 09/04/2020 at 4:05 PM  Go to [email or web address] -  for contact info  [facility] - Office  [phone]

## 2020-09-04 NOTE — Progress Notes (Signed)
Nsg Discharge Note  Admit Date:  09/03/2020 Discharge date: 09/04/2020   Tara Flores to be D/C'd to [facility] per MD order.  AVS completed. Patient/caregiver able to verbalize understanding.  Discharge Medication: Allergies as of 09/04/2020      Reactions   Avelox [moxifloxacin]    Brethine [terbutaline]    Made patient confused   Keflex [cephalexin] Nausea Only   Loss of appetite      Medication List    STOP taking these medications   apixaban 5 MG Tabs tablet Commonly known as: ELIQUIS   aspirin 81 MG chewable tablet   BABY SHAMPOO EX   bisoprolol 5 MG tablet Commonly known as: ZEBETA   Byetta 10 MCG Pen 10 MCG/0.04ML Sopn injection Generic drug: exenatide   Calcium Carb-Cholecalciferol 500-200 MG-UNIT Tabs   diltiazem 120 MG 24 hr capsule Commonly known as: CARDIZEM CD   diltiazem 240 MG 24 hr capsule Commonly known as: TIAZAC   insulin lispro 100 UNIT/ML injection Commonly known as: HUMALOG   isosorbide mononitrate 30 MG 24 hr tablet Commonly known as: IMDUR   multivitamin with minerals Tabs tablet   pravastatin 20 MG tablet Commonly known as: PRAVACHOL   vancomycin 500 MG injection Commonly known as: VANCOCIN   VITAMIN C PO   zinc sulfate 220 (50 Zn) MG capsule     TAKE these medications   acetaminophen 325 MG tablet Commonly known as: TYLENOL Take 650 mg by mouth every 4 (four) hours as needed for mild pain.   albuterol 108 (90 Base) MCG/ACT inhaler Commonly known as: VENTOLIN HFA Inhale 2 puffs into the lungs every 6 (six) hours as needed for wheezing or shortness of breath. What changed: Another medication with the same name was removed. Continue taking this medication, and follow the directions you see here.   ALPRAZolam 0.25 MG tablet Commonly known as: XANAX Take 1 tablet (0.25 mg total) by mouth 2 (two) times daily as needed for anxiety.   collagenase ointment Commonly known as: SANTYL Apply topically 2 (two) times daily.    Combivent Respimat 20-100 MCG/ACT Aers respimat Generic drug: Ipratropium-Albuterol   eucerin lotion Apply 1 mL topically as needed for dry skin.   feeding supplement (PRO-STAT SUGAR FREE 64) Liqd Take 30 mLs by mouth daily.   furosemide 40 MG tablet Commonly known as: Lasix Take 1 tablet (40 mg total) by mouth daily. What changed:   medication strength  how much to take  when to take this   insulin NPH Human 100 UNIT/ML injection Commonly known as: NOVOLIN N Inject 0.1-0.15 mLs (10-15 Units total) into the skin See admin instructions. 15units in the morning and 10units bedtime What changed:   how much to take  additional instructions   Jardiance 10 MG Tabs tablet Generic drug: empagliflozin Take 10 mg by mouth daily.   nitroGLYCERIN 0.4 MG SL tablet Commonly known as: NITROSTAT Place 1 tablet (0.4 mg total) under the tongue every 5 (five) minutes as needed for chest pain.   oxyCODONE 5 MG immediate release tablet Commonly known as: Oxy IR/ROXICODONE Take 1 tablet (5 mg total) by mouth every 4 (four) hours as needed for moderate pain.   spironolactone 25 MG tablet Commonly known as: ALDACTONE Take 0.5 tablets (12.5 mg total) by mouth 2 (two) times daily. What changed: how much to take   Synthroid 137 MCG tablet Generic drug: levothyroxine Take 137 mcg by mouth daily.            Discharge  Care Instructions  (From admission, onward)         Start     Ordered   09/04/20 0000  Discharge wound care:       Comments: -As advised   09/04/20 1556          Discharge Assessment: Vitals:   09/04/20 0214 09/04/20 0657  BP: 113/75 112/70  Pulse: 73 85  Resp: 20 18  Temp: 97.7 F (36.5 C) 98 F (36.7 C)  SpO2: 95% 100%   Skin clean, dry and intact without evidence of skin break down, no evidence of skin tears noted. IV catheter discontinued intact. Site without signs and symptoms of complications - no redness or edema noted at insertion site, patient  denies c/o pain - only slight tenderness at site.  Dressing with slight pressure applied.  D/c Instructions-Education: Discharge instructions given to patient/family with verbalized understanding. D/c education completed with patient/family including follow up instructions, medication list, d/c activities limitations if indicated, with other d/c instructions as indicated by MD - patient able to verbalize understanding, all questions fully answered. Patient instructed to return to ED, call 911, or call MD for any changes in condition.  Patient escorted via WC, and D/C to [facility] via [employer].  Kizzie Bane, RN 09/04/2020 5:07 PM

## 2020-09-06 DIAGNOSIS — R571 Hypovolemic shock: Secondary | ICD-10-CM

## 2020-09-06 LAB — URINE CULTURE

## 2020-09-10 ENCOUNTER — Encounter (HOSPITAL_BASED_OUTPATIENT_CLINIC_OR_DEPARTMENT_OTHER): Payer: Medicare Other | Admitting: Internal Medicine

## 2020-11-20 DEATH — deceased
